# Patient Record
Sex: Male | Born: 1955 | Race: White | Hispanic: No | Marital: Single | Smoking: Current some day smoker
Health system: Southern US, Community
[De-identification: ages and names within clinical notes are randomized; demographics above are authoritative.]

## PROBLEM LIST (undated history)

## (undated) ENCOUNTER — Emergency Department (HOSPITAL_COMMUNITY): Discharge status: Against Medical Advice | Payer: Medicaid Other

## (undated) ENCOUNTER — Emergency Department (HOSPITAL_COMMUNITY)
Discharge status: Against Medical Advice | Payer: Medicaid Other | Attending: Emergency Medicine | Admitting: Emergency Medicine

## (undated) DIAGNOSIS — F1020 Alcohol dependence, uncomplicated: Secondary | ICD-10-CM

## (undated) DIAGNOSIS — IMO0002 Reserved for concepts with insufficient information to code with codable children: Secondary | ICD-10-CM

## (undated) DIAGNOSIS — F32A Depression, unspecified: Secondary | ICD-10-CM

## (undated) DIAGNOSIS — M869 Osteomyelitis, unspecified: Secondary | ICD-10-CM

## (undated) DIAGNOSIS — G20A1 Parkinson's disease without dyskinesia, without mention of fluctuations: Secondary | ICD-10-CM

## (undated) DIAGNOSIS — B1920 Unspecified viral hepatitis C without hepatic coma: Secondary | ICD-10-CM

## (undated) DIAGNOSIS — I10 Essential (primary) hypertension: Secondary | ICD-10-CM

## (undated) DIAGNOSIS — I2510 Atherosclerotic heart disease of native coronary artery without angina pectoris: Secondary | ICD-10-CM

## (undated) DIAGNOSIS — G8929 Other chronic pain: Secondary | ICD-10-CM

## (undated) DIAGNOSIS — G20 Parkinson's disease: Secondary | ICD-10-CM

## (undated) DIAGNOSIS — F329 Major depressive disorder, single episode, unspecified: Secondary | ICD-10-CM

## (undated) DIAGNOSIS — M726 Necrotizing fasciitis: Secondary | ICD-10-CM

## (undated) DIAGNOSIS — F99 Mental disorder, not otherwise specified: Secondary | ICD-10-CM

## (undated) DIAGNOSIS — M1990 Unspecified osteoarthritis, unspecified site: Secondary | ICD-10-CM

## (undated) DIAGNOSIS — E7800 Pure hypercholesterolemia, unspecified: Secondary | ICD-10-CM

## (undated) DIAGNOSIS — F419 Anxiety disorder, unspecified: Secondary | ICD-10-CM

## (undated) HISTORY — PX: APPENDECTOMY: SHX54

---
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
## 1999-08-14 ENCOUNTER — Emergency Department (HOSPITAL_COMMUNITY): Admission: EM | Admit: 1999-08-14 | Discharge: 1999-08-14 | Payer: Self-pay | Admitting: Emergency Medicine

## 1999-08-14 ENCOUNTER — Encounter: Payer: Self-pay | Admitting: Emergency Medicine

## 2000-06-15 ENCOUNTER — Inpatient Hospital Stay (HOSPITAL_COMMUNITY): Admission: EM | Admit: 2000-06-15 | Discharge: 2000-06-16 | Payer: Self-pay | Admitting: Emergency Medicine

## 2000-06-15 ENCOUNTER — Encounter (INDEPENDENT_AMBULATORY_CARE_PROVIDER_SITE_OTHER): Payer: Self-pay | Admitting: *Deleted

## 2000-06-15 ENCOUNTER — Encounter: Payer: Self-pay | Admitting: Emergency Medicine

## 2000-09-05 ENCOUNTER — Emergency Department (HOSPITAL_COMMUNITY): Admission: EM | Admit: 2000-09-05 | Discharge: 2000-09-05 | Payer: Self-pay | Admitting: Emergency Medicine

## 2000-09-05 ENCOUNTER — Encounter: Payer: Self-pay | Admitting: Emergency Medicine

## 2002-10-29 ENCOUNTER — Emergency Department (HOSPITAL_COMMUNITY): Admission: EM | Admit: 2002-10-29 | Discharge: 2002-10-29 | Payer: Self-pay | Admitting: Emergency Medicine

## 2003-01-19 ENCOUNTER — Emergency Department (HOSPITAL_COMMUNITY): Admission: EM | Admit: 2003-01-19 | Discharge: 2003-01-19 | Payer: Self-pay

## 2005-03-10 ENCOUNTER — Emergency Department (HOSPITAL_COMMUNITY): Admission: EM | Admit: 2005-03-10 | Discharge: 2005-03-10 | Payer: Self-pay | Admitting: Emergency Medicine

## 2005-03-12 ENCOUNTER — Ambulatory Visit: Payer: Self-pay | Admitting: *Deleted

## 2005-03-12 ENCOUNTER — Ambulatory Visit: Payer: Self-pay | Admitting: Nurse Practitioner

## 2005-03-26 ENCOUNTER — Ambulatory Visit: Payer: Self-pay | Admitting: Nurse Practitioner

## 2005-08-02 ENCOUNTER — Emergency Department (HOSPITAL_COMMUNITY): Admission: EM | Admit: 2005-08-02 | Discharge: 2005-08-02 | Payer: Self-pay | Admitting: Emergency Medicine

## 2005-12-21 ENCOUNTER — Emergency Department (HOSPITAL_COMMUNITY): Admission: EM | Admit: 2005-12-21 | Discharge: 2005-12-21 | Payer: Self-pay | Admitting: Emergency Medicine

## 2005-12-30 ENCOUNTER — Emergency Department (HOSPITAL_COMMUNITY): Admission: EM | Admit: 2005-12-30 | Discharge: 2005-12-30 | Payer: Self-pay | Admitting: Emergency Medicine

## 2005-12-31 ENCOUNTER — Ambulatory Visit: Payer: Self-pay | Admitting: Nurse Practitioner

## 2006-01-12 ENCOUNTER — Ambulatory Visit: Payer: Self-pay | Admitting: Nurse Practitioner

## 2006-01-21 ENCOUNTER — Ambulatory Visit (HOSPITAL_COMMUNITY): Admission: RE | Admit: 2006-01-21 | Discharge: 2006-01-21 | Payer: Self-pay | Admitting: Internal Medicine

## 2006-01-25 ENCOUNTER — Ambulatory Visit: Payer: Self-pay | Admitting: Nurse Practitioner

## 2006-02-10 ENCOUNTER — Emergency Department (HOSPITAL_COMMUNITY): Admission: EM | Admit: 2006-02-10 | Discharge: 2006-02-10 | Payer: Self-pay | Admitting: Emergency Medicine

## 2006-02-23 ENCOUNTER — Ambulatory Visit: Payer: Self-pay | Admitting: Nurse Practitioner

## 2006-03-21 ENCOUNTER — Ambulatory Visit: Payer: Self-pay | Admitting: Nurse Practitioner

## 2006-03-31 ENCOUNTER — Ambulatory Visit: Payer: Self-pay | Admitting: Nurse Practitioner

## 2006-05-02 ENCOUNTER — Ambulatory Visit: Payer: Self-pay | Admitting: Nurse Practitioner

## 2006-06-01 ENCOUNTER — Ambulatory Visit: Payer: Self-pay | Admitting: Nurse Practitioner

## 2006-07-05 ENCOUNTER — Ambulatory Visit: Payer: Self-pay | Admitting: Nurse Practitioner

## 2006-07-17 ENCOUNTER — Emergency Department (HOSPITAL_COMMUNITY): Admission: EM | Admit: 2006-07-17 | Discharge: 2006-07-17 | Payer: Self-pay | Admitting: Emergency Medicine

## 2006-08-01 ENCOUNTER — Ambulatory Visit: Payer: Self-pay | Admitting: Nurse Practitioner

## 2006-08-05 ENCOUNTER — Emergency Department (HOSPITAL_COMMUNITY): Admission: EM | Admit: 2006-08-05 | Discharge: 2006-08-05 | Payer: Self-pay | Admitting: Emergency Medicine

## 2006-09-23 ENCOUNTER — Ambulatory Visit: Payer: Self-pay | Admitting: Nurse Practitioner

## 2006-10-07 ENCOUNTER — Emergency Department (HOSPITAL_COMMUNITY): Admission: EM | Admit: 2006-10-07 | Discharge: 2006-10-07 | Payer: Self-pay | Admitting: Emergency Medicine

## 2006-10-08 ENCOUNTER — Emergency Department (HOSPITAL_COMMUNITY): Admission: EM | Admit: 2006-10-08 | Discharge: 2006-10-08 | Payer: Self-pay | Admitting: Emergency Medicine

## 2006-10-21 ENCOUNTER — Ambulatory Visit: Payer: Self-pay | Admitting: Nurse Practitioner

## 2006-10-30 ENCOUNTER — Emergency Department (HOSPITAL_COMMUNITY): Admission: EM | Admit: 2006-10-30 | Discharge: 2006-10-30 | Payer: Self-pay | Admitting: Emergency Medicine

## 2006-10-31 ENCOUNTER — Emergency Department (HOSPITAL_COMMUNITY): Admission: EM | Admit: 2006-10-31 | Discharge: 2006-10-31 | Payer: Self-pay | Admitting: Emergency Medicine

## 2006-11-03 ENCOUNTER — Ambulatory Visit: Payer: Self-pay | Admitting: Nurse Practitioner

## 2006-12-13 ENCOUNTER — Emergency Department (HOSPITAL_COMMUNITY): Admission: EM | Admit: 2006-12-13 | Discharge: 2006-12-13 | Payer: Self-pay | Admitting: Emergency Medicine

## 2006-12-21 ENCOUNTER — Ambulatory Visit: Payer: Self-pay | Admitting: Nurse Practitioner

## 2007-02-20 ENCOUNTER — Ambulatory Visit: Payer: Self-pay | Admitting: Internal Medicine

## 2007-03-08 ENCOUNTER — Ambulatory Visit: Payer: Self-pay | Admitting: Internal Medicine

## 2007-04-04 ENCOUNTER — Encounter (INDEPENDENT_AMBULATORY_CARE_PROVIDER_SITE_OTHER): Payer: Self-pay | Admitting: Nurse Practitioner

## 2007-04-04 ENCOUNTER — Ambulatory Visit: Payer: Self-pay | Admitting: Internal Medicine

## 2007-04-04 LAB — CONVERTED CEMR LAB
ALT: 44 units/L (ref 0–53)
AST: 147 units/L — ABNORMAL HIGH (ref 0–37)
Alkaline Phosphatase: 156 units/L — ABNORMAL HIGH (ref 39–117)
Chloride: 106 meq/L (ref 96–112)
Creatinine, Ser: 0.85 mg/dL (ref 0.40–1.50)
Total Bilirubin: 1.4 mg/dL — ABNORMAL HIGH (ref 0.3–1.2)

## 2007-04-16 ENCOUNTER — Emergency Department (HOSPITAL_COMMUNITY): Admission: EM | Admit: 2007-04-16 | Discharge: 2007-04-16 | Payer: Self-pay | Admitting: Emergency Medicine

## 2007-05-13 ENCOUNTER — Emergency Department (HOSPITAL_COMMUNITY): Admission: EM | Admit: 2007-05-13 | Discharge: 2007-05-14 | Payer: Self-pay | Admitting: Emergency Medicine

## 2007-05-24 ENCOUNTER — Encounter (INDEPENDENT_AMBULATORY_CARE_PROVIDER_SITE_OTHER): Payer: Self-pay | Admitting: *Deleted

## 2007-05-30 ENCOUNTER — Ambulatory Visit: Payer: Self-pay | Admitting: Internal Medicine

## 2007-06-29 ENCOUNTER — Encounter (INDEPENDENT_AMBULATORY_CARE_PROVIDER_SITE_OTHER): Payer: Self-pay | Admitting: Nurse Practitioner

## 2007-06-29 ENCOUNTER — Ambulatory Visit: Payer: Self-pay | Admitting: Internal Medicine

## 2007-06-29 LAB — CONVERTED CEMR LAB
Amphetamine Screen, Ur: NEGATIVE
Barbiturate Quant, Ur: NEGATIVE
Marijuana Metabolite: NEGATIVE
Methadone: NEGATIVE
Opiate Screen, Urine: NEGATIVE

## 2007-07-04 ENCOUNTER — Emergency Department (HOSPITAL_COMMUNITY): Admission: EM | Admit: 2007-07-04 | Discharge: 2007-07-04 | Payer: Self-pay | Admitting: Emergency Medicine

## 2007-07-05 ENCOUNTER — Emergency Department (HOSPITAL_COMMUNITY): Admission: EM | Admit: 2007-07-05 | Discharge: 2007-07-05 | Payer: Self-pay | Admitting: Emergency Medicine

## 2007-08-30 ENCOUNTER — Emergency Department (HOSPITAL_COMMUNITY): Admission: EM | Admit: 2007-08-30 | Discharge: 2007-08-31 | Payer: Self-pay | Admitting: Emergency Medicine

## 2007-09-05 ENCOUNTER — Emergency Department (HOSPITAL_COMMUNITY): Admission: EM | Admit: 2007-09-05 | Discharge: 2007-09-05 | Payer: Self-pay | Admitting: Emergency Medicine

## 2007-09-06 ENCOUNTER — Inpatient Hospital Stay (HOSPITAL_COMMUNITY): Admission: EM | Admit: 2007-09-06 | Discharge: 2007-09-25 | Payer: Self-pay | Admitting: Emergency Medicine

## 2007-09-26 ENCOUNTER — Emergency Department (HOSPITAL_COMMUNITY): Admission: EM | Admit: 2007-09-26 | Discharge: 2007-09-26 | Payer: Self-pay | Admitting: Emergency Medicine

## 2007-09-28 ENCOUNTER — Emergency Department (HOSPITAL_COMMUNITY): Admission: EM | Admit: 2007-09-28 | Discharge: 2007-09-28 | Payer: Self-pay | Admitting: *Deleted

## 2007-10-04 ENCOUNTER — Ambulatory Visit: Payer: Self-pay | Admitting: Family Medicine

## 2007-11-01 ENCOUNTER — Ambulatory Visit: Payer: Self-pay | Admitting: Internal Medicine

## 2008-04-24 ENCOUNTER — Ambulatory Visit: Payer: Self-pay | Admitting: Internal Medicine

## 2008-04-24 LAB — CONVERTED CEMR LAB
AST: 134 units/L — ABNORMAL HIGH (ref 0–37)
Albumin: 3.1 g/dL — ABNORMAL LOW (ref 3.5–5.2)
Alkaline Phosphatase: 175 units/L — ABNORMAL HIGH (ref 39–117)
BUN: 13 mg/dL (ref 6–23)
Basophils Relative: 1 % (ref 0–1)
Eosinophils Absolute: 0.1 10*3/uL (ref 0.0–0.7)
Eosinophils Relative: 2 % (ref 0–5)
MCHC: 32.7 g/dL (ref 30.0–36.0)
MCV: 98.4 fL (ref 78.0–100.0)
Neutrophils Relative %: 45 % (ref 43–77)
Platelets: 72 10*3/uL — ABNORMAL LOW (ref 150–400)
Potassium: 3.8 meq/L (ref 3.5–5.3)
Sodium: 140 meq/L (ref 135–145)
Total Bilirubin: 1.2 mg/dL (ref 0.3–1.2)

## 2008-06-14 ENCOUNTER — Ambulatory Visit: Payer: Self-pay | Admitting: Internal Medicine

## 2008-06-14 LAB — CONVERTED CEMR LAB
Amphetamine Screen, Ur: NEGATIVE
Marijuana Metabolite: NEGATIVE
Opiate Screen, Urine: NEGATIVE
Phencyclidine (PCP): NEGATIVE
Propoxyphene: NEGATIVE

## 2008-06-15 ENCOUNTER — Encounter (INDEPENDENT_AMBULATORY_CARE_PROVIDER_SITE_OTHER): Payer: Self-pay | Admitting: Internal Medicine

## 2008-11-04 ENCOUNTER — Emergency Department (HOSPITAL_COMMUNITY): Admission: EM | Admit: 2008-11-04 | Discharge: 2008-11-04 | Payer: Self-pay | Admitting: *Deleted

## 2008-12-05 ENCOUNTER — Emergency Department (HOSPITAL_COMMUNITY): Admission: EM | Admit: 2008-12-05 | Discharge: 2008-12-06 | Payer: Self-pay | Admitting: Emergency Medicine

## 2008-12-22 ENCOUNTER — Emergency Department (HOSPITAL_COMMUNITY): Admission: EM | Admit: 2008-12-22 | Discharge: 2008-12-23 | Payer: Self-pay | Admitting: *Deleted

## 2008-12-23 ENCOUNTER — Ambulatory Visit: Payer: Self-pay | Admitting: Psychiatry

## 2008-12-23 ENCOUNTER — Inpatient Hospital Stay (HOSPITAL_COMMUNITY): Admission: EM | Admit: 2008-12-23 | Discharge: 2008-12-31 | Payer: Self-pay | Admitting: Psychiatry

## 2008-12-27 ENCOUNTER — Emergency Department (HOSPITAL_COMMUNITY): Admission: EM | Admit: 2008-12-27 | Discharge: 2008-12-27 | Payer: Self-pay | Admitting: Emergency Medicine

## 2009-01-02 ENCOUNTER — Emergency Department (HOSPITAL_COMMUNITY): Admission: EM | Admit: 2009-01-02 | Discharge: 2009-01-02 | Payer: Self-pay | Admitting: Emergency Medicine

## 2009-01-02 ENCOUNTER — Inpatient Hospital Stay (HOSPITAL_COMMUNITY): Admission: RE | Admit: 2009-01-02 | Discharge: 2009-01-08 | Payer: Self-pay | Admitting: Psychiatry

## 2009-01-08 ENCOUNTER — Emergency Department (HOSPITAL_COMMUNITY): Admission: EM | Admit: 2009-01-08 | Discharge: 2009-01-09 | Payer: Self-pay | Admitting: Emergency Medicine

## 2009-01-09 ENCOUNTER — Inpatient Hospital Stay (HOSPITAL_COMMUNITY): Admission: RE | Admit: 2009-01-09 | Discharge: 2009-01-15 | Payer: Self-pay | Admitting: Psychiatry

## 2009-01-24 ENCOUNTER — Emergency Department (HOSPITAL_COMMUNITY): Admission: EM | Admit: 2009-01-24 | Discharge: 2009-01-24 | Payer: Self-pay | Admitting: Emergency Medicine

## 2009-01-31 ENCOUNTER — Emergency Department (HOSPITAL_COMMUNITY): Admission: EM | Admit: 2009-01-31 | Discharge: 2009-02-04 | Payer: Self-pay | Admitting: Emergency Medicine

## 2009-02-04 ENCOUNTER — Inpatient Hospital Stay (HOSPITAL_COMMUNITY): Admission: AD | Admit: 2009-02-04 | Discharge: 2009-02-13 | Payer: Self-pay | Admitting: Psychiatry

## 2009-02-04 ENCOUNTER — Ambulatory Visit: Payer: Self-pay | Admitting: Psychiatry

## 2009-02-08 ENCOUNTER — Emergency Department (HOSPITAL_COMMUNITY): Admission: EM | Admit: 2009-02-08 | Discharge: 2009-02-08 | Payer: Self-pay | Admitting: Emergency Medicine

## 2009-02-18 ENCOUNTER — Emergency Department (HOSPITAL_COMMUNITY): Admission: EM | Admit: 2009-02-18 | Discharge: 2009-02-18 | Payer: Self-pay | Admitting: Emergency Medicine

## 2009-02-19 ENCOUNTER — Ambulatory Visit: Payer: Self-pay | Admitting: Psychiatry

## 2009-02-21 ENCOUNTER — Emergency Department (HOSPITAL_COMMUNITY): Admission: EM | Admit: 2009-02-21 | Discharge: 2009-02-22 | Payer: Self-pay | Admitting: Emergency Medicine

## 2009-02-22 ENCOUNTER — Inpatient Hospital Stay (HOSPITAL_COMMUNITY): Admission: RE | Admit: 2009-02-22 | Discharge: 2009-02-26 | Payer: Self-pay | Admitting: Psychiatry

## 2009-02-27 ENCOUNTER — Emergency Department (HOSPITAL_COMMUNITY): Admission: EM | Admit: 2009-02-27 | Discharge: 2009-02-27 | Payer: Self-pay | Admitting: Emergency Medicine

## 2009-03-03 ENCOUNTER — Emergency Department (HOSPITAL_COMMUNITY): Admission: EM | Admit: 2009-03-03 | Discharge: 2009-03-03 | Payer: Self-pay | Admitting: Emergency Medicine

## 2009-03-25 ENCOUNTER — Observation Stay (HOSPITAL_COMMUNITY): Admission: EM | Admit: 2009-03-25 | Discharge: 2009-03-26 | Payer: Self-pay | Admitting: Emergency Medicine

## 2009-04-01 ENCOUNTER — Emergency Department (HOSPITAL_COMMUNITY): Admission: EM | Admit: 2009-04-01 | Discharge: 2009-04-01 | Payer: Self-pay | Admitting: Emergency Medicine

## 2009-04-06 HISTORY — PX: HIP SURGERY: SHX245

## 2009-04-15 ENCOUNTER — Inpatient Hospital Stay (HOSPITAL_COMMUNITY): Admission: EM | Admit: 2009-04-15 | Discharge: 2009-05-08 | Payer: Self-pay | Admitting: Emergency Medicine

## 2009-04-15 ENCOUNTER — Ambulatory Visit: Payer: Self-pay | Admitting: Emergency Medicine

## 2009-05-08 ENCOUNTER — Encounter: Payer: Self-pay | Admitting: Family Medicine

## 2009-05-08 ENCOUNTER — Ambulatory Visit: Payer: Self-pay | Admitting: Family Medicine

## 2009-05-08 DIAGNOSIS — I10 Essential (primary) hypertension: Secondary | ICD-10-CM

## 2009-05-08 DIAGNOSIS — G4700 Insomnia, unspecified: Secondary | ICD-10-CM | POA: Insufficient documentation

## 2009-05-08 DIAGNOSIS — F1910 Other psychoactive substance abuse, uncomplicated: Secondary | ICD-10-CM

## 2009-05-08 DIAGNOSIS — M48061 Spinal stenosis, lumbar region without neurogenic claudication: Secondary | ICD-10-CM

## 2009-05-08 DIAGNOSIS — M549 Dorsalgia, unspecified: Secondary | ICD-10-CM

## 2009-05-08 DIAGNOSIS — E039 Hypothyroidism, unspecified: Secondary | ICD-10-CM | POA: Insufficient documentation

## 2009-05-08 DIAGNOSIS — F341 Dysthymic disorder: Secondary | ICD-10-CM

## 2009-05-08 DIAGNOSIS — D638 Anemia in other chronic diseases classified elsewhere: Secondary | ICD-10-CM

## 2009-05-08 DIAGNOSIS — R188 Other ascites: Secondary | ICD-10-CM

## 2009-05-08 DIAGNOSIS — Z8619 Personal history of other infectious and parasitic diseases: Secondary | ICD-10-CM

## 2009-05-08 DIAGNOSIS — F1010 Alcohol abuse, uncomplicated: Secondary | ICD-10-CM | POA: Insufficient documentation

## 2009-05-08 DIAGNOSIS — E8809 Other disorders of plasma-protein metabolism, not elsewhere classified: Secondary | ICD-10-CM

## 2009-05-08 DIAGNOSIS — K7030 Alcoholic cirrhosis of liver without ascites: Secondary | ICD-10-CM

## 2009-05-08 DIAGNOSIS — M479 Spondylosis, unspecified: Secondary | ICD-10-CM | POA: Insufficient documentation

## 2009-05-11 ENCOUNTER — Telehealth: Payer: Self-pay | Admitting: Family Medicine

## 2009-05-12 ENCOUNTER — Telehealth: Payer: Self-pay | Admitting: Family Medicine

## 2009-05-30 ENCOUNTER — Encounter: Payer: Self-pay | Admitting: Family Medicine

## 2009-06-03 ENCOUNTER — Inpatient Hospital Stay (HOSPITAL_COMMUNITY): Admission: RE | Admit: 2009-06-03 | Discharge: 2009-06-06 | Payer: Self-pay | Admitting: Orthopedic Surgery

## 2009-06-11 ENCOUNTER — Telehealth: Payer: Self-pay | Admitting: Family Medicine

## 2009-06-16 ENCOUNTER — Encounter: Payer: Self-pay | Admitting: Family Medicine

## 2009-06-18 ENCOUNTER — Encounter: Payer: Self-pay | Admitting: Family Medicine

## 2009-06-23 ENCOUNTER — Encounter: Payer: Self-pay | Admitting: Family Medicine

## 2009-06-25 ENCOUNTER — Inpatient Hospital Stay (HOSPITAL_COMMUNITY): Admission: RE | Admit: 2009-06-25 | Discharge: 2009-06-30 | Payer: Self-pay | Admitting: Orthopedic Surgery

## 2009-06-30 ENCOUNTER — Telehealth: Payer: Self-pay | Admitting: Family Medicine

## 2009-07-09 ENCOUNTER — Encounter: Payer: Self-pay | Admitting: Family Medicine

## 2009-07-23 ENCOUNTER — Encounter (INDEPENDENT_AMBULATORY_CARE_PROVIDER_SITE_OTHER): Payer: Self-pay | Admitting: Pharmacist

## 2009-07-23 ENCOUNTER — Encounter: Payer: Self-pay | Admitting: Family Medicine

## 2009-07-23 ENCOUNTER — Ambulatory Visit: Payer: Self-pay | Admitting: Family Medicine

## 2009-07-23 DIAGNOSIS — M1610 Unilateral primary osteoarthritis, unspecified hip: Secondary | ICD-10-CM | POA: Insufficient documentation

## 2009-07-23 DIAGNOSIS — M169 Osteoarthritis of hip, unspecified: Secondary | ICD-10-CM

## 2009-07-30 ENCOUNTER — Encounter: Payer: Self-pay | Admitting: Family Medicine

## 2009-07-30 DIAGNOSIS — F319 Bipolar disorder, unspecified: Secondary | ICD-10-CM

## 2009-08-01 ENCOUNTER — Telehealth: Payer: Self-pay | Admitting: Family Medicine

## 2009-08-02 ENCOUNTER — Telehealth: Payer: Self-pay | Admitting: Family Medicine

## 2009-08-04 ENCOUNTER — Encounter: Payer: Self-pay | Admitting: Family Medicine

## 2009-08-06 ENCOUNTER — Encounter (INDEPENDENT_AMBULATORY_CARE_PROVIDER_SITE_OTHER): Payer: Self-pay | Admitting: Pharmacist

## 2009-08-07 ENCOUNTER — Telehealth: Payer: Self-pay | Admitting: Family Medicine

## 2009-08-07 ENCOUNTER — Encounter: Payer: Self-pay | Admitting: Family Medicine

## 2009-08-23 ENCOUNTER — Telehealth: Payer: Self-pay | Admitting: Family Medicine

## 2009-08-24 ENCOUNTER — Telehealth: Payer: Self-pay | Admitting: Family Medicine

## 2009-08-24 ENCOUNTER — Emergency Department (HOSPITAL_COMMUNITY): Admission: EM | Admit: 2009-08-24 | Discharge: 2009-08-24 | Payer: Self-pay | Admitting: Emergency Medicine

## 2009-08-28 ENCOUNTER — Telehealth: Payer: Self-pay | Admitting: Family Medicine

## 2009-09-08 ENCOUNTER — Ambulatory Visit: Payer: Self-pay | Admitting: Family Medicine

## 2009-09-08 ENCOUNTER — Encounter: Payer: Self-pay | Admitting: Family Medicine

## 2009-09-08 DIAGNOSIS — R32 Unspecified urinary incontinence: Secondary | ICD-10-CM | POA: Insufficient documentation

## 2009-09-12 ENCOUNTER — Ambulatory Visit: Payer: Self-pay | Admitting: Vascular Surgery

## 2009-09-12 ENCOUNTER — Emergency Department (HOSPITAL_COMMUNITY): Admission: EM | Admit: 2009-09-12 | Discharge: 2009-09-13 | Payer: Self-pay | Admitting: Emergency Medicine

## 2009-09-12 ENCOUNTER — Encounter (INDEPENDENT_AMBULATORY_CARE_PROVIDER_SITE_OTHER): Payer: Self-pay | Admitting: Emergency Medicine

## 2009-09-13 ENCOUNTER — Ambulatory Visit: Payer: Self-pay | Admitting: Psychiatry

## 2009-09-13 ENCOUNTER — Inpatient Hospital Stay (HOSPITAL_COMMUNITY): Admission: AD | Admit: 2009-09-13 | Discharge: 2009-09-17 | Payer: Self-pay | Admitting: Psychiatry

## 2009-09-19 ENCOUNTER — Encounter: Payer: Self-pay | Admitting: Family Medicine

## 2009-09-21 ENCOUNTER — Encounter: Payer: Self-pay | Admitting: Family Medicine

## 2009-09-24 ENCOUNTER — Encounter: Payer: Self-pay | Admitting: Family Medicine

## 2009-09-24 ENCOUNTER — Encounter (INDEPENDENT_AMBULATORY_CARE_PROVIDER_SITE_OTHER): Payer: Self-pay | Admitting: Pharmacist

## 2009-09-24 DIAGNOSIS — F10950 Alcohol use, unspecified with alcohol-induced psychotic disorder with delusions: Secondary | ICD-10-CM

## 2009-09-29 ENCOUNTER — Telehealth: Payer: Self-pay | Admitting: Family Medicine

## 2009-09-30 ENCOUNTER — Emergency Department (HOSPITAL_COMMUNITY): Admission: EM | Admit: 2009-09-30 | Discharge: 2009-09-30 | Payer: Self-pay | Admitting: Emergency Medicine

## 2009-09-30 ENCOUNTER — Telehealth: Payer: Self-pay | Admitting: Family Medicine

## 2009-09-30 ENCOUNTER — Ambulatory Visit: Payer: Self-pay | Admitting: Psychiatry

## 2009-09-30 ENCOUNTER — Inpatient Hospital Stay (HOSPITAL_COMMUNITY): Admission: AD | Admit: 2009-09-30 | Discharge: 2009-10-08 | Payer: Self-pay | Admitting: Psychiatry

## 2009-10-01 ENCOUNTER — Emergency Department (HOSPITAL_COMMUNITY): Admission: EM | Admit: 2009-10-01 | Discharge: 2009-10-01 | Payer: Self-pay | Admitting: Emergency Medicine

## 2009-10-04 ENCOUNTER — Ambulatory Visit (HOSPITAL_COMMUNITY): Admission: RE | Admit: 2009-10-04 | Discharge: 2009-10-04 | Payer: Self-pay | Admitting: Psychiatry

## 2009-10-08 ENCOUNTER — Telehealth: Payer: Self-pay | Admitting: Family Medicine

## 2009-10-09 ENCOUNTER — Telehealth: Payer: Self-pay | Admitting: Family Medicine

## 2009-10-09 ENCOUNTER — Encounter: Payer: Self-pay | Admitting: Family Medicine

## 2009-10-13 ENCOUNTER — Ambulatory Visit: Payer: Self-pay | Admitting: Family Medicine

## 2009-10-13 ENCOUNTER — Encounter: Payer: Self-pay | Admitting: Family Medicine

## 2009-10-15 ENCOUNTER — Encounter: Payer: Self-pay | Admitting: Family Medicine

## 2009-11-05 ENCOUNTER — Encounter (INDEPENDENT_AMBULATORY_CARE_PROVIDER_SITE_OTHER): Payer: Self-pay | Admitting: Pharmacist

## 2009-11-05 ENCOUNTER — Encounter: Payer: Self-pay | Admitting: Family Medicine

## 2009-11-11 ENCOUNTER — Encounter: Payer: Self-pay | Admitting: Family Medicine

## 2009-11-11 DIAGNOSIS — L03119 Cellulitis of unspecified part of limb: Secondary | ICD-10-CM

## 2009-11-11 DIAGNOSIS — L02419 Cutaneous abscess of limb, unspecified: Secondary | ICD-10-CM

## 2009-11-18 ENCOUNTER — Encounter: Payer: Self-pay | Admitting: Family Medicine

## 2009-11-20 ENCOUNTER — Encounter: Payer: Self-pay | Admitting: Family Medicine

## 2009-11-20 DIAGNOSIS — M1710 Unilateral primary osteoarthritis, unspecified knee: Secondary | ICD-10-CM

## 2009-11-26 ENCOUNTER — Encounter (INDEPENDENT_AMBULATORY_CARE_PROVIDER_SITE_OTHER): Payer: Self-pay | Admitting: Pharmacist

## 2009-12-03 ENCOUNTER — Encounter (INDEPENDENT_AMBULATORY_CARE_PROVIDER_SITE_OTHER): Payer: Self-pay | Admitting: Pharmacist

## 2009-12-05 ENCOUNTER — Telehealth: Payer: Self-pay | Admitting: Family Medicine

## 2009-12-18 ENCOUNTER — Encounter (INDEPENDENT_AMBULATORY_CARE_PROVIDER_SITE_OTHER): Payer: Self-pay | Admitting: Pharmacist

## 2010-01-05 ENCOUNTER — Encounter: Payer: Self-pay | Admitting: Family Medicine

## 2010-01-06 ENCOUNTER — Emergency Department (HOSPITAL_COMMUNITY): Admission: EM | Admit: 2010-01-06 | Discharge: 2010-01-06 | Payer: Self-pay | Admitting: Emergency Medicine

## 2010-01-07 ENCOUNTER — Encounter (INDEPENDENT_AMBULATORY_CARE_PROVIDER_SITE_OTHER): Payer: Self-pay | Admitting: Pharmacist

## 2010-01-13 ENCOUNTER — Encounter: Payer: Self-pay | Admitting: Family Medicine

## 2010-01-13 ENCOUNTER — Ambulatory Visit: Payer: Self-pay | Admitting: Family Medicine

## 2010-01-13 DIAGNOSIS — M87059 Idiopathic aseptic necrosis of unspecified femur: Secondary | ICD-10-CM

## 2010-01-13 DIAGNOSIS — K029 Dental caries, unspecified: Secondary | ICD-10-CM | POA: Insufficient documentation

## 2010-02-11 ENCOUNTER — Encounter (INDEPENDENT_AMBULATORY_CARE_PROVIDER_SITE_OTHER): Payer: Self-pay | Admitting: Pharmacist

## 2010-03-04 ENCOUNTER — Encounter (INDEPENDENT_AMBULATORY_CARE_PROVIDER_SITE_OTHER): Payer: Self-pay | Admitting: Pharmacist

## 2010-03-04 ENCOUNTER — Encounter: Payer: Self-pay | Admitting: Family Medicine

## 2010-03-04 DIAGNOSIS — E669 Obesity, unspecified: Secondary | ICD-10-CM

## 2010-03-12 ENCOUNTER — Encounter: Payer: Self-pay | Admitting: Family Medicine

## 2010-03-18 ENCOUNTER — Encounter: Payer: Self-pay | Admitting: Family Medicine

## 2010-03-31 ENCOUNTER — Encounter: Payer: Self-pay | Admitting: Family Medicine

## 2010-03-31 DIAGNOSIS — F17200 Nicotine dependence, unspecified, uncomplicated: Secondary | ICD-10-CM

## 2010-04-02 ENCOUNTER — Encounter: Payer: Self-pay | Admitting: Family Medicine

## 2010-04-02 LAB — CONVERTED CEMR LAB
HDL: 31 mg/dL
LDL Cholesterol: 84 mg/dL
Triglycerides: 144 mg/dL
VLDL: 29 mg/dL

## 2010-04-06 ENCOUNTER — Encounter: Payer: Self-pay | Admitting: Family Medicine

## 2010-04-15 ENCOUNTER — Encounter (INDEPENDENT_AMBULATORY_CARE_PROVIDER_SITE_OTHER): Payer: Self-pay | Admitting: Pharmacist

## 2010-04-30 ENCOUNTER — Encounter: Payer: Self-pay | Admitting: Family Medicine

## 2010-04-30 ENCOUNTER — Ambulatory Visit: Payer: Self-pay | Admitting: Family Medicine

## 2010-05-18 ENCOUNTER — Encounter: Payer: Self-pay | Admitting: Family Medicine

## 2010-05-18 ENCOUNTER — Ambulatory Visit: Payer: Self-pay | Admitting: Family Medicine

## 2010-05-18 ENCOUNTER — Inpatient Hospital Stay (HOSPITAL_COMMUNITY): Admission: EM | Admit: 2010-05-18 | Discharge: 2010-06-01 | Payer: Self-pay | Admitting: Emergency Medicine

## 2010-05-27 ENCOUNTER — Telehealth: Payer: Self-pay | Admitting: Family Medicine

## 2010-06-16 ENCOUNTER — Encounter: Payer: Self-pay | Admitting: Family Medicine

## 2010-06-16 ENCOUNTER — Ambulatory Visit: Payer: Self-pay | Admitting: Family Medicine

## 2010-06-16 DIAGNOSIS — N179 Acute kidney failure, unspecified: Secondary | ICD-10-CM | POA: Insufficient documentation

## 2010-06-16 LAB — CONVERTED CEMR LAB
Calcium: 9.1 mg/dL (ref 8.4–10.5)
Glucose, Bld: 91 mg/dL (ref 70–99)
Potassium: 4.2 meq/L (ref 3.5–5.3)
Sodium: 140 meq/L (ref 135–145)

## 2010-06-22 ENCOUNTER — Emergency Department (HOSPITAL_COMMUNITY): Admission: EM | Admit: 2010-06-22 | Discharge: 2010-06-22 | Payer: Self-pay | Admitting: Emergency Medicine

## 2010-06-23 ENCOUNTER — Emergency Department (HOSPITAL_COMMUNITY): Admission: EM | Admit: 2010-06-23 | Discharge: 2010-06-24 | Payer: Self-pay | Admitting: Emergency Medicine

## 2010-06-23 ENCOUNTER — Emergency Department (HOSPITAL_COMMUNITY): Admission: EM | Admit: 2010-06-23 | Discharge: 2010-06-23 | Payer: Self-pay | Admitting: Emergency Medicine

## 2010-06-23 ENCOUNTER — Telehealth: Payer: Self-pay | Admitting: Family Medicine

## 2010-06-24 ENCOUNTER — Telehealth: Payer: Self-pay | Admitting: Family Medicine

## 2010-06-25 ENCOUNTER — Telehealth: Payer: Self-pay | Admitting: Family Medicine

## 2010-07-14 ENCOUNTER — Encounter: Payer: Self-pay | Admitting: Family Medicine

## 2010-07-21 ENCOUNTER — Encounter: Payer: Self-pay | Admitting: Family Medicine

## 2010-07-21 ENCOUNTER — Ambulatory Visit: Payer: Self-pay | Admitting: Family Medicine

## 2010-07-21 DIAGNOSIS — R3919 Other difficulties with micturition: Secondary | ICD-10-CM | POA: Insufficient documentation

## 2010-07-28 LAB — CONVERTED CEMR LAB
HCT: 37.1 % — ABNORMAL LOW (ref 39.0–52.0)
Hemoglobin: 11.7 g/dL — ABNORMAL LOW (ref 13.0–17.0)
MCV: 83 fL (ref 78.0–100.0)
RBC: 4.47 M/uL (ref 4.22–5.81)
WBC: 10.2 10*3/uL (ref 4.0–10.5)

## 2010-08-13 ENCOUNTER — Encounter: Payer: Self-pay | Admitting: *Deleted

## 2010-08-20 ENCOUNTER — Encounter: Payer: Self-pay | Admitting: Family Medicine

## 2010-08-20 ENCOUNTER — Emergency Department (HOSPITAL_COMMUNITY)
Admission: EM | Admit: 2010-08-20 | Discharge: 2010-08-20 | Payer: Self-pay | Source: Home / Self Care | Admitting: Emergency Medicine

## 2010-08-20 ENCOUNTER — Encounter (INDEPENDENT_AMBULATORY_CARE_PROVIDER_SITE_OTHER): Payer: Self-pay | Admitting: Emergency Medicine

## 2010-08-20 ENCOUNTER — Ambulatory Visit: Payer: Self-pay | Admitting: Family Medicine

## 2010-08-20 DIAGNOSIS — R609 Edema, unspecified: Secondary | ICD-10-CM

## 2010-08-20 LAB — CONVERTED CEMR LAB
Basophils Relative: 0 % (ref 0–1)
Eosinophils Absolute: 0 10*3/uL (ref 0.0–0.7)
HCT: 36.2 % — ABNORMAL LOW (ref 39.0–52.0)
Hemoglobin: 11.6 g/dL — ABNORMAL LOW (ref 13.0–17.0)
Lymphs Abs: 2.7 10*3/uL (ref 0.7–4.0)
MCHC: 32 g/dL (ref 30.0–36.0)
MCV: 81.5 fL (ref 78.0–100.0)
Monocytes Absolute: 0.9 10*3/uL (ref 0.1–1.0)
Monocytes Relative: 9 % (ref 3–12)
Neutrophils Relative %: 63 % (ref 43–77)
RBC: 4.44 M/uL (ref 4.22–5.81)
WBC: 9.7 10*3/uL (ref 4.0–10.5)

## 2010-08-21 ENCOUNTER — Encounter: Payer: Self-pay | Admitting: Family Medicine

## 2010-08-25 ENCOUNTER — Emergency Department (HOSPITAL_COMMUNITY)
Admission: EM | Admit: 2010-08-25 | Discharge: 2010-08-25 | Payer: Self-pay | Source: Home / Self Care | Admitting: Emergency Medicine

## 2010-09-14 ENCOUNTER — Telehealth: Payer: Self-pay | Admitting: Family Medicine

## 2010-09-14 ENCOUNTER — Encounter: Payer: Self-pay | Admitting: *Deleted

## 2010-10-05 ENCOUNTER — Encounter: Payer: Self-pay | Admitting: Family Medicine

## 2010-10-05 ENCOUNTER — Ambulatory Visit: Admission: RE | Admit: 2010-10-05 | Discharge: 2010-10-05 | Payer: Self-pay | Source: Home / Self Care

## 2010-10-05 LAB — TSH: TSH: 4.797 u[IU]/mL — ABNORMAL HIGH (ref 0.350–4.500)

## 2010-10-06 ENCOUNTER — Telehealth: Payer: Self-pay | Admitting: Family Medicine

## 2010-10-06 NOTE — Miscellaneous (Signed)
Summary: Med Update - Rx  Medications Added OXYCODONE HCL 5 MG TABS (OXYCODONE HCL) 1 tab by mouth Q6 hours as needed for pain SEROQUEL 100 MG TABS (QUETIAPINE FUMARATE) once daily at 4:00 PM SEROQUEL 50 MG TABS (QUETIAPINE FUMARATE) once daily in AM ALLOPURINOL 300 MG TABS (ALLOPURINOL) once daily EFFEXOR XR 75 MG XR24H-CAP (VENLAFAXINE HCL) once daily THIAMINE HCL 100 MG TABS (THIAMINE HCL) once daily ACETAMINOPHEN 650 MG  TBCR (ACETAMINOPHEN) three times a day as needed MEGACE ORAL 40 MG/ML SUSP (MEGESTROL ACETATE) 800mg  daily BISACODYL EC 5 MG TBEC (BISACODYL) once daily as needed       Clinical Lists Changes  Medications: Removed medication of HYDROXYZINE HCL 10 MG TABS (HYDROXYZINE HCL) q6 hours as needed Removed medication of PRISTIQ 100 MG XR24H-TAB (DESVENLAFAXINE SUCCINATE) one daily Removed medication of SEROQUEL 50 MG TABS (QUETIAPINE FUMARATE) Take one tab every at bedtime Removed medication of SEROQUEL 25 MG TABS (QUETIAPINE FUMARATE) Take one tab every AM Removed medication of VITAMIN C 500 MG TABS (ASCORBIC ACID) 1 two times a day Changed medication from OXYCODONE HCL 5 MG TABS (OXYCODONE HCL) 1 tab by mouth three times daily as needed for pain to OXYCODONE HCL 5 MG TABS (OXYCODONE HCL) 1 tab by mouth Q6 hours as needed for pain - Signed Added new medication of SEROQUEL 100 MG TABS (QUETIAPINE FUMARATE) once daily at 4:00 PM - Signed Added new medication of SEROQUEL 50 MG TABS (QUETIAPINE FUMARATE) once daily in AM - Signed Added new medication of ALLOPURINOL 300 MG TABS (ALLOPURINOL) once daily - Signed Added new medication of EFFEXOR XR 75 MG XR24H-CAP (VENLAFAXINE HCL) once daily - Signed Added new medication of THIAMINE HCL 100 MG TABS (THIAMINE HCL) once daily - Signed Added new medication of ACETAMINOPHEN 650 MG  TBCR (ACETAMINOPHEN) three times a day as needed - Signed Added new medication of MEGACE ORAL 40 MG/ML SUSP (MEGESTROL ACETATE) 800mg  daily -  Signed Added new medication of BISACODYL EC 5 MG TBEC (BISACODYL) once daily as needed - Signed Rx of OXYCODONE HCL 5 MG TABS (OXYCODONE HCL) 1 tab by mouth Q6 hours as needed for pain;  #1 x 0;  Signed;  Entered by: Christian Mate D;  Authorized by: Madelon Lips Pharm D;  Method used: Historical Rx of SEROQUEL 100 MG TABS (QUETIAPINE FUMARATE) once daily at 4:00 PM;  #1 x 0;  Signed;  Entered by: Christian Mate D;  Authorized by: Madelon Lips Pharm D;  Method used: Historical Rx of SEROQUEL 50 MG TABS (QUETIAPINE FUMARATE) once daily in AM;  #1 x 0;  Signed;  Entered by: Christian Mate D;  Authorized by: Madelon Lips Pharm D;  Method used: Historical Rx of ALLOPURINOL 300 MG TABS (ALLOPURINOL) once daily;  #1 x 0;  Signed;  Entered by: Christian Mate D;  Authorized by: Madelon Lips Pharm D;  Method used: Historical Rx of EFFEXOR XR 75 MG XR24H-CAP (VENLAFAXINE HCL) once daily;  #1 x 0;  Signed;  Entered by: Christian Mate D;  Authorized by: Madelon Lips Pharm D;  Method used: Historical Rx of THIAMINE HCL 100 MG TABS (THIAMINE HCL) once daily;  #1 x 0;  Signed;  Entered by: Christian Mate D;  Authorized by: Madelon Lips Pharm D;  Method used: Historical Rx of ACETAMINOPHEN 650 MG  TBCR (ACETAMINOPHEN) three times a day as needed;  #1 x 0;  Signed;  Entered by: Christian Mate D;  Authorized by: Madelon Lips  Pharm D;  Method used: Historical Rx of MEGACE ORAL 40 MG/ML SUSP (MEGESTROL ACETATE) 800mg  daily;  #1 x 0;  Signed;  Entered by: Christian Mate D;  Authorized by: Madelon Lips Pharm D;  Method used: Historical Rx of BISACODYL EC 5 MG TBEC (BISACODYL) once daily as needed;  #1 x 0;  Signed;  Entered by: Christian Mate D;  Authorized by: Madelon Lips Pharm D;  Method used: Historical    Prescriptions: BISACODYL EC 5 MG TBEC (BISACODYL) once daily as needed  #1 x 0   Entered and Authorized by:   Christian Mate D   Signed by:   Madelon Lips Pharm D on 09/24/2009   Method used:    Historical   RxID:   [id number] MEGACE ORAL 40 MG/ML SUSP (MEGESTROL ACETATE) 800mg  daily  #1 x 0   Entered and Authorized by:   Christian Mate D   Signed by:   Madelon Lips Pharm D on 09/24/2009   Method used:   Historical   RxID:   [id number] ACETAMINOPHEN 650 MG  TBCR (ACETAMINOPHEN) three times a day as needed  #1 x 0   Entered and Authorized by:   Christian Mate D   Signed by:   Madelon Lips Pharm D on 09/24/2009   Method used:   Historical   RxID:   [id number] THIAMINE HCL 100 MG TABS (THIAMINE HCL) once daily  #1 x 0   Entered and Authorized by:   Christian Mate D   Signed by:   Madelon Lips Pharm D on 09/24/2009   Method used:   Historical   RxID:   [id number] EFFEXOR XR 75 MG XR24H-CAP (VENLAFAXINE HCL) once daily  #1 x 0   Entered and Authorized by:   Christian Mate D   Signed by:   Madelon Lips Pharm D on 09/24/2009   Method used:   Historical   RxID:   [id number] ALLOPURINOL 300 MG TABS (ALLOPURINOL) once daily  #1 x 0   Entered and Authorized by:   Christian Mate D   Signed by:   Madelon Lips Pharm D on 09/24/2009   Method used:   Historical   RxID:   [id number] SEROQUEL 50 MG TABS (QUETIAPINE FUMARATE) once daily in AM  #1 x 0   Entered and Authorized by:   Christian Mate D   Signed by:   Madelon Lips Pharm D on 09/24/2009   Method used:   Historical   RxID:   [id number] SEROQUEL 100 MG TABS (QUETIAPINE FUMARATE) once daily at 4:00 PM  #1 x 0   Entered and Authorized by:   Christian Mate D   Signed by:   Madelon Lips Pharm D on 09/24/2009   Method used:   Historical   RxID:   [id number] OXYCODONE HCL 5 MG TABS (OXYCODONE HCL) 1 tab by mouth Q6 hours as needed for pain  #1 x 0   Entered and Authorized by:   Christian Mate D   Signed by:   Madelon Lips Pharm D on 09/24/2009   Method used:   Historical   RxID:   [id number]

## 2010-10-06 NOTE — Miscellaneous (Signed)
Summary: Med Update - Rx   Clinical Lists Changes  Medications: Removed medication of MIRTAZAPINE 15 MG TABS (MIRTAZAPINE) at bedtime Removed medication of THIAMINE HCL 100 MG TABS (THIAMINE HCL) once daily

## 2010-10-06 NOTE — Miscellaneous (Signed)
Summary: Med Update - Rx  Medications Added GEODON 80 MG CAPS (ZIPRASIDONE HCL) two times a day LEVOTHROID 125 MCG TABS (LEVOTHYROXINE SODIUM) once daily PERIDEX 0.12 % SOLN (CHLORHEXIDINE GLUCONATE) Swish and spit - 15 ml for 30 seconds  two times a day       Clinical Lists Changes  Medications: Removed medication of GEODON 60 MG CAPS (ZIPRASIDONE HCL) two times a day Added new medication of GEODON 80 MG CAPS (ZIPRASIDONE HCL) two times a day - Signed Removed medication of LEVOTHROID 150 MCG TABS (LEVOTHYROXINE SODIUM) one daily Added new medication of LEVOTHROID 125 MCG TABS (LEVOTHYROXINE SODIUM) once daily - Signed Added new medication of PERIDEX 0.12 % SOLN (CHLORHEXIDINE GLUCONATE) Swish and spit - 15 ml for 30 seconds  two times a day - Signed Rx of GEODON 80 MG CAPS (ZIPRASIDONE HCL) two times a day;  #1 x 0;  Signed;  Entered by: Christian Mate D;  Authorized by: Madelon Lips Pharm D;  Method used: Historical Rx of LEVOTHROID 125 MCG TABS (LEVOTHYROXINE SODIUM) once daily;  #1 x 0;  Signed;  Entered by: Christian Mate D;  Authorized by: Madelon Lips Pharm D;  Method used: Historical Rx of PERIDEX 0.12 % SOLN (CHLORHEXIDINE GLUCONATE) Swish and spit - 15 ml for 30 seconds  two times a day;  #1 x 0;  Signed;  Entered by: Christian Mate D;  Authorized by: Madelon Lips Pharm D;  Method used: Historical    Prescriptions: PERIDEX 0.12 % SOLN (CHLORHEXIDINE GLUCONATE) Swish and spit - 15 ml for 30 seconds  two times a day  #1 x 0   Entered and Authorized by:   Christian Mate D   Signed by:   Madelon Lips Pharm D on 04/15/2010   Method used:   Historical   RxID:   [id number] LEVOTHROID 125 MCG TABS (LEVOTHYROXINE SODIUM) once daily  #1 x 0   Entered and Authorized by:   Christian Mate D   Signed by:   Madelon Lips Pharm D on 04/15/2010   Method used:   Historical   RxID:   [id number] GEODON 80 MG CAPS (ZIPRASIDONE HCL) two times a day  #1 x 0   Entered and  Authorized by:   Christian Mate D   Signed by:   Madelon Lips Pharm D on 04/15/2010   Method used:   Historical   RxID:   [id number]

## 2010-10-06 NOTE — Progress Notes (Signed)
Summary: Readmit to [facility]    Phone Note Call from Patient   Caller: Nursing Home Summary of Call: Readmission from [facility]. Reviewed medications. No new orders. Follow up with Geriactric Resident.  Initial call taken by: Bobby Rumpf  MD,  October 08, 2009 5:45 PM

## 2010-10-06 NOTE — Progress Notes (Signed)
   Call from nursing director at [facility] requesting increased frequency of Ultram for patient's pain. Increased to 50 mg, 1-2 by mouth q 4 hours as needed pain (max 400 mg/day). Helane Rima DO  October 09, 2009 7:16 PM   Appended Document:  Changed to Tramadol 50 mg, 2 tabs q 6 hours scheduled; added ibuprofen 600 mg three times a day scheduled in between.  Patient is refusing to take the Seroquel and may have flair of behavioral problems.

## 2010-10-06 NOTE — Miscellaneous (Signed)
Summary: Med Update - Rx  Medications Added OXYCONTIN 20 MG XR12H-TAB (OXYCODONE HCL) 1 tab by mouth 3  times a day as needed pain       Clinical Lists Changes  Medications: Changed medication from OXYCONTIN 20 MG XR12H-TAB (OXYCODONE HCL) 1 tab by mouth two times a day as needed pain to OXYCONTIN 20 MG XR12H-TAB (OXYCODONE HCL) 1 tab by mouth 3  times a day as needed pain - Signed Rx of OXYCONTIN 20 MG XR12H-TAB (OXYCODONE HCL) 1 tab by mouth 3  times a day as needed pain;  #1 x 0;  Signed;  Entered by: Christian Mate D;  Authorized by: Madelon Lips Pharm D;  Method used: Historical    Prescriptions: OXYCONTIN 20 MG XR12H-TAB (OXYCODONE HCL) 1 tab by mouth 3  times a day as needed pain  #1 x 0   Entered and Authorized by:   Christian Mate D   Signed by:   Madelon Lips Pharm D on 12/03/2009   Method used:   Historical   RxID:   [id number]

## 2010-10-06 NOTE — Progress Notes (Signed)
Summary: phn msg   Phone Note Other Incoming Call back at [phone]   Caller: [employer] of Call: Checking to see if the paperwork for the scooter had been completed and returned. Initial call taken by: Clydell Hakim,  May 27, 2010 3:33 PM  Follow-up for Phone Call        will forward message to MD to ask if she knows anything about this. Follow-up by: Theresia Lo RN,  May 27, 2010 5:06 PM  Additional Follow-up for Phone Call Additional follow up Details #1::        paperwork done and placed in "to mail" box on 05-29-10

## 2010-10-06 NOTE — Miscellaneous (Signed)
Summary: Med Update - Rx  Medications Added MYLANTA 200-200-20 MG/5ML SUSP (ALUM & MAG HYDROXIDE-SIMETH) 30ml q4 as needed for indigestion       Clinical Lists Changes  Medications: Removed medication of MEGACE ORAL 40 MG/ML SUSP (MEGESTROL ACETATE) 800mg  daily Added new medication of MYLANTA 200-200-20 MG/5ML SUSP (ALUM & MAG HYDROXIDE-SIMETH) 30ml q4 as needed for indigestion - Signed Rx of MYLANTA 200-200-20 MG/5ML SUSP (ALUM & MAG HYDROXIDE-SIMETH) 30ml q4 as needed for indigestion;  #1 x 0;  Signed;  Entered by: Christian Mate D;  Authorized by: Madelon Lips Pharm D;  Method used: Historical    Prescriptions: MYLANTA 200-200-20 MG/5ML SUSP (ALUM & MAG HYDROXIDE-SIMETH) 30ml q4 as needed for indigestion  #1 x 0   Entered and Authorized by:   Christian Mate D   Signed by:   Madelon Lips Pharm D on 11/26/2009   Method used:   Historical   RxID:   [id number]

## 2010-10-06 NOTE — Assessment & Plan Note (Signed)
Summary: oxycontin and oxycodone restarted  Pt had been taken off of narcotic medicines during his [facility] hospitalization.  However, continues to complain of significant pain, especially left knee and leg.  His UDS was negative upon admission to [facility]; however, literature indicates that urine drug screens may not be sensitive enough to detect oxycodone/contin.  Will go ahead and restart previous doses of oxycontin and oxycodone.  However, we will need to measure levels to ensure he is taking them and that there is no diversion of the meds.

## 2010-10-06 NOTE — Assessment & Plan Note (Signed)
Summary: HTN, hip pain,  anemia, thryoid,    Vital Signs:  Patient profile:   55 year old male Weight:      234 pounds Temp:     99.2 degrees F oral Pulse rate:   89 / minute Pulse rhythm:   regular BP sitting:   169 / 79  (left arm) Cuff size:   regular  Vitals Entered By: Loralee Pacas CMA (July 21, 2010 8:59 AM) CC: HTN, leg pain, h/o anemia, Hypothyroidism Is Patient Diabetic? No Comments pt states that he had to go to the ED b/c he was goimg thru withdrawls from running out of his pain meds   Primary Care Provider:  Jamie Brookes MD  CC:  HTN, leg pain, h/o anemia, and Hypothyroidism.  History of Present Illness: Hypertension: Pt comes in today with elevated BP to 169/79. His BP is usually better controlled. Records from the assisted living facility indicate his BP on 11/8 was 126/88. Unclear the reason for his elevated BP today unless it is leg pain. Pt is not having any side effects from the elevated BP today.   Leg Pain: Pt has several scabs on his leg. He also is having some leg pain. in his usual location of the left hip and thigh. He has been picking at this scabs again and complains that his assisted living place is not bandaging them enough.   h/o Anemia: Pt has a h/o anemia of chronic disease. He would like to eliminate some of his meds on his list. We can check his Hg today and see if it is back to normal.   Hypothyroidism: Pt has a h/o hypothyroidism and is currently on Synthroid. His level has not been checked since his dose was adjusted. He is feeling fine but still trying to lose weight.    Habits & Providers  Alcohol-Tobacco-Diet     Tobacco Status: quit > 6 months     Tobacco Counseling: not to resume use of tobacco products  Exercise-Depression-Behavior     Does Patient Exercise: no     Exercise Counseling: to improve exercise regimen     Have you felt down or hopeless? yes     Have you felt little pleasure in things? no     Depression  Counseling: further diagnostic testing and/or other treatment is indicated  Current Medications (verified): 1)  Daily Multiple Vitamins  Tabs (Multiple Vitamin) .... One Daily 2)  Ibuprofen 800 Mg Tabs (Ibuprofen) .... Three Times A Day As Needed 3)  Allopurinol 300 Mg Tabs (Allopurinol) .... Once Daily 4)  Metoprolol Tartrate 25 Mg Tabs (Metoprolol Tartrate) .... Two Times A Day 5)  Amlodipine Besylate 5 Mg Tabs (Amlodipine Besylate) .... Once Daily 6)  Gabapentin 300 Mg Caps (Gabapentin) .... 2 (600mg ) Four Times A Day 7)  Effexor Xr 150 Mg Xr24h-Cap (Venlafaxine Hcl) .Marland Kitchen.. 1 By Mouth Qday 8)  Tramadol Hcl 50 Mg Tabs (Tramadol Hcl) .... 2 Q6 Hours 9)  Percocet 5-325 Mg Tabs (Oxycodone-Acetaminophen) .... One Two Times A Day As Needed For Breakthrough Pain 10)  Zantac 150 Mg Tabs (Ranitidine Hcl) .... Take 1 Pill By Mouth Bid 11)  Oramorph Sr 30 Mg Xr12h-Tab (Morphine Sulfate) .... Take 1 Pill Every 6 Hours For A Total of 120 Mg Daily. 12)  Cyclobenzaprine Hcl 10 Mg Tabs (Cyclobenzaprine Hcl) .... Three Times A Day As Needed Muscle Spasms 13)  Geodon 80 Mg Caps (Ziprasidone Hcl) .... Two Times A Day 14)  Synthroid 125 Mcg Tabs (Levothyroxine  Sodium) .... Once Daily 15)  Tamsulosin Hcl 0.4 Mg Caps (Tamsulosin Hcl) .Marland Kitchen.. 1 Cap Daily At Bedtime  Allergies (verified): No Known Drug Allergies  Social History: Does Patient Exercise:  no  Review of Systems        vitals reviewed and pertinent negatives and positives seen in HPI   Physical Exam  General:  Well-developed,well-nourished,in no acute distress; alert,appropriate and cooperative throughout examination, overweight, vitals reviewed Lungs:  Normal respiratory effort, chest expands symmetrically. Lungs are clear to auscultation, no crackles or wheezes. Heart:  Normal rate and regular rhythm. S1 and S2 normal without gallop, murmur, click, rub or other extra sounds. Msk:  left leg has multiple scars and scabs on it.  Psych:  Alert  and cooperative with normal attention span and concentration. No apparent delusions, illusions, hallucinations, Pt does not always have good judgment intact but is better today than other days.    Impression & Recommendations:  Problem # 1:  HYPERTENSION, BENIGN ESSENTIAL (ICD-401.1) Assessment Deteriorated Pt is taking his meds as prescribed and usually has a much better BP. This appears to be an outlier based on his assisted living notes. Will monitor and not change anything today.   His updated medication list for this problem includes:    Metoprolol Tartrate 25 Mg Tabs (Metoprolol tartrate) .Marland Kitchen..Marland Kitchen Two times a day    Amlodipine Besylate 5 Mg Tabs (Amlodipine besylate) ..... Once daily  Orders: [facility]- Est  Level 4 (04540)  Problem # 2:  OSTEOARTHRITIS, HIP, LEFT (ICD-715.95) Assessment: Unchanged Pt continues to have pain. His pain is being fairly well controlled on the long acting morphine. He has to ask for Percocet often but prefers to have control to ask when he is in pain than to have is scheduled.   The following medications were removed from the medication list:    Acetaminophen 650 Mg Supp (Acetaminophen) .Marland Kitchen..Marland Kitchen 1 rectally ever 4 hours as needed for pain His updated medication list for this problem includes:    Ibuprofen 800 Mg Tabs (Ibuprofen) .Marland Kitchen... Three times a day as needed    Tramadol Hcl 50 Mg Tabs (Tramadol hcl) .Marland Kitchen... 2 q6 hours    Percocet 5-325 Mg Tabs (Oxycodone-acetaminophen) ..... One two times a day as needed for breakthrough pain    Oramorph Sr 30 Mg Xr12h-tab (Morphine sulfate) .Marland Kitchen... Take 1 pill every 6 hours for a total of 120 mg daily.  Orders: [facility]- Est  Level 4 (99214)  Problem # 3:  HYPOTHYROIDISM (ICD-244.9) Assessment: Deteriorated Pt's TSH level is elevated indicating his thyroid is low. Will increase his synthroid slightly and recheck it at his next appointment.   His updated medication list for this problem includes:    Synthroid 137 Mcg Tabs  (Levothyroxine sodium) .Marland Kitchen... Take 1 pill daily for your thyroid  Orders: TSH-[facility] (98119-14782) [facility]- Est  Level 4 (95621)  Problem # 4:  ANEMIA OF CHRONIC DISEASE (ICD-285.29) Assessment: Unchanged  Pt still has anemia. Will cont current meds (iron) for this problem.   Orders: CBC-[facility] (30865) [facility]- Est  Level 4 (78469)  His updated medication list for this problem includes:    Ferrous Sulfate 325 (65 Fe) Mg Tabs (Ferrous sulfate) .Marland Kitchen... Take 1 pill daily  Problem # 5:  VOIDING HESITANCY (ICD-788.69) Assessment: Comment Only Pt has been having some voiding hesitancy. he is concerned that he may have prostate problems. It is technically very difficult to do a DRE on this patient since he is heavy and in a wheel chair. Will  test PSA at this time.   Orders: PSA-[facility] ([address]-60454)  Complete Medication List: 1)  Daily Multiple Vitamins Tabs (Multiple vitamin) .... One daily 2)  Ibuprofen 800 Mg Tabs (Ibuprofen) .... Three times a day as needed 3)  Allopurinol 300 Mg Tabs (Allopurinol) .... Once daily 4)  Metoprolol Tartrate 25 Mg Tabs (Metoprolol tartrate) .... Two times a day 5)  Amlodipine Besylate 5 Mg Tabs (Amlodipine besylate) .... Once daily 6)  Gabapentin 300 Mg Caps (Gabapentin) .... 2 (600mg ) four times a day 7)  Effexor Xr 150 Mg Xr24h-cap (Venlafaxine hcl) .Marland Kitchen.. 1 by mouth qday 8)  Tramadol Hcl 50 Mg Tabs (Tramadol hcl) .... 2 q6 hours 9)  Percocet 5-325 Mg Tabs (Oxycodone-acetaminophen) .... One two times a day as needed for breakthrough pain 10)  Zantac 150 Mg Tabs (Ranitidine hcl) .... Take 1 pill by mouth bid 11)  Oramorph Sr 30 Mg Xr12h-tab (Morphine sulfate) .... Take 1 pill every 6 hours for a total of 120 mg daily. 12)  Cyclobenzaprine Hcl 10 Mg Tabs (Cyclobenzaprine hcl) .... Three times a day as needed muscle spasms 13)  Geodon 80 Mg Caps (Ziprasidone hcl) .... Two times a day 14)  Synthroid 137 Mcg Tabs (Levothyroxine sodium) .... Take 1 pill daily for your  thyroid 15)  Tamsulosin Hcl 0.4 Mg Caps (Tamsulosin hcl) .Marland Kitchen.. 1 cap daily at bedtime 16)  Ferrous Sulfate 325 (65 Fe) Mg Tabs (Ferrous sulfate) .... Take 1 pill daily Prescriptions: FERROUS SULFATE 325 (65 FE) MG TABS (FERROUS SULFATE) take 1 pill daily  #30 x 0   Entered and Authorized by:   Jamie Brookes MD   Signed by:   Jamie Brookes MD on 07/28/2010   Method used:   Historical   RxID:   [id number] SYNTHROID 137 MCG TABS (LEVOTHYROXINE SODIUM) take 1 pill daily for your thyroid  #30 x 6   Entered and Authorized by:   Jamie Brookes MD   Signed by:   Jamie Brookes MD on 07/28/2010   Method used:   Print then Give to Patient   RxID:   [id number]    Orders Added: 1)  CBC-[facility] [85027] 2)  TSH-[facility] [41324-40102] 3)  PSA-[facility] [72536-64403] 4)  [facility]- Est  Level 4 [47425]

## 2010-10-06 NOTE — Miscellaneous (Signed)
Summary: Med Update - Rx  Medications Added IBUPROFEN 800 MG TABS (IBUPROFEN) three times a day as needed OXYCONTIN 30 MG XR12H-TAB (OXYCODONE HCL) three times a day DOXYCYCLINE HYCLATE 100 MG CAPS (DOXYCYCLINE HYCLATE) two times a day CYCLOBENZAPRINE HCL 10 MG TABS (CYCLOBENZAPRINE HCL) three times a day as needed muscle spasms       Clinical Lists Changes  Medications: Changed medication from IBUPROFEN 800 MG TABS (IBUPROFEN) three times a day to IBUPROFEN 800 MG TABS (IBUPROFEN) three times a day as needed - Signed Removed medication of OXYCONTIN 20 MG XR12H-TAB (OXYCODONE HCL) 1 tab by mouth 2  times a day as needed pain, and 30 mg qhs Added new medication of OXYCONTIN 30 MG XR12H-TAB (OXYCODONE HCL) three times a day - Signed Added new medication of DOXYCYCLINE HYCLATE 100 MG CAPS (DOXYCYCLINE HYCLATE) two times a day - Signed Added new medication of CYCLOBENZAPRINE HCL 10 MG TABS (CYCLOBENZAPRINE HCL) three times a day as needed muscle spasms - Signed Rx of IBUPROFEN 800 MG TABS (IBUPROFEN) three times a day as needed;  #1 x 0;  Signed;  Entered by: Christian Mate D;  Authorized by: Madelon Lips Pharm D;  Method used: Historical Rx of OXYCONTIN 30 MG XR12H-TAB (OXYCODONE HCL) three times a day;  #1 x 0;  Signed;  Entered by: Christian Mate D;  Authorized by: Madelon Lips Pharm D;  Method used: Historical Rx of DOXYCYCLINE HYCLATE 100 MG CAPS (DOXYCYCLINE HYCLATE) two times a day;  #1 x 0;  Signed;  Entered by: Christian Mate D;  Authorized by: Madelon Lips Pharm D;  Method used: Historical Rx of CYCLOBENZAPRINE HCL 10 MG TABS (CYCLOBENZAPRINE HCL) three times a day as needed muscle spasms;  #1 x 0;  Signed;  Entered by: Christian Mate D;  Authorized by: Madelon Lips Pharm D;  Method used: Historical    Prescriptions: CYCLOBENZAPRINE HCL 10 MG TABS (CYCLOBENZAPRINE HCL) three times a day as needed muscle spasms  #1 x 0   Entered and Authorized by:   Christian Mate D  Signed by:   Madelon Lips Pharm D on 02/11/2010   Method used:   Historical   RxID:   [id number] DOXYCYCLINE HYCLATE 100 MG CAPS (DOXYCYCLINE HYCLATE) two times a day  #1 x 0   Entered and Authorized by:   Christian Mate D   Signed by:   Madelon Lips Pharm D on 02/11/2010   Method used:   Historical   RxID:   [id number] OXYCONTIN 30 MG XR12H-TAB (OXYCODONE HCL) three times a day  #1 x 0   Entered and Authorized by:   Christian Mate D   Signed by:   Madelon Lips Pharm D on 02/11/2010   Method used:   Historical   RxID:   [id number] IBUPROFEN 800 MG TABS (IBUPROFEN) three times a day as needed  #1 x 0   Entered and Authorized by:   Christian Mate D   Signed by:   Madelon Lips Pharm D on 02/11/2010   Method used:   Historical   RxID:   [id number]

## 2010-10-06 NOTE — Miscellaneous (Signed)
Summary: Sent to ER   Clinical Lists Changes  Evaluated for AMS- See note in Paper chart  Nursing noted he was not at nursing station at shift change this AM at 7 am as usual.  Throughout the morning was noted to be somnolent, but arousable. VS HR108, BP 108/62, normal PO2.  Jeffrey [name] was see in bed, somnolent but arousable  Oriented.  Strong urine odor seen in room. Physical exam non-focal, WNL, but appears dehydrated.  Patient with a history of ETOH abuse, hoarding the chronic narcotics he is given for pain control.  Also recent elevated TSH. Will send to ER for evaluation of AMS to facilitate efficient workup.  Jeffrey [name] Harness MD  May 18, 2010 3:28 PM

## 2010-10-06 NOTE — Assessment & Plan Note (Signed)
Summary: Nursing Home Visit   Vital Signs:  Patient profile:   55 year old male Temp:     98.6 degrees F Pulse rate:   71 / minute Resp:     21 per minute BP sitting:   145 / 84  Primary Care Provider:  Jamie Brookes MD  CC:  Nursing Home visit.  History of Present Illness: Weight Gain/obesity: Pt has had 40 lb weight gain in the last 4 months. He was on Remeron but it was stopped. He is currently on Geodon for his behavior issues.   Smoking Cessation: Pt is trying to quit smoking. He is currently using the patch. However, he is chewing the tobacco that is rolled inside the cigarettes in his room upon my visit.  Tooth Extraction: Pt had all his lower teeth extracted on 7/18. It was 13 teeth in total. he has been having some extra pain associated with this. He had a full liquid diet for 5 days, then mechanical diet until his new dental appointment on 7/28. He is currently on OxyIR 10 mg q8 for pain x 10 days. He is also on Keflex 500mg  QID.   Hypothyroid: Pt had a recently elevated TSH so his Levothyroxine was increased to 150 mg . Plan to recheck in 6 weeks.   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) 9)  Urinary Incontinence, Male  (ICD-788.30) 10)  Bipolar Disorder Unspecified  (ICD-296.80) 11)  Osteoarthritis, Hip, 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) 12)  Hypothyroidism  (ICD-244.9) 13)  Hypertension, Benign Essential  (ICD-401.1) 14)  Back Pain, Chronic   (ICD-724.5) 15)  Degenerative Joint Disease, Cervical Spine  (ICD-721.90) 16)  Spinal Stenosis of Lumbar Region  (ICD-724.02) 17)  Anemia of Chronic Disease  (ICD-285.29) 18)  Hepatitis C, Hx of  (ICD-V12.09) 19)  Alcohol Abuse  (ICD-305.00) 20)  Depression/anxiety  (ICD-300.4) 21)  Insomnia  (ICD-780.52) 22)  Cirrhosis, Alcoholic  (ICD-571.2) 23)  Hypoalbuminemia  (ICD-273.8) 24)  Other Ascites  (ICD-789.59) 25)  Hx of Substance Abuse, Multiple  (ICD-305.90) 26)  Person Living in Residential Institution  (ICD-V60.6)  Medications Prior to Update: 1)  Daily Multiple Vitamins  Tabs (Multiple Vitamin) .... One Daily 2)  Ibuprofen 800 Mg Tabs (Ibuprofen) .... Three Times A Day As Needed 3)  Allopurinol 300 Mg Tabs (Allopurinol) .... Once Daily 4)  Metoprolol Tartrate 25 Mg Tabs (Metoprolol Tartrate) .... Two Times A Day 5)  Amlodipine Besylate 5 Mg Tabs (Amlodipine Besylate) .... Once Daily 6)  Gabapentin 300 Mg Caps (Gabapentin) .... 2 (600mg ) Four Times A Day 7)  Effexor Xr 75 Mg Xr24h-Cap (Venlafaxine Hcl) .... Two Daily 8)  Tramadol Hcl 50 Mg Tabs (Tramadol Hcl) .... 2 Q6 Hours 9)  Geodon 60 Mg Caps (Ziprasidone Hcl) .... Two Times A Day 10)  Mylanta 200-200-20 Mg/87ml Susp (Alum & Mag Hydroxide-Simeth) .... 30ml Q4 As Needed For Indigestion 11)  Levothroid 150 Mcg Tabs (Levothyroxine Sodium) .... One Daily 12)  Percocet 5-325 Mg Tabs (Oxycodone-Acetaminophen) .Marland KitchenMarland KitchenMarland Kitchen  One Two Times A Day As Needed For Breakthrough Pain 13)  Zantac 150 Mg Tabs (Ranitidine Hcl) .... Take 1 Pill By Mouth Bid 14)  Oxycontin 30 Mg Xr12h-Tab (Oxycodone Hcl) .... Three Times A Day 15)  Cyclobenzaprine Hcl 10 Mg Tabs (Cyclobenzaprine Hcl) .... Three Times A Day As Needed Muscle Spasms  Current Medications (verified): 1)  Daily Multiple Vitamins  Tabs (Multiple Vitamin) .... One Daily 2)  Ibuprofen 800 Mg Tabs (Ibuprofen) .... Three Times A Day As Needed 3)  Allopurinol 300 Mg Tabs (Allopurinol) .... Once  Daily 4)  Metoprolol Tartrate 25 Mg Tabs (Metoprolol Tartrate) .... Two Times A Day 5)  Amlodipine Besylate 5 Mg Tabs (Amlodipine Besylate) .... Once Daily 6)  Gabapentin 300 Mg Caps (Gabapentin) .... 2 (600mg ) Four Times A Day 7)  Effexor Xr 75 Mg Xr24h-Cap (Venlafaxine Hcl) .... Two Daily 8)  Tramadol Hcl 50 Mg Tabs (Tramadol Hcl) .... 2 Q6 Hours 9)  Geodon 60 Mg Caps (Ziprasidone Hcl) .... Two Times A Day 10)  Mylanta 200-200-20 Mg/56ml Susp (Alum & Mag Hydroxide-Simeth) .... 30ml Q4 As Needed For Indigestion 11)  Levothroid 150 Mcg Tabs (Levothyroxine Sodium) .... One Daily 12)  Percocet 5-325 Mg Tabs (Oxycodone-Acetaminophen) .... One Two Times A Day As Needed For Breakthrough Pain 13)  Zantac 150 Mg Tabs (Ranitidine Hcl) .... Take 1 Pill By Mouth Bid 14)  Oxycontin 30 Mg Xr12h-Tab (Oxycodone Hcl) .... Three Times A Day 15)  Cyclobenzaprine Hcl 10 Mg Tabs (Cyclobenzaprine Hcl) .... Three Times A Day As Needed Muscle Spasms 16)  Nicoderm Cq 21 Mg/24hr Pt24 (Nicotine)  Allergies (verified): No Known Drug Allergies  Past History:  Past Medical History: Last updated: 03/18/2010 Alcohol abuse Chronic anemia Pancytopenia ? 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 Lajoyce Corners and Dr. Johna Sheriff April 15, 2009 Wound closure of LLE and L buttocks by Dr. Lajoyce Corners 05/02/09 Evacuation of hematoma and reclosure of hip wound Dr. Lajoyce Corners 05/05/09  Family History: Last updated: 01/13/2010 unknown no known family or friends  Social History: Last updated: 03/18/2010 Homeless, now a resident at [facility] Alcoholic-drink free since institutionalized Tobacco abuse-smoking to chewing  Physical Exam  General:  Well-developed,well-nourished,in no acute distress; alert,appropriate and cooperative throughout examination Mouth:  multiple sutures in places where 13 lower  teeth were pulled.  Lungs:  Normal respiratory effort, chest expands symmetrically. Lungs are clear to auscultation, no crackles or wheezes. Heart:  Normal rate and regular rhythm. S1 and S2 normal without gallop, murmur, click, rub or other extra sounds. Psych:  pt iw watching TV but does respond and interact normally with me when asked questions.    Impression & Recommendations:  Problem # 1:  EXOGENOUS OBESITY (ICD-278.00) Assessment Deteriorated Pt continues to gain weight. He is eating well and not able to exercise. It would be best to develop an exercise program that he could do at the Nursing home for upper body strength at least. Will discuss with attendings how to accomplish this.   Orders: [facility]- Est Level  3 (40981)  Problem # 2:  NICOTINE ADDICTION (ICD-305.1) Assessment: Improved pt is not using a patch instead of smoking but he is chewing on the tobacco from inside the cigarettes. He says he wants to quit all of it. Concern to nicotine poisening if he chews too much.   His updated medication list for this problem includes:  Nicoderm Cq 21 Mg/24hr Pt24 (Nicotine)  Orders: [facility]- Est Level  3 (16109)  Problem # 3:  DENTAL CARIES (ICD-521.00) Assessment: Improved Pt has had all his lower teeth extracted. He is to have dentures made. His mouth appears to be healing well.   Orders: [facility]- Est Level  3 (60454)  Complete Medication List: 1)  Daily Multiple Vitamins Tabs (Multiple vitamin) .... One daily 2)  Ibuprofen 800 Mg Tabs (Ibuprofen) .... Three times a day as needed 3)  Allopurinol 300 Mg Tabs (Allopurinol) .... Once daily 4)  Metoprolol Tartrate 25 Mg Tabs (Metoprolol tartrate) .... Two times a day 5)  Amlodipine Besylate 5 Mg Tabs (Amlodipine besylate) .... Once daily 6)  Gabapentin 300 Mg Caps (Gabapentin) .... 2 (600mg ) four times a day 7)  Effexor Xr 75 Mg Xr24h-cap (Venlafaxine hcl) .... Two daily 8)  Tramadol Hcl 50 Mg Tabs (Tramadol hcl) .... 2 q6 hours 9)   Geodon 60 Mg Caps (Ziprasidone hcl) .... Two times a day 10)  Mylanta 200-200-20 Mg/75ml Susp (Alum & mag hydroxide-simeth) .... 30ml q4 as needed for indigestion 11)  Levothroid 150 Mcg Tabs (Levothyroxine sodium) .... One daily 12)  Percocet 5-325 Mg Tabs (Oxycodone-acetaminophen) .... One two times a day as needed for breakthrough pain 13)  Zantac 150 Mg Tabs (Ranitidine hcl) .... Take 1 pill by mouth bid 14)  Oxycontin 30 Mg Xr12h-tab (Oxycodone hcl) .... Three times a day 15)  Cyclobenzaprine Hcl 10 Mg Tabs (Cyclobenzaprine hcl) .... Three times a day as needed muscle spasms 16)  Nicoderm Cq 21 Mg/24hr Pt24 (Nicotine)

## 2010-10-06 NOTE — Progress Notes (Signed)
   Phone Note Call from Patient   Caller: Nursing Home Summary of Call: called by nursing staff. patient continues to be aggressive and threatening toward staff. has picked up his wheelchair. has had male resident touching his genitalia. his cursing at staff. agreed with plan to send to ED for ACT team eval.  Initial call taken by: Lequita Asal  MD,  September 30, 2009 9:08 AM

## 2010-10-06 NOTE — Miscellaneous (Signed)
Summary: Med Update - Rx  Medications Added IBUPROFEN 800 MG TABS (IBUPROFEN) three times a day METOPROLOL TARTRATE 25 MG TABS (METOPROLOL TARTRATE) two times a day AMLODIPINE BESYLATE 5 MG TABS (AMLODIPINE BESYLATE) once daily GABAPENTIN 300 MG CAPS (GABAPENTIN) 2 (600mg ) three times a day MIRTAZAPINE 15 MG TABS (MIRTAZAPINE) at bedtime EFFEXOR XR 75 MG XR24H-CAP (VENLAFAXINE HCL) once daily TRAMADOL HCL 50 MG TABS (TRAMADOL HCL) 2 q6 hours GEODON 60 MG CAPS (ZIPRASIDONE HCL) once daily       Clinical Lists Changes  Medications: Removed medication of TOPROL XL 50 MG XR24H-TAB (METOPROLOL SUCCINATE) 1 tab by mouth daily for blood pressure Added new medication of METOPROLOL TARTRATE 25 MG TABS (METOPROLOL TARTRATE) two times a day - Signed Removed medication of FERROUS SULFATE 325 (65 FE) MG TABS (FERROUS SULFATE) once daily Removed medication of DOXYCYCLINE HYCLATE 100 MG SOLR (DOXYCYCLINE HYCLATE) 1 tab by mouth two times a day Changed medication from IBUPROFEN 800 MG TABS (IBUPROFEN) TID to IBUPROFEN 800 MG TABS (IBUPROFEN) three times a day - Signed Added new medication of AMLODIPINE BESYLATE 5 MG TABS (AMLODIPINE BESYLATE) once daily - Signed Added new medication of GABAPENTIN 300 MG CAPS (GABAPENTIN) 2 (600mg ) three times a day - Signed Added new medication of MIRTAZAPINE 15 MG TABS (MIRTAZAPINE) at bedtime - Signed Added new medication of EFFEXOR XR 75 MG XR24H-CAP (VENLAFAXINE HCL) once daily - Signed Added new medication of TRAMADOL HCL 50 MG TABS (TRAMADOL HCL) 2 q6 hours - Signed Added new medication of GEODON 60 MG CAPS (ZIPRASIDONE HCL) once daily - Signed Rx of METOPROLOL TARTRATE 25 MG TABS (METOPROLOL TARTRATE) two times a day;  #1 x 0;  Signed;  Entered by: Christian Mate D;  Authorized by: Madelon Lips Pharm D;  Method used: Historical Rx of IBUPROFEN 800 MG TABS (IBUPROFEN) three times a day;  #1 x 0;  Signed;  Entered by: Christian Mate D;  Authorized by: Madelon Lips Pharm D;  Method used: Historical Rx of AMLODIPINE BESYLATE 5 MG TABS (AMLODIPINE BESYLATE) once daily;  #1 x 0;  Signed;  Entered by: Christian Mate D;  Authorized by: Madelon Lips Pharm D;  Method used: Historical Rx of GABAPENTIN 300 MG CAPS (GABAPENTIN) 2 (600mg ) three times a day;  #1 x 0;  Signed;  Entered by: Christian Mate D;  Authorized by: Madelon Lips Pharm D;  Method used: Historical Rx of MIRTAZAPINE 15 MG TABS (MIRTAZAPINE) at bedtime;  #1 x 0;  Signed;  Entered by: Christian Mate D;  Authorized by: Madelon Lips Pharm D;  Method used: Historical Rx of EFFEXOR XR 75 MG XR24H-CAP (VENLAFAXINE HCL) once daily;  #1 x 0;  Signed;  Entered by: Christian Mate D;  Authorized by: Madelon Lips Pharm D;  Method used: Historical Rx of TRAMADOL HCL 50 MG TABS (TRAMADOL HCL) 2 q6 hours;  #1 x 0;  Signed;  Entered by: Christian Mate D;  Authorized by: Madelon Lips Pharm D;  Method used: Historical Rx of GEODON 60 MG CAPS (ZIPRASIDONE HCL) once daily;  #1 x 0;  Signed;  Entered by: Christian Mate D;  Authorized by: Madelon Lips Pharm D;  Method used: Historical    Prescriptions: GEODON 60 MG CAPS (ZIPRASIDONE HCL) once daily  #1 x 0   Entered and Authorized by:   Christian Mate D   Signed by:   Madelon Lips Pharm D on 11/05/2009   Method used:  Historical   RxID:   [id number] TRAMADOL HCL 50 MG TABS (TRAMADOL HCL) 2 q6 hours  #1 x 0   Entered and Authorized by:   Christian Mate D   Signed by:   Madelon Lips Pharm D on 11/05/2009   Method used:   Historical   RxID:   [id number] EFFEXOR XR 75 MG XR24H-CAP (VENLAFAXINE HCL) once daily  #1 x 0   Entered and Authorized by:   Christian Mate D   Signed by:   Madelon Lips Pharm D on 11/05/2009   Method used:   Historical   RxID:   [id number] MIRTAZAPINE 15 MG TABS (MIRTAZAPINE) at bedtime  #1 x 0   Entered and Authorized by:   Christian Mate D   Signed by:   Madelon Lips Pharm D on 11/05/2009   Method  used:   Historical   RxID:   [id number] GABAPENTIN 300 MG CAPS (GABAPENTIN) 2 (600mg ) three times a day  #1 x 0   Entered and Authorized by:   Christian Mate D   Signed by:   Madelon Lips Pharm D on 11/05/2009   Method used:   Historical   RxID:   [id number] AMLODIPINE BESYLATE 5 MG TABS (AMLODIPINE BESYLATE) once daily  #1 x 0   Entered and Authorized by:   Christian Mate D   Signed by:   Madelon Lips Pharm D on 11/05/2009   Method used:   Historical   RxID:   [id number] IBUPROFEN 800 MG TABS (IBUPROFEN) three times a day  #1 x 0   Entered and Authorized by:   Christian Mate D   Signed by:   Madelon Lips Pharm D on 11/05/2009   Method used:   Historical   RxID:   [id number] METOPROLOL TARTRATE 25 MG TABS (METOPROLOL TARTRATE) two times a day  #1 x 0   Entered and Authorized by:   Christian Mate D   Signed by:   Madelon Lips Pharm D on 11/05/2009   Method used:   Historical   RxID:   [id number]

## 2010-10-06 NOTE — Initial Assessments (Signed)
Summary: Hospital Admission   Primary Care Provider:  Jamie Brookes MD   History of Present Illness:    Pt sent via EMS from [facility] for decreased level of conciousness. SNF RN noted pt to be more somnolent than normal and not his normal active self. 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 Pt is known to hoard pain medications and known alcoholic; however this was not found to be cause of increased somnolence therefore pt sent to ED.Rn did note pt had a very foul odor from room.  Upon evaluation in ED pt found to be febrile, tachycardic with postitive UA and verified pus with insertion of I and O cath, he also has persistant AMS with little interaction with medical staff.     Unable to obtain ROS  Current Medications (verified): 1)  Daily Multiple Vitamins  Tabs (Multiple Vitamin) .... One Daily 2)  Ibuprofen 800 Mg Tabs (Ibuprofen) .... Three Times A Day As Needed 3)  Allopurinol 300 Mg Tabs (Allopurinol) .... Once Daily 4)  Metoprolol Tartrate 25 Mg Tabs (Metoprolol Tartrate) .... Two Times A Day 5)  Amlodipine Besylate 5 Mg Tabs (Amlodipine Besylate) .... Once Daily 6)  Gabapentin 300 Mg Caps (Gabapentin) .... 2 (600mg ) Four Times A Day 7)  Effexor Xr 75 Mg Xr24h-Cap (Venlafaxine Hcl) .... Two Daily 8)  Tramadol Hcl 50 Mg Tabs (Tramadol Hcl) .... 2 Q6 Hours 9)  Mylanta 200-200-20 Mg/79ml Susp (Alum & Mag Hydroxide-Simeth) .... 30ml Q4 As Needed For Indigestion 10)  Percocet 5-325 Mg Tabs (Oxycodone-Acetaminophen) .... One Two Times A Day As Needed For Breakthrough Pain 11)  Zantac 150 Mg Tabs (Ranitidine Hcl) .... Take 1 Pill By Mouth Bid 12)  Oxycontin 30 Mg Xr12h-Tab (Oxycodone Hcl) .... Three Times A Day 13)  Cyclobenzaprine Hcl 10 Mg Tabs (Cyclobenzaprine Hcl) .... Three Times A Day As Needed Muscle Spasms 14)  Nicoderm Cq 21 Mg/24hr Pt24 (Nicotine) 15)  Geodon 80 Mg Caps (Ziprasidone Hcl) .... Two Times A Day 16)   Levothroid 125 Mcg Tabs (Levothyroxine Sodium) .... Once Daily 17)  Peridex 0.12 % Soln (Chlorhexidine Gluconate) .... Swish and Spit - 15 Ml For 30 Seconds  Two Times A Day  Allergies (verified): No Known Drug Allergies  Past History:  Past Medical History: Last updated: 03/18/2010 Alcohol abuse Chronic anemia Pancytopenia ? 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 Lajoyce Corners and Dr. Johna Sheriff April 15, 2009 Wound closure of LLE and L buttocks by Dr. Lajoyce Corners 05/02/09 Evacuation of hematoma and reclosure of hip wound Dr. Lajoyce Corners 05/05/09  Family History: Last updated: 01/13/2010 unknown no known family or friends  Social History: Last updated: 03/18/2010 Homeless, now a resident at [facility] Alcoholic-drink free since institutionalized Tobacco abuse-smoking to chewing  Physical Exam  General:  T 103. 4 F rectally, RR 20 HR 101 BP 136/65 96%RA Gen- very somnolent, does not respond to most questions, oriented to self only. HEENT- P[place], pupils 4mm bilat, EOMI, non icteric, dry mucous membranes, no thyromegaly, no cervical nodes palpated CVS- tachycardia, no murmur, normal S1, S2 RESP- anterior chest CTAB rr 14 AT Bedside Abd- Soft, NT/ND, hypoactive BS GU- yellow pus at urethra ntoed Ext- well healed left hip surgical scar, no edema pulses 2+ equal bilat  Neuro- somnolent, follows some commands when aroused  Additional Exam:  UDS positive Opiates  UA- cloudy, SG 1.025, +Nitrates and large  Leukocyte esterase,  Mico-many bacteria WBC TNTC, graunlar casts , large blood , > 300 protein CBC 15/13.1/38.6/116  Diff 89% neutrophils , 20% bands                                      CMET Perf. Loc.  Sodium (NA)                              128        l      135-145          mEq/L  Potassium (K)                            4.6               3.5-5.1           mEq/L  Chloride                                 94         l      96-112           mEq/L  CO2                                      21                19-32            mEq/L  Glucose                                  102        h      70-99            mg/dL  BUN                                      46         h      6-23             mg/dL  Creatinine                               3.32       h      0.4-1.5          mg/dL  GFR, Est Non African American            20         l      >60              mL/min  GFR, Est African American                24         l      >60              mL/min  Oversized comment, see footnote  1  Bilirubin, Total                         0.5               0.3-1.2          mg/dL  Alkaline Phosphatase                     134        h      39-117           U/L  SGOT (AST)                               37                0-37             U/L  SGPT (ALT)                               18                0-53             U/L  Total  Protein                           7.3               6.0-8.3          g/dL  Albumin-Blood                            3.0        l      3.5-5.2          g/dL  Calcium                                  8.2        l      8.4-10.5         mg/dL   Ammonia 20 , ETOH <5 Lactate 2.3    Procalcitonin- Pending CXR- No infiltrate, No vascular congestion, noted Left base opacity ? Malignancy- reccomend CT chest for f/u CT Head -no acute findings EKG- NSR, RAD, no ST changes  Blood Cx and Urine Cx- Pending    Impression & Recommendations:  Problem # 1:  Urosepsis  Will admit for Urosepsis, to ICU, will change coverage to Zosyn and narrow pending his sensitivies and culture results, to cover enteroccus from SNF. IVF for rehyration. Anti-pyretics. If no improvment over night, with antibiotic consider CT abd/pelvis   Problem # 2:  Acute renal failure Likley secondary to Urosepsis. Obtain renal U/S, calculate Fena, rehydrate and monitor,Will place  foley  Cr  0.77 in Feb 2011  Problem # 3:  AMS Secondary to Urosepsis, will hold opiates at this time, as needed if needed, watch for withdrawel. No benzo even though pt with history of ETOH secondary to current AMS, concern for airway protection CT head neg  Problem # 4:  HYPERTENSION, BENIGN ESSENTIAL (ICD-401.1)  Will give IV meds as needed, hold by mouth until mental status clears, monitor  for hypotension, septic shock His updated medication list for this problem includes:    Metoprolol Tartrate 25 Mg Tabs (Metoprolol tartrate) .Marland Kitchen..Marland Kitchen Two times a day    Amlodipine Besylate 5 Mg Tabs (Amlodipine besylate) ..... Once daily  Problem # 5:  HYPOTHYROIDISM (ICD-244.9) check TSH, give home dose via IV  His updated medication list for this problem includes:    Levothroid 125 Mcg Tabs (Levothyroxine sodium) ..... Once daily  Problem # 6:  Hyponatremia Hyponatremia in setting of sepsis, replete with NS and monitor   Problem # 7:  Chest Mass  Will need CT of chest once stable, to evaluate opacity Fen/GI- NPO until bedside swallow, IVF   Prophylaxis- Heparin Subq   Dispo- Full Code per SNF records, pending clinical improvement, plan back to SNF once stable   Complete Medication List: 1)  Daily Multiple Vitamins Tabs (Multiple vitamin) .... One daily 2)  Ibuprofen 800 Mg Tabs (Ibuprofen) .... Three times a day as needed 3)  Allopurinol 300 Mg Tabs (Allopurinol) .... Once daily 4)  Metoprolol Tartrate 25 Mg Tabs (Metoprolol tartrate) .... Two times a day 5)  Amlodipine Besylate 5 Mg Tabs (Amlodipine besylate) .... Once daily 6)  Gabapentin 300 Mg Caps (Gabapentin) .... 2 (600mg ) four times a day 7)  Effexor Xr 75 Mg Xr24h-cap (Venlafaxine hcl) .... Two daily 8)  Tramadol Hcl 50 Mg Tabs (Tramadol hcl) .... 2 q6 hours 9)  Mylanta 200-200-20 Mg/32ml Susp (Alum & mag hydroxide-simeth) .... 30ml q4 as needed for indigestion 10)  Percocet 5-325 Mg Tabs (Oxycodone-acetaminophen) .... One two times a day as  needed for breakthrough pain 11)  Zantac 150 Mg Tabs (Ranitidine hcl) .... Take 1 pill by mouth bid 12)  Oxycontin 30 Mg Xr12h-tab (Oxycodone hcl) .... Three times a day 13)  Cyclobenzaprine Hcl 10 Mg Tabs (Cyclobenzaprine hcl) .... Three times a day as needed muscle spasms 14)  Nicoderm Cq 21 Mg/24hr Pt24 (Nicotine) 15)  Geodon 80 Mg Caps (Ziprasidone hcl) .... Two times a day 16)  Levothroid 125 Mcg Tabs (Levothyroxine sodium) .... Once daily 17)  Peridex 0.12 % Soln (Chlorhexidine gluconate) .... Swish and spit - 15 ml for 30 seconds  two times a day

## 2010-10-06 NOTE — Consult Note (Signed)
Summary: [facility]- Office Visit  [facility]- Office Visit   Imported By: De Nurse 03/17/2010 16:00:19  _____________________________________________________________________  External Attachment:    Type:   Image     Comment:   External Document

## 2010-10-06 NOTE — Miscellaneous (Signed)
Primary Care Provider:  Jamie Brookes MD   History of Present Illness: Discharged after very short Behavioral Health admission for hallucinations, bizarre behavior, contamination of public environment with blood (Hep C +) with his fingers after digging and picking at wounds.    Wounds healed and discharged from Dr. Queen Blossom care.  Extensive follow up by Dr. Lajoyce Corners in this difficult patient.  Constantly antagoistic to staff, always asking for pain meds.  Uses foul language to the nurses calling them names.  Patient is on narcotics for chronic pain realted to severe cervical and lumbar spinal stenosis.      Allergies: No Known Drug Allergies  Family History: Reviewed history from 05/08/2009 and no changes required. unknown  Social History: Reviewed history from 09/19/2009 and no changes required. Homeless Alcoholic  Physical Exam  General:  BIzarre constant pressured speech, propelling self in wheelchair, blood on fingers from self inflected picking and opening of skin. Neurologic:  Gait:  non-erect, hunched over, moves in small steps, very unstable. Skin:  Wounds checked-completely healed, few scabs that were bleeding from him picking. Psych:  judgment poor, agitated, angry, hostile, hallucinating, and delusional.   Talked about feeding a "bear" in the courtyard, thinks staff are witholding his pain meds.   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  Long standing know alcholic.  DId test for NH3 at was in normal range, has normal LFTs.  Brief psych admission, turns out that he is well know to them.  Increased antipsychotic, very difficult to manage with current paranoia, delusions, and hallucinations.  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 His updated medication list for this problem includes:    Oxycontin 20 Mg Xr12h-tab (Oxycodone hcl) .Marland Kitchen... Tid    Oxycodone Hcl 5 Mg Tabs (Oxycodone hcl) .Marland Kitchen... 1 tab by mouth q6 hours as needed for pain    Ibuprofen 800 Mg Tabs (Ibuprofen) ..... One two times a day    Acetaminophen 650 Mg Tbcr (Acetaminophen) .Marland Kitchen... Three times a day as needed  Problem # 3:  CELLULITIS AND ABSCESS OF BUTTOCK (ICD-682.5)  Resolved, discontinued doxy today, monitor for skin infections closly.  Still has scabs on both buttocks site and leg site. 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  His hands are frequently stained with blood as he constantly picks at his skin.  Have tried using hydroxyzine to control this, it has not been effective.  Orders: Provider Misc Charge- [facility] (Misc)  Complete Medication List: 1)  Norvasc 10 Mg Tabs (Amlodipine besylate) .... One daily 2)  Toprol Xl 200 Mg Xr24h-tab (Metoprolol succinate) .... One daily 3)  Daily Multiple Vitamins Tabs (Multiple vitamin) .... One daily 4)  Oxycontin 20 Mg Xr12h-tab (Oxycodone hcl) .... Tid 5)  Oxycodone Hcl 5 Mg Tabs (Oxycodone hcl) .Marland Kitchen.. 1 tab by mouth q6 hours as needed for pain 6)  Ambien 10 Mg Tabs (Zolpidem tartrate) .Marland KitchenMarland KitchenMarland Kitchen  At bedtime prn 7)  Lithium Carbonate 300 Mg Caps (Lithium carbonate) .... By mouth three  times a day per psych 8)  Ferrous Sulfate 325 (65 Fe) Mg Tabs (Ferrous sulfate) .... Once daily 9)  Zinc Sulfate 220 Mg Tabs (Zinc sulfate) .... One daily 10)  Enlive Liqd (Nutritional supplements)  .... One scoop three times a day 11)  Resource Beneprotein Pack (Protein) .Marland Kitchen.. 1 scoop 3 times daily 12)  Ibuprofen 800 Mg Tabs (Ibuprofen) .... One two times a day 13)  Levothyroxine Sodium 100 Mcg Tabs (Levothyroxine sodium) .... One daily 14)  Seroquel 100 Mg Tabs (Quetiapine fumarate) .... Once daily at 4:00 pm 15)  Seroquel 50 Mg Tabs (Quetiapine fumarate) .... Once daily in am 16)  Allopurinol 300 Mg Tabs (Allopurinol) .... Once daily 17)  Effexor Xr 75 Mg Xr24h-cap (Venlafaxine hcl) .... Once daily 18)  Thiamine Hcl 100 Mg Tabs (Thiamine hcl) .... Once daily 19)  Acetaminophen 650 Mg Tbcr (Acetaminophen) .... Three times a day as needed 20)  Megace Oral 40 Mg/ml Susp (Megestrol acetate) .... 800mg  daily 21)  Bisacodyl Ec 5 Mg Tbec (Bisacodyl) .... Once daily as needed

## 2010-10-06 NOTE — Assessment & Plan Note (Signed)
Summary: Nursing Home Visit      Vital Signs:  Patient profile:   55 year old male Weight:      218 pounds Temp:     96.7 degrees F Pulse rate:   71 / minute Resp:     20 per minute BP sitting:   115 / 71  Primary Care Provider:  Jamie Brookes MD  CC:  Left hip/knee pain, hypothyroidism, tooth decay, depression, and ambulation.  History of Present Illness: Lt Hip and Knee pain: Pt continues to have left hip and knee pain. He recently went to see Dr. Lajoyce Corners who did a prior surgery of his. 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 dr. Lajoyce Corners does not feel pt is a good surgical candidate due to his risk of significant infection if TNA inattempted. The recommendation was to increase his pain medication.   Hypothyroidism: Pt has a h/o hypothyroidism. he had his TSH collected on 01/02/10 and it was 13.3 and repeat was 0.984 on 01-08-10. His Levothyroxine was increased to 100 micrograms qDay. It will be rechecked in 6 weeks.   Teeth decayed: Pt has recently seen a dentist. He has so many teeth that are decayed it was recommended that he have all his teeth pulled and have dentures put in. It was also recommended that he stop chewing tobacco. Pt is excited about his dentures.   Depression: PHQ9 done shows score of 13/27= moderate depression. He seems to have no friends or family. He really wants to live on his own in the community but says that nothing that he planned for himself seems to be happening. He is happy that his leg is healing and he is getting better, but he wants to be able to ambulate and not be in pain, and live outside the nursing home.   Ambulation: Currently the patient is abulating by sitting in a wheel chair and pushing himself along with one leg.   Habits & Providers  Alcohol-Tobacco-Diet     Tobacco Status: current     Other Tobacco snuff  Current Medications (verified): 1)  Daily Multiple Vitamins  Tabs (Multiple  Vitamin) .... One Daily 2)  Ibuprofen 800 Mg Tabs (Ibuprofen) .... Three Times A Day 3)  Allopurinol 300 Mg Tabs (Allopurinol) .... Once Daily 4)  Thiamine Hcl 100 Mg Tabs (Thiamine Hcl) .... Once Daily 5)  Oxycontin 20 Mg Xr12h-Tab (Oxycodone Hcl) .Marland Kitchen.. 1 Tab By Mouth 2  Times A Day As Needed Pain, and 30 Mg Qhs 6)  Metoprolol Tartrate 25 Mg Tabs (Metoprolol Tartrate) .... Two Times A Day 7)  Amlodipine Besylate 5 Mg Tabs (Amlodipine Besylate) .... Once Daily 8)  Gabapentin 300 Mg Caps (Gabapentin) .... 2 (600mg ) Four Times A Day 9)  Mirtazapine 15 Mg Tabs (Mirtazapine) .... At Bedtime 10)  Effexor Xr 75 Mg Xr24h-Cap (Venlafaxine Hcl) .... Two Daily 11)  Tramadol Hcl 50 Mg Tabs (Tramadol Hcl) .... 2 Q6 Hours 12)  Geodon 60 Mg Caps (Ziprasidone Hcl) .... Two Times A Day 13)  Mylanta 200-200-20 Mg/10ml Susp (Alum & Mag Hydroxide-Simeth) .... 30ml Q4 As Needed For Indigestion 14)  Levothroid 100 Mcg Tabs (Levothyroxine Sodium) .... Take 1  Pill Daily 30 Min Prior To Meal 15)  Percocet 5-325 Mg Tabs (Oxycodone-Acetaminophen) .... One Two Times A Day As Needed For Breakthrough Pain 16)  Zantac 150 Mg Tabs (Ranitidine Hcl) .... Take 1 Pill By Mouth Bid  Allergies (verified): No  Known Drug Allergies  Family History: unknown no known family or friends  Review of Systems        vitals reviewed and pertinent negatives and positives seen in HPI   Physical Exam  General:  Well-developed,well-nourished,in no acute distress; alert,appropriate and cooperative throughout examination Head:  Normocephalic and atraumatic without obvious abnormalities. No apparent alopecia or balding. Lungs:  Normal respiratory effort, chest expands symmetrically. Lungs are clear to auscultation, no crackles or wheezes. Heart:  Normal rate and regular rhythm. S1 and S2 normal without gallop, murmur, click, rub or other extra sounds. Abdomen:  soft, non-tender, normal bowel sounds, and no distention.   Msk:  left leg  does not move much, has pain in the hip, decreased range of motion in the left leg.  Extremities:  pt has 3 small healing abrasions on the left outer leg over the knee area.  Neurologic:  alert & oriented X3 and sensation intact to light touch.   Skin:  left knee abrasions Psych:  Cognition and judgment appear intact. Alert and cooperative with normal attention span and concentration. No apparent delusions, illusions, hallucinations at this time, pt pleasant during exam today.    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 Lajoyce Corners of [facility]. He was told he was not a surgical candidate. However, the patient is young, he has good care at [facility], and he is in a significant amount of pain that no amount of pain meds seems to control. [facility] and get a second opinion.   Orders: Orthopedic Surgeon Referral (Ortho Surgeon)  Problem # 2:  HYPOTHYROIDISM (ICD-244.9) Assessment: Deteriorated Pt was on a lower dose of Levothyroxine and had a TSH that was elevated. Then he was switched to a higher dose of Levo and his TSH dropped. He will stay on this current dose and have a repeat TSH in 6 weeks.   His updated medication list for this problem includes:    Levothroid 100 Mcg Tabs (Levothyroxine sodium) .Marland Kitchen... Take 1  pill daily 30 min prior to meal  Problem # 3:  DEPRESSION/ANXIETY (ICD-300.4) Assessment: Unchanged Pt is happy today. He continues  on his psych meds. He has good and bad days but they are better when his pain is controlled.   Problem # 4:  DENTAL CARIES (ICD-521.00) Assessment: Deteriorated Dentist recommended pulling all teeth and getting dentures. This will happen soon.   Problem # 5:  CELLULITIS, LEG, LEFT (ICD-682.6) Assessment: Improved Pt's cellulitis is improved from prior months. He only has 3 areas of healing abrasions. Pt has a  tendency to pick at scabs, but it appears as it they are healing well.   Complete Medication List: 1)  Daily Multiple Vitamins Tabs (Multiple vitamin) .... One daily 2)  Ibuprofen 800 Mg Tabs (Ibuprofen) .... Three times a day 3)  Allopurinol 300 Mg Tabs (Allopurinol) .... Once daily 4)  Thiamine Hcl 100 Mg Tabs (Thiamine hcl) .... Once daily 5)  Oxycontin 20 Mg Xr12h-tab (Oxycodone hcl) .Marland Kitchen.. 1 tab by mouth 2  times a day as needed pain, and 30 mg qhs 6)  Metoprolol Tartrate 25 Mg Tabs (Metoprolol tartrate) .... Two times a day 7)  Amlodipine Besylate 5 Mg Tabs (Amlodipine besylate) .... Once daily 8)  Gabapentin 300 Mg Caps (Gabapentin) .... 2 (600mg ) four times a day 9)  Mirtazapine 15 Mg Tabs (Mirtazapine) .... At bedtime 10)  Effexor  Xr 75 Mg Xr24h-cap (Venlafaxine hcl) .... Two daily 11)  Tramadol Hcl 50 Mg Tabs (Tramadol hcl) .... 2 q6 hours 12)  Geodon 60 Mg Caps (Ziprasidone hcl) .... Two times a day 13)  Mylanta 200-200-20 Mg/33ml Susp (Alum & mag hydroxide-simeth) .... 30ml q4 as needed for indigestion 14)  Levothroid 100 Mcg Tabs (Levothyroxine sodium) .... Take 1  pill daily 30 min prior to meal 15)  Percocet 5-325 Mg Tabs (Oxycodone-acetaminophen) .... One two times a day as needed for breakthrough pain 16)  Zantac 150 Mg Tabs (Ranitidine hcl) .... Take 1 pill by mouth bid

## 2010-10-06 NOTE — Miscellaneous (Signed)
Summary: s/p fall, LLL erythema   Vital Signs:  Patient profile:   55 year old male Temp:     98.4 degrees F Pulse rate:   101 / minute Resp:     22 per minute BP sitting:   165 / 84  Primary Care Provider:  Jamie Brookes MD  CC:  s/p fall and concern about cellulitis. Marland Kitchen  History of Present Illness: Asked to eval patient 2/2 LLE erythema. patient initially admitted with wound issues. constant problem with patient picking wounds. no fever/chills. patient also fell today. his explanation has varied depending on who asks. his explanation to me was that he "got off the path." patient c/o generalized aches and pains and no pain specific to LLE. doxycycline stopped 1 week ago.  Habits & Providers  Alcohol-Tobacco-Diet     Tobacco Status: current     Tobacco Counseling: to quit use of tobacco products  Current Medications (verified): 1)  Daily Multiple Vitamins  Tabs (Multiple Vitamin) .... One Daily 2)  Ibuprofen 800 Mg Tabs (Ibuprofen) .... Three Times A Day 3)  Allopurinol 300 Mg Tabs (Allopurinol) .... Once Daily 4)  Thiamine Hcl 100 Mg Tabs (Thiamine Hcl) .... Once Daily 5)  Megace Oral 40 Mg/ml Susp (Megestrol Acetate) .... 800mg  Daily 6)  Oxycontin 20 Mg Xr12h-Tab (Oxycodone Hcl) .Marland Kitchen.. 1 Tab By Mouth Two Times A Day As Needed Pain 7)  Oxycodone Hcl 5 Mg Caps (Oxycodone Hcl) .Marland Kitchen.. 1 Tab By Mouth 4 Times A Day As Needed Breakthrough Pain 8)  Metoprolol Tartrate 25 Mg Tabs (Metoprolol Tartrate) .... Two Times A Day 9)  Amlodipine Besylate 5 Mg Tabs (Amlodipine Besylate) .... Once Daily 10)  Gabapentin 300 Mg Caps (Gabapentin) .... 2 (600mg ) Three Times A Day 11)  Mirtazapine 15 Mg Tabs (Mirtazapine) .... At Bedtime 12)  Effexor Xr 75 Mg Xr24h-Cap (Venlafaxine Hcl) .... Once Daily 13)  Tramadol Hcl 50 Mg Tabs (Tramadol Hcl) .... 2 Q6 Hours 14)  Geodon 60 Mg Caps (Ziprasidone Hcl) .... Once Daily  Allergies (verified): No Known Drug Allergies  Social History: Smoking Status:   current  Physical Exam  General:  NAD. alert. lying in bed.  Skin:  few scabs on LLE from picking. erythema and warmth of distal L calf, but no relationship to nearby areas of excoriation. mild TTP.    Impression & Recommendations:  Problem # 1:  CELLULITIS, LEG, LEFT (ICD-682.6) will place on bactrim ds 2 tabs by mouth two times a day x10 days.   Problem # 2:  ACCIDENTAL FALL FROM CHAIR [phone]) patient stable. continue current pain med regimen.

## 2010-10-06 NOTE — Assessment & Plan Note (Signed)
Summary: Bizarre behavior, incontinence    Vital Signs:  Patient profile:   55 year old male Height:      76 inches Temp:     99.4 degrees F Pulse rate:   81 / minute Resp:     20 per minute BP sitting:   130 / 72   Primary Care Provider:  Jamie Brookes MD   History of Present Illness: Mr [place] urinated and defecated on the floor. When the nurse asked why, he said because he could. To me he admits only urinary incontinence. The had to remove a coat hanger from his room  because he was using it to dig at himself. He denies hearing voices or feeling fearful, but the staff has observed him talking to his mother and reporting that his bed is tilted. He complains of  constant pain in his back and right hip.    Physical Exam  General:  sitting in chair. Appears ill and uncomfortable.  Msk:  Stands from sitting without asistance, but can't stand erect. Very antalgic on left side. Walked a few steps with a limp. Audible crepitus from joints.   Impression & Recommendations:  Problem # 1:  URINARY INCONTINENCE, MALE (ICD-788.30) May related to lumbar spinal stenosis. rule out recurrence of urinary tract infection  [facility]  Problem # 2:  BIPOLAR DISORDER UNSPECIFIED (ICD-296.80) symptoms of psychosis at times. He's worried about disposition. Psych consultant had suggested referral to [facility], but I believe he is too young. Will have to send to ER for psych eval if can't control behavior. He's on a low Seroquel dose for his age.  Will increase to 25 mg in am and 50 mg at bedtime. Will give first dose of 25 mg this am. Orders: Provider Misc Charge- [facility] (Misc)  Problem # 3:  SPINAL STENOSIS OF LUMBAR REGION (ICD-724.02) Inadequate pain relief may be contributing to behavior. Orders: Provider Misc Charge- [facility] (Misc)  Complete Medication List: 1)  Norvasc 10 Mg Tabs (Amlodipine besylate) .... One daily 2)  Toprol Xl 200 Mg Xr24h-tab  (Metoprolol succinate) .... One daily 3)  Daily Multiple Vitamins Tabs (Multiple vitamin) .... One daily 4)  Oxycontin 20 Mg Xr12h-tab (Oxycodone hcl) .... Tid 5)  Oxycodone Hcl 5 Mg Tabs (Oxycodone hcl) .Marland Kitchen.. 1 tab by mouth three times daily as needed for pain 6)  Ambien 10 Mg Tabs (Zolpidem tartrate) .... At bedtime prn 7)  Lithium Carbonate 300 Mg Caps (Lithium carbonate) .... By mouth three  times a day per psych 8)  Ferrous Sulfate 325 (65 Fe) Mg Tabs (Ferrous sulfate) .... Once daily 9)  Hydroxyzine Hcl 10 Mg Tabs (Hydroxyzine hcl) .... Q6 hours as needed 10)  Vitamin C 500 Mg Tabs (Ascorbic acid) .Marland Kitchen.. 1 two times a day 11)  Zinc Sulfate 220 Mg Tabs (Zinc sulfate) .... One daily 12)  Enlive Liqd (Nutritional supplements) .... One scoop three times a day 13)  Resource Beneprotein Pack (Protein) .Marland Kitchen.. 1 scoop 3 times daily 14)  Ibuprofen 800 Mg Tabs (Ibuprofen) .... One two times a day 15)  Levothyroxine Sodium 100 Mcg Tabs (Levothyroxine sodium) .... One daily 16)  Doxycycline Hyclate 100 Mg Tabs (Doxycycline hyclate) .... Two times a day 17)  Pristiq 100 Mg Xr24h-tab (Desvenlafaxine succinate) .... One daily 18)  Seroquel 50 Mg Tabs (Quetiapine fumarate) .... Take one tab every at bedtime 19)  Seroquel 25 Mg Tabs (Quetiapine fumarate) .... Take one tab every am

## 2010-10-06 NOTE — Miscellaneous (Signed)
Summary: Long acting morphine 1 month trial  Medications Added ORAMORPH SR 30 MG XR12H-TAB (MORPHINE SULFATE) take 1 pill every 6 hours for a total of 120 mg daily.       Clinical Lists Changes  Medications: Changed medication from OXYCONTIN 30 MG XR12H-TAB (OXYCODONE HCL) three times a day to [facility] SR 30 MG XR12H-TAB (MORPHINE SULFATE) take 1 pill every 6 hours for a total of 120 mg daily. - Signed Rx of ORAMORPH SR 30 MG XR12H-TAB (MORPHINE SULFATE) take 1 pill every 6 hours for a total of 120 mg daily.;  #124 x 0;  Signed;  Entered by: Jamie Brookes MD;  Authorized by: Jamie Brookes MD;  Method used: Printed then faxed to    Prescriptions: ORAMORPH SR 30 MG XR12H-TAB (MORPHINE SULFATE) take 1 pill every 6 hours for a total of 120 mg daily.  #124 x 0   Entered and Authorized by:   Jamie Brookes MD   Signed by:   Jamie Brookes MD on 07/14/2010   Method used:   Printed then faxed to ...         RxID:   [id number]  Faxed to the [facility] pharmacy in [place]. I talked to them before I sent it and they are aware it is getting faxed over. Jamie Brookes MD  July 14, 2010 2:03 PM  Appended Document: Long acting morphine 1 month trial     Clinical Lists Changes  Medications: Rx of ORAMORPH SR 30 MG XR12H-TAB (MORPHINE SULFATE) take 1 pill every 6 hours for a total of 120 mg daily.;  #124 x 0;  Signed;  Entered by: Jamie Brookes MD;  Authorized by: Jamie Brookes MD;  Method used: Print then Give to Patient    Prescriptions: ORAMORPH SR 30 MG XR12H-TAB (MORPHINE SULFATE) take 1 pill every 6 hours for a total of 120 mg daily.  #124 x 0   Entered and Authorized by:   Jamie Brookes MD   Signed by:   Jamie Brookes MD on 07/14/2010   Method used:   Print then Give to Patient   RxID:   [id number]

## 2010-10-06 NOTE — Miscellaneous (Signed)
Summary: Readmission note   Vital Signs:  Patient profile:   55 year old male Temp:     98.4 degrees F Pulse rate:   79 / minute Resp:     23 per minute BP supine:   141 / 97  Primary Care Provider:  Jamie Brookes MD   History of Present Illness: Patient readmitted after 4 days involuntary commitment at [facility] following worsening psychotic and bizarre behavior at [place]. This behavior included walking outside in the courtyard in freezing cold weather with no coat on picking up cigarette butts and chewing the tobacco. He was urinating on the beds in his room, flooding toilets, getting agitated, seeing things in his room, etc. During his stay at [facility], he was seen by psychiatry. patient reports that he currently feels a little depressed because he is unable to find some important papers (Drivers License, social security card, etc). Pt also states he is not getting the appropriate amount of pain medication.   Current Medications (verified): 1)  Norvasc 10 Mg Tabs (Amlodipine Besylate) .... One Daily 2)  Toprol Xl 200 Mg Xr24h-Tab (Metoprolol Succinate) .... One Daily 3)  Daily Multiple Vitamins  Tabs (Multiple Vitamin) .... One Daily 4)  Oxycontin 20 Mg Xr12h-Tab (Oxycodone Hcl) .... Tid 5)  Oxycodone Hcl 5 Mg Tabs (Oxycodone Hcl) .Marland Kitchen.. 1 Tab By Mouth Three Times Daily As Needed For Pain 6)  Ambien 10 Mg Tabs (Zolpidem Tartrate) .... At Bedtime Prn 7)  Lithium Carbonate 300 Mg Caps (Lithium Carbonate) .... By Mouth Three  Times A Day Per Psych 8)  Ferrous Sulfate 325 (65 Fe) Mg Tabs (Ferrous Sulfate) .... Once Daily 9)  Hydroxyzine Hcl 10 Mg Tabs (Hydroxyzine Hcl) .... Q6 Hours As Needed 10)  Vitamin C 500 Mg Tabs (Ascorbic Acid) .Marland Kitchen.. 1 Two Times A Day 11)  Zinc Sulfate 220 Mg Tabs (Zinc Sulfate) .... One Daily 12)  Enlive  Liqd (Nutritional Supplements) .... One Scoop Three Times A Day 13)  Resource Beneprotein  Pack (Protein) .Marland Kitchen.. 1 Scoop 3 Times Daily 14)   Ibuprofen 800 Mg Tabs (Ibuprofen) .... One Two Times A Day 15)  Levothyroxine Sodium 100 Mcg Tabs (Levothyroxine Sodium) .... One Daily 16)  Doxycycline Hyclate 100 Mg Tabs (Doxycycline Hyclate) .... Two Times A Day 17)  Pristiq 100 Mg Xr24h-Tab (Desvenlafaxine Succinate) .... One Daily 18)  Seroquel 50 Mg Tabs (Quetiapine Fumarate) .... Take One Tab Every At Bedtime 19)  Seroquel 25 Mg Tabs (Quetiapine Fumarate) .... Take One Tab Every Am  Allergies (verified): No Known Drug Allergies  Past History:  Past medical, surgical, family and social histories (including risk factors) reviewed, and no changes noted (except as noted below).  Past Medical History: Reviewed history from 05/08/2009 and no changes required. Alcohol abuse Chronic anemia Pancytopenia ? history of cirrhosis HTN  Past Surgical History: Reviewed history from 05/08/2009 and no changes required. I&D of LLE and keft buttocks with fasciotomy by Dr. Lajoyce Corners and Dr. Johna Sheriff April 15, 2009 Wound closure of LLE and L buttocks by Dr. Lajoyce Corners 05/02/09 Evacuation of hematoma and reclosure of hip wound Dr. Lajoyce Corners 05/05/09  Family History: Reviewed history from 05/08/2009 and no changes required. unknown  Social History: Reviewed history from 05/08/2009 and no changes required. Homeless Alcoholic  Physical Exam  General:  laying across bed, awake, alert. appears upset Psych:  does not appear to be responding to internal stimuli. does mention a bear he takes care of in courtyard. not anxious or depressed  appearing.    Impression & Recommendations:  Problem # 1:  BIPOLAR DISORDER UNSPECIFIED (ICD-296.80) Assessment Unchanged no changes to regimen made during his hospitalization. continue seroquel, pristiq, lithium.   Problem # 2:  HYPERTENSION, BENIGN ESSENTIAL (ICD-401.1) no changes.   His updated medication list for this problem includes:    Norvasc 10 Mg Tabs (Amlodipine besylate) ..... One daily    Toprol Xl 200 Mg  Xr24h-tab (Metoprolol succinate) ..... One daily  Problem # 3:  HYPOTHYROIDISM (ICD-244.9) no changes  His updated medication list for this problem includes:    Levothyroxine Sodium 100 Mcg Tabs (Levothyroxine sodium) ..... One daily  Problem # 4:  CELLULITIS AND ABSCESS OF LEG EXCEPT FOOT (ICD-682.6) see by Dr. Lajoyce Corners of ortho who has d/c'ed dressing changes.   His updated medication list for this problem includes:    Doxycycline Hyclate 100 Mg Tabs (Doxycycline hyclate) .Marland Kitchen..Marland Kitchen Two times a day  Problem # 5:  BACK PAIN, CHRONIC (ICD-724.5) continue pain med regimen  His updated medication list for this problem includes:    Oxycontin 20 Mg Xr12h-tab (Oxycodone hcl) .Marland Kitchen... Tid    Oxycodone Hcl 5 Mg Tabs (Oxycodone hcl) .Marland Kitchen... 1 tab by mouth three times daily as needed for pain    Ibuprofen 800 Mg Tabs (Ibuprofen) ..... One two times a day

## 2010-10-06 NOTE — Letter (Signed)
Summary: Generic Letter  [facility]  [address]   [place], [place] [address]   Phone: [phone]  Fax: [phone]    07/21/2010  DONEVAN BILLER [address] [place], [place]  [address]  Dear Mr. GASPARD,  Please let your facility know that I recommend a handicap toilet so that you will have less leg strain while transferring to the toilet.       Sincerely,   Jamie Brookes MD

## 2010-10-06 NOTE — Miscellaneous (Signed)
   Clinical Lists Changes  Observations: Added new observation of DM PROGRESS: N/A (04/02/2010 14:12) Added new observation of DM FSREVIEW: N/A (04/02/2010 14:12) Added new observation of LIPID PROGRS: N/A (04/02/2010 14:12) Added new observation of LIPID FSREVW: N/A (04/02/2010 14:12) Added new observation of VLDL: 29 mg/dL (16/06/9603 54:09) Added new observation of LDL: 84 mg/dL (81/19/1478 29:56) Added new observation of HDL: 31 mg/dL (21/30/8657 84:69) Added new observation of TRIGLYC TOT: 144 mg/dL (62/95/2841 32:44) Added new observation of CHOLESTEROL: 144 mg/dL (09/08/7251 66:44)      Prevention & Chronic Care Immunizations   Influenza vaccine: Not documented    Tetanus booster: Not documented    Pneumococcal vaccine: Not documented  Colorectal Screening   Hemoccult: Not documented    Colonoscopy: Not documented  Other Screening   PSA: Not documented   Smoking status: current  (01/13/2010)  Lipids   Total Cholesterol: 144  (04/02/2010)   LDL: 84  (04/02/2010)   LDL Direct: Not documented   HDL: 31  (04/02/2010)   Triglycerides: 144  (04/02/2010)  Hypertension   Last Blood Pressure: 115 / 71  (01/13/2010)   Serum creatinine: 0.66  (04/24/2008)   Serum potassium 3.8  (04/24/2008)  Self-Management Support :   Personal Goals (by the next clinic visit) :      Personal blood pressure goal: 140/90  (03/23/2010)   Hypertension self-management support: Not documented

## 2010-10-06 NOTE — Miscellaneous (Signed)
Summary: PA  received approval for oxycontin from  insurance. faxed approval to [facility] attention [place]. Theresia Lo RN  August 13, 2010 1:38 PM  Clinical Lists Changes

## 2010-10-06 NOTE — Miscellaneous (Signed)
Primary Care Provider:  Jamie Brookes MD   History of Present Illness: Visit to orthopedics Lajoyce Corners) for evauation of his left leg pain.  Xray of L hip demonstrating AVN, Dr. Lajoyce Corners felt that he was not a surgical candidate secondary to his past MRSA infections in that area.  Patient very upset, feels that there is no hope for his recovery.  Describes his pain as severe, especially at night , has spent several nights sitting on the toilet in pain.  Current narcotic dosage is 20 mg Oxycontin three times a day.  He is constantly going to the nurses asking for pain meds, uses tramadol several times a day and ibuprofen several times a day.  Pain occupies all of this thoughts.    Current Medications (verified): 1)  Daily Multiple Vitamins  Tabs (Multiple Vitamin) .... One Daily 2)  Ibuprofen 800 Mg Tabs (Ibuprofen) .... Three Times A Day 3)  Allopurinol 300 Mg Tabs (Allopurinol) .... Once Daily 4)  Thiamine Hcl 100 Mg Tabs (Thiamine Hcl) .... Once Daily 5)  Oxycontin 20 Mg Xr12h-Tab (Oxycodone Hcl) .Marland Kitchen.. 1 Tab By Mouth 2  Times A Day As Needed Pain, and 30 Mg Qhs 6)  Metoprolol Tartrate 25 Mg Tabs (Metoprolol Tartrate) .... Two Times A Day 7)  Amlodipine Besylate 5 Mg Tabs (Amlodipine Besylate) .... Once Daily 8)  Gabapentin 300 Mg Caps (Gabapentin) .... 2 (600mg ) Four Times A Day 9)  Mirtazapine 15 Mg Tabs (Mirtazapine) .... At Bedtime 10)  Effexor Xr 75 Mg Xr24h-Cap (Venlafaxine Hcl) .... Two Daily 11)  Tramadol Hcl 50 Mg Tabs (Tramadol Hcl) .... 2 Q6 Hours 12)  Geodon 60 Mg Caps (Ziprasidone Hcl) .... Two Times A Day 13)  Mylanta 200-200-20 Mg/12ml Susp (Alum & Mag Hydroxide-Simeth) .... 30ml Q4 As Needed For Indigestion 14)  Levothyroxine Sodium 50 Mcg Tabs (Levothyroxine Sodium) .Marland Kitchen.. 1 At Bedtime 15)  Percocet 5-325 Mg Tabs (Oxycodone-Acetaminophen) .... One Two Times A Day As Needed For Breakthrough Pain  Allergies: No Known Drug Allergies  Physical Exam  General:  Alert, upset Msk:   Left hip with severe pain with rotation attempt, fused in fexion.  Left knee with clicking and resistence to ROM.     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  Had previous faciitis in that same area that cultured MRSA, he was treated for months with debreidment and antibiotics by the orthopedic surgeon.  Patient is well known to him.  Pain is a barrier to moving forward, and team has been reluctant to increase narcotics and there will never be enough to treat his pain.  Will need to discuss as a group and consider options for this patient.  Thoughts are tertiary referral, methadone in higher doses, Topamax.  Orders: Provider Misc Charge- [facility] (Misc)  Problem # 2:  BIPOLAR DISORDER UNSPECIFIED (ICD-296.80)  Complicated mental illness, currently on antidepressant at low dose (increased from 75 mg to 150 mg), Geodon 60 mg two times a day.  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  Orders: Provider Misc Charge- [facility] (Misc)  Problem # 4:  PERSON LIVING IN RESIDENTIAL INSTITUTION (ICD-V60.6)  Will likely be insitiutionalized the rest of his life under the current circumstances.  Not a candidate for assisted living as he has high personal care requirements and  unstable mental illness.  Orders: Provider Misc Charge- [facility] (Misc)  Complete Medication List: 1)  Daily Multiple Vitamins Tabs (Multiple vitamin) .... One daily 2)  Ibuprofen 800 Mg Tabs (Ibuprofen) .... Three times a day 3)  Allopurinol 300 Mg Tabs (Allopurinol) .... Once daily 4)  Thiamine Hcl 100 Mg Tabs (Thiamine hcl) .... Once daily 5)  Oxycontin 20 Mg Xr12h-tab (Oxycodone hcl) .Marland Kitchen.. 1 tab by mouth 2  times a day as needed pain, and 30 mg qhs 6)  Metoprolol Tartrate 25 Mg Tabs (Metoprolol  tartrate) .... Two times a day 7)  Amlodipine Besylate 5 Mg Tabs (Amlodipine besylate) .... Once daily 8)  Gabapentin 300 Mg Caps (Gabapentin) .... 2 (600mg ) four times a day 9)  Mirtazapine 15 Mg Tabs (Mirtazapine) .... At bedtime 10)  Effexor Xr 75 Mg Xr24h-cap (Venlafaxine hcl) .... Two daily 11)  Tramadol Hcl 50 Mg Tabs (Tramadol hcl) .... 2 q6 hours 12)  Geodon 60 Mg Caps (Ziprasidone hcl) .... Two times a day 13)  Mylanta 200-200-20 Mg/26ml Susp (Alum & mag hydroxide-simeth) .... 30ml q4 as needed for indigestion 14)  Levothyroxine Sodium 50 Mcg Tabs (Levothyroxine sodium) .Marland Kitchen.. 1 at bedtime 15)  Percocet 5-325 Mg Tabs (Oxycodone-acetaminophen) .... One two times a day as needed for breakthrough pain

## 2010-10-06 NOTE — Assessment & Plan Note (Signed)
Summary: hosp f/u: acute renal failure, hip pain, meds   Vital Signs:  Patient profile:   55 year old male Height:      72 inches Weight:      236.5 pounds BMI:     32.19 Pulse rate:   76 / minute BP sitting:   105 / 66  (right arm)  Vitals Entered By: Arlyss Repress CMA, (June 16, 2010 10:06 AM) CC: HOSPITAL F/UP: AKI, pain   Primary Care Provider:  Jamie Brookes MD  CC:  HOSPITAL F/UP: AKI and pain.  History of Present Illness: Pt was recently in the hospital for urosepsis. When he was better he was sent to a different facility (he was at [facility] and is now at [facility] assisted living facility). He is doing well and is urinating well now. He is here to follow up on several issues including:  Acute Renal Failure; During his hospitalization his Cr went from 3.32 to 1.53 on discharge. He comes in today with no further urinary/kidney complaints. He is drinking a lot of water but wants a letter stating that he can have a pitcher of water in his room so he doesn't have to wheel back and forth for cups of water. I am happy to do this. He had a PSA that was checked while he was in the hospital. It was 1.2.   Hip Pain: The patient has chronic pain issues. He is confined to a wheel chair because he has necrosis of the femoral head and can no longer walk. He takes Oxycontin and Oxycodone for his pain. He says it is fairly well controlled on this regimine. He is of the understanding that he only can have 6 medications through [facility] for 1 dollar each and he wants all meds that are not essential eliminated. Pt says he is not using Ibuprofen or APAP and that Tramadol does not help him much.   I will need to look further into this medication issue. THis patient has significant behavioural issues and needs many of these meds. A pharmacy student worked on this issue for over 1/2 hour and was not able to find out how to get this patient all his meds that he needs for 1 dollar each. I suspect he  will have to pay something for his meds over 6 but that he actually needs some of them. I will discuss with Paulino Rily our in house pharmacist.   Habits & Providers  Alcohol-Tobacco-Diet     Tobacco Status: quit > 6 months     Tobacco Counseling: not to resume use of tobacco products  Comments: Pt is chewing tobacco instead of smoking it   Current Medications (verified): 1)  Daily Multiple Vitamins  Tabs (Multiple Vitamin) .... One Daily 2)  Ibuprofen 800 Mg Tabs (Ibuprofen) .... Three Times A Day As Needed 3)  Allopurinol 300 Mg Tabs (Allopurinol) .... Once Daily 4)  Metoprolol Tartrate 25 Mg Tabs (Metoprolol Tartrate) .... Two Times A Day 5)  Amlodipine Besylate 5 Mg Tabs (Amlodipine Besylate) .... Once Daily 6)  Gabapentin 300 Mg Caps (Gabapentin) .... 2 (600mg ) Four Times A Day 7)  Effexor Xr 150 Mg Xr24h-Cap (Venlafaxine Hcl) .Marland Kitchen.. 1 By Mouth Qday 8)  Tramadol Hcl 50 Mg Tabs (Tramadol Hcl) .... 2 Q6 Hours 9)  Percocet 5-325 Mg Tabs (Oxycodone-Acetaminophen) .... One Two Times A Day As Needed For Breakthrough Pain 10)  Zantac 150 Mg Tabs (Ranitidine Hcl) .... Take 1 Pill By Mouth Bid 11)  Oxycontin  30 Mg Xr12h-Tab (Oxycodone Hcl) .... Three Times A Day 12)  Cyclobenzaprine Hcl 10 Mg Tabs (Cyclobenzaprine Hcl) .... Three Times A Day As Needed Muscle Spasms 13)  Nicoderm Cq 21 Mg/24hr Pt24 (Nicotine) 14)  Geodon 80 Mg Caps (Ziprasidone Hcl) .... Two Times A Day 15)  Levothroid 125 Mcg Tabs (Levothyroxine Sodium) .... Once Daily 16)  Tamsulosin Hcl 0.4 Mg Caps (Tamsulosin Hcl) .Marland Kitchen.. 1 Cap Daily At Bedtime 17)  Acetaminophen 650 Mg Supp (Acetaminophen) .Marland Kitchen.. 1 Rectally Ever 4 Hours As Needed For Pain  Allergies (verified): No Known Drug Allergies  Past History:  Past Surgical History: I&D of LLE and keft buttocks with fasciotomy by Dr. Lajoyce Corners and Dr. Johna Sheriff April 15, 2009 Wound closure of LLE and L buttocks by Dr. Lajoyce Corners 05/02/09 Evacuation of hematoma and reclosure of hip wound Dr.  Lajoyce Corners 05/05/09 Pt evaluated at [facility] for further surgery options and no one will do any futher surgery on him because of h/o wound infection.   Social History: Homeless, now a resident at [facility] assisted living facility Alcoholic-drink free since institutionalized Tobacco abuse-changed from smoking to chewingSmoking Status:  quit > 6 months  Review of Systems        vitals reviewed and pertinent negatives and positives seen in HPI   Physical Exam  General:  Well-developed,well-nourished,in no acute distress; alert,appropriate and cooperative throughout examination Mouth:  adentulous Lungs:  Normal respiratory effort, chest expands symmetrically. Lungs are clear to auscultation, no crackles or wheezes. Heart:  Normal rate and regular rhythm. S1 and S2 normal without gallop, murmur, click, rub or other extra sounds. Abdomen:  Bowel sounds positive,abdomen soft and non-tender without masses, organomegaly or hernias noted. Psych:  Cognition appears intact. Alert and cooperative with normal attention span and concentration. No apparent delusions, illusions, hallucinations. Pt does not seem to have good insite or judgement when it comes to his mental health. He thinks he does not need all his current psych meds.    Impression & Recommendations:  Problem # 1:  RENAL FAILURE, ACUTE (ICD-584.9) Assessment Improved Pt is urinating well. No further problems. We plan to recheck a BMET today to check his kidney function.   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 Paperwork sent in to get his scooter approved. Working on getting his Oxycontine and Oxycodone approved. not a surgical candidate in the future.   Orders: [facility]- Est Level  3 ([id number])  Complete Medication List: 1)  Daily Multiple Vitamins Tabs (Multiple  vitamin) .... One daily 2)  Ibuprofen 800 Mg Tabs (Ibuprofen) .... Three times a day as needed 3)  Allopurinol 300 Mg Tabs (Allopurinol) .... Once daily 4)  Metoprolol Tartrate 25 Mg Tabs (Metoprolol tartrate) .... Two times a day 5)  Amlodipine Besylate 5 Mg Tabs (Amlodipine besylate) .... Once daily 6)  Gabapentin 300 Mg Caps (Gabapentin) .... 2 (600mg ) four times a day 7)  Effexor Xr 150 Mg Xr24h-cap (Venlafaxine hcl) .Marland Kitchen.. 1 by mouth qday 8)  Tramadol Hcl 50 Mg Tabs (Tramadol hcl) .... 2 q6 hours 9)  Percocet 5-325 Mg Tabs (Oxycodone-acetaminophen) .... One two times a day as needed for breakthrough pain 10)  Zantac 150 Mg Tabs (Ranitidine hcl) .... Take 1 pill by mouth bid 11)  Oxycontin 30 Mg Xr12h-tab (Oxycodone hcl) .... Three times a day 12)  Cyclobenzaprine Hcl 10 Mg Tabs (Cyclobenzaprine hcl) .... Three times a day as needed muscle spasms 13)  Nicoderm Cq 21 Mg/24hr Pt24 (Nicotine) 14)  Geodon 80 Mg Caps (Ziprasidone hcl) .... Two times a day 15)  Levothroid 125 Mcg Tabs (Levothyroxine sodium) .... Once daily 16)  Tamsulosin Hcl 0.4 Mg Caps (Tamsulosin hcl) .Marland Kitchen.. 1 cap daily at bedtime 17)  Acetaminophen 650 Mg Supp (Acetaminophen) .Marland Kitchen.. 1 rectally ever 4 hours as needed for pain  Patient Instructions: 1)  It was good to see you today.  2)  Call your dentist to find out about your dentures.  3)  I will be calling your  nursing home to find out about your meds.  4)  I will see you in December to recheck your blood pressure and to do lab work.

## 2010-10-06 NOTE — Progress Notes (Signed)
Summary: meds prob   Phone Note Call from Patient   Caller: [facility] Summary of Call: needs PA to be able to get his Oxycontin - they can't get it filled until PA is done Initial call taken by: De Nurse,  June 24, 2010 9:12 AM  Follow-up for Phone Call        the prior auth is in the pcp chart box Follow-up by: Golden Circle RN,  June 24, 2010 9:38 AM  Additional Follow-up for Phone Call Additional follow up Details #1::        I faxed in his PA from the hospital at 2:55pm today called Lurena Joiner at [facility] and let her know that I am sending it in. Asked her about the idea that he has that he can only get 6 meds. She says that is not true and that they will fill out paperwork to get all his meds and I do not need to delete any of the.  Additional Follow-up by: Jamie Brookes MD,  June 24, 2010 2:58 PM

## 2010-10-06 NOTE — Miscellaneous (Signed)
Primary Care Provider:  Jamie Brookes MD   History of Present Illness: Accepting the fact that Jeffrey [name] is not a candidate for a hip replacement.  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  Pain management was recommended, we have accomplished this to a large degree with Oxycontin 30 mg q 8 hours.  Jeffrey [name] is unable to bear weight, but appears comfortable in non-weight bearing positions.  Will have all teeth pulled on 7/15, and be fitted for dentures.  Behavior has been well controlled on Geodon.  Jeffrey [name] has gained 40 pounds since March 2011.  Remeron was discontinued.  Current Medications (verified): 1)  Daily Multiple Vitamins  Tabs (Multiple Vitamin) .... One Daily 2)  Ibuprofen 800 Mg Tabs (Ibuprofen) .... Three Times A Day As Needed 3)  Allopurinol 300 Mg Tabs (Allopurinol) .... Once Daily 4)  Metoprolol Tartrate 25 Mg Tabs (Metoprolol Tartrate) .... Two Times A Day 5)  Amlodipine Besylate 5 Mg Tabs (Amlodipine Besylate) .... Once Daily 6)  Gabapentin 300 Mg Caps (Gabapentin) .... 2 (600mg ) Four Times A Day 7)  Effexor Xr 75 Mg Xr24h-Cap (Venlafaxine Hcl) .... Two Daily 8)  Tramadol Hcl 50 Mg Tabs (Tramadol Hcl) .... 2 Q6 Hours 9)  Geodon 60 Mg Caps (Ziprasidone Hcl) .... Two Times A Day 10)  Mylanta 200-200-20 Mg/36ml Susp (Alum & Mag Hydroxide-Simeth) .... 30ml Q4 As Needed For Indigestion 11)  Levothroid 100 Mcg Tabs (Levothyroxine Sodium) .... Take 1  Pill Daily 30 Min Prior To Meal 12)  Percocet 5-325 Mg Tabs (Oxycodone-Acetaminophen) .... One Two Times A Day As Needed For Breakthrough Pain 13)  Zantac 150 Mg Tabs (Ranitidine Hcl) .... Take 1 Pill By Mouth Bid 14)  Oxycontin 30 Mg Xr12h-Tab (Oxycodone Hcl) .... Three Times A Day 15)  Cyclobenzaprine Hcl 10 Mg Tabs (Cyclobenzaprine Hcl) .... Three Times A Day As Needed Muscle Spasms  Allergies (verified): No Known Drug  Allergies  Past History:  Past Medical History: Alcohol abuse Chronic anemia Pancytopenia ? 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   Lungs:  normal respiratory effort and normal breath sounds.   Heart:  normal rate, regular rhythm, and no murmur.   Skin:  multiple picking lesions, some bleeding Psych:  good eye contact and interactive.   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)  Not a candidate for replacement per three orthopedic surgeons, manage pain with narcotics, wheelchair for mobility  Orders: Provider Misc Charge- [facility] (Misc)  Problem # 2:  DENTAL CARIES (ICD-521.00)  Full mouth teeth extraction scheduled 03/20/10  Orders: Provider Misc Charge- [facility] (Misc)  Problem # 3:  BIPOLAR DISORDER UNSPECIFIED (ICD-296.80)  Well controlled on Geodon, Remeron recently discontinued secondary to 40 pound weight gain  Orders: Provider Misc Charge- [facility] (Misc)  Problem # 4:  HYPOTHYROIDISM (ICD-244.9)  TSH in the 23 range, increase levothyroid to 150 mg and recheck in 6 weeks His updated medication list for this problem includes:    Levothroid 150 Mcg Tabs (Levothyroxine sodium) ..... One daily  Orders: Provider Misc Charge- [facility] (Misc)  Problem # 5:  BACK PAIN, CHRONIC (ICD-724.5) add as  needed cyclobenzaprine His updated medication list for this problem includes:    Ibuprofen 800 Mg Tabs (Ibuprofen) .Marland Kitchen... Three times a day as needed    Tramadol Hcl 50 Mg Tabs (Tramadol hcl) .Marland Kitchen... 2 q6 hours    Percocet 5-325 Mg Tabs (Oxycodone-acetaminophen) ..... One two times a day as needed for breakthrough pain    Oxycontin 30 Mg Xr12h-tab (Oxycodone hcl) .Marland Kitchen... Three times a day     Cyclobenzaprine Hcl 10 Mg Tabs (Cyclobenzaprine hcl) .Marland Kitchen... Three times a day as needed muscle spasms  Complete Medication List: 1)  Daily Multiple Vitamins Tabs (Multiple vitamin) .... One daily 2)  Ibuprofen 800 Mg Tabs (Ibuprofen) .... Three times a day as needed 3)  Allopurinol 300 Mg Tabs (Allopurinol) .... Once daily 4)  Metoprolol Tartrate 25 Mg Tabs (Metoprolol tartrate) .... Two times a day 5)  Amlodipine Besylate 5 Mg Tabs (Amlodipine besylate) .... Once daily 6)  Gabapentin 300 Mg Caps (Gabapentin) .... 2 (600mg ) four times a day 7)  Effexor Xr 75 Mg Xr24h-cap (Venlafaxine hcl) .... Two daily 8)  Tramadol Hcl 50 Mg Tabs (Tramadol hcl) .... 2 q6 hours 9)  Geodon 60 Mg Caps (Ziprasidone hcl) .... Two times a day 10)  Mylanta 200-200-20 Mg/37ml Susp (Alum & mag hydroxide-simeth) .... 30ml q4 as needed for indigestion 11)  Levothroid 150 Mcg Tabs (Levothyroxine sodium) .... One daily 12)  Percocet 5-325 Mg Tabs (Oxycodone-acetaminophen) .... One two times a day as needed for breakthrough pain 13)  Zantac 150 Mg Tabs (Ranitidine hcl) .... Take 1 pill by mouth bid 14)  Oxycontin 30 Mg Xr12h-tab (Oxycodone hcl) .... Three times a day 15)  Cyclobenzaprine Hcl 10 Mg Tabs (Cyclobenzaprine hcl) .... Three times a day as needed muscle spasms

## 2010-10-06 NOTE — Progress Notes (Signed)
   Phone Note Other Incoming   Caller: [facility] Details for Reason: Pain meds Summary of Call: [facility] [place] pt states pain meds not working, per nursing getting Oxycontin 20mg  three times a day an Ibuprofen. Pt complaining of pain, non particular, VSS, afebrile. Pt upset. Given one time breakthru dose of Oxycodone 5mg  x 1. Will forward to [facility] in AM. Initial call taken by: Milinda Antis MD,  December 05, 2009 12:16 AM

## 2010-10-06 NOTE — Miscellaneous (Signed)
Summary: Evaluation for Power Scooter   Vital Signs:  Patient profile:   55 year old male Height:      72 inches Weight:      220 pounds BMI:     29.95  Primary Care Provider:  Jamie Brookes MD   History of Present Illness: Jeffrey [name] has requested evaluation for a power scooter so that he can function at a wheelchair level in the facility and in the community.  He has been evaluated by 3 orthopedic specialists who all have denied hip replacement because of high risk of complications.  Left hip with complete distruction of the femoral head, associated with MRSA necrotizing facitis. He also has significant lumbar spinal stenosis that interferes with his mobilllity.  Left knee with advanced DJD.  He is unable to functionally use his left leg, making ADLs difficult.  He has been using a manual wheelchair but limited in mobility as he does have overall deficits in function because of psychotropic medication therapy used to treat bipolar psychotic illness.   Current Medications (verified): 1)  Daily Multiple Vitamins  Tabs (Multiple Vitamin) .... One Daily 2)  Ibuprofen 800 Mg Tabs (Ibuprofen) .... Three Times A Day As Needed 3)  Allopurinol 300 Mg Tabs (Allopurinol) .... Once Daily 4)  Metoprolol Tartrate 25 Mg Tabs (Metoprolol Tartrate) .... Two Times A Day 5)  Amlodipine Besylate 5 Mg Tabs (Amlodipine Besylate) .... Once Daily 6)  Gabapentin 300 Mg Caps (Gabapentin) .... 2 (600mg ) Four Times A Day 7)  Effexor Xr 75 Mg Xr24h-Cap (Venlafaxine Hcl) .... Two Daily 8)  Tramadol Hcl 50 Mg Tabs (Tramadol Hcl) .... 2 Q6 Hours 9)  Mylanta 200-200-20 Mg/83ml Susp (Alum & Mag Hydroxide-Simeth) .... 30ml Q4 As Needed For Indigestion 10)  Percocet 5-325 Mg Tabs (Oxycodone-Acetaminophen) .... One Two Times A Day As Needed For Breakthrough Pain 11)  Zantac 150 Mg Tabs (Ranitidine Hcl) .... Take 1 Pill By Mouth Bid 12)  Oxycontin 30 Mg Xr12h-Tab (Oxycodone Hcl) .... Three Times A Day 13)  Cyclobenzaprine Hcl  10 Mg Tabs (Cyclobenzaprine Hcl) .... Three Times A Day As Needed Muscle Spasms 14)  Nicoderm Cq 21 Mg/24hr Pt24 (Nicotine) 15)  Geodon 80 Mg Caps (Ziprasidone Hcl) .... Two Times A Day 16)  Levothroid 125 Mcg Tabs (Levothyroxine Sodium) .... Once Daily 17)  Peridex 0.12 % Soln (Chlorhexidine Gluconate) .... Swish and Spit - 15 Ml For 30 Seconds  Two Times A Day  Allergies (verified): No Known Drug Allergies  Review of Systems General:  Complains of fatigue and weakness. MS:  Complains of joint pain, loss of strength, low back pain, and stiffness. Neuro:  Complains of disturbances in coordination, poor balance, and weakness.  Physical Exam  General:  Middle aged male confined to wheelchair, propelling self very slowly. Lungs:  normal respiratory effort and normal breath sounds.   Heart:  normal rate and regular rhythm.   Msk:  severe crepitus of all LE joints, no hip ROM on the left, attempts cause severe pain.  Bent over posture when standing on one leg. Neurologic:  Diffuse ridigity and bradykinesia. Skin:  small picking lesions, scars all over the body form this behavior. Psych:  labile affect and angry.     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  Major limiting factor in functional mobility.  Chronic pain associated.  Orders: Provider Misc Charge- [facility] (Misc)  Problem # 2:  SPINAL STENOSIS OF LUMBAR REGION (ICD-724.02)  Limits  function as he leans forward to reduce pain, taking away his center of gravity.  Orders: Provider Misc Charge- [facility] (Misc)  Problem # 3:  ACCIDENTAL FALL FROM CHAIR [phone]) History of such  Problem # 4:  BIPOLAR DISORDER UNSPECIFIED (ICD-296.80)  On antipsychotics that have EPS side effects limiting his reaction time, and overall fluid movements. He is stable on these medicaions.  Orders: Provider Misc Charge- [facility] (Misc)  Problem # 5:  OSTEOARTHRITIS, KNEE, LEFT  (ICD-715.96)  His updated medication list for this problem includes:    Ibuprofen 800 Mg Tabs (Ibuprofen) .Marland Kitchen... Three times a day as needed    Tramadol Hcl 50 Mg Tabs (Tramadol hcl) .Marland Kitchen... 2 q6 hours    Percocet 5-325 Mg Tabs (Oxycodone-acetaminophen) ..... One two times a day as needed for breakthrough pain    Oxycontin 30 Mg Xr12h-tab (Oxycodone hcl) .Marland Kitchen... Three times a day    Cyclobenzaprine Hcl 10 Mg Tabs (Cyclobenzaprine hcl) .Marland Kitchen... Three times a day as needed muscle spasms  Orders: Provider Misc Charge- [facility] (Misc)  Complete Medication List: 1)  Daily Multiple Vitamins Tabs (Multiple vitamin) .... One daily 2)  Ibuprofen 800 Mg Tabs (Ibuprofen) .... Three times a day as needed 3)  Allopurinol 300 Mg Tabs (Allopurinol) .... Once daily 4)  Metoprolol Tartrate 25 Mg Tabs (Metoprolol tartrate) .... Two times a day 5)  Amlodipine Besylate 5 Mg Tabs (Amlodipine besylate) .... Once daily 6)  Gabapentin 300 Mg Caps (Gabapentin) .... 2 (600mg ) four times a day 7)  Effexor Xr 75 Mg Xr24h-cap (Venlafaxine hcl) .... Two daily 8)  Tramadol Hcl 50 Mg Tabs (Tramadol hcl) .... 2 q6 hours 9)  Mylanta 200-200-20 Mg/71ml Susp (Alum & mag hydroxide-simeth) .... 30ml q4 as needed for indigestion 10)  Percocet 5-325 Mg Tabs (Oxycodone-acetaminophen) .... One two times a day as needed for breakthrough pain 11)  Zantac 150 Mg Tabs (Ranitidine hcl) .... Take 1 pill by mouth bid 12)  Oxycontin 30 Mg Xr12h-tab (Oxycodone hcl) .... Three times a day 13)  Cyclobenzaprine Hcl 10 Mg Tabs (Cyclobenzaprine hcl) .... Three times a day as needed muscle spasms 14)  Nicoderm Cq 21 Mg/24hr Pt24 (Nicotine) 15)  Geodon 80 Mg Caps (Ziprasidone hcl) .... Two times a day 16)  Levothroid 125 Mcg Tabs (Levothyroxine sodium) .... Once daily 17)  Peridex 0.12 % Soln (Chlorhexidine gluconate) .... Swish and spit - 15 ml for 30 seconds  two times a day  Appended Document: Evaluation for Power Scooter i agree with him gettin a  scooter. Please let me know if there is any form I need to sign. {USER.REALNAME}  {DATETIMESTAMP()}

## 2010-10-06 NOTE — Assessment & Plan Note (Signed)
Summary: L leg swelling, obesity    Vital Signs:  Patient profile:   55 year old [name] Height:      76 inches Weight:      218 pounds    Primary Care Provider:  Jamie Brookes MD   History of Present Illness: We had a note from [facility] recommending discontinuing the Mirtazepine due to his weight gain. They also recommended increasing his Geodon to improve his behavior. We've not heard of recent behavioral problems from the staff.   He continues to have much left leg pain and is awaiting recommendations from [facility] orthopedists for possible surgery.   Yesterday, Dr Georgiana Shore saw him for LLE redness, tenderness and swelling that had begun the day before. He finished a 14 day course of Doxycycline on 6/20 for RLE cellulitis   -  Date:  03/04/2010    Weight 218  Date:  02/04/2010    Weight 223    Physical Exam  General:  alert.   Msk:  left leg does not move much, has pain in the hip, decreased range of motion in the left leg. Crepitus felt with hip movement Extremities:  LLE anterolateral swelling to midshin, tender to touch.  Psych:  Cognition and judgment appear intact. Alert and cooperative with normal attention span and concentration. No apparent delusions, illusions, hallucinations at this time, pt pleasant during exam today.     Impression & Recommendations:  Problem # 1:  CELLULITIS, LEG, LEFT (ICD-682.6)  Orders: Provider Misc Charge- [facility] (Misc)  His updated medication list for this problem includes:    Doxycycline Hyclate 100 Mg Tabs (Doxycycline hyclate) .Marland Kitchen... 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 Encouraged calorie restriction Orders: Provider Misc Charge- [facility] (Misc)  Problem # 4:  BIPOLAR DISORDER UNSPECIFIED (ICD-296.80) Deferred increasing Geodon Orders: Provider Misc Charge- [facility] (Misc)  Complete  Medication List: 1)  Daily Multiple Vitamins Tabs (Multiple vitamin) .... One daily 2)  Ibuprofen 800 Mg Tabs (Ibuprofen) .... Three times a day as needed 3)  Allopurinol 300 Mg Tabs (Allopurinol) .... Once daily 4)  Metoprolol Tartrate 25 Mg Tabs (Metoprolol tartrate) .... Two times a day 5)  Amlodipine Besylate 5 Mg Tabs (Amlodipine besylate) .... Once daily 6)  Gabapentin 300 Mg Caps (Gabapentin) .... 2 (600mg ) four times a day 7)  Effexor Xr 75 Mg Xr24h-cap (Venlafaxine hcl) .... Two daily 8)  Tramadol Hcl 50 Mg Tabs (Tramadol hcl) .... 2 q6 hours 9)  Geodon 60 Mg Caps (Ziprasidone hcl) .... Two times a day 10)  Mylanta 200-200-20 Mg/37ml Susp (Alum & mag hydroxide-simeth) .... 30ml q4 as needed for indigestion 11)  Levothroid 100 Mcg Tabs (Levothyroxine sodium) .... Take 1  pill daily 30 min prior to meal 12)  Percocet 5-325 Mg Tabs (Oxycodone-acetaminophen) .... One two times a day as needed for breakthrough pain 13)  Zantac 150 Mg Tabs (Ranitidine hcl) .... Take 1 pill by mouth bid 14)  Oxycontin 30 Mg Xr12h-tab (Oxycodone hcl) .... Three times a day 15)  Cyclobenzaprine Hcl 10 Mg Tabs (Cyclobenzaprine hcl) .... Three times a day as needed muscle spasms 16)  Doxycycline Hyclate 100 Mg Tabs (Doxycycline hyclate) .... Take one tab bid     Prevention & Chronic Care Immunizations   Influenza vaccine: Not documented    Tetanus booster: Not documented    Pneumococcal vaccine: Not documented  Colorectal Screening  Hemoccult: Not documented    Colonoscopy: Not documented  Other Screening   PSA: Not documented   Smoking status: current  (01/13/2010)  Lipids   Total Cholesterol: Not documented   LDL: Not documented   LDL Direct: Not documented   HDL: Not documented   Triglycerides: Not documented  Hypertension   Last Blood Pressure: 115 / 71  (01/13/2010)   Serum creatinine: 0.66  (04/24/2008)   Serum potassium 3.8  (04/24/2008)  Self-Management Support :     Hypertension self-management support: Not documented     Current Medications (verified): 1)  Daily Multiple Vitamins  Tabs (Multiple Vitamin) .... One Daily 2)  Ibuprofen 800 Mg Tabs (Ibuprofen) .... Three Times A Day As Needed 3)  Allopurinol 300 Mg Tabs (Allopurinol) .... Once Daily 4)  Metoprolol Tartrate 25 Mg Tabs (Metoprolol Tartrate) .... Two Times A Day 5)  Amlodipine Besylate 5 Mg Tabs (Amlodipine Besylate) .... Once Daily 6)  Gabapentin 300 Mg Caps (Gabapentin) .... 2 (600mg ) Four Times A Day 7)  Effexor Xr 75 Mg Xr24h-Cap (Venlafaxine Hcl) .... Two Daily 8)  Tramadol Hcl 50 Mg Tabs (Tramadol Hcl) .... 2 Q6 Hours 9)  Geodon 60 Mg Caps (Ziprasidone Hcl) .... Two Times A Day 10)  Mylanta 200-200-20 Mg/69ml Susp (Alum & Mag Hydroxide-Simeth) .... 30ml Q4 As Needed For Indigestion 11)  Levothroid 100 Mcg Tabs (Levothyroxine Sodium) .... Take 1  Pill Daily 30 Min Prior To Meal 12)  Percocet 5-325 Mg Tabs (Oxycodone-Acetaminophen) .... One Two Times A Day As Needed For Breakthrough Pain 13)  Zantac 150 Mg Tabs (Ranitidine Hcl) .... Take 1 Pill By Mouth Bid 14)  Oxycontin 30 Mg Xr12h-Tab (Oxycodone Hcl) .... Three Times A Day 15)  Cyclobenzaprine Hcl 10 Mg Tabs (Cyclobenzaprine Hcl) .... Three Times A Day As Needed Muscle Spasms 16)  Doxycycline Hyclate 100 Mg Tabs (Doxycycline Hyclate) .... Take One Tab Bid  Allergies (verified): No Known Drug Allergies   Appended Document: L leg swelling, obesity     Clinical Lists Changes  Observations: Added new observation of HTN PROGRESS: At goal (03/23/2010 15:27) Added new observation of HTN FSREVIEW: Yes (03/23/2010 15:27) Added new observation of PERSBPGOAL: 140/90 (03/23/2010 15:27) Added new observation of DM PROGRESS: N/A (03/23/2010 15:27) Added new observation of DM FSREVIEW: N/A (03/23/2010 15:27) Added new observation of LIPID PROGRS: N/A (03/23/2010 15:27) Added new observation of LIPID FSREVW: N/A (03/23/2010  15:27)       Prevention & Chronic Care Immunizations   Influenza vaccine: Not documented    Tetanus booster: Not documented    Pneumococcal vaccine: Not documented  Colorectal Screening   Hemoccult: Not documented    Colonoscopy: Not documented  Other Screening   PSA: Not documented   Smoking status: current  (01/13/2010)  Lipids   Total Cholesterol: Not documented   LDL: Not documented   LDL Direct: Not documented   HDL: Not documented   Triglycerides: Not documented  Hypertension   Last Blood Pressure: 115 / 71  (01/13/2010)   Serum creatinine: 0.66  (04/24/2008)   Serum potassium 3.8  (04/24/2008)    Hypertension flowsheet reviewed?: Yes   Progress toward BP goal: At goal  Self-Management Support :   Personal Goals (by the next clinic visit) :      Personal blood pressure goal: 140/90  (03/23/2010)   Hypertension self-management support: Not documented

## 2010-10-06 NOTE — Assessment & Plan Note (Signed)
Summary: med update   

## 2010-10-06 NOTE — Miscellaneous (Signed)
Summary: Oxycodone XR refill  Medications Added OXYCONTIN 20 MG XR12H-TAB (OXYCODONE HCL) 1 tab by mouth 3  times a day as needed pain       Clinical Lists Changes  Medications: Changed medication from OXYCONTIN 20 MG XR12H-TAB (OXYCODONE HCL) 1 tab by mouth 3  times a day as needed pain to OXYCONTIN 20 MG XR12H-TAB (OXYCODONE HCL) 1 tab by mouth 3  times a day as needed pain - Signed Rx of OXYCONTIN 20 MG XR12H-TAB (OXYCODONE HCL) 1 tab by mouth 3  times a day as needed pain;  #180 x 0;  Signed;  Entered by: Jamie Brookes MD;  Authorized by: Jamie Brookes MD;  Method used: Handwritten    Prescriptions: OXYCONTIN 20 MG XR12H-TAB (OXYCODONE HCL) 1 tab by mouth 3  times a day as needed pain  #180 x 0   Entered and Authorized by:   Jamie Brookes MD   Signed by:   Jamie Brookes MD on 01/05/2010   Method used:   Handwritten   RxID:   [id number]

## 2010-10-06 NOTE — Assessment & Plan Note (Signed)
Summary: NH visit / knee injection   Primary Care Provider:  Jamie Brookes MD  CC:  L knee pain.  History of Present Illness: 1. L knee pain - seen 3/16 and 3/17 Pt with chronic crepitus, pain in L knee. Controlled with oxycontin and as needed oxycodone. Pt states that his pain is severe and requests increased dose of his as needed oxycodone. Pt also would like his scheduled oxycodone moved to earlier in the day as he wakes up about 6 am and doesn't get the oxycontin until about 8 am. Would like them at 7 and 7. Is open to joint injection.   Current Medications (verified): 1)  Daily Multiple Vitamins  Tabs (Multiple Vitamin) .... One Daily 2)  Ibuprofen 800 Mg Tabs (Ibuprofen) .... Three Times A Day 3)  Allopurinol 300 Mg Tabs (Allopurinol) .... Once Daily 4)  Thiamine Hcl 100 Mg Tabs (Thiamine Hcl) .... Once Daily 5)  Megace Oral 40 Mg/ml Susp (Megestrol Acetate) .... 800mg  Daily 6)  Oxycontin 20 Mg Xr12h-Tab (Oxycodone Hcl) .Marland Kitchen.. 1 Tab By Mouth Two Times A Day As Needed Pain 7)  Oxycodone Hcl 5 Mg Caps (Oxycodone Hcl) .Marland Kitchen.. 1 Tab By Mouth 4 Times A Day As Needed Breakthrough Pain 8)  Metoprolol Tartrate 25 Mg Tabs (Metoprolol Tartrate) .... Two Times A Day 9)  Amlodipine Besylate 5 Mg Tabs (Amlodipine Besylate) .... Once Daily 10)  Gabapentin 300 Mg Caps (Gabapentin) .... 2 (600mg ) Three Times A Day 11)  Mirtazapine 15 Mg Tabs (Mirtazapine) .... At Bedtime 12)  Effexor Xr 75 Mg Xr24h-Cap (Venlafaxine Hcl) .... Once Daily 13)  Tramadol Hcl 50 Mg Tabs (Tramadol Hcl) .... 2 Q6 Hours 14)  Geodon 60 Mg Caps (Ziprasidone Hcl) .... Once Daily  Allergies (verified): No Known Drug Allergies  Review of Systems       chronic back, hip and L knee pain. otherwise balance of ROS is negative.   Physical Exam  General:  no acute distress, alert, appropriate.  Msk:  L knee - hypertrophic joint. Lacks  ~ 25  degress of full extension. Severe, audible crepitus with flexion and extension maneuvers.  Patellar mobility is normal. No obvious effusion. External there are open, scabbing sores with signs of recent excoriation (and corresponding blood on his fingertips).  Additional Exam:  Patient given informed consent for injection. Appropriate verbal time out taken. Area cleaned and prepped in usual sterile fashion. 1 cc kennalog 40 plus 8 cc 1% lidocaine without epinephrine was injected into the L knee joint. Patient tolerated procedure well without any apparent complications. Good hemostasis.    Impression & Recommendations:  Problem # 1:  OSTEOARTHRITIS, KNEE, LEFT (ICD-715.96) exam c/w advanced degenerative joint disease of L knee. Will continue current pain regimen except change dosing schedule of oxycontin to 7 am and 7 pm. Pt is agreeable to this. Injection performed as well. Will monitor for pain. Advised pt that goal is not to be pain free as this is unrealistic, but to get control of the pain as much as possible. Would not escalate narcotic dosing if at all possible. Get 3 view knee films for further evaluation.  His updated medication list for this problem includes:    Ibuprofen 800 Mg Tabs (Ibuprofen) .Marland Kitchen... Three times a day    Oxycontin 20 Mg Xr12h-tab (Oxycodone hcl) .Marland Kitchen... 1 tab by mouth two times a day as needed pain    Oxycodone Hcl 5 Mg Caps (Oxycodone hcl) .Marland Kitchen... 1 tab by mouth 4 times a day as  needed breakthrough pain    Tramadol Hcl 50 Mg Tabs (Tramadol hcl) .Marland Kitchen... 2 q6 hours  Complete Medication List: 1)  Daily Multiple Vitamins Tabs (Multiple vitamin) .... One daily 2)  Ibuprofen 800 Mg Tabs (Ibuprofen) .... Three times a day 3)  Allopurinol 300 Mg Tabs (Allopurinol) .... Once daily 4)  Thiamine Hcl 100 Mg Tabs (Thiamine hcl) .... Once daily 5)  Megace Oral 40 Mg/ml Susp (Megestrol acetate) .... 800mg  daily 6)  Oxycontin 20 Mg Xr12h-tab (Oxycodone hcl) .Marland Kitchen.. 1 tab by mouth two times a day as needed pain 7)  Oxycodone Hcl 5 Mg Caps (Oxycodone hcl) .Marland Kitchen.. 1 tab by mouth 4 times  a day as needed breakthrough pain 8)  Metoprolol Tartrate 25 Mg Tabs (Metoprolol tartrate) .... Two times a day 9)  Amlodipine Besylate 5 Mg Tabs (Amlodipine besylate) .... Once daily 10)  Gabapentin 300 Mg Caps (Gabapentin) .... 2 (600mg ) three times a day 11)  Mirtazapine 15 Mg Tabs (Mirtazapine) .... At bedtime 12)  Effexor Xr 75 Mg Xr24h-cap (Venlafaxine hcl) .... Once daily 13)  Tramadol Hcl 50 Mg Tabs (Tramadol hcl) .... 2 q6 hours 14)  Geodon 60 Mg Caps (Ziprasidone hcl) .... Once daily

## 2010-10-06 NOTE — Progress Notes (Signed)
Summary: Rx Prob   Phone Note Call from Patient Call back at Home Phone [phone]   Caller: Patient Summary of Call: Pt at [facility] and is out of his pain meds and wondering what he has to do to get them. Initial call taken by: Clydell Hakim,  June 25, 2010 9:42 AM  Follow-up for Phone Call        to pcp Follow-up by: Golden Circle RN,  June 25, 2010 10:07 AM  Additional Follow-up for Phone Call Additional follow up Details #1::        Thanks, hope this is all cleared up soon.  Additional Follow-up by: Jamie Brookes MD,  June 25, 2010 2:27 PM    spoke with Lurena Joiner at [facility] and she states the pharmacy [facility] is stating they have not received the PA for the oxycodone.. paged Dr. Clotilde Dieter and she states she did send it in yesterday. called number provided by Lurena Joiner to [facility] and spoke with [facility] who handles PA and she states she has received PA form and has faxed it in for the PA . she will contact nursing home when it has been approved. Lurena Joiner @ [facility] notified . Theresia Lo RN  June 25, 2010 10:32 AM   phone  # for [facility] [phone]. Theresia Lo RN  June 25, 2010 10:39 AM

## 2010-10-06 NOTE — Miscellaneous (Signed)
Summary: Med Update - Rx  Medications Added GABAPENTIN 300 MG CAPS (GABAPENTIN) 2 (600mg ) four times a day LEVOTHYROXINE SODIUM 50 MCG TABS (LEVOTHYROXINE SODIUM) 1 at bedtime       Clinical Lists Changes  Medications: Added new medication of LEVOTHYROXINE SODIUM 50 MCG TABS (LEVOTHYROXINE SODIUM) 1 at bedtime - Signed Changed medication from GABAPENTIN 300 MG CAPS (GABAPENTIN) 2 (600mg ) three times a day to GABAPENTIN 300 MG CAPS (GABAPENTIN) 2 (600mg ) four times a day Removed medication of OXYCODONE HCL 5 MG CAPS (OXYCODONE HCL) 1 tab by mouth 4 times a day as needed breakthrough pain Rx of LEVOTHYROXINE SODIUM 50 MCG TABS (LEVOTHYROXINE SODIUM) 1 at bedtime;  #1 x 0;  Signed;  Entered by: Christian Mate D;  Authorized by: Madelon Lips Pharm D;  Method used: Historical    Prescriptions: LEVOTHYROXINE SODIUM 50 MCG TABS (LEVOTHYROXINE SODIUM) 1 at bedtime  #1 x 0   Entered and Authorized by:   Christian Mate D   Signed by:   Madelon Lips Pharm D on 12/18/2009   Method used:   Historical   RxID:   [id number]   Appended Document: Med Update - Rx

## 2010-10-06 NOTE — Assessment & Plan Note (Signed)
Summary: Nursing Home Geriatric Resident note    Primary Care Provider:  Jamie Brookes MD   History of Present Illness: 1) Left Hip/Back pain: Called to bedside by SNF staff to assess patient for complaints of back pain stemming from his history of avascular necrosis of left femoral head. Review of records reveals that his back pain has been extremely difficult to control in the past. He reports a "spasm" that started in his back last night and has gotten progressively worse. He is not a surgical candidate (followuing evaluation at [facility].  Currently on ibuprofen 800 mg TID, tramadol 50 mg 2 q6hrs, Percocet 5/325 mg two times a day for breakthrough pain, Oxycontin 30 mg three times a day, Flexeril 10 mg three times a day. He has just received Flexeril as well as his Oxycontin as per schedule above but refused his 9 AM scheduled pain medications. He was demanding transfer to the hospital for further management of his pain.   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) 9)  Urinary Incontinence, Male  (ICD-788.30) 10)  Bipolar Disorder Unspecified  (ICD-296.80) 11)  Osteoarthritis, Hip, 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) 12)  Hypothyroidism  (ICD-244.9) 13)  Hypertension, Benign Essential  (ICD-401.1) 14)  Back Pain, Chronic  (ICD-724.5) 15)  Degenerative Joint Disease, Cervical Spine  (ICD-721.90) 16)  Spinal Stenosis of Lumbar Region  (ICD-724.02) 17)  Anemia of Chronic Disease  (ICD-285.29) 18)  Hepatitis C, Hx of  (ICD-V12.09) 19)  Alcohol Abuse  (ICD-305.00) 20)  Depression/anxiety  (ICD-300.4) 21)  Insomnia  (ICD-780.52) 22)  Cirrhosis, Alcoholic  (ICD-571.2) 23)   Hypoalbuminemia  (ICD-273.8) 24)  Other Ascites  (ICD-789.59) 25)  Hx of Substance Abuse, Multiple  (ICD-305.90) 26)  Person Living in Residential Institution  (ICD-V60.6)  Current Medications (verified): 1)  Daily Multiple Vitamins  Tabs (Multiple Vitamin) .... One Daily 2)  Ibuprofen 800 Mg Tabs (Ibuprofen) .... Three Times A Day As Needed 3)  Allopurinol 300 Mg Tabs (Allopurinol) .... Once Daily 4)  Metoprolol Tartrate 25 Mg Tabs (Metoprolol Tartrate) .... Two Times A Day 5)  Amlodipine Besylate 5 Mg Tabs (Amlodipine Besylate) .... Once Daily 6)  Gabapentin 300 Mg Caps (Gabapentin) .... 2 (600mg ) Four Times A Day 7)  Effexor Xr 75 Mg Xr24h-Cap (Venlafaxine Hcl) .... Two Daily 8)  Tramadol Hcl 50 Mg Tabs (Tramadol Hcl) .... 2 Q6 Hours 9)  Geodon 60 Mg Caps (Ziprasidone Hcl) .... Two Times A Day 10)  Mylanta 200-200-20 Mg/63ml Susp (Alum & Mag Hydroxide-Simeth) .... 30ml Q4 As Needed For Indigestion 11)  Levothroid 150 Mcg Tabs (Levothyroxine Sodium) .... One Daily 12)  Percocet 5-325 Mg Tabs (Oxycodone-Acetaminophen) .... One Two Times A Day As Needed For Breakthrough Pain 13)  Zantac 150 Mg Tabs (Ranitidine Hcl) .... Take 1 Pill By Mouth Bid 14)  Oxycontin 30 Mg Xr12h-Tab (Oxycodone Hcl) .... Three Times A Day 15)  Cyclobenzaprine Hcl 10 Mg Tabs (Cyclobenzaprine Hcl) .... Three Times A Day As Needed Muscle Spasms  Allergies (verified): No Known Drug Allergies  Physical Exam  General:  Sitting comfortably in bed, immediately cries out in pain when he notices that I am in the room. Overweight.  Lungs:  normal respiratory  effort and normal breath sounds.   Heart:  normal rate, regular rhythm, and no murmur.   Msk:  Left leg does not move much, has pain in the hip, decreased range of motion in the left leg. Crepitus felt with hip movement.  Pain with palpation over left lumbar region.  Pulses:  normal rate, regular  Neurologic:  alert & oriented X3 and sensation intact to light touch.    Psych:  calm when I am watching him from the door, immediately starts crying when I enter the room, mildly agitated    Impression & Recommendations:  Problem # 1:  BACK PAIN, CHRONIC (ICD-724.5) Assessment Deteriorated Continue medications as below. Advised patient that he should not refuse his pain medications. Follow up in AM. Patient with pain-medication seeking behaviors per nursing, prior review of chart. Will give Percocet as below for breakthrough. Advised patient that this medication is for breakthrough pain only and he is to take his scheduled medications as below.   His updated medication list for this problem includes:    Ibuprofen 800 Mg Tabs (Ibuprofen) .Marland Kitchen... Three times a day as needed    Tramadol Hcl 50 Mg Tabs (Tramadol hcl) .Marland Kitchen... 2 q6 hours    Percocet 5-325 Mg Tabs (Oxycodone-acetaminophen) ..... One two times a day as needed for breakthrough pain    Oxycontin 30 Mg Xr12h-tab (Oxycodone hcl) .Marland Kitchen... Three times a day    Cyclobenzaprine Hcl 10 Mg Tabs (Cyclobenzaprine hcl) .Marland Kitchen... Three times a day as needed muscle spasms  Problem # 2:  BIPOLAR DISORDER UNSPECIFIED (ICD-296.80) Assessment: Unchanged Mildly agitated - more likely related to pain-medication seeking behaviors than above, however would  consider increase Geodon if this continues to be an issue.   Complete Medication List: 1)  Daily Multiple Vitamins Tabs (Multiple vitamin) .... One daily 2)  Ibuprofen 800 Mg Tabs (Ibuprofen) .... Three times a day as needed 3)  Allopurinol 300 Mg Tabs (Allopurinol) .... Once daily 4)  Metoprolol Tartrate 25 Mg Tabs (Metoprolol tartrate) .... Two times a day 5)  Amlodipine Besylate 5 Mg Tabs (Amlodipine besylate) .... Once daily 6)  Gabapentin 300 Mg Caps (Gabapentin) .... 2 (600mg ) four times a day 7)  Effexor Xr 75 Mg Xr24h-cap (Venlafaxine hcl) .... Two daily 8)  Tramadol Hcl 50 Mg Tabs (Tramadol hcl) .... 2 q6 hours 9)  Geodon 60 Mg Caps (Ziprasidone hcl) .... Two times a  day 10)  Mylanta 200-200-20 Mg/16ml Susp (Alum & mag hydroxide-simeth) .... 30ml q4 as needed for indigestion 11)  Levothroid 150 Mcg Tabs (Levothyroxine sodium) .... One daily 12)  Percocet 5-325 Mg Tabs (Oxycodone-acetaminophen) .... One two times a day as needed for breakthrough pain 13)  Zantac 150 Mg Tabs (Ranitidine hcl) .... Take 1 pill by mouth bid 14)  Oxycontin 30 Mg Xr12h-tab (Oxycodone hcl) .... Three times a day 15)  Cyclobenzaprine Hcl 10 Mg Tabs (Cyclobenzaprine hcl) .... Three times a day as needed muscle spasms

## 2010-10-06 NOTE — Miscellaneous (Signed)
Summary: Room Number  - Update   Clinical Lists Changes  Observations: Added new observation of PT ROOM#: 325  (01/07/2010 11:22) Removed observation of PT ROOM#: 122  (12/18/2009 16:07)

## 2010-10-06 NOTE — Progress Notes (Signed)
   Phone Note Other Incoming   Caller: [facility] Details for Reason: Behavior issues Summary of Call:  Called by RN pt behavior is out of control, he is yelling obscenities and throwing things. No meds listed for as needed agitation. Has been acting out since 8pm and nursing unable to talk him down. Per RN pt has these episodes which normally do not last long. Given order for Haldol 0.5mg  x 1.  No physical restraints because of SI history  Initial call taken by: Milinda Antis MD,  September 29, 2009 10:50 PM

## 2010-10-06 NOTE — Miscellaneous (Signed)
Summary: Readmission to [facility] from [facility]    Impression & Recommendations:  Problem # 1:  HYPERCALCEMIA (ICD-275.42) Assessment New intact pth was <2.5. .  other work-up included:  HIV Ab neg, undetectable HIV RNA, ANA neg, prealbumin 16.5, psa 0.58.  CT head, CXR, and CT abd/pelvis did not show any acute findings.  His calcium was normal in October.  Has been started on synthroid and was on lithium, which may be playing a role in his hypercalcemia.  Plan:  recheck bmet and iPTH.  check Vit D and TSH.  if still high calcium and low iPTH, check PTH-related peptide  Problem # 2:  BIPOLAR DISORDER UNSPECIFIED (ICD-296.80) Assessment: Improved pt's symptoms much better by self report.  agree with simplified regimen.  continue seroquel as prescribed.  Problem # 3:  OSTEOARTHRITIS, HIP, LEFT (ICD-715.95) Assessment: Unchanged very concerning that 2 drug screens have been negative despite the fact that he was supposedly taking oxycontin three times a day and oxycodone as needed.  I do think he will need something more than ibuprofen for pain; so will add tramadol, but I think we should no longer prescribe narcotics. think we must consider possibility that he was diverting narcotics that he was prescribed previously  The following medications were removed from the medication list:    Oxycontin 20 Mg Xr12h-tab (Oxycodone hcl) .Marland Kitchen... Tid    Oxycodone Hcl 5 Mg Tabs (Oxycodone hcl) .Marland Kitchen... 1 tab by mouth q6 hours as needed for pain    Acetaminophen 650 Mg Tbcr (Acetaminophen) .Marland Kitchen... Three times a day as needed His updated medication list for this problem includes:    Ibuprofen 800 Mg Tabs (Ibuprofen) ..... One two times a day  Problem # 4:  HYPOTHYROIDISM (ICD-244.9) Assessment: Unchanged check tsh His updated medication list for this problem includes:    Levothroid 50 Mcg Tabs (Levothyroxine sodium) .Marland Kitchen... 1 tab by mouth daily for thyroid  Problem # 5:  HYPERTENSION, BENIGN ESSENTIAL  (ICD-401.1) Assessment: Unchanged cont current meds His updated medication list for this problem includes:    Norvasc 10 Mg Tabs (Amlodipine besylate) ..... One daily    Toprol Xl 50 Mg Xr24h-tab (Metoprolol succinate) .Marland Kitchen... 1 tab by mouth daily for blood pressure  Problem # 6:  BACK PAIN, CHRONIC (ICD-724.5) Assessment: Unchanged see discusion under hip pain The following medications were removed from the medication list:    Oxycontin 20 Mg Xr12h-tab (Oxycodone hcl) .Marland Kitchen... Tid    Oxycodone Hcl 5 Mg Tabs (Oxycodone hcl) .Marland Kitchen... 1 tab by mouth q6 hours as needed for pain    Acetaminophen 650 Mg Tbcr (Acetaminophen) .Marland Kitchen... Three times a day as needed His updated medication list for this problem includes:    Ibuprofen 800 Mg Tabs (Ibuprofen) ..... One two times a day  Problem # 7:  CELLULITIS AND ABSCESS OF BUTTOCK (ICD-682.5) Assessment: Improved wounds look much better His updated medication list for this problem includes:    Doxycycline Hyclate 100 Mg Solr (Doxycycline hyclate) .Marland Kitchen... 1 tab by mouth two times a day  Complete Medication List: 1)  Norvasc 10 Mg Tabs (Amlodipine besylate) .... One daily 2)  Toprol Xl 50 Mg Xr24h-tab (Metoprolol succinate) .Marland Kitchen.. 1 tab by mouth daily for blood pressure 3)  Daily Multiple Vitamins Tabs (Multiple vitamin) .... One daily 4)  Ferrous Sulfate 325 (65 Fe) Mg Tabs (Ferrous sulfate) .... Once daily 5)  Ibuprofen 800 Mg Tabs (Ibuprofen) .... One two times a day 6)  Levothroid 50 Mcg Tabs (Levothyroxine sodium) .Marland KitchenMarland KitchenMarland Kitchen  1 tab by mouth daily for thyroid 7)  Seroquel 400 Mg Tabs (Quetiapine fumarate) .Marland Kitchen.. 1 tab by mouth at bedtime 8)  Allopurinol 300 Mg Tabs (Allopurinol) .... Once daily 9)  Thiamine Hcl 100 Mg Tabs (Thiamine hcl) .... Once daily 10)  Megace Oral 40 Mg/ml Susp (Megestrol acetate) .... 800mg  daily 11)  Seroquel 100 Mg Tabs (Quetiapine fumarate) .Marland Kitchen.. 1 tab by mouth at 4pm 12)  Doxycycline Hyclate 100 Mg Solr (Doxycycline hyclate) .Marland Kitchen.. 1 tab by  mouth two times a day   Current Medications (verified): 1)  Norvasc 10 Mg Tabs (Amlodipine Besylate) .... One Daily 2)  Toprol Xl 50 Mg Xr24h-Tab (Metoprolol Succinate) .Marland Kitchen.. 1 Tab By Mouth Daily For Blood Pressure 3)  Daily Multiple Vitamins  Tabs (Multiple Vitamin) .... One Daily 4)  Ferrous Sulfate 325 (65 Fe) Mg Tabs (Ferrous Sulfate) .... Once Daily 5)  Ibuprofen 800 Mg Tabs (Ibuprofen) .... One Two Times A Day 6)  Levothroid 50 Mcg Tabs (Levothyroxine Sodium) .Marland Kitchen.. 1 Tab By Mouth Daily For Thyroid 7)  Seroquel 400 Mg Tabs (Quetiapine Fumarate) .Marland Kitchen.. 1 Tab By Mouth At Bedtime 8)  Allopurinol 300 Mg Tabs (Allopurinol) .... Once Daily 9)  Thiamine Hcl 100 Mg Tabs (Thiamine Hcl) .... Once Daily 10)  Megace Oral 40 Mg/ml Susp (Megestrol Acetate) .... 800mg  Daily 11)  Seroquel 100 Mg Tabs (Quetiapine Fumarate) .Marland Kitchen.. 1 Tab By Mouth At 4pm 12)  Doxycycline Hyclate 100 Mg Solr (Doxycycline Hyclate) .Marland Kitchen.. 1 Tab By Mouth Two Times A Day  Allergies: No Known Drug Allergies   Primary Care Provider:  Jamie Brookes MD  CC:  readmission to [facility] from behavioral health.  History of Present Illness: Pt recently readmitted from [facility] (2/3).  No discharge summary is available (none in chart or up on echart).  1.  psychosis--multiple meds changed.  pt now on seroquel only.  lithium, pristiq d/c'd.    2.  pain--UDS negative on [facility] admission labs despite fact he was supposed to be both long- and short-acting narcotics.    3.  hypercalcemia--as high as 11.4.  triad hospitalists consulted.  work-up outlined in CPOE.   Past History:  Past Medical History: Alcohol abuse Chronic anemia Pancytopenia ? history of cirrhosis HTN psychosis   Physical Exam  General:  relatively well-groomed Lungs:  Normal respiratory effort, chest expands symmetrically. Lungs are clear to auscultation, no crackles or wheezes. Heart:  Normal rate and regular rhythm. S1 and S2 normal without gallop, murmur, click,  rub or other extra sounds. Msk:  left hip wound healed  left knee:  limited rom (about 160 degrees extension and about 40 degrees flexion).  significant crepitus.  pain with movement.  no significant swelling or deformity Psych:  Oriented X3.  gives reasonably good history of his hospital stay.  no evidence of psychosis today Additional Exam:  studies from his hospitalization:  CT abdomen/pelvis  1/29 1.  No evidence of nephrolithiasis, ureterolithiasis, obstructive   uropathy.   2.  Macro nodular appearance of the liver coupled with periportal   adenopathy suggest cirrhosis.  Recommend clinical correlation.   3.  Extensive degenerative osteoarthritis of the left hip with   associated bursitis.   4.  High density material in the gallbladder as described.  CT head 1/26 Premature atrophy.  No focal or acute finding.  CXR 1/26:  no acute findings   Appended Document: Readmission to [facility] from [facility]  I saw Mr Clasby on 10/14/09 regarding his pain and psychiatrict symptoms.  He has significant orthopedic problems which justify treatment of his pain, though we will judge their benefit from his resultant function level and behavior, while monitoring for possible diversion to trade for alcohol, or diversion resulting in him not receiving the prescribed medication.   Clinical Lists Changes  Orders: Added new Test order of Provider Misc Charge- [facility] (Misc) - Signed

## 2010-10-06 NOTE — Miscellaneous (Signed)
Summary: Med Update - Rx  Medications Added GEODON 60 MG CAPS (ZIPRASIDONE HCL) two times a day       Clinical Lists Changes  Medications: Changed medication from GEODON 60 MG CAPS (ZIPRASIDONE HCL) once daily to GEODON 60 MG CAPS (ZIPRASIDONE HCL) two times a day - Signed Rx of GEODON 60 MG CAPS (ZIPRASIDONE HCL) two times a day;  #1 x 0;  Signed;  Entered by: Christian Mate D;  Authorized by: Madelon Lips Pharm D;  Method used: Historical    Prescriptions: GEODON 60 MG CAPS (ZIPRASIDONE HCL) two times a day  #1 x 0   Entered and Authorized by:   Christian Mate D   Signed by:   Madelon Lips Pharm D on 11/26/2009   Method used:   Historical   RxID:   [id number]

## 2010-10-06 NOTE — Miscellaneous (Signed)
Summary: Oxycodone refill   Clinical Lists Changes  Medications: Rx of OXYCODONE HCL 5 MG CAPS (OXYCODONE HCL) 1 tab by mouth 4 times a day as needed breakthrough pain;  #120 x 0;  Signed;  Entered by: Jamie Brookes MD;  Authorized by: Jamie Brookes MD;  Method used: Printed then faxed to    Prescriptions: OXYCODONE HCL 5 MG CAPS (OXYCODONE HCL) 1 tab by mouth 4 times a day as needed breakthrough pain  #120 x 0   Entered and Authorized by:   Jamie Brookes MD   Signed by:   Jamie Brookes MD on 11/18/2009   Method used:   Printed then faxed to ...         RxID:   [id number]

## 2010-10-06 NOTE — Progress Notes (Signed)
Summary: ER visits for narcotics   Phone Note From Other Clinic   Caller: Provider Summary of Call: NP from [facility] ER called me because he has come there q 12 hours for narcotic rx for back pain and they are giving him Dilaudid. I agreed to print rx for what he had been taking to be picked up at our office. He needs to return a controlled drug contract and return for an office visit before further refills.     Prescriptions: OXYCONTIN 30 MG XR12H-TAB (OXYCODONE HCL) three times a day  #90 x 0   Entered by:   Luretha Murphy NP   Authorized by:   Zachery Dauer MD   Signed by:   Luretha Murphy NP on 06/23/2010   Method used:   Print then Give to Patient   RxID:   [id number] PERCOCET 5-325 MG TABS (OXYCODONE-ACETAMINOPHEN) one two times a day as needed for breakthrough pain  #60 x 0   Entered by:   Luretha Murphy NP   Authorized by:   Zachery Dauer MD   Signed by:   Luretha Murphy NP on 06/23/2010   Method used:   Print then Give to Patient   RxID:   [id number] OXYCONTIN 30 MG XR12H-TAB (OXYCODONE HCL) three times a day  #90 x 0   Entered by:   Luretha Murphy NP   Authorized by:   Zachery Dauer MD   Signed by:   Luretha Murphy NP on 06/23/2010   Method used:   Print then Give to Patient   RxID:   [id number] PERCOCET 5-325 MG TABS (OXYCODONE-ACETAMINOPHEN) one two times a day as needed for breakthrough pain  #60 x 0   Entered by:   Luretha Murphy NP   Authorized by:   Zachery Dauer MD   Signed by:   Luretha Murphy NP on 06/23/2010   Method used:   Print then Give to Patient   RxID:   [id number]

## 2010-10-06 NOTE — Miscellaneous (Signed)
   Primary Care Provider:  Jamie Brookes MD   History of Present Illness: Feels that he has been given less pain meds than ordered, would like to be reset to start new count today.  He receives Oxycontin 20 mg q 12 hours, and OxyIR 5 mg q 4, up to 4 per day.  He has terrible pain in his left knee, he is unable to ambulate.  Completed disablity evaluation is waiting on decision.  Has been refusing Seroquel as he reports that it makes him jerk.  He will try another antipsychotic. He is refusing Megace, he does not want to gain any more weight, it makes it hard for him to move around.  Allergies: No Known Drug Allergies  Review of Systems      See HPI  Physical Exam  General:  Alert, pressured speech, oreinted Msk:  Rigid, stiff resistence to ROM of both knees L>R, bent over posture, cannot straighten lumbar spine. Psych:  hypomanic, does not appear delusional or hallucinatory.   Impression & Recommendations:  Problem # 1:  BIPOLAR DISORDER UNSPECIFIED (ICD-296.80)  Trial of Geodon, as Seroquel seems to cause abnormal movements  Orders: Provider Misc Charge- [facility] (Misc)  Problem # 2:  CELLULITIS, ANKLE, RIGHT (ICD-682.6) Improved, discontinue doxycycline  Problem # 3:  HYPERTENSION, BENIGN ESSENTIAL (ICD-401.1) controlled His updated medication list for this problem includes:    Metoprolol Tartrate 25 Mg Tabs (Metoprolol tartrate) .Marland Kitchen..Marland Kitchen Two times a day    Amlodipine Besylate 5 Mg Tabs (Amlodipine besylate) ..... Once daily  Problem # 4:  BACK PAIN, CHRONIC (ICD-724.5)  Will reset the bar for the #4 OxyIR today, so he does not feel that he has been cheated, but continue same dose plan. His updated medication list for this problem includes:    Ibuprofen 800 Mg Tabs (Ibuprofen) .Marland Kitchen... Three times a day    Oxycontin 20 Mg Xr12h-tab (Oxycodone hcl) .Marland Kitchen... 1 tab by mouth two times a day as needed pain    Oxycodone Hcl 5 Mg Caps (Oxycodone hcl) .Marland Kitchen... 1 tab by mouth 4 times a day as  needed breakthrough pain    Tramadol Hcl 50 Mg Tabs (Tramadol hcl) .Marland Kitchen... 2 q6 hours  Orders: Provider Misc Charge- [facility] (Misc)  Complete Medication List: 1)  Daily Multiple Vitamins Tabs (Multiple vitamin) .... One daily 2)  Ibuprofen 800 Mg Tabs (Ibuprofen) .... Three times a day 3)  Allopurinol 300 Mg Tabs (Allopurinol) .... Once daily 4)  Thiamine Hcl 100 Mg Tabs (Thiamine hcl) .... Once daily 5)  Megace Oral 40 Mg/ml Susp (Megestrol acetate) .... 800mg  daily 6)  Oxycontin 20 Mg Xr12h-tab (Oxycodone hcl) .Marland Kitchen.. 1 tab by mouth two times a day as needed pain 7)  Oxycodone Hcl 5 Mg Caps (Oxycodone hcl) .Marland Kitchen.. 1 tab by mouth 4 times a day as needed breakthrough pain 8)  Metoprolol Tartrate 25 Mg Tabs (Metoprolol tartrate) .... Two times a day 9)  Amlodipine Besylate 5 Mg Tabs (Amlodipine besylate) .... Once daily 10)  Gabapentin 300 Mg Caps (Gabapentin) .... 2 (600mg ) three times a day 11)  Mirtazapine 15 Mg Tabs (Mirtazapine) .... At bedtime 12)  Effexor Xr 75 Mg Xr24h-cap (Venlafaxine hcl) .... Once daily 13)  Tramadol Hcl 50 Mg Tabs (Tramadol hcl) .... 2 q6 hours 14)  Geodon 60 Mg Caps (Ziprasidone hcl) .... Once daily

## 2010-10-06 NOTE — Assessment & Plan Note (Signed)
Summary: NH pt visit   Vital Signs:  Patient profile:   55 year old male Weight:      186.2 pounds Temp:     98.6 degrees F Pulse rate:   94 / minute Resp:     20 per minute BP sitting:   112 / 76  Primary Care Provider:  Jamie Brookes MD  CC:  Knee and hip pain and Psychosis.  History of Present Illness: Back and hip pain: Pt c/o of uncontrolled pain in his hip and knee. He has been c/o pain ever since his return from the [facility]. He is very upset and very focused on this pain. Pt was tested for the pain meds that he was suppose to be one while at the [facility] and they were not found to be on the tests. There is question as to whether he is actually taking these meds to now. Also, his EtOH level was found to be 7 on admission to the [facility].   Psychosis: Pt has had multiple admits to the [facility] in the past. He has a h/o EtOH dependence as well as mood disorder. Admitted to the [facility] from 09/13/09 through 09/17/09. He was displaying normal behavior by the end of the stay but they did mention sundowning at night and rapidly progressing dementia (most likely related to alcohol dependence although demential of alzheimer's type could not be ruled out). Pt was discharged back to the SNF on Seroquel. He not refuses to take this medication and is showing symptoms of psychosis again.   Weight Loss:  Pt has had weight loss over the last 2 years per patient. Pt has been seen by Nutrition and is now on Med Pass and [employer] to try to increase his weight.   Habits & Providers  Comments: Pt has prior h/o EtOH dependence  Current Medications (verified): 1)  Toprol Xl 50 Mg Xr24h-Tab (Metoprolol Succinate) .Marland Kitchen.. 1 Tab By Mouth Daily For Blood Pressure 2)  Daily Multiple Vitamins  Tabs (Multiple Vitamin) .... One Daily 3)  Ferrous Sulfate 325 (65 Fe) Mg Tabs (Ferrous Sulfate) .... Once Daily 4)  Ibuprofen 800 Mg Tabs (Ibuprofen) .... Tid 5)  Allopurinol 300 Mg Tabs (Allopurinol) .... Once Daily 6)  Thiamine Hcl 100  Mg Tabs (Thiamine Hcl) .... Once Daily 7)  Megace Oral 40 Mg/ml Susp (Megestrol Acetate) .... 800mg  Daily 8)  Doxycycline Hyclate 100 Mg Solr (Doxycycline Hyclate) .Marland Kitchen.. 1 Tab By Mouth Two Times A Day 9)  Oxycontin 20 Mg Xr12h-Tab (Oxycodone Hcl) .Marland Kitchen.. 1 Tab By Mouth Two Times A Day As Needed Pain 10)  Oxycodone Hcl 5 Mg Caps (Oxycodone Hcl) .Marland Kitchen.. 1 Tab By Mouth 4 Times A Day As Needed Breakthrough Pain  Allergies (verified): No Known Drug Allergies  Review of Systems        vitals reviewed and pertinent negatives and positives seen in HPI   Physical Exam  General:  sitting in wheelchair, NAD Head:  Normocephalic and atraumatic without obvious abnormalities. No apparent alopecia or balding. Lungs:  Normal respiratory effort, chest expands symmetrically. Lungs are clear to auscultation, no crackles or wheezes. Heart:  Normal rate and regular rhythm. S1 and S2 normal without gallop, murmur, click, rub or other extra sounds. Abdomen:  Bowel sounds positive,abdomen soft and non-tender without masses, organomegaly or hernias noted. Extremities:  Lt LE skin tear, Lt knee skin tear, buttock wound healed, dry dressings over Lt LE tears.  Psych:  Pt was argumentative, anxious, and very agitated. He was  yelling down the hallway.    Impression & Recommendations:  Problem # 1:  OSTEOARTHRITIS, HIP, LEFT (ICD-715.95) Assessment Deteriorated Pt has significant pain from his OA on hip and knee. His Ibuprofen was not doing the job well. After discussing with Dr. Sheffield Slider I agree with restarting Oxycontin and oxycodone. Will ask staff to closely monitor him taking his pain meds.   His updated medication list for this problem includes:    Ibuprofen 800 Mg Tabs (Ibuprofen) .Marland Kitchen... Tid    Oxycontin 20 Mg Xr12h-tab (Oxycodone hcl) .Marland Kitchen... 1 tab by mouth two times a day as needed pain    Oxycodone Hcl 5 Mg Caps (Oxycodone hcl) .Marland Kitchen... 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 This could be because he was in pain during my visit, but it was noted by the staff at the [facility] (who know him well) that he is becoming more demented. Pt refuses Seroquel. We will try Mirtazepine.   Problem # 3:  WEIGHT LOSS (ICD-783.21) Pt says he has lost a substantial amount of weight. Hehas recently been seen by Nutrition and is now taking in Med Pass and [employer]. Will monitor weight.   Complete Medication List: 1)  Toprol Xl 50 Mg Xr24h-tab (Metoprolol succinate) .Marland Kitchen.. 1 tab by mouth daily for blood pressure 2)  Daily Multiple Vitamins Tabs (Multiple vitamin) .... One daily 3)  Ferrous Sulfate 325 (65 Fe) Mg Tabs (Ferrous sulfate) .... Once daily 4)  Ibuprofen 800 Mg Tabs (Ibuprofen) .... Tid 5)  Allopurinol 300 Mg Tabs (Allopurinol) .... Once daily 6)  Thiamine Hcl 100 Mg Tabs (Thiamine hcl) .... Once daily 7)  Megace Oral 40 Mg/ml Susp (Megestrol acetate) .... 800mg  daily 8)  Doxycycline Hyclate 100 Mg Solr (Doxycycline hyclate) .Marland Kitchen.. 1 tab by mouth two times a day 9)  Oxycontin 20 Mg Xr12h-tab (Oxycodone hcl) .Marland Kitchen.. 1 tab by mouth two times a day as needed pain 10)  Oxycodone Hcl 5 Mg Caps (Oxycodone hcl) .Marland Kitchen.. 1 tab by mouth 4 times a day as needed breakthrough pain  Appended Document: NH pt visit I also saw Mr Schappell on Feb 7th and discussed the plan with the [facility] and Dr Clotilde Dieter and Dr Lafonda Mosses. Zachery Dauer MD  October 20, 2009 11:28 AM     Clinical Lists Changes  Orders: Added new Test order of Provider Misc Charge- [facility] (Misc) - Signed

## 2010-10-08 NOTE — Miscellaneous (Signed)
Summary: order  received results of D-Dimer 2.60 Patient to go to the ER asap for evaluation of possible blood clot per Dr.Strother spoke with Toni Amend and faxed the order and lab results to her attention: [phone] Arlyss Repress CMA,  August 20, 2010 5:42 PM  Pt had dopplers in the ED and they were found to be negative. Thought to be cellulitis. Jamie Brookes MD  August 21, 2010 8:50 AM

## 2010-10-08 NOTE — Progress Notes (Signed)
Summary: Refilled Metoprolol, Oramorph, Percocet.   Phone Note Refill Request Call back at [phone]   Refills Requested: Medication #1:  METOPROLOL TARTRATE 25 MG TABS two times a day  Medication #2:  METOPROLOL TARTRATE 25 MG TABS two times a day  Medication #3:  ORAMORPH SR 30 MG XR12H-TAB take 1 pill every 6 hours for a total of 120 mg daily.    #124  Medication #4:  PERCOCET 7.5-325 MG TABS take 1 pill twice daily for breakthrough pain. Pt will have someone pick up rxs for med refills. Was told by Nursing  Home facility to call and get refills.  Ask for [facility] when rxs are ready.  Initial call taken by: Abundio Miu,  September 14, 2010 11:24 AM  Follow-up for Phone Call        Called the [facility]. Thayer Ohm will come pick up the Rx's. He has picked them up before. let her know they are in an envelope at the front desk.  Follow-up by: Jamie Brookes MD,  September 15, 2010 9:30 AM    Prescriptions: METOPROLOL TARTRATE 25 MG TABS (METOPROLOL TARTRATE) two times a day  #62 x 11   Entered and Authorized by:   Jamie Brookes MD   Signed by:   Jamie Brookes MD on 09/15/2010   Method used:   Handwritten   RxID:   [id number] PERCOCET 7.5-325 MG TABS (OXYCODONE-ACETAMINOPHEN) take 1 pill twice daily for breakthrough pain.  #62 x 0   Entered and Authorized by:   Jamie Brookes MD   Signed by:   Jamie Brookes MD on 09/15/2010   Method used:   Handwritten   RxID:   [id number] ORAMORPH SR 30 MG XR12H-TAB (MORPHINE SULFATE) take 1 pill every 6 hours for a total of 120 mg daily.    #124  #124 x 0   Entered and Authorized by:   Jamie Brookes MD   Signed by:   Jamie Brookes MD on 09/15/2010   Method used:   Handwritten   RxID:   [id number]

## 2010-10-08 NOTE — Assessment & Plan Note (Signed)
Summary: left lower leg edema, pain   Vital Signs:  Patient profile:   55 year old male Height:      72 inches Weight:      237 pounds Temp:     98.6 degrees F oral Pulse rate:   71 / minute BP sitting:   118 / 77  (right arm) Cuff size:   regular  Vitals Entered By: Jimmy Footman, CMA(August 20, 2010 3:33 PM) CC: left knee swelling x1 week Is Patient Diabetic? Yes Pain Assessment Patient in pain? yes     Location: knee Intensity: 10 Type: sharp   Primary Care [place] Eppard:  Jamie Brookes MD  CC:  left knee swelling x1 week.  History of Present Illness: Left knee swelling: Pt is having some lower extremity swelling and erythema and pain. It has been red for about 1 week. It is warm compared to the other leg. He has several open sores on his knees, he picks these sores because he says they itch. he says that the facility is not putting the bandages on his wounds like they use to.   Hip pain: Pt is having his normal hip pain and is here for a refill on his meds.   Current Medications (verified): 1)  Daily Multiple Vitamins  Tabs (Multiple Vitamin) .... One Daily 2)  Ibuprofen 800 Mg Tabs (Ibuprofen) .... Three Times A Day As Needed 3)  Allopurinol 300 Mg Tabs (Allopurinol) .... Once Daily 4)  Metoprolol Tartrate 25 Mg Tabs (Metoprolol Tartrate) .... Two Times A Day 5)  Amlodipine Besylate 5 Mg Tabs (Amlodipine Besylate) .... Once Daily 6)  Gabapentin 300 Mg Caps (Gabapentin) .... 2 (600mg ) Four Times A Day 7)  Effexor Xr 150 Mg Xr24h-Cap (Venlafaxine Hcl) .Marland Kitchen.. 1 By Mouth Qday 8)  Tramadol Hcl 50 Mg Tabs (Tramadol Hcl) .... 2 Q6 Hours 9)  Percocet 7.5-325 Mg Tabs (Oxycodone-Acetaminophen) .... Take 1 Pill Twice Daily For Breakthrough Pain. 10)  Zantac 150 Mg Tabs (Ranitidine Hcl) .... Take 1 Pill By Mouth Bid 11)  Oramorph Sr 30 Mg Xr12h-Tab (Morphine Sulfate) .... Take 1 Pill Every 6 Hours For A Total of 120 Mg Daily.    #124 12)  Cyclobenzaprine Hcl 10 Mg Tabs  (Cyclobenzaprine Hcl) .... Three Times A Day As Needed Muscle Spasms 13)  Geodon 80 Mg Caps (Ziprasidone Hcl) .... Two Times A Day 14)  Synthroid 137 Mcg Tabs (Levothyroxine Sodium) .... Take 1 Pill Daily For Your Thyroid 15)  Tamsulosin Hcl 0.4 Mg Caps (Tamsulosin Hcl) .Marland Kitchen.. 1 Cap Daily At Bedtime 16)  Ferrous Sulfate 325 (65 Fe) Mg Tabs (Ferrous Sulfate) .... Take 1 Pill Daily 17)  Doxycycline Hyclate 100 Mg Caps (Doxycycline Hyclate)  Allergies (verified): No Known Drug Allergies  Review of Systems        vitals reviewed and pertinent negatives and positives seen in HPI   Physical Exam  General:  Well-developed,well-nourished,in no acute distress; alert,appropriate and cooperative throughout examination Extremities:  left lower leg edema and erythema up to the knee. several scabbed over wounds on the lateral side of the leg at the level of the knee   Impression & Recommendations:  Problem # 1:  LEG EDEMA (ICD-782.3) Assessment New left lower leg edema, concern for lower extremity DVT vs cellulitis. Will plan to start Doxycycline if the d-dimer is normal. If it is elevated he will go be evaluated in the ED. If the dopplers in the ED are negative he will still be treated with Doxycycline.  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 His left hip is almost fused to his femoral head. He is not a surgical candidate. Pt is left on pain meds to control the pain in his joints. Pain meds refilled today.   Orders: [facility]- Est Level  3 (57846)  Complete Medication List: 1)  Daily Multiple Vitamins Tabs (Multiple vitamin) .... One daily 2)  Ibuprofen 800 Mg Tabs (Ibuprofen) .... Three times a day as needed 3)  Allopurinol 300 Mg Tabs (Allopurinol) .... Once daily 4)   Metoprolol Tartrate 25 Mg Tabs (Metoprolol tartrate) .... Two times a day 5)  Amlodipine Besylate 5 Mg Tabs (Amlodipine besylate) .... Once daily 6)  Gabapentin 300 Mg Caps (Gabapentin) .... 2 (600mg ) four times a day 7)  Effexor Xr 150 Mg Xr24h-cap (Venlafaxine hcl) .Marland Kitchen.. 1 by mouth qday 8)  Tramadol Hcl 50 Mg Tabs (Tramadol hcl) .... 2 q6 hours 9)  Percocet 7.5-325 Mg Tabs (Oxycodone-acetaminophen) .... Take 1 pill twice daily for breakthrough pain. 10)  Zantac 150 Mg Tabs (Ranitidine hcl) .... Take 1 pill by mouth bid 11)  Oramorph Sr 30 Mg Xr12h-tab (Morphine sulfate) .... Take 1 pill every 6 hours for a total of 120 mg daily.    #124 12)  Cyclobenzaprine Hcl 10 Mg Tabs (Cyclobenzaprine hcl) .... Three times a day as needed muscle spasms 13)  Geodon 80 Mg Caps (Ziprasidone hcl) .... Two times a day 14)  Synthroid 137 Mcg Tabs (Levothyroxine sodium) .... Take 1 pill daily for your thyroid 15)  Tamsulosin Hcl 0.4 Mg Caps (Tamsulosin hcl) .Marland Kitchen.. 1 cap daily at bedtime 16)  Ferrous Sulfate 325 (65 Fe) Mg Tabs (Ferrous sulfate) .... Take 1 pill daily 17)  Doxycycline Hyclate 100 Mg Caps (Doxycycline hyclate) Prescriptions: CYCLOBENZAPRINE HCL 10 MG TABS (CYCLOBENZAPRINE HCL) three times a day as needed muscle spasms  #60 x 3   Entered and Authorized by:   Jamie Brookes MD   Signed by:   Jamie Brookes MD on 08/24/2010   Method used:   Handwritten   RxID:   [id number] PERCOCET 7.5-325 MG TABS (OXYCODONE-ACETAMINOPHEN) take 1 pill twice daily for breakthrough pain.  #62 x 0   Entered and Authorized by:   Jamie Brookes MD   Signed by:   Jamie Brookes MD on 08/24/2010   Method used:   Handwritten   RxID:   [id number]    Orders Added: 1)  CBC w/Diff-[facility] [85025] 2)  Culture, Blood- [facility] [[id number]] 3)  D-Dimer- [facility] [[id number]] 4)  [facility]- Est Level  3 [32951]

## 2010-10-14 NOTE — Assessment & Plan Note (Signed)
Summary: knee pain,df   Vital Signs:  Patient profile:   55 year old male Height:      72 inches Weight:      234 pounds Temp:     98.4 degrees F oral Pulse rate:   69 / minute Pulse rhythm:   regular BP sitting:   114 / 76  (left arm) Cuff size:   regular  Vitals Entered By: Loralee Pacas CMA(October 05, 2010 2:43 PM) CC: left knee pain   Primary Care Provider:  Jamie Brookes MD  CC:  left knee pain.  History of Present Illness: Pain not controlled: Pt is here to talk about his pain. He is having new left knee pain. It it no being controlled on his current regimine nor is his daily pain. He has been on the Oramorph for the last few months and was doing well until recently. He says he is up and down all [employer] to the bathroom about 15 times a day because he drinks lot of coffee. He feels like the pain in his left knee has gotten worse in the last 5 days. He also complains of some left wrist pain. He has applied repeatedly for a motorized wheelchair and has been denied so he is getting up and pulling himself with his upper body a lot. He says it is hard to find the nurses to give him his meds. He is very frustrated and wants to go back to his Oxycontin meds because he felt like they lasted longer to help relieve his pain.     I got a call from the assisted living where he resides. They say he is getting his meds as prescribed. He asks for his meds all at the same time, he asks for his meds as soon as he can get them.  Habits & Providers  Alcohol-Tobacco-Diet     Tobacco Status: current     Tobacco Counseling: to quit use of tobacco products  Current Medications (verified): 1)  Daily Multiple Vitamins  Tabs (Multiple Vitamin) .... One Daily 2)  Ibuprofen 800 Mg Tabs (Ibuprofen) .... Three Times A Day As Needed 3)  Allopurinol 300 Mg Tabs (Allopurinol) .... Once Daily 4)  Metoprolol Tartrate 25 Mg Tabs (Metoprolol Tartrate) .... Two Times A Day 5)  Amlodipine Besylate 5 Mg  Tabs (Amlodipine Besylate) .... Once Daily 6)  Gabapentin 300 Mg Caps (Gabapentin) .... 2 (600mg ) Four Times A Day 7)  Effexor Xr 150 Mg Xr24h-Cap (Venlafaxine Hcl) .Marland Kitchen.. 1 By Mouth Qday 8)  Tramadol Hcl 50 Mg Tabs (Tramadol Hcl) .... 2 Q6 Hours 9)  Percocet 7.5-325 Mg Tabs (Oxycodone-Acetaminophen) .... Take 1 Pill Twice Daily For Breakthrough Pain. 10)  Zantac 150 Mg Tabs (Ranitidine Hcl) .... Take 1 Pill By Mouth Bid 11)  Oxycontin 30 Mg Xr12h-Tab (Oxycodone Hcl) .Marland Kitchen.. 1 Tab By Mouth Three Times A Day For Pain 12)  Cyclobenzaprine Hcl 10 Mg Tabs (Cyclobenzaprine Hcl) .... Three Times A Day As Needed Muscle Spasms 13)  Geodon 80 Mg Caps (Ziprasidone Hcl) .... Two Times A Day 14)  Synthroid 137 Mcg Tabs (Levothyroxine Sodium) .... Take 1 Pill Daily For Your Thyroid 15)  Tamsulosin Hcl 0.4 Mg Caps (Tamsulosin Hcl) .Marland Kitchen.. 1 Cap Daily At Bedtime 16)  Ferrous Sulfate 325 (65 Fe) Mg Tabs (Ferrous Sulfate) .... Take 1 Pill Daily 17)  Doxycycline Hyclate 100 Mg Caps (Doxycycline Hyclate) 18)  Percocet 7.5-325 Mg Tabs (Oxycodone-Acetaminophen) .... Take 1 By Mouth Two Times A Day For Breakthrough Pain.  Do Not Fill Until 11-05-10 19)  Percocet 7.5-325 Mg Tabs (Oxycodone-Acetaminophen) .... Take 1 By Mouth Two Times A Day For Breakthrough Pain. Do Not Fill Until 12-06-10  Allergies (verified): No Known Drug Allergies  Social History: Homeless, now a resident at [facility] assisted living facility Alcoholic-drink free since institutionalized Tobacco abuse-changed from smoking to chewing, but will go back to smoking if he can't find chew. Smoking Status:  current  Review of Systems        vitals reviewed and pertinent negatives and positives seen in HPI   Physical Exam  General:  Well-developed,well-nourished,in no acute distress; alert,appropriate and cooperative throughout examination Msk:  left knee tenderness, joints are very stiff.  Psych:  pt goes between agitated and normally  interactive   Impression & Recommendations:  Problem # 1:  OSTEOARTHRITIS, KNEE, LEFT (ICD-715.96) Assessment Deteriorated  Pt is having increased left knee pain. He wants to change back to his Oxycontin which lasted longer for him. I agree that we have given Morphine a good try but it is not currently working for him. Will switch back to Oxycontin.   His updated medication list for this problem includes:    Ibuprofen 800 Mg Tabs (Ibuprofen) .Marland Kitchen... Three times a day as needed    Tramadol Hcl 50 Mg Tabs (Tramadol hcl) .Marland Kitchen... 2 q6 hours    Percocet 7.5-325 Mg Tabs (Oxycodone-acetaminophen) .Marland Kitchen... Take 1 pill twice daily for breakthrough pain.    Oxycontin 30 Mg Xr12h-tab (Oxycodone hcl) .Marland Kitchen... 1 tab by mouth three times a day for pain    Cyclobenzaprine Hcl 10 Mg Tabs (Cyclobenzaprine hcl) .Marland Kitchen... Three times a day as needed muscle spasms    Percocet 7.5-325 Mg Tabs (Oxycodone-acetaminophen) .Marland Kitchen... Take 1 by mouth two times a day for breakthrough pain. do not fill until 11-05-10    Percocet 7.5-325 Mg Tabs (Oxycodone-acetaminophen) .Marland Kitchen... Take 1 by mouth two times a day for breakthrough pain. do not fill until 12-06-10  Orders: [facility]- Est Level  3 (16109)  Complete Medication List: 1)  Daily Multiple Vitamins Tabs (Multiple vitamin) .... One daily 2)  Ibuprofen 800 Mg Tabs (Ibuprofen) .... Three times a day as needed 3)  Allopurinol 300 Mg Tabs (Allopurinol) .... Once daily 4)  Metoprolol Tartrate 25 Mg Tabs (Metoprolol tartrate) .... Two times a day 5)  Amlodipine Besylate 5 Mg Tabs (Amlodipine besylate) .... Once daily 6)  Gabapentin 300 Mg Caps (Gabapentin) .... 2 (600mg ) four times a day 7)  Effexor Xr 150 Mg Xr24h-cap (Venlafaxine hcl) .Marland Kitchen.. 1 by mouth qday 8)  Tramadol Hcl 50 Mg Tabs (Tramadol hcl) .... 2 q6 hours 9)  Percocet 7.5-325 Mg Tabs (Oxycodone-acetaminophen) .... Take 1 pill twice daily for breakthrough pain. 10)  Zantac 150 Mg Tabs (Ranitidine hcl) .... Take 1 pill by mouth bid 11)   Oxycontin 30 Mg Xr12h-tab (Oxycodone hcl) .Marland Kitchen.. 1 tab by mouth three times a day for pain 12)  Cyclobenzaprine Hcl 10 Mg Tabs (Cyclobenzaprine hcl) .... Three times a day as needed muscle spasms 13)  Geodon 80 Mg Caps (Ziprasidone hcl) .... Two times a day 14)  Synthroid 137 Mcg Tabs (Levothyroxine sodium) .... Take 1 pill daily for your thyroid 15)  Tamsulosin Hcl 0.4 Mg Caps (Tamsulosin hcl) .Marland Kitchen.. 1 cap daily at bedtime 16)  Ferrous Sulfate 325 (65 Fe) Mg Tabs (Ferrous sulfate) .... Take 1 pill daily 17)  Doxycycline Hyclate 100 Mg Caps (Doxycycline hyclate) 18)  Percocet 7.5-325 Mg Tabs (Oxycodone-acetaminophen) .... Take 1 by mouth  two times a day for breakthrough pain. do not fill until 11-05-10 19)  Percocet 7.5-325 Mg Tabs (Oxycodone-acetaminophen) .... Take 1 by mouth two times a day for breakthrough pain. do not fill until 12-06-10  Other Orders: TSH-[facility] ([id number]) Prescriptions: PERCOCET 7.5-325 MG TABS (OXYCODONE-ACETAMINOPHEN) take 1 by mouth two times a day for breakthrough pain. Do not fill until 12-06-10  #60 x 0   Entered and Authorized by:   Jamie Brookes MD   Signed by:   Jamie Brookes MD on 10/06/2010   Method used:   Printed then faxed to ...         RxID:   [id number] PERCOCET 7.5-325 MG TABS (OXYCODONE-ACETAMINOPHEN) take 1 by mouth two times a day for breakthrough pain. Do not fill until 11-05-10  #60 x 0   Entered and Authorized by:   Jamie Brookes MD   Signed by:   Jamie Brookes MD on 10/06/2010   Method used:   Printed then faxed to ...         RxID:   [id number] PERCOCET 7.5-325 MG TABS (OXYCODONE-ACETAMINOPHEN) take 1 pill twice daily for breakthrough pain.  #60 x 0   Entered and Authorized by:   Jamie Brookes MD   Signed by:   Jamie Brookes MD on 10/06/2010   Method used:   Printed then faxed to ...         RxID:   [id number] OXYCONTIN 30 MG XR12H-TAB (OXYCODONE HCL) 1 tab by mouth three times a day for pain  #90 x 0   Entered and  Authorized by:   Jamie Brookes MD   Signed by:   Jamie Brookes MD on 10/06/2010   Method used:   Handwritten   RxID:   [id number]    Orders Added: 1)  [facility] [[id number]] 2)  [facility]- Est Level  3 [63016]

## 2010-10-14 NOTE — Progress Notes (Addendum)
Summary: Phn Msg   Phone Note Call from Patient Call back at Home Phone [phone]   Summary of Call: pt asking to have his prescription times changed to 6, 2 and 10, pt is awake at 5 am  Initial call taken by: Knox Royalty,  October 06, 2010 12:06 PM  Follow-up for Phone Call        He is going to be on Oxycontin and it is suppose to be dosed 3 times a day so the proposed times are fine with me. Please let him know. Thanks.  Follow-up by: Jamie Brookes MD,  October 07, 2010 9:03 AM  Additional Follow-up for Phone Call Additional follow up Details #1::        [name] is calling back to say the purpose distributing his meds have changed the times from 6,2 & 10 to 8,2 and 8.  He won't be having his meds after the last dosage for 12 hours.  He is up at 5:00 every morning.  Which will make him having to wait until 8: to have his next dosage.  Please fax order to have meds dispensed for the original time frame.  Additional Follow-up by: Abundio Miu,  October 07, 2010 10:02 AM    Additional Follow-up for Phone Call Additional follow up Details #2::    I called [facility] and spoke to the nurse who took a verbal order to change his med times to 6:00am, 2:00pm, and 10:00 pm for his Oxycontin. She took the verbal order.  Follow-up by: Jamie Brookes MD,  October 08, 2010 9:24 AM

## 2010-11-03 ENCOUNTER — Other Ambulatory Visit: Payer: Self-pay | Admitting: Family Medicine

## 2010-11-03 MED ORDER — OXYCODONE HCL 30 MG PO TB12: 30.0000 mg | ORAL_TABLET | Freq: Three times a day (TID) | ORAL | Status: DC

## 2010-11-10 ENCOUNTER — Ambulatory Visit (INDEPENDENT_AMBULATORY_CARE_PROVIDER_SITE_OTHER): Payer: Medicaid Other | Admitting: Family Medicine

## 2010-11-10 VITALS — BP 187/106 | HR 79 | Temp 98.5°F

## 2010-11-10 DIAGNOSIS — M87059 Idiopathic aseptic necrosis of unspecified femur: Secondary | ICD-10-CM

## 2010-11-10 DIAGNOSIS — F10950 Alcohol use, unspecified with alcohol-induced psychotic disorder with delusions: Secondary | ICD-10-CM

## 2010-11-15 NOTE — Assessment & Plan Note (Signed)
I believe this patient needs a motorized wheel chair based on his description of the [facility] facility and his difficulty getting around. He is basically wheelchair bound and has a lot of pain with transfers. He also has a lot of upper shoulder pain because of the way he has to pull himself up steep ramps using his arms on the railings.

## 2010-11-15 NOTE — Progress Notes (Signed)
Pt comes in today to discuss why he has not gotten his motorized wheelchair.  I have filled out paperwork for the patient to get this wheel chair but he has been repeatedly denied per patient.  Based on the paperwork that he brings in it appears that they believe the patient would not be safe to operate the wheelchair because he is on some strong psych meds.  He would like to be reevaluated and taken off his psych meds if possible.  He has access to a psych group called [facility]. They have a psychiatrist that is there once a week and a psychologist there once a week as well. I talked to the [facility] personal on the phone and asked for an evaluation for this patient. They said that they will try to evaluate him before the end of the month (his next hearing is the end of March to decide if he can get this wheelchair).  I believe the patient would benefit from a motorized wheelchair and that he is stable on his current meds. He may be able to come off of some of them if approved by psychology.   ROS: neg except as noted in HPI, neg fevers, chills or weight loss  PE:  Gen: NAD, sitting in wheel chair Psych: pt is frustrated about current situation but thinking clearing and communicating effectively.

## 2010-11-16 ENCOUNTER — Telehealth: Payer: Self-pay | Admitting: Family Medicine

## 2010-11-16 LAB — CBC
HCT: 34.8 % — ABNORMAL LOW (ref 39.0–52.0)
HCT: 34.9 % — ABNORMAL LOW (ref 39.0–52.0)
Hemoglobin: 11.6 g/dL — ABNORMAL LOW (ref 13.0–17.0)
MCH: 25.4 pg — ABNORMAL LOW (ref 26.0–34.0)
MCH: 26.9 pg (ref 26.0–34.0)
MCV: 80.8 fL (ref 78.0–100.0)
MCV: 81.1 fL (ref 78.0–100.0)
Platelets: 206 10*3/uL (ref 150–400)
Platelets: 216 10*3/uL (ref 150–400)
RBC: 4.29 MIL/uL (ref 4.22–5.81)
RBC: 4.32 MIL/uL (ref 4.22–5.81)
RDW: 15.3 % (ref 11.5–15.5)
WBC: 8.7 10*3/uL (ref 4.0–10.5)
WBC: 8.8 10*3/uL (ref 4.0–10.5)

## 2010-11-16 LAB — BASIC METABOLIC PANEL
BUN: 16 mg/dL (ref 6–23)
CO2: 26 mEq/L (ref 19–32)
CO2: 26 mEq/L (ref 19–32)
Chloride: 101 mEq/L (ref 96–112)
Chloride: 103 mEq/L (ref 96–112)
Creatinine, Ser: 1.03 mg/dL (ref 0.4–1.5)
GFR calc Af Amer: >60 mL/min (ref >60)
Potassium: 3.7 mEq/L (ref 3.5–5.1)
Potassium: 4.7 mEq/L (ref 3.5–5.1)
Sodium: 138 mEq/L (ref 135–145)

## 2010-11-16 LAB — DIFFERENTIAL
Eosinophils Absolute: 0 10*3/uL (ref 0.0–0.7)
Eosinophils Relative: 0 % (ref 0–5)
Lymphocytes Relative: 27 % (ref 12–46)
Lymphs Abs: 2.3 10*3/uL (ref 0.7–4.0)
Monocytes Absolute: 0.7 10*3/uL (ref 0.1–1.0)

## 2010-11-16 LAB — APTT: aPTT: 34 seconds (ref 24–37)

## 2010-11-16 NOTE — Telephone Encounter (Signed)
Mr. Sakuma is requesting a call back regarding his medicine list.  There are some meds that are still showing up on the list that he no longer take.  Would like to discuss this with you.  Have an appt with Medicaid mediation hearing to determine my need for electric wheelchair.

## 2010-11-17 ENCOUNTER — Telehealth: Payer: Self-pay | Admitting: *Deleted

## 2010-11-17 NOTE — Telephone Encounter (Signed)
[name] with Dr. Okey Regal Richardson's office(Psychologist) is calling to speak with Dr Clotilde Dieter.  States Dr. Clotilde Dieter referred Jeffrey [name] to their service and wants to get more information about the referral.  They need to know the purpose of the referral, what the patients needs are and if Dr. Clotilde Dieter has progress notes or documentation to support this referral.  States patient came in today of initial intake but it was not completed.  Asking for a return call at office [phone] or cell [phone].  Jeffrey [name]'

## 2010-11-18 ENCOUNTER — Other Ambulatory Visit: Payer: Self-pay | Admitting: Family Medicine

## 2010-11-18 NOTE — Progress Notes (Signed)
Addended by: Jamie Brookes on: 11/18/2010 02:12 PM   Modules accepted: Orders

## 2010-11-18 NOTE — Telephone Encounter (Signed)
I called his assisted living facility and got the official list of meds that he is taking per one of the caretakers. She read it off to me while I was on the phone. The only med we had that he was not on was the Doxycycline and Tramadol. I had her double check that he was actually getting all the meds she listed and she is sure he is getting them.  If he has a different list please have him send it in the fax or mail.  I have also left a message with the people who are suppose to do the psych eval so that they know what he needs.  Please let him know.

## 2010-11-19 ENCOUNTER — Telehealth: Payer: Self-pay | Admitting: *Deleted

## 2010-11-19 LAB — CBC
HCT: 30 % — ABNORMAL LOW (ref 39.0–52.0)
HCT: 30.7 % — ABNORMAL LOW (ref 39.0–52.0)
HCT: 31 % — ABNORMAL LOW (ref 39.0–52.0)
HCT: 31.2 % — ABNORMAL LOW (ref 39.0–52.0)
HCT: 31.6 % — ABNORMAL LOW (ref 39.0–52.0)
HCT: 30 % — ABNORMAL LOW (ref 39.0–52.0)
HCT: 34.9 % — ABNORMAL LOW (ref 39.0–52.0)
Hemoglobin: 10.5 g/dL — ABNORMAL LOW (ref 13.0–17.0)
Hemoglobin: 10.6 g/dL — ABNORMAL LOW (ref 13.0–17.0)
Hemoglobin: 10.8 g/dL — ABNORMAL LOW (ref 13.0–17.0)
Hemoglobin: 10.8 g/dL — ABNORMAL LOW (ref 13.0–17.0)
Hemoglobin: 10.9 g/dL — ABNORMAL LOW (ref 13.0–17.0)
Hemoglobin: 10.3 g/dL — ABNORMAL LOW (ref 13.0–17.0)
Hemoglobin: 11.8 g/dL — ABNORMAL LOW (ref 13.0–17.0)
Hemoglobin: 13.1 g/dL (ref 13.0–17.0)
MCH: 26.7 pg (ref 26.0–34.0)
MCH: 26.7 pg (ref 26.0–34.0)
MCH: 27.2 pg (ref 26.0–34.0)
MCHC: 33.7 g/dL (ref 30.0–36.0)
MCHC: 34.8 g/dL (ref 30.0–36.0)
MCHC: 35 g/dL (ref 30.0–36.0)
MCHC: 33.8 g/dL (ref 30.0–36.0)
MCHC: 34.3 g/dL (ref 30.0–36.0)
MCV: 76.9 fL — ABNORMAL LOW (ref 78.0–100.0)
MCV: 77.1 fL — ABNORMAL LOW (ref 78.0–100.0)
MCV: 77.3 fL — ABNORMAL LOW (ref 78.0–100.0)
MCV: 80.1 fL (ref 78.0–100.0)
Platelets: 106 10*3/uL — ABNORMAL LOW (ref 150–400)
Platelets: 240 10*3/uL (ref 150–400)
Platelets: 70 10*3/uL — ABNORMAL LOW (ref 150–400)
Platelets: 116 10*3/uL — ABNORMAL LOW (ref 150–400)
Platelets: 64 10*3/uL — ABNORMAL LOW (ref 150–400)
RBC: 3.98 MIL/uL — ABNORMAL LOW (ref 4.22–5.81)
RBC: 4.02 MIL/uL — ABNORMAL LOW (ref 4.22–5.81)
RBC: 4.09 MIL/uL — ABNORMAL LOW (ref 4.22–5.81)
RBC: 3.86 MIL/uL — ABNORMAL LOW (ref 4.22–5.81)
RBC: 4.82 MIL/uL (ref 4.22–5.81)
RDW: 15.7 % — ABNORMAL HIGH (ref 11.5–15.5)
RDW: 15.8 % — ABNORMAL HIGH (ref 11.5–15.5)
RDW: 16.1 % — ABNORMAL HIGH (ref 11.5–15.5)
RDW: 16.7 % — ABNORMAL HIGH (ref 11.5–15.5)
RDW: 15.4 % (ref 11.5–15.5)
WBC: 10.9 10*3/uL — ABNORMAL HIGH (ref 4.0–10.5)
WBC: 11.4 10*3/uL — ABNORMAL HIGH (ref 4.0–10.5)
WBC: 11.7 10*3/uL — ABNORMAL HIGH (ref 4.0–10.5)
WBC: 9.7 10*3/uL (ref 4.0–10.5)
WBC: 15 10*3/uL — ABNORMAL HIGH (ref 4.0–10.5)

## 2010-11-19 LAB — TSH: TSH: 5.488 u[IU]/mL — ABNORMAL HIGH (ref 0.350–4.500)

## 2010-11-19 LAB — URINALYSIS, ROUTINE W REFLEX MICROSCOPIC
Glucose, UA: NEGATIVE mg/dL
Ketones, ur: 15 mg/dL — AB
Protein, ur: >300 mg/dL — AB
pH: 6 (ref 5.0–8.0)

## 2010-11-19 LAB — BASIC METABOLIC PANEL
BUN: 22 mg/dL (ref 6–23)
BUN: 58 mg/dL — ABNORMAL HIGH (ref 6–23)
BUN: 61 mg/dL — ABNORMAL HIGH (ref 6–23)
CO2: 20 mEq/L (ref 19–32)
CO2: 18 mEq/L — ABNORMAL LOW (ref 19–32)
Calcium: 8.1 mg/dL — ABNORMAL LOW (ref 8.4–10.5)
Calcium: 8.2 mg/dL — ABNORMAL LOW (ref 8.4–10.5)
Calcium: 8.6 mg/dL (ref 8.4–10.5)
Chloride: 104 mEq/L (ref 96–112)
Chloride: 105 mEq/L (ref 96–112)
Chloride: 105 mEq/L (ref 96–112)
Creatinine, Ser: 1.86 mg/dL — ABNORMAL HIGH (ref 0.4–1.5)
Creatinine, Ser: 3.5 mg/dL — ABNORMAL HIGH (ref 0.4–1.5)
Creatinine, Ser: 3.74 mg/dL — ABNORMAL HIGH (ref 0.4–1.5)
Creatinine, Ser: 4.38 mg/dL — ABNORMAL HIGH (ref 0.4–1.5)
GFR calc Af Amer: 21 mL/min — ABNORMAL LOW (ref >60)
GFR calc Af Amer: 47 mL/min — ABNORMAL LOW (ref >60)
GFR calc Af Amer: 58 mL/min — ABNORMAL LOW (ref >60)
GFR calc non Af Amer: 18 mL/min — ABNORMAL LOW (ref >60)
GFR calc non Af Amer: 19 mL/min — ABNORMAL LOW (ref >60)
GFR calc non Af Amer: 48 mL/min — ABNORMAL LOW (ref >60)
GFR calc non Af Amer: 14 mL/min — ABNORMAL LOW (ref >60)
GFR calc non Af Amer: 15 mL/min — ABNORMAL LOW (ref >60)
Glucose, Bld: 119 mg/dL — ABNORMAL HIGH (ref 70–99)
Glucose, Bld: 113 mg/dL — ABNORMAL HIGH (ref 70–99)
Potassium: 3.3 mEq/L — ABNORMAL LOW (ref 3.5–5.1)
Potassium: 3.4 mEq/L — ABNORMAL LOW (ref 3.5–5.1)
Potassium: 3.6 mEq/L (ref 3.5–5.1)
Potassium: 3.7 mEq/L (ref 3.5–5.1)
Potassium: 3.9 mEq/L (ref 3.5–5.1)
Sodium: 135 mEq/L (ref 135–145)
Sodium: 135 mEq/L (ref 135–145)
Sodium: 135 mEq/L (ref 135–145)
Sodium: 141 mEq/L (ref 135–145)
Sodium: 132 mEq/L — ABNORMAL LOW (ref 135–145)

## 2010-11-19 LAB — GLUCOSE, CAPILLARY
Glucose-Capillary: 101 mg/dL — ABNORMAL HIGH (ref 70–99)
Glucose-Capillary: 103 mg/dL — ABNORMAL HIGH (ref 70–99)
Glucose-Capillary: 119 mg/dL — ABNORMAL HIGH (ref 70–99)
Glucose-Capillary: 128 mg/dL — ABNORMAL HIGH (ref 70–99)
Glucose-Capillary: 83 mg/dL (ref 70–99)
Glucose-Capillary: 85 mg/dL (ref 70–99)
Glucose-Capillary: 88 mg/dL (ref 70–99)
Glucose-Capillary: 90 mg/dL (ref 70–99)
Glucose-Capillary: 91 mg/dL (ref 70–99)
Glucose-Capillary: 92 mg/dL (ref 70–99)
Glucose-Capillary: 94 mg/dL (ref 70–99)
Glucose-Capillary: 95 mg/dL (ref 70–99)
Glucose-Capillary: 96 mg/dL (ref 70–99)
Glucose-Capillary: 98 mg/dL (ref 70–99)
Glucose-Capillary: 98 mg/dL (ref 70–99)
Glucose-Capillary: 100 mg/dL — ABNORMAL HIGH (ref 70–99)
Glucose-Capillary: 106 mg/dL — ABNORMAL HIGH (ref 70–99)
Glucose-Capillary: 110 mg/dL — ABNORMAL HIGH (ref 70–99)
Glucose-Capillary: 113 mg/dL — ABNORMAL HIGH (ref 70–99)
Glucose-Capillary: 115 mg/dL — ABNORMAL HIGH (ref 70–99)
Glucose-Capillary: 116 mg/dL — ABNORMAL HIGH (ref 70–99)
Glucose-Capillary: 121 mg/dL — ABNORMAL HIGH (ref 70–99)

## 2010-11-19 LAB — LACTIC ACID, PLASMA: Lactic Acid, Venous: 2.3 mmol/L — ABNORMAL HIGH (ref 0.5–2.2)

## 2010-11-19 LAB — RAPID URINE DRUG SCREEN, HOSP PERFORMED
Amphetamines: NOT DETECTED
Benzodiazepines: NOT DETECTED
Cocaine: NOT DETECTED
Opiates: POSITIVE — AB
Tetrahydrocannabinol: NOT DETECTED

## 2010-11-19 LAB — CROSSMATCH
Antibody Screen: NEGATIVE
Donor AG Type: NEGATIVE

## 2010-11-19 LAB — CULTURE, BLOOD (ROUTINE X 2)
Culture  Setup Time: 201109130202
Culture  Setup Time: 201109130202
Culture: NO GROWTH
Culture: NO GROWTH

## 2010-11-19 LAB — RENAL FUNCTION PANEL
Albumin: 2.3 g/dL — ABNORMAL LOW (ref 3.5–5.2)
Albumin: 2.5 g/dL — ABNORMAL LOW (ref 3.5–5.2)
CO2: 20 mEq/L (ref 19–32)
Chloride: 111 mEq/L (ref 96–112)
Chloride: 98 mEq/L (ref 96–112)
GFR calc Af Amer: 33 mL/min — ABNORMAL LOW (ref >60)
GFR calc Af Amer: 18 mL/min — ABNORMAL LOW (ref >60)
GFR calc non Af Amer: 27 mL/min — ABNORMAL LOW (ref >60)
GFR calc non Af Amer: 15 mL/min — ABNORMAL LOW (ref >60)
Potassium: 3.4 mEq/L — ABNORMAL LOW (ref 3.5–5.1)
Potassium: 4.2 mEq/L (ref 3.5–5.1)

## 2010-11-19 LAB — BRAIN NATRIURETIC PEPTIDE
Pro B Natriuretic peptide (BNP): 1759 pg/mL — ABNORMAL HIGH (ref 0.0–100.0)
Pro B Natriuretic peptide (BNP): 706 pg/mL — ABNORMAL HIGH (ref 0.0–100.0)

## 2010-11-19 LAB — DIFFERENTIAL
Basophils Absolute: 0 10*3/uL (ref 0.0–0.1)
Basophils Relative: 0 % (ref 0–1)
Eosinophils Absolute: 0 10*3/uL (ref 0.0–0.7)
Eosinophils Relative: 0 % (ref 0–5)
Lymphs Abs: 1.1 10*3/uL (ref 0.7–4.0)
Monocytes Absolute: 1.4 10*3/uL — ABNORMAL HIGH (ref 0.1–1.0)
Neutro Abs: 13.3 10*3/uL — ABNORMAL HIGH (ref 1.7–7.7)
Neutrophils Relative %: 88 % — ABNORMAL HIGH (ref 43–77)

## 2010-11-19 LAB — COMPREHENSIVE METABOLIC PANEL
ALT: 28 U/L (ref 0–53)
AST: 37 U/L (ref 0–37)
Albumin: 3 g/dL — ABNORMAL LOW (ref 3.5–5.2)
Alkaline Phosphatase: 314 U/L — ABNORMAL HIGH (ref 39–117)
Alkaline Phosphatase: 134 U/L — ABNORMAL HIGH (ref 39–117)
BUN: 55 mg/dL — ABNORMAL HIGH (ref 6–23)
BUN: 46 mg/dL — ABNORMAL HIGH (ref 6–23)
CO2: 21 mEq/L (ref 19–32)
CO2: 21 mEq/L (ref 19–32)
Calcium: 8 mg/dL — ABNORMAL LOW (ref 8.4–10.5)
Chloride: 94 mEq/L — ABNORMAL LOW (ref 96–112)
GFR calc Af Amer: 24 mL/min — ABNORMAL LOW (ref >60)
GFR calc non Af Amer: 21 mL/min — ABNORMAL LOW (ref >60)
GFR calc non Af Amer: 20 mL/min — ABNORMAL LOW (ref >60)
Glucose, Bld: 95 mg/dL (ref 70–99)
Potassium: 4.6 mEq/L (ref 3.5–5.1)
Sodium: 141 mEq/L (ref 135–145)
Total Bilirubin: 0.5 mg/dL (ref 0.3–1.2)
Total Protein: 6 g/dL (ref 6.0–8.3)

## 2010-11-19 LAB — URINE MICROSCOPIC-ADD ON

## 2010-11-19 LAB — CREATININE, URINE, RANDOM: Creatinine, Urine: 72.5 mg/dL

## 2010-11-19 LAB — AMMONIA: Ammonia: 40 umol/L — ABNORMAL HIGH (ref 11–35)

## 2010-11-19 LAB — PROCALCITONIN: Procalcitonin: 73.31 ng/mL

## 2010-11-19 LAB — URINE CULTURE: Culture  Setup Time: 201109121842

## 2010-11-19 LAB — PSA: PSA: 1.2 ng/mL (ref 0.10–4.00)

## 2010-11-19 NOTE — Telephone Encounter (Signed)
I am trying to get Darla Lesches reevaluated by a psychiatrist. I have contacted the group that the assisted living place he is at uses. ([facility]) They say he is not a candidate and they think he already has a psychiatrist. Any idea which psychiatrist evaluated him in the nursing home at [facility]? If you know maybe I could contact that person and send in a referral for him to be evaluated.  Thanks.

## 2010-11-19 NOTE — Telephone Encounter (Signed)
Received call from [name] , psychologist stating that she will not be able to provide services to patient  because he does not meet their criteria. She states she thinks patient has a psychiatrist.  If MD has questions she can call  [phone].

## 2010-11-19 NOTE — Telephone Encounter (Signed)
Spoke with caretaker @ [facility] and confirmed the medication list has been updated and taken care of per Dr. Clotilde Dieter

## 2010-11-22 LAB — MAGNESIUM: Magnesium: 2 mg/dL (ref 1.5–2.5)

## 2010-11-22 LAB — COMPREHENSIVE METABOLIC PANEL
ALT: 13 U/L (ref 0–53)
ALT: 13 U/L (ref 0–53)
ALT: 15 U/L (ref 0–53)
AST: 25 U/L (ref 0–37)
AST: 25 U/L (ref 0–37)
Alkaline Phosphatase: 104 U/L (ref 39–117)
CO2: 26 mEq/L (ref 19–32)
Calcium: 10.4 mg/dL (ref 8.4–10.5)
Calcium: 11.2 mg/dL — ABNORMAL HIGH (ref 8.4–10.5)
Chloride: 107 mEq/L (ref 96–112)
GFR calc Af Amer: >60 mL/min (ref >60)
GFR calc Af Amer: >60 mL/min (ref >60)
GFR calc Af Amer: >60 mL/min (ref >60)
GFR calc non Af Amer: >60 mL/min (ref >60)
Glucose, Bld: 83 mg/dL (ref 70–99)
Glucose, Bld: 101 mg/dL — ABNORMAL HIGH (ref 70–99)
Potassium: 3.4 mEq/L — ABNORMAL LOW (ref 3.5–5.1)
Sodium: 138 mEq/L (ref 135–145)
Sodium: 139 mEq/L (ref 135–145)
Sodium: 139 mEq/L (ref 135–145)
Total Bilirubin: 0.4 mg/dL (ref 0.3–1.2)
Total Protein: 7.1 g/dL (ref 6.0–8.3)
Total Protein: 7.4 g/dL (ref 6.0–8.3)

## 2010-11-22 LAB — BASIC METABOLIC PANEL
CO2: 21 mEq/L (ref 19–32)
Calcium: 11.4 mg/dL — ABNORMAL HIGH (ref 8.4–10.5)
Chloride: 114 mEq/L — ABNORMAL HIGH (ref 96–112)
Creatinine, Ser: 0.99 mg/dL (ref 0.4–1.5)
Creatinine, Ser: 0.73 mg/dL (ref 0.4–1.5)
GFR calc Af Amer: >60 mL/min (ref >60)
GFR calc Af Amer: >60 mL/min (ref >60)
GFR calc non Af Amer: >60 mL/min (ref >60)
Potassium: 4.2 mEq/L (ref 3.5–5.1)
Sodium: 140 mEq/L (ref 135–145)

## 2010-11-22 LAB — DIFFERENTIAL
Basophils Absolute: 0 10*3/uL (ref 0.0–0.1)
Basophils Relative: 1 % (ref 0–1)
Basophils Relative: 0 % (ref 0–1)
Eosinophils Absolute: 0.2 10*3/uL (ref 0.0–0.7)
Eosinophils Absolute: 0.2 10*3/uL (ref 0.0–0.7)
Eosinophils Relative: 3 % (ref 0–5)
Eosinophils Relative: 3 % (ref 0–5)
Lymphs Abs: 2.5 10*3/uL (ref 0.7–4.0)
Lymphs Abs: 2.6 10*3/uL (ref 0.7–4.0)
Monocytes Absolute: 0.7 10*3/uL (ref 0.1–1.0)
Monocytes Relative: 8 % (ref 3–12)
Monocytes Relative: 8 % (ref 3–12)
Neutro Abs: 5.5 10*3/uL (ref 1.7–7.7)
Neutrophils Relative %: 61 % (ref 43–77)

## 2010-11-22 LAB — RAPID URINE DRUG SCREEN, HOSP PERFORMED
Amphetamines: NOT DETECTED
Benzodiazepines: NOT DETECTED
Cocaine: NOT DETECTED
Tetrahydrocannabinol: NOT DETECTED
Tetrahydrocannabinol: NOT DETECTED

## 2010-11-22 LAB — PTH, INTACT AND CALCIUM: Calcium, Total (PTH): 10.8 mg/dL — ABNORMAL HIGH (ref 8.4–10.5)

## 2010-11-22 LAB — CBC
Hemoglobin: 11 g/dL — ABNORMAL LOW (ref 13.0–17.0)
Hemoglobin: 10.7 g/dL — ABNORMAL LOW (ref 13.0–17.0)
MCHC: 33.2 g/dL (ref 30.0–36.0)
MCV: 81.2 fL (ref 78.0–100.0)
RBC: 4.15 MIL/uL — ABNORMAL LOW (ref 4.22–5.81)
RBC: 3.88 MIL/uL — ABNORMAL LOW (ref 4.22–5.81)
RBC: 3.93 MIL/uL — ABNORMAL LOW (ref 4.22–5.81)
RDW: 17.1 % — ABNORMAL HIGH (ref 11.5–15.5)
WBC: 6.8 10*3/uL (ref 4.0–10.5)
WBC: 9 10*3/uL (ref 4.0–10.5)

## 2010-11-22 LAB — PSA: PSA: 0.58 ng/mL (ref 0.10–4.00)

## 2010-11-22 LAB — HCV RNA QUANT

## 2010-11-22 LAB — URINALYSIS, ROUTINE W REFLEX MICROSCOPIC
Glucose, UA: NEGATIVE mg/dL
Ketones, ur: NEGATIVE mg/dL
Leukocytes, UA: NEGATIVE
Leukocytes, UA: NEGATIVE
Nitrite: NEGATIVE
Nitrite: NEGATIVE
Protein, ur: 30 mg/dL — AB
Specific Gravity, Urine: 1.016 (ref 1.005–1.030)
Urobilinogen, UA: 0.2 mg/dL (ref 0.0–1.0)
Urobilinogen, UA: 0.2 mg/dL (ref 0.0–1.0)
pH: 6.5 (ref 5.0–8.0)

## 2010-11-22 LAB — URINE MICROSCOPIC-ADD ON

## 2010-11-22 LAB — ETHANOL
Alcohol, Ethyl (B): <5 mg/dL (ref 0–10)
Alcohol, Ethyl (B): 7 mg/dL (ref 0–10)

## 2010-11-22 LAB — AMMONIA: Ammonia: 17 umol/L (ref 11–35)

## 2010-11-22 LAB — HIV ANTIBODY (ROUTINE TESTING W REFLEX): HIV: NONREACTIVE

## 2010-11-22 LAB — PREALBUMIN: Prealbumin: 16.5 mg/dL — ABNORMAL LOW (ref 18.0–45.0)

## 2010-11-22 LAB — LIPASE, BLOOD: Lipase: 123 U/L — ABNORMAL HIGH (ref 11–59)

## 2010-11-22 LAB — LITHIUM LEVEL: Lithium Lvl: 0.98 mEq/L (ref 0.80–1.40)

## 2010-11-22 LAB — T4, FREE: Free T4: 1.21 ng/dL (ref 0.80–1.80)

## 2010-11-23 NOTE — Telephone Encounter (Signed)
He is well known to [facility] psychiatrists, we found that out with previous admissions.  Review E chart for details.  At this point he would need to present himself to [facility] services unless he was a danger to himself or others then he could be admitted.  There are no easy answers for patients like Jeffrey [name], their life is hell and it makes everyone connected with them feel helpless as well.  He would need to call the [facility] himself and make the appointment.  It is an overtaxed system as you know.

## 2010-11-25 LAB — BASIC METABOLIC PANEL
Chloride: 112 mEq/L (ref 96–112)
GFR calc non Af Amer: >60 mL/min (ref >60)
Glucose, Bld: 109 mg/dL — ABNORMAL HIGH (ref 70–99)
Potassium: 4.3 mEq/L (ref 3.5–5.1)
Sodium: 138 mEq/L (ref 135–145)

## 2010-11-25 LAB — VITAMIN D 1,25 DIHYDROXY: Vitamin D3 1, 25 (OH)2: <8 pg/mL

## 2010-11-25 NOTE — Telephone Encounter (Signed)
Please call and leave a message for Jeffrey [name] that since he was seen by the [facility] psychiatrists he needs to be reevaluated by them to determine if some of his meds can be discontinued. He needs to call the [facility] (please give him the phone number) and make an appointment with them to determine if he can stop some of his psych meds. ( He is trying to do this so he can qualify for a motorized wheel chair which I agree that he should have.)  Thanks, [facility]

## 2010-11-25 NOTE — Telephone Encounter (Signed)
(  [phone]. [facility] [employer] with rebecca at [facility] and told her that Jeffrey [name] will need to call the [facility] to make an appt to be reevaluated concerning his meds and to qualify for power wheelchair. She stated that she will pass this information along to the pt.Marland KitchenMarland KitchenLoralee Pacas Hendricks

## 2010-11-27 ENCOUNTER — Ambulatory Visit (INDEPENDENT_AMBULATORY_CARE_PROVIDER_SITE_OTHER): Payer: Medicaid Other | Admitting: Family Medicine

## 2010-11-27 VITALS — BP 127/80 | HR 68

## 2010-11-27 DIAGNOSIS — M25519 Pain in unspecified shoulder: Secondary | ICD-10-CM

## 2010-11-27 DIAGNOSIS — F319 Bipolar disorder, unspecified: Secondary | ICD-10-CM

## 2010-11-27 DIAGNOSIS — M25511 Pain in right shoulder: Secondary | ICD-10-CM

## 2010-11-27 NOTE — Progress Notes (Signed)
Shoulder pain: Pt has some bilateral shoulder pain. He describes having to pull himself in his wheelchair up ramps with his arms on the railings at his assisted living facility. He takes some strong pain meds and is not asking for pain medication but reiiterates how much easier (with less pain) it would be for him to get around with a motorized wheel chair. He has a hearing on 12-03-10 to decide if he can get one. Apparently one of the questions if whether he still drinks and is on some of his meds.  He has taken himself off Geodon. He has not drank in the 2 years he has been in skilled nursing and assisted living facilities per patient and I have never seen any indication to suggest he has been drinking.   ROS; neg except shoulder pain and of course hip pain.   PE:  Gen: sitting in wheel chair.  Psych: PT is in a pleasant mood, he is making sense, no hallucinations or delusions. Awake and alert.

## 2010-12-02 ENCOUNTER — Telehealth: Payer: Self-pay | Admitting: Family Medicine

## 2010-12-02 DIAGNOSIS — M25512 Pain in left shoulder: Secondary | ICD-10-CM | POA: Insufficient documentation

## 2010-12-02 NOTE — Assessment & Plan Note (Signed)
Pt was put on Geodon and other meds by a psychiatrist about 2 years ago and has been on them for awhile. I have tried to get a psychiatrist to come out to see him again but have not had much success. The people from [facility] say that he has one so they can't see him. He was evaluated by an inpatient team apparently and they don't see people in the community. He says he is working with someone at the assisted living facility who he will get to write a letter about taking him off these meds. He is currently doing well off Geodon but I would still like an official recommendation from a psychiatrist or psychologist before formally taking him off his psych meds.

## 2010-12-02 NOTE — Assessment & Plan Note (Signed)
Pt has bilateral shoulder pain and I believe this is made worse by his described pulling himself up the stairway ramps with arms outstretched between railings. I think this patient would benefit from a motorized wheel chair.  He is no longer drinking, he is of sound mind and judgment while in my office. He is going to try again to get this scooter.

## 2010-12-02 NOTE — Telephone Encounter (Signed)
He is in [facility] and called to say he is stopping all his meds.  He states that they do not seem to know what he is taking and why.  He is stopping all meds until he can figure out what to take.

## 2010-12-03 NOTE — Telephone Encounter (Signed)
I tried to call him back, sat on the phone on hold for about 10 minutes. Never got him.  I do not advise he stop all his meds. I can explain what they are for if he is interested in coming in for a visit about that.  I hope his hearing went well today.

## 2010-12-06 ENCOUNTER — Emergency Department (HOSPITAL_COMMUNITY)
Admission: EM | Admit: 2010-12-06 | Discharge: 2010-12-06 | Disposition: A | Discharge status: Home / Self Care | Payer: Medicaid Other | Attending: Emergency Medicine | Admitting: Emergency Medicine

## 2010-12-06 DIAGNOSIS — M545 Low back pain, unspecified: Secondary | ICD-10-CM | POA: Insufficient documentation

## 2010-12-06 DIAGNOSIS — K7460 Unspecified cirrhosis of liver: Secondary | ICD-10-CM | POA: Insufficient documentation

## 2010-12-06 DIAGNOSIS — Z79899 Other long term (current) drug therapy: Secondary | ICD-10-CM | POA: Insufficient documentation

## 2010-12-06 DIAGNOSIS — B1920 Unspecified viral hepatitis C without hepatic coma: Secondary | ICD-10-CM | POA: Insufficient documentation

## 2010-12-06 DIAGNOSIS — I10 Essential (primary) hypertension: Secondary | ICD-10-CM | POA: Insufficient documentation

## 2010-12-06 DIAGNOSIS — E039 Hypothyroidism, unspecified: Secondary | ICD-10-CM | POA: Insufficient documentation

## 2010-12-06 DIAGNOSIS — G8929 Other chronic pain: Secondary | ICD-10-CM | POA: Insufficient documentation

## 2010-12-06 DIAGNOSIS — F319 Bipolar disorder, unspecified: Secondary | ICD-10-CM | POA: Insufficient documentation

## 2010-12-06 DIAGNOSIS — F341 Dysthymic disorder: Secondary | ICD-10-CM | POA: Insufficient documentation

## 2010-12-07 LAB — DIFFERENTIAL
Basophils Relative: 1 % (ref 0–1)
Eosinophils Absolute: 0.2 10*3/uL (ref 0.0–0.7)
Eosinophils Relative: 3 % (ref 0–5)
Neutrophils Relative %: 60 % (ref 43–77)

## 2010-12-07 LAB — CBC
HCT: 32.7 % — ABNORMAL LOW (ref 39.0–52.0)
MCHC: 33.2 g/dL (ref 30.0–36.0)
MCV: 80.7 fL (ref 78.0–100.0)
Platelets: 186 10*3/uL (ref 150–400)
RDW: 15.6 % — ABNORMAL HIGH (ref 11.5–15.5)

## 2010-12-07 LAB — BASIC METABOLIC PANEL
BUN: 20 mg/dL (ref 6–23)
CO2: 26 mEq/L (ref 19–32)
Chloride: 104 mEq/L (ref 96–112)
Creatinine, Ser: 0.78 mg/dL (ref 0.4–1.5)
Glucose, Bld: 99 mg/dL (ref 70–99)
Potassium: 3.6 mEq/L (ref 3.5–5.1)

## 2010-12-07 LAB — URINALYSIS, ROUTINE W REFLEX MICROSCOPIC
Bilirubin Urine: NEGATIVE
Ketones, ur: NEGATIVE mg/dL
Nitrite: NEGATIVE
Urobilinogen, UA: 0.2 mg/dL (ref 0.0–1.0)
pH: 7 (ref 5.0–8.0)

## 2010-12-07 LAB — URINE CULTURE: Colony Count: >=100000

## 2010-12-07 LAB — URINE MICROSCOPIC-ADD ON

## 2010-12-08 ENCOUNTER — Telehealth: Payer: Self-pay | Admitting: Family Medicine

## 2010-12-08 ENCOUNTER — Other Ambulatory Visit: Payer: Self-pay | Admitting: Family Medicine

## 2010-12-08 DIAGNOSIS — E039 Hypothyroidism, unspecified: Secondary | ICD-10-CM

## 2010-12-08 MED ORDER — OXYCODONE HCL 30 MG PO TB12: 30.0000 mg | ORAL_TABLET | Freq: Three times a day (TID) | ORAL | Status: DC

## 2010-12-08 NOTE — Telephone Encounter (Signed)
Attempted to call patient, phone was busy.

## 2010-12-08 NOTE — Assessment & Plan Note (Signed)
I received a letter from [facility] saying that they had record of this patient having a TSH of 11.386.  We checked his TSH 2 months ago and it was 4.797 which is closer to normal than he has been in a long time.  I suspect lab error but will repeat to rule it out.

## 2010-12-08 NOTE — Telephone Encounter (Signed)
Please contact this patient and ask him to come in to have a TSH drawn. I have a letter from a pharmacist saying that his most recent TSH indicates he is very low on Thyroid but out last lab work here was close to normal. We need to retest his TSH.  I have already put in the future order and he can come any day to get it done, but make an appointment for lab work.  Thanks.

## 2010-12-09 NOTE — Telephone Encounter (Signed)
Attempted to call patient again...Marland KitchenMarland KitchenMarland Kitchen Phone is busy

## 2010-12-09 NOTE — Telephone Encounter (Signed)
Spoke with [employer] and told her that Gilbert will need to come in for TSH to be drawn and to call back to make the appt for this lab visit only.Laureen Ochs, Viann Shove

## 2010-12-10 ENCOUNTER — Telehealth: Payer: Self-pay | Admitting: *Deleted

## 2010-12-10 LAB — POCT I-STAT 4, (NA,K, GLUC, HGB,HCT)
Glucose, Bld: 114 mg/dL — ABNORMAL HIGH (ref 70–99)
Hemoglobin: 8.5 g/dL — ABNORMAL LOW (ref 13.0–17.0)
Potassium: 4 mEq/L (ref 3.5–5.1)
Sodium: 137 mEq/L (ref 135–145)

## 2010-12-10 LAB — CBC
HCT: 26.1 % — ABNORMAL LOW (ref 39.0–52.0)
Hemoglobin: 8.4 g/dL — ABNORMAL LOW (ref 13.0–17.0)
Hemoglobin: 8.6 g/dL — ABNORMAL LOW (ref 13.0–17.0)
MCHC: 33.1 g/dL (ref 30.0–36.0)
MCHC: 33.2 g/dL (ref 30.0–36.0)
MCHC: 33.8 g/dL (ref 30.0–36.0)
MCV: 85 fL (ref 78.0–100.0)
MCV: 85.2 fL (ref 78.0–100.0)
MCV: 85.6 fL (ref 78.0–100.0)
Platelets: 207 10*3/uL (ref 150–400)
Platelets: 262 10*3/uL (ref 150–400)
RBC: 3 MIL/uL — ABNORMAL LOW (ref 4.22–5.81)
RBC: 3.03 MIL/uL — ABNORMAL LOW (ref 4.22–5.81)
RBC: 3.07 MIL/uL — ABNORMAL LOW (ref 4.22–5.81)
RBC: 3.39 MIL/uL — ABNORMAL LOW (ref 4.22–5.81)
RDW: 15.1 % (ref 11.5–15.5)
WBC: 7.1 10*3/uL (ref 4.0–10.5)
WBC: 7.7 10*3/uL (ref 4.0–10.5)

## 2010-12-10 LAB — CROSSMATCH
ABO/RH(D): B NEG
Donor AG Type: NEGATIVE

## 2010-12-10 LAB — BASIC METABOLIC PANEL
BUN: 16 mg/dL (ref 6–23)
CO2: 25 mEq/L (ref 19–32)
CO2: 25 mEq/L (ref 19–32)
CO2: 26 mEq/L (ref 19–32)
Calcium: 8.5 mg/dL (ref 8.4–10.5)
Calcium: 8.9 mg/dL (ref 8.4–10.5)
Calcium: 8.9 mg/dL (ref 8.4–10.5)
Chloride: 101 mEq/L (ref 96–112)
Creatinine, Ser: 0.62 mg/dL (ref 0.4–1.5)
Creatinine, Ser: 0.67 mg/dL (ref 0.4–1.5)
GFR calc Af Amer: >60 mL/min (ref >60)
GFR calc Af Amer: >60 mL/min (ref >60)
GFR calc Af Amer: >60 mL/min (ref >60)
GFR calc non Af Amer: >60 mL/min (ref >60)
Glucose, Bld: 92 mg/dL (ref 70–99)
Potassium: 3.7 mEq/L (ref 3.5–5.1)
Sodium: 134 mEq/L — ABNORMAL LOW (ref 135–145)
Sodium: 134 mEq/L — ABNORMAL LOW (ref 135–145)

## 2010-12-10 LAB — COMPREHENSIVE METABOLIC PANEL
ALT: 13 U/L (ref 0–53)
AST: 24 U/L (ref 0–37)
Albumin: 2.8 g/dL — ABNORMAL LOW (ref 3.5–5.2)
CO2: 25 mEq/L (ref 19–32)
Calcium: 10.1 mg/dL (ref 8.4–10.5)
Chloride: 106 mEq/L (ref 96–112)
Creatinine, Ser: 0.61 mg/dL (ref 0.4–1.5)
GFR calc Af Amer: >60 mL/min (ref >60)
GFR calc non Af Amer: >60 mL/min (ref >60)
Sodium: 139 mEq/L (ref 135–145)
Total Bilirubin: 0.3 mg/dL (ref 0.3–1.2)

## 2010-12-10 LAB — APTT: aPTT: 33 seconds (ref 24–37)

## 2010-12-10 MED ORDER — OXYCODONE HCL 30 MG PO TB12: 30.0000 mg | ORAL_TABLET | Freq: Three times a day (TID) | ORAL | Status: DC

## 2010-12-10 NOTE — Telephone Encounter (Signed)
Patient called pharmacy line from [facility], asking about rx for oxycontin for which he said a refill request had been faxed in. Looks like Dr. Clotilde Dieter refilled this medication on 12/08/10 but I am unable to find it anywhere. Will forward to preceptor since PCP is on vacation.

## 2010-12-10 NOTE — Telephone Encounter (Signed)
I wrote another Rx.   If one written by Dr Clotilde Dieter surfaces it should be destroyed and documented

## 2010-12-11 LAB — BASIC METABOLIC PANEL
BUN: 15 mg/dL (ref 6–23)
CO2: 24 mEq/L (ref 19–32)
Calcium: 9.6 mg/dL (ref 8.4–10.5)
GFR calc non Af Amer: >60 mL/min (ref >60)
Glucose, Bld: 106 mg/dL — ABNORMAL HIGH (ref 70–99)
Sodium: 136 mEq/L (ref 135–145)

## 2010-12-11 LAB — HEPATIC FUNCTION PANEL
Albumin: 2.6 g/dL — ABNORMAL LOW (ref 3.5–5.2)
Bilirubin, Direct: <0.1 mg/dL (ref 0.0–0.3)
Total Bilirubin: 0.6 mg/dL (ref 0.3–1.2)

## 2010-12-11 LAB — WOUND CULTURE
Culture: NO GROWTH
Gram Stain: NONE SEEN

## 2010-12-11 LAB — APTT: aPTT: 34 seconds (ref 24–37)

## 2010-12-11 LAB — CBC
Hemoglobin: 10.7 g/dL — ABNORMAL LOW (ref 13.0–17.0)
MCHC: 32.8 g/dL (ref 30.0–36.0)
Platelets: 275 10*3/uL (ref 150–400)
RDW: 18.7 % — ABNORMAL HIGH (ref 11.5–15.5)

## 2010-12-11 LAB — PROTIME-INR
INR: 1.2 (ref 0.00–1.49)
Prothrombin Time: 14.8 seconds (ref 11.6–15.2)

## 2010-12-12 LAB — COMPREHENSIVE METABOLIC PANEL
ALT: 14 U/L (ref 0–53)
ALT: 21 U/L (ref 0–53)
ALT: 19 U/L (ref 0–53)
AST: 36 U/L (ref 0–37)
AST: 52 U/L — ABNORMAL HIGH (ref 0–37)
Albumin: 1.9 g/dL — ABNORMAL LOW (ref 3.5–5.2)
Alkaline Phosphatase: 137 U/L — ABNORMAL HIGH (ref 39–117)
Alkaline Phosphatase: 201 U/L — ABNORMAL HIGH (ref 39–117)
Alkaline Phosphatase: 122 U/L — ABNORMAL HIGH (ref 39–117)
CO2: 20 mEq/L (ref 19–32)
CO2: 27 mEq/L (ref 19–32)
Calcium: 7.7 mg/dL — ABNORMAL LOW (ref 8.4–10.5)
Calcium: 8.1 mg/dL — ABNORMAL LOW (ref 8.4–10.5)
Chloride: 112 mEq/L (ref 96–112)
Chloride: 101 mEq/L (ref 96–112)
GFR calc Af Amer: >60 mL/min (ref >60)
GFR calc Af Amer: 47 mL/min — ABNORMAL LOW (ref >60)
GFR calc non Af Amer: >60 mL/min (ref >60)
GFR calc non Af Amer: >60 mL/min (ref >60)
Glucose, Bld: 90 mg/dL (ref 70–99)
Glucose, Bld: 97 mg/dL (ref 70–99)
Potassium: 3.6 mEq/L (ref 3.5–5.1)
Potassium: 3.3 mEq/L — ABNORMAL LOW (ref 3.5–5.1)
Sodium: 136 mEq/L (ref 135–145)
Sodium: 139 mEq/L (ref 135–145)
Total Bilirubin: 0.5 mg/dL (ref 0.3–1.2)
Total Bilirubin: 0.6 mg/dL (ref 0.3–1.2)

## 2010-12-12 LAB — CBC
HCT: 20.1 % — ABNORMAL LOW (ref 39.0–52.0)
HCT: 21.7 % — ABNORMAL LOW (ref 39.0–52.0)
HCT: 23.9 % — ABNORMAL LOW (ref 39.0–52.0)
HCT: 28.1 % — ABNORMAL LOW (ref 39.0–52.0)
HCT: 28.1 % — ABNORMAL LOW (ref 39.0–52.0)
HCT: 30 % — ABNORMAL LOW (ref 39.0–52.0)
Hemoglobin: 6.8 g/dL — CL (ref 13.0–17.0)
Hemoglobin: 7.2 g/dL — CL (ref 13.0–17.0)
Hemoglobin: 8 g/dL — ABNORMAL LOW (ref 13.0–17.0)
Hemoglobin: 8.6 g/dL — ABNORMAL LOW (ref 13.0–17.0)
Hemoglobin: 8.9 g/dL — ABNORMAL LOW (ref 13.0–17.0)
Hemoglobin: 8.7 g/dL — ABNORMAL LOW (ref 13.0–17.0)
Hemoglobin: 8.8 g/dL — ABNORMAL LOW (ref 13.0–17.0)
Hemoglobin: 9.3 g/dL — ABNORMAL LOW (ref 13.0–17.0)
MCHC: 32.5 g/dL (ref 30.0–36.0)
MCHC: 33 g/dL (ref 30.0–36.0)
MCHC: 33.6 g/dL (ref 30.0–36.0)
MCHC: 33.7 g/dL (ref 30.0–36.0)
MCHC: 31.2 g/dL (ref 30.0–36.0)
MCHC: 31.3 g/dL (ref 30.0–36.0)
MCHC: 32.8 g/dL (ref 30.0–36.0)
MCV: 82.6 fL (ref 78.0–100.0)
MCV: 83.2 fL (ref 78.0–100.0)
MCV: 77.1 fL — ABNORMAL LOW (ref 78.0–100.0)
MCV: 78.5 fL (ref 78.0–100.0)
MCV: 79.8 fL (ref 78.0–100.0)
Platelets: 166 10*3/uL (ref 150–400)
Platelets: 197 10*3/uL (ref 150–400)
Platelets: 117 10*3/uL — ABNORMAL LOW (ref 150–400)
Platelets: 123 10*3/uL — ABNORMAL LOW (ref 150–400)
Platelets: 148 10*3/uL — ABNORMAL LOW (ref 150–400)
RBC: 2.87 MIL/uL — ABNORMAL LOW (ref 4.22–5.81)
RBC: 3.36 MIL/uL — ABNORMAL LOW (ref 4.22–5.81)
RBC: 3.52 MIL/uL — ABNORMAL LOW (ref 4.22–5.81)
RBC: 3.49 MIL/uL — ABNORMAL LOW (ref 4.22–5.81)
RBC: 3.53 MIL/uL — ABNORMAL LOW (ref 4.22–5.81)
RBC: 3.79 MIL/uL — ABNORMAL LOW (ref 4.22–5.81)
RDW: 19.2 % — ABNORMAL HIGH (ref 11.5–15.5)
RDW: 20.1 % — ABNORMAL HIGH (ref 11.5–15.5)
RDW: 21.2 % — ABNORMAL HIGH (ref 11.5–15.5)
RDW: 21.2 % — ABNORMAL HIGH (ref 11.5–15.5)
RDW: 21.5 % — ABNORMAL HIGH (ref 11.5–15.5)
RDW: 21.5 % — ABNORMAL HIGH (ref 11.5–15.5)
RDW: 21.6 % — ABNORMAL HIGH (ref 11.5–15.5)
WBC: 6.8 10*3/uL (ref 4.0–10.5)
WBC: 6.9 10*3/uL (ref 4.0–10.5)
WBC: 8 10*3/uL (ref 4.0–10.5)
WBC: 11.9 10*3/uL — ABNORMAL HIGH (ref 4.0–10.5)
WBC: 17.1 10*3/uL — ABNORMAL HIGH (ref 4.0–10.5)
WBC: 18.1 10*3/uL — ABNORMAL HIGH (ref 4.0–10.5)

## 2010-12-12 LAB — BLOOD GAS, ARTERIAL
Bicarbonate: 15.8 mEq/L — ABNORMAL LOW (ref 20.0–24.0)
Drawn by: 103701
MECHVT: 550 mL
O2 Saturation: 92.5 %
O2 Saturation: 99.6 %
PEEP: 5 cmH2O
PEEP: 5 cmH2O
Patient temperature: 37
RATE: 14 resp/min
RATE: 14 resp/min
pCO2 arterial: 25.9 mmHg — ABNORMAL LOW (ref 35.0–45.0)
pH, Arterial: 7.312 — ABNORMAL LOW (ref 7.350–7.450)
pH, Arterial: 7.401 (ref 7.350–7.450)
pO2, Arterial: 178 mmHg — ABNORMAL HIGH (ref 80.0–100.0)

## 2010-12-12 LAB — RETICULOCYTES: Retic Count, Absolute: 51.7 10*3/uL (ref 19.0–186.0)

## 2010-12-12 LAB — BASIC METABOLIC PANEL
BUN: 9 mg/dL (ref 6–23)
BUN: 15 mg/dL (ref 6–23)
BUN: 27 mg/dL — ABNORMAL HIGH (ref 6–23)
CO2: 15 mEq/L — ABNORMAL LOW (ref 19–32)
CO2: 16 mEq/L — ABNORMAL LOW (ref 19–32)
CO2: 17 mEq/L — ABNORMAL LOW (ref 19–32)
Calcium: 7.1 mg/dL — ABNORMAL LOW (ref 8.4–10.5)
Calcium: 6.1 mg/dL — CL (ref 8.4–10.5)
Calcium: 6.2 mg/dL — CL (ref 8.4–10.5)
Calcium: 6.7 mg/dL — ABNORMAL LOW (ref 8.4–10.5)
Chloride: 105 mEq/L (ref 96–112)
Chloride: 113 mEq/L — ABNORMAL HIGH (ref 96–112)
Chloride: 113 mEq/L — ABNORMAL HIGH (ref 96–112)
Creatinine, Ser: 1.03 mg/dL (ref 0.4–1.5)
Creatinine, Ser: 1.33 mg/dL (ref 0.4–1.5)
Creatinine, Ser: 1.94 mg/dL — ABNORMAL HIGH (ref 0.4–1.5)
GFR calc Af Amer: >60 mL/min (ref >60)
GFR calc Af Amer: 33 mL/min — ABNORMAL LOW (ref >60)
GFR calc Af Amer: 38 mL/min — ABNORMAL LOW (ref >60)
GFR calc Af Amer: 47 mL/min — ABNORMAL LOW (ref >60)
GFR calc non Af Amer: >60 mL/min (ref >60)
GFR calc non Af Amer: 32 mL/min — ABNORMAL LOW (ref >60)
GFR calc non Af Amer: 37 mL/min — ABNORMAL LOW (ref >60)
Glucose, Bld: 102 mg/dL — ABNORMAL HIGH (ref 70–99)
Glucose, Bld: 116 mg/dL — ABNORMAL HIGH (ref 70–99)
Glucose, Bld: 91 mg/dL (ref 70–99)
Glucose, Bld: 94 mg/dL (ref 70–99)
Potassium: 3.7 mEq/L (ref 3.5–5.1)
Potassium: 4 mEq/L (ref 3.5–5.1)
Sodium: 132 mEq/L — ABNORMAL LOW (ref 135–145)
Sodium: 134 mEq/L — ABNORMAL LOW (ref 135–145)
Sodium: 135 mEq/L (ref 135–145)

## 2010-12-12 LAB — CROSSMATCH: Antibody Screen: NEGATIVE

## 2010-12-12 LAB — URINALYSIS, ROUTINE W REFLEX MICROSCOPIC
Glucose, UA: NEGATIVE mg/dL
Glucose, UA: NEGATIVE mg/dL
Ketones, ur: NEGATIVE mg/dL
Leukocytes, UA: NEGATIVE
Nitrite: NEGATIVE
Nitrite: NEGATIVE
Specific Gravity, Urine: 1.024 (ref 1.005–1.030)
pH: 6 (ref 5.0–8.0)
pH: 5.5 (ref 5.0–8.0)

## 2010-12-12 LAB — AMMONIA: Ammonia: 27 umol/L (ref 11–35)

## 2010-12-12 LAB — CULTURE, BLOOD (ROUTINE X 2)
Culture: NO GROWTH
Culture: NO GROWTH

## 2010-12-12 LAB — HEMOGLOBIN AND HEMATOCRIT, BLOOD
Hemoglobin: 7.1 g/dL — CL (ref 13.0–17.0)
Hemoglobin: 8.7 g/dL — ABNORMAL LOW (ref 13.0–17.0)

## 2010-12-12 LAB — IRON AND TIBC: Iron: <10 ug/dL — ABNORMAL LOW (ref 42–135)

## 2010-12-12 LAB — URINE CULTURE: Special Requests: NEGATIVE

## 2010-12-12 LAB — DIFFERENTIAL
Basophils Absolute: 0.1 10*3/uL (ref 0.0–0.1)
Basophils Absolute: 0 10*3/uL (ref 0.0–0.1)
Basophils Absolute: 0 10*3/uL (ref 0.0–0.1)
Basophils Relative: 1 % (ref 0–1)
Eosinophils Absolute: 0 10*3/uL (ref 0.0–0.7)
Eosinophils Relative: 0 % (ref 0–5)
Lymphocytes Relative: 27 % (ref 12–46)
Lymphs Abs: 1.1 10*3/uL (ref 0.7–4.0)
Lymphs Abs: 1.1 10*3/uL (ref 0.7–4.0)
Monocytes Absolute: 0.4 10*3/uL (ref 0.1–1.0)
Monocytes Absolute: 0.2 10*3/uL (ref 0.1–1.0)
Monocytes Relative: 2 % — ABNORMAL LOW (ref 3–12)
Neutro Abs: 4.4 10*3/uL (ref 1.7–7.7)
Neutro Abs: 16.6 10*3/uL — ABNORMAL HIGH (ref 1.7–7.7)
Neutrophils Relative %: 64 % (ref 43–77)
Neutrophils Relative %: 89 % — ABNORMAL HIGH (ref 43–77)

## 2010-12-12 LAB — WOUND CULTURE
Gram Stain: NONE SEEN
Gram Stain: NONE SEEN

## 2010-12-12 LAB — FOLATE: Folate: 6.2 ng/mL

## 2010-12-12 LAB — PROTIME-INR
INR: 1.3 (ref 0.00–1.49)
INR: 1.4 (ref 0.00–1.49)

## 2010-12-12 LAB — URINE MICROSCOPIC-ADD ON

## 2010-12-12 LAB — ANAEROBIC CULTURE

## 2010-12-12 LAB — VANCOMYCIN, TROUGH: Vancomycin Tr: 17 ug/mL (ref 10.0–20.0)

## 2010-12-12 LAB — APTT
aPTT: 37 seconds (ref 24–37)
aPTT: 35 seconds (ref 24–37)

## 2010-12-12 LAB — T4, FREE: Free T4: 0.56 ng/dL — ABNORMAL LOW (ref 0.80–1.80)

## 2010-12-12 LAB — RAPID URINE DRUG SCREEN, HOSP PERFORMED
Amphetamines: NOT DETECTED
Benzodiazepines: POSITIVE — AB

## 2010-12-12 LAB — VITAMIN B12: Vitamin B-12: 677 pg/mL (ref 211–911)

## 2010-12-12 LAB — ETHANOL: Alcohol, Ethyl (B): <5 mg/dL (ref 0–10)

## 2010-12-12 LAB — PREPARE RBC (CROSSMATCH)

## 2010-12-13 LAB — CBC
MCV: 77.2 fL — ABNORMAL LOW (ref 78.0–100.0)
MCV: 78.5 fL (ref 78.0–100.0)
Platelets: 63 10*3/uL — ABNORMAL LOW (ref 150–400)
Platelets: 78 10*3/uL — ABNORMAL LOW (ref 150–400)
RBC: 3.69 MIL/uL — ABNORMAL LOW (ref 4.22–5.81)
WBC: 5 10*3/uL (ref 4.0–10.5)
WBC: 5.4 10*3/uL (ref 4.0–10.5)

## 2010-12-13 LAB — BASIC METABOLIC PANEL
BUN: 13 mg/dL (ref 6–23)
CO2: 25 mEq/L (ref 19–32)
Calcium: 7.6 mg/dL — ABNORMAL LOW (ref 8.4–10.5)
Chloride: 106 mEq/L (ref 96–112)
Chloride: 108 mEq/L (ref 96–112)
Creatinine, Ser: 0.7 mg/dL (ref 0.4–1.5)
Creatinine, Ser: 0.84 mg/dL (ref 0.4–1.5)
GFR calc Af Amer: >60 mL/min (ref >60)
GFR calc non Af Amer: >60 mL/min (ref >60)
Potassium: 3.3 mEq/L — ABNORMAL LOW (ref 3.5–5.1)
Sodium: 137 mEq/L (ref 135–145)

## 2010-12-13 LAB — URINALYSIS, ROUTINE W REFLEX MICROSCOPIC
Bilirubin Urine: NEGATIVE
Ketones, ur: NEGATIVE mg/dL
Nitrite: NEGATIVE

## 2010-12-13 LAB — URINE MICROSCOPIC-ADD ON

## 2010-12-13 LAB — HEPATIC FUNCTION PANEL
ALT: 18 U/L (ref 0–53)
Indirect Bilirubin: 0.4 mg/dL (ref 0.3–0.9)
Total Protein: 5.9 g/dL — ABNORMAL LOW (ref 6.0–8.3)

## 2010-12-13 LAB — ETHANOL: Alcohol, Ethyl (B): 290 mg/dL — ABNORMAL HIGH (ref 0–10)

## 2010-12-13 LAB — PROTIME-INR
INR: 1.1 (ref 0.00–1.49)
Prothrombin Time: 14.8 seconds (ref 11.6–15.2)

## 2010-12-13 LAB — CROSSMATCH: ABO/RH(D): B NEG

## 2010-12-13 LAB — IRON AND TIBC
Iron: 15 ug/dL — ABNORMAL LOW (ref 42–135)
UIBC: 369 ug/dL

## 2010-12-13 LAB — DIFFERENTIAL
Basophils Absolute: 0.1 10*3/uL (ref 0.0–0.1)
Lymphs Abs: 1.9 10*3/uL (ref 0.7–4.0)
Monocytes Relative: 12 % (ref 3–12)
Neutrophils Relative %: 51 % (ref 43–77)

## 2010-12-13 LAB — TSH: TSH: 7.426 u[IU]/mL — ABNORMAL HIGH (ref 0.350–4.500)

## 2010-12-13 LAB — PHOSPHORUS: Phosphorus: 3.8 mg/dL (ref 2.3–4.6)

## 2010-12-13 LAB — CALCIUM: Calcium: 7.6 mg/dL — ABNORMAL LOW (ref 8.4–10.5)

## 2010-12-14 LAB — CBC
HCT: 25.2 % — ABNORMAL LOW (ref 39.0–52.0)
HCT: 28.3 % — ABNORMAL LOW (ref 39.0–52.0)
HCT: 24.9 % — ABNORMAL LOW (ref 39.0–52.0)
Hemoglobin: 8.3 g/dL — ABNORMAL LOW (ref 13.0–17.0)
Hemoglobin: 9.4 g/dL — ABNORMAL LOW (ref 13.0–17.0)
MCHC: 32.8 g/dL (ref 30.0–36.0)
MCV: 77.2 fL — ABNORMAL LOW (ref 78.0–100.0)
MCV: 81.4 fL (ref 78.0–100.0)
Platelets: 130 10*3/uL — ABNORMAL LOW (ref 150–400)
Platelets: 75 10*3/uL — ABNORMAL LOW (ref 150–400)
RBC: 3.27 MIL/uL — ABNORMAL LOW (ref 4.22–5.81)
RBC: 3.85 MIL/uL — ABNORMAL LOW (ref 4.22–5.81)
RDW: 19.1 % — ABNORMAL HIGH (ref 11.5–15.5)
WBC: 8.4 10*3/uL (ref 4.0–10.5)
WBC: 8.2 10*3/uL (ref 4.0–10.5)
WBC: 5.9 10*3/uL (ref 4.0–10.5)

## 2010-12-14 LAB — DIFFERENTIAL
Band Neutrophils: 0 % (ref 0–10)
Basophils Absolute: 0.1 10*3/uL (ref 0.0–0.1)
Basophils Relative: 1 % (ref 0–1)
Blasts: 0 %
Eosinophils Relative: 0 % (ref 0–5)
Lymphocytes Relative: 24 % (ref 12–46)
Lymphocytes Relative: 34 % (ref 12–46)
Lymphs Abs: 2 10*3/uL (ref 0.7–4.0)
Lymphs Abs: 2.8 10*3/uL (ref 0.7–4.0)
Metamyelocytes Relative: 0 %
Monocytes Relative: 8 % (ref 3–12)
Neutro Abs: 4.5 10*3/uL (ref 1.7–7.7)
Neutrophils Relative %: 67 % (ref 43–77)
Neutrophils Relative %: 55 % (ref 43–77)
Promyelocytes Absolute: 0 %

## 2010-12-14 LAB — POCT I-STAT, CHEM 8
BUN: 14 mg/dL (ref 6–23)
Chloride: 113 mEq/L — ABNORMAL HIGH (ref 96–112)
Creatinine, Ser: 1.2 mg/dL (ref 0.4–1.5)
Glucose, Bld: 102 mg/dL — ABNORMAL HIGH (ref 70–99)
HCT: 30 % — ABNORMAL LOW (ref 39.0–52.0)
Potassium: 4.1 mEq/L (ref 3.5–5.1)

## 2010-12-14 LAB — URINE CULTURE

## 2010-12-14 LAB — URINALYSIS, ROUTINE W REFLEX MICROSCOPIC
Bilirubin Urine: NEGATIVE
Glucose, UA: NEGATIVE mg/dL
Glucose, UA: NEGATIVE mg/dL
Glucose, UA: NEGATIVE mg/dL
Ketones, ur: NEGATIVE mg/dL
Leukocytes, UA: NEGATIVE
Leukocytes, UA: NEGATIVE
Nitrite: NEGATIVE
Nitrite: NEGATIVE
Protein, ur: >300 mg/dL — AB
Protein, ur: 100 mg/dL — AB
Specific Gravity, Urine: 1.007 (ref 1.005–1.030)
pH: 5.5 (ref 5.0–8.0)
pH: 7 (ref 5.0–8.0)
pH: 7 (ref 5.0–8.0)

## 2010-12-14 LAB — COMPREHENSIVE METABOLIC PANEL
AST: 63 U/L — ABNORMAL HIGH (ref 0–37)
BUN: 17 mg/dL (ref 6–23)
CO2: 18 mEq/L — ABNORMAL LOW (ref 19–32)
Calcium: 8.3 mg/dL — ABNORMAL LOW (ref 8.4–10.5)
Chloride: 110 mEq/L (ref 96–112)
Creatinine, Ser: 1.08 mg/dL (ref 0.4–1.5)
GFR calc Af Amer: >60 mL/min (ref >60)
GFR calc non Af Amer: >60 mL/min (ref >60)
Glucose, Bld: 82 mg/dL (ref 70–99)
Total Bilirubin: 0.4 mg/dL (ref 0.3–1.2)

## 2010-12-14 LAB — URINE MICROSCOPIC-ADD ON

## 2010-12-14 LAB — ETHANOL: Alcohol, Ethyl (B): 242 mg/dL — ABNORMAL HIGH (ref 0–10)

## 2010-12-14 LAB — PROTIME-INR: Prothrombin Time: 14.4 seconds (ref 11.6–15.2)

## 2010-12-14 LAB — RAPID URINE DRUG SCREEN, HOSP PERFORMED
Amphetamines: NOT DETECTED
Amphetamines: NOT DETECTED
Barbiturates: NOT DETECTED
Benzodiazepines: POSITIVE — AB
Cocaine: POSITIVE — AB
Tetrahydrocannabinol: NOT DETECTED

## 2010-12-14 LAB — AMMONIA: Ammonia: 27 umol/L (ref 11–35)

## 2010-12-15 ENCOUNTER — Other Ambulatory Visit: Payer: Medicaid Other

## 2010-12-15 DIAGNOSIS — E039 Hypothyroidism, unspecified: Secondary | ICD-10-CM

## 2010-12-15 LAB — DIFFERENTIAL
Eosinophils Absolute: 0.1 10*3/uL (ref 0.0–0.7)
Eosinophils Relative: 2 % (ref 0–5)
Lymphocytes Relative: 28 % (ref 12–46)
Lymphs Abs: 1.3 10*3/uL (ref 0.7–4.0)
Monocytes Relative: 11 % (ref 3–12)

## 2010-12-15 LAB — RAPID URINE DRUG SCREEN, HOSP PERFORMED
Amphetamines: NOT DETECTED
Amphetamines: NOT DETECTED
Barbiturates: NOT DETECTED
Barbiturates: NOT DETECTED
Benzodiazepines: POSITIVE — AB
Benzodiazepines: POSITIVE — AB
Cocaine: NOT DETECTED
Opiates: NOT DETECTED
Opiates: POSITIVE — AB

## 2010-12-15 LAB — POCT I-STAT, CHEM 8
Creatinine, Ser: 1 mg/dL (ref 0.4–1.5)
HCT: 25 % — ABNORMAL LOW (ref 39.0–52.0)
Hemoglobin: 8.5 g/dL — ABNORMAL LOW (ref 13.0–17.0)
Potassium: 4.3 mEq/L (ref 3.5–5.1)
Sodium: 146 mEq/L — ABNORMAL HIGH (ref 135–145)

## 2010-12-15 LAB — URINE MICROSCOPIC-ADD ON

## 2010-12-15 LAB — URINALYSIS, ROUTINE W REFLEX MICROSCOPIC
Bilirubin Urine: NEGATIVE
Bilirubin Urine: NEGATIVE
Glucose, UA: NEGATIVE mg/dL
Glucose, UA: NEGATIVE mg/dL
Ketones, ur: NEGATIVE mg/dL
Ketones, ur: NEGATIVE mg/dL
Ketones, ur: NEGATIVE mg/dL
Leukocytes, UA: NEGATIVE
Nitrite: NEGATIVE
Protein, ur: >300 mg/dL — AB
Protein, ur: 100 mg/dL — AB
pH: 6.5 (ref 5.0–8.0)
pH: 6 (ref 5.0–8.0)
pH: 6 (ref 5.0–8.0)

## 2010-12-15 LAB — CBC
HCT: 24.8 % — ABNORMAL LOW (ref 39.0–52.0)
MCHC: 32.2 g/dL (ref 30.0–36.0)
MCHC: 32.5 g/dL (ref 30.0–36.0)
MCV: 79.6 fL (ref 78.0–100.0)
MCV: 81.4 fL (ref 78.0–100.0)
Platelets: 115 10*3/uL — ABNORMAL LOW (ref 150–400)
RBC: 3.01 MIL/uL — ABNORMAL LOW (ref 4.22–5.81)
RBC: 3.11 MIL/uL — ABNORMAL LOW (ref 4.22–5.81)
RDW: 18.8 % — ABNORMAL HIGH (ref 11.5–15.5)
WBC: 3.6 10*3/uL — ABNORMAL LOW (ref 4.0–10.5)

## 2010-12-15 LAB — COMPREHENSIVE METABOLIC PANEL
ALT: 21 U/L (ref 0–53)
AST: 51 U/L — ABNORMAL HIGH (ref 0–37)
Albumin: 2.6 g/dL — ABNORMAL LOW (ref 3.5–5.2)
Calcium: 7.9 mg/dL — ABNORMAL LOW (ref 8.4–10.5)
Creatinine, Ser: 0.77 mg/dL (ref 0.4–1.5)
GFR calc Af Amer: >60 mL/min (ref >60)
Sodium: 139 mEq/L (ref 135–145)
Total Protein: 6.3 g/dL (ref 6.0–8.3)

## 2010-12-15 LAB — HEMOCCULT GUIAC POC 1CARD (OFFICE): Fecal Occult Bld: NEGATIVE

## 2010-12-15 LAB — TSH: TSH: 3.398 u[IU]/mL (ref 0.350–4.500)

## 2010-12-15 LAB — ETHANOL
Alcohol, Ethyl (B): 204 mg/dL — ABNORMAL HIGH (ref 0–10)
Alcohol, Ethyl (B): 294 mg/dL — ABNORMAL HIGH (ref 0–10)

## 2010-12-15 NOTE — Progress Notes (Signed)
TSH DONE TODAY Jeffrey [name] 

## 2010-12-16 ENCOUNTER — Ambulatory Visit: Payer: Medicaid Other | Attending: Orthopedic Surgery

## 2010-12-16 ENCOUNTER — Telehealth: Payer: Self-pay | Admitting: *Deleted

## 2010-12-16 DIAGNOSIS — IMO0001 Reserved for inherently not codable concepts without codable children: Secondary | ICD-10-CM | POA: Insufficient documentation

## 2010-12-16 DIAGNOSIS — M25619 Stiffness of unspecified shoulder, not elsewhere classified: Secondary | ICD-10-CM | POA: Insufficient documentation

## 2010-12-16 DIAGNOSIS — R5381 Other malaise: Secondary | ICD-10-CM | POA: Insufficient documentation

## 2010-12-16 DIAGNOSIS — M25519 Pain in unspecified shoulder: Secondary | ICD-10-CM | POA: Insufficient documentation

## 2010-12-16 LAB — COMPREHENSIVE METABOLIC PANEL
ALT: 45 U/L (ref 0–53)
ALT: 43 U/L (ref 0–53)
AST: 127 U/L — ABNORMAL HIGH (ref 0–37)
CO2: 24 mEq/L (ref 19–32)
Calcium: 7.6 mg/dL — ABNORMAL LOW (ref 8.4–10.5)
Chloride: 100 mEq/L (ref 96–112)
Creatinine, Ser: 1.77 mg/dL — ABNORMAL HIGH (ref 0.4–1.5)
GFR calc Af Amer: 50 mL/min — ABNORMAL LOW (ref >60)
GFR calc Af Amer: >60 mL/min (ref >60)
GFR calc non Af Amer: 41 mL/min — ABNORMAL LOW (ref >60)
Glucose, Bld: 114 mg/dL — ABNORMAL HIGH (ref 70–99)
Glucose, Bld: 104 mg/dL — ABNORMAL HIGH (ref 70–99)
Sodium: 140 mEq/L (ref 135–145)
Total Bilirubin: 1 mg/dL (ref 0.3–1.2)
Total Protein: 6.2 g/dL (ref 6.0–8.3)

## 2010-12-16 LAB — URINALYSIS, ROUTINE W REFLEX MICROSCOPIC
Glucose, UA: NEGATIVE mg/dL
Nitrite: NEGATIVE
Protein, ur: >300 mg/dL — AB
Protein, ur: >300 mg/dL — AB
pH: 6 (ref 5.0–8.0)

## 2010-12-16 LAB — CBC
HCT: 26 % — ABNORMAL LOW (ref 39.0–52.0)
HCT: 28.1 % — ABNORMAL LOW (ref 39.0–52.0)
Hemoglobin: 8.7 g/dL — ABNORMAL LOW (ref 13.0–17.0)
Hemoglobin: 9.2 g/dL — ABNORMAL LOW (ref 13.0–17.0)
Hemoglobin: 9.2 g/dL — ABNORMAL LOW (ref 13.0–17.0)
MCHC: 32.8 g/dL (ref 30.0–36.0)
MCHC: 33.5 g/dL (ref 30.0–36.0)
MCHC: 33.4 g/dL (ref 30.0–36.0)
MCV: 90.3 fL (ref 78.0–100.0)
MCV: 93.2 fL (ref 78.0–100.0)
RBC: 3.12 MIL/uL — ABNORMAL LOW (ref 4.22–5.81)
RDW: 16 % — ABNORMAL HIGH (ref 11.5–15.5)
RDW: 16 % — ABNORMAL HIGH (ref 11.5–15.5)

## 2010-12-16 LAB — POCT I-STAT, CHEM 8
Chloride: 106 mEq/L (ref 96–112)
HCT: 29 % — ABNORMAL LOW (ref 39.0–52.0)
Hemoglobin: 9.9 g/dL — ABNORMAL LOW (ref 13.0–17.0)
Potassium: 3.3 mEq/L — ABNORMAL LOW (ref 3.5–5.1)
Sodium: 142 mEq/L (ref 135–145)

## 2010-12-16 LAB — RAPID URINE DRUG SCREEN, HOSP PERFORMED
Amphetamines: NOT DETECTED
Barbiturates: NOT DETECTED
Barbiturates: NOT DETECTED
Benzodiazepines: NOT DETECTED
Cocaine: NOT DETECTED
Cocaine: NOT DETECTED
Opiates: NOT DETECTED
Opiates: NOT DETECTED
Tetrahydrocannabinol: NOT DETECTED

## 2010-12-16 LAB — HEPATIC FUNCTION PANEL
ALT: 22 U/L (ref 0–53)
Alkaline Phosphatase: 109 U/L (ref 39–117)
Bilirubin, Direct: 0.1 mg/dL (ref 0.0–0.3)
Indirect Bilirubin: 0.3 mg/dL (ref 0.3–0.9)

## 2010-12-16 LAB — URINE MICROSCOPIC-ADD ON

## 2010-12-16 LAB — BASIC METABOLIC PANEL
CO2: 24 mEq/L (ref 19–32)
Calcium: 8.7 mg/dL (ref 8.4–10.5)
Chloride: 106 mEq/L (ref 96–112)
GFR calc Af Amer: >60 mL/min (ref >60)
Glucose, Bld: 130 mg/dL — ABNORMAL HIGH (ref 70–99)
Potassium: 3.4 mEq/L — ABNORMAL LOW (ref 3.5–5.1)
Sodium: 136 mEq/L (ref 135–145)

## 2010-12-16 LAB — DIFFERENTIAL
Basophils Absolute: 0.1 10*3/uL (ref 0.0–0.1)
Basophils Relative: 1 % (ref 0–1)
Eosinophils Absolute: 0.1 10*3/uL (ref 0.0–0.7)
Eosinophils Absolute: 0.1 10*3/uL (ref 0.0–0.7)
Eosinophils Relative: 0 % (ref 0–5)
Eosinophils Relative: 2 % (ref 0–5)
Lymphocytes Relative: 47 % — ABNORMAL HIGH (ref 12–46)
Lymphocytes Relative: 50 % — ABNORMAL HIGH (ref 12–46)
Lymphs Abs: 3.4 10*3/uL (ref 0.7–4.0)
Monocytes Absolute: 0.4 10*3/uL (ref 0.1–1.0)
Monocytes Absolute: 0.5 10*3/uL (ref 0.1–1.0)
Monocytes Relative: 8 % (ref 3–12)
Monocytes Relative: 7 % (ref 3–12)
Neutro Abs: 3.8 10*3/uL (ref 1.7–7.7)
Neutrophils Relative %: 41 % — ABNORMAL LOW (ref 43–77)

## 2010-12-16 LAB — PLATELET COUNT: Platelets: 58 10*3/uL — ABNORMAL LOW (ref 150–400)

## 2010-12-16 LAB — ETHANOL
Alcohol, Ethyl (B): 192 mg/dL — ABNORMAL HIGH (ref 0–10)
Alcohol, Ethyl (B): 184 mg/dL — ABNORMAL HIGH (ref 0–10)

## 2010-12-16 LAB — HEMOGLOBIN AND HEMATOCRIT, BLOOD
HCT: 27.7 % — ABNORMAL LOW (ref 39.0–52.0)
Hemoglobin: 9.1 g/dL — ABNORMAL LOW (ref 13.0–17.0)

## 2010-12-16 NOTE — Telephone Encounter (Signed)
Spoke with patient and informed of below results

## 2010-12-16 NOTE — Telephone Encounter (Signed)
Message copied by Jimmy Footman on Wed Dec 16, 2010 10:09 AM ------      Message from: Jamie Brookes      Created: Tue Dec 15, 2010 11:19 PM      Regarding: please let this patient know       His thyroid labs are normal.       Thanks.      Amber            ----- Message -----         From: [facility] [place] Interface         Sent: 12/15/2010   8:01 PM           To: Visual merchandiser

## 2010-12-18 ENCOUNTER — Other Ambulatory Visit: Payer: Self-pay | Admitting: Family Medicine

## 2010-12-18 MED ORDER — CYCLOBENZAPRINE HCL 10 MG PO TABS: 10.0000 mg | ORAL_TABLET | Freq: Three times a day (TID) | ORAL | Status: DC | PRN

## 2010-12-24 ENCOUNTER — Ambulatory Visit: Payer: Medicaid Other

## 2010-12-25 ENCOUNTER — Ambulatory Visit (INDEPENDENT_AMBULATORY_CARE_PROVIDER_SITE_OTHER): Payer: Medicaid Other | Admitting: Family Medicine

## 2010-12-25 VITALS — BP 143/77 | HR 78 | Temp 98.2°F

## 2010-12-25 DIAGNOSIS — T8130XA Disruption of wound, unspecified, initial encounter: Secondary | ICD-10-CM | POA: Insufficient documentation

## 2010-12-25 DIAGNOSIS — M87059 Idiopathic aseptic necrosis of unspecified femur: Secondary | ICD-10-CM

## 2010-12-25 DIAGNOSIS — N529 Male erectile dysfunction, unspecified: Secondary | ICD-10-CM | POA: Insufficient documentation

## 2010-12-25 MED ORDER — SILDENAFIL CITRATE 100 MG PO TABS: 100.0000 mg | ORAL_TABLET | Freq: Every day | ORAL | Status: DC | PRN

## 2010-12-25 NOTE — Assessment & Plan Note (Addendum)
PT is having a hard time ejaculating. Has tried viagra and cialis before. Wants to try Viagra again. Rx written. Pt to be allowed to keep med in room.

## 2010-12-25 NOTE — Patient Instructions (Signed)
I have put in a referral to a pain clinic. I have asked to have the [facility] pain clinic. You have a prescription for Viagra and your wound care supplies.  Use lotion on your skin.  Congrats on your new relationship.

## 2010-12-25 NOTE — Assessment & Plan Note (Signed)
This is a chronic source of pain for him. Plan to refer to pain clinic.

## 2010-12-27 NOTE — Assessment & Plan Note (Signed)
Pt has a left lateral knee wound that has reopened. He knocks it against his wheel chair and once the wound is opened he has a bad habit of picking a this skin.  Plant to write for wound care materials and dressing changes to be done every other day at his facility until his wounds improve.

## 2010-12-27 NOTE — Progress Notes (Signed)
Wound: Pt has a left leg wound. He states that he frequently knocks his left knee on his wheel chair and his chronic leg wound has reopened. He often picks at wounds on his body and says that as long as it is not covered he is going to keep picking at it. He has to get doctors orders and prescriptions to get his leg wounds attended to.   Erectile Dysfunction: Pt has had trouble achieving an erection over the last few weeks. He has recently started a new relationship. He says he cares a lot about her and there is no problems in the relationship but is concerned about his performance. He has tried [place] and Cialis in the past. He prefers Viagra and wants to try it again.   ROS: neg except as noted in HPI.   PE:  Gen: pt is sitting comfortably in his wheel chair.  Ext: Pt has a large left lateral knee wound that measures at least 3 cm x 7 cm, no signs of infection, no erythema or purulance.

## 2010-12-31 ENCOUNTER — Ambulatory Visit: Payer: Medicaid Other

## 2011-01-07 ENCOUNTER — Ambulatory Visit: Payer: Medicaid Other | Attending: Orthopedic Surgery

## 2011-01-07 DIAGNOSIS — M25619 Stiffness of unspecified shoulder, not elsewhere classified: Secondary | ICD-10-CM | POA: Insufficient documentation

## 2011-01-07 DIAGNOSIS — IMO0001 Reserved for inherently not codable concepts without codable children: Secondary | ICD-10-CM | POA: Insufficient documentation

## 2011-01-07 DIAGNOSIS — R5381 Other malaise: Secondary | ICD-10-CM | POA: Insufficient documentation

## 2011-01-07 DIAGNOSIS — M25519 Pain in unspecified shoulder: Secondary | ICD-10-CM | POA: Insufficient documentation

## 2011-01-12 ENCOUNTER — Ambulatory Visit (INDEPENDENT_AMBULATORY_CARE_PROVIDER_SITE_OTHER): Payer: Medicaid Other | Admitting: Family Medicine

## 2011-01-12 VITALS — BP 144/94 | HR 91

## 2011-01-12 DIAGNOSIS — N529 Male erectile dysfunction, unspecified: Secondary | ICD-10-CM

## 2011-01-12 DIAGNOSIS — F1110 Opioid abuse, uncomplicated: Secondary | ICD-10-CM

## 2011-01-12 DIAGNOSIS — F1190 Opioid use, unspecified, uncomplicated: Secondary | ICD-10-CM | POA: Insufficient documentation

## 2011-01-12 MED ORDER — VARDENAFIL HCL 20 MG PO TABS: 20.0000 mg | ORAL_TABLET | ORAL | Status: DC | PRN

## 2011-01-12 NOTE — Patient Instructions (Signed)
Pt given a handout from [place] about the prices of Levitra and Viagra. Given a Levitra Rx.

## 2011-01-12 NOTE — Progress Notes (Signed)
Opiate use: Pt is using opiates to control the pain in his leg and hip. He has significant arthritis and is in daily pain. He has been taking Oxycodone 30 mg with percocet for break through pain. There is a report from his assisted living facility that he may be diverting the meds and selling them. He says that he knows that there is a lady at his facility who is doing that and that she is blaming him for doing it. I discussed just checking his urine today. He is in aggreance.  ED:  Pt is still having problems with ejaculation. Says he can get an erection but can not achieve ejaculation. He has not been able to try the viagra because it is too expensive. Wants to know if there are samples or a cheaper way to get it.   ROS: Still having some leg pain, otherwise neg except as noted in HPI.

## 2011-01-12 NOTE — Assessment & Plan Note (Signed)
Pt says he has been taking his Oxycodone as prescribed. He is happy to give [place] a urine sample.  I will discuss with him about the test when it comes back and also discuss increasing his medication dose at his next visit.

## 2011-01-12 NOTE — Assessment & Plan Note (Signed)
Pt continues to have problems with ejaculation. He has not been able to afford the Viagra.  Levitra is cheaper at [facility] so given Rx for Levitra. Told to just buy a few of them at first if he wants to just try them.

## 2011-01-14 ENCOUNTER — Ambulatory Visit: Payer: Medicaid Other

## 2011-01-15 ENCOUNTER — Telehealth: Payer: Self-pay | Admitting: *Deleted

## 2011-01-15 LAB — OPIATE, QUANTITATIVE, URINE
Hydromorphone - Total: NEGATIVE NG/ML
Oxycodone - Total: 2872 NG/ML — ABNORMAL HIGH
Oxymorphone: 859 NG/ML — ABNORMAL HIGH

## 2011-01-15 NOTE — Telephone Encounter (Signed)
Spoke with Lurena Joiner @ [facility] and she stated that she had already received his results.Loralee Pacas [place]

## 2011-01-19 NOTE — Consult Note (Signed)
NAME:  Jeffrey [name], Jeffrey [name]              ACCOUNT NO.:  [id number]   MEDICAL RECORD NO.:  [id number]          PATIENT TYPE:  INP   LOCATION:  1340                         FACILITY:  [facility]   PHYSICIAN:  Beckey Rutter, MD  DATE OF BIRTH:  11-Jan-1956   DATE OF CONSULTATION:  04/21/2009  DATE OF DISCHARGE:                                 CONSULTATION   REASON FOR CONSULTATION:  Multiple medical problems including management  for diabetes, hepatitis, liver disease, tobacco abuse and malnutrition.   HISTORY OF PRESENT ILLNESS:  This is a 55 year old Caucasian male  admitted 6 days ago with altered mental status, sepsis, for left lower  extremity infection and fasciitis.  The patient received remained in the  ICU with improvement to his condition.  He transferred to the floor.  He  still has the wound vac in place.  The patient was on antibiotics, Zosyn  and vancomycin, with negative cultures to date.  The patient has high  blood pressure in the MICU during the ICU stay treated with clonidine  patch.   The patient was not compliant with his medications prior to his ICU  admission, he was homeless.  The patient has history of alcohol abuse  and hepatitis C with questionable of liver disease.   PAST MEDICAL HISTORY:  1. The patient has past medical history significant for liver      cirrhosis.  2. Chronic back pain.  3. History of subclinical hypothyroidism.  4. Alcohol abuse and dependency.  5. Tobacco abuse.  6. Homelessness.  7. For the last week, the patient was in the hospital for the left      lower extremities infection, cellulitis and fasciitis.   SOCIAL HISTORY:  As discussed above the patient is homeless, he is  noncompliant with the medication.  He has ethanol dependency and abuse  and tobacco abuse.  He denied recreational drugs or IV drugs.   FAMILY HISTORY:  Noncontributory.   MEDICATION ALLERGIES:  Not known to have medication allergies.   MEDICATIONS:  1. Clonidine  0.1 mg patch q. 7 days.  2. Dextrose 5% with normal saline 0.9.  3. Lopressor 25 mg p.o. q.8 h.  4. Multivitamins daily.  5. Nicotine patch 21 mg daily.  6. Nutritional supplements Ensure p.o. t.i.d.  7. Protonix 40 mg IV daily.  8. Zosyn 3.375 mg IV.  9. Protein supplement p.o. t.i.d.  10.Vancomycin 750 mg IV q. 12.  11.Tylenol 650 p.r.n. q.4 h.  12.Albuterol 2.5 mg inhaler/nebulizer q.3 h. p.r.n.  13.Xanax 0.5 mg p.o. q.8 h.  14.Mechanical VTE prophylaxis continuous.  15.Vicodin 2 tablets p.o. q.3 h. p.r.n.   REVIEW OF SYSTEMS:  14 point review of systems noncontributory.   PHYSICAL EXAMINATION:  VITAL SIGNS:  Temperature is 98.2, pulse 85,  respiratory rate is 20.  Systolic blood pressure is 167, diastolic blood  pressure was 98.  Saturation was 95.   Vancomycin level is a 17.0, sodium is 136, potassium 3.7, chloride is  114, bicarb 18, glucose 94.  BUN is 9, creatinine is 1.0.  White blood  count is 8.0.  Hemoglobin  is a 8.4, hematocrit 25.9, platelet count is  166.   ASSESSMENT AND SUGGESTION:  This is a 55 year old with multiple medical  problems recently with sepsis, altered mental status and ICU admission  for severe infection of the left buttocks and left lower extremities,  status post his fasciotomy and debridement.  Currently his 2 wound vac's  managed by the surgical team and physical therapy.   1. His other medical problems including a liver cirrhosis as mentioned      in the history.  The extent of the liver disease is not clear.  It      seems stable at this time.  2. Hypertension.  His blood pressure is not controlled.  He was taking      clonidine patch during his hospital stay, but at this time the      patient can take oral medication  the known the fact that the      patient's noncompliance and homeless( The clonidine can have a      significant rebound upon sudden discontinuation) and for this      reason I will discontinue the clonidine and start oral  medication      only.  The patient is taking metoprolol 25 mg every 8 hours.  Will      change the medication to 50 mg b.i.d. if the heart rate is      permissible.  I would add Norvasc 5 mg today which will be      increased to 10 mg if the blood pressure still not within an      acceptable control.  3. Anemia.  The etiology of anemia is multifactoriall.  The patient      has recent sepsis with very extensive infection.  Nevertheless, his      MCV on August 11, is only 43.  The patient has symptoms of      malnutrition, factoring he has been homeless for prolonged period      of time together with his signet significant history of ethanol      abuse.  At this time we will check his anemia profile although      received several blood transfusion which might alter the      interpretation of the anemia profile.  I will continue monitor his      H and H and we will obtain a CBC with differential, hence he did      not have it for the last 5 days, since he did not has differential      for the last 5 days.  4. Malnutrition.  Will check his prealbumin and will continue on      Ensure for now and we will continue with multivitamins  5. Ethanol abuse.  We will add thiamine and folic acid to the      vitamins.  There is no clinical suspicion for withdrawal and this      time.  6. Tobacco abuse.  Will continue and on nicotine patch 21 mg.  I will      request tobacco cessation counseling.  7. Anxiety.  The patient on Xanax 0.5 mg three times a day.  I will      continue on the same dose.  8. Questionable subclinical hypothyroidism.  The plan for this will be      a follow-up when the patient is stable.  Considering the fact that      the patient has admission to  the ICU with a sepsis and the serious      infection the patient is suffering currently from the thyroid      function tests will not be reliable and would probably given a      picture of a thyroid sick syndrome.  For that reason, I  will      consider checking his thyroid function tests when the patient is      stable for 2-3 weeks after his ICU admission.   The extensive infection: up-to-date the cultures are negative, which  include the anaerobic culture.  Blood cultures negative.  Urine culture  is negative as well.  The management of antibiotic is done by the  surgery/primary team.  The patient seems improving as per the charting.  Will defer the management for the infection to the primary team.  Thank you very much for the consultation.  [facility] hospital[facility] team F will  follow through with you.      Beckey Rutter, MD  Electronically Signed     EME/MEDQ  D:  04/21/2009  T:  04/21/2009  Job:  [id number]

## 2011-01-19 NOTE — Discharge Summary (Signed)
NAME:  Jeffrey [name], Jeffrey [name] NO.:  [id number]   MEDICAL RECORD NO.:  [id number]          PATIENT TYPE:  IPS   LOCATION:  0506                          [facility]ACILITY:  [facility]   PHYSICIAN:  Anselm Jungling, MD  DATE OF BIRTH:  03-14-1956   DATE OF ADMISSION:  01/09/2009  DATE OF DISCHARGE:  01/15/2009                               DISCHARGE SUMMARY   IDENTIFYING DATA/REASON FOR ADMISSION:  This is the third [facility] admission  within the past month for Bow, a 55 year old single Caucasian male with  a history of severe alcohol dependence and Axis II disorder.  He  represented after choosing to leave the [facility], to which he  had been referred following his last course of treatment here, just days  prior to this readmission.  Please refer to the admission note for  further details pertaining to the symptoms, circumstances and history  that led to his hospitalization.  He was given an initial Axis I  diagnosis of alcohol dependence.   MEDICAL/LABORATORY:  The patient was again medically and physically  assessed in the emergency department, and then again by the psychiatric  nurse practitioner upon arrival to the inpatient psychiatry service.  He  comes to [place] with a history of skin lesions, NOS, hypothyroidism, and  left ankle sprain from December 27, 2008.  He also has a history of alcohol-  induced hepatitis, chronic pain syndrome, and a history of  thrombocytopenia.  He was continued on his usual medical regimen of  levothyroxine, Protonix, atenolol, multivitamin daily, thiamine daily  and ibuprofen.  There were no acute medical issues.   HOSPITAL COURSE:  The patient was admitted to the adult inpatient  psychiatric service.  He presents as a tall, adequately nourished, but  ill-appearing gentleman, who was nonpsychotic, and fully-oriented.  He  denied any alcohol relapse during the time that he had been away from  [place].  He was moderately irritable, and evidenced an attitude  of  entitlement; however, he wanted to be in our inpatient program, and  indicated that he would work towards a different discharge plan through  a different program.  He maintained that the reason he left [facility], was that other residents there were drinking.   The patient was involved in the therapeutic milieu and he attended daily  process groups, including those geared towards a 12-step recovery.  He  did not have any significant withdrawal symptoms.  He worked with the  case Production designer, theatre/television/film towards an aftercare plan, which was appropriate for him,  enacting on the seventh hospital day.   DISCHARGE AND AFTER-CARE PLAN:  The patient was to go immediately to the  [facility], a residential program for chemical dependency in  [place].  He was also to follow up otherwise with [facility] in [place], with an intake appointment on May 19th at 2:30  p.m.   DISCHARGE MEDICATIONS:  1. Levothyroxine 50 mcg daily.  2. Protonix 40 mg daily.  3. Atenolol 25 mg daily.  4. Multivitamin daily.  5. Thiamine 100 mg daily.  6. Ibuprofen 800 mg  t.i.d. as needed for pain.  7. Trazodone 50 mg q.h.s.   DISCHARGE DIAGNOSES:  AXIS I:  Alcohol dependence, early remission.  AXIS II:  Deferred.  AXIS III:  1.  History of gastroesophageal reflux disease.  1. Hypothyroidism.  2. Thrombocytopenia.  3. Chronic pain.  AXIS IV:  Stressors, severe.  AXIS V:  Global assessment of functioning on discharge 50.      Anselm Jungling, MD  Electronically Signed     SPB/MEDQ  D:  01/17/2009  T:  01/17/2009  Job:  [id number]

## 2011-01-19 NOTE — H&P (Signed)
NAME:  Jeffrey [name], Jeffrey [name] NO.:  [id number]   MEDICAL RECORD NO.:  [id number]          PATIENT TYPE:  IPS   LOCATION:  0405                          FACILITY:  [facility]   PHYSICIAN:  Anselm Jungling, MD  DATE OF BIRTH:  04/01/1956   DATE OF ADMISSION:  02/22/2009  DATE OF DISCHARGE:                       PSYCHIATRIC ADMISSION ASSESSMENT   This is a voluntary admission to the services of Anselm Jungling, MD.   HISTORY:  This is a 55 year old single white male who appears much older  than his stated age.  Mr. Tidwell just left [place] on February 13, 2009.  He  was dropped off at the [facility] Emergency Room by a friend to get  detoxed from alcohol.  He was lethargic and unable to tell them why he  was actually there.  His UDS was positive for cocaine and  benzodiazepines.  His alcohol level was 107.  As already stated, he was  just with [place] for similar reasons February 09, 2009 to February 13, 2009.   SOCIAL HISTORY:  He was living with a friend, but basically considers  himself homeless.   FAMILY HISTORY:  Negative.   ALCOHOL/DRUG HISTORY:  He has polysubstance abuse currently using  alcohol and cocaine.  He is followed at [facility] by Dr. Reche Dixon.  He  has not established outpatient psychiatric care.   MEDICAL PROBLEMS:  1. Chronic back pain.  2. Hypothyroidism.  3. Hepatitis C.   MEDICATIONS:  1. I do not have his last discharge summary yet, but when he was here      before, he was on Synthroid 50 mcg a day.  2. Protonix 40 mg a day.  3. Multivitamin daily.  4. Trazodone 50 at bedtime.   DRUG ALLERGIES:  NO KNOWN DRUG ALLERGIES.   PHYSICAL EXAMINATION:  GENERAL:  He was medically cleared in the ED at  [facility].  VITAL SIGNS:  His vital signs showed that his pulse ranged from 78-88,  his respirations were 14-20, his temperature was 97-99.4, his blood  pressure ranged from 130/76 to 163/95.   MENTAL STATUS EXAM:  He is alert and oriented.  He is sitting up in a  wheelchair.  He appears much older than his chronological age.  He  appears chronically ill.  He is casually dressed.  He can have good eye  contact.  He can be quite talkative when he gets going.  He reports  being depressed.  He is not actively suicidal or homicidal.  He is not  having auditory or visual hallucinations.   LABORATORY DATA:  He also had pneumonia level drawn, it was 107.   DIAGNOSES:  AXIS I:  Alcohol dependence, cocaine abuse.  AXIS II:  Deferred.  AXIS III:  Hepatitis, cirrhosis, chronic back pain, hypothyroidism.  AXIS IV:  Severe, once again he claims he is homeless.  AXIS V:  35.   PLAN:  The plan is to admit for detox, to assess the need for other  medications.  He was originally started on low-dose Librium protocol,  however, he will be detoxed using Ativan 1 mg  p.o. q.i.d. for 1 day;  then 1 mg t.i.d. for a day; 1 mg p.o. b.i.d. a day; then 1 mg in the  morning and stop.  We will have the case manager work on placement.  Estimated length of stay is 3-5 days.      Mickie Leonarda Salon, P.A.-C.      Anselm Jungling, MD  Electronically Signed    MD/MEDQ  D:  02/22/2009  T:  02/22/2009  Job:  [id number]

## 2011-01-19 NOTE — Discharge Summary (Signed)
NAME:  Jeffrey [name] NO.:  [id number]   MEDICAL RECORD NO.:  [id number]          PATIENT TYPE:  IPS   LOCATION:  0405                          FACILITY:  [facility]   PHYSICIAN:  Anselm Jungling, MD  DATE OF BIRTH:  20-Sep-1955   DATE OF ADMISSION:  02/22/2009  DATE OF DISCHARGE:  02/26/2009                               DISCHARGE SUMMARY   IDENTIFYING DATA AND REASON FOR ADMISSION:  This was one of several  inpatient psychiatric admissions for Jeffrey [name], a 55 year old single white  male, who had just been discharged from our facility 9 days prior to  this readmission.  He had been brought back to the [facility] Emergency  Room by a friend because he had again relapsed on alcohol.  He was  lethargic at that point and in addition to the alcohol his UDS was  positive for cocaine and benzodiazepines.  Please refer to the admission  note for further details pertaining to the symptoms, circumstances, and  history that led to his hospitalization.  He was given an initial Axis I  diagnosis of alcohol dependence and cocaine abuse.   MEDICAL AND LABORATORY:  The patient returned to [place] with a history of  subjective chronic back pain, hypothyroidism, and hepatitis C.  He also  had a left ankle sprain which showed more in the way of swelling and  edema because he had not been taking care of it.  He was medically and  physically assessed in the emergency room and by the psychiatric nurse  practitioner.  Aside from effects of alcohol and the above-mentioned  ankle sprain, there were no active medical issues.   HOSPITAL COURSE:  The patient was admitted to the adult inpatient  psychiatric service.  He presented as a tall, chronically ill-appearing  and older than stated age gentleman who was initially evaluated by Dr.  Dub Mikes and cared for by Dr. Katrinka Blazing.  The undersigned assumed his care on  the 4th hospital day.  At that time, he had been detoxified, and was  displaying essentially normal  mental status.  The challenge at that  point was determining an appropriate discharge and aftercare plan.   The patient had learned of a program called [facility].  However,  when our case manager called that program they indicated that they would  not accept an individual who was homeless.  This being the case, the  patient was referred to the [facility] and he agreed to that  discharge and aftercare plan.   AFTERCARE:  As above.  He was to transfer directly to the [facility] on the date of discharge.   DISCHARGE MEDICATIONS:  None.   DISCHARGE DIAGNOSES:  AXIS I:  Alcohol dependence, early remission.  Cocaine abuse.  AXIS II:  Deferred.  AXIS III:  History of hepatitis C, hypothyroidism, ankle sprain.  AXIS IV:  Stressors, severe.  AXIS V:  Global Assessment of Functioning on discharge 50.      Anselm Jungling, MD  Electronically Signed     SPB/MEDQ  D:  02/27/2009  T:  02/27/2009  Job:  [id number]
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
## 2011-01-19 NOTE — Discharge Summary (Signed)
NAME:  LONG, BRIMAGE NO.:  [id number]   MEDICAL RECORD NO.:  [id number]          PATIENT TYPE:  IPS   LOCATION:  0304                          FACILITY:  [facility]   PHYSICIAN:  Anselm Jungling, MD  DATE OF BIRTH:  [birth year]   DATE OF ADMISSION:  12/23/2008  DATE OF DISCHARGE:  12/31/2008                               DISCHARGE SUMMARY   IDENTIFYING DATA AND REASON FOR ADMISSION:  This was an inpatient  psychiatric admission for Jeffrey [name], a 55 year old single white male admitted  for alcohol detoxification.  His last alcohol had been 2 days prior to  admission.  Please refer to the admission note for further details  pertaining to the symptoms, circumstances and history that led to his  hospitalization.  He was given initial Axis I diagnosis of alcohol  dependence, and rule out psychosis NOS.   MEDICAL AND LABORATORY:  The patient was medically and physically  assessed by the psychiatric nurse practitioner.  He had multiple skin  lesions that he had been picking and scratching at for some time.  The  etiology of these was not clear.  He also has chronic back pain,  normocytic anemia, and thrombocytopenia.   Extensive laboratory was undertaken to further elucidate the nature of  his anemia.  It appeared that most of his medical difficulties were  symptoms of chronic alcoholism.   HOSPITAL COURSE:  The patient was admitted to the adult inpatient  psychiatric service.  He presented as a large, ill-appearing male who  was very tremulous and irritable.  He was placed on a Librium withdrawal  protocol.   During his hospital course, he developed a gastrointestinal illness that  appeared to be viral in origin, characterized by nausea, vomiting,  diarrhea.  This cleared spontaneously.  Appropriate supportive measures  were maintained, and at 1 point he was placed on contact precautions.   The patient appeared appropriate for discharge on the tenth hospital  day.  At that  time he had completed his withdrawal process, and was  absent of suicidal ideation.  He agreed to the following aftercare plan.   AFTERCARE:  The patient was to follow-up with [facility]  [place], which he was to set up of his own initiative.  He is also to  follow-up with [facility] for pain management issues.   DISCHARGE MEDICATIONS:  Tenormin 25 mg daily and Protonix 40 mg daily.   DISCHARGE DIAGNOSES:  AXIS I: Alcohol dependence.  AXIS II: Deferred.  AXIS III: History of hypertension, gastroesophageal reflux disease,  numerous skin lesions, unclear etiology.  AXIS IV: Stressors severe.  AXIS V: GAF on discharge of 50.      Anselm Jungling, MD  Electronically Signed     SPB/MEDQ  D:  01/09/2009  T:  01/09/2009  Job:  [id number]

## 2011-01-19 NOTE — Op Note (Signed)
NAME:  LYNDOL, VANDERHEIDEN              ACCOUNT NO.:  [id number]   MEDICAL RECORD NO.:  [id number]          PATIENT TYPE:  INP   LOCATION:  1425                         FACILITY:  [facility]   PHYSICIAN:  Bernette Redbird, M.D.   DATE OF BIRTH:  Aug 31, 1956   DATE OF PROCEDURE:  09/06/2007  DATE OF DISCHARGE:                               OPERATIVE REPORT   PROCEDURE:  Upper endoscopy.   INDICATIONS:  A 55 year old with hemoglobin of 7.5 and recent dark  stools after possible exposure to indomethacin for a gout attack.  The  patient is a heavy drinker.   FINDINGS:  Some retained food in the stomach but no blood or prospective  bleeding site identified.   PROCEDURE IN DETAIL:  The nature, purpose and risks of the procedure  have been discussed with the patient who provided written consent.  He  was brought in a fasted state from his hospital room to the endoscopy  unit and sedated with Phenergan 25 mg IV, fentanyl 50 mcg IV and Versed  5 mg IV without clinical instability.  The Pentax video endoscope was  passed under direct vision.  There were no overt laryngeal  abnormalities.  There was a little bit of blood in the hypopharynx.   The esophagus had normal mucosa except right at the gastroesophageal  junction where there were some minimal streaks of erythema.  With  insufflation the esophagus flattened out nicely but during times of  Valsalva or retching, there was some slight prominence of the distal  esophageal vein suggesting the presence of possible small distal  esophageal varices.  There was no stigma of hemorrhage in this area.   The stomach was entered.  It contained no blood or coffee ground  material.  There was some amber stained food material scattered along  the greater curve aspect and it was difficult to get an optimal  inspection of the pylorus due to the presence of that food debris.  I  did not see any gastritis, erosions, ulcers, masses or polyps and a  retroflexed view  of the cardia was normal, specifically without evidence  of gastric varices.   The duodenal bulb and second duodenum were normal except for a little  bit of blotchy erythema here and there.   The scope was removed from the patient.  No biopsies were obtained.  He  tolerated the procedure well and there no apparent complications.   To confirm that he truly has had melena, since his BUN was normal and  there were no definite findings on this exam, I did a rectal exam  immediately following the procedure which showed a grossly normal rectal  exam and jet black pasty stool consistent with melena.   IMPRESSION:  1. Probable recent gastrointestinal bleed characterized by melenic      stool confirmed by current rectal exam, and by significantly low      hemoglobin, but without evidence of blood in the stomach at the      time of this examination.  2. No definite source of bleeding identified on this exam.  3. Examination somewhat obscured  by the presence of retained food      which might have hidden small lesions such as a small antral ulcer.  4. Possible small distal esophageal varices.  5. Possible minimal distal esophagitis.   COMMENT:  The patient's hemoglobin on October 29 of this year, almost  exactly 2 months ago, was 12.3 and this morning it was 7.5, so it is  clear that he has had blood loss at some point in the interim and the  black stool would suggest that it has been recent blood loss despite the  absence of findings on the current exam.   RECOMMENDATIONS:  1. I would favor empiric antipeptic therapy for a month.  A proton      pump inhibitor of any type once daily would probably suffice (while      in-house in fact, I would probably use twice a day).  2. If it is felt essential to use NSAID therapy for possible gout      attack as he appears to be having, I would try to hold off for at      least a week before using it.  3. In the future, if he needs to be treated either  with aspirin or      NSAIDs, I would treat concurrently with PPI prophylaxis once daily      during the time that such ulcerogenic medications are being taken.  4. The patient is a candidate for screening colonoscopy when medically      stable if he is agreeable to doing so.           ______________________________  Bernette Redbird, M.D.     RB/MEDQ  D:  09/06/2007  T:  09/06/2007  Job:  [id number]   cc:   Emeline Darling, Dr  Dala Dock

## 2011-01-19 NOTE — Op Note (Signed)
NAME:  JHAN, CONERY              ACCOUNT NO.:  [id number]   MEDICAL RECORD NO.:  [id number]          PATIENT TYPE:  INP   LOCATION:  1236                         [facility]ACILITY:  [facility]   PHYSICIAN:  Nadara Mustard, MD     DATE OF BIRTH:  02/21/1956   DATE OF PROCEDURE:  04/15/2009  DATE OF DISCHARGE:                               OPERATIVE REPORT   PREOPERATIVE DIAGNOSIS:  Necrotizing fasciitis, left hip and left leg.   POSTOPERATIVE DIAGNOSIS:  Necrotizing fasciitis, left hip and left leg.   PROCEDURES:  1. Fasciotomies, left leg.  2. Debridement, necrotic muscle.  3. Application of a large wound VAC.   SURGEON:  Nadara Mustard, MD   ANESTHESIA:  General.   ESTIMATED BLOOD LOSS:  Minimal.   ANTIBIOTICS:  Vancomycin and Zosyn preoperatively.   DRAINS:  None.   COMPLICATIONS:  None.   CULTURES:  Obtained x2.   DISPOSITION:  To ICU in stable condition, intubated.   INDICATIONS FOR PROCEDURE:  The patient is a 55 year old gentleman who  presents with a rapidly progressive cellulitis to the lateral aspect of  the left leg with petechial rash as well as cellulitis and petechial  rash to the lateral left hip.  The patient was initially seen by Dr.  Johna Sheriff and I was consulted for evaluation and management of the  progressive necrotizing fasciitis to the left leg.  The patient's past  medical history is significant for hepatitis and alcohol abuse.  The  patient did have proteinuria and myoglobinuria consistent with necrotic  muscle.  Due to the patient's altered mental status and progressive  cellulitis of petechial rash with fluctuance to the left leg, the  patient presents at this time for emergent I and D of the left hip and  left leg.  This was considered emergency and the patient was agreeable  to surgery but it is unaware if he quite aware of the consequences of  the infection.   DESCRIPTION OF PROCEDURE:  The patient was brought to OR room 1 and  underwent a general  anesthetic.  After adequate level of anesthesia was  obtained, the patient was placed in the right lateral decubitus position  with the left side up and the left lower extremity was prepped using  DuraPrep and draped in a sterile field.  Dr. Johna Sheriff was working on the  hip and I was working on the leg.  A lateral incision was made from the  knee distally.  Fasciotomies were performed of the anterior and lateral  compartments.  There were some mottled changes to the muscle.  The calf  was soft, however, there was mottled necrotic muscle changes and this  was debrided with a rongeur.  The fascia was released in both the  anterior and lateral compartments.  There was no necrotic fascia but as  mentioned there was some nonviable muscle.  This was debrided.  There  was a clear fluid surrounding the fascia.  There was no evidence of any  purulence or abscess.  Cultures were obtained x2.  The wound was  irrigated with pulsatile lavage and  a large Hemovac was placed and set  at negative 75 mmHg suction.  This had a good suction fit.  After Dr.  Johna Sheriff finished with his debridement, his wound was wicked open and  the patient was then taken to the ICU in stable condition intubated.  Plan for monitoring the patient and ICU treatment for his sepsis.      Nadara Mustard, MD  Electronically Signed     MVD/MEDQ  D:  04/15/2009  T:  04/16/2009  Job:  [id number]

## 2011-01-19 NOTE — H&P (Signed)
NAME:  Jeffrey [name], Jeffrey [name] NO.:  [id number]   MEDICAL RECORD NO.:  [id number]          PATIENT TYPE:  EMS   LOCATION:  ED                           FACILITY:  [facility]   PHYSICIAN:  Michaelyn Barter, M.D. DATE OF BIRTH:  1956-08-26   DATE OF ADMISSION:  07/05/2007  DATE OF DISCHARGE:                              HISTORY & PHYSICAL   PRIMARY CARE PHYSICIAN:  Unassigned.   CHIEF COMPLAINT:  Infected left foot.   HISTORY OF PRESENT ILLNESS:  [name] is a 55 year old gentleman  who indicates that his left foot has been hurting for the last 3 days.  He states that since his foot started hurting, it has begun to swell and  has become progressively more painful.  He indicates that the swelling  initially started around his ankle but since that time has traveled to  just below his knee.  He denies having any trauma to his left lower  extremity and he has never had similar symptoms before.  He has  experienced chills over the past couple of days but denies having any  fevers.  There is no complaint of nausea or emesis.   PAST MEDICAL HISTORY:  1. Gout approximately 6 years ago involving the right big toe.  2. Anxiety,   PAST SURGICAL HISTORY:  1. Appendectomy.  2. Torn ligament within the left ankle.   ALLERGIES:  None.   HOME MEDICATIONS:  Xanax 0.5 mg p.o. daily   SOCIAL HISTORY:  Cigarettes:  The patient smokes 1.5 to 2 packs per day,  started smoking at the age of 72.  Alcohol:  To drinks 1-2 beers per day.  Street drugs:  The patient denies.   FAMILY HISTORY:  Mother had bilateral leg amputations secondary to blood  clots.  Father was killed in a fire.   REVIEW OF SYSTEMS:  As per HPI, otherwise all other systems are  negative.   PHYSICAL EXAMINATION:  GENERAL:  The patient looks ill.  He is  shivering.  VITALS:  Temperature 98.4, blood pressure 142/85, heart rate 87,  respirations 16, O2 saturation is 97% on room air.  HEENT:  Normocephalic and  atraumatic, anicteric.  Extraocular movements  are intact.  Pupils are equally reactive to light.  Oral mucosa is pink,  no thrush, no exudates.  NECK:  Supple, no JVD, no lymphadenopathy, no thyromegaly.  CARDIAC:  S1, S2 present, regular rate and rhythm.  No murmurs, rubs or  gallops.  LUNGS:  No crackles or wheezes.  ABDOMEN:  Flat, soft, nontender, nondistended.  Positive bowel sounds x4  quadrants, no masses palpated.  No hepatosplenomegaly.  EXTREMITIES:  Left leg is swollen from the toes up to the distal region  of is knee.  The dorsum of the patient's foot appears to be  erythematous.  It is warm to palpation, small petechial types of  erythematous areas are evident.  No obvious skin breakdown suggestive of  recent trauma.  Pulses are difficult to appreciate secondary to the  swelling and also secondary to the tenderness that the patient complains  of when his foot is touched.  MUSCULOSKELETAL:  5/5 Upper and lower extremity strength.   X-rays:  The patient had x-rays of his left foot completed on July 04, 2007 which revealed negative for fracture or other acute bony  injury, mild degenerative and presumed old post-traumatic changes were  noted.  Small spurs from the calcaneus and medial malleolus was seen.  Normal alignment.   ASSESSMENT AND PLAN:  1. Left foot/leg cellulitis.  We will start the patient on empiric IV      antibiotics for now.  We will cover the patient broadly for now.      We will check the patient's blood cultures x2.  We will also order      venous duplex of the patient's left lower extremity to rule out the      presence of a DVT and also we will order ABIs of the patient's left      leg secondary to difficulties palpating his pulse.  We will provide      the patient with p.r.n. pain medications and we will also order a      serum uric acid level in light of the patient complaining of a      history of gout.  2. GI prophylaxis:  We will provide  Protonix.  3. DVT prophylaxis:  We will provide Lovenox.      Michaelyn Barter, M.D.  Electronically Signed     OR/MEDQ  D:  07/05/2007  T:  07/05/2007  Job:  [id number]
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
## 2011-01-19 NOTE — Op Note (Signed)
NAME:  Jeffrey [name], Jeffrey [name]              ACCOUNT NO.:  [id number]   MEDICAL RECORD NO.:  [id number]          PATIENT TYPE:  INP   LOCATION:  1236                         [facility]AC[facility]:  [facility]   PHYSICIAN:  Sharlet Salina T. Hoxworth, M.D.DATE OF BIRTH:  03-03-1956   DATE OF PROCEDURE:  04/15/2009  DATE OF DISCHARGE:                               OPERATIVE REPORT   PREOPERATIVE DIAGNOSES:  Sepsis and soft tissue infection left hip,  buttock and left lower extremity.   POSTOPERATIVE DIAGNOSES:  Sepsis and soft tissue infection left hip,  buttock and left lower extremity.   SURGICAL PROCEDURES:  Incision, drainage and fasciotomy left hip and  buttock and left lower extremity.   SURGEONS:  1. Dr. Johna Sheriff.  2. Dr. Lajoyce Corners   ANESTHESIA:  General.   BRIEF HISTORY:  The patient is a 55 year old male with history of  alcoholism who was brought in by EMS from the homeless shelter with very  vague history.  He has apparently been having a lot of leg pain.  He was  found to have evidence of sepsis with high fever, delirium,  myoglobinuria, white count of 18,000, and physical exam showing a large  area of erythema, some skin blistering and ecchymosis over the left hip  and buttock and another similar area in the left lower extremity below  the knee.  There is clinical evidence for significant necrotizing soft  tissue infection.  After consulting with Dr. Lajoyce Corners and examination in the  emergency room, we have elected to bring the patient to the operating  room for incision, drainage and debridement.   DESCRIPTION OF PROCEDURE:  The patient was brought to the operating room  and placed in the supine position on the operating table and general  endotracheal anesthesia was induced.  He was then carefully positioned  in the right lateral decubitus position and the entire left hip, buttock  and extremity sterilely prepped and draped.  I then proceeded with  incision and drainage of the left hip and buttock area  while Dr. Lajoyce Corners  proceeded with the procedure on the left leg as he will separately  dictate.  In the hip and buttock a transverse incision was made over the  center of the area of erythema and blistering and dissection was carried  down into the subcutaneous tissue with cautery.  The dissection was  deepened down to the level of the fascia which was incised and the  underlying muscle exposed.  There was a lot of edema in the tissue, but  on careful exploration there was no evidence of necrosis nor obvious  purulent material.  The wound was thoroughly irrigated.  Moist saline  gauze packing was applied and a dry sterile dressing applied.  The  patient was taken to the ICU, intubated in critical but stable  condition.      Lorne Skeens. Hoxworth, M.D.  Electronically Signed     BTH/MEDQ  D:  04/16/2009  T:  04/16/2009  Job:  [id number]

## 2011-01-19 NOTE — Consult Note (Signed)
NAME:  AWAB, ABEBE NO.:  [id number]   MEDICAL RECORD NO.:  [id number]          PATIENT TYPE:  INP   LOCATION:  1501                         FACILITY:  [facility]   PHYSICIAN:  Antonietta Breach, M.D.  DATE OF BIRTH:  Mar 22, 1956   DATE OF CONSULTATION:  09/18/2007  DATE OF DISCHARGE:  09/25/2007                                 CONSULTATION   Mr. Veals continues with severe confusion and agitation.  He  required 5 mg of Haldol twice today along with 4 mg Ativan.   PHYSICAL EXAMINATION:  VITAL SIGNS:  Temperature 98.0, pulse 91,  respiratory rate 18, blood pressure 150/90, O2 saturation on room air  95%.   MENTAL STATUS EXAM:  Mr. Vineyard is agitated.  Thought process  disorganized.   ASSESSMENT:  293.00, delirium not otherwise specified.   RECOMMENDATIONS:  Would utilize Zyprexa 5 mg p.o. q.h.s.  Would increase  as tolerated to 10 mg q.h.s.      Antonietta Breach, M.D.  Electronically Signed     JW/MEDQ  D:  01/19/2008  T:  01/19/2008  Job:  [id number]
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
## 2011-01-19 NOTE — Discharge Summary (Signed)
NAME:  Jeffrey [name], Jeffrey [name]              ACCOUNT NO.:  [id number]   MEDICAL RECORD NO.:  [id number]          PATIENT TYPE:  INP   LOCATION:  1509                         FACILITY:  [facility]   PHYSICIAN:  Ladell Pier, M.D.   DATE OF BIRTH:  [birth year]   DATE OF ADMISSION:  09/05/2007  DATE OF DISCHARGE:                               DISCHARGE SUMMARY   ADDENDUM TO DISCHARGE SUMMARY:  Addendum to discharge summary done by  Dr. Lavera Guise.   This is from the events from the events of September 19, 2007.   PROBLEMS:  1. Delirium tremens:  The patient continued to have DTs.  His      medication was decreased to Librium twice a day.  His Zyprexa was      added per Dr. Jeanie Sewer.  The patient is more awake, but is still      confused at times.  Sometimes he is alert and oriented times three.      Will ease present inner restraints.  Will continue to monitor him.      He is on Zyprexa daily.  2. Hypokalemia.  The patient was also noted to be hypokalemic, and his      potassium is being repleted and magnesium level checked.  3. Blood loss anemia.  The patients' anemia has remained stable over      the time period.  Prior to discharge, GI doctor, Dr. Matthias Hughs should      be reconsulted when patient's DTs resolves so that he can get a      repeat upper endoscopy and colonoscopy.  4. Cellulitis.  His cellulitis has resolved.  Will discontinue his      antibiotics.  5. Elevated TSH.  His TSH was checked secondary to his continued      delirium.  His TSH is elevated.  Will check a free T4 to see if he      is hypothyroid or if he just has subclinical hypothyroidism.  In      either case, he will need replacement.  6. Gout.  No symptoms of gout.  Will gradually taper steroids.  7. GI bleed as mentioned above.  When DTs resolve, will consult GI.      Ladell Pier, M.D.  Electronically Signed     NJ/MEDQ  D:  09/19/2007  T:  09/19/2007  Job:  [id number]
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
## 2011-01-19 NOTE — Op Note (Signed)
NAME:  Jeffrey [name], Jeffrey [name]              ACCOUNT NO.:  [id number]   MEDICAL RECORD NO.:  [id number]          PATIENT TYPE:  INP   LOCATION:  1340                         FACILITY:  [facility]   PHYSICIAN:  Nadara Mustard, MD     DATE OF BIRTH:  October 31, 1955   DATE OF PROCEDURE:  05/02/2009  DATE OF DISCHARGE:                               OPERATIVE REPORT   PREOPERATIVE DIAGNOSIS:  Open wound left hip and left leg, status post  debridement for necrotizing fasciitis.   POSTOPERATIVE DIAGNOSES:  1. Nonviable anterior compartment, left leg.  2. Open wounds, 20 cm in length x2.   PROCEDURE:  1. Irrigation and debridement skin, soft tissue and bone.  2. Excise entire anterior compartment, left leg.  3. Secondary closure of traumatic wounds, complex closure, each 20 cm      in length.   SURGEON:  Nadara Mustard, M.D.   ANESTHESIA:  General.   ESTIMATED BLOOD LOSS:  Minimal.   ANTIBIOTICS:  Kefzol 1 gram.   DRAINS:  None.   COMPLICATIONS:  None.   DISPOSITION:  To PACU in stable condition.   PROCEDURE:  The patient is a 55 year old gentleman who presented with a  necrotizing fasciitis.  He underwent emergent irrigation and debridement  of both wounds.  He was later treated with wound V.A.C.  The patient had  essentially healed the wounds, had resolved the cellulitis and presented  at this time for repeat irrigation and debridement of wound closure.   DESCRIPTION OF PROCEDURE:  The patient was brought to the OR and  underwent general anesthetic.  After adequate level of anesthesia  obtained, the patient placed on the right lateral decubitus position  with the left side up.  The left lower extremity  was prepped using  DuraPrep and draped into a sterile field.  Initially all the soft tissue  and wound edges, approximately 0.5 cm , around the wound edges was  excised and one block of tissue.  The wounds were irrigated.  The  proximal wound had good healthy viable tissue and the wound was  closed  using a far-near-near-far suture technique with 2-0 nylon.  On  examination of the leg, there was still necrotic muscle in the anterior  compartment.  After successful debridement, the patient essentially lost  his entire anterior compartment due to nonviable noncontractile poor  color, poor consistency of the muscle.  This was completely resected.  The  entire  anterior compartment of the wound was then further irrigated, debrided  and the wound was closed with a complex closure of the leg wound.  Both  legs were covered with sterile dressing.  Mepilex was applied to the hip  and 4x4s plus Kerlix and Coban were applied to the hip.  The patient was  extubated, taken to PACU in stable condition.      Nadara Mustard, MD  Electronically Signed     MVD/MEDQ  D:  05/02/2009  T:  05/03/2009  Job:  [phone]

## 2011-01-19 NOTE — Discharge Summary (Signed)
NAME:  Jeffrey [name], BALDERSON NO.:  [id number]   MEDICAL RECORD NO.:  [id number]          PATIENT TYPE:  IPS   LOCATION:  0300                          [facility]ACILITY:  [facility]   PHYSICIAN:  Anselm Jungling, MD  DATE OF BIRTH:  1956/07/18   DATE OF ADMISSION:  01/02/2009  DATE OF DISCHARGE:  01/08/2009                               DISCHARGE SUMMARY   IDENTIFYING DATA/REASON FOR ADMISSION:  This was on of several [facility]  admissions for Jeffrey [name], a 55 year old single white male who returned to [place]  within 48 hours after his most recent discharge.  He came back to [place]  with a history of chronic alcohol dependence, and substance-induced mood  disorder, and opiate dependence.  Please refer to the admission note for  further details pertaining to the symptoms, circumstances and history  that led to his hospitalization.  He was given initial Axis I diagnosis  of alcohol dependence, and opiate dependence.   MEDICAL/LABORATORY:  The patient was medically and physically assessed  by the psychiatric nurse practitioner.  He came to [place] with a long  medical history including chronic back pain, rule out hypothyroidism,  status post left ankle sprain, normocytic anemia, thrombocytopenia,  alcohol induced hepatitis, and scattered skin lesions NOS.  He was  continued on his usual non-psychotropic medications including Protonix,  Tenormin, and Synthroid.  There were no acute medical issues during his  stay.   HOSPITAL COURSE:  The patient was admitted to the adult inpatient  psychiatric service.  He presented as a tall, large, but ill-appearing  gentleman who indicated that he did not stay in the placement that we  sent him to because people were drinking there.  Our sense was that the  patient preferred living in our particular inpatient unit, even though  we had previously made it clear to him that it was not a long-term  facility.  He was moderately irritable, but became more pleasant if we  indicated that we were providing what he was looking for.   During this admission, as in the previous ones, he minimized treatment  issues and substance abuse issues.  He evidenced a sense of entitlement,  and was not very resourceful in helping to develop a plan for himself  following discharge.  He did agree to the following aftercare plan.   AFTERCARE:  The patient was to follow-up with [facility] mental  health with an appointment to see their psychiatrist on Jan 23, 2009 at  8:30 a.m.  He was also to contact [facility] for substance abuse  treatment intake appointment.   DISCHARGE MEDICATIONS:  1. Synthroid 50 mcg daily.  2. Protonix 40 mg daily.  3. Multivitamin with iron 1 tablet daily.  4. Trazodone 50 mg q.h.s.  5. Tenormin 25 mg daily.   The patient was advised to see his medical doctor for pain medication  issues.  It should be noted that the TSH level on discharge was 7.3,  with a T4 of 0.70.  He was given an appointment to be seen at  [facility] on February 04, 2009 at 2:45 p.m.  for follow-up of his medical  issues.  He was instructed to stop taking Ativan and Xanax.   DISCHARGE DIAGNOSES:  AXIS I: Polysubstance dependence.  AXIS II: Deferred.  AXIS III: History of hypothyroidism, hypertension, GERD.  AXIS IV: Stressors severe.  AXIS V: GAF on discharge 45.      Anselm Jungling, MD  Electronically Signed     SPB/MEDQ  D:  01/23/2009  T:  01/23/2009  Job:  [id number]

## 2011-01-19 NOTE — Consult Note (Signed)
NAME:  Jeffrey [name], Jeffrey [name] NO.:  [id number]   MEDICAL RECORD NO.:  [id number]          PATIENT TYPE:  INP   LOCATION:  1501                         FACILITY:  [facility]   PHYSICIAN:  Antonietta Breach, M.D.  DATE OF BIRTH:  04/28/1956   DATE OF CONSULTATION:  09/11/2007  DATE OF DISCHARGE:  09/25/2007                                 CONSULTATION   REASON FOR CONSULTATION:  Mental status changes, assesscapacity.   HISTORY OF PRESENT ILLNESS:  Jeffrey [name] is a 55 year old male who has  been requesting to leave the hospital against medical advice.   The patient has displayed stupor.  This was seen on his initial  assessment at the time of admission.   The patient has been seen multiple times in the emergency room and has  not accepted the care recommended.  He eventually was admitted for his  cellulitis in the left ankle along with a gout flare.   Now the patient has been threatening to leave the hospital.   MENTAL STATUS EXAM:  The patient clearly has decreased judgment.  He has  disorganized thought process.  He has poor insight.  He cannot  appreciate the morbidity and mortality risk of his medical problems.   ASSESSMENT:  Unspecified persistent mental disorder, not otherwise  specified.  Jeffrey [name] clearly demonstrates impaired reasoning and  impaired appreciation for his risk morbidity and mortality.  He does not  have the capacity for informed consent.  He does not have the capacity  to leave the hospital against medical advice.      Antonietta Breach, M.D.  Electronically Signed     JW/MEDQ  D:  01/13/2008  T:  01/13/2008  Job:  [id number]   cc:   Lonia Blood, M.D.

## 2011-01-19 NOTE — Consult Note (Signed)
NAME:  TOBY, BREITHAUPT NO.:  [id number]   MEDICAL RECORD NO.:  [id number]          PATIENT TYPE:  EMS   LOCATION:  ED                           FACILITY:  [facility]   PHYSICIAN:  Antonietta Breach, M.D.  DATE OF BIRTH:  [birth year]   DATE OF CONSULTATION:  02/04/2009  DATE OF DISCHARGE:                                 CONSULTATION   HISTORY:  Mr. Prosser continues with high levels of anxiety.  He is  pacing the floor often.  He has irritability, agitation, anxiety.  He  does have a history of addiction.  He does have intact orientation and  memory function.   PHYSICAL EXAMINATION:  VITAL SIGNS:  Temperature 97.5, pulse 78,  respiratory rate 20, blood pressure 126/80.   MENTAL STATUS EXAM:  Mr. Treadway is alert.  His eye contact is  intermittent.  His affect is very anxious and irritable.  Mood is  irritable.  He is oriented to all spheres.  He is not violent and is  redirectable.  His memory is intact.  His speech is normal.  Thought  process is coherent. Thought content, he acknowledges suicidal intent.  He has thoughts of hopelessness and helplessness.  His insight is  partial.  Judgment is impaired.   ASSESSMENT:  293.83, mood disorder not otherwise specified, depressed.  Major depressive disorder.  293.84, anxiety disorder not otherwise specified.   RECOMMENDATIONS:  1. Would admit to an inpatient psychiatric unit.  2. Would continue the Celexa 10 mg q.a.m. for today and then increase      tomorrow to 20 mg q.a.m. as tolerated for antidepression and      antianxiety.  3. Would continue Vistaril.  He has not received his current Vistaril.      The Vistaril is for acute anti-anxiety and anti-insomnia at 25-75      mg q.i.d. p.r.n.  4. For any severe catastrophic level of anxiety or agitation, would      utilize Zyprexa 10 mg p.o. or IM b.i.d. p.r.n. because the patient      does have a history of addiction and we would like to avoid      benzodiazepines at  this point.  5. Would utilize Zyprexa 10 mg IM b.i.d. p.r.n. severe agitation.  6. We will defer any standing anti-psychotic at this time until      further evaluation and treatment on a psychiatric unit and will      utilize the p.r.n. antipsychotic for agitation.      Antonietta Breach, M.D.  Electronically Signed     JW/MEDQ  D:  02/04/2009  T:  02/04/2009  Job:  [id number]

## 2011-01-19 NOTE — Consult Note (Signed)
NAME:  Jeffrey [name], Jeffrey [name] NO.:  [id number]   MEDICAL RECORD NO.:  [id number]          PATIENT TYPE:  INP   LOCATION:  1425                         FACILITY:  [facility]   PHYSICIAN:  Bernette Redbird, M.D.   DATE OF BIRTH:  07/02/1956   DATE OF CONSULTATION:  09/06/2007  DATE OF DISCHARGE:                                 CONSULTATION   Dr. Karilyn Cota of the [facility] hospitalists asked [place] to see this 55-year-  old unassigned patient because of GI bleeding.   Jakari is an alcoholic applying for disability, unemployed, lives with a  roommate, admitted to the hospital earlier today because of cellulitis  of the left ankle.  His labs came back showing profound anemia which had  developed overnight, hemoglobin 7.5 versus 10.4 in the emergency room  yesterday.  Retrospectively, he admitted to several days of dark stools,  although he has no prior history of ulcer disease, GI bleeding or NSAID  exposure. No history of regular aspirin use but recently was given  indomethacin for an apparent gout attack.  He has been hemodynamically  stable.   The anemia is macrocytic, with an MCV of 102. There is a history of  excessive ethanol consumption but no prior hospitalizations for alcohol-  related medical problems.   PAST MEDICAL HISTORY:  No known medication allergies.   OUTPATIENT MEDICATIONS:  Percocet and Xanax.   OPERATIONS:  Remote appendectomy and ankle surgery.   MEDICAL ILLNESSES:  Left foot cellulitis in October 2008, also history  of gout.   HABITS:  He smokes about one-half packs per day and drinks 1-2 six packs  a day or whatever it takes.   FAMILY HISTORY:  Negative for GI illnesses.   SOCIAL HISTORY:  The patient lives with a roommate.  He is applying for  disability.  He is not currently employed.   REVIEW OF SYSTEMS:  No ongoing GI tract symptomatology in either the  upper or lower GI tract such as anorexia, weight loss, dysphagia,  heartburn, constipation,  diarrhea or rectal bleeding as far as I can  tell.  Note that he does gag each morning until he drinks alcohol.   PHYSICAL EXAM:  VITAL SIGNS:  Temperature 98.6, blood pressure 189/96,  pulse 85, respirations 20.  He is anicteric.  He has moderate pallor.  No obvious stigmata of  chronic liver disease, did not see palmar erythema, spider angiomata,  scleral icterus.  The liver does feel somewhat indistinctly enlarged but there is no  obvious ascites or splenomegaly.  No lower extremity edema except in the left ankle.  CHEST:  Clear.  HEART:  Normal.  ABDOMEN:  Without tenderness.   LABORATORY DATA:  Current hemoglobin 7.5, down from 10.4 yesterday, MCV  102, white count 8200 with differential yesterday showing 56 polys, 30  lymphs, 13 monocytes, platelets 90,000.  Sed rate greater than 140.  Chemistry panel did not include any liver chemistries but the patient  does have a normal BUN of 16.   IMPRESSION:  Anemia with overnight drop in hemoglobin and recent black  stools   PLAN:  Endoscopic  evaluation today. The nature, purpose and risks were  reviewed.  I will check a rectal exam to see the current status of his  stool.  Further management will depend on the endoscopic findings.  Soon, the patient will be of age for screening colonoscopy but  realistically, it is unlikely he would comply with that as an outpatient  and he will probably be too unstable as an inpatient to put him through  it, at least for the time being.           ______________________________  Bernette Redbird, M.D.     RB/MEDQ  D:  09/06/2007  T:  09/06/2007  Job:  [id number]   cc:   Dr. Stefanie Libel, [place].
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
## 2011-01-19 NOTE — Op Note (Signed)
NAME:  Jeffrey [name], Jeffrey [name]              ACCOUNT NO.:  [id number]   MEDICAL RECORD NO.:  [id number]          PATIENT TYPE:  INP   LOCATION:  1340                         FACILITY:  [facility]   PHYSICIAN:  Nadara Mustard, MD     DATE OF BIRTH:  March 23, 1956   DATE OF PROCEDURE:  05/05/2009  DATE OF DISCHARGE:                               OPERATIVE REPORT   PREOPERATIVE DIAGNOSIS:  Hematoma infection, left hip wound.   POSTOPERATIVE DIAGNOSIS:  Hematoma infection, left hip wound.   PROCEDURE:  Open hip wound irrigation, debridement, and closure of hip  wound over a large Penrose drain.   SURGEON:  Nadara Mustard, MD   ANESTHESIA:  General.   ESTIMATED BLOOD LOSS:  Minimal.   ANTIBIOTICS:  Kefzol 1 g.   DRAINS:  One Penrose drain.   COMPLICATIONS:  None.   DISPOSITION:  To PACU in stable condition.   INDICATIONS FOR PROCEDURE:  The patient is a 55 year old gentleman who  is status post multiple irrigation and debridements for infection in the  left hip and left calf.  Over the weekend, the patient had increasing  bleeding from the hip wound.  The patient was also found to be  manipulating the wound and removing the dressing.  Due to the large  blood loss, the hematoma beneath the incision, and potential for further  infection due to manipulation of the wound, the patient returns at this  time for repeat irrigation and debridement of left hip wound.  Risks and  benefits were discussed including persistent infection, potential loss  of limb, need for additional surgery.  The patient states he understands  and wished to proceed at this time.   DESCRIPTION OF PROCEDURE:  The patient was brought to the OR and  underwent a general anesthetic.  After adequate level of anesthesia was  obtained, the patient was placed in the right lateral decubitus position  with the left side up and the left lower extremity was prepped using  DuraPrep and draped into a sterile field.  The leg was draped out  into a  impervious stockinette.  The leg incision was changed prior to prepping,  and there was some mild ischemic changes along the wound edges, but no  evidence of hematoma.  No evidence of any drainage other than a mild  serosanguineous drainage.  The sutures were removed and a large hematoma  was within the wound.  This was removed.  The wound was irrigated with  pulsatile lavage.  All hematoma was debrided and removed.  Deep cultures  were obtained prior to a pulsatile lavage.  Visualization showed there  to be no active bleeding.  There was no oozing from the skin edges.  There was no evidence of healing.  The patient's nutritional status with  an albumin of 1.4 and his multiple medical problems appeared to severely  impacting his ability to heal this wound.  After pulsatile lavage and  irrigation and cleansing, the wound was closed using a 2-0 nylon with a  far-near-near-far suture.  There was no  tension on the skin edges.  A  large Penrose drain was placed deep within  the wound to evacuate any serosanguineous drainage.  The wound was  covered with Adaptic, orthopedic sponges, ABD dressing, and Hypafix  tape.  The patient was extubated, taken to PACU in stable condition.  Continue IV Kefzol.      Nadara Mustard, MD  Electronically Signed     MVD/MEDQ  D:  05/05/2009  T:  05/06/2009  Job:  [id number]
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
## 2011-01-19 NOTE — Discharge Summary (Signed)
NAME:  Jeffrey [name], Jeffrey [name]              ACCOUNT NO.:  [id number]   MEDICAL RECORD NO.:  [id number]          PATIENT TYPE:  INP   LOCATION:  1228                         FACILITY:  [facility]   PHYSICIAN:  Lonia Blood, M.D.       DATE OF BIRTH:  03/21/1956   DATE OF ADMISSION:  09/05/2007  DATE OF DISCHARGE:                               DISCHARGE SUMMARY   DISCHARGE DIAGNOSES:  1. Left ankle cellulitis.  2. Gout.  3. Upper gastrointestinal bleeding most likely secondary to gastritis      - resolved.  4. Delirium tremens.  5. Acute blood loss anemia.  6. 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  7. Morbid obesity.   Medications and condition at discharge will be dictated at the time of  the actual discharge of the patient.   PROCEDURES DURING THIS ADMISSION:  1. September 06, 2007 an upper endoscopy done by Dr. Matthias Hughs. Findings      of normal appearing esophagus.  No gastritis or erosions, ulcers,      masses or polyps. A little bit of blotchy erythema in the duodenum.  2. September 06, 2007 an MRI of the left lower extremity.  Findings of      extensive subcutaneous edema through the ankle, dorsum of the foot,      edema in the distal tibia and talus,  lateral fluid collection in      the ankle likely representing a ganglionic cyst, tenosynovitis of      the common peroneus tendon sheath, flexor digitalis longus and      flexor hallucis longus.   CONSULTATIONS DURING THIS ADMISSION:  Dr. Matthias Hughs from gastroenterology  and Dr. Merlyn Albert from psychiatry.   HISTORY AND PHYSICAL:  For admission history and physical refer dictated  H&P done by Dr. Gasper Sells September 05, 2007.   HOSPITAL COURSE:  1. Cellulitis of the left ankle.  Jeffrey [name] was admitted to [facility] with cellulitis of the left ankle.  An MRI of the      ankle did not reveal any trauma, fracture or osteomyelitis.  No      abscess was identified.  The patient had two blood cultures which      were  negative.  Jeffrey [name] was treated with intravenous      antibiotics in the form of vancomycin and Zosyn and he was also      treated with prednisone for possibility of gout.  The patient's      pain in the left lower extremity has improved significantly and his      erythema is decreasing by the day.  Further treatments on the      current cellulitis will be undertaken by the new attending      physician for this patient by Dr. Olena [name].  2. Episode of emesis.  Upon admission on September 05, 2007 the patient      was noted to vomit coffee-ground emesis.  He was seen emergently by      Dr. Bernette Redbird from gastroenterology who performed an upper  endoscopy which did not reveal presence of any pathological      findings to justify the presence of hematemesis.  In fact, Mr.      [name] was observed now for 8 days and he did not and have any      recurrence of his hematemesis.  The patient was treated with proton      pump inhibitor intravenously and he has remained stable.  3. Delirium tremens starting October 09, 2007.  The patient has become      extremely agitated.  He has remained confused and at times he has      seen spiders attacking him. He was kept on an Ativan drip and then      he was transitioned to Ativan p.r.n.  This though has failed since      and the patient had to be transitioned back to an Ativan drip and      moved back into the intensive care unit.  On September 11, 1007 the      patient was seen by Dr. Drema Dallas from psychiatry who      concurred with the ongoing medical treatment and commitment papers      for the patient.  Jeffrey [name] is currently unable to manage and      live on his own due to his ongoing delirium and his situation has      been discussed at length with his friend and only significant other      Jeffrey [name].      Lonia Blood, M.D.  Electronically Signed     SL/MEDQ  D:  09/13/2007  T:  09/14/2007  Job:  [id number]   cc:   [facility]

## 2011-01-19 NOTE — H&P (Signed)
NAME:  Jeffrey [name], Jeffrey [name]              ACCOUNT NO.:  [id number]   MEDICAL RECORD NO.:  [id number]          PATIENT TYPE:  INP   LOCATION:  1425                         FACILITY:  [facility]   PHYSICIAN:  Thomasenia Bottoms, MDDATE OF BIRTH:  11-18-1955   DATE OF ADMISSION:  09/05/2007  DATE OF DISCHARGE:                              HISTORY & PHYSICAL   CHIEF COMPLAINT:  Painful left ankle.   HISTORY OF PRESENT ILLNESS:  Please note all of my history comes from  the emergency department physician.  The patient has been sedated and is  unable to provide any history, but briefly, this is a 55 year old who  presented tonight with a painful swollen left ankle.  He was seen  yesterday in the emergency department for the same.  The doctors wanted  to admit him for cellulitis at that time, but the patient refused to be  admitted, said he would take p.o. antibiotics.  Today, he comes back in  also because of the pain.  He has not gotten his antibiotic prescription  filled.  Of note, the patient was seen 5 days ago for the same thing.  At that time, he was diagnosed with a gout flare and given a  prescription for indomethacin.  The patient does have the indomethacin  bottle with him.   The patient's past medical history from the medical records is  significant for cellulitis of the same foot, left foot, in October 2008.  He was admitted for that.  He has a history of gout.   SOCIAL HISTORY:  Per the computer, it appears he drinks 1-2 beers every  day.  It is also noted that he has been known to sell his prescription  pain pills in the past.   Medications listed previously included Xanax 0.5 mg daily, but at this  time, all he has is indomethacin 75 mg p.o. b.i.d.   FAMILY HISTORY:  The patient is not able to provide.   REVIEW OF SYSTEMS:  He is not able to provide.   In the emergency department, his temperature is 99.2, blood pressure  144/88, pulse 111, respiratory rate 15.  O2 sats 93% to  100% on room  air.   PHYSICAL EXAMINATION:  GENERAL:  The patient is quite disheveled, smells  strongly of cigarettes, and is snoring and sedated.  HEENT EXAM:  Normocephalic, atraumatic.  His pupils are nearly pinpoint  but reactive somewhat.  Sclera nonicteric.  Oral mucosa quite dry.  NECK:  Supple.  No lymphadenopathy.  No thyromegaly.  No jugular venous  distention.  CARDIAC EXAM:  Regular rate and rhythm with no murmurs, gallops, or  rubs.  LUNGS:  Clear to auscultation bilaterally with no wheezes, rhonchi, or  rales.  ABDOMEN:  Obese, nontender.  He does have bowel sounds.  Nondistended.  No masses were appreciated.  EXTREMITIES:  His right lower extremity and left lower extremity reveal  DP pulses, which are palpable.  The right lower extremity has no  swelling and is of normal color.  The left lower extremity reveals  significant swelling noted in the medial and lateral  surface of the  ankle.  It is almost as if he has a small lemon in his ankle, which is  protruding.  It is quite erythematous.  There is some swelling beyond  the ankle joint into his midfoot.  The erythema and heat are quite focal  over the ankle joint.  The patient is able to wiggle his toes, but it is  painful when he moves the joint significantly.  SKIN:  Reveals multiple scratches and healing abrasions.  NEUROLOGICALLY:  He is snoring.  He received Ativan and Dilaudid prior  to my arrival.  He will wake up briefly to answer a simple question but  goes right back to sleep.  At times, when he tries to answer, he just  whispers and then goes back to sleep.   DATA:  White count 7.4, hemoglobin 10.4, hematocrit 29.7, platelet count  is low at 82.  His MCV is elevated at 101.3.  Sodium is 136, potassium  3.9, chloride 105, bicarb 22, glucose 106, BUN 18, creatinine 0.79.  Uric acid is high at 7.1.   ASSESSMENT/PLAN:  1. Swollen left ankle:  I suspect this is a gout flare, perhaps, with      some  cellulitis.  We will also need to be sure this is only gout      and that he does not have a septic joint.  The plan is to admit him      to the hospital.  We will continue his indomethacin and start him      on colchicine as well.  We will start empiric vancomycin and will      get an MRI of the left ankle in a few hours when the MRI is      available.  It has been recommended that the MRI may be able to      reveal signs of infection.  Certainly, if it is not able to, we      will get an orthopedic consultation right away.  Also, it will be      important to reevaluate to see if the treatment of gout helps to      reduce the swelling and redness in the ankle as well.  The patient      has a low-grade fever of 99, no elevated white count, but he does      have a scar over that ankle, and it is clear that he has had some      surgery previously.  2. Tobacco abuse:  We will counsel the patient to quit once he is no      longer sedated and able to hear the counseling.  3. Possible alcohol abuse:  Will write him for p.r.n. Ativan and      monitor him carefully.  4. It is not clear whether or not the patient abuses narcotics or any      other substances.  We will have to discuss this with him once he is      awake.  Of note, the patient does have an elevated MCV and      thrombocytopenia, which would go along with alcohol abuse, so we      will monitor for this as well.      Thomasenia Bottoms, MD  Electronically Signed     CVC/MEDQ  D:  09/06/2007  T:  09/06/2007  Job:  [phone]   cc:   Melvern Banker  Fax: [phone]
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
## 2011-01-21 ENCOUNTER — Emergency Department (HOSPITAL_COMMUNITY)
Admission: EM | Admit: 2011-01-21 | Discharge: 2011-01-21 | Disposition: A | Discharge status: Home / Self Care | Payer: Medicaid Other | Attending: Emergency Medicine | Admitting: Emergency Medicine

## 2011-01-21 DIAGNOSIS — I10 Essential (primary) hypertension: Secondary | ICD-10-CM | POA: Insufficient documentation

## 2011-01-21 DIAGNOSIS — Z79899 Other long term (current) drug therapy: Secondary | ICD-10-CM | POA: Insufficient documentation

## 2011-01-21 DIAGNOSIS — R319 Hematuria, unspecified: Secondary | ICD-10-CM | POA: Insufficient documentation

## 2011-01-21 DIAGNOSIS — Z8619 Personal history of other infectious and parasitic diseases: Secondary | ICD-10-CM | POA: Insufficient documentation

## 2011-01-21 DIAGNOSIS — Z046 Encounter for general psychiatric examination, requested by authority: Secondary | ICD-10-CM | POA: Insufficient documentation

## 2011-01-21 DIAGNOSIS — F319 Bipolar disorder, unspecified: Secondary | ICD-10-CM | POA: Insufficient documentation

## 2011-01-21 DIAGNOSIS — E039 Hypothyroidism, unspecified: Secondary | ICD-10-CM | POA: Insufficient documentation

## 2011-01-21 LAB — URINE MICROSCOPIC-ADD ON

## 2011-01-21 LAB — VALPROIC ACID LEVEL: Valproic Acid Lvl: <10 ug/mL — ABNORMAL LOW (ref 50.0–100.0)

## 2011-01-21 LAB — CBC
HCT: 47.6 % (ref 39.0–52.0)
Hemoglobin: 16.5 g/dL (ref 13.0–17.0)
WBC: 10.1 10*3/uL (ref 4.0–10.5)

## 2011-01-21 LAB — RAPID URINE DRUG SCREEN, HOSP PERFORMED
Barbiturates: NOT DETECTED
Cocaine: POSITIVE — AB
Opiates: NOT DETECTED

## 2011-01-21 LAB — DIFFERENTIAL
Basophils Absolute: 0.1 10*3/uL (ref 0.0–0.1)
Basophils Relative: 1 % (ref 0–1)
Eosinophils Absolute: 0.1 10*3/uL (ref 0.0–0.7)
Eosinophils Relative: 1 % (ref 0–5)
Lymphs Abs: 3.8 10*3/uL (ref 0.7–4.0)
Monocytes Absolute: 1 10*3/uL (ref 0.1–1.0)
Monocytes Relative: 10 % (ref 3–12)

## 2011-01-21 LAB — URINALYSIS, ROUTINE W REFLEX MICROSCOPIC
Bilirubin Urine: NEGATIVE
Glucose, UA: NEGATIVE mg/dL
Nitrite: NEGATIVE
Specific Gravity, Urine: 1.018 (ref 1.005–1.030)
pH: 5.5 (ref 5.0–8.0)

## 2011-01-21 LAB — BASIC METABOLIC PANEL
CO2: 25 mEq/L (ref 19–32)
Calcium: 9.2 mg/dL (ref 8.4–10.5)
Chloride: 96 mEq/L (ref 96–112)
GFR calc Af Amer: >60 mL/min (ref >60)
Sodium: 132 mEq/L — ABNORMAL LOW (ref 135–145)

## 2011-01-21 LAB — ETHANOL: Alcohol, Ethyl (B): <11 mg/dL — ABNORMAL HIGH (ref 0–10)

## 2011-01-22 ENCOUNTER — Emergency Department (HOSPITAL_COMMUNITY)
Admission: EM | Admit: 2011-01-22 | Discharge: 2011-01-28 | Disposition: A | Discharge status: Other Acute Inpatient Hospital | Payer: Medicaid Other | Attending: Emergency Medicine | Admitting: Emergency Medicine

## 2011-01-22 ENCOUNTER — Ambulatory Visit: Payer: Medicaid Other | Admitting: Family Medicine

## 2011-01-22 DIAGNOSIS — I10 Essential (primary) hypertension: Secondary | ICD-10-CM | POA: Insufficient documentation

## 2011-01-22 DIAGNOSIS — Z8619 Personal history of other infectious and parasitic diseases: Secondary | ICD-10-CM | POA: Insufficient documentation

## 2011-01-22 DIAGNOSIS — R45851 Suicidal ideations: Secondary | ICD-10-CM | POA: Insufficient documentation

## 2011-01-22 DIAGNOSIS — F3289 Other specified depressive episodes: Secondary | ICD-10-CM | POA: Insufficient documentation

## 2011-01-22 DIAGNOSIS — F329 Major depressive disorder, single episode, unspecified: Secondary | ICD-10-CM | POA: Insufficient documentation

## 2011-01-22 DIAGNOSIS — E039 Hypothyroidism, unspecified: Secondary | ICD-10-CM | POA: Insufficient documentation

## 2011-01-22 LAB — DIFFERENTIAL
Basophils Relative: 0 % (ref 0–1)
Eosinophils Absolute: 0.1 10*3/uL (ref 0.0–0.7)
Lymphs Abs: 3.1 10*3/uL (ref 0.7–4.0)
Monocytes Relative: 10 % (ref 3–12)
Neutro Abs: 5.5 10*3/uL (ref 1.7–7.7)
Neutrophils Relative %: 57 % (ref 43–77)

## 2011-01-22 LAB — CBC
Hemoglobin: 16.2 g/dL (ref 13.0–17.0)
MCH: 29.3 pg (ref 26.0–34.0)
Platelets: 203 10*3/uL (ref 150–400)
RBC: 5.52 MIL/uL (ref 4.22–5.81)
WBC: 9.6 10*3/uL (ref 4.0–10.5)

## 2011-01-22 LAB — COMPREHENSIVE METABOLIC PANEL
BUN: 19 mg/dL (ref 6–23)
CO2: 23 mEq/L (ref 19–32)
Calcium: 9.5 mg/dL (ref 8.4–10.5)
Creatinine, Ser: 0.69 mg/dL (ref 0.4–1.5)
GFR calc non Af Amer: >60 mL/min (ref >60)
Glucose, Bld: 91 mg/dL (ref 70–99)
Sodium: 138 mEq/L (ref 135–145)
Total Protein: 7.8 g/dL (ref 6.0–8.3)

## 2011-01-22 LAB — RAPID URINE DRUG SCREEN, HOSP PERFORMED
Amphetamines: NOT DETECTED
Barbiturates: NOT DETECTED
Benzodiazepines: NOT DETECTED
Cocaine: POSITIVE — AB

## 2011-01-22 LAB — ETHANOL: Alcohol, Ethyl (B): <11 mg/dL — ABNORMAL HIGH (ref 0–10)

## 2011-01-22 NOTE — H&P (Signed)
. [facility]  Patient:    Jeffrey [name], Jeffrey [name]                       MRN: [id number] Adm. Date:  [id number] Attending:  Cherylynn Ridges CC:         Iran Planas, M.D.   History and Physical  CHIEF COMPLAINT:  The patient is a 55 year old male with a abdominal pain, white count of 20,000 and likely acute appendicitis.  HISTORY OF PRESENT ILLNESS:  The patient has been sick for approximately 18-24 hours.  His last meal was attempted yesterday evening but he vomited that at approximately 8 p.m.  He subsequently did get oral contrast for a CT scan to diagnose acute appendicitis.  He had abdominal pain in sort of the mid lower abdominal area now localized to the right lower quadrant with rebound and guarding and a white count of 20,000.  It did not seem from the history that the patient required a CT scan, however, one was done which demonstrated acute appendicitis and no evidence of perforation.  PAST MEDICAL HISTORY:  Significant for no history coronary artery disease, renal disease, liver disease.  He is a 1-1/2 pack per day smoker and has smoked for at least 30 years.  FAMILY HISTORY:  He does have a family history of heart disease with several with coronary artery disease and bypasses.  MEDICATIONS:  He takes none.   He has a slight history of hypertension but is currently not taking medication.  ALLERGIES:  No known drug allergies.  REVIEW OF SYSTEMS:  He has no diarrhea, multiple episodes of vomiting, no fevers and chills.  He has had no dysuria, pyuria, no hematuria, no vomiting blood.  PHYSICAL EXAMINATION:  VITAL SIGNS:  He is afebrile with a blood pressure of 155/94.  Pulse is approximately 74.  He is normocephalic and atraumatic and anicteric.  NECK:  Supple.  CHEST:  Clear to auscultation.  CARDIAC:  Regular rate and rhythm.  He had no murmurs, no gallops, no lifts, no heaves.  ABDOMEN:  Mildly distended with hypoactive bowel  sounds.  Palpable tenderness in the right lower quadrant with voluntary and involuntary guarding.  He has a positive Rovsing sign.  RECTAL:  Per EDP demonstrated no palpable masses and was not very tender.  LABORATORY DATA:  White count of 20,000 with a left shift.  His hematocrit is 43%.  CT scan demonstrated acute appendicitis.  IMPRESSION:  Acute appendicitis.  PLAN:  Laparoscopic appendectomy, possible open appendectomy.  The risks and benefits of both procedures have been explained to the patient and he wishes to proceed.  This will be done as soon as possible when an OR is available and after the patient has received perioperative antibiotics. DD:  06/15/00 TD:  06/15/00 Job: [id number]

## 2011-01-22 NOTE — Op Note (Signed)
[place]  Patient:    Jeffrey [name], Jeffrey [name]                       MRN: [id number] Proc. Date: 06/15/00 Adm. Date:  [id number] Disc. Date: [id number] Attending:  Cherylynn Ridges                           Operative Report  PREOPERATIVE DIAGNOSIS:   Acute appendicitis.  POSTOPERATIVE DIAGNOSIS:  Acute appendicitis.  PROCEDURE:  Laparoscopic appendectomy.  SURGEON:  Jimmye Norman, M.D.  ASSISTANT:  Eugenia Pancoast, P.A.  ANESTHESIA:  General endotracheal.  ESTIMATED BLOOD LOSS:  Less than 50 cc.  COMPLICATIONS:  None.  CONDITION:  Stable.  SPECIMEN:  Gangrenous acutely inflamed appendix.  FINDINGS:  Acutely inflamed gangrenous appendix with a bowel perforation.  DISPOSITION:  To PACU and then to 5700 when stable.  INDICATIONS FOR OPERATION:  The patient is a 55 year old gentleman with abdominal pain localized to the right lower quadrant and now comes in for a laparoscopic appendectomy.  OPERATION:  The patient was taken to the operating room, placed on table in supine position.  After an adequate general anesthetic was administered he was prepped and draped in the usual sterile manner exposing the midline in the right lower quadrant of his abdomen.  The 11 blade was used to make a supraumbilical curvilinear incision down to the midline fascia.  As to do this ______  then a Verres needle was fastened to the perineal cavity while tenting up on the abdominal wall and using sharp towel clamps.  Once this was done we were able to confirm position of the Verres needle using the same ______.  Carbon dioxide insufflation was then instilled through the Verres needle into the peritoneal cavity up to a maximum intra-abdominal pressure of 15 mmHg.  Once we had done so up to a maximum pressure we inserted a 10-11 mm cannula and trocar through the supraumbilical fascia, then the peritoneal cavity.  With that in place we were able to pass the laparoscope  with attached camera and light source through the cannula, into the peritoneal cavity, confirming its intraperitoneal position.  We then placed the patient in more steep Trendelenburg position and the left side was tilted down.  A right subcostal 5 mm cannula and suprapubic 11-12 mm cannula passed into the peritoneal cavity under direct vision.  With both of these in place were were able to perform our dissection.  The appendix was noted at the base of the cecum and was gangrenous in its mid portion to the tip.  We were able to dissect out the base of the appendix from the cecum using a dissector and subsequently passed a 3.5 mm Endo GIA across the base stapling it off, and transecting it.  Once this was done the mesoappendix was taken between a 2.5 mm Endo GIA.   It was during this process I developed a loss of some of the control of the mesoappendix and regular Endo clips were used to attain hemostasis from the mesoappendix.  Once this was done we did have adequate hemostasis.  We removed the appendix from the suprapubic site using an Endo catch bag without contamination of the subcutaneous.  We irrigated with 2 liters of warm saline, there was no evidence of continued bleeding.  We allowed all gas to escape through the trocar sites.  We reapproximated the suprapubic catheter site and cannula  site using a figure-of-eight stitch of 0 Vicryl.  Once this was done we injected 0.25% Marcaine at all incision sites and then closed them using a running subcuticular stitch of 5-0 Vicryl.  Sterile dressings were applied including the Steri-Strips. DD:  06/15/00 TD:  06/16/00 Job: [id number]

## 2011-01-22 NOTE — Discharge Summary (Signed)
[place]  Patient:    Jeffrey [name], Jeffrey [name]                       MRN: [id number] Adm. Date:  [id number] Disc. Date: [id number] Attending:  Cherylynn Ridges                           Discharge Summary  DISCHARGE DIAGNOSIS:  Acute appendicitis with gangrenous changes without perforation.  PROCEDURE:  Laparoscopic appendectomy.  DISCHARGE MEDICATION:  Vicodin one to two every four hours p.r.n. for pain.  ACTIVITY:  He is to stay off of work heavy duty for three weeks.  DIET:  Regular.  CONDITION:  Stable.  FOLLOW-UP:  He will follow up to see me on June 28, 2000.  DISCHARGE INSTRUCTIONS:  He can shower and pat his wounds dry removing the dressings tomorrow morning.  HOSPITAL COURSE:  The patient is a 55 year old gentleman with abdominal pain localized to the right lower quadrant with a white count of 20,000.  He came in presenting with abdominal pain and a CT demonstrating acute appendicitis. He was taken to surgery on June 15, 2000 at which time acute appendicitis was confirmed.  Laparoscopic appendectomy was performed and he was discharged home the next day voiding well, eating well, with good bowel sounds.  His wound healing well with no evidence of infection.  He is to follow up and see me on June 28, 2000. DD:  06/16/00 TD:  06/18/00 Job: [id number]

## 2011-01-23 DIAGNOSIS — F319 Bipolar disorder, unspecified: Secondary | ICD-10-CM

## 2011-01-23 LAB — URINE CULTURE
Colony Count: 1000
Culture  Setup Time: 201205180928

## 2011-01-24 DIAGNOSIS — F319 Bipolar disorder, unspecified: Secondary | ICD-10-CM

## 2011-01-25 DIAGNOSIS — F4310 Post-traumatic stress disorder, unspecified: Secondary | ICD-10-CM
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
## 2011-01-26 ENCOUNTER — Other Ambulatory Visit: Payer: Self-pay | Admitting: Family Medicine

## 2011-01-26 DIAGNOSIS — M1710 Unilateral primary osteoarthritis, unspecified knee: Secondary | ICD-10-CM

## 2011-01-26 DIAGNOSIS — F4310 Post-traumatic stress disorder, unspecified: Secondary | ICD-10-CM

## 2011-01-26 DIAGNOSIS — M169 Osteoarthritis of hip, unspecified: Secondary | ICD-10-CM

## 2011-01-29 ENCOUNTER — Telehealth: Payer: Self-pay | Admitting: Family Medicine
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
## 2011-02-05 ENCOUNTER — Encounter: Payer: Self-pay | Admitting: Family Medicine

## 2011-02-05 ENCOUNTER — Ambulatory Visit (INDEPENDENT_AMBULATORY_CARE_PROVIDER_SITE_OTHER): Payer: Medicaid Other | Admitting: Family Medicine

## 2011-02-05 VITALS — BP 142/93 | HR 81

## 2011-02-05 DIAGNOSIS — M549 Dorsalgia, unspecified: Secondary | ICD-10-CM

## 2011-02-05 DIAGNOSIS — N529 Male erectile dysfunction, unspecified: Secondary | ICD-10-CM

## 2011-02-05 DIAGNOSIS — Z202 Contact with and (suspected) exposure to infections with a predominantly sexual mode of transmission: Secondary | ICD-10-CM | POA: Insufficient documentation

## 2011-02-05 DIAGNOSIS — F341 Dysthymic disorder: Secondary | ICD-10-CM

## 2011-02-05 MED ORDER — CITALOPRAM HYDROBROMIDE 10 MG PO TABS: 10.0000 mg | ORAL_TABLET | Freq: Every day | ORAL | Status: DC

## 2011-02-05 MED ORDER — TADALAFIL 5 MG PO TABS: 5.0000 mg | ORAL_TABLET | Freq: Every day | ORAL | Status: DC | PRN

## 2011-02-05 MED ORDER — OXYCODONE HCL 30 MG PO TB12: 30.0000 mg | ORAL_TABLET | Freq: Three times a day (TID) | ORAL | Status: DC

## 2011-02-05 MED ORDER — OXYCODONE-ACETAMINOPHEN 7.5-325 MG PO TABS: 1.0000 | ORAL_TABLET | Freq: Two times a day (BID) | ORAL | Status: DC

## 2011-02-05 NOTE — Assessment & Plan Note (Signed)
Pt continues to have chronic pain. Oxycontin and Percocet refilled today.

## 2011-02-05 NOTE — Assessment & Plan Note (Signed)
Pt continues to have depression. He was switched from Effexor to Celexa while at [facility]. Plan to continue Celexa. Pt given refill today.

## 2011-02-05 NOTE — Assessment & Plan Note (Signed)
Pt was given Rx for Cialis today. Pt was given a free 30 day trial card for Cialis with Rx.  Levitra Rx canceled.

## 2011-02-05 NOTE — Progress Notes (Signed)
Depression: Pt has some depression at baseline and also has some worsening symptoms right now because he has lost his housing (got kicked out of assisted living facility he was staying at). He is missing his girlfriend and can only stay at his current [facility] for about 60 days. He does not know where he is going to stay after that. He is trying to find housing.   Chronic pain: Pt continues to have chronic pain. He says that his pain was not well controlled at [facility]. He wants to stay on his current meds.   ED: Pt continues to have problems with ED. He has a 30 day free trial card for Cialis. Will switch off of Levitra and start Cialis. Pt has big weekend planned with his girlfriend.   ROS: neg except as noted above.   PE:  Gen: NAD, pt sitting in wheel chair.  Psych: Pt appears sad, flatter affect than normal, calm, good eye contact Skin: His leg wound is much improved, scar is very minimal. No erythema or exudates.

## 2011-02-05 NOTE — Patient Instructions (Signed)
It was good to see you today.  We refilled your pain meds and gave you an Rx and voucher for Cialis.  We are testing your blood for syphilis. I will let you know what we find and if you need to be treated.

## 2011-02-05 NOTE — Assessment & Plan Note (Signed)
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 I do not have copies of these labs or know if a reflex test was done. Plan to repeat RPR and have reflex testing done if needed. He has told his new girlfriend about it and she is planning to get tested as well.

## 2011-02-06 LAB — RPR

## 2011-02-08 ENCOUNTER — Encounter: Payer: Self-pay | Admitting: *Deleted

## 2011-02-08 ENCOUNTER — Encounter: Payer: Self-pay | Admitting: Family Medicine

## 2011-02-16 ENCOUNTER — Telehealth: Payer: Self-pay | Admitting: Family Medicine

## 2011-02-16 NOTE — Telephone Encounter (Signed)
Trying to find a facility and needs a FL2 to take with him.  Wants to know if this is OK to please mail to him: [address] [place] [address]

## 2011-02-26 NOTE — Telephone Encounter (Signed)
Yes, we can, I can help, we have FL2 forms at the desk.

## 2011-02-26 NOTE — Telephone Encounter (Signed)
Mardene Speak,  I got this message from Darla Lesches. Can we do this? And do we have FL2 forms that are generic and can be filled out in the office? Please advise. Thanks.

## 2011-02-26 NOTE — Telephone Encounter (Signed)
I would make copies, so that if he has several and we keep on here if he runs out.

## 2011-02-26 NOTE — Telephone Encounter (Signed)
Done, and up at the front for him to pick up. Thanks. Please let him know on Monday.

## 2011-03-04 ENCOUNTER — Ambulatory Visit (INDEPENDENT_AMBULATORY_CARE_PROVIDER_SITE_OTHER): Payer: Medicaid Other | Admitting: Family Medicine

## 2011-03-04 VITALS — BP 167/108 | HR 88

## 2011-03-04 DIAGNOSIS — I10 Essential (primary) hypertension: Secondary | ICD-10-CM

## 2011-03-04 DIAGNOSIS — E039 Hypothyroidism, unspecified: Secondary | ICD-10-CM

## 2011-03-04 DIAGNOSIS — F341 Dysthymic disorder: Secondary | ICD-10-CM

## 2011-03-04 DIAGNOSIS — M549 Dorsalgia, unspecified: Secondary | ICD-10-CM

## 2011-03-04 MED ORDER — OXYCODONE-ACETAMINOPHEN 7.5-325 MG PO TABS: 1.0000 | ORAL_TABLET | Freq: Two times a day (BID) | ORAL | Status: DC

## 2011-03-04 MED ORDER — LEVOTHYROXINE SODIUM 137 MCG PO TABS: 137.0000 ug | ORAL_TABLET | Freq: Every day | ORAL | Status: DC

## 2011-03-04 MED ORDER — AMLODIPINE BESYLATE 5 MG PO TABS: 5.0000 mg | ORAL_TABLET | Freq: Every day | ORAL | Status: DC

## 2011-03-04 MED ORDER — CITALOPRAM HYDROBROMIDE 20 MG PO TABS: 20.0000 mg | ORAL_TABLET | Freq: Every day | ORAL | Status: DC

## 2011-03-04 MED ORDER — METOPROLOL TARTRATE 25 MG PO TABS: 25.0000 mg | ORAL_TABLET | Freq: Two times a day (BID) | ORAL | Status: DC

## 2011-03-04 MED ORDER — TAMSULOSIN HCL 0.4 MG PO CAPS: 0.4000 mg | ORAL_CAPSULE | Freq: Every day | ORAL | Status: DC

## 2011-03-04 MED ORDER — KETOROLAC TROMETHAMINE 60 MG/2ML IM SOLN
60.0000 mg | Freq: Once | INTRAMUSCULAR | Status: AC
  Administered 2011-03-04: 60 mg via INTRAMUSCULAR

## 2011-03-04 MED ORDER — OXYCODONE HCL 30 MG PO TB12: 30.0000 mg | ORAL_TABLET | Freq: Three times a day (TID) | ORAL | Status: DC

## 2011-03-04 NOTE — Assessment & Plan Note (Signed)
Pt is wheelchair bound but can get up to urinate. He has recently been granted a motorized wheelchair but is currently not able to use it because he has to go up stairs at the friends house he is living at.  He plans to get back into an assisted living facility. He has FL2 forms that have been signed given to him today.  Meds were refilled. He will need to return next month for more pain meds.

## 2011-03-04 NOTE — Assessment & Plan Note (Signed)
Refilled Rx today

## 2011-03-04 NOTE — Patient Instructions (Signed)
I'm so sorry about your life struggles right now.  I am going to be praying for you.  Try to get the meds you can afford. You were given a bunch of your meds (the ones you need the most) as printed refills today.  Take Care and God Bless You.

## 2011-03-04 NOTE — Progress Notes (Signed)
Chronic pain: Pt is wheelchair bound but can get up to urinate. He is in here today after being out of his meds for a few days and having to walk around some. He is in tears with pain. He has recently been granted a motorized wheelchair but is currently not able to use it because he has to go up stairs at the friends house he is living at.  He plans to get back into an assisted living facility. He has FL2 forms that have been signed given to him today.  Depression: Pt is feeling very tearful and sad because his girlfriend has dumped him and he really loved her. He has been out of his meds and needs a refill.   HTN: Pt has been out of his BP meds and needs refills of his meds. He is living with a friend and having to borrow money to get by.   Hypothyoid: Pt has not been taking his Thyroid medicine fro the last 3 weeks. He is out of this medication and needs a refill. He is not feeling any side effects yet but I cautioned him that he needs to stay on this med to keep his hormone levels normal.  ROSl tearful, depressed, pain that is worsened  PE:  Gen: tearful but in NAD.  CV: RRR, no murmur Pulm: CTAB, no wheezing or crackles Back: Pt is very bent over while walking, has abnormal gait and uses a walker.   Spent > 30 min with this patient.

## 2011-03-04 NOTE — Progress Notes (Signed)
Addended by: Farrell Ours on: 03/04/2011 10:19 AM   Modules accepted: Orders

## 2011-03-10 ENCOUNTER — Emergency Department (HOSPITAL_COMMUNITY)
Admission: EM | Admit: 2011-03-10 | Discharge: 2011-03-16 | Disposition: A | Discharge status: Home / Self Care | Payer: Medicaid Other | Attending: Emergency Medicine | Admitting: Emergency Medicine

## 2011-03-10 ENCOUNTER — Ambulatory Visit (HOSPITAL_COMMUNITY)
Admission: RE | Admit: 2011-03-10 | Discharge: 2011-03-10 | Disposition: A | Discharge status: Home / Self Care | Payer: Medicaid Other | Attending: Psychiatry | Admitting: Psychiatry

## 2011-03-10 DIAGNOSIS — F329 Major depressive disorder, single episode, unspecified: Secondary | ICD-10-CM | POA: Insufficient documentation

## 2011-03-10 DIAGNOSIS — M549 Dorsalgia, unspecified: Secondary | ICD-10-CM | POA: Insufficient documentation

## 2011-03-10 DIAGNOSIS — F17200 Nicotine dependence, unspecified, uncomplicated: Secondary | ICD-10-CM | POA: Insufficient documentation

## 2011-03-10 DIAGNOSIS — F339 Major depressive disorder, recurrent, unspecified: Secondary | ICD-10-CM | POA: Insufficient documentation

## 2011-03-10 DIAGNOSIS — F3289 Other specified depressive episodes: Secondary | ICD-10-CM | POA: Insufficient documentation

## 2011-03-10 DIAGNOSIS — I10 Essential (primary) hypertension: Secondary | ICD-10-CM | POA: Insufficient documentation

## 2011-03-10 DIAGNOSIS — Z8619 Personal history of other infectious and parasitic diseases: Secondary | ICD-10-CM | POA: Insufficient documentation

## 2011-03-10 DIAGNOSIS — E039 Hypothyroidism, unspecified: Secondary | ICD-10-CM | POA: Insufficient documentation

## 2011-03-10 DIAGNOSIS — Z046 Encounter for general psychiatric examination, requested by authority: Secondary | ICD-10-CM | POA: Insufficient documentation

## 2011-03-10 DIAGNOSIS — L989 Disorder of the skin and subcutaneous tissue, unspecified: Secondary | ICD-10-CM | POA: Insufficient documentation

## 2011-03-10 LAB — DIFFERENTIAL
Basophils Relative: 1 % (ref 0–1)
Eosinophils Relative: 2 % (ref 0–5)
Lymphs Abs: 2.1 10*3/uL (ref 0.7–4.0)
Monocytes Absolute: 0.8 10*3/uL (ref 0.1–1.0)
Monocytes Relative: 8 % (ref 3–12)

## 2011-03-10 LAB — COMPREHENSIVE METABOLIC PANEL
AST: 15 U/L (ref 0–37)
CO2: 24 mEq/L (ref 19–32)
Chloride: 99 mEq/L (ref 96–112)
Creatinine, Ser: 0.65 mg/dL (ref 0.50–1.35)
GFR calc Af Amer: >60 mL/min (ref >60)
GFR calc non Af Amer: >60 mL/min (ref >60)
Glucose, Bld: 104 mg/dL — ABNORMAL HIGH (ref 70–99)
Total Bilirubin: 0.5 mg/dL (ref 0.3–1.2)

## 2011-03-10 LAB — CBC
HCT: 45.5 % (ref 39.0–52.0)
Hemoglobin: 16.5 g/dL (ref 13.0–17.0)
MCHC: 36.3 g/dL — ABNORMAL HIGH (ref 30.0–36.0)
MCV: 86.7 fL (ref 78.0–100.0)

## 2011-03-10 LAB — RAPID URINE DRUG SCREEN, HOSP PERFORMED
Barbiturates: NOT DETECTED
Opiates: NOT DETECTED
Tetrahydrocannabinol: POSITIVE — AB

## 2011-03-27 ENCOUNTER — Encounter: Payer: Self-pay | Admitting: Family Medicine

## 2011-05-27 LAB — CBC
HCT: 23.1 — ABNORMAL LOW
HCT: 24.4 — ABNORMAL LOW
HCT: 25 — ABNORMAL LOW
HCT: 25.6 — ABNORMAL LOW
HCT: 26.9 — ABNORMAL LOW
HCT: 27.9 — ABNORMAL LOW
HCT: 29.8 — ABNORMAL LOW
HCT: 34.6 — ABNORMAL LOW
HCT: 31.9 — ABNORMAL LOW
HCT: 32 — ABNORMAL LOW
Hemoglobin: 10.2 — ABNORMAL LOW
Hemoglobin: 10.5 — ABNORMAL LOW
Hemoglobin: 8.4 — ABNORMAL LOW
Hemoglobin: 8.6 — ABNORMAL LOW
Hemoglobin: 8.7 — ABNORMAL LOW
Hemoglobin: 8.8 — ABNORMAL LOW
Hemoglobin: 9.3 — ABNORMAL LOW
Hemoglobin: 9.6 — ABNORMAL LOW
Hemoglobin: 11.7 — ABNORMAL LOW
MCHC: 34.1
MCHC: 34.4
MCHC: 34.5
MCHC: 34.5
MCHC: 34.7
MCHC: 34.9
MCHC: 35
MCHC: 34.3
MCHC: 34.6
MCV: 100
MCV: 101.5 — ABNORMAL HIGH
MCV: 98.6
MCV: 99.1
MCV: 99.5
MCV: 99.6
MCV: 99.6
MCV: 98.2
MCV: 98.8
MCV: 98.9
MCV: 99.3
Platelets: 115 — ABNORMAL LOW
Platelets: 128 — ABNORMAL LOW
Platelets: 129 — ABNORMAL LOW
Platelets: 148 — ABNORMAL LOW
Platelets: 134 — ABNORMAL LOW
Platelets: 137 — ABNORMAL LOW
RBC: 2.33 — ABNORMAL LOW
RBC: 2.44 — ABNORMAL LOW
RBC: 2.47 — ABNORMAL LOW
RBC: 2.6 — ABNORMAL LOW
RBC: 2.76 — ABNORMAL LOW
RBC: 2.89 — ABNORMAL LOW
RBC: 3 — ABNORMAL LOW
RBC: 3.08 — ABNORMAL LOW
RBC: 3.4 — ABNORMAL LOW
RBC: 3.52 — ABNORMAL LOW
RBC: 3.23 — ABNORMAL LOW
RBC: 3.47 — ABNORMAL LOW
RBC: 3.58 — ABNORMAL LOW
RDW: 17 — ABNORMAL HIGH
RDW: 17.9 — ABNORMAL HIGH
RDW: 17.9 — ABNORMAL HIGH
RDW: 18.8 — ABNORMAL HIGH
RDW: 19.1 — ABNORMAL HIGH
RDW: 19.5 — ABNORMAL HIGH
RDW: 20.4 — ABNORMAL HIGH
RDW: 20.7 — ABNORMAL HIGH
RDW: 16 — ABNORMAL HIGH
RDW: 16.2 — ABNORMAL HIGH
WBC: 5.7
WBC: 6.6
WBC: 6.9
WBC: 7
WBC: 8.7
WBC: 9.2
WBC: 12.5 — ABNORMAL HIGH
WBC: 12 — ABNORMAL HIGH
WBC: 13.6 — ABNORMAL HIGH

## 2011-05-27 LAB — BASIC METABOLIC PANEL
BUN: 7
CO2: 25
CO2: 26
CO2: 26
CO2: 27
CO2: 28
Calcium: 7.8 — ABNORMAL LOW
Calcium: 8.8
Chloride: 107
Chloride: 108
Chloride: 108
Creatinine, Ser: 1.03
Creatinine, Ser: 1.4
GFR calc Af Amer: >60
GFR calc Af Amer: >60
GFR calc Af Amer: >60
GFR calc Af Amer: >60
GFR calc non Af Amer: >60
GFR calc non Af Amer: 54 — ABNORMAL LOW
Glucose, Bld: 100 — ABNORMAL HIGH
Glucose, Bld: 88
Potassium: 3.9
Sodium: 138
Sodium: 138
Sodium: 140
Sodium: 139

## 2011-05-27 LAB — DIFFERENTIAL
Basophils Absolute: 0.2 — ABNORMAL HIGH
Basophils Relative: 1
Eosinophils Absolute: 0.1
Eosinophils Relative: 1
Lymphocytes Relative: 22
Monocytes Absolute: 0.8

## 2011-05-27 LAB — COMPREHENSIVE METABOLIC PANEL
ALT: 25
AST: 100 — ABNORMAL HIGH
AST: 86 — ABNORMAL HIGH
Albumin: 2.2 — ABNORMAL LOW
Alkaline Phosphatase: 63
Alkaline Phosphatase: 81
Alkaline Phosphatase: 130 — ABNORMAL HIGH
BUN: 7
CO2: 21
CO2: 24
CO2: 26
Chloride: 105
Chloride: 98
Creatinine, Ser: 1.39
GFR calc Af Amer: >60
GFR calc Af Amer: >60
GFR calc non Af Amer: >60
GFR calc non Af Amer: >60
GFR calc non Af Amer: 54 — ABNORMAL LOW
Glucose, Bld: 107 — ABNORMAL HIGH
Potassium: 3.9
Potassium: 3.2 — ABNORMAL LOW
Sodium: 134 — ABNORMAL LOW
Total Bilirubin: 1.6 — ABNORMAL HIGH
Total Bilirubin: 1.9 — ABNORMAL HIGH
Total Bilirubin: 0.6
Total Protein: 6

## 2011-05-27 LAB — URINALYSIS, ROUTINE W REFLEX MICROSCOPIC
Bilirubin Urine: NEGATIVE
Ketones, ur: NEGATIVE
Leukocytes, UA: NEGATIVE
Nitrite: NEGATIVE
Protein, ur: 100 — AB
Urobilinogen, UA: 1

## 2011-05-27 LAB — POCT CARDIAC MARKERS
CKMB, poc: <1 — ABNORMAL LOW
Myoglobin, poc: 147
Troponin i, poc: <0.05

## 2011-05-27 LAB — CREATININE, SERUM
Creatinine, Ser: 1.01
GFR calc Af Amer: >60

## 2011-05-27 LAB — VANCOMYCIN, TROUGH: Vancomycin Tr: 14.8

## 2011-05-27 LAB — RETICULOCYTES
RBC.: 2.89 — ABNORMAL LOW
Retic Ct Pct: 6.7 — ABNORMAL HIGH

## 2011-05-27 LAB — T4, FREE: Free T4: 0.98

## 2011-05-27 LAB — CULTURE, BLOOD (ROUTINE X 2)

## 2011-05-27 LAB — ETHANOL: Alcohol, Ethyl (B): <5

## 2011-05-27 LAB — URINE MICROSCOPIC-ADD ON

## 2011-06-01 ENCOUNTER — Emergency Department (HOSPITAL_COMMUNITY): Payer: Medicaid Other

## 2011-06-01 ENCOUNTER — Emergency Department (HOSPITAL_COMMUNITY)
Admission: EM | Admit: 2011-06-01 | Discharge: 2011-06-01 | Disposition: A | Discharge status: Home / Self Care | Payer: Medicaid Other | Attending: Emergency Medicine | Admitting: Emergency Medicine

## 2011-06-01 ENCOUNTER — Other Ambulatory Visit: Payer: Self-pay

## 2011-06-01 ENCOUNTER — Encounter (HOSPITAL_COMMUNITY): Payer: Self-pay

## 2011-06-01 DIAGNOSIS — S2239XA Fracture of one rib, unspecified side, initial encounter for closed fracture: Secondary | ICD-10-CM | POA: Insufficient documentation

## 2011-06-01 DIAGNOSIS — IMO0002 Reserved for concepts with insufficient information to code with codable children: Secondary | ICD-10-CM | POA: Insufficient documentation

## 2011-06-01 DIAGNOSIS — R079 Chest pain, unspecified: Secondary | ICD-10-CM | POA: Insufficient documentation

## 2011-06-01 DIAGNOSIS — F17200 Nicotine dependence, unspecified, uncomplicated: Secondary | ICD-10-CM | POA: Insufficient documentation

## 2011-06-01 DIAGNOSIS — X58XXXA Exposure to other specified factors, initial encounter: Secondary | ICD-10-CM | POA: Insufficient documentation

## 2011-06-01 HISTORY — DX: Reserved for concepts with insufficient information to code with codable children: IMO0002

## 2011-06-01 LAB — DIFFERENTIAL
Basophils Absolute: 0.1 10*3/uL (ref 0.0–0.1)
Lymphocytes Relative: 23 % (ref 12–46)
Lymphs Abs: 2.5 10*3/uL (ref 0.7–4.0)
Neutro Abs: 7.3 10*3/uL (ref 1.7–7.7)
Neutrophils Relative %: 66 % (ref 43–77)

## 2011-06-01 LAB — COMPREHENSIVE METABOLIC PANEL
ALT: 9 U/L (ref 0–53)
AST: 14 U/L (ref 0–37)
Alkaline Phosphatase: 124 U/L — ABNORMAL HIGH (ref 39–117)
CO2: 28 mEq/L (ref 19–32)
GFR calc Af Amer: >60 mL/min (ref >60)
GFR calc non Af Amer: >60 mL/min (ref >60)
Glucose, Bld: 92 mg/dL (ref 70–99)
Potassium: 4.7 mEq/L (ref 3.5–5.1)
Sodium: 136 mEq/L (ref 135–145)

## 2011-06-01 LAB — CBC
Platelets: 273 10*3/uL (ref 150–400)
RBC: 5.35 MIL/uL (ref 4.22–5.81)
WBC: 11 10*3/uL — ABNORMAL HIGH (ref 4.0–10.5)

## 2011-06-01 MED ORDER — ONDANSETRON HCL 4 MG/2ML IJ SOLN
4.0000 mg | Freq: Once | INTRAMUSCULAR | Status: AC
  Administered 2011-06-01: 4 mg via INTRAVENOUS
  Filled 2011-06-01: qty 2

## 2011-06-01 MED ORDER — HYDROMORPHONE HCL 1 MG/ML IJ SOLN: 1.0000 mg | Freq: Once | INTRAMUSCULAR | Status: DC

## 2011-06-01 MED ORDER — HYDROMORPHONE HCL 1 MG/ML IJ SOLN
1.0000 mg | Freq: Once | INTRAMUSCULAR | Status: AC
  Administered 2011-06-01: 1 mg via INTRAVENOUS

## 2011-06-01 MED ORDER — HYDROMORPHONE HCL 1 MG/ML IJ SOLN
1.0000 mg | Freq: Once | INTRAMUSCULAR | Status: AC
  Administered 2011-06-01: 1 mg via INTRAVENOUS
  Filled 2011-06-01: qty 1

## 2011-06-01 MED ORDER — SODIUM CHLORIDE 0.9 % IV SOLN
Freq: Once | INTRAVENOUS | Status: AC
  Administered 2011-06-01: 18:00:00 via INTRAVENOUS

## 2011-06-01 MED ORDER — IOHEXOL 350 MG/ML SOLN
100.0000 mL | Freq: Once | INTRAVENOUS | Status: AC | PRN
  Administered 2011-06-01: 100 mL via INTRAVENOUS

## 2011-06-01 MED ORDER — SODIUM CHLORIDE 0.9 % IV SOLN
INTRAVENOUS | Status: DC
  Administered 2011-06-01: 19:00:00 via INTRAVENOUS

## 2011-06-01 NOTE — ED Notes (Signed)
Radiologist transporter called RN to room, Pt had removed blue jeans and was noted to have a white sock wrapped around lt leg below the knee. When sock was removed skin tear/ decubitus type area was seen on outer lt leg just below the knee. Area was cleaned with saf-clens, telfa applied and wrapped with cling. Pt states has has sore x 2 yrs secondary to fascitis. Serous fluid noted on sock and oozing after cleansed. Pt states does not hurt.

## 2011-06-01 NOTE — ED Notes (Signed)
C/o upper back/chest pain for 3 weeks. Worse with inspiration. Pt describes "pain in my lung that is sharp when I take deep breath" denies sob.

## 2011-06-01 NOTE — ED Notes (Signed)
Pt c/o severe back pain that radiates to his right shoulder and down his right leg. Pt states he is also having pain to his right upper abd. Pt states pain increases with inspiration.

## 2011-06-01 NOTE — ED Provider Notes (Signed)
History    Scribed for Jeffrey Lennert, MD, the patient was seen in room APA14/APA14. This chart was scribed by Katha Cabal. This patient's care was started at 17:23.    CSN: [id number] Arrival date & time: 06/01/2011  5:20 PM  Chief Complaint  Patient presents with  . Back Pain  . Chest Pain    HPI  (Consider location/radiation/quality/duration/timing/severity/associated sxs/prior treatment)  HPI  Jeffrey [name] is a 55 y.o. male who presents to the Emergency Department complaining of gradual worsening of mid back pain that radiates to right flank with associated "stabbing sharp" right chest pain.  Denies SOB. Chest pain worsens with inspiration and laying flat for the past 3 days.  Patient noticed a knot on his upper back about 3 weeks ago.  Patient has chronic low pack pain and degenerative left hip arthritis therefore resides at [facility]. Patient is not able to work.       PAST MEDICAL HISTORY:  Past Medical History  Diagnosis Date  . DDD (degenerative disc disease)     PAST SURGICAL HISTORY:  Past Surgical History  Procedure Date  . Hip surgery   . Appendectomy     FAMILY HISTORY:  No family history on file.   SOCIAL HISTORY: History   Social History  . Marital Status: Single    Spouse Name: N/A    Number of Children: N/A  . Years of Education: N/A   Social History Main Topics  . Smoking status: Current Everyday Smoker  . Smokeless tobacco: None  . Alcohol Use: No  . Drug Use: None  . Sexually Active: None   Other Topics Concern  . None   Social History Narrative  . None      Review of Systems  Review of Systems  Constitutional: Negative for fatigue.  HENT: Negative for congestion, sinus pressure and ear discharge.   Eyes: Negative for discharge.  Respiratory: Negative for cough and shortness of breath.   Cardiovascular: Positive for chest pain.  Gastrointestinal: Negative for abdominal pain and diarrhea.  Genitourinary:  Positive for flank pain. Negative for frequency and hematuria.  Musculoskeletal: Positive for back pain.  Skin: Negative for rash.  Neurological: Negative for seizures and headaches.  Hematological: Negative.   Psychiatric/Behavioral: Negative for hallucinations.    Allergies  Review of patient's allergies indicates no known allergies.  Home Medications   Current Outpatient Rx  Name Route Sig Dispense Refill  . AMLODIPINE BESYLATE 5 MG PO TABS Oral Take 1 tablet (5 mg total) by mouth daily. 30 tablet 3  . CITALOPRAM HYDROBROMIDE 20 MG PO TABS Oral Take 1 tablet (20 mg total) by mouth daily. 30 tablet 3  . LEVOTHYROXINE SODIUM 137 MCG PO TABS Oral Take 1 tablet (137 mcg total) by mouth daily. 30 tablet 3  . LORATADINE 10 MG PO TABS Oral Take 10 mg by mouth daily.      Marland Kitchen METOPROLOL TARTRATE 25 MG PO TABS Oral Take 1 tablet (25 mg total) by mouth 2 (two) times daily. 60 tablet 3  . OXYCODONE HCL 30 MG PO TB12 Oral Take 1 tablet (30 mg total) by mouth 3 (three) times daily. 90 tablet 0  . OXYCODONE-ACETAMINOPHEN 7.5-325 MG PO TABS Oral Take 1 tablet by mouth 2 (two) times daily. As needed for breakthrough pain 60 tablet 0  . TAMSULOSIN HCL 0.4 MG PO CAPS Oral Take 1 capsule (0.4 mg total) by mouth at bedtime. 60 capsule 3  . CYCLOBENZAPRINE HCL 10  MG PO TABS Oral Take 1 tablet (10 mg total) by mouth 3 (three) times daily as needed. For muscle spasms  60 tablet 3  . DAILY MULTIPLE VITAMINS PO Oral Take by mouth daily.      Marland Kitchen FERROUS SULFATE 325 (65 FE) MG PO TABS Oral Take 325 mg by mouth daily.      Marland Kitchen GABAPENTIN 300 MG PO CAPS Oral Take 600 mg by mouth 4 (four) times daily.      . IBUPROFEN 800 MG PO TABS Oral Take 800 mg by mouth every 8 (eight) hours as needed.      Marland Kitchen RANITIDINE HCL 150 MG PO CAPS Oral Take 150 mg by mouth 2 (two) times daily.        Physical Exam    BP 141/82  Pulse 70  Temp(Src) 98.1 F (36.7 C) (Oral)  Resp 18  Ht 6\' 4"  (1.93 m)  Wt 217 lb (98.431 kg)  BMI  26.41 kg/m2  SpO2 100%  Physical Exam  Constitutional: He is oriented to person, place, and time. He appears well-developed.  HENT:  Head: Normocephalic and atraumatic.  Eyes: Conjunctivae and EOM are normal. No scleral icterus.  Neck: Neck supple. No thyromegaly present.  Cardiovascular: Normal rate and regular rhythm.  Exam reveals no gallop and no friction rub.   No murmur heard. Pulmonary/Chest: Effort normal and breath sounds normal. No stridor. No respiratory distress. He has no wheezes. He has no rales. He exhibits no tenderness.  Abdominal: Soft. He exhibits no distension. There is no tenderness. There is no rebound.  Musculoskeletal: Normal range of motion. He exhibits tenderness. He exhibits no edema.       Right flank and lumbar spine tenderness   Lymphadenopathy:    He has no cervical adenopathy.  Neurological: He is alert and oriented to person, place, and time. Coordination normal.  Skin: No rash noted. No erythema.  Psychiatric: He has a normal mood and affect. His behavior is normal.    ED Course  Procedures (including critical care time)  OTHER DATA REVIEWED: Nursing notes, vital signs, and past medical records reviewed.   DIAGNOSTIC STUDIES: Oxygen Saturation is 100% on room air, normal by my interpretation.     Date: 06/01/2011  Rate: 64  Rhythm: normal sinus rhythm  QRS Axis: normal  Intervals: normal  ST/T Wave abnormalities: normal  Conduction Disutrbances:none  Narrative Interpretation:   Old EKG Reviewed: none available    LABS / RADIOLOGY:  Results for orders placed during the hospital encounter of 06/01/11  CBC      Component Value Range   WBC 11.0 (*) 4.0 - 10.5 (K/uL)   RBC 5.35  4.22 - 5.81 (MIL/uL)   Hemoglobin 15.7  13.0 - 17.0 (g/dL)   HCT 46.9  62.9 - 52.8 (%)   MCV 87.1  78.0 - 100.0 (fL)   MCH 29.3  26.0 - 34.0 (pg)   MCHC 33.7  30.0 - 36.0 (g/dL)   RDW 41.3  24.4 - 01.0 (%)   Platelets 273  150 - 400 (K/uL)  DIFFERENTIAL       Component Value Range   Neutrophils Relative 66  43 - 77 (%)   Neutro Abs 7.3  1.7 - 7.7 (K/uL)   Lymphocytes Relative 23  12 - 46 (%)   Lymphs Abs 2.5  0.7 - 4.0 (K/uL)   Monocytes Relative 7  3 - 12 (%)   Monocytes Absolute 0.8  0.1 - 1.0 (K/uL)   Eosinophils  Relative 3  0 - 5 (%)   Eosinophils Absolute 0.4  0.0 - 0.7 (K/uL)   Basophils Relative 1  0 - 1 (%)   Basophils Absolute 0.1  0.0 - 0.1 (K/uL)  COMPREHENSIVE METABOLIC PANEL      Component Value Range   Sodium 136  135 - 145 (mEq/L)   Potassium 4.7  3.5 - 5.1 (mEq/L)   Chloride 97  96 - 112 (mEq/L)   CO2 28  19 - 32 (mEq/L)   Glucose, Bld 92  70 - 99 (mg/dL)   BUN 21  6 - 23 (mg/dL)   Creatinine, Ser 2.72  0.50 - 1.35 (mg/dL)   Calcium 9.7  8.4 - 53.6 (mg/dL)   Total Protein 7.6  6.0 - 8.3 (g/dL)   Albumin 3.8  3.5 - 5.2 (g/dL)   AST 14  0 - 37 (U/L)   ALT 9  0 - 53 (U/L)   Alkaline Phosphatase 124 (*) 39 - 117 (U/L)   Total Bilirubin 0.3  0.3 - 1.2 (mg/dL)   GFR calc non Af Amer >60  >60 (mL/min)   GFR calc Af Amer >60  >60 (mL/min)     Dg Chest 2 View  06/01/2011  *RADIOLOGY REPORT*  Clinical Data: Chest pain.  CHEST - 2 VIEW  Comparison: 05/22/2010  Findings: Heart and mediastinal contours are within normal limits. No focal opacities or effusions.  No acute bony abnormality.  Mild peribronchial thickening.  IMPRESSION: Mild bronchitic changes.  Original Report Authenticated By: Cyndie Chime, M.D.   Ct Angio Chest W/cm &/or Wo Cm  06/01/2011  *RADIOLOGY REPORT*  Clinical Data:  Stabbing chest pain, worse with inspiration  CT ANGIOGRAPHY CHEST WITH CONTRAST  Technique:  Multidetector CT imaging of the chest was performed using the standard protocol during bolus administration of intravenous contrast.  Multiplanar CT image reconstructions including MIPs were obtained to evaluate the vascular anatomy.  Contrast:  100 ml Omnipaque 350  Comparison:  PA and lateral chest radiograph - 06/01/2011; CT abdomen and pelvis -  10/04/2009  Findings:  There is suboptimal opacification of the pulmonary arteries of the main pulmonary artery only measuring 210 HU.  Given this limitation, there is no discrete filling defect within the main, right, left or major segmental pulmonary arteries.  Evaluation of the subsegmental pulmonary arteries is limited secondary to suboptimal vessel opacification.  Minimal biapical paraseptal emphysema.  No pneumothorax.  Dependent bibasilar ground-glass opacities with heterogeneous opacities about the superior aspect of the right major fissure favored to represent atelectasis.  No focal airspace opacities.  Indeterminate 3-mm nodules within the left lower lobe (image 64) and superior segment of the lingula (image 55).  Trace right-sided pleural effusion.   Coronary artery calcifications normal heart size.  No pericardial effusion.  Normal configuration of the thoracic aorta.  Scattered atherosclerotic calcifications within normal caliber thoracic aorta. No mediastinal, hilar or axillary lymphadenopathy.  Limited early arterial phase evaluation of the upper abdomen demonstrates a nodular contour of the hepatic parenchyma as was demonstrated on prior abdominal CT.  Acute, minimally displaced fracture of the right 12th rib.  Review of the MIP images confirms the above findings.  IMPRESSION:     1.     Negative for pulmonary embolism to the level of the       subsegmental pulmonary arteries.        2.    Acute, minimally displaced fracture of the right 12th rib, a possible explanation for the patient's  symptoms. 3.    Indeterminate 3 mm nodules within the left lower lobe and lingula.  If the patient is at high risk for bronchogenic carcinoma, follow-up chest CT at 1 year is recommended.  If the patient is at low risk, no follow-up is needed.  This recommendation follows the consensus statement: Guidelines for Management of Small Pulmonary Nodules Detected on CT Scans:  A Statement from the Fleischner Society as  published in Radiology 2005; 237:395-400.  Available online at: DietDisorder.cz. 4.    Nodular contour of the liver suggestive of cirrhosis, similar to the prior abdominal CT from 09/2009. Original Report Authenticated By: Waynard Reeds, M.D.    ED COURSE / COORDINATION OF CARE: 17:34  Physical exam compete will order labs and CXR. Pain control.   20:30  Discussed radiological findings with patient.  Patient has minimally displaced fx of 12th rib.   Plan to discharge patient.    Orders Placed This Encounter  Procedures  . DG Chest 2 View  . CT Angio Chest W/Cm &/Or Wo Cm  . CBC  . Differential  . Comprehensive metabolic panel  . ED EKG       IMPRESSION: Diagnoses that have been ruled out:  Diagnoses that are still under consideration:  Final diagnoses:     MEDICATIONS GIVEN IN THE E.D. Scheduled Meds:    . sodium chloride   Intravenous Once  . HYDROmorphone  1 mg Intravenous Once  . ondansetron  4 mg Intravenous Once   Continuous Infusions:    . sodium chloride 100 mL/hr at 06/01/11 1918      DISCHARGE MEDICATIONS: New Prescriptions   No medications on file     The chart was scribed for me under my direct supervision.  I personally performed the history, physical, and medical decision making and all procedures in the evaluation of this patient.Jeffrey Lennert, MD 06/01/11 2037

## 2011-06-04 NOTE — ED Provider Notes (Signed)
Received call from Kandice Hams, RNP.  Mr. Haliburton has a pain contract with her.  He has run out of his Oxycodone 7.5 mg tablets because he was advised to take them 4 times per day instead of what he was contracted for, which was twice a day.  She request that he be prescribed enough of his medicine on a bid basis to get to October 9.  I wrote this prescription for him (Oxycodone 7.5 mg, #24, one bid, no refill); his caregiver from his group home will pick it up.  Carleene Cooper III, MD 06/04/11 1410

## 2011-06-11 LAB — CROSSMATCH
ABO/RH(D): B NEG
Antibody Screen: NEGATIVE

## 2011-06-11 LAB — CBC
HCT: 21 — ABNORMAL LOW
Hemoglobin: 7.5 — CL
MCHC: 35.6
MCV: 101.3 — ABNORMAL HIGH
MCV: 102.2 — ABNORMAL HIGH
Platelets: 82 — ABNORMAL LOW
Platelets: 90 — ABNORMAL LOW
RBC: 2.06 — ABNORMAL LOW
RDW: 16.3 — ABNORMAL HIGH
RDW: 16.4 — ABNORMAL HIGH
WBC: 7.4
WBC: 8.2

## 2011-06-11 LAB — SEDIMENTATION RATE: Sed Rate: >140 — ABNORMAL HIGH

## 2011-06-11 LAB — BASIC METABOLIC PANEL
BUN: 16
BUN: 18
CO2: 21
Calcium: 8 — ABNORMAL LOW
Chloride: 105
Chloride: 106
Creatinine, Ser: 0.77
Creatinine, Ser: 0.79
GFR calc Af Amer: >60
GFR calc non Af Amer: >60
GFR calc non Af Amer: >60
Glucose, Bld: 106 — ABNORMAL HIGH
Glucose, Bld: 110 — ABNORMAL HIGH
Potassium: 4.4
Sodium: 136

## 2011-06-11 LAB — URIC ACID: Uric Acid, Serum: 7.1 — ABNORMAL HIGH

## 2011-06-11 LAB — DIFFERENTIAL
Basophils Absolute: 0.1
Basophils Relative: 1
Eosinophils Absolute: 0
Eosinophils Relative: 1
Lymphs Abs: 2.2
Neutrophils Relative %: 56

## 2011-06-11 LAB — ABO/RH: ABO/RH(D): B NEG

## 2011-06-16 LAB — CULTURE, BLOOD (ROUTINE X 2): Culture: NO GROWTH

## 2011-06-16 LAB — CBC
HCT: 34.1 — ABNORMAL LOW
HCT: 35.4 — ABNORMAL LOW
Hemoglobin: 12.3 — ABNORMAL LOW
MCHC: 34.8
MCV: 100.1 — ABNORMAL HIGH
Platelets: 73 — ABNORMAL LOW
Platelets: 85 — ABNORMAL LOW
RBC: 3.53 — ABNORMAL LOW
RDW: 14.2 — ABNORMAL HIGH
RDW: 14.3 — ABNORMAL HIGH
WBC: 5.2
WBC: 5.6

## 2011-06-16 LAB — COMPREHENSIVE METABOLIC PANEL
AST: 97 — ABNORMAL HIGH
AST: 98 — ABNORMAL HIGH
Albumin: 2.3 — ABNORMAL LOW
Albumin: 2.4 — ABNORMAL LOW
Alkaline Phosphatase: 140 — ABNORMAL HIGH
Alkaline Phosphatase: 194 — ABNORMAL HIGH
BUN: 6
Chloride: 103
Creatinine, Ser: 0.69
GFR calc Af Amer: >60
GFR calc Af Amer: >60
GFR calc non Af Amer: >60
Potassium: 3.5
Potassium: 4
Sodium: 133 — ABNORMAL LOW
Total Bilirubin: 0.9
Total Bilirubin: 1.6 — ABNORMAL HIGH
Total Protein: 6.8
Total Protein: 6.8

## 2011-06-16 LAB — DIFFERENTIAL
Basophils Absolute: 0
Basophils Absolute: 0.1
Basophils Relative: 1
Eosinophils Absolute: 0.1
Eosinophils Absolute: 0.1
Eosinophils Relative: 2
Eosinophils Relative: 2
Lymphocytes Relative: 42
Lymphocytes Relative: 45
Lymphs Abs: 2.2
Lymphs Abs: 2.5
Monocytes Absolute: 0.4
Monocytes Relative: 7
Neutro Abs: 2.5
Neutrophils Relative %: 45
Neutrophils Relative %: 46

## 2011-06-16 LAB — COMPREHENSIVE METABOLIC PANEL WITH GFR
ALT: 33
BUN: 8
CO2: 22
Calcium: 8.2 — ABNORMAL LOW
Creatinine, Ser: 0.9
GFR calc non Af Amer: >60
Glucose, Bld: 90

## 2011-06-16 LAB — URIC ACID: Uric Acid, Serum: 6.7

## 2011-06-18 LAB — RAPID URINE DRUG SCREEN, HOSP PERFORMED
Amphetamines: NOT DETECTED
Benzodiazepines: NOT DETECTED
Cocaine: POSITIVE — AB
Opiates: NOT DETECTED
Tetrahydrocannabinol: NOT DETECTED

## 2011-06-18 LAB — I-STAT 8, (EC8 V) (CONVERTED LAB)
Acid-base deficit: 4 — ABNORMAL HIGH
BUN: 8
Bicarbonate: 20.3
Chloride: 108
Glucose, Bld: 105 — ABNORMAL HIGH
HCT: 42
Hemoglobin: 14.3
Operator id: 294511
Potassium: 3.8
Sodium: 142
TCO2: 21
pCO2, Ven: 34 — ABNORMAL LOW
pH, Ven: 7.385 — ABNORMAL HIGH

## 2011-06-18 LAB — URINALYSIS, ROUTINE W REFLEX MICROSCOPIC
Bilirubin Urine: NEGATIVE
Ketones, ur: NEGATIVE
Nitrite: NEGATIVE
Protein, ur: NEGATIVE
Urobilinogen, UA: 1

## 2011-06-18 LAB — ETHANOL
Alcohol, Ethyl (B): 287 — ABNORMAL HIGH
Alcohol, Ethyl (B): 387 — ABNORMAL HIGH

## 2011-06-18 LAB — CBC
Hemoglobin: 12.2 — ABNORMAL LOW
MCHC: 34.8
RBC: 3.49 — ABNORMAL LOW
RDW: 13.9

## 2011-06-25 ENCOUNTER — Encounter (HOSPITAL_COMMUNITY): Payer: Self-pay | Admitting: Emergency Medicine

## 2011-06-25 ENCOUNTER — Emergency Department (HOSPITAL_COMMUNITY): Payer: Medicaid Other

## 2011-06-25 ENCOUNTER — Emergency Department (HOSPITAL_COMMUNITY)
Admission: EM | Admit: 2011-06-25 | Discharge: 2011-06-25 | Disposition: A | Discharge status: Home / Self Care | Payer: Medicaid Other | Attending: Emergency Medicine | Admitting: Emergency Medicine

## 2011-06-25 DIAGNOSIS — I10 Essential (primary) hypertension: Secondary | ICD-10-CM | POA: Insufficient documentation

## 2011-06-25 DIAGNOSIS — S2231XA Fracture of one rib, right side, initial encounter for closed fracture: Secondary | ICD-10-CM

## 2011-06-25 DIAGNOSIS — M726 Necrotizing fasciitis: Secondary | ICD-10-CM | POA: Insufficient documentation

## 2011-06-25 DIAGNOSIS — R109 Unspecified abdominal pain: Secondary | ICD-10-CM | POA: Insufficient documentation

## 2011-06-25 DIAGNOSIS — X58XXXA Exposure to other specified factors, initial encounter: Secondary | ICD-10-CM | POA: Insufficient documentation

## 2011-06-25 DIAGNOSIS — F17200 Nicotine dependence, unspecified, uncomplicated: Secondary | ICD-10-CM | POA: Insufficient documentation

## 2011-06-25 DIAGNOSIS — J90 Pleural effusion, not elsewhere classified: Secondary | ICD-10-CM | POA: Insufficient documentation

## 2011-06-25 DIAGNOSIS — S2239XA Fracture of one rib, unspecified side, initial encounter for closed fracture: Secondary | ICD-10-CM | POA: Insufficient documentation

## 2011-06-25 DIAGNOSIS — IMO0002 Reserved for concepts with insufficient information to code with codable children: Secondary | ICD-10-CM | POA: Insufficient documentation

## 2011-06-25 HISTORY — DX: Necrotizing fasciitis: M72.6

## 2011-06-25 HISTORY — DX: Essential (primary) hypertension: I10

## 2011-06-25 LAB — URINALYSIS, ROUTINE W REFLEX MICROSCOPIC
Bilirubin Urine: NEGATIVE
Ketones, ur: NEGATIVE mg/dL
Protein, ur: NEGATIVE mg/dL
Specific Gravity, Urine: 1.02 (ref 1.005–1.030)
Urobilinogen, UA: 0.2 mg/dL (ref 0.0–1.0)

## 2011-06-25 LAB — URINE MICROSCOPIC-ADD ON

## 2011-06-25 MED ORDER — KETOROLAC TROMETHAMINE 30 MG/ML IJ SOLN
30.0000 mg | Freq: Once | INTRAMUSCULAR | Status: AC
  Administered 2011-06-25: 30 mg via INTRAVENOUS
  Filled 2011-06-25: qty 1

## 2011-06-25 MED ORDER — HYDROMORPHONE HCL 2 MG/ML IJ SOLN
2.0000 mg | Freq: Once | INTRAMUSCULAR | Status: AC
  Administered 2011-06-25: 2 mg via INTRAMUSCULAR
  Filled 2011-06-25: qty 1

## 2011-06-25 MED ORDER — AZITHROMYCIN 250 MG PO TABS: 250.0000 mg | ORAL_TABLET | Freq: Every day | ORAL | Status: AC

## 2011-06-25 MED ORDER — ONDANSETRON HCL 4 MG/2ML IJ SOLN
4.0000 mg | Freq: Once | INTRAMUSCULAR | Status: AC
  Administered 2011-06-25: 4 mg via INTRAVENOUS
  Filled 2011-06-25: qty 2

## 2011-06-25 MED ORDER — OXYCODONE-ACETAMINOPHEN 5-325 MG PO TABS: 2.0000 | ORAL_TABLET | ORAL | Status: DC | PRN

## 2011-06-25 MED ORDER — HYDROMORPHONE HCL 1 MG/ML IJ SOLN
1.0000 mg | Freq: Once | INTRAMUSCULAR | Status: AC
  Administered 2011-06-25: 1 mg via INTRAVENOUS
  Filled 2011-06-25: qty 1

## 2011-06-25 NOTE — ED Provider Notes (Signed)
Scribed for Donnetta Hutching, MD, the patient was seen in room APA16A/APA16A. This chart was scribed by [employer]. The patient's care started at 15:27  CSN: [id number] Arrival date & time: 06/25/2011  3:24 PM   First MD Initiated Contact with Patient 06/25/11 1527      Chief Complaint  Patient presents with  . Back Pain  . Shortness of Breath   HPI Jeffrey [name] is a 55 y.o. male with a history of Degenerative disc disease and Hypertensionwho presents to the Emergency Department complaining of Back Pain. Patient localizes pain to the mid back. Pain is sharp. Patient states it feels like a knife in his ribs. Pain is aggravated by inhalation. Patient reports a history of a broken rib and states that current pain is very similar to a history of broken ribs. Patient is in a wheelchair at baseline due to Degenerative Disc Disease and Necrotizing fasciitis. Patient is currently in a care facility.   Past Medical History  Diagnosis Date  . DDD (degenerative disc disease)   . Necrotizing fasciitis   . Degenerative disc disease   . Hypertension     Past Surgical History  Procedure Date  . Hip surgery   . Appendectomy     History reviewed. No pertinent family history.  History  Substance Use Topics  . Smoking status: Current Everyday Smoker -- 1.0 packs/day for 36 years  . Smokeless tobacco: Former Neurosurgeon    Types: Chew  . Alcohol Use: No      Review of Systems  All other systems reviewed and are negative.    Allergies  Review of patient's allergies indicates no known allergies.  Home Medications   Current Outpatient Rx  Name Route Sig Dispense Refill  . AMLODIPINE BESYLATE 5 MG PO TABS Oral Take 1 tablet (5 mg total) by mouth daily. 30 tablet 3  . CITALOPRAM HYDROBROMIDE 20 MG PO TABS Oral Take 1 tablet (20 mg total) by mouth daily. 30 tablet 3  . CYCLOBENZAPRINE HCL 10 MG PO TABS Oral Take 1 tablet (10 mg total) by mouth 3 (three) times daily as needed. For muscle  spasms  60 tablet 3  . DAILY MULTIPLE VITAMINS PO Oral Take by mouth daily.      Marland Kitchen FERROUS SULFATE 325 (65 FE) MG PO TABS Oral Take 325 mg by mouth daily.      Marland Kitchen GABAPENTIN 300 MG PO CAPS Oral Take 600 mg by mouth 4 (four) times daily.      . IBUPROFEN 800 MG PO TABS Oral Take 800 mg by mouth every 8 (eight) hours as needed.      Marland Kitchen LEVOTHYROXINE SODIUM 137 MCG PO TABS Oral Take 1 tablet (137 mcg total) by mouth daily. 30 tablet 3  . LORATADINE 10 MG PO TABS Oral Take 10 mg by mouth daily.      Marland Kitchen METOPROLOL TARTRATE 25 MG PO TABS Oral Take 1 tablet (25 mg total) by mouth 2 (two) times daily. 60 tablet 3  . OXYCODONE HCL 30 MG PO TB12 Oral Take 1 tablet (30 mg total) by mouth 3 (three) times daily. 90 tablet 0  . OXYCODONE-ACETAMINOPHEN 7.5-325 MG PO TABS Oral Take 1 tablet by mouth 2 (two) times daily. As needed for breakthrough pain 60 tablet 0  . RANITIDINE HCL 150 MG PO CAPS Oral Take 150 mg by mouth 2 (two) times daily.      Marland Kitchen TAMSULOSIN HCL 0.4 MG PO CAPS Oral Take 1 capsule (0.4 mg total)  by mouth at bedtime. 60 capsule 3    BP 123/73  Pulse 66  Temp(Src) 97.8 F (36.6 C) (Oral)  Resp 20  Ht 6\' 4"  (1.93 m)  Wt 227 lb (102.967 kg)  BMI 27.63 kg/m2  SpO2 97%  Physical Exam  Nursing note and vitals reviewed. Constitutional: He is oriented to person, place, and time. He appears well-developed and well-nourished.  HENT:  Head: Normocephalic.  Eyes: EOM are normal.  Neck: Normal range of motion.  Cardiovascular: Normal rate and regular rhythm.   Pulmonary/Chest: Effort normal.  Musculoskeletal: Normal range of motion. He exhibits tenderness (Minimal tenderness right posterior ribs.).  Neurological: He is alert and oriented to person, place, and time. No cranial nerve deficit.  Skin: Skin is warm and dry. He is not diaphoretic.       Abrasion on the left lateral knee Patient with an oblique scar on his left buttocks.  Psychiatric: He has a normal mood and affect. His behavior is  normal.    ED Course  Procedures   DIAGNOSTIC STUDIES: Oxygen Saturation is 97% on room air, normal by my interpretation.    COORDINATION OF CARE: 15:52 - EDP examined patient at bedside. Pain management, UA, right rib X-rays ordered.    Results for orders placed during the hospital encounter of 06/25/11  URINALYSIS, ROUTINE W REFLEX MICROSCOPIC      Component Value Range   Color, Urine YELLOW  YELLOW    Appearance CLEAR  CLEAR    Specific Gravity, Urine 1.020  1.005 - 1.030    pH 6.0  5.0 - 8.0    Glucose, UA NEGATIVE  NEGATIVE (mg/dL)   Hgb urine dipstick MODERATE (*) NEGATIVE    Bilirubin Urine NEGATIVE  NEGATIVE    Ketones, ur NEGATIVE  NEGATIVE (mg/dL)   Protein, ur NEGATIVE  NEGATIVE (mg/dL)   Urobilinogen, UA 0.2  0.0 - 1.0 (mg/dL)   Nitrite NEGATIVE  NEGATIVE    Leukocytes, UA NEGATIVE  NEGATIVE   URINE MICROSCOPIC-ADD ON      Component Value Range   Squamous Epithelial / LPF RARE  RARE    WBC, UA 0-2  <3 (WBC/hpf)   RBC / HPF 3-6  <3 (RBC/hpf)   Bacteria, UA RARE  RARE    Dg Ribs Unilateral W/chest Right  06/25/2011  *RADIOLOGY REPORT*  Clinical Data: Right lower rib pain.  RIGHT RIBS AND CHEST - 3+ VIEW  Comparison: 06/01/2011  Findings: Old fractures the right eighth, ninth, and tenth ribs noted with callus formation.  A right twelfth rib fracture was shown on the CT scan from 06/01/2011 and was acute or subacute at that time.  I do not discern a new rib fracture.  No pneumothorax or pleural effusion is observed. Cardiac and mediastinal contours appear unremarkable.  IMPRESSION:  1.  Old right lower rib fractures.  No acute right rib fracture is observed. Please note that nondisplaced rib fractures can be occult on conventional radiography. 2.  A right twelfth rib fracture was seen on the CT scan of 06/01/2011 and was acute at that time.  Original Report Authenticated By: Dellia Cloud, M.D.   Results for orders placed during the hospital encounter of 06/25/11   URINALYSIS, ROUTINE W REFLEX MICROSCOPIC      Component Value Range   Color, Urine YELLOW  YELLOW    Appearance CLEAR  CLEAR    Specific Gravity, Urine 1.020  1.005 - 1.030    pH 6.0  5.0 - 8.0  Glucose, UA NEGATIVE  NEGATIVE (mg/dL)   Hgb urine dipstick MODERATE (*) NEGATIVE    Bilirubin Urine NEGATIVE  NEGATIVE    Ketones, ur NEGATIVE  NEGATIVE (mg/dL)   Protein, ur NEGATIVE  NEGATIVE (mg/dL)   Urobilinogen, UA 0.2  0.0 - 1.0 (mg/dL)   Nitrite NEGATIVE  NEGATIVE    Leukocytes, UA NEGATIVE  NEGATIVE   URINE MICROSCOPIC-ADD ON      Component Value Range   Squamous Epithelial / LPF RARE  RARE    WBC, UA 0-2  <3 (WBC/hpf)   RBC / HPF 3-6  <3 (RBC/hpf)   Bacteria, UA RARE  RARE   I personally performed the services described in this documentation, which was scribed in my presence. The recorded information has been reviewed and considered. MDM:   Tests show a posterior right 10th rib fracture.  Also a small pleural effusion.  Can send home with meds for breakthrough pain. Will also prescribe Zithromax for potential early lung infection.  Donnetta Hutching, MD 06/25/11 2044

## 2011-06-25 NOTE — ED Notes (Signed)
Pt c/o severe pain onset approx 2 months ago. R sided pain located at mid back. Pt reports he thinks he may have broken rib. Says he was seen at [facility] and told he had a touch of pneumonia. Pt also reports hx of necrotizing fascitis 2 years ago. Large area of scarring to L hip. Open wound on L lat knee. No drainage noted at present.

## 2011-07-01 ENCOUNTER — Ambulatory Visit: Payer: Self-pay | Admitting: Pain Medicine

## 2011-07-08 ENCOUNTER — Encounter (HOSPITAL_COMMUNITY): Payer: Self-pay

## 2011-07-08 ENCOUNTER — Emergency Department (HOSPITAL_COMMUNITY)
Admission: EM | Admit: 2011-07-08 | Discharge: 2011-07-08 | Disposition: A | Discharge status: Home / Self Care | Payer: Medicaid Other | Attending: Emergency Medicine | Admitting: Emergency Medicine

## 2011-07-08 ENCOUNTER — Emergency Department (HOSPITAL_COMMUNITY): Payer: Medicaid Other

## 2011-07-08 DIAGNOSIS — R0602 Shortness of breath: Secondary | ICD-10-CM | POA: Insufficient documentation

## 2011-07-08 DIAGNOSIS — R05 Cough: Secondary | ICD-10-CM | POA: Insufficient documentation

## 2011-07-08 DIAGNOSIS — IMO0002 Reserved for concepts with insufficient information to code with codable children: Secondary | ICD-10-CM | POA: Insufficient documentation

## 2011-07-08 DIAGNOSIS — R0789 Other chest pain: Secondary | ICD-10-CM | POA: Insufficient documentation

## 2011-07-08 DIAGNOSIS — R079 Chest pain, unspecified: Secondary | ICD-10-CM | POA: Insufficient documentation

## 2011-07-08 DIAGNOSIS — I10 Essential (primary) hypertension: Secondary | ICD-10-CM | POA: Insufficient documentation

## 2011-07-08 DIAGNOSIS — F17200 Nicotine dependence, unspecified, uncomplicated: Secondary | ICD-10-CM | POA: Insufficient documentation

## 2011-07-08 DIAGNOSIS — R0781 Pleurodynia: Secondary | ICD-10-CM

## 2011-07-08 DIAGNOSIS — R059 Cough, unspecified: Secondary | ICD-10-CM | POA: Insufficient documentation

## 2011-07-08 MED ORDER — DIAZEPAM 5 MG PO TABS
5.0000 mg | ORAL_TABLET | Freq: Once | ORAL | Status: AC
  Administered 2011-07-08: 5 mg via ORAL
  Filled 2011-07-08: qty 1

## 2011-07-08 MED ORDER — OXYCODONE-ACETAMINOPHEN 5-325 MG PO TABS
1.0000 | ORAL_TABLET | Freq: Once | ORAL | Status: AC
  Administered 2011-07-08: 1 via ORAL
  Filled 2011-07-08: qty 1

## 2011-07-08 NOTE — ED Notes (Signed)
Pt states he waits to wait outside since he has been discharged. Pt taken out in wheelchair by pt advocate.

## 2011-07-08 NOTE — ED Notes (Signed)
Pt reports broke ribs 2 weeks ago getting out of his wheelchair and is still having pain in r posterior ribs.

## 2011-07-08 NOTE — ED Provider Notes (Signed)
History     CSN: [id number] Arrival date & time: 07/08/2011 10:11 AM   First MD Initiated Contact with Patient 07/08/11 1149      Chief Complaint  Patient presents with  . Rib Injury    (Consider location/radiation/quality/duration/timing/severity/associated sxs/prior treatment) HPI Comments: Pt states that a few months ago he sustained fractured ribs. He now states he feels worse. When nurses and MD and Midlevels pass by the room he beging to yell out in pain. Rewiew of the records show pt had several visits for right side pain and rib pain. He did not give this hx, but records reveal pt is involved with a pain management center. The administrator of this pain management center, during a previous visit,  suggested that the was given medication to last from appointment to appointment. Pt choices to take meds outside of his directions. No hemoptosis, No fever. No new injury.  Pt is treated with short acting and long acting narcotic. He states this is not helping.  The history is provided by the patient.    Past Medical History  Diagnosis Date  . DDD (degenerative disc disease)   . Necrotizing fasciitis   . Degenerative disc disease   . Hypertension   . Degenerative disc disease     Past Surgical History  Procedure Date  . Hip surgery   . Appendectomy   . Ankle surgery     No family history on file.  History  Substance Use Topics  . Smoking status: Current Everyday Smoker -- 1.0 packs/day for 36 years  . Smokeless tobacco: Former Neurosurgeon    Types: Chew  . Alcohol Use: No      Review of Systems  Constitutional: Negative for activity change.       All ROS Neg except as noted in HPI  HENT: Negative for nosebleeds and neck pain.   Eyes: Negative for photophobia and discharge.  Respiratory: Positive for cough, chest tightness and shortness of breath. Negative for wheezing.   Cardiovascular: Negative for chest pain and palpitations.       Chest wall pain.    Gastrointestinal: Negative for abdominal pain and blood in stool.  Genitourinary: Negative for dysuria, frequency and hematuria.  Musculoskeletal: Negative for back pain and arthralgias.  Skin: Negative.   Neurological: Negative for dizziness, seizures and speech difficulty.  Psychiatric/Behavioral: Negative for hallucinations and confusion.    Allergies  Review of patient's allergies indicates no known allergies.  Home Medications   Current Outpatient Rx  Name Route Sig Dispense Refill  . AMLODIPINE BESYLATE 5 MG PO TABS Oral Take 1 tablet (5 mg total) by mouth daily. 30 tablet 3  . CITALOPRAM HYDROBROMIDE 20 MG PO TABS Oral Take 1 tablet (20 mg total) by mouth daily. 30 tablet 3  . METOPROLOL TARTRATE 25 MG PO TABS Oral Take 1 tablet (25 mg total) by mouth 2 (two) times daily. 60 tablet 3  . OXYCODONE-ACETAMINOPHEN 7.5-325 MG PO TABS Oral Take 1 tablet by mouth 2 (two) times daily. As needed for breakthrough pain 60 tablet 0  . TAMSULOSIN HCL 0.4 MG PO CAPS Oral Take 1 capsule (0.4 mg total) by mouth at bedtime. 60 capsule 3  . IBUPROFEN 800 MG PO TABS Oral Take 800 mg by mouth every 8 (eight) hours as needed.      . OXYCODONE HCL 30 MG PO TB12 Oral Take 1 tablet (30 mg total) by mouth 3 (three) times daily. 90 tablet 0    BP 125/67  Pulse 65  Temp(Src) 97.9 F (36.6 C) (Oral)  Resp 20  Ht 6\' 4"  (1.93 m)  Wt 225 lb (102.059 kg)  BMI 27.39 kg/m2  SpO2 98%  Physical Exam  Nursing note and vitals reviewed. Constitutional: He is oriented to person, place, and time. He appears well-developed and well-nourished.  Non-toxic appearance.  HENT:  Head: Normocephalic.  Right Ear: Tympanic membrane and external ear normal.  Left Ear: Tympanic membrane and external ear normal.  Eyes: EOM and lids are normal. Pupils are equal, round, and reactive to light.  Neck: Normal range of motion. Neck supple. Carotid bruit is not present.  Cardiovascular: Normal rate, regular rhythm, normal  heart sounds, intact distal pulses and normal pulses.   Pulmonary/Chest: No respiratory distress. He has rhonchi.       Right chest wall pain to palpation.  Abdominal: Soft. Bowel sounds are normal. There is no tenderness. There is no guarding.  Musculoskeletal: Normal range of motion.  Lymphadenopathy:       Head (right side): No submandibular adenopathy present.       Head (left side): No submandibular adenopathy present.    He has no cervical adenopathy.  Neurological: He is alert and oriented to person, place, and time. He has normal strength. No cranial nerve deficit or sensory deficit.  Skin: Skin is warm and dry.  Psychiatric: His speech is normal. His mood appears anxious. He is agitated.    ED Course  Procedures (including critical care time)  Labs Reviewed - No data to display Dg Ribs Unilateral W/chest Right  07/08/2011  *RADIOLOGY REPORT*  Clinical Data: Right rib fracture.  Rib pain.  RIGHT RIBS AND CHEST - 3+ VIEW  Comparison: Multiple exams, including 06/25/2011 and 06/01/2011  Findings: The minimal blunting of the right to costophrenic angle likely reflects a trace right pleural effusion.  No pneumothorax is observed.  The healing right tenth, eleventh, and twelfth rib fractures noted. There are also old right lower lateral rib fractures.  The right posterolateral 3rd rib fracture is age indeterminate.  IMPRESSION:  1.  Trace right pleural effusion. 2.  Healing right tenth, eleventh, and twelfth rib fractures. 3.  Age indeterminate but possibly subacute right lateral 3rd rib fracture.  Original Report Authenticated By: Dellia Cloud, M.D.     1. Rib pain       MDM  Today's chest xray reviewed. Previous ED visits reviewed.  No new injuries. Safe for patient to go home. He is to follow up with his pain management MD. No new Rx given today.        Kathie Dike, [place] 07/08/11 [phone]

## 2011-07-08 NOTE — ED Notes (Signed)
Pt yelling and cursing at staff. Rn, charge rn, Games developer at bedside. Law enforcement also called to bedside. Pt c/o not receiving pain medication. Pt told by rn he had received percocet and valium and edpa awaiting xray results. Pt continues to curse staff. Law enforcement remains at bedside. edpa at bedside to give results of xray-no new injury, ribs healing. Pt instructed to follow up with pcp for long term pain management. Discharge paperwork given. Pt refuses to sign d/c. Pt dressed and awaiting transportation.

## 2011-07-09 NOTE — ED Provider Notes (Signed)
Medical screening examination/treatment/procedure(s) were conducted as a shared visit with non-physician practitioner(s) and myself.  I personally evaluated the patient during the encounter.  Seen by me. No acute chest syndrome.  Patient unhappy with pain management  Donnetta Hutching, MD 07/09/11 [phone]

## 2011-07-13 ENCOUNTER — Encounter: Payer: Self-pay | Admitting: Family Medicine

## 2011-07-13 ENCOUNTER — Ambulatory Visit (INDEPENDENT_AMBULATORY_CARE_PROVIDER_SITE_OTHER): Payer: Medicaid Other | Admitting: Family Medicine

## 2011-07-13 VITALS — BP 130/84 | HR 57

## 2011-07-13 DIAGNOSIS — R635 Abnormal weight gain: Secondary | ICD-10-CM

## 2011-07-13 DIAGNOSIS — F341 Dysthymic disorder: Secondary | ICD-10-CM

## 2011-07-13 DIAGNOSIS — F1110 Opioid abuse, uncomplicated: Secondary | ICD-10-CM

## 2011-07-13 DIAGNOSIS — F1190 Opioid use, unspecified, uncomplicated: Secondary | ICD-10-CM

## 2011-07-13 DIAGNOSIS — M549 Dorsalgia, unspecified: Secondary | ICD-10-CM

## 2011-07-13 MED ORDER — OXYCODONE HCL 30 MG PO TB12: 30.0000 mg | ORAL_TABLET | Freq: Three times a day (TID) | ORAL | Status: DC

## 2011-07-13 MED ORDER — OXYCODONE-ACETAMINOPHEN 7.5-325 MG PO TABS: 1.0000 | ORAL_TABLET | Freq: Three times a day (TID) | ORAL | Status: DC | PRN

## 2011-07-13 MED ORDER — FENTANYL 50 MCG/HR TD PT72: 1.0000 | MEDICATED_PATCH | TRANSDERMAL | Status: DC

## 2011-07-13 NOTE — Progress Notes (Signed)
  Subjective:    Patient ID: Jeffrey [name], male    DOB: 1956-08-11, 55 y.o.   MRN: [id number]  HPI  Patient presents to clinic for followup chronic pain. This is been going on for over 5 years per patient he has had lower back pain and bilateral knee pain but this has been getting progressively worse. He currently takes the OxyContin 30 mg 3 times a day and Percocet one tablet twice a day. Patient says that he was kicked out of the last nursing home because he was in too much pain. He currently resides at [facility], but they wanted patient to see his primary care provider for better pain management. Patient was seen in the pain clinic in [place] and patient was told there is nothing else they can do. He needs to see a Careers adviser at [facility]. A referral is in process. Patient is asking for a higher dose of OxyContin.  The patient also states that the Celexa is not working anymore. He has stopped taking it. When asked if he would like to start Cymbalta for both depression and pain management, patient politely declined.    Past medical history:I have reviewed and confirmed the past medical history in the chart. Medications: reviewed medication list in the chart Allergies: reviewed allergy section in the chart Review of Systems: Negative for chest pain and shortness of breath  Review of Systems  Denies fevers, chills, night sweats, chest pain, abdominal pain. Denies any nausea or vomiting. Denies any drowsiness or shallow breathing after taking chronic narcotic medications.    Objective:   Physical Exam  Constitutional: No distress.       Wheel-chair bound  Neck: Normal range of motion. Neck supple.  Musculoskeletal:       Right knee: Normal.       Left knee: He exhibits normal range of motion, no swelling, no effusion, no laceration and no erythema.  Skin: Skin is warm and dry.  Psychiatric: His speech is normal. His mood appears anxious. He is agitated.            Assessment & Plan:

## 2011-07-13 NOTE — Assessment & Plan Note (Signed)
Discontinue patient's Celexa per patient's request. Offered Cymbalta is a better medication to treat both depression and chronic pain. Patient declined Cymbalta-he says he is not depressed and will not fill the prescription.

## 2011-07-13 NOTE — Assessment & Plan Note (Signed)
Will add fentanyl patch 50 every 72 hours as adjunctive therapy for chronic osteoarthritis. We'll continue current dose of OxyContin. Will give Percocet every 6 hours as needed for breakthrough pain. Will followup with [facility] referral for orthopedic surgery and chronic pain management. Patient to followup with PCP in 4 weeks.

## 2011-07-13 NOTE — Patient Instructions (Signed)
Please use Fentanyl patch as directed. Schedule a follow up appointment with me in 4 to 6 weeks. Thank you.

## 2011-07-13 NOTE — Assessment & Plan Note (Signed)
Will not increase patient's OxyContin today. Both Dr. Sheffield Slider and I spoke with patient and discussed the use of chronic narcotics for chronic pain. Will add fentanyl patch every 72 hours as needed for pain Patient to followup in 4 weeks with Dr. Domenick Bookbinder.

## 2011-07-27 ENCOUNTER — Telehealth: Payer: Self-pay | Admitting: Family Medicine

## 2011-07-27 NOTE — Telephone Encounter (Signed)
Wrote Rx for elevating leg rests for wheelchair and will be faxed to the [employer].

## 2011-08-03 ENCOUNTER — Other Ambulatory Visit: Payer: Self-pay | Admitting: Family Medicine

## 2011-08-03 MED ORDER — FENTANYL 50 MCG/HR TD PT72: 1.0000 | MEDICATED_PATCH | TRANSDERMAL | Status: DC

## 2011-08-03 NOTE — Telephone Encounter (Signed)
Requesting rx for fentanyl, just put on last patch, would like to pick up rx today.

## 2011-08-03 NOTE — Telephone Encounter (Signed)
Dr. Tye Savoy printed and placed up front for pick up

## 2011-08-10 ENCOUNTER — Encounter: Payer: Self-pay | Admitting: Family Medicine

## 2011-08-10 ENCOUNTER — Ambulatory Visit (INDEPENDENT_AMBULATORY_CARE_PROVIDER_SITE_OTHER): Payer: Medicaid Other | Admitting: Family Medicine

## 2011-08-10 DIAGNOSIS — M549 Dorsalgia, unspecified: Secondary | ICD-10-CM

## 2011-08-10 MED ORDER — OXYCODONE-ACETAMINOPHEN 7.5-325 MG PO TABS: 1.0000 | ORAL_TABLET | Freq: Four times a day (QID) | ORAL | Status: DC | PRN

## 2011-08-10 MED ORDER — FENTANYL 75 MCG/HR TD PT72: 1.0000 | MEDICATED_PATCH | TRANSDERMAL | Status: DC

## 2011-08-10 NOTE — Patient Instructions (Signed)
Stop taking Oxycontin 30mg . Increase Tamsulosin to 2 tablets at bedtime. Fentanyl increased to 75 mcg every 3 days. Follow up with PCP in 3-4 months.

## 2011-08-10 NOTE — Progress Notes (Signed)
  Subjective:    Patient ID: Jeffrey [name], male    DOB: [birth year], 55 y.o.   MRN: [id number]  HPI  The patient presents to clinic for followup chronic pain. Pain is mostly located at lower back and bilateral lower extremities. At last appointment, started a fentanyl patch 50 mcg every 72 hours. Patient was also taking Percocet 7.5-325 one tablet every 8 hours as needed for pain and oxycodone 30 mg one tablet by mouth 3 times a day. Patient states that pain has improved after using the patch. He is able to sleep at night and perform ADLs without difficulty. However, he wishes to change Percocet order to every 6 hours.   Patient denies any fever, chills, night sweats. He denies any fatigue, difficulty breathing, respiratory depression after taking narcotics. His medications are given to him by a medical technician daily at [facility].  I reviewed patient's past medical history, social history, medications, allergies, medications.  Review of Systems Per history of present illness    Objective:   Physical Exam  Constitutional: He is oriented to person, place, and time. No distress.  Cardiovascular: Intact distal pulses.  Exam reveals no gallop and no friction rub.   No murmur heard.      Distant heart sounds  Pulmonary/Chest: Effort normal and breath sounds normal. No respiratory distress. He has no wheezes. He has no rales.  Musculoskeletal:       Wheel-chair bound  Neurological: He is alert and oriented to person, place, and time.  Psychiatric:       Mood normal, however becomes aggressive when discussing pain regimen          Assessment & Plan:

## 2011-08-10 NOTE — Assessment & Plan Note (Signed)
Discussed case with Dr. Sheffield Slider, he noticed patient well. Discontinued oxycodone 30 mg now the patient is on fentanyl. Increase fentanyl to 75 mcg every 72 hours. Increased Percocet to every 6 hours as needed for pain. Wrote an order for home health nurse to assess the vitals and O2 sat every week. Discussed with patient the risks of taking multiple narcotics. Patient agrees and understands the plan. Patient to followup in 4 weeks.

## 2011-08-20 ENCOUNTER — Other Ambulatory Visit: Payer: Self-pay | Admitting: Family Medicine

## 2011-08-20 NOTE — Telephone Encounter (Signed)
Spoke with Meriam Sprague and told her that it will inform Dr.de la Sondra Come that he is out of his patches and expressed again to her that we will need more turn around time to be able to better take care of what her client is in need of if we are given this time. She understood and will comply in the future. She also wanted to tell me about Mr. Sackmann attitude. She told me that he is "not nice" he curses out the staff and is very hateful. Laureen Ochs, Viann Shove

## 2011-08-20 NOTE — Telephone Encounter (Signed)
Calling about pts fentanyl patches, says pt just placed the last one & will need another one for Monday, explained to [facility] we must have at least a 24-48 hr notice since our doctors are not here every day, she insisted on either getting an rx today or bringing pt for an appt on Monday to get them. Advised RN would call back to see what we can do.

## 2011-08-23 ENCOUNTER — Encounter: Payer: Self-pay | Admitting: Family Medicine

## 2011-08-23 ENCOUNTER — Ambulatory Visit (INDEPENDENT_AMBULATORY_CARE_PROVIDER_SITE_OTHER): Payer: Medicaid Other | Admitting: Family Medicine

## 2011-08-23 ENCOUNTER — Other Ambulatory Visit: Payer: Self-pay | Admitting: Family Medicine

## 2011-08-23 DIAGNOSIS — M549 Dorsalgia, unspecified: Secondary | ICD-10-CM

## 2011-08-23 DIAGNOSIS — G894 Chronic pain syndrome: Secondary | ICD-10-CM

## 2011-08-23 MED ORDER — OXYCODONE HCL 30 MG PO TB12: 30.0000 mg | ORAL_TABLET | Freq: Three times a day (TID) | ORAL | Status: DC

## 2011-08-23 MED ORDER — FENTANYL 75 MCG/HR TD PT72: 1.0000 | MEDICATED_PATCH | TRANSDERMAL | Status: DC

## 2011-08-23 MED ORDER — OXYCODONE-ACETAMINOPHEN 7.5-325 MG PO TABS: 1.0000 | ORAL_TABLET | Freq: Four times a day (QID) | ORAL | Status: DC | PRN

## 2011-08-23 NOTE — Assessment & Plan Note (Signed)
Restart oxycodone 30mg  TID for pain with Percocet PRN for breakthrough. Referral to Pain clinic.  Follow up in 3 months.

## 2011-08-23 NOTE — Progress Notes (Signed)
  Subjective:    Patient ID: Jeffrey [name], male    DOB: December 07, 1955, 55 y.o.   MRN: [id number]  HPI Patient presents to clinic for followup chronic pain. About 2 months ago, pain regimen was changed from oxycodone 30 mg 3 times a day 2 fentanyl patch 75 MCG's every 3 days. This is working well for patient up until last week. Patient says that he cannot wait 3 days for a new patch. He would like to stop the patch and restart oxycodone 30 mg.  Patient also has a healing wound on his left lateral knee. He says that it is healing well, but he continues to scratch it which delays healing process. Patient would like to have his wound wrapped today and an order for his nurse to change dressings every 3 days.   Review of Systems Endorses chronic knee and back pain. Denies any urinary or bowel incontinence. Denies fever, chills night sweats, nausea or vomiting.    Objective:   Physical Exam  Constitutional: No distress.  Skin:       Left lateral knee, healing wound, 3 cm long x 1 cm wide, no active bleeding or purulent drainage, mild erythema, dry skin surrounding wound, no tenderness on palpation  Psychiatric: His mood appears anxious. He is agitated and aggressive.          Assessment & Plan:

## 2011-08-23 NOTE — Patient Instructions (Signed)
Discontinue fentanyl patches. Start oxycodone 30 mg one tablet by mouth 3 times daily. Give Percocet one tablet every 6 hours as needed for pain may give 2 tablets at bedtime. Will refer to pain clinic. Followup in 3 months or sooner as necessary.

## 2011-08-24 ENCOUNTER — Telehealth: Payer: Self-pay | Admitting: Family Medicine

## 2011-08-24 NOTE — Telephone Encounter (Signed)
Ms. Jeffrey [name] called to say that prescription did not match the order written for med to be taking.  Please call her for info.

## 2011-08-24 NOTE — Telephone Encounter (Signed)
Spoke to pharmacy and clarified orders.

## 2011-09-20 ENCOUNTER — Telehealth: Payer: Self-pay | Admitting: Family Medicine

## 2011-09-20 MED ORDER — OXYCODONE HCL 30 MG PO TB12: 30.0000 mg | ORAL_TABLET | Freq: Three times a day (TID) | ORAL | Status: DC

## 2011-09-20 NOTE — Telephone Encounter (Signed)
Will route to PCP for refill request of oxycontin.  Gaylene Brooks, RN

## 2011-09-20 NOTE — Telephone Encounter (Signed)
Needs a refill on his oxycontin

## 2011-09-20 NOTE — Telephone Encounter (Signed)
Called and informed Jeffrey [name] that Rx ready to pick up at front desk.  Gaylene Brooks, RN

## 2011-09-24 ENCOUNTER — Ambulatory Visit: Payer: Medicaid Other | Admitting: Physical Medicine & Rehabilitation

## 2011-09-26 ENCOUNTER — Encounter (HOSPITAL_COMMUNITY): Payer: Self-pay | Admitting: *Deleted

## 2011-09-26 ENCOUNTER — Emergency Department (HOSPITAL_COMMUNITY)
Admission: EM | Admit: 2011-09-26 | Discharge: 2011-09-26 | Disposition: A | Discharge status: Home / Self Care | Payer: Medicaid Other | Attending: Emergency Medicine | Admitting: Emergency Medicine

## 2011-09-26 DIAGNOSIS — IMO0002 Reserved for concepts with insufficient information to code with codable children: Secondary | ICD-10-CM | POA: Insufficient documentation

## 2011-09-26 DIAGNOSIS — I10 Essential (primary) hypertension: Secondary | ICD-10-CM | POA: Insufficient documentation

## 2011-09-26 DIAGNOSIS — Z87891 Personal history of nicotine dependence: Secondary | ICD-10-CM | POA: Insufficient documentation

## 2011-09-26 DIAGNOSIS — M25569 Pain in unspecified knee: Secondary | ICD-10-CM | POA: Insufficient documentation

## 2011-09-26 DIAGNOSIS — M545 Low back pain, unspecified: Secondary | ICD-10-CM | POA: Insufficient documentation

## 2011-09-26 DIAGNOSIS — Z9889 Other specified postprocedural states: Secondary | ICD-10-CM | POA: Insufficient documentation

## 2011-09-26 DIAGNOSIS — G8929 Other chronic pain: Secondary | ICD-10-CM

## 2011-09-26 MED ORDER — ONDANSETRON 8 MG PO TBDP
8.0000 mg | ORAL_TABLET | ORAL | Status: AC
  Administered 2011-09-26: 8 mg via ORAL
  Filled 2011-09-26: qty 1

## 2011-09-26 MED ORDER — HYDROMORPHONE HCL PF 2 MG/ML IJ SOLN
2.0000 mg | Freq: Once | INTRAMUSCULAR | Status: AC
  Administered 2011-09-26: 2 mg via INTRAMUSCULAR
  Filled 2011-09-26: qty 1

## 2011-09-26 NOTE — ED Notes (Signed)
Pt c/o back spasms for 2 yrs. Pt states they are worse tonight.

## 2011-09-26 NOTE — ED Notes (Signed)
Patient is resting but making moaning sounds like he is in pain. RN Tiffany made aware.

## 2011-09-26 NOTE — ED Notes (Signed)
[name] called at [phone] and there was no answer.  Message left for them to contact us to arrange discharge of one of their residents.

## 2011-09-26 NOTE — ED Provider Notes (Signed)
History     CSN: [id number]  Arrival date & time 09/26/11  7829   First MD Initiated Contact with Patient 09/26/11 0330      Chief Complaint  Patient presents with  . Spasms    (Consider location/radiation/quality/duration/timing/severity/associated sxs/prior treatment) HPI Patient with a history of chronic back and leg pain presenting with worsening pain. He states the pain feels like muscle spasms and has been worse over the past 4 days. Pain is in his low back right knee and hip primarily. He has had no fever or chills. He states he is a patient at the pain management clinic and has an appointment in 2 days. He takes OxyContin and oxycodone for breakthrough pain. He states he has run out of his breakthrough medication. He denies any urinary retention, no incontinence of bowel or bladder, no weakness in his legs. He states the pain is similar to his prior pain. There've been no new injuries or trauma. Movement and palpation makes the pain worse. There no other alleviating or modifying factors. There no associated systemic symptoms.  Past Medical History  Diagnosis Date  . DDD (degenerative disc disease)   . Necrotizing fasciitis   . Degenerative disc disease   . Hypertension   . Degenerative disc disease     Past Surgical History  Procedure Date  . Hip surgery   . Appendectomy   . Ankle surgery     History reviewed. No pertinent family history.  History  Substance Use Topics  . Smoking status: Former Smoker -- 1.0 packs/day for 36 years    Quit date: 07/27/2011  . Smokeless tobacco: Former Neurosurgeon    Types: Chew  . Alcohol Use: No      Review of Systems ROS reviewed and otherwise negative except for mentioned in HPI  Allergies  Review of patient's allergies indicates no known allergies.  Home Medications   Current Outpatient Rx  Name Route Sig Dispense Refill  . AMLODIPINE BESYLATE 5 MG PO TABS Oral Take 1 tablet (5 mg total) by mouth daily. 30 tablet 3  .  CITALOPRAM HYDROBROMIDE 20 MG PO TABS Oral Take 1 tablet (20 mg total) by mouth daily. 30 tablet 3  . LEVOTHYROXINE SODIUM 137 MCG PO TABS Oral Take 137 mcg by mouth daily.    Marland Kitchen LORATADINE 10 MG PO TABS Oral Take 10 mg by mouth daily.    Marland Kitchen METOPROLOL TARTRATE 25 MG PO TABS Oral Take 1 tablet (25 mg total) by mouth 2 (two) times daily. 60 tablet 3  . OXYCODONE HCL ER 30 MG PO TB12 Oral Take 1 tablet (30 mg total) by mouth 3 (three) times daily. 15 tablet 0    Take 1 tablet three times daily for 5 days until y ...  . OXYCODONE-ACETAMINOPHEN 7.5-325 MG PO TABS Oral Take 1 tablet by mouth every 6 (six) hours as needed for pain. As needed for breakthrough pain 120 tablet 0  . TAMSULOSIN HCL 0.4 MG PO CAPS Oral Take 0.8 mg by mouth at bedtime.      BP 140/92  Pulse 58  Temp(Src) 97.7 F (36.5 C) (Oral)  Resp 20  Ht 6\' 4"  (1.93 m)  Wt 226 lb (102.513 kg)  BMI 27.51 kg/m2  SpO2 96% Vitals reviewed Physical Exam Physical Examination: General appearance - alert, uncomfortable appearing, but in no acute distress Mental status - alert, oriented to person, place, and time Eyes - pupils equal and reactive, extraocular eye movements intact Chest - clear to auscultation,  no wheezes, rales or rhonchi, symmetric air entry Heart - normal rate, regular rhythm, normal S1, S2, no murmurs, rubs, clicks or gallops Abdomen - soft, nontender, nondistended, no masses or organomegaly Back exam - full range of motion, no tenderness, palpable spasm or pain on motion Neurological - alert, oriented, normal speech, strength symmetric in lower extremities bilaterally Musculoskeletal - no joint tenderness, deformity or swelling Extremities - peripheral pulses normal, no pedal edema, no clubbing or cyanosis Skin - normal coloration and turgor, no rashes Psych- tearful, angry, no hallucination or delusional thinking evident  ED Course  Procedures (including critical care time)  Labs Reviewed - No data to display No  results found.   1. Chronic pain       MDM  Patient with chronic back and leg pain presenting with worsening pain. He was given analgesic medication in the ED period per chart review his OxyContin was prescribed on January 14. He has appointment with pain management and 2 days. There is no acute change in symptoms or any signs or symptoms of cauda equina or other acute emergency condition tonight. Patient strongly encouraged to keep appointment with pain management. He was advised that we would not be giving him a prescription for pain medication but had treated his pain in the emergency department. He was given strict return precautions.        Ethelda Chick, MD 09/26/11 [phone]

## 2011-09-26 NOTE — ED Notes (Signed)
Attempted to call [facility] for pt d/c. Answering machine-message left previously.

## 2011-09-27 ENCOUNTER — Ambulatory Visit: Payer: Medicaid Other | Admitting: Physical Medicine & Rehabilitation

## 2011-09-27 ENCOUNTER — Telehealth: Payer: Self-pay | Admitting: Family Medicine

## 2011-09-27 ENCOUNTER — Encounter: Payer: Medicaid Other | Attending: Physical Medicine & Rehabilitation

## 2011-09-27 DIAGNOSIS — M25559 Pain in unspecified hip: Secondary | ICD-10-CM | POA: Insufficient documentation

## 2011-09-27 DIAGNOSIS — M1710 Unilateral primary osteoarthritis, unspecified knee: Secondary | ICD-10-CM

## 2011-09-27 DIAGNOSIS — M799 Soft tissue disorder, unspecified: Secondary | ICD-10-CM | POA: Insufficient documentation

## 2011-09-27 DIAGNOSIS — G8929 Other chronic pain: Secondary | ICD-10-CM | POA: Insufficient documentation

## 2011-09-27 DIAGNOSIS — M1610 Unilateral primary osteoarthritis, unspecified hip: Secondary | ICD-10-CM

## 2011-09-27 DIAGNOSIS — M24459 Recurrent dislocation, unspecified hip: Secondary | ICD-10-CM | POA: Insufficient documentation

## 2011-09-27 DIAGNOSIS — M48061 Spinal stenosis, lumbar region without neurogenic claudication: Secondary | ICD-10-CM

## 2011-09-27 NOTE — Telephone Encounter (Signed)
Facility is requesting hard copies of all of patients meds due to this being the first time thru their pharmacy that medication would be dispensed.  Also his oxycottin rx is needing refills.  Will pick up hard copy of that rx as well.  Please call when they are ready.

## 2011-09-28 ENCOUNTER — Telehealth: Payer: Self-pay | Admitting: Family Medicine

## 2011-09-28 ENCOUNTER — Telehealth: Payer: Self-pay | Admitting: *Deleted

## 2011-09-28 NOTE — Progress Notes (Signed)
REASON FOR REFERRAL:  Chronic pain.  This is a 56 year old male with chief complaint of left hip pain.  I do not have notes from outside the [facility].  I was able to review x-rays, both actual films on canopy packs as well as reports. [name] has a history of hip pain of many year duration.  He states that he has seen 4 different orthopedic surgeons and was told that he was not a surgical candidate.  However, I do not have any of these notes.  He has been living in a rest home.  He states that he is allowed to leave the premises on his own free will.  He has been there for a undisclosed amount of time.  Prior to that, he was at [facility].  He has a history of multiple psychiatric admissions for alcohol-induced psychosis, alcohol abuse, cocaine abuse, problems within his group home.  He also has a history of benzodiazepine abuse.  He has had involuntary admissions to Psych Unit at [facility] for suicidal attempts.  He has also had involuntary admissions. He has had admission after the patient threatened to overdose on medication.  Looking at his imaging studies, he has evidence of lumbar stenosis, as well as lumbar degenerative disk at L4-5, L5-S1 levels.  In terms of his hip, he has had an MRI demonstrating bilateral labral degeneration, femoral acetabular impingement that was in 2007. Additional imaging studies reviewed include CT of the abdomen and pelvis, demonstrating the chronically dislocated left hip. He had a hospital admission for necrotizing fasciitis approximately 2 years ago, had involvement of the left lower extremity, extensive scarring of the gluteus region, extensive scarring at the lateral leg area on the left side.  He complains of poor functional abilities.  He states he has never had any physical therapy.  In terms of injections, he has had a knee injection which was helpful, but has not had any hip or spine  injections.  His last psych referral indicates a desire to overdose on medications in May 2012, due to some social stressors.  His last hospital admission was for urosepsis.  . Past Surgical hx:His operative reports indicate hematoma, left hip wound, status post irrigation and open left hip, left leg,  debridement 20-cm length incision x2. His necrotizing fasciitis was in 2010, had a wound VAC complex closure  PHYSICAL EXAMINATION:  The patient has a large gluteal incision with evidence of left hip contracture and flexion contracture.  His left lateral leg shows evidence of extensive scarring from what looks to be a fasciotomy.  His upper extremity strength is normal.  His mood and affect are agitated.  He is oriented.  He can answer questions.  He disputes having cirrhosis.  His lower extremity strength on the right side is normal.  Left knee has -10 from full extension.  He does have some crepitus in left knee.  Ankle range of motion is full.  Sensation is reduced in the left L4 and S1 and right L5.  He is able to stand independently, but bears most of his weight on his right lower extremity.  IMPRESSION: 1. Left hip pain, multifactorial, chronic dislocation, chronic soft     tissue defect. 2. Left knee osteoarthritis. 3. Lumbar stenosis but no overt signs of radiculitis other than some     sensory changes.  RECOMMENDATIONS: 1. Given his complaints of decreased functional status, I do think PT,     OT evals would be  helpful to look at assisted devices and other     self-care skills.  He declines this recommendation. 2. We will inject left knee today with Depo-Medrol and lidocaine. 3. Recommend referral to orthopedics to eval left knee OA. 4. In terms of his pain medications, he is a very high risk     individual.  I really would not feel comfortable making any changes     to his medication regimen.  He is already on high dose narcotic     analgesics, and it is questionable  whether further raising this     would be even of any benefit and certainly would increase     complication for overdosed risk.  He would be more appropriately     managed at a [facility] such as [facility] where     there is addiction medicine psychiatry and pain management under     one roof. 5. I do not plan to follow the patient.     Erick Colace, M.D. Electronically Signed    AEK/MedQ D:  09/27/2011 15:10:43  T:  09/27/2011 21:20:53  Job #:  [id number]

## 2011-09-28 NOTE — Telephone Encounter (Signed)
Please return patient's number regarding pain clinic and med.

## 2011-09-28 NOTE — Telephone Encounter (Signed)
Spoke with patient about his pain medication. He states that he has been out of medication for 1 day now. Patient was informed at last visit that once he was sent to a pain clinic he would not be able to receive narcotics from [place] due to [facility]. He stated that they said there was nothing they could do for him and that they did a knee injection and let him go. I informed him that we will need confirmation that they will not see him again before we will prescribe any pain medication here.

## 2011-09-28 NOTE — Telephone Encounter (Signed)
Spoke with patient about this issue

## 2011-09-29 ENCOUNTER — Encounter: Payer: Self-pay | Admitting: Family Medicine

## 2011-09-29 ENCOUNTER — Ambulatory Visit (INDEPENDENT_AMBULATORY_CARE_PROVIDER_SITE_OTHER): Payer: Medicaid Other | Admitting: Family Medicine

## 2011-09-29 DIAGNOSIS — K7460 Unspecified cirrhosis of liver: Secondary | ICD-10-CM

## 2011-09-29 DIAGNOSIS — E039 Hypothyroidism, unspecified: Secondary | ICD-10-CM

## 2011-09-29 DIAGNOSIS — M549 Dorsalgia, unspecified: Secondary | ICD-10-CM

## 2011-09-29 DIAGNOSIS — F1190 Opioid use, unspecified, uncomplicated: Secondary | ICD-10-CM

## 2011-09-29 DIAGNOSIS — F1110 Opioid abuse, uncomplicated: Secondary | ICD-10-CM

## 2011-09-29 MED ORDER — OXYCODONE HCL 40 MG PO TB12: 40.0000 mg | ORAL_TABLET | Freq: Two times a day (BID) | ORAL | Status: DC

## 2011-09-29 MED ORDER — OXYCODONE HCL 30 MG PO TB12: 30.0000 mg | ORAL_TABLET | Freq: Three times a day (TID) | ORAL | Status: DC

## 2011-09-29 NOTE — Patient Instructions (Signed)
I will refer you to the [facility]. Our office will call you with time and date. Stop taking Oxycontin 30 mg and Percocet. Start taking Oxycontin 40 mg by mouth twice a day as needed for pain. I will refill these medications until you are seen by [facility].

## 2011-09-29 NOTE — Assessment & Plan Note (Addendum)
Attempted to start Oxycontin 40mg  BID until seen by Pain Clinic at [facility] However, Medicaid needs prior authorization before patient can fill Rx. Patient called and was upset because he has been out of pain medication for 3 days. Spoke to pharmacy ([facility]) and gave verbal orders to DISCONTINUE Oxycontin 40 mg. Faxed a new Rx for Oxycontin 30mg  PO TID to [facility] today.  A rep will pick up hard copy tomorrow. Discontinue Percocet today.

## 2011-09-29 NOTE — Assessment & Plan Note (Signed)
Discussed with patient my discomfort in treating his pain with high dose narcotics given his substance abuse hx. Will refer to [facility]. Will give medication until seen by [facility]. Discussed with patient and he understands plan.

## 2011-09-29 NOTE — Progress Notes (Signed)
  Subjective:    Patient ID: Jeffrey [name], male    DOB: 09/27/1955, 56 y.o.   MRN: [id number]  HPI Patient here for refill narcotics.  He is currently taking Oxycontin 30 mg 3 times a day and Percocet one to 2 tablets every 6 hours as needed for breakthrough pain. Patient says OxyContin last about 2 hours and then pain returns.  Percocet is not relieving pain anymore. Patient was seen by Dr. Larna Daughters, pain clinic. Please refer to scanned documentation. In summary, pain clinic felt that patient should be seen by tertiary care center at [facility] pain clinic and recommended referral to orthopedic surgery. He received a knee injection at that visit and was offered PT but patient declined.    Patient has chronic pain, but worst pain is located at left knee. Patient is in a wheelchair and lives at [facility]. Patient is trying to move out and live independently. Medications are administered via nurse tech. Patient denies any constipation, headache, nausea or vomiting, difficulty breathing.  Review of Systems Per history of present illness     Objective:   Physical Exam  Constitutional: No distress.  Musculoskeletal:       Left knee: He exhibits decreased range of motion and erythema. He exhibits no swelling.       No TTP of bilateral knees.  Healing scar on lateral left knee, scabs present, but no active bleeding or pus.  Psychiatric: His speech is normal. His affect is angry. He is agitated and aggressive. He exhibits a depressed mood.          Assessment & Plan:

## 2011-09-30 ENCOUNTER — Telehealth: Payer: Self-pay | Admitting: *Deleted

## 2011-09-30 LAB — CBC
MCH: 27.6 pg (ref 26.0–34.0)
MCHC: 32.3 g/dL (ref 30.0–36.0)
MCV: 85.2 fL (ref 78.0–100.0)
Platelets: 315 10*3/uL (ref 150–400)
RDW: 14.1 % (ref 11.5–15.5)

## 2011-09-30 LAB — COMPREHENSIVE METABOLIC PANEL
ALT: 10 U/L (ref 0–53)
AST: 16 U/L (ref 0–37)
BUN: 21 mg/dL (ref 6–23)
Creat: 0.84 mg/dL (ref 0.50–1.35)
Total Bilirubin: 0.3 mg/dL (ref 0.3–1.2)

## 2011-09-30 LAB — HEPATITIS PANEL, ACUTE
HCV Ab: REACTIVE — AB
Hep A IgM: NEGATIVE
Hep B C IgM: NEGATIVE

## 2011-09-30 LAB — TSH: TSH: 1.043 u[IU]/mL (ref 0.350–4.500)

## 2011-09-30 NOTE — Telephone Encounter (Signed)
Spoke with Angelique Blonder to clarify message. Patient stated to her because  the oxycontin was requiring PA he wants to restart PRN pain medication instead . Will forward to MD.

## 2011-09-30 NOTE — Telephone Encounter (Signed)
Pt called and is also asking for his PRN pain meds back also.

## 2011-09-30 NOTE — Telephone Encounter (Signed)
PA required for Oxycontin. Form placed in MD box. 

## 2011-10-03 ENCOUNTER — Encounter: Payer: Self-pay | Admitting: Family Medicine

## 2011-10-04 ENCOUNTER — Telehealth: Payer: Self-pay | Admitting: Family Medicine

## 2011-10-04 MED ORDER — OXYCODONE-ACETAMINOPHEN 7.5-325 MG PO TABS: 1.0000 | ORAL_TABLET | Freq: Four times a day (QID) | ORAL | Status: DC | PRN

## 2011-10-04 NOTE — Telephone Encounter (Signed)
Spoke with April  @ [employer].  Patient called our office himself requesting refill of Percocet.  Patient has Oxycontin #76 tabs left from his office visit last week and does not need any meds refilled at this time.  Oxycodone Rx written today by Dr. Tye Savoy voided and shredded and PCP aware.  Gaylene Brooks, RN

## 2011-10-04 NOTE — Telephone Encounter (Signed)
Spoke with April @ [facility]

## 2011-10-04 NOTE — Telephone Encounter (Signed)
Patient is asking that his Percocet be reinstated

## 2011-10-04 NOTE — Telephone Encounter (Signed)
lvm informing pt that Rx is up front for p/u.Jeffrey [name] [place]

## 2011-10-05 ENCOUNTER — Telehealth: Payer: Self-pay | Admitting: Family Medicine

## 2011-10-05 NOTE — Telephone Encounter (Signed)
Jeffrey [name] called to say that the rx for his pain med was already there at the facility, but they wouldn't let him take it because Dr. Sondra Come had discontinued it.  He want her to contact them to auth him taking it again.  Did not need a new rx for this.

## 2011-10-05 NOTE — Telephone Encounter (Signed)
Patient has called requesting that Oxycodone be reinstated.  Had called and left message with [facility] x 2.  Received call back from [facility].  Patient is currently being weaned off of Oxycodone.  Has been taking Oxycontin and she states he has been "doing fine" on Oxycontin---has been up washing dishes, been downtown.  No complaints of breakthrough pain.  Patient has "kicked out glass panel of storm door" at the facility and does not have any injuries.  Police have been notified and are on their way to facility.  She will call our office back after police talk with patient or "take him to jail."  Also states she needs his FL-2 form completed.  Will route note to Dr. Tye Savoy.  Gaylene Brooks, RN

## 2011-10-05 NOTE — Telephone Encounter (Signed)
Hey team, this patient could not wait to get authorization for Oxycontin.  I discontinued the Rx for Oxycontin 40 and wrote a new Rx for Oxycontin 30.  I faxed that Rx to his assisted living facility and a rep supposedly picked up the hard copy the next day.  I will not be filling out a PA for Oxycontin 40 anymore.  Thank you.

## 2011-10-06 NOTE — Telephone Encounter (Signed)
Order for patient to "resume Percocet 7.5-325mg --one tablet every 6 hours as needed for pain.  Okay to give two tablets at bedtime" faxed to Lennox Laity (pharmacist) at [facility].  Fax--[phone].  Also, mailed completed FL-2 form with attached medication list to [facility].  Gaylene Brooks, RN

## 2011-10-06 NOTE — Telephone Encounter (Signed)
Called [facility] this morning x 2 and left message to call our office back.  Also received call from patient checking on status of his Oxycodone Rx.  Patient informed that I will need to speak to person in charge of his meds.  Spoke with Sasha, CNA and left message to have Toma Deiters return my call about patient's meds.  Will await call back from [facility].  Gaylene Brooks, RN  Patient called back with fax number [phone]) and wants [place] to fax med to [facility].  Since med is a narcotic, someone will have to pick Rx up from our office. Will await call back from someone at [facility].  Gaylene Brooks, RN

## 2011-10-06 NOTE — Telephone Encounter (Signed)
Received call from [facility] [employer] [facility].  Informed that Dr. Tye Savoy is reinstating his Oxycodone.  Patient has Oxycodone #27 tabs in his med drawer and our office will not write new Rx until he is out of med.  Our office will need to fax statement that he can take Oxycodone with directions to [phone].  They are also inquiring about FL-2 form.  Form is in PCP's box and can be mailed after completion to [address], [place], [place].  Will route note to PCP.  Gaylene Brooks, RN

## 2011-10-06 NOTE — Telephone Encounter (Signed)
Thanks for talking to [facility].  If Jeffrey [name] would like me to refill his Percocet, then a representative from [facility] needs to come to our office to pick up a Rx AND bring their official "Orders" sheet so that I can write the order to continue Percocet.  If you need me to talk to them, please route me their phone #.  Thanks.

## 2011-10-06 NOTE — Telephone Encounter (Signed)
[facility]---[phone].  Attempted to call several times today, but continue to get an answering machine.  Gaylene Brooks, RN

## 2011-10-06 NOTE — Telephone Encounter (Signed)
Tried calling [phone] but kept getting a busy signal.  Patient needs to go to the Pain Clinic at [facility].  I have tried to refer him to clinics in the area and both clinics have recommended he go to a tertiary care clinic due to his history of addiction and substance abuse.  I can ask [name] if there is another way he can be transported to and from [place].

## 2011-10-06 NOTE — Telephone Encounter (Signed)
Hey Babs, please send me the number to reach [employer].  If I can give a verbal order, I will.  Otherwise, a representative needs to bring an order sheet to our clinic for me to write the order.  Thanks!

## 2011-10-07 ENCOUNTER — Telehealth: Payer: Self-pay | Admitting: *Deleted

## 2011-10-07 NOTE — Telephone Encounter (Signed)
Patient calling today stating "Mrs. Jeffrey [name] is playing God with my medicine.  She won't give me my medicine."  Patient states he is in pain and [facility] will not give him his medication.  I called [facility] and spoke with Misty(pharmacist).  Informed that patient says oxycodone was removed this morning from the home and [facility] care has not been giving him his medication per MD orders.  Misty called [facility] and they state the medication was destroyed.  Misty has requested copy of records from [facility] verifying that oxycodone was destroyed and will fax copy to our office to see if additional action needs to be taken.  Will inform Dr. Tye Savoy and await copy of records.  Gaylene Brooks, RN

## 2011-10-07 NOTE — Telephone Encounter (Signed)
[facility] phone #  [phone].

## 2011-10-07 NOTE — Telephone Encounter (Signed)
Received call from Jeffrey [name] @ [facility].  Percocet was removed from the house and destroyed when it was d/c'd last week.  Patient will need a new Rx written to be able to receive his med.  MD not in office today.  Paged MD---unable to write Rx until tomorrow since she is not in office today.  Jeffrey [name] & [name] informed we will call when rx is ready to pick up.  Routed note to PCP.  Gaylene Brooks, RN

## 2011-10-08 MED ORDER — OXYCODONE-ACETAMINOPHEN 7.5-325 MG PO TABS: 1.0000 | ORAL_TABLET | Freq: Four times a day (QID) | ORAL | Status: DC | PRN

## 2011-10-13 ENCOUNTER — Telehealth: Payer: Self-pay | Admitting: Family Medicine

## 2011-10-13 NOTE — Telephone Encounter (Signed)
First, Jeffrey [name] wants to thank everyone who helped him with his needs last week.  He also has a question about his depression medication dosage and direction.  He has made an appt for 2/20 with Dr. Tye Savoy in case the Pain Clinic appt doesn't happen because he doesn't want to be out of his medicine again.

## 2011-10-27 ENCOUNTER — Ambulatory Visit: Payer: Medicaid Other | Admitting: Family Medicine

## 2011-10-27 ENCOUNTER — Encounter: Payer: Self-pay | Admitting: Family Medicine

## 2011-10-27 ENCOUNTER — Ambulatory Visit (INDEPENDENT_AMBULATORY_CARE_PROVIDER_SITE_OTHER): Payer: Medicaid Other | Admitting: Family Medicine

## 2011-10-27 DIAGNOSIS — M549 Dorsalgia, unspecified: Secondary | ICD-10-CM

## 2011-10-27 DIAGNOSIS — R5381 Other malaise: Secondary | ICD-10-CM

## 2011-10-27 MED ORDER — OXYCODONE HCL 30 MG PO TB12: 30.0000 mg | ORAL_TABLET | Freq: Three times a day (TID) | ORAL | Status: DC

## 2011-10-27 MED ORDER — OXYCODONE-ACETAMINOPHEN 7.5-325 MG PO TABS: 1.0000 | ORAL_TABLET | Freq: Four times a day (QID) | ORAL | Status: DC | PRN

## 2011-10-27 MED ORDER — SILDENAFIL CITRATE 25 MG PO TABS: 25.0000 mg | ORAL_TABLET | Freq: Every day | ORAL | Status: DC | PRN

## 2011-10-28 ENCOUNTER — Telehealth: Payer: Self-pay | Admitting: Family Medicine

## 2011-10-28 NOTE — Telephone Encounter (Signed)
Fwd to PCP

## 2011-10-28 NOTE — Telephone Encounter (Signed)
Patient will need to bring disability forms to our office for me to sign.  He can bring the same paperwork he gave to his facility and I can try to fill it out.  Please ask facility to fax [place] the paperwork.  Thank you!

## 2011-10-28 NOTE — Telephone Encounter (Signed)
Pt states that Disability has cut his pay because the facility he is living at did not turn in the paperwork in time and now he can't get his money.  He needs a letter from Dr Tye Savoy stating what his disabilities are and why he can't work.  Fax to [phone] - he wants it faxed twice.

## 2011-10-29 NOTE — Telephone Encounter (Signed)
Spoke with patient and informed him of below. He believes that the paperwork has been done now, but will keep in touch with [place] about this issue

## 2011-10-31 ENCOUNTER — Encounter: Payer: Self-pay | Admitting: Family Medicine

## 2011-10-31 NOTE — Assessment & Plan Note (Addendum)
Chronic back and hip pain treated with MSContin 30 TID and Percocet PRN. Awaiting referral to [facility]. Refill medications until referral accepted - patient lives in a special home, medications administered via nurse tech. Orders placed for shower chair and hospital bed. RTC in one month.

## 2011-10-31 NOTE — Progress Notes (Signed)
  Subjective:    Patient ID: Jeffrey [name], male    DOB: 10/02/1955, 56 y.o.   MRN: [id number]  HPI  Patient presents to clinic for refill pain medications.  Referral to [facility] is in process.  Patient says pain is stable.  He understands that I will not increased dosage or frequency of pain medications.  He is requesting a shower chair and hospital bed for his assisted living facility to help with pain.  No new complaints today.  Of note, patient has been denied care at two pain clinics due to chronic medical problems and hx of addiction/substance abuse.  Patient refuses PT because they tell him he needs surgery not PT.  He ambulates in a wheelchair.    Review of Systems  ROS negative, except chronic hip and back pain.    Objective:   Physical Exam  Exam deferred today.       Assessment & Plan:

## 2011-11-19 ENCOUNTER — Telehealth: Payer: Self-pay | Admitting: *Deleted

## 2011-11-19 ENCOUNTER — Telehealth: Payer: Self-pay | Admitting: Family Medicine

## 2011-11-19 ENCOUNTER — Ambulatory Visit: Payer: Medicaid Other | Admitting: Family Medicine

## 2011-11-19 NOTE — Telephone Encounter (Signed)
Spoke with Jeffrey [name] at [facility] pain clinic she was having hard time hearing me due to noise interference in background. She then huffed and puffed and said to me not to call until noise was gone in background, then hung up. Will attempt to call later

## 2011-11-19 NOTE — Telephone Encounter (Signed)
LVM with [facility] asking about referral status.  Hopefully they will call back later today or early next week.  Please let me know if you hear from [facility].

## 2011-11-19 NOTE — Telephone Encounter (Signed)
Received call from [facility] sating they received a call from Dr. Tye Savoy and she needs to call [phone] to schedule this appointment. Will route to Jimmy Footman and  MD.

## 2011-11-21 ENCOUNTER — Emergency Department (HOSPITAL_COMMUNITY)
Admission: EM | Admit: 2011-11-21 | Discharge: 2011-11-22 | Disposition: A | Discharge status: Home / Self Care | Payer: Medicaid Other | Attending: Emergency Medicine | Admitting: Emergency Medicine

## 2011-11-21 ENCOUNTER — Encounter (HOSPITAL_COMMUNITY): Payer: Self-pay | Admitting: *Deleted

## 2011-11-21 DIAGNOSIS — Z8619 Personal history of other infectious and parasitic diseases: Secondary | ICD-10-CM | POA: Insufficient documentation

## 2011-11-21 DIAGNOSIS — G8929 Other chronic pain: Secondary | ICD-10-CM

## 2011-11-21 DIAGNOSIS — R110 Nausea: Secondary | ICD-10-CM | POA: Insufficient documentation

## 2011-11-21 DIAGNOSIS — M25559 Pain in unspecified hip: Secondary | ICD-10-CM | POA: Insufficient documentation

## 2011-11-21 DIAGNOSIS — R109 Unspecified abdominal pain: Secondary | ICD-10-CM | POA: Insufficient documentation

## 2011-11-21 HISTORY — DX: Other chronic pain: G89.29

## 2011-11-21 HISTORY — DX: Unspecified viral hepatitis C without hepatic coma: B19.20

## 2011-11-21 NOTE — ED Notes (Signed)
Per PTAR: pt has been out of medications for the past 2 weeks including Blood pressure medications pt BP 180/92. Pt states he ran out of his oxycotten yesterday and is having leg pain, abdominal pain.

## 2011-11-22 ENCOUNTER — Ambulatory Visit (INDEPENDENT_AMBULATORY_CARE_PROVIDER_SITE_OTHER): Payer: Medicaid Other | Admitting: Family Medicine

## 2011-11-22 ENCOUNTER — Encounter: Payer: Self-pay | Admitting: Family Medicine

## 2011-11-22 VITALS — BP 155/96 | HR 70 | Temp 98.0°F

## 2011-11-22 DIAGNOSIS — M169 Osteoarthritis of hip, unspecified: Secondary | ICD-10-CM

## 2011-11-22 DIAGNOSIS — M1710 Unilateral primary osteoarthritis, unspecified knee: Secondary | ICD-10-CM

## 2011-11-22 MED ORDER — OXYCODONE-ACETAMINOPHEN 7.5-325 MG PO TABS: 1.0000 | ORAL_TABLET | Freq: Four times a day (QID) | ORAL | Status: DC | PRN

## 2011-11-22 MED ORDER — OXYCODONE HCL 30 MG PO TB12: 30.0000 mg | ORAL_TABLET | Freq: Three times a day (TID) | ORAL | Status: DC

## 2011-11-22 MED ORDER — METOPROLOL TARTRATE 25 MG PO TABS: 25.0000 mg | ORAL_TABLET | Freq: Two times a day (BID) | ORAL | Status: DC

## 2011-11-22 MED ORDER — LORATADINE 10 MG PO TABS: 10.0000 mg | ORAL_TABLET | Freq: Every day | ORAL | Status: DC

## 2011-11-22 MED ORDER — TADALAFIL 20 MG PO TABS: 10.0000 mg | ORAL_TABLET | ORAL | Status: DC | PRN

## 2011-11-22 MED ORDER — LEVOTHYROXINE SODIUM 137 MCG PO TABS: 137.0000 ug | ORAL_TABLET | Freq: Every day | ORAL | Status: DC

## 2011-11-22 MED ORDER — HYDROMORPHONE HCL PF 2 MG/ML IJ SOLN
2.0000 mg | Freq: Once | INTRAMUSCULAR | Status: AC
  Administered 2011-11-22: 2 mg via INTRAMUSCULAR
  Filled 2011-11-22: qty 1

## 2011-11-22 MED ORDER — TAMSULOSIN HCL 0.4 MG PO CAPS: 0.8000 mg | ORAL_CAPSULE | Freq: Every day | ORAL | Status: DC

## 2011-11-22 MED ORDER — ACETAMINOPHEN 325 MG PO TABS
650.0000 mg | ORAL_TABLET | Freq: Once | ORAL | Status: AC
  Administered 2011-11-22: 650 mg via ORAL

## 2011-11-22 NOTE — Assessment & Plan Note (Signed)
Patient fiven prescription of narcotic pain medication to last him till March 27th. He is supposed to follow up with PCP at that time.

## 2011-11-22 NOTE — ED Provider Notes (Signed)
History     CSN: [id number]  Arrival date & time 11/21/11  2335   First MD Initiated Contact with Patient 11/22/11 0004      Chief Complaint  Patient presents with  . Pain    (Consider location/radiation/quality/duration/timing/severity/associated sxs/prior treatment) Patient is a 56 y.o. male presenting with abdominal pain. The history is provided by the patient.  Abdominal Pain The primary symptoms of the illness include abdominal pain and nausea. The primary symptoms of the illness do not include fever or vomiting. The current episode started 3 to 5 hours ago. The onset of the illness was sudden (He states when he got up from the toilet he felt a pop in the center of his abdomen that has persisted. ).  The pain came on suddenly. The abdominal pain radiates to the periumbilical region.  Symptoms associated with the illness do not include chills. Associated symptoms comments: He also complains of chronic left hip and lower extremity pain and being out of his pain medications for the past 2 days. .    Past Medical History  Diagnosis Date  . DDD (degenerative disc disease)   . Necrotizing fasciitis   . Degenerative disc disease   . Hypertension   . Degenerative disc disease   . Hepatitis C   . Chronic pain     Past Surgical History  Procedure Date  . Hip surgery   . Appendectomy   . Ankle surgery     History reviewed. No pertinent family history.  History  Substance Use Topics  . Smoking status: Former Smoker -- 1.0 packs/day for 36 years    Quit date: 07/27/2011  . Smokeless tobacco: Former Neurosurgeon    Types: Chew  . Alcohol Use: No      Review of Systems  Constitutional: Negative for fever and chills.  HENT: Negative.   Respiratory: Negative.   Cardiovascular: Negative.   Gastrointestinal: Positive for nausea and abdominal pain. Negative for vomiting.  Musculoskeletal:       See HPI.  Skin: Negative.   Neurological: Negative.     Allergies  No known  allergies  Home Medications   Current Outpatient Rx  Name Route Sig Dispense Refill  . AMLODIPINE BESYLATE 5 MG PO TABS Oral Take 1 tablet (5 mg total) by mouth daily. 30 tablet 3  . CITALOPRAM HYDROBROMIDE 20 MG PO TABS Oral Take 1 tablet (20 mg total) by mouth daily. 30 tablet 3  . LEVOTHYROXINE SODIUM 137 MCG PO TABS Oral Take 137 mcg by mouth daily.    Marland Kitchen LORATADINE 10 MG PO TABS Oral Take 10 mg by mouth daily.    Marland Kitchen METOPROLOL TARTRATE 25 MG PO TABS Oral Take 1 tablet (25 mg total) by mouth 2 (two) times daily. 60 tablet 3  . OXYCODONE HCL ER 30 MG PO TB12 Oral Take 1 tablet (30 mg total) by mouth 3 (three) times daily. 90 tablet 0  . OXYCODONE-ACETAMINOPHEN 7.5-325 MG PO TABS Oral Take 1-2 tablets by mouth every 6 (six) hours as needed for pain. Patient may take 2 tab as needed at bedtime. 120 tablet 0    Please fill on 10/30/11.  Marland Kitchen SILDENAFIL CITRATE 25 MG PO TABS Oral Take 1 tablet (25 mg total) by mouth daily as needed for erectile dysfunction. 10 tablet 0  . TAMSULOSIN HCL 0.4 MG PO CAPS Oral Take 0.8 mg by mouth at bedtime.      BP 159/101  Pulse 65  Temp(Src) 98.1 F (36.7 C) (Oral)  Resp 14  Physical Exam  Constitutional: He appears well-developed and well-nourished.  HENT:  Head: Normocephalic.  Neck: Normal range of motion. Neck supple.  Cardiovascular: Normal rate and regular rhythm.   Pulmonary/Chest: Effort normal and breath sounds normal.  Abdominal: Soft. Bowel sounds are normal. He exhibits no mass. There is no rebound and no guarding.       Abdomen completely soft. No masses. Mildly tender to mid-abdomen.   Musculoskeletal: Normal range of motion. He exhibits no edema.       Old cutaneous deformity to left buttock with well healed surgical scar. No acute swelling, redness. FROM, fully weight bearing.  Neurological: He is alert. No cranial nerve deficit.  Skin: Skin is warm and dry. No rash noted.  Psychiatric: He has a normal mood and affect.    ED Course    Procedures (including critical care time)  Labs Reviewed - No data to display No results found.   No diagnosis found.    MDM  Dr. Bebe Shaggy in to co-evaluate. Agree abdomen is benign and does not require further evaluation in the emergency setting. Discussed pain management with patient and he will be dosed here for comfort and is to follow up with primary care tomorrow for any further chronic pain management.        Rodena Medin, PA-C 11/22/11 [phone]

## 2011-11-22 NOTE — ED Notes (Signed)
Pt unable to provide a full history pt just states that his is in pain. Pt states chronic pain in legs.

## 2011-11-22 NOTE — Progress Notes (Signed)
12:45 pm---Patient still here in office from office visit this morning.  Des not have ride home until 3:00 pm and requesting Tylenol for pain.  Ok per Dr. Deirdre Priest to give Tylenol 650 mg po now.  (Lot # [id number]  Exp. 07/02/12)   Gaylene Brooks, RN

## 2011-11-22 NOTE — Patient Instructions (Signed)
Im sorry about your situation.  Keep your appointment with Dr. Tye Savoy on the 27th.   I have refilled all your medications.  I am only filling your oxycodone/oxycontin till the 27th. Dr. Tye Savoy will address your pain medication then.

## 2011-11-22 NOTE — Discharge Instructions (Signed)
Emergency care providers appreciate that many patients coming to [place] are in severe pain and we wish to address their pain in the safest, most responsible manner.  It is important to recognize however, that the proper treatment of chronic pain differs from that of the pain of injuries and acute illnesses.  Our goal is to provide quality, safe, personalized care and we thank you for giving [place] the opportunity to serve you. The use of narcotics and related agents for chronic pain syndromes may lead to additional physical and psychological problems.  Nearly as many people die from prescription narcotics each year as die from car crashes.  Additionally, this risk is increased if such prescriptions are obtained from a variety of sources.  Therefore, only your primary care physician or a pain management specialist is able to safely treat such syndromes with narcotic medications long-term.    Documentation revealing such prescriptions have been sought from multiple sources may prohibit [place] from providing a refill or different narcotic medication.  Your name may be checked first through the [facility] Controlled Substances Reporting System.  This database is a record of controlled substance medication prescriptions that the patient has received.  This has been established by [facility] in an effort to eliminate the dangerous, and often life threatening, practice of obtaining multiple prescriptions from different medical providers.   If you have a chronic pain syndrome (i.e. chronic headaches, recurrent back or neck pain, dental pain, abdominal or pelvis pain without a specific diagnosis, or neuropathic pain such as fibromyalgia) or recurrent visits for the same condition without an acute diagnosis, you may be treated with non-narcotics and other non-addictive medicines.  Allergic reactions or negative side effects that may be reported by a patient to such medications will not typically lead to the use of a narcotic  analgesic or other controlled substance as an alternative.   Patients managing chronic pain with a personal physician should have provisions in place for breakthrough pain.  If you are in crisis, you should call your physician.  If your physician directs you to the emergency department, please have the doctor call and speak to our attending physician concerning your care.   When patients come to the Emergency Department (ED) with acute medical conditions in which the Emergency Department physician feels appropriate to prescribe narcotic or sedating pain medication, the physician will prescribe these in very limited quantities.  The amount of these medications will last only until you can see your primary care physician in his/her office.  Any patient who returns to the ED seeking refills should expect only non-narcotic pain medications.     Prescriptions for narcotic or sedating medications that have been lost, stolen or expired will not be refilled in the Emergency Department.    FOLLOW UP WITH YOUR DOCTOR TOMORROW FOR FURTHER MANAGEMENT OF CHRONIC PAIN.
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
## 2011-11-22 NOTE — ED Provider Notes (Signed)
Pt seen with PA Here for chronic pain - reports chronic hip pain He reports while getting up from toilet he felt "pop" in his abdomen, but no vomiting No new back pain  No CP reported He is able to pass bowel movements On exam, distal pulses x4 abd soft and no focal tenderness Advised we cannot refill pain meds He understands this  The patient appears reasonably screened and/or stabilized for discharge and I doubt any other medical condition or other [facility] requiring further screening, evaluation, or treatment in the ED at this time prior to discharge.   Joya Gaskins, MD 11/22/11 Jacinta Shoe

## 2011-11-22 NOTE — Progress Notes (Signed)
Addended by: Altamese Dilling A on: 11/22/2011 04:24 PM   Modules accepted: Orders

## 2011-11-22 NOTE — ED Provider Notes (Signed)
Medical screening examination/treatment/procedure(s) were conducted as a shared visit with non-physician practitioner(s) and myself.  I personally evaluated the patient during the encounter   Joya Gaskins, MD 11/22/11 0100

## 2011-11-24 NOTE — Telephone Encounter (Signed)
Appointment is set up for Thursday 4/11 @ 2:20pm patient is to arrive @ 1:50pm. [facility]. Dr. Sharman Crate. [address] [place] [address]. If needing to reschedule call [phone]. This visit is a consultation and no narcotics will be prescribed at this visit. The doctor will the decide after the visit if and when any narcotics will be prescribed. i will give this appointment and message to patient when he comes in for office visit with Dr Tommi Rumps Lawson Radar due to this patient moving numerous times and going through many phone numbers.Erma Pinto

## 2011-12-01 ENCOUNTER — Ambulatory Visit (INDEPENDENT_AMBULATORY_CARE_PROVIDER_SITE_OTHER): Payer: Medicaid Other | Admitting: Family Medicine

## 2011-12-01 ENCOUNTER — Encounter: Payer: Self-pay | Admitting: Family Medicine

## 2011-12-01 VITALS — BP 142/87 | HR 70 | Temp 98.3°F

## 2011-12-01 DIAGNOSIS — M169 Osteoarthritis of hip, unspecified: Secondary | ICD-10-CM

## 2011-12-01 DIAGNOSIS — M1710 Unilateral primary osteoarthritis, unspecified knee: Secondary | ICD-10-CM

## 2011-12-01 MED ORDER — OXYCODONE-ACETAMINOPHEN 7.5-325 MG PO TABS: 1.0000 | ORAL_TABLET | Freq: Four times a day (QID) | ORAL | Status: DC | PRN

## 2011-12-01 MED ORDER — OXYCODONE HCL 30 MG PO TB12: 30.0000 mg | ORAL_TABLET | Freq: Three times a day (TID) | ORAL | Status: DC

## 2011-12-01 NOTE — Progress Notes (Signed)
  Subjective:    Patient ID: Jeffrey [name], male    DOB: 1955-12-29, 56 y.o.   MRN: [id number]  HPI  Patient presents to clinic for refill pain medications. Patient currently takes OxyContin 30 one tablet 3 times a day and Percocet 1-2 tablets every 6 hours as needed for breakthrough pain. Patient has a left [place] home due to his treatment. He currently lives in a motel/apartment complex off of [address]. He now administers his own medications and is much happier now that he is out of at home. Patient not asking for an increase dose or frequency of pain medications. He understands that if pain clinic at [facility] begins to manage his pain, I will no longer be prescribing narcotics. Because most pain clinics in the area have denied him, patient does not think [facility] will accept him either.  Pain is located left hip and left knee. He currently is in a scooter, but complains of pain with standing/exertion.  Review of Systems  Denies fever, chills, night sweats, nausea or vomiting, difficulty breathing.    Objective:   Physical Exam  Gen.: No acute distress Musculoskeletal: Pain on palpation of the left hip and left knee. Limited range of motion due to wheelchair. No signs of infection, rash or erythema on skin.      Assessment & Plan:

## 2011-12-01 NOTE — Assessment & Plan Note (Signed)
Will refill OxyContin 30 one tablet 3 times daily and Percocet one to 2 tablets every 6 hours as needed for pain. Appointment at [facility] pain and addiction medicine clinic scheduled for December 16, 2010. Patient does not have any means of transportation. Therefore, will contact [name] for assistance with this case. Return to clinic in 4 weeks- at that time, will need to do blood test specific for oxycodone.

## 2011-12-01 NOTE — Patient Instructions (Signed)
Take medications as directed.  Hold if you feel drowsy or if you experience difficulty breathing. Try to call [facility] to take you to [facility] on 12/16/11. I will also contact our social worker to help with arrangements. Follow up with me in one month.

## 2011-12-02 ENCOUNTER — Telehealth: Payer: Self-pay | Admitting: Clinical

## 2011-12-02 NOTE — Telephone Encounter (Signed)
Clinical Social Worker (CSW) received referral to assist pt in obtaining transportation to his appointment at [facility] April 11. CSW contacted Medicaid Transportation to see whether pt is setup with them. CSW informed that pt is in their system however since he lives in [place] will need to contact [place] DSS to get transportation set-up in that city/county. After that is setup he and MD will have to complete an out of county form requesting for transportation to [place]. CSW attempted to contact pt however was unsuccessful. One of pt contact numbers are disconnected and the other is of pt friend Kendell Bane who asked that CSW remove his contact information from pt demographics. Dorene Sorrow provided CSW with an updated number for pt. [phone], CSW left a vm and will await a call back.   Theresia Bough, MSW, Theresia Majors [phone]

## 2011-12-02 NOTE — Telephone Encounter (Signed)
Message copied by Theresia Bough on Thu Dec 02, 2011  9:17 AM ------      Message from: Deno Etienne      Created: Wed Dec 01, 2011  6:29 PM       [name],            Dr.de la Sondra Come is asking if you can assist this pt with transportation to [facility] for an appointment scheduled for December 16, 2010.      ~t      ----- Message -----         From: Barnabas Lister, MD         Sent: 12/01/2011   5:22 PM           To: [facility] [employer] do I route this to [place], SW?  Devron needs help with transportation to get him to [facility] appointment.

## 2011-12-03 NOTE — Telephone Encounter (Signed)
Thank you [name].  This patient definitely needs to be seen at [facility] so let me know what forms I need to sign, etc.  Please keep me posted.

## 2011-12-07 ENCOUNTER — Telehealth: Payer: Self-pay | Admitting: Clinical

## 2011-12-07 NOTE — Telephone Encounter (Signed)
Message copied by Theresia Bough on Tue Dec 07, 2011 11:03 AM ------      Message from: Deno Etienne      Created: Wed Dec 01, 2011  6:29 PM       [name],            Dr.de la Sondra Come is asking if you can assist this pt with transportation to [facility] for an appointment scheduled for December 16, 2010.      ~t      ----- Message -----         From: Barnabas Lister, MD         Sent: 12/01/2011   5:22 PM           To: [facility] do I route this to [place], SW?  Jeffrey [name] needs help with transportation to get him to [facility] appointment.

## 2011-12-07 NOTE — Telephone Encounter (Signed)
Clinical Child psychotherapist (CSW) still unable to reach pt to notify him that he needs to go to [place] DSS to inform them that he is now living there and needs transportation services out of the county. CSW has left 2 messages for pt as pt's appointment is April 11 at [facility].  [place], MSW, [place] [phone]

## 2011-12-09 ENCOUNTER — Telehealth: Payer: Self-pay | Admitting: Clinical

## 2011-12-09 NOTE — Telephone Encounter (Signed)
Clinical Social Worker (CSW) received a call from pt inquiring about what pt needs to do to make his appointe April 11. CSW informed pt that she called him last week considering setting up transportation could take longer than a week. Pt informed CSW that he is a resident of [facility] however lives in [place]. CSW informed pt that he needs to call Medicaid transportation services [phone] asap to inform them of transport and to receive necessary documentation for the out of county transport. Pt seemed frustrated as he does not think this can happen before next week. CSW reminded pt that CSW attempted to call him 2x to assist with this process. CSW appreciated CSW assistance and stated he would contact Medicaid transportation now.   Theresia Bough, MSW, Theresia Majors [phone]

## 2011-12-09 NOTE — Telephone Encounter (Addendum)
Hi Jeffrey [name] - his [facility] appointment needs to be rescheduled because of transportation issues.  You can ask [name] - it's a long, frustrating process.  Can you call [facility] and reschedule the appointment for late April or early May between 9:20 AM and 12 Noon.  This is the only time that the Medicaid Bus can get him to [facility].  Please let me know if you have any issues.  I'll be back Wednesday.  Thanks.

## 2011-12-10 NOTE — Telephone Encounter (Signed)
If needing to reschedule call [phone], patient is to call [facility] and reschedule appointment himself. He received all the paperwork in the mail, stated this last visit in front of me and Dr Tye Savoy. He can call the numbers given to him and set up an appointment that way. He has many issues and phone number/address changes that he needs to do this himself.

## 2011-12-13 ENCOUNTER — Telehealth: Payer: Self-pay | Admitting: Clinical

## 2011-12-13 NOTE — Telephone Encounter (Signed)
CSW contacted pt to see whether he received the out of county form CSW mailed him last week. Pt stated he will check the mail today and send it back to CSW. CSW informed pt that his appointment needed to be changed for an earlier time-later week. Pt agreeable and appreciative of CSW assistance. CSW contacted [facility] and rescheduled his appointment for Jan 05, 2012 at 11:20am. CSW will contact the pt later this week to inform him of appt as pt does not have constant access to a phone.  Theresia Bough, MSW, Theresia Majors [phone]

## 2011-12-13 NOTE — Telephone Encounter (Signed)
Late Entry for 12/09/11  CSW contacted Medicaid transportation- though pt lives right in between [place] and [place], [facility] is willing to transport pt to his appt at [facility] once he and MD complete the necessary out of county form. CSW has mailed the form to pt with a return envelope that does not require a stamp. Once CSW receives envelope she will provide it to MD and then have it submitted to [employer]. CSW informed by Medicaid transportation that they only go to [facility] 1x a day (drop off at 9:30am and pick up at 12:45) therefore his appt will need to be scheduled in between those times. Pt aware.   Theresia Bough, MSW, Theresia Majors [phone]

## 2011-12-13 NOTE — Telephone Encounter (Signed)
  CSW contacted pt to see whether he received the out of county form CSW mailed him last week. Pt stated he will check the mail today and send it back to CSW. CSW informed pt that his appointment needed to be changed for an earlier time-later week. Pt agreeable and appreciative of CSW assistance. CSW contacted [facility] and rescheduled his appointment for Jan 05, 2012 at 11:20am. CSW will contact the pt later this week to inform him of appt as pt does not have constant access to a phone.  Theresia Bough, MSW, Theresia Majors  [phone]

## 2011-12-14 ENCOUNTER — Emergency Department (HOSPITAL_COMMUNITY)
Admission: EM | Admit: 2011-12-14 | Discharge: 2011-12-14 | Disposition: A | Discharge status: Home / Self Care | Payer: 59 | Attending: Emergency Medicine | Admitting: Emergency Medicine

## 2011-12-14 ENCOUNTER — Encounter (HOSPITAL_COMMUNITY): Payer: Self-pay

## 2011-12-14 DIAGNOSIS — R69 Illness, unspecified: Secondary | ICD-10-CM | POA: Insufficient documentation

## 2011-12-14 DIAGNOSIS — G8929 Other chronic pain: Secondary | ICD-10-CM | POA: Insufficient documentation

## 2011-12-14 DIAGNOSIS — IMO0002 Reserved for concepts with insufficient information to code with codable children: Secondary | ICD-10-CM | POA: Insufficient documentation

## 2011-12-14 DIAGNOSIS — I10 Essential (primary) hypertension: Secondary | ICD-10-CM | POA: Insufficient documentation

## 2011-12-14 DIAGNOSIS — Z87891 Personal history of nicotine dependence: Secondary | ICD-10-CM | POA: Insufficient documentation

## 2011-12-14 DIAGNOSIS — B1920 Unspecified viral hepatitis C without hepatic coma: Secondary | ICD-10-CM | POA: Insufficient documentation

## 2011-12-14 DIAGNOSIS — R109 Unspecified abdominal pain: Secondary | ICD-10-CM | POA: Insufficient documentation

## 2011-12-14 DIAGNOSIS — F1010 Alcohol abuse, uncomplicated: Secondary | ICD-10-CM

## 2011-12-14 MED ORDER — ONDANSETRON HCL 4 MG/2ML IJ SOLN
INTRAMUSCULAR | Status: AC
  Administered 2011-12-14: 4 mg
  Filled 2011-12-14: qty 2

## 2011-12-14 NOTE — ED Provider Notes (Signed)
Medical screening examination/treatment/procedure(s) were performed by non-physician practitioner and as supervising physician I was immediately available for consultation/collaboration.  Addy Mcmannis M Eddie Koc, MD 12/14/11 2134 
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
## 2011-12-14 NOTE — ED Provider Notes (Signed)
History     CSN: [id number]  Arrival date & time 12/14/11  0243   First MD Initiated Contact with Patient 12/14/11 [phone]      Chief Complaint  Patient presents with  . Alcohol Intoxication  . Abdominal Pain    (Consider location/radiation/quality/duration/timing/severity/associated sxs/prior treatment) Patient is a 56 y.o. male presenting with alcohol problem. The history is provided by the patient.  Alcohol Problem This is a chronic problem. Associated symptoms include abdominal pain. Pertinent negatives include no vomiting.  Pt states he has chronic pain, in his left hip, his knee, his abdomen. States "I need pain  Medications, I ran out." States that he also wants detox from alcohol and opiods. Pt states his last drink was yesterday. States has hx of being alcohol free for 3 years. Denies nausea, vomiting, fever. Pt is a poor historian, appears intoxicated, keeps sreaming out "I need pain medications, and I need detox now!"    Past Medical History  Diagnosis Date  . DDD (degenerative disc disease)   . Necrotizing fasciitis   . Degenerative disc disease   . Hypertension   . Degenerative disc disease   . Hepatitis C   . Chronic pain     Past Surgical History  Procedure Date  . Hip surgery   . Appendectomy   . Ankle surgery     History reviewed. No pertinent family history.  History  Substance Use Topics  . Smoking status: Former Smoker -- 1.0 packs/day for 36 years    Quit date: 07/27/2011  . Smokeless tobacco: Former Neurosurgeon    Types: Chew  . Alcohol Use: Yes      Review of Systems  Constitutional: Negative.   HENT: Negative.   Eyes: Negative.   Respiratory: Negative.   Gastrointestinal: Positive for abdominal pain. Negative for vomiting and diarrhea.  Genitourinary: Negative.   Musculoskeletal: Negative.   Skin: Negative.   Psychiatric/Behavioral: Positive for agitation.    Allergies  No known allergies  Home Medications   Current Outpatient Rx    Name Route Sig Dispense Refill  . AMLODIPINE BESYLATE 5 MG PO TABS Oral Take 1 tablet (5 mg total) by mouth daily. 30 tablet 3  . LEVOTHYROXINE SODIUM 137 MCG PO TABS Oral Take 1 tablet (137 mcg total) by mouth daily. 30 tablet 11  . LORATADINE 10 MG PO TABS Oral Take 1 tablet (10 mg total) by mouth daily. 30 tablet 11  . METOPROLOL TARTRATE 25 MG PO TABS Oral Take 1 tablet (25 mg total) by mouth 2 (two) times daily. 60 tablet 11  . OXYCODONE HCL ER 30 MG PO TB12 Oral Take 1 tablet (30 mg total) by mouth 3 (three) times daily. 90 tablet 0  . OXYCODONE-ACETAMINOPHEN 7.5-325 MG PO TABS Oral Take 1-2 tablets by mouth every 6 (six) hours as needed for pain. Patient may take 2 tab as needed at bedtime. 150 tablet 0  . TAMSULOSIN HCL 0.4 MG PO CAPS Oral Take 2 capsules (0.8 mg total) by mouth at bedtime. 30 capsule 11  . TADALAFIL 20 MG PO TABS Oral Take 0.5-1 tablets (10-20 mg total) by mouth every other day as needed for erectile dysfunction. 5 tablet 11    BP 140/86  Pulse 74  Temp(Src) 98.3 F (36.8 C) (Oral)  Resp 16  SpO2 100%  Physical Exam  Nursing note and vitals reviewed. Constitutional: He is oriented to person, place, and time. He appears well-developed and well-nourished.  HENT:  Head: Normocephalic.  Eyes: Conjunctivae  are normal.  Neck: Neck supple.  Cardiovascular: Normal rate, regular rhythm and normal heart sounds.   Pulmonary/Chest: Effort normal and breath sounds normal. No respiratory distress.  Abdominal: Soft. Bowel sounds are normal. There is no rebound and no guarding.       Diffuse tenderness, no guarding  Musculoskeletal: Normal range of motion.       Large old surgical scar to the left hip  Neurological: He is alert and oriented to person, place, and time.  Skin: Skin is warm and dry.  Psychiatric:       Pt appears intoxicated, screaming, aggitated    ED Course  Procedures (including critical care time)  Pt appears intoxicated, asking for detox.  Explained to him he can do  Outpatient detox. Also requesting pain medications, pt is a chronic pain pt, explained that he needs to get pain medications from his doctor. Pt did not complain of abdominal pain to me, abdomen benign on exam, no guarding. Pt complaining of chronic leg pain. I believe that pt does not have an emergent condition, he can follow up with his doctor for his pain management and I will give him outpatient detox information.    1. Chronic pain   2. Alcohol abuse       MDM          Lottie Mussel, PA 12/14/11 1604

## 2011-12-14 NOTE — ED Notes (Signed)
Pt requesting detox from etoh, opiates, last drink last night

## 2011-12-14 NOTE — Discharge Instructions (Signed)
Your pain medications must be prescribed to you by your doctor only. Make sure you follow up for refill. Follow up outpatient for alcohol detox. Do not drink.   RESOURCE GUIDE  Dental Problems  Patients with Medicaid: [facility] [phone]                                           [phone] Phone:  [phone]                                                  Phone:  [phone]  If unable to pay or uninsured, contact:  [facility] or [facility]. to become qualified for the adult dental clinic.  Chronic Pain Problems Contact [facility]  [phone] Patients need to be referred by their primary care doctor.  Insufficient Money for Medicine Contact United Way:  call "211" or [facility] [phone].  No Primary Care Doctor Call [facility]  [phone] Other agencies that provide inexpensive medical care    [facility]  [phone]    [facility]  [phone]    [facility]  [phone]    [facility]  [phone]    [facility]  [phone]    [facility]  [phone]  Psychological Services [facility]  [phone] [facility]  [phone] [facility]   [phone] (emergency services [phone])  Substance Abuse Resources Alcohol and Drug Services  [phone] [facility] ([phone] [facility] [phone] Floydene Flock ([phone] Residential & Outpatient Substance Abuse Program  [phone]  Abuse/Neglect [facility] Child Abuse Hotline (331) 172-2792 [facility] Child Abuse Hotline 440-394-2214 (After Hours)  Emergency Shelter [facility] [phone]  Maternity Homes [facility] [place] [facility] [phone]) [phone] [facility] [phone]  MRSA Hotline #:   (778)414-4847    [facility] Resources  Free  [facility] [place]     United Way                          [facility] Dept. [address] [employer]                       [address]      [address]  [place]                                                Cristobal Goldmann Phone:  [phone]  Phone:  [phone]                 Phone:  [phone]  [facility] Mental Health Phone:  ([phone]  [facility] Child Abuse Hotline (702) 774-8559 (305)[phone] (After Hours)    Alcohol Problems Most adults who drink alcohol drink in moderation (not a lot) are at low risk for developing problems related to their drinking. However, all drinkers, including low-risk drinkers, should know about the health risks connected with drinking alcohol. RECOMMENDATIONS FOR LOW-RISK DRINKING  Drink in moderation. Moderate drinking is defined as follows:   Men - no more than 2 drinks per day.   Nonpregnant women - no more than 1 drink per day.   Over age 21 - no more than 1 drink per day.  A standard drink is 12 grams of pure alcohol, which is equal to a 12 ounce bottle of beer or wine cooler, a 5 ounce glass of wine, or 1.5 ounces of distilled spirits (such as whiskey, brandy, vodka, or rum).  ABSTAIN FROM (DO NOT DRINK) ALCOHOL:  When pregnant or considering pregnancy.   When taking a medication that interacts with alcohol.   If you are alcohol dependent.   A medical condition that prohibits drinking alcohol (such as ulcer, liver disease, or heart disease).  DISCUSS WITH YOUR CAREGIVER:  If you are at risk for coronary heart disease, discuss the potential benefits and risks of alcohol use: Light to moderate drinking is associated with lower rates of coronary heart disease in certain populations (for example, men over age 42 and postmenopausal women). Infrequent or nondrinkers are advised not to begin light to moderate drinking to reduce the risk of  coronary heart disease so as to avoid creating an alcohol-related problem. Similar protective effects can likely be gained through proper diet and exercise.   Women and the elderly have smaller amounts of body water than men. As a result women and the elderly achieve a higher blood alcohol concentration after drinking the same amount of alcohol.   Exposing a fetus to alcohol can cause a broad range of birth defects referred to as Fetal Alcohol Syndrome (FAS) or Alcohol-Related Birth Defects (ARBD). Although FAS/ARBD is connected with excessive alcohol consumption during pregnancy, studies also have reported neurobehavioral problems in infants born to mothers reporting drinking an average of 1 drink per day during pregnancy.   Heavier drinking (the consumption of more than 4 drinks per occasion by men and more than 3 drinks per occasion by women) impairs learning (cognitive) and psychomotor functions and increases the risk of alcohol-related problems, including accidents and injuries.  CAGE QUESTIONS:   Have you ever felt that you should Cut down on your drinking?   Have people Annoyed you by criticizing your drinking?   Have you ever felt bad or Guilty about your drinking?   Have you ever had a drink first thing in the morning to steady your nerves or get rid of a hangover (Eye opener)?  If you answered positively to any of these questions: You may be at risk for alcohol-related problems if alcohol consumption is:   Men: Greater than 14 drinks per week or more than 4 drinks per occasion.   Women: Greater than 7 drinks per week or more than 3 drinks per occasion.  Do you or your family have a medical history of alcohol-related problems, such as:  Blackouts.   Sexual dysfunction.   Depression.   Trauma.   Liver dysfunction.   Sleep  disorders.   Hypertension.   Chronic abdominal pain.   Has your drinking ever caused you problems, such as problems with your family, problems with  your work (or school) performance, or accidents/injuries?   Do you have a compulsion to drink or a preoccupation with drinking?   Do you have poor control or are you unable to stop drinking once you have started?   Do you have to drink to avoid withdrawal symptoms?   Do you have problems with withdrawal such as tremors, nausea, sweats, or mood disturbances?   Does it take more alcohol than in the past to get you high?   Do you feel a strong urge to drink?   Do you change your plans so that you can have a drink?   Do you ever drink in the morning to relieve the shakes or a hangover?  If you have answered a number of the previous questions positively, it may be time for you to talk to your caregivers, family, and friends and see if they think you have a problem. Alcoholism is a chemical dependency that keeps getting worse and will eventually destroy your health and relationships. Many alcoholics end up dead, impoverished, or in prison. This is often the end result of all chemical dependency.  Do not be discouraged if you are not ready to take action immediately.   Decisions to change behavior often involve up and down desires to change and feeling like you cannot decide.   Try to think more seriously about your drinking behavior.   Think of the reasons to quit.  WHERE TO GO FOR ADDITIONAL INFORMATION   The National Institute on Alcohol Abuse and Alcoholism (NIAAA)www.niaaa.nih.gov   [employer] on Alcoholism and Drug Dependence (NCADD)www.ncadd.org   American Society of Addiction Medicine (ASAM)www.https://anderson-johnson.com/  Document Released: 08/23/2005 Document Revised: 08/12/2011 Document Reviewed: 04/10/2008 [facility] Patient Information 2012 [place], [place].

## 2011-12-23 ENCOUNTER — Telehealth: Payer: Self-pay | Admitting: Clinical

## 2011-12-23 NOTE — Telephone Encounter (Signed)
Clinical Child psychotherapist (CSW) received a call from pt inquiring whether CSW received the out of county form that pt mailed back to CSW. CSW informed pt that she indeed received it, MD has signed and CSW has faxed it to [facility] transportation. CSW informed pt he now needs to call to setup his transportation for Jan 05, 2012 at 11:20am at [facility]. CSW also informed pt that per his request CSW faxed a copy of his Medicaid card to [facility] so that pt can receive a free phone. Pt very appreciative of CSW and stated he would contact Medicaid transportation now for his appt and follow up with safe link. No further CSW requests or needs at this time.  Theresia Bough, MSW, Theresia Majors [phone]

## 2011-12-29 ENCOUNTER — Telehealth: Payer: Self-pay | Admitting: Clinical

## 2011-12-29 NOTE — Telephone Encounter (Signed)
Clinical Social Worker (CSW) informed by [employer] [phone] that pt transportation to [facility] has been setup and pt need be ready at his home at 5:10am. CSW left a message for pt to inform him of this information.  Theresia Bough, MSW, Theresia Majors [phone]

## 2011-12-30 ENCOUNTER — Encounter: Payer: Self-pay | Admitting: Family Medicine

## 2011-12-30 ENCOUNTER — Ambulatory Visit (INDEPENDENT_AMBULATORY_CARE_PROVIDER_SITE_OTHER): Payer: Medicaid Other | Admitting: Family Medicine

## 2011-12-30 VITALS — BP 178/99 | HR 74 | Temp 97.9°F | Wt 225.4 lb

## 2011-12-30 DIAGNOSIS — M169 Osteoarthritis of hip, unspecified: Secondary | ICD-10-CM

## 2011-12-30 DIAGNOSIS — F1190 Opioid use, unspecified, uncomplicated: Secondary | ICD-10-CM

## 2011-12-30 DIAGNOSIS — F1110 Opioid abuse, uncomplicated: Secondary | ICD-10-CM

## 2011-12-30 DIAGNOSIS — M1710 Unilateral primary osteoarthritis, unspecified knee: Secondary | ICD-10-CM

## 2011-12-30 MED ORDER — OXYCODONE HCL 30 MG PO TB12: 30.0000 mg | ORAL_TABLET | Freq: Three times a day (TID) | ORAL | Status: DC

## 2011-12-30 MED ORDER — OXYCODONE-ACETAMINOPHEN 7.5-325 MG PO TABS: 1.0000 | ORAL_TABLET | Freq: Four times a day (QID) | ORAL | Status: DC | PRN

## 2011-12-30 NOTE — Assessment & Plan Note (Signed)
See notes under Opiate Use.

## 2011-12-30 NOTE — Patient Instructions (Signed)
Please do not drink alcohol while taking narcotics.  Remember to keep your appointment with [facility] on May 1 and be ready to be picked up at 5:10 AM.  If you develop difficulty breathing, shortness of breath, lightheadedness/dizziness, or you feel like you are going to pass out, please call 911.  Schedule follow up appointment with PCP as needed.  Please ask [facility] to [facility] records to [facility] [phone].

## 2011-12-30 NOTE — Progress Notes (Signed)
Patient ID: Jeffrey [name], male   DOB: 20-Jun-1956, 56 y.o.   MRN: [id number]  Subjective:    Patient ID: Jeffrey [name], male    DOB: 08/01/1956, 56 y.o.   MRN: [id number]  HPI  Patient presents to clinic for refill pain medications. Patient currently takes OxyContin 30 one tablet 3 times a day and Percocet 1-2 tablets every 6 hours as needed for breakthrough pain. He currently lives in a motel/apartment complex [address]. He now administers his own medications and is much happier now that he is out of at home. Patient not asking for an increase dose or frequency of pain medications.  He was seen in ED recently and EDP noted patient was yelling "I need to detox from alcohol and narcotics."  Patient has an appointment with [facility] on May 1.  He understands that if pain clinic at [facility] begins to manage his pain, I will no longer be prescribing narcotics.   Pain is located left hip and left knee. He currently is in a scooter, but complains of pain with standing/exertion.  Review of Systems  Denies fever, chills, night sweats, nausea or vomiting, difficulty breathing.    Objective:   Physical Exam  Gen.: No acute distress Musculoskeletal: Pain on palpation of the left hip and left knee. Limited range of motion due to wheelchair. No signs of infection, rash or erythema on skin.     Assessment & Plan:

## 2011-12-30 NOTE — Assessment & Plan Note (Addendum)
Appointment with [facility] scheduled for May 1.  Will follow up recommendations.  Hopefully, patient will be evaluated for psychiatric/addiction and be treated inpatient.  If [facility] begins to prescribe narcotics or methadone for weaning, I will no longer prescribe patient's pain medications.  Advised patient to stop drinking alcohol in addition to narcotics.  Currently, patient is not asking for high doses or more frequent doses.  Will refill pain medication for 30 more days and then review medical records from [place].

## 2012-01-04 ENCOUNTER — Telehealth: Payer: Self-pay | Admitting: Clinical

## 2012-01-04 NOTE — Telephone Encounter (Signed)
Clinical Social Worker (CSW) received a call from [employer] who informed CSW (after trying to confirm pt appt.) that [facility] [phone] cancelled pt's appointment for tomorrow. CSW contacted [facility] inquiring as to why they cancelled pts appointment and did not notify the pt. CSW could not obtain any information from the secretary however left a vm for the nurse. CSW also informed pt of appt cancellation. CSW will follow up with [facility].  Theresia Bough, MSW, Theresia Majors [phone]

## 2012-01-05 ENCOUNTER — Telehealth: Payer: Self-pay | Admitting: Clinical

## 2012-01-05 NOTE — Telephone Encounter (Signed)
Clinical Social Worker (CSW) received a call from the Financial planner at the [facility] [phone] and was informed that pt appt was cancelled because the pt is in out of county pt. [facility] apparently receives in county pts first and then will review out of county pts on a need basis. CSW informed that pt needs can be met in his home county and therefore will not be accepted at [facility] Pt aware.  Theresia Bough, MSW, Theresia Majors [phone]

## 2012-01-06 NOTE — Telephone Encounter (Signed)
Well, this is unfortunate.  My options now are 1) call [facility] myself and explain the reason for out of county referral or 2) attempt to refer him to a local Pain Clinic again and ask them to help me refer him to [facility].  Let me know your thoughts.  I have never had this much difficulty referring a patient to Pain Medicine.

## 2012-01-10 ENCOUNTER — Telehealth: Payer: Self-pay | Admitting: Family Medicine

## 2012-01-10 MED ORDER — DEPEND UNDERGARMENTS MISC: Status: DC

## 2012-01-10 NOTE — Telephone Encounter (Signed)
Depends Rx sent to his pharmacy today.

## 2012-01-10 NOTE — Telephone Encounter (Signed)
FWD to PCP

## 2012-01-10 NOTE — Telephone Encounter (Signed)
States that Medicaid will pay for his depends and 23x36 pads with a script.  He cannot afford this OTC [name] - [employer]

## 2012-01-10 NOTE — Telephone Encounter (Signed)
LMOM informing of below.  

## 2012-01-24 ENCOUNTER — Telehealth: Payer: Self-pay | Admitting: Clinical

## 2012-01-24 NOTE — Telephone Encounter (Signed)
Clinical Child psychotherapist (CSW) received a call from pt greeting CSW with "Hi Baby!" CSW informed pt not to call CSW "baby" as that is very innapropriate & disrespectful. CSW reminded pt of CSW professional role. Pt apologized and stated he would not say that again. CSW informed pt that pt is to call the clinic whenever he needs to speak to CSW as this is CSW hospital phone. Pt agreed to call the clinic in the future.  Pt informed CSW that he is interested in placement at an ALF or a group home as he would like more consistent care. Pt informed CSW that he has been "kicked out" from several facilities ([facility] however wanted placement in [place]. CSW informed pt that it could be difficult for pt to get any bed offers in [place] as pt has a history of being noncompliant in facilities. CSW reminded pt that if he goes to a facility he will have to be compliant and will not be able to smoke or come and go as he pleases. CSW encouraged pt to think about whether he really is interested in placement before CSW spends time searching for placement. Pt agreed and stated he would spend time thinking about it today and discuss it with his PCP at his appointment tomorrow. CSW will facilitate with finding placement once pt confirms with PCP.  Theresia Bough, MSW, Theresia Majors [phone]

## 2012-01-25 ENCOUNTER — Ambulatory Visit (INDEPENDENT_AMBULATORY_CARE_PROVIDER_SITE_OTHER): Payer: Medicaid Other | Admitting: Family Medicine

## 2012-01-25 ENCOUNTER — Encounter: Payer: Self-pay | Admitting: Family Medicine

## 2012-01-25 VITALS — BP 160/96 | Temp 98.8°F | Ht 75.0 in | Wt 225.0 lb

## 2012-01-25 DIAGNOSIS — M169 Osteoarthritis of hip, unspecified: Secondary | ICD-10-CM

## 2012-01-25 DIAGNOSIS — M1710 Unilateral primary osteoarthritis, unspecified knee: Secondary | ICD-10-CM

## 2012-01-25 MED ORDER — OXYCODONE-ACETAMINOPHEN 7.5-325 MG PO TABS: 1.0000 | ORAL_TABLET | Freq: Four times a day (QID) | ORAL | Status: DC | PRN

## 2012-01-25 MED ORDER — AMLODIPINE BESYLATE 5 MG PO TABS: 5.0000 mg | ORAL_TABLET | Freq: Every day | ORAL | Status: DC

## 2012-01-25 MED ORDER — OXYCODONE HCL 30 MG PO TB12: 30.0000 mg | ORAL_TABLET | Freq: Three times a day (TID) | ORAL | Status: DC

## 2012-01-25 MED ORDER — METOPROLOL TARTRATE 25 MG PO TABS: 25.0000 mg | ORAL_TABLET | Freq: Two times a day (BID) | ORAL | Status: DC

## 2012-01-25 MED ORDER — LEVOTHYROXINE SODIUM 137 MCG PO TABS: 137.0000 ug | ORAL_TABLET | Freq: Every day | ORAL | Status: DC

## 2012-01-25 MED ORDER — TAMSULOSIN HCL 0.4 MG PO CAPS: 0.8000 mg | ORAL_CAPSULE | Freq: Every day | ORAL | Status: DC

## 2012-01-25 NOTE — Patient Instructions (Signed)
Please return to clinic in ONE month. SW and I will work on referral to Pain Clinic. If you ever feel unsafe or have thoughts of harming others/yourself, call the Crisis Line or go to [employer].

## 2012-01-25 NOTE — Progress Notes (Signed)
  Subjective:    Patient ID: Jeffrey [name], male    DOB: [birth year], 56 y.o.   MRN: [id number]  HPI  Patient presents to clinic for refill pain medications. Patient currently takes OxyContin 30 one tablet 3 times a day and Percocet 1-2 tablets every 6 hours as needed for breakthrough pain. He currently lives in a motel/apartment complex [address]. He now administers his own medications.  Patient not asking for an increase dose or frequency of pain medications.  He is considering checking into an assisted living facility if he cannot take care of himself.  Patient's appointment at [facility] was cancelled per [facility] because patient was not local. Pain is located left hip and left knee, and now right knee is starting to bother him.  He currently is in a scooter, but complains of pain with standing/exertion.   Review of Systems  Denies any difficulty breathing, chest pain, shortness of breath, or fatigue.    Objective:   Physical Exam  Constitutional: No distress.  Cardiovascular: Regular rhythm and normal heart sounds.   No murmur heard.      Distant HS  Pulmonary/Chest: Effort normal and breath sounds normal. No respiratory distress. He has no wheezes. He has no rales.  Musculoskeletal:       Sitting comfortably in scooter.          Assessment & Plan:

## 2012-01-25 NOTE — Assessment & Plan Note (Signed)
Refill Oxycodone and Percocet.  Will work with [name] to refer patient to another tertiary care center, possibly [facility].  Patient needs to be weaned off narcotics safely and would benefit from a tertiary care center.  Family Services handout given with emphasis on Crisis Line.  Follow up in 4 weeks.

## 2012-02-07 ENCOUNTER — Emergency Department (HOSPITAL_COMMUNITY)
Admission: EM | Admit: 2012-02-07 | Discharge: 2012-02-08 | Disposition: A | Discharge status: Home / Self Care | Payer: Medicaid Other | Attending: Emergency Medicine | Admitting: Emergency Medicine

## 2012-02-07 ENCOUNTER — Encounter (HOSPITAL_COMMUNITY): Payer: Self-pay | Admitting: Emergency Medicine

## 2012-02-07 DIAGNOSIS — Z79899 Other long term (current) drug therapy: Secondary | ICD-10-CM | POA: Insufficient documentation

## 2012-02-07 DIAGNOSIS — I10 Essential (primary) hypertension: Secondary | ICD-10-CM | POA: Insufficient documentation

## 2012-02-07 DIAGNOSIS — L0231 Cutaneous abscess of buttock: Secondary | ICD-10-CM | POA: Insufficient documentation

## 2012-02-07 DIAGNOSIS — IMO0002 Reserved for concepts with insufficient information to code with codable children: Secondary | ICD-10-CM | POA: Insufficient documentation

## 2012-02-07 DIAGNOSIS — G8929 Other chronic pain: Secondary | ICD-10-CM | POA: Insufficient documentation

## 2012-02-07 LAB — ETHANOL: Alcohol, Ethyl (B): 308 mg/dL — ABNORMAL HIGH (ref 0–11)

## 2012-02-07 LAB — DIFFERENTIAL
Eosinophils Absolute: 0.1 10*3/uL (ref 0.0–0.7)
Eosinophils Relative: 1 % (ref 0–5)
Lymphocytes Relative: 33 % (ref 12–46)
Lymphs Abs: 2.9 10*3/uL (ref 0.7–4.0)
Monocytes Absolute: 0.5 10*3/uL (ref 0.1–1.0)
Monocytes Relative: 5 % (ref 3–12)

## 2012-02-07 LAB — COMPREHENSIVE METABOLIC PANEL
AST: 16 U/L (ref 0–37)
Albumin: 3.8 g/dL (ref 3.5–5.2)
Chloride: 102 mEq/L (ref 96–112)
Creatinine, Ser: 0.87 mg/dL (ref 0.50–1.35)
Potassium: 3.5 mEq/L (ref 3.5–5.1)
Total Bilirubin: 0.4 mg/dL (ref 0.3–1.2)
Total Protein: 7.7 g/dL (ref 6.0–8.3)

## 2012-02-07 LAB — CBC
HCT: 48.4 % (ref 39.0–52.0)
MCH: 32 pg (ref 26.0–34.0)
MCV: 87.5 fL (ref 78.0–100.0)
Platelets: 256 10*3/uL (ref 150–400)
RBC: 5.53 MIL/uL (ref 4.22–5.81)
WBC: 8.7 10*3/uL (ref 4.0–10.5)

## 2012-02-07 MED ORDER — LIDOCAINE HCL 2 % IJ SOLN: 5.0000 mL | Freq: Once | INTRAMUSCULAR | Status: DC

## 2012-02-07 NOTE — ED Notes (Signed)
Went back into room and assisted pt with pulling up blue scrubs.  Explained to pt that it is inappropriate to talk to staff about sexual things.  Pt stated he was sorry and that he is only doing it "because I'm horny."  Again explained to pt that is talking about sex and pt again said he is sorry.  Advised pt to let this RN know if he continues to speak to her sexually.

## 2012-02-07 NOTE — ED Notes (Signed)
Lab tech at bedside

## 2012-02-07 NOTE — ED Notes (Signed)
Removed cords and wires from room.  Sitter at bedside.  Lights dimmed.  Pt attempting to urinate in urinal.  Pt making sexual comments to nurses and sitters about being "horny, taking [place] to climax and having sex with 73 people."

## 2012-02-07 NOTE — ED Notes (Signed)
Pt. C/o of pain from ulcer on buttocks and abrasions along upper right leg.  Denies knowing how he got the abrasions/bruises. States he falls "all of the time". Reports hx of fascitis.  Pt. Crying stating "I don't want to lose my leg".

## 2012-02-07 NOTE — ED Notes (Addendum)
Per EMS, patient complaining of multiple abscesses on buttocks and right hip area.  Patient moaning and crying in triage; states that he has not seen his girlfriend in four months.  Patient reports being suicidal; reports having a plan.  Patient states that he is out of all his pain medications at home and that if he did have them, he would take them all at once.

## 2012-02-07 NOTE — ED Notes (Signed)
Pt. Has 12 inch scar across left buttocks. Reports from previous ulcer.

## 2012-02-07 NOTE — ED Notes (Signed)
AC & CN notified, "sitter will be sent", security sent to round on pt in room 25. To be wanded.

## 2012-02-08 ENCOUNTER — Telehealth: Payer: Self-pay | Admitting: Family Medicine

## 2012-02-08 ENCOUNTER — Telehealth: Payer: Self-pay | Admitting: Clinical

## 2012-02-08 LAB — SALICYLATE LEVEL: Salicylate Lvl: <2 mg/dL — ABNORMAL LOW (ref 2.8–20.0)

## 2012-02-08 LAB — ACETAMINOPHEN LEVEL: Acetaminophen (Tylenol), Serum: <15 ug/mL (ref 10–30)

## 2012-02-08 LAB — RAPID URINE DRUG SCREEN, HOSP PERFORMED
Cocaine: POSITIVE — AB
Opiates: NOT DETECTED

## 2012-02-08 NOTE — Telephone Encounter (Addendum)
Spoke with patient and he states he is doubling up on both pain meds. States Dr. Sherron Flemings Sondra Come knows about this. He takes 2 percocet 5 times daily and Oxycontin 2 tabs three times daily. States he is out of pain meds.    Paged Dr. Tye Savoy and she advises that he is not suppose to be doubling up on pain meds and he will need appointment to follow up about  sores on bottom and back. She will not prescribe more pain medication for him now.

## 2012-02-08 NOTE — ED Provider Notes (Signed)
History     CSN: [id number]  Arrival date & time 02/07/12  2147   First MD Initiated Contact with Patient 02/07/12 2300      Chief Complaint  Patient presents with  . Abscess  . Suicidal    (Consider location/radiation/quality/duration/timing/severity/associated sxs/prior treatment) Patient is a 56 y.o. male presenting with abscess.  Abscess  Pertinent negatives include no fever.   History provided by patient. Brought in by EMS complaining of multiple abscesses to his Buttocks. He ran out of his pain medications.  After long discussion, I found out he gets pain medications filled once a month and usually runs out after about 2 weeks. When he runs out he drinks alcohol. He became concerned tonight when his neighbor thought he should call an ambulance because he was complaining of pain due to abscesses. No one particular abscess has been more painful than another. No fevers or chills. No active drainage. About a week ago an abscess on his right buttock topics did drain.  There is no swelling to any of these areas. He denies IV drug abuse no other complaints. Past Medical History  Diagnosis Date  . DDD (degenerative disc disease)   . Necrotizing fasciitis   . Degenerative disc disease   . Hypertension   . Degenerative disc disease   . Hepatitis C   . Chronic pain     Past Surgical History  Procedure Date  . Hip surgery   . Appendectomy   . Ankle surgery     History reviewed. No pertinent family history.  History  Substance Use Topics  . Smoking status: Former Smoker -- 1.0 packs/day for 36 years    Quit date: 07/27/2011  . Smokeless tobacco: Former Neurosurgeon    Types: Chew  . Alcohol Use: Yes      Review of Systems  Constitutional: Negative for fever and chills.  HENT: Negative for neck pain and neck stiffness.   Eyes: Negative for pain.  Respiratory: Negative for shortness of breath.   Cardiovascular: Negative for chest pain.  Gastrointestinal: Negative for abdominal  pain.  Genitourinary: Negative for dysuria.  Musculoskeletal: Negative for back pain.  Skin: Positive for wound. Negative for color change and pallor.  Neurological: Negative for headaches.  All other systems reviewed and are negative.    Allergies  No known allergies  Home Medications   Current Outpatient Rx  Name Route Sig Dispense Refill  . AMLODIPINE BESYLATE 5 MG PO TABS Oral Take 1 tablet (5 mg total) by mouth daily. 31 tablet 11  . DEPEND UNDERGARMENTS MISC  Depends 23x36 pads, 1 pad daily as needed for incontinence. 30 each 11  . LEVOTHYROXINE SODIUM 137 MCG PO TABS Oral Take 1 tablet (137 mcg total) by mouth daily. 31 tablet 11  . LORATADINE 10 MG PO TABS Oral Take 1 tablet (10 mg total) by mouth daily. 30 tablet 11  . METOPROLOL TARTRATE 25 MG PO TABS Oral Take 1 tablet (25 mg total) by mouth 2 (two) times daily. 62 tablet 11  . OXYCODONE HCL ER 30 MG PO TB12 Oral Take 1 tablet (30 mg total) by mouth 3 (three) times daily. 90 tablet 0  . OXYCODONE-ACETAMINOPHEN 7.5-325 MG PO TABS Oral Take 1-2 tablets by mouth every 6 (six) hours as needed for pain. Patient may take 2 tab as needed at bedtime. 150 tablet 0  . TAMSULOSIN HCL 0.4 MG PO CAPS Oral Take 2 capsules (0.8 mg total) by mouth at bedtime. 62 capsule 11  BP 135/93  Pulse 73  Temp(Src) 98.1 F (36.7 C) (Oral)  Resp 18  SpO2 93%  Physical Exam  Constitutional: He is oriented to person, place, and time. He appears well-developed and well-nourished.  HENT:  Head: Normocephalic and atraumatic.  Eyes: Conjunctivae and EOM are normal. Pupils are equal, round, and reactive to light.  Neck: Trachea normal. Neck supple. No thyromegaly present.  Cardiovascular: Normal rate, regular rhythm, S1 normal, S2 normal and normal pulses.     No systolic murmur is present   No diastolic murmur is present  Pulses:      Radial pulses are 2+ on the right side, and 2+ on the left side.  Pulmonary/Chest: Effort normal and breath  sounds normal. He has no wheezes. He has no rhonchi. He has no rales. He exhibits no tenderness.  Abdominal: Soft. Normal appearance and bowel sounds are normal. There is no tenderness. There is no CVA tenderness and negative Murphy's sign.  Musculoskeletal:       Multiple healing abscesses to buttocks and right hip area. No areas of tenderness or fluctuance. Distal neurovascular intact.  Neurological: He is alert and oriented to person, place, and time. He has normal strength. No cranial nerve deficit or sensory deficit. GCS eye subscore is 4. GCS verbal subscore is 5. GCS motor subscore is 6.  Skin: Skin is warm and dry. No rash noted. He is not diaphoretic.  Psychiatric: His speech is normal.       Cooperative and appropriate. No suicidal ideation.    ED Course  Procedures (including critical care time)  Labs Reviewed  CBC - Abnormal; Notable for the following:    Hemoglobin 17.7 (*)    MCHC 36.6 (*)    All other components within normal limits  ETHANOL - Abnormal; Notable for the following:    Alcohol, Ethyl (B) 308 (*)    All other components within normal limits  SALICYLATE LEVEL - Abnormal; Notable for the following:    Salicylate Lvl <2.0 (*)    All other components within normal limits  COMPREHENSIVE METABOLIC PANEL  DIFFERENTIAL  ACETAMINOPHEN LEVEL  URINE RAPID DRUG SCREEN (HOSP PERFORMED)  URINE RAPID DRUG SCREEN (HOSP PERFORMED)      MDM   Nursing notes reviewed. I had a very long discussion with patient about his statements regarding suicide. Patient states that when asked if he was suicidal, he responds "I have lived a long life so I'm ready to die." She denies any suicidal plan or thoughts. He is not homicidal. No psychosis. Had ACT team evaluate patient. He contracts for safety. He is stable for discharge home. No indication for IVC or psychiatric admission.        Sunnie Nielsen, MD 02/08/12 [phone]

## 2012-02-08 NOTE — ED Notes (Signed)
Antibiotic ointment applied to wounds.

## 2012-02-08 NOTE — Telephone Encounter (Signed)
Clinical Child psychotherapist (CSW) received a vm this morning that pt left yesterday afternoon at 5:20pm stating that he (pt) was in severe pain and wanted to die. Pt was crying in the vm and was difficult to understand.  CSW contacted pt this morning and his phone went to vm. CSW looked in pt chart and observed that pt was brought to the hospital via EMS yesterday complaining of pain. While in the ED pt was seen by the ACT team, found to be intoxicated, denied SI/HI, completed a contract for safety, and was dc'ed home. Pt was also given a referral to follow up with Behavioral Health.  CSW spoke with her Asst. Director regarding the situation and was encouraged to simply follow up, remind pt of referral and who to call if pt begins to have SI. CSW contacted pt again and pt answered the phone and began to cry on the phone. Pt stated he was not suicidal and did not want to die however was in a lot of pain. CSW encouraged the pt to go to the hospital if the pain continued. CSW reminded pt to call EMS or have someone bring him to the hospital, CSW also reminded pt to follow up with [facility] however pt stated he would not follow up for counseling. Pt encouraged pt seek counseling/psychiatrist as this CSW is not able to assist/meet needs. Pt understood and aware of who to call if pain is unmanageable/ or presents with SI.   Theresia Bough, MSW, Theresia Majors [phone]

## 2012-02-08 NOTE — ED Notes (Signed)
Placed call to [facility] for transport back home 

## 2012-02-08 NOTE — Discharge Instructions (Signed)

## 2012-02-08 NOTE — Telephone Encounter (Signed)
Will forward message to MD.
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
## 2012-02-08 NOTE — Telephone Encounter (Signed)
Pt is asking for more oxycodone - he is desperate!   Went to ED last night because of sores on his bottom and back.  He has had to double up on his meds.

## 2012-02-08 NOTE — Telephone Encounter (Signed)
Gave patient message from MD  and he states he went to ED last night and they didn't do anything for him.  Offered to schedule appointment here today and he declines. York Spaniel he will see her on the  June 21

## 2012-02-11 ENCOUNTER — Encounter (HOSPITAL_COMMUNITY): Payer: Self-pay | Admitting: Family Medicine

## 2012-02-11 ENCOUNTER — Emergency Department (HOSPITAL_COMMUNITY)
Admission: EM | Admit: 2012-02-11 | Discharge: 2012-02-12 | Disposition: A | Discharge status: Home / Self Care | Payer: Medicaid Other | Source: Home / Self Care | Attending: Emergency Medicine | Admitting: Emergency Medicine

## 2012-02-11 DIAGNOSIS — Z79899 Other long term (current) drug therapy: Secondary | ICD-10-CM | POA: Insufficient documentation

## 2012-02-11 DIAGNOSIS — IMO0002 Reserved for concepts with insufficient information to code with codable children: Secondary | ICD-10-CM | POA: Insufficient documentation

## 2012-02-11 DIAGNOSIS — G20 Parkinson's disease: Secondary | ICD-10-CM | POA: Insufficient documentation

## 2012-02-11 DIAGNOSIS — M549 Dorsalgia, unspecified: Secondary | ICD-10-CM | POA: Insufficient documentation

## 2012-02-11 DIAGNOSIS — R45851 Suicidal ideations: Secondary | ICD-10-CM | POA: Insufficient documentation

## 2012-02-11 DIAGNOSIS — I10 Essential (primary) hypertension: Secondary | ICD-10-CM | POA: Insufficient documentation

## 2012-02-11 DIAGNOSIS — G20A1 Parkinson's disease without dyskinesia, without mention of fluctuations: Secondary | ICD-10-CM | POA: Insufficient documentation

## 2012-02-11 DIAGNOSIS — M25569 Pain in unspecified knee: Secondary | ICD-10-CM | POA: Insufficient documentation

## 2012-02-11 DIAGNOSIS — Z760 Encounter for issue of repeat prescription: Secondary | ICD-10-CM | POA: Insufficient documentation

## 2012-02-11 DIAGNOSIS — Z87891 Personal history of nicotine dependence: Secondary | ICD-10-CM | POA: Insufficient documentation

## 2012-02-11 DIAGNOSIS — G8929 Other chronic pain: Secondary | ICD-10-CM | POA: Insufficient documentation

## 2012-02-11 DIAGNOSIS — M1710 Unilateral primary osteoarthritis, unspecified knee: Secondary | ICD-10-CM | POA: Insufficient documentation

## 2012-02-11 DIAGNOSIS — M169 Osteoarthritis of hip, unspecified: Secondary | ICD-10-CM

## 2012-02-11 HISTORY — DX: Parkinson's disease without dyskinesia, without mention of fluctuations: G20.A1

## 2012-02-11 HISTORY — DX: Parkinson's disease: G20

## 2012-02-11 LAB — RAPID URINE DRUG SCREEN, HOSP PERFORMED
Barbiturates: NOT DETECTED
Cocaine: POSITIVE — AB

## 2012-02-11 LAB — COMPREHENSIVE METABOLIC PANEL
ALT: 10 U/L (ref 0–53)
BUN: 11 mg/dL (ref 6–23)
CO2: 24 mEq/L (ref 19–32)
Calcium: 8.6 mg/dL (ref 8.4–10.5)
Creatinine, Ser: 0.81 mg/dL (ref 0.50–1.35)
GFR calc Af Amer: >90 mL/min (ref >90)
GFR calc non Af Amer: >90 mL/min (ref >90)
Glucose, Bld: 97 mg/dL (ref 70–99)
Sodium: 144 mEq/L (ref 135–145)

## 2012-02-11 LAB — CBC
HCT: 51.3 % (ref 39.0–52.0)
Hemoglobin: 17.9 g/dL — ABNORMAL HIGH (ref 13.0–17.0)
MCH: 31.1 pg (ref 26.0–34.0)
MCHC: 34.9 g/dL (ref 30.0–36.0)
MCV: 89.1 fL (ref 78.0–100.0)
RBC: 5.76 MIL/uL (ref 4.22–5.81)

## 2012-02-11 LAB — ETHANOL: Alcohol, Ethyl (B): 265 mg/dL — ABNORMAL HIGH (ref 0–11)

## 2012-02-11 MED ORDER — POTASSIUM CHLORIDE CRYS ER 20 MEQ PO TBCR
40.0000 meq | EXTENDED_RELEASE_TABLET | Freq: Once | ORAL | Status: AC
  Administered 2012-02-11: 40 meq via ORAL
  Filled 2012-02-11: qty 2

## 2012-02-11 MED ORDER — AMLODIPINE BESYLATE 5 MG PO TABS
5.0000 mg | ORAL_TABLET | Freq: Every day | ORAL | Status: DC
  Administered 2012-02-11: 5 mg via ORAL
  Filled 2012-02-11 (×3): qty 1

## 2012-02-11 MED ORDER — DEPEND UNDERGARMENTS MISC: Status: DC

## 2012-02-11 MED ORDER — OXYCODONE HCL 10 MG PO TB12
30.0000 mg | ORAL_TABLET | Freq: Three times a day (TID) | ORAL | Status: DC
  Administered 2012-02-11: 30 mg via ORAL
  Filled 2012-02-11: qty 3

## 2012-02-11 MED ORDER — THIAMINE HCL 100 MG/ML IJ SOLN: 100.0000 mg | Freq: Every day | INTRAMUSCULAR | Status: DC

## 2012-02-11 MED ORDER — TAMSULOSIN HCL 0.4 MG PO CAPS
0.8000 mg | ORAL_CAPSULE | Freq: Every day | ORAL | Status: DC
  Administered 2012-02-11: 0.8 mg via ORAL
  Filled 2012-02-11: qty 2

## 2012-02-11 MED ORDER — ONDANSETRON HCL 4 MG PO TABS: 4.0000 mg | ORAL_TABLET | Freq: Three times a day (TID) | ORAL | Status: DC | PRN

## 2012-02-11 MED ORDER — FOLIC ACID 1 MG PO TABS: 1.0000 mg | ORAL_TABLET | Freq: Every day | ORAL | Status: DC

## 2012-02-11 MED ORDER — OXYCODONE HCL 5 MG PO TABS
2.5000 mg | ORAL_TABLET | Freq: Four times a day (QID) | ORAL | Status: DC | PRN
  Administered 2012-02-11 – 2012-02-12 (×2): 5 mg via ORAL
  Filled 2012-02-11 (×2): qty 1

## 2012-02-11 MED ORDER — METOPROLOL TARTRATE 25 MG PO TABS
25.0000 mg | ORAL_TABLET | Freq: Two times a day (BID) | ORAL | Status: DC
  Administered 2012-02-11: 25 mg via ORAL
  Filled 2012-02-11: qty 1

## 2012-02-11 MED ORDER — VITAMIN B-1 100 MG PO TABS: 100.0000 mg | ORAL_TABLET | Freq: Every day | ORAL | Status: DC

## 2012-02-11 MED ORDER — NICOTINE 21 MG/24HR TD PT24
21.0000 mg | MEDICATED_PATCH | Freq: Every day | TRANSDERMAL | Status: DC
  Administered 2012-02-11: 21 mg via TRANSDERMAL
  Filled 2012-02-11: qty 1

## 2012-02-11 MED ORDER — LORAZEPAM 1 MG PO TABS
1.0000 mg | ORAL_TABLET | Freq: Four times a day (QID) | ORAL | Status: DC | PRN
  Administered 2012-02-11: 1 mg via ORAL
  Filled 2012-02-11: qty 1

## 2012-02-11 MED ORDER — LEVOTHYROXINE SODIUM 137 MCG PO TABS
137.0000 ug | ORAL_TABLET | Freq: Every day | ORAL | Status: DC
  Administered 2012-02-11: 137 ug via ORAL
  Filled 2012-02-11 (×3): qty 1

## 2012-02-11 MED ORDER — OXYCODONE-ACETAMINOPHEN 5-325 MG PO TABS
1.0000 | ORAL_TABLET | Freq: Four times a day (QID) | ORAL | Status: DC | PRN
  Administered 2012-02-11: 2 via ORAL
  Administered 2012-02-12: 1 via ORAL
  Filled 2012-02-11 (×2): qty 2

## 2012-02-11 MED ORDER — OXYCODONE-ACETAMINOPHEN 7.5-325 MG PO TABS
1.0000 | ORAL_TABLET | Freq: Four times a day (QID) | ORAL | Status: DC | PRN
Start: 2012-02-11 — End: 2012-02-11

## 2012-02-11 MED ORDER — ADULT MULTIVITAMIN W/MINERALS CH: 1.0000 | ORAL_TABLET | Freq: Every day | ORAL | Status: DC

## 2012-02-11 MED ORDER — LORAZEPAM 2 MG/ML IJ SOLN
1.0000 mg | Freq: Four times a day (QID) | INTRAMUSCULAR | Status: DC | PRN
  Administered 2012-02-12: 1 mg via INTRAVENOUS
  Filled 2012-02-11: qty 1

## 2012-02-11 NOTE — ED Notes (Signed)
Pt. Is not able to use the restroom at this time.  

## 2012-02-11 NOTE — Progress Notes (Signed)
Tele Psych Consult completed. Pt with continued restlessness, sitter maintained at the bedside.

## 2012-02-11 NOTE — ED Provider Notes (Addendum)
History     CSN: [id number]  Arrival date & time 02/11/12  1732   First MD Initiated Contact with Patient 02/11/12 1943      Chief Complaint  Patient presents with  . V70.1  . Pressure Ulcer    (Consider location/radiation/quality/duration/timing/severity/associated sxs/prior treatment) HPI Patient feeling suicidal due to chronic pain. He states that he medications off of alcohol when he runs out of pain medicine he suffers from chronic pain at his back neck and back and left knee. Pain is going on for several years. Worse with movement. Patient has used all of his pain medication a lot of him from family practice [facility] and has been dismissed from several pain clinics. He reports that he ran out of pain medicine one week ago. No other associated symptoms. He reports that he would kill himself "any way I could." Past Medical History  Diagnosis Date  . DDD (degenerative disc disease)   . Necrotizing fasciitis   . Degenerative disc disease   . Hypertension   . Degenerative disc disease   . Hepatitis C   . Chronic pain   . DDD (degenerative disc disease)   . Parkinson disease     Past Surgical History  Procedure Date  . Hip surgery   . Appendectomy   . Ankle surgery     History reviewed. No pertinent family history.  History  Substance Use Topics  . Smoking status: Former Smoker -- 1.0 packs/day for 36 years    Quit date: 07/27/2011  . Smokeless tobacco: Former Neurosurgeon    Types: Chew  . Alcohol Use: Yes      Review of Systems  Constitutional: Negative.   HENT: Negative.   Respiratory: Negative.   Cardiovascular: Negative.   Gastrointestinal: Negative.   Musculoskeletal: Positive for back pain and arthralgias.  Skin: Negative.   Neurological: Negative.   Hematological: Negative.   Psychiatric/Behavioral: Negative.   All other systems reviewed and are negative.    Allergies  No known allergies  Home Medications   Current Outpatient Rx  Name Route Sig  Dispense Refill  . AMLODIPINE BESYLATE 5 MG PO TABS Oral Take 1 tablet (5 mg total) by mouth daily. 31 tablet 11  . DEPEND UNDERGARMENTS MISC  Depends 23x36 pads, 1 pad daily as needed for incontinence.    Marland Kitchen LEVOTHYROXINE SODIUM 137 MCG PO TABS Oral Take 1 tablet (137 mcg total) by mouth daily. 31 tablet 11  . METOPROLOL TARTRATE 25 MG PO TABS Oral Take 1 tablet (25 mg total) by mouth 2 (two) times daily. 62 tablet 11  . OXYCODONE HCL ER 30 MG PO TB12 Oral Take 1 tablet (30 mg total) by mouth 3 (three) times daily. 90 tablet 0  . OXYCODONE-ACETAMINOPHEN 7.5-325 MG PO TABS Oral Take 1-2 tablets by mouth every 6 (six) hours as needed for pain. Patient may take 2 tab as needed at bedtime. 150 tablet 0  . TAMSULOSIN HCL 0.4 MG PO CAPS Oral Take 2 capsules (0.8 mg total) by mouth at bedtime. 62 capsule 11    BP 114/69  Pulse 78  Temp(Src) 98.4 F (36.9 C) (Oral)  Resp 16  SpO2 98%  Physical Exam  Nursing note and vitals reviewed. Constitutional: He appears well-developed and well-nourished. He appears distressed.       Appears depressed and uncomfortable  HENT:  Head: Normocephalic and atraumatic.  Eyes: Conjunctivae are normal. Pupils are equal, round, and reactive to light.  Neck: Neck supple. No tracheal deviation present.  No thyromegaly present.  Cardiovascular: Normal rate and regular rhythm.   No murmur heard. Pulmonary/Chest: Effort normal and breath sounds normal.  Abdominal: Soft. Bowel sounds are normal. He exhibits no distension. There is no tenderness.  Musculoskeletal: Normal range of motion. He exhibits no edema and no tenderness.  Neurological: He is alert. Coordination normal.  Skin: Skin is warm and dry. No rash noted.       Surgical scars at buttocks no open wounds  Psychiatric: He has a normal mood and affect.    ED Course  Procedures (including critical care time)  Labs Reviewed  CBC - Abnormal; Notable for the following:    Hemoglobin 17.9 (*)    All other  components within normal limits  COMPREHENSIVE METABOLIC PANEL - Abnormal; Notable for the following:    Potassium 3.3 (*)    All other components within normal limits  ETHANOL - Abnormal; Notable for the following:    Alcohol, Ethyl (B) 265 (*)    All other components within normal limits  URINE RAPID DRUG SCREEN (HOSP PERFORMED) - Abnormal; Notable for the following:    Cocaine POSITIVE (*)    All other components within normal limits   No results found.   No diagnosis found.  Results for orders placed during the hospital encounter of 02/11/12  CBC      Component Value Range   WBC 8.5  4.0 - 10.5 (K/uL)   RBC 5.76  4.22 - 5.81 (MIL/uL)   Hemoglobin 17.9 (*) 13.0 - 17.0 (g/dL)   HCT 78.2  95.6 - 21.3 (%)   MCV 89.1  78.0 - 100.0 (fL)   MCH 31.1  26.0 - 34.0 (pg)   MCHC 34.9  30.0 - 36.0 (g/dL)   RDW 08.6  57.8 - 46.9 (%)   Platelets 257  150 - 400 (K/uL)  COMPREHENSIVE METABOLIC PANEL      Component Value Range   Sodium 144  135 - 145 (mEq/L)   Potassium 3.3 (*) 3.5 - 5.1 (mEq/L)   Chloride 105  96 - 112 (mEq/L)   CO2 24  19 - 32 (mEq/L)   Glucose, Bld 97  70 - 99 (mg/dL)   BUN 11  6 - 23 (mg/dL)   Creatinine, Ser 6.29  0.50 - 1.35 (mg/dL)   Calcium 8.6  8.4 - 52.8 (mg/dL)   Total Protein 7.6  6.0 - 8.3 (g/dL)   Albumin 3.7  3.5 - 5.2 (g/dL)   AST 27  0 - 37 (U/L)   ALT 10  0 - 53 (U/L)   Alkaline Phosphatase 81  39 - 117 (U/L)   Total Bilirubin 0.3  0.3 - 1.2 (mg/dL)   GFR calc non Af Amer >90  >90 (mL/min)   GFR calc Af Amer >90  >90 (mL/min)  ETHANOL      Component Value Range   Alcohol, Ethyl (B) 265 (*) 0 - 11 (mg/dL)  URINE RAPID DRUG SCREEN (HOSP PERFORMED)      Component Value Range   Opiates NONE DETECTED  NONE DETECTED    Cocaine POSITIVE (*) NONE DETECTED    Benzodiazepines NONE DETECTED  NONE DETECTED    Amphetamines NONE DETECTED  NONE DETECTED    Tetrahydrocannabinol NONE DETECTED  NONE DETECTED    Barbiturates NONE DETECTED  NONE DETECTED     No results found.   MDM  Pt felt to be moderate suicide risk and also suffers from poly substance abuse.  I feel would benefit from  inpt coucilling/detox  Dx#1 Suicidal ideation  #2 poly substance abuse     Doug Sou, MD 02/11/12 1610  Doug Sou, MD 02/12/12 [id number]
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
## 2012-02-11 NOTE — ED Notes (Addendum)
Pt reports having a plan to take all of his pain medications to kill himself. Reports that "If I had a gun I would kill my neighbor."  Denies any hallucinations.  Reports drinking approx 1 pint of vodka today, denies any other drug use.

## 2012-02-11 NOTE — ED Notes (Signed)
Per EMS: Pt lives alone. Reports pt was seen 3 days ago at [facility] for pressure ulcers on bottom.  Reports SI today.

## 2012-02-11 NOTE — ED Notes (Signed)
Patient's belongings are in locker 26.   

## 2012-02-12 ENCOUNTER — Encounter (HOSPITAL_COMMUNITY): Payer: Self-pay

## 2012-02-12 ENCOUNTER — Emergency Department (HOSPITAL_COMMUNITY)
Admission: EM | Admit: 2012-02-12 | Discharge: 2012-02-12 | Disposition: A | Discharge status: Home / Self Care | Payer: Medicaid Other | Attending: Emergency Medicine | Admitting: Emergency Medicine

## 2012-02-12 DIAGNOSIS — G8929 Other chronic pain: Secondary | ICD-10-CM

## 2012-02-12 MED ORDER — LORAZEPAM 1 MG PO TABS
1.0000 mg | ORAL_TABLET | Freq: Once | ORAL | Status: DC
  Filled 2012-02-12: qty 1

## 2012-02-12 MED ORDER — OXYCODONE HCL 10 MG PO TB12: 30.0000 mg | ORAL_TABLET | Freq: Two times a day (BID) | ORAL | Status: DC

## 2012-02-12 MED ORDER — OXYCODONE-ACETAMINOPHEN 5-325 MG PO TABS
1.0000 | ORAL_TABLET | ORAL | Status: AC
  Administered 2012-02-12: 1 via ORAL
  Filled 2012-02-12: qty 1

## 2012-02-12 MED ORDER — OXYCODONE HCL 10 MG PO TB12
30.0000 mg | ORAL_TABLET | ORAL | Status: AC
  Administered 2012-02-12: 30 mg via ORAL
  Filled 2012-02-12: qty 3
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
## 2012-02-12 NOTE — Progress Notes (Signed)
Pt cleared by psych. Awaiting medical clearance. Pt requests transportation assistance, passes available in AM

## 2012-02-12 NOTE — Discharge Instructions (Signed)
Degenerative Arthritis You have osteoarthritis. This is the wear and tear arthritis that comes with aging. It is also called degenerative arthritis. This is common in people past middle age. It is caused by stress on the joints. The large weight bearing joints of the lower extremities are most often affected. The knees, hips, back, neck, and hands can become painful, swollen, and stiff. This is the most common type of arthritis. It comes on with age, carrying too much weight, or from an injury. Treatment includes resting the sore joint until the pain and swelling improve. Crutches or a walker may be needed for severe flares. Only take over-the-counter or prescription medicines for pain, discomfort, or fever as directed by your caregiver. Local heat therapy may improve motion. Cortisone shots into the joint are sometimes used to reduce pain and swelling during flares. Osteoarthritis is usually not crippling and progresses slowly. There are things you can do to decrease pain:  Avoid high impact activities.   Exercise regularly.   Low impact exercises such as walking, biking and swimming help to keep the muscles strong and keep normal joint function.   Stretching helps to keep your range of motion.   Lose weight if you are overweight. This reduces joint stress.  In severe cases when you have pain at rest or increasing disability, joint surgery may be helpful. See your caregiver for follow-up treatment as recommended.  SEEK IMMEDIATE MEDICAL CARE IF:   You have severe joint pain.   Marked swelling and redness in your joint develops.   You develop a high fever.  Document Released: 08/23/2005 Document Revised: 08/12/2011 Document Reviewed: 01/23/2007 [facility] Patient Information 2012 [place], [place].Arthritis, Nonspecific Arthritis is pain, redness, warmth, or puffiness (swelling) of a joint. The joint may be stiff or hurt when you move it. One or more joints may be affected. There are many types  of arthritis. Your doctor may not know what type you have right away. The most common cause of arthritis is wear and tear on the joint (osteoarthritis). HOME CARE   Only take medicine as told by your doctor.   Rest the joint as much as possible.   Raise (elevate) your joint if it is puffy.   Use crutches if the painful joint is in your leg.   Drink enough water and fluids to keep your pee (urine) clear or pale yellow.   Follow your doctor's instructions for diet.   Use cold packs for very bad joint pain for 10 to 15 minutes every hour. Ask your doctor if it is okay for you to use hot packs.   Exercise as told by your doctor.   Take a warm shower if you have stiffness in the morning.   Move your sore joints throughout the day.  GET HELP RIGHT AWAY IF:   You do not feel better in 24 hours or are getting worse.   You are having side effects from your medicine.   You are not getting better with treatment.   You have a fever.   You have very bad joint pain, puffiness, or redness.   Many joints become painful and puffy.   You have very bad back pain or leg weakness.   You cannot control when you poop (bowel movement) or pee (urinate).  MAKE SURE YOU:   Understand these instructions.   Will watch your condition.   Will get help right away if you are not doing well or get worse.  Document Released: 11/17/2009 Document Revised:  08/12/2011 Document Reviewed: 11/17/2009 [facility] Patient Information 2012 [place], [place].Hip Pain The hips join the upper legs to the lower pelvis. The bones, cartilage, tendons, and muscles of the hip joint perform a lot of work each day holding your body weight and allowing you to move around. Hip pain is a common symptom. It can range from a minor ache to severe pain on 1 or both hips. Pain may be felt on the inside of the hip joint near the groin, or the outside near the buttocks and upper thigh. There may be swelling or stiffness as well. It  occurs more often when a person walks or performs activity. There are many reasons hip pain can develop. CAUSES  It is important to work with your caregiver to identify the cause since many conditions can impact the bones, cartilage, muscles, and tendons of the hips. Causes for hip pain include:  Broken (fractured) bones.   Separation of the thighbone from the hip socket (dislocation).   Torn cartilage of the hip joint.   Swelling (inflammation) of a tendon (tendonitis), the sac within the hip joint (bursitis), or a joint.   A weakening in the abdominal wall (hernia), affecting the nerves to the hip.   Arthritis in the hip joint or lining of the hip joint.   Pinched nerves in the back, hip, or upper thigh.   A bulging disc in the spine (herniated disc).   Rarely, bone infection or cancer.  DIAGNOSIS  The location of your hip pain will help your caregiver understand what may be causing the pain. A diagnosis is based on your medical history, your symptoms, results from your physical exam, and results from diagnostic tests. Diagnostic tests may include X-ray exams, a computerized magnetic scan (magnetic resonance imaging, MRI), or bone scan. TREATMENT  Treatment will depend on the cause of your hip pain. Treatment may include:  Limiting activities and resting until symptoms improve.   Crutches or other walking supports (a cane or brace).   Ice, elevation, and compression.   Physical therapy or home exercises.   Shoe inserts or special shoes.   Losing weight.   Medications to reduce pain.   Undergoing surgery.  HOME CARE INSTRUCTIONS   Only take over-the-counter or prescription medicines for pain, discomfort, or fever as directed by your caregiver.   Put ice on the injured area:   Put ice in a plastic bag.   Place a towel between your skin and the bag.   Leave the ice on for 15 to 20 minutes at a time, 3 to 4 times a day.   Keep your leg raised (elevated) when  possible to lessen swelling.   Avoid activities that cause pain.   Follow specific exercises as directed by your caregiver.   Sleep with a pillow between your legs on your most comfortable side.   Record how often you have hip pain, the location of the pain, and what it feels like. This information may be helpful to you and your caregiver.   Ask your caregiver about returning to work or sports and whether you should drive.   Follow up with your caregiver for further exams, therapy, or testing as directed.  SEEK MEDICAL CARE IF:   Your pain or swelling continues or worsens after 1 week.   You are feeling unwell or have chills.   You have increasing difficulty with walking.   You have a loss of sensation or other new symptoms.   You have questions or  concerns.  SEEK IMMEDIATE MEDICAL CARE IF:   You cannot put weight on the affected hip.   You have fallen.   You have a sudden increase in pain and swelling in your hip.   You have a fever.  MAKE SURE YOU:   Understand these instructions.   Will watch your condition.   Will get help right away if you are not doing well or get worse.  Document Released: 02/10/2010 Document Revised: 08/12/2011 Document Reviewed: 02/10/2010 [facility] Patient Information 2012 [place], [place].

## 2012-02-12 NOTE — Discharge Instructions (Signed)
Read the information below.  Please call your primary care provider to schedule a close follow up appointment for medication refills and management of your chronic pain.  If you have any change in your chronic pain, and fevers, leg swelling, weakness or numbness of your legs, or uncontrolled pain, return to the ER for a recheck. You may return to the ER at any time for worsening condition or any new symptoms that concern you.   Chronic Pain Management Managing chronic pain is not easy. The goal is to provide as much pain relief as possible. There are emotional as well as physical problems. Chronic pain may lead to symptoms of depression which magnify those of the pain. Problems may include:  Anxiety.   Sleep disturbances.   Confused thinking.   Feeling cranky.   Fatigue.   Weight gain or loss.  Identify the source of the pain first, if possible. The pain may be masking another problem. Try to find a pain management specialist or clinic. Work with a team to create a treatment plan for you. MEDICATIONS  May include narcotics or opioids. Larger than normal doses may be needed to control your pain.   Drugs for depression may help.   Over-the-counter medicines may help for some conditions. These drugs may be used along with others for better pain relief.   May be injected into sites such as the spine and joints. Injections may have to be repeated if they wear off.  THERAPY MAY INCLUDE:  Working with a physical therapist to keep from getting stiff.   Regular, gentle exercise.   Cognitive or behavioral therapy.   Using complementary or integrative medicine such as:   Acupuncture.   Massage, Reiki, or Rolfing.   Aroma, color, light, or sound therapy.   Group support.  FOR MORE INFORMATION [email or web address]. American Chronic [employer] Association [email or web address]. Document Released: 09/30/2004 Document Revised: 08/12/2011 Document Reviewed: 11/09/2007 [facility]  Patient Information 2012 [place], [place].  Chronic Pain Chronic pain can be defined as pain that is lasting, off and on, and lasts for 3 to 6 months or longer. Many things cause chronic pain, which can make it difficult to make a discrete diagnosis. There are many treatment options available for chronic pain. However, finding a treatment that works well for you may require trying various approaches until a suitable one is found. CAUSES  In some types of chronic medical conditions, the pain is caused by a normal pain response within the body. A normal pain response helps the body identify illness or injury and prevent further damage from being done. In these cases, the cause of the pain may be identified and treated, even if it may not be cured completely. Examples of chronic conditions which can cause chronic pain include:  Inflammation of the joints (arthritis).   Back pain or neck pain (including bulging or herniated disks).   Migraine headaches.   Cancer.  In some other types of chronic pain syndromes, the pain is caused by an abnormal pain response within the body. An abnormal pain response is present when there is no ongoing cause (or stimulus) for the pain, or when the cause of the pain is arising from the nerves or nervous system itself. Examples of conditions which can cause chronic pain due to an abnormal pain response include:  Fibromyalgia.   Reflex sympathetic dystrophy (RSD).   Neuropathy (when the nerves themselves are damaged, and may cause pain).  DIAGNOSIS  Your caregiver will help diagnose your condition over time.  In many cases, the initial focus will be on excluding conditions that could be causing the pain. Depending on your symptoms, your caregiver may order some tests to diagnose your condition. Some of these tests include:  Blood tests.   Computerized X-ray scans (CT scan).   Computerized magnetic scans (MRI).   X-rays.   Ultrasounds.   Nerve conduction  studies.   Consultation with other physicians or specialists.  TREATMENT  There are many treatment options for people suffering from chronic pain. Finding a treatment that works well may take time.   You may be referred to a pain management specialist.   You may be put on medication to help with the pain. Unfortunately, some medications (such as opiate medications) may not be very effective in cases where chronic pain is due to abnormal pain responses. Finding the right medications can take some time.   Adjunctive therapies may be used to provide additional relief and improve a patient's quality of life. These therapies include:   Mindfulness meditation.   Acupuncture.   Biofeedback.   Cognitive-behavioral therapy.   In certain cases, surgical interventions may be attempted.  HOME CARE INSTRUCTIONS   Make sure you understand these instructions prior to discharge.   Ask any questions and share any further concerns you have with your caregiver prior to discharge.   Take all medications as directed by your caregiver.   Keep all follow-up appointments.  SEEK MEDICAL CARE IF:   Your pain gets worse.   You develop a new pain that was not present before.   You cannot tolerate any medications prescribed by your caregiver.   You develop new symptoms since your last visit with your caregiver.  SEEK IMMEDIATE MEDICAL CARE IF:   You develop muscular weakness.   You have decreased sensation or numbness.   You lose control of bowel or bladder function.   Your pain suddenly gets much worse.   You have an oral temperature above 102 F (38.9 C), not controlled by medication.   You develop shaking chills, confusion, chest pain, or shortness of breath.  Document Released: 05/15/2002 Document Revised: 08/12/2011 Document Reviewed: 08/21/2008 [facility] Patient Information 2012 [place], [place].

## 2012-02-12 NOTE — ED Provider Notes (Signed)
History     CSN: [id number]  Arrival date & time 02/12/12  4098   First MD Initiated Contact with Patient 02/12/12 [phone]      Chief Complaint  Patient presents with  . Medication Refill    (Consider location/radiation/quality/duration/timing/severity/associated sxs/prior treatment) HPI Comments: Patient with chronic pain who ran out of his home medications one week ago after taking his pills more quickly than is prescribed presents to ED for pain control.  Patient has had chronic pain in his left leg, left hip and low back for three years following "flesh eating" bacterial infection and fasciotomy, difficult wound healing.  States his pain is completely unchanged from his normal chronic pain.  No new injuries, no fevers, no weakness or numbness of the extremities, no change in the quality or intensity of his pain.  Pt has been drinking alcohol recently, which he states he only uses when he is out of his pain medication.  Pt was seen in ED for suicidal ideation because of his chronic pain.  Pt states that he is "over this" now and denies SI.    The history is provided by the patient.    Past Medical History  Diagnosis Date  . DDD (degenerative disc disease)   . Necrotizing fasciitis   . Degenerative disc disease   . Hypertension   . Degenerative disc disease   . Hepatitis C   . Chronic pain   . DDD (degenerative disc disease)   . Parkinson disease     Past Surgical History  Procedure Date  . Hip surgery   . Appendectomy   . Ankle surgery     History reviewed. No pertinent family history.  History  Substance Use Topics  . Smoking status: Former Smoker -- 1.0 packs/day for 36 years    Quit date: 07/27/2011  . Smokeless tobacco: Former Neurosurgeon    Types: Chew  . Alcohol Use: Yes      Review of Systems  Constitutional: Negative for fever and chills.  Cardiovascular: Negative for chest pain and leg swelling.  Gastrointestinal: Negative for abdominal pain.    Musculoskeletal: Positive for back pain and arthralgias.  Neurological: Negative for weakness and numbness.    Allergies  No known allergies  Home Medications   Current Outpatient Rx  Name Route Sig Dispense Refill  . AMLODIPINE BESYLATE 5 MG PO TABS Oral Take 1 tablet (5 mg total) by mouth daily. 31 tablet 11  . DEPEND UNDERGARMENTS MISC  Depends 23x36 pads, 1 pad daily as needed for incontinence.    Marland Kitchen LEVOTHYROXINE SODIUM 137 MCG PO TABS Oral Take 1 tablet (137 mcg total) by mouth daily. 31 tablet 11  . METOPROLOL TARTRATE 25 MG PO TABS Oral Take 1 tablet (25 mg total) by mouth 2 (two) times daily. 62 tablet 11  . OXYCODONE HCL ER 30 MG PO TB12 Oral Take 1 tablet (30 mg total) by mouth 3 (three) times daily. 90 tablet 0  . OXYCODONE-ACETAMINOPHEN 7.5-325 MG PO TABS Oral Take 1-2 tablets by mouth every 6 (six) hours as needed for pain. Patient may take 2 tab as needed at bedtime. 150 tablet 0  . TAMSULOSIN HCL 0.4 MG PO CAPS Oral Take 2 capsules (0.8 mg total) by mouth at bedtime. 62 capsule 11    BP 145/89  Pulse 96  Temp(Src) 98.7 F (37.1 C) (Oral)  Resp 18  SpO2 100%  Physical Exam  Nursing note and vitals reviewed. Constitutional: He is oriented to person, place, and  time. He appears well-developed and well-nourished. No distress.  HENT:  Head: Normocephalic and atraumatic.  Neck: Neck supple.  Cardiovascular: Normal rate, regular rhythm and normal heart sounds.   Pulmonary/Chest: Breath sounds normal. No respiratory distress. He has no wheezes. He has no rales. He exhibits no tenderness.  Musculoskeletal: He exhibits no edema and no tenderness.       Cervical back: He exhibits no tenderness.       Thoracic back: He exhibits no tenderness.       Lumbar back: He exhibits no tenderness.       Left knee and lower leg with old scarring.  No erythema, edema, warmth, tenderness.  Lower extremities: strength 5/5, sensation intact, distal pulses intact.   Neurological: He is  alert and oriented to person, place, and time.  Skin: He is not diaphoretic.    ED Course  Procedures (including critical care time)  Labs Reviewed - No data to display No results found.   1. Chronic pain       MDM  Patient with chronic pain that is unchanged.  DEA database shows all prescriptions coming from [facility] where his PCP is located.  He has been taking the medications "too fast" he states but knows he needs to follow up there for further pain management.  I have given him a dose of his home medications here.  Pt denies SI.  Pt d/c home with PCP follow up, return precautions.  Patient verbalizes understanding and agrees with plan.          Rise Patience, [place] 02/12/12 1358

## 2012-02-12 NOTE — ED Provider Notes (Signed)
Medical screening examination/treatment/procedure(s) were performed by non-physician practitioner and as supervising physician I was immediately available for consultation/collaboration.   Dione Booze, MD 02/12/12 1537

## 2012-02-12 NOTE — ED Notes (Signed)
Pt in from home states wants a medication refill on percocet

## 2012-02-14 ENCOUNTER — Emergency Department (HOSPITAL_COMMUNITY)
Admission: EM | Admit: 2012-02-14 | Discharge: 2012-02-16 | Disposition: A | Discharge status: Home / Self Care | Payer: Medicaid Other | Attending: Emergency Medicine | Admitting: Emergency Medicine

## 2012-02-14 ENCOUNTER — Encounter (HOSPITAL_COMMUNITY): Payer: Self-pay | Admitting: *Deleted

## 2012-02-14 DIAGNOSIS — Z87891 Personal history of nicotine dependence: Secondary | ICD-10-CM | POA: Insufficient documentation

## 2012-02-14 DIAGNOSIS — G8929 Other chronic pain: Secondary | ICD-10-CM | POA: Insufficient documentation

## 2012-02-14 DIAGNOSIS — G20A1 Parkinson's disease without dyskinesia, without mention of fluctuations: Secondary | ICD-10-CM | POA: Insufficient documentation

## 2012-02-14 DIAGNOSIS — F329 Major depressive disorder, single episode, unspecified: Secondary | ICD-10-CM | POA: Insufficient documentation

## 2012-02-14 DIAGNOSIS — I10 Essential (primary) hypertension: Secondary | ICD-10-CM | POA: Insufficient documentation

## 2012-02-14 DIAGNOSIS — F3289 Other specified depressive episodes: Secondary | ICD-10-CM | POA: Insufficient documentation

## 2012-02-14 DIAGNOSIS — T448X1A Poisoning by centrally-acting and adrenergic-neuron-blocking agents, accidental (unintentional), initial encounter: Secondary | ICD-10-CM | POA: Insufficient documentation

## 2012-02-14 DIAGNOSIS — IMO0002 Reserved for concepts with insufficient information to code with codable children: Secondary | ICD-10-CM | POA: Insufficient documentation

## 2012-02-14 DIAGNOSIS — G20 Parkinson's disease: Secondary | ICD-10-CM | POA: Insufficient documentation

## 2012-02-14 HISTORY — DX: Major depressive disorder, single episode, unspecified: F32.9

## 2012-02-14 HISTORY — DX: Mental disorder, not otherwise specified: F99

## 2012-02-14 HISTORY — DX: Depression, unspecified: F32.A

## 2012-02-14 NOTE — ED Notes (Signed)
Per EMS - pt from home - pt states he took x30 tabs of 0.4mg  tamsulosin approx 1hr ago, pt admits to intentional w/ intent to harm himself. Pt's medications do not appear to be missing any siginificant amts of capsules.

## 2012-02-15 ENCOUNTER — Encounter (HOSPITAL_COMMUNITY): Payer: Self-pay | Admitting: *Deleted

## 2012-02-15 DIAGNOSIS — F3289 Other specified depressive episodes: Secondary | ICD-10-CM

## 2012-02-15 DIAGNOSIS — F329 Major depressive disorder, single episode, unspecified: Secondary | ICD-10-CM

## 2012-02-15 LAB — COMPREHENSIVE METABOLIC PANEL
AST: 24 U/L (ref 0–37)
CO2: 23 mEq/L (ref 19–32)
Chloride: 99 mEq/L (ref 96–112)
Creatinine, Ser: 0.72 mg/dL (ref 0.50–1.35)
GFR calc Af Amer: >90 mL/min (ref >90)
GFR calc non Af Amer: >90 mL/min (ref >90)
Glucose, Bld: 107 mg/dL — ABNORMAL HIGH (ref 70–99)
Total Bilirubin: 0.5 mg/dL (ref 0.3–1.2)

## 2012-02-15 LAB — CBC
HCT: 45.3 % (ref 39.0–52.0)
Hemoglobin: 16.1 g/dL (ref 13.0–17.0)
MCV: 88.8 fL (ref 78.0–100.0)
RBC: 5.1 MIL/uL (ref 4.22–5.81)
RDW: 12.9 % (ref 11.5–15.5)
WBC: 7.5 10*3/uL (ref 4.0–10.5)

## 2012-02-15 LAB — DIFFERENTIAL
Basophils Absolute: 0 10*3/uL (ref 0.0–0.1)
Eosinophils Relative: 1 % (ref 0–5)
Lymphocytes Relative: 24 % (ref 12–46)
Lymphs Abs: 1.8 10*3/uL (ref 0.7–4.0)
Monocytes Absolute: 0.5 10*3/uL (ref 0.1–1.0)
Monocytes Relative: 7 % (ref 3–12)
Neutro Abs: 5.1 10*3/uL (ref 1.7–7.7)

## 2012-02-15 LAB — ACETAMINOPHEN LEVEL: Acetaminophen (Tylenol), Serum: <15 ug/mL (ref 10–30)

## 2012-02-15 LAB — RAPID URINE DRUG SCREEN, HOSP PERFORMED
Barbiturates: NOT DETECTED
Opiates: NOT DETECTED
Tetrahydrocannabinol: NOT DETECTED

## 2012-02-15 LAB — SALICYLATE LEVEL: Salicylate Lvl: <2 mg/dL — ABNORMAL LOW (ref 2.8–20.0)

## 2012-02-15 MED ORDER — OXYCODONE-ACETAMINOPHEN 5-325 MG PO TABS
2.0000 | ORAL_TABLET | Freq: Once | ORAL | Status: AC
  Administered 2012-02-15: 2 via ORAL
  Filled 2012-02-15: qty 2

## 2012-02-15 MED ORDER — IBUPROFEN 200 MG PO TABS
600.0000 mg | ORAL_TABLET | Freq: Three times a day (TID) | ORAL | Status: DC | PRN
  Administered 2012-02-15 – 2012-02-16 (×3): 600 mg via ORAL
  Filled 2012-02-15 (×3): qty 3

## 2012-02-15 MED ORDER — NICOTINE 21 MG/24HR TD PT24
MEDICATED_PATCH | TRANSDERMAL | Status: AC
  Administered 2012-02-15: 21 mg via TRANSDERMAL
  Filled 2012-02-15: qty 1

## 2012-02-15 MED ORDER — OXYCODONE-ACETAMINOPHEN 5-325 MG PO TABS
2.0000 | ORAL_TABLET | ORAL | Status: DC | PRN
  Administered 2012-02-15 – 2012-02-16 (×9): 2 via ORAL
  Filled 2012-02-15 (×10): qty 2

## 2012-02-15 MED ORDER — ALUM & MAG HYDROXIDE-SIMETH 200-200-20 MG/5ML PO SUSP
30.0000 mL | ORAL | Status: DC | PRN
  Administered 2012-02-15: 30 mL via ORAL
  Filled 2012-02-15: qty 30

## 2012-02-15 MED ORDER — ONDANSETRON HCL 4 MG PO TABS: 4.0000 mg | ORAL_TABLET | Freq: Three times a day (TID) | ORAL | Status: DC | PRN

## 2012-02-15 MED ORDER — NICOTINE 21 MG/24HR TD PT24
21.0000 mg | MEDICATED_PATCH | Freq: Once | TRANSDERMAL | Status: AC
  Administered 2012-02-15 (×2): 21 mg via TRANSDERMAL
  Filled 2012-02-15: qty 1

## 2012-02-15 MED ORDER — BUPROPION HCL ER (XL) 150 MG PO TB24
150.0000 mg | ORAL_TABLET | Freq: Every day | ORAL | Status: DC
  Administered 2012-02-15 – 2012-02-16 (×2): 150 mg via ORAL
  Filled 2012-02-15 (×2): qty 1

## 2012-02-15 MED ORDER — GABAPENTIN 600 MG PO TABS: 300.0000 mg | ORAL_TABLET | Freq: Three times a day (TID) | ORAL | Status: DC

## 2012-02-15 MED ORDER — GABAPENTIN 300 MG PO CAPS
300.0000 mg | ORAL_CAPSULE | Freq: Three times a day (TID) | ORAL | Status: DC
  Administered 2012-02-15 – 2012-02-16 (×3): 300 mg via ORAL
  Filled 2012-02-15 (×5): qty 1

## 2012-02-15 NOTE — ED Notes (Signed)
Pt reports intentional OD on his 0.4mg  tamsulosin tonight approx 30 tabs. Pt states he did this to kill himself bc "he is tired of living with the pain." Pt w/ hx of arthritis and necrotizing faucitis. Pt admits to being addicted to has pain medications and abuses ETOH, pt has been turned away from x3 pain clinics d/t alcohol abuse. Pt appears agitated on assessment and requesting pain medications multiple times. Explained to pt plan of care - pt verbalized understanding and provided w/ sandwich tray and drink. Sitter at bedside.

## 2012-02-15 NOTE — ED Notes (Signed)
Bed:WA28<BR> Expected date:<BR> Expected time:<BR> Means of arrival:<BR> Comments:<BR> hold

## 2012-02-15 NOTE — ED Notes (Signed)
Pt. Still has suicidal ideation.  Doesn't want to live because his pain level is so great with the arthritis, and back problems that he has.  He wants to live in a facility, but he says they see him as too functional.  He admits to being an alcoholic.

## 2012-02-15 NOTE — ED Notes (Signed)
Woke up and complaint of pain 10/10. Stated SI with plan he does not want to disclose at this time. Sitter at bedside. Continues monitoring.

## 2012-02-15 NOTE — ED Provider Notes (Signed)
Results for orders placed during the hospital encounter of 02/14/12  CBC      Component Value Range   WBC 7.5  4.0 - 10.5 (K/uL)   RBC 5.10  4.22 - 5.81 (MIL/uL)   Hemoglobin 16.1  13.0 - 17.0 (g/dL)   HCT 96.2  95.2 - 84.1 (%)   MCV 88.8  78.0 - 100.0 (fL)   MCH 31.6  26.0 - 34.0 (pg)   MCHC 35.5  30.0 - 36.0 (g/dL)   RDW 32.4  40.1 - 02.7 (%)   Platelets 145 (*) 150 - 400 (K/uL)  DIFFERENTIAL      Component Value Range   Neutrophils Relative 68  43 - 77 (%)   Neutro Abs 5.1  1.7 - 7.7 (K/uL)   Lymphocytes Relative 24  12 - 46 (%)   Lymphs Abs 1.8  0.7 - 4.0 (K/uL)   Monocytes Relative 7  3 - 12 (%)   Monocytes Absolute 0.5  0.1 - 1.0 (K/uL)   Eosinophils Relative 1  0 - 5 (%)   Eosinophils Absolute 0.1  0.0 - 0.7 (K/uL)   Basophils Relative 0  0 - 1 (%)   Basophils Absolute 0.0  0.0 - 0.1 (K/uL)  COMPREHENSIVE METABOLIC PANEL      Component Value Range   Sodium 136  135 - 145 (mEq/L)   Potassium 3.6  3.5 - 5.1 (mEq/L)   Chloride 99  96 - 112 (mEq/L)   CO2 23  19 - 32 (mEq/L)   Glucose, Bld 107 (*) 70 - 99 (mg/dL)   BUN 7  6 - 23 (mg/dL)   Creatinine, Ser 2.53  0.50 - 1.35 (mg/dL)   Calcium 8.8  8.4 - 66.4 (mg/dL)   Total Protein 7.0  6.0 - 8.3 (g/dL)   Albumin 3.4 (*) 3.5 - 5.2 (g/dL)   AST 24  0 - 37 (U/L)   ALT 12  0 - 53 (U/L)   Alkaline Phosphatase 83  39 - 117 (U/L)   Total Bilirubin 0.5  0.3 - 1.2 (mg/dL)   GFR calc non Af Amer >90  >90 (mL/min)   GFR calc Af Amer >90  >90 (mL/min)  ETHANOL      Component Value Range   Alcohol, Ethyl (B) <11  0 - 11 (mg/dL)  URINE RAPID DRUG SCREEN (HOSP PERFORMED)      Component Value Range   Opiates NONE DETECTED  NONE DETECTED    Cocaine NONE DETECTED  NONE DETECTED    Benzodiazepines NONE DETECTED  NONE DETECTED    Amphetamines NONE DETECTED  NONE DETECTED    Tetrahydrocannabinol NONE DETECTED  NONE DETECTED    Barbiturates NONE DETECTED  NONE DETECTED   ACETAMINOPHEN LEVEL      Component Value Range   Acetaminophen  (Tylenol), Serum <15.0  10 - 30 (ug/mL)  SALICYLATE LEVEL      Component Value Range   Salicylate Lvl <2.0 (*) 2.8 - 20.0 (mg/dL)  ACETAMINOPHEN LEVEL      Component Value Range   Acetaminophen (Tylenol), Serum <15.0  10 - 30 (ug/mL)    Screening labs, and serial blood pressure measurements, do not indicate a serious overdose. He has chronic whole-body pain, being treated with oral narcotic medication.  He has several recent visits here. On 02/11/12, he presented with suicidal ideation, he had a tele-psychiatric consultation that cleared him for discharge. Presentation, last night with similar c/o.  My evaluation today: Patient is alert and anxious. He is requesting  to be put back on his opiate medication plan (127 mg/day). Here. He has been prescribed, Percocet, 2 every 8 hours when necessary for pain. The patient states that he does not see a pain doctor currently. He has seen 3 different pain, doctors, but does not get ongoing care from them. His PCP gives him his narcotic.  The patient states that he has been living independently for 4 months, but is not doing well with that. His social worker is trying to place him back in an assisted living facility, but so far they have not found a place to take him. This is related to his past.  On exam today. He is alert, anxious, but fairly cooperative. He has scattered superficial skin ulcerations of the right buttock and posterior leg, about 5 in number. None are fluctuant, swollen, or draining. The cause of the lesions is not clear; the lesions appear amenable to simple wound care with daily cleansing followed by topical antibiotic. Expectant management with followup in 1 week is recommended for these lesions.   Current plan is to have the patient evaluated by the in-house psychiatrist this afternoon.     Flint Melter, MD 02/15/12 [phone]

## 2012-02-15 NOTE — ED Notes (Signed)
Patient received telephone call. Patient whining  while on telephone, as soon he hangs up phone patient immediately stops whining and hands me the phone and says thank you.

## 2012-02-15 NOTE — ED Notes (Signed)
1 belongs and 1 bag of meds placed in locker #28.

## 2012-02-15 NOTE — ED Provider Notes (Signed)
History     CSN: [id number]  Arrival date & time 02/14/12  2348   First MD Initiated Contact with Patient 02/15/12 0049      Chief Complaint  Patient presents with  . Drug Overdose    (Consider location/radiation/quality/duration/timing/severity/associated sxs/prior treatment) HPI Pt with history of chronic pain and depression reports he has been out of his pain medications for several days and because of this has become suicidal. States he took 30x 0.4mg  Flomax pills at 2300hrs. Denies any other coingestions tonight but admits to marijuana use over the weekend. Denies any acute somatic complaints. Has severe aching chronic pain in multiple areas including legs.   Past Medical History  Diagnosis Date  . DDD (degenerative disc disease)   . Necrotizing fasciitis   . Degenerative disc disease   . Hypertension   . Degenerative disc disease   . Hepatitis C   . Chronic pain   . DDD (degenerative disc disease)   . Parkinson disease     Past Surgical History  Procedure Date  . Hip surgery   . Appendectomy   . Ankle surgery     History reviewed. No pertinent family history.  History  Substance Use Topics  . Smoking status: Former Smoker -- 1.0 packs/day for 36 years    Quit date: 07/27/2011  . Smokeless tobacco: Former Neurosurgeon    Types: Chew  . Alcohol Use: Yes      Review of Systems All other systems reviewed and are negative except as noted in HPI.   Allergies  No known allergies  Home Medications   Current Outpatient Rx  Name Route Sig Dispense Refill  . AMLODIPINE BESYLATE 5 MG PO TABS Oral Take 1 tablet (5 mg total) by mouth daily. 31 tablet 11  . LEVOTHYROXINE SODIUM 137 MCG PO TABS Oral Take 1 tablet (137 mcg total) by mouth daily. 31 tablet 11  . METOPROLOL TARTRATE 25 MG PO TABS Oral Take 1 tablet (25 mg total) by mouth 2 (two) times daily. 62 tablet 11  . OXYCODONE HCL ER 30 MG PO TB12 Oral Take 1 tablet (30 mg total) by mouth 3 (three) times daily. 90  tablet 0  . OXYCODONE-ACETAMINOPHEN 7.5-325 MG PO TABS Oral Take 1-2 tablets by mouth every 6 (six) hours as needed for pain. Patient may take 2 tab as needed at bedtime. 150 tablet 0  . TAMSULOSIN HCL 0.4 MG PO CAPS Oral Take 2 capsules (0.8 mg total) by mouth at bedtime. 62 capsule 11    BP 145/88  Pulse 81  Temp(Src) 99.8 F (37.7 C) (Oral)  Resp 16  Ht 6\' 4"  (1.93 m)  Wt 220 lb (99.791 kg)  BMI 26.78 kg/m2  SpO2 97%  Physical Exam  Nursing note and vitals reviewed. Constitutional: He is oriented to person, place, and time. He appears well-developed and well-nourished.  HENT:  Head: Normocephalic and atraumatic.  Eyes: EOM are normal. Pupils are equal, round, and reactive to light.  Neck: Normal range of motion. Neck supple.  Cardiovascular: Normal rate, normal heart sounds and intact distal pulses.   Pulmonary/Chest: Effort normal and breath sounds normal.  Abdominal: Bowel sounds are normal. He exhibits no distension. There is no tenderness.  Musculoskeletal: Normal range of motion. He exhibits no edema and no tenderness.  Neurological: He is alert and oriented to person, place, and time. He has normal strength. No cranial nerve deficit or sensory deficit.  Skin: Skin is warm and dry. No rash noted.  Psychiatric:  He has a normal mood and affect. His behavior is normal.    ED Course  Procedures (including critical care time)  Labs Reviewed  CBC - Abnormal; Notable for the following:    Platelets 145 (*)    All other components within normal limits  COMPREHENSIVE METABOLIC PANEL - Abnormal; Notable for the following:    Glucose, Bld 107 (*)    Albumin 3.4 (*)    All other components within normal limits  SALICYLATE LEVEL - Abnormal; Notable for the following:    Salicylate Lvl <2.0 (*)    All other components within normal limits  DIFFERENTIAL  ETHANOL  URINE RAPID DRUG SCREEN (HOSP PERFORMED)  ACETAMINOPHEN LEVEL  ACETAMINOPHEN LEVEL   No results found.   No  diagnosis found.    MDM   Date: 02/15/2012  Rate: 82  Rhythm: normal sinus rhythm  QRS Axis: right  Intervals: normal  ST/T Wave abnormalities: normal  Conduction Disutrbances:none  Narrative Interpretation:   Old EKG Reviewed: unchanged  Discussed with Poison Control who recommended 4hr monitoring for hypotension. Also recommended 4 hr APAP level. Pt requesting pain medication was given oral Percocet.    3:50 AM Pt continues to complain of severe chronic pain. Advised that he was here to be evaluated for his psychiatric complaints and not for chronic pain. Advised that he could have additional pain medications after his workup was completed and he had been evaluated by ACT team. Will transfer to [facility].     Daimian Sudberry B. Bernette Mayers, MD 02/15/12 1929

## 2012-02-15 NOTE — ED Notes (Signed)
EDP at bedside talking to pt.  Sitter also remains at b/s.
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
## 2012-02-15 NOTE — BHH Counselor (Signed)
Patient declined by psychiatrist Lum Keas, MD due to medical acuity. [facility], The staff has concerns related to patients Necrotizing fasciitis (aka. flesh eating disease). Pt with history of picking at his skin causing sores. Patient has current sores related to picking at this skin. He would not be appropriate in a environment with other patients. Also, patient has noted chronic pain management issues. Their is also concern that patient has received pain management at 3 pain clinics in the recent past. [facility] staff/psychiatrist do not feel they will be able to management patients pain management issues as he receives inpatient stabilization.   Dr. Effie Shy (EDP) made aware of the above issues related to patients disposition. Writer also informed patients nurse Elife Noell of the above information. Dr. Effie Shy requesting Dr. Carmelina Dane to evaluate patient for recommendations and appropriate level of care.   Disposition pending psych evaluation.

## 2012-02-15 NOTE — Consult Note (Signed)
Reason for Consult: Depression, suicidal ideation, chronic pain syndromes Referring Physician: Dr. Ronette Deter Bolander is an 56 y.o. male.  HPI: This is a 56 years old, single, Caucasian male, brought in by emergency medical services for complaining depression, suicidal ideation, chronic pain. Patient reported he has been suffering with the severe uncontrollable crying since the he suffered with the necrotizing fascitis in 2009. He has served degenerative joint disease is in his back and arthritis and is both knees. Reportedly, he Was placed in several [facility] facility including [facility], [facility] and [facility] about 6-8 months each and then he was kicked out of the facility for drug seeking behavior.reportedly he was living in an apartment for the last 4-1/2 months and making a wrong choices of drinking alcohol and using crack cocaine and running out of his the pain medications ahead. Patient was seen 3 different pain clinics and then discharged because of demand beyond maximum doses of medication and not following the directions. Reportedly [facility] and than [facility] was the [facility] because he does not like weaning off his pain medication. Patient reported he has a significant pain and he cannot bear it and he won't hurt himself and he took his Flomax intentionally with hurting himself before arrival to the emergency department. Patient has been working with the social worker [name], who is trying to place him out of home, assisted [place].  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 Patient reportedly has a 50 bottles in his life. He enjoyed sex, love, and rock and role throughout his life.  Patient was dysphoric, tearful, and claiming suicidal  ideation, and had a suicidal attempt. Patient has no previous history of mental health diagnosis. Patient has no reported hallucinations, paranoia or delusions     Past Medical History  Diagnosis Date  . DDD (degenerative disc disease)   . Necrotizing fasciitis   . Degenerative disc disease   . Hypertension   . Degenerative disc disease   . Hepatitis C   . Chronic pain   . DDD (degenerative disc disease)   . Parkinson disease   . Mental disorder   . Depression     Past Surgical History  Procedure Date  . Hip surgery   . Appendectomy   . Ankle surgery     History reviewed. No pertinent family history.  Social History:  reports that he quit smoking about 6 months ago. He has quit using smokeless tobacco. His smokeless tobacco use included Chew. He reports that he drinks alcohol. He reports that he uses illicit drugs (Marijuana and Cocaine).  Allergies:  Allergies  Allergen Reactions  . No Known Allergies     Medications: I have reviewed the patient's current medications.  Results for orders placed during the hospital encounter of 02/14/12 (from the past 48 hour(s))  URINE RAPID DRUG SCREEN (HOSP PERFORMED)     Status: Normal   Collection Time   02/15/12  1:00 AM      Component Value Range Comment   Opiates NONE DETECTED  NONE DETECTED     Cocaine NONE DETECTED  NONE DETECTED     Benzodiazepines NONE DETECTED  NONE DETECTED     Amphetamines NONE DETECTED  NONE DETECTED     Tetrahydrocannabinol  NONE DETECTED  NONE DETECTED     Barbiturates NONE DETECTED  NONE DETECTED    CBC     Status: Abnormal   Collection Time   02/15/12  1:25 AM      Component Value Range Comment   WBC 7.5  4.0 - 10.5 (K/uL)    RBC 5.10  4.22 - 5.81 (MIL/uL)    Hemoglobin 16.1  13.0 - 17.0 (g/dL)    HCT 41.3  24.4 - 01.0 (%)    MCV 88.8  78.0 - 100.0 (fL)    MCH 31.6  26.0 - 34.0 (pg)    MCHC 35.5  30.0 - 36.0 (g/dL)    RDW 27.2  53.6 - 64.4 (%)    Platelets 145 (*) 150 - 400 (K/uL)    DIFFERENTIAL     Status: Normal   Collection Time   02/15/12  1:25 AM      Component Value Range Comment   Neutrophils Relative 68  43 - 77 (%)    Neutro Abs 5.1  1.7 - 7.7 (K/uL)    Lymphocytes Relative 24  12 - 46 (%)    Lymphs Abs 1.8  0.7 - 4.0 (K/uL)    Monocytes Relative 7  3 - 12 (%)    Monocytes Absolute 0.5  0.1 - 1.0 (K/uL)    Eosinophils Relative 1  0 - 5 (%)    Eosinophils Absolute 0.1  0.0 - 0.7 (K/uL)    Basophils Relative 0  0 - 1 (%)    Basophils Absolute 0.0  0.0 - 0.1 (K/uL)   COMPREHENSIVE METABOLIC PANEL     Status: Abnormal   Collection Time   02/15/12  1:25 AM      Component Value Range Comment   Sodium 136  135 - 145 (mEq/L)    Potassium 3.6  3.5 - 5.1 (mEq/L)    Chloride 99  96 - 112 (mEq/L)    CO2 23  19 - 32 (mEq/L)    Glucose, Bld 107 (*) 70 - 99 (mg/dL)    BUN 7  6 - 23 (mg/dL)    Creatinine, Ser 0.34  0.50 - 1.35 (mg/dL)    Calcium 8.8  8.4 - 10.5 (mg/dL)    Total Protein 7.0  6.0 - 8.3 (g/dL)    Albumin 3.4 (*) 3.5 - 5.2 (g/dL)    AST 24  0 - 37 (U/L)    ALT 12  0 - 53 (U/L)    Alkaline Phosphatase 83  39 - 117 (U/L)    Total Bilirubin 0.5  0.3 - 1.2 (mg/dL)    GFR calc non Af Amer >90  >90 (mL/min)    GFR calc Af Amer >90  >90 (mL/min)   ETHANOL     Status: Normal   Collection Time   02/15/12  1:25 AM      Component Value Range Comment   Alcohol, Ethyl (B) <11  0 - 11 (mg/dL)   ACETAMINOPHEN LEVEL     Status: Normal   Collection Time   02/15/12  1:25 AM      Component Value Range Comment   Acetaminophen (Tylenol), Serum <15.0  10 - 30 (ug/mL)   SALICYLATE LEVEL     Status: Abnormal   Collection Time   02/15/12  1:25 AM      Component Value Range Comment   Salicylate Lvl <2.0 (*) 2.8 - 20.0 (mg/dL)   ACETAMINOPHEN LEVEL     Status: Normal   Collection Time  02/15/12  3:00 AM      Component Value Range Comment   Acetaminophen (Tylenol), Serum <15.0  10 - 30 (ug/mL)     No results found.  No psychosis and Positive for anxiety, bad  mood, borderline personality disorder, depression, excessive alcohol consumption, illegal drug usage and Alcohol and crack cocaine abuse Blood pressure 157/98, pulse 64, temperature 98.4 F (36.9 C), temperature source Oral, resp. rate 18, height 6\' 4"  (1.93 m), weight 220 lb (99.791 kg), SpO2 97.00%.   Assessment/Plan: Depressive disorder, secondary to chronic pain. Polysubstance dependence Personality disorder, not otherwise specified. Multiple medical problems.  Recommended acute psychiatric hospitalization for safety and stabilization. May start Wellbutrin XL 450 mg daily, and Neurontin 300 mg 3 times a day.     Jericho Cieslik,JANARDHAHA R. 02/15/2012, 4:46 PM

## 2012-02-15 NOTE — ED Notes (Addendum)
CSW was consulted by the unit psychiatrist to answer questions for the pt on how to purse ALF placement. Pt states that he has been working with CSW Kotlik with outpatient services regarding ALF placement. Pt states before he was unwilling to search outside of [place], however he is aware that the search may need to be expanded to find a facility that is able to meet his needs. Pt verbalized understanding and was agreeable. Pt verbalized understanding that he would not necessarily remain in the ED until an ALF was found and agreed that he was able to return to his apartment at discharge once medically/psychiatrically cleared. Pt states that ultimately, he would like to return to an ALF but understands that it is a process and will not happen immediately. Pt is willing to pursue ALF placement on an outpatient basis.  Pt mentioned that he has a friend that had been to [facility] in the past and he felt that he may like to go there at some point. ED CSW will contact CSW [name] to express pt's wish to be placed in an ALF in the future. CSW reviewed this plan with the CSW Director, [facility], who is in agreement with this plan.    Consult back to CSW as needed.

## 2012-02-15 NOTE — ED Notes (Signed)
Pt. Reports nicotine patch fell off, reapplied new patch.

## 2012-02-16 MED ORDER — BUPROPION HCL ER (XL) 300 MG PO TB24: 300.0000 mg | ORAL_TABLET | Freq: Every day | ORAL | Status: DC

## 2012-02-16 MED ORDER — NICOTINE 21 MG/24HR TD PT24
MEDICATED_PATCH | TRANSDERMAL | Status: AC
  Administered 2012-02-16: 21 mg
  Filled 2012-02-16: qty 1

## 2012-02-16 MED ORDER — OXYCODONE-ACETAMINOPHEN 7.5-325 MG PO TABS: 1.0000 | ORAL_TABLET | ORAL | Status: DC | PRN

## 2012-02-16 MED ORDER — GABAPENTIN 400 MG PO CAPS
400.0000 mg | ORAL_CAPSULE | Freq: Three times a day (TID) | ORAL | Status: DC
  Administered 2012-02-16: 400 mg via ORAL
  Filled 2012-02-16 (×2): qty 1

## 2012-02-16 NOTE — ED Notes (Signed)
[facility] called  

## 2012-02-16 NOTE — BHH Counselor (Signed)
Pt previously declined at [facility]. No beds at [facility] or [facility].  Faxed info to [facility] for review.

## 2012-02-16 NOTE — ED Notes (Signed)
CSW met with pt to discuss barriers to discharge. Pt states that he is "scared to discharge because I do not have my pain medications at home." Pt states that he sees his regular MD on 02/25/12 and can not get pain medications until then. Pt states that "if he does not have his pain medication the pain is too much to handle." Pt states that if he has pain medication when discharged, he will be able to manage until he can go to an ALF.   CSW met with EDP Patria Mane who agreed to talk with the pt regarding pain medications.   At this time, pt is agreeable to discharge since he was provided with pain medications and is willing to sign a no harm contract. Pt was discharged with a prescription for Percocet. Pt was provided information for multiple pain management clinics in the area as well as resources to mobile crisis, mental health and local psychiatrists and therapists. Pt is able to get transportation through [facility]. No further needs identified by the pt at this time.

## 2012-02-16 NOTE — Discharge Instructions (Signed)
Chronic Back Pain When back pain lasts longer than 3 months, it is called chronic back pain.This pain can be frustrating, but the cause of the pain is rarely dangerous.People with chronic back pain often go through certain periods that are more intense (flare-ups). CAUSES Chronic back pain can be caused by wear and tear (degeneration) on different structures in your back. These structures may include bones, ligaments, or discs. This degeneration may result in more pressure being placed on the nerves that travel to your legs and feet. This can lead to pain traveling from the low back down the back of the legs. When pain lasts longer than 3 months, it is not unusual for people to experience anxiety or depression. Anxiety and depression can also contribute to low back pain. TREATMENT  Establish a regular exercise plan. This is critical to improving your functional level.   Have a self-management plan for when you flare-up. Flare-ups rarely require a medical visit. Regular exercise will help reduce the intensity and frequency of your flare-ups.   Manage how you feel about your back pain and the rest of your life. Anxiety, depression, and feeling that you cannot alter your back pain have been shown to make back pain more intense and debilitating.   Medicines should never be your only treatment. They should be used along with other treatments to help you return to a more active lifestyle.   Procedures such as injections or surgery may be helpful but are rarely necessary. You may be able to get the same results with physical therapy or chiropractic care.  HOME CARE INSTRUCTIONS  Avoid bending, heavy lifting, prolonged sitting, and activities which make the problem worse.   Continue normal activity as much as possible.   Take brief periods of rest throughout the day to reduce your pain during flare-ups.   Follow your back exercise rehabilitation program. This can help reduce symptoms and prevent  more pain.   Only take over-the-counter or prescription medicines as directed by your caregiver. Muscle relaxants are sometimes prescribed. Narcotic pain medicine is discouraged for long-term pain, since addiction is a possible outcome.   If you smoke, quit.   Eat healthy foods and maintain a recommended body weight.  SEEK IMMEDIATE MEDICAL CARE IF:   You have weakness or numbness in one of your legs or feet.   You have trouble controlling your bladder or bowels.   You develop nausea, vomiting, abdominal pain, shortness of breath, or fainting.  Document Released: 09/30/2004 Document Revised: 08/12/2011 Document Reviewed: 08/07/2011 [employer] Patient Information 2012 [employer]. 

## 2012-02-16 NOTE — Consult Note (Signed)
Reason for Consult: depression, substance abuse and chronic pain Referring Physician: Dr. Genelle Gather [name] is an 56 y.o. male.  HPI: Patient was seen and chart reviewed. Patient stated that he has made phone calls to a friend who suggested a place for him which he wants to try. He stated that he does not want to be alone and worried about making wrong choices of drinking and doing drugs and blames his pain medication was not enough. He has drug seeking behaviors too. He was engaged most of the time discussing his chronic pain and how they are not working after three years being on them.He was depressed and tearful at some point. He stated that he is feeling better with medication Wellbutrin and agree to increase it. He was aware of mixing the medication with alcohol can decrease threshold of seizures. He has no history of seizure and stated that he does not like drinking. He was positive of alcohol and cocaine on February 10, 2012, left the hospital with pain medication and came back on 02/14/12 with similar complaints but no drug of abuse or alcohol in his system. Patient has been receiving secondary gains and demanding for pain medications. He has not endorsed suicidal ideations, intension or plans at this time. He has no HI and psychosis.  Past Medical History  Diagnosis Date  . DDD (degenerative disc disease)   . Necrotizing fasciitis   . Degenerative disc disease   . Hypertension   . Degenerative disc disease   . Hepatitis C   . Chronic pain   . DDD (degenerative disc disease)   . Parkinson disease   . Mental disorder   . Depression     Past Surgical History  Procedure Date  . Hip surgery   . Appendectomy   . Ankle surgery     History reviewed. No pertinent family history.  Social History:  reports that he quit smoking about 6 months ago. He has quit using smokeless tobacco. His smokeless tobacco use included Chew. He reports that he drinks alcohol. He reports that he uses illicit  drugs (Marijuana and Cocaine).  Allergies:  Allergies  Allergen Reactions  . No Known Allergies     Medications: I have reviewed the patient's current medications.  Results for orders placed during the hospital encounter of 02/14/12 (from the past 48 hour(s))  URINE RAPID DRUG SCREEN (HOSP PERFORMED)     Status: Normal   Collection Time   02/15/12  1:00 AM      Component Value Range Comment   Opiates NONE DETECTED  NONE DETECTED    Cocaine NONE DETECTED  NONE DETECTED    Benzodiazepines NONE DETECTED  NONE DETECTED    Amphetamines NONE DETECTED  NONE DETECTED    Tetrahydrocannabinol NONE DETECTED  NONE DETECTED    Barbiturates NONE DETECTED  NONE DETECTED   CBC     Status: Abnormal   Collection Time   02/15/12  1:25 AM      Component Value Range Comment   WBC 7.5  4.0 - 10.5 K/uL    RBC 5.10  4.22 - 5.81 MIL/uL    Hemoglobin 16.1  13.0 - 17.0 g/dL    HCT 16.1  09.6 - 04.5 %    MCV 88.8  78.0 - 100.0 fL    MCH 31.6  26.0 - 34.0 pg    MCHC 35.5  30.0 - 36.0 g/dL    RDW 40.9  81.1 - 91.4 %    Platelets 145 (*)  150 - 400 K/uL   DIFFERENTIAL     Status: Normal   Collection Time   02/15/12  1:25 AM      Component Value Range Comment   Neutrophils Relative 68  43 - 77 %    Neutro Abs 5.1  1.7 - 7.7 K/uL    Lymphocytes Relative 24  12 - 46 %    Lymphs Abs 1.8  0.7 - 4.0 K/uL    Monocytes Relative 7  3 - 12 %    Monocytes Absolute 0.5  0.1 - 1.0 K/uL    Eosinophils Relative 1  0 - 5 %    Eosinophils Absolute 0.1  0.0 - 0.7 K/uL    Basophils Relative 0  0 - 1 %    Basophils Absolute 0.0  0.0 - 0.1 K/uL   COMPREHENSIVE METABOLIC PANEL     Status: Abnormal   Collection Time   02/15/12  1:25 AM      Component Value Range Comment   Sodium 136  135 - 145 mEq/L    Potassium 3.6  3.5 - 5.1 mEq/L    Chloride 99  96 - 112 mEq/L    CO2 23  19 - 32 mEq/L    Glucose, Bld 107 (*) 70 - 99 mg/dL    BUN 7  6 - 23 mg/dL    Creatinine, Ser 4.09  0.50 - 1.35 mg/dL    Calcium 8.8  8.4 - 81.1  mg/dL    Total Protein 7.0  6.0 - 8.3 g/dL    Albumin 3.4 (*) 3.5 - 5.2 g/dL    AST 24  0 - 37 U/L    ALT 12  0 - 53 U/L    Alkaline Phosphatase 83  39 - 117 U/L    Total Bilirubin 0.5  0.3 - 1.2 mg/dL    GFR calc non Af Amer >90  >90 mL/min    GFR calc Af Amer >90  >90 mL/min   ETHANOL     Status: Normal   Collection Time   02/15/12  1:25 AM      Component Value Range Comment   Alcohol, Ethyl (B) <11  0 - 11 mg/dL   ACETAMINOPHEN LEVEL     Status: Normal   Collection Time   02/15/12  1:25 AM      Component Value Range Comment   Acetaminophen (Tylenol), Serum <15.0  10 - 30 ug/mL   SALICYLATE LEVEL     Status: Abnormal   Collection Time   02/15/12  1:25 AM      Component Value Range Comment   Salicylate Lvl <2.0 (*) 2.8 - 20.0 mg/dL   ACETAMINOPHEN LEVEL     Status: Normal   Collection Time   02/15/12  3:00 AM      Component Value Range Comment   Acetaminophen (Tylenol), Serum <15.0  10 - 30 ug/mL     No results found.  Positive for bad mood, behavior problems, borderline personality disorder, depression, excessive alcohol consumption and illegal drug usage Blood pressure 158/95, pulse 56, temperature 98.4 F (36.9 C), temperature source Oral, resp. rate 20, height 6\' 4"  (1.93 m), weight 220 lb (99.791 kg), SpO2 96.00%.   Assessment/Plan: Depression secondary to chronic pain Multiple chronic medical problems   Recommended titrated dose of Wellbutrin XL 300 mg daily and increase Neurontin 400 mg PO TID  Patient has requested Assisted living facility where he can be cared for his physical condition.  He was learned  about [name] and willing to give a trial.  His Child psychotherapist [name] will be contacted for facilitating ALF  [name],Jeffrey R. 02/16/2012, 2:34 PM

## 2012-02-16 NOTE — ED Notes (Signed)
Observed patient using hand hygiene after applying barrier cream to his hips. Patient has asked a few times to have some Purell hand gel on towel, then uses it to scrub hands well. Encouraged him to wash or gel hands as needed.

## 2012-02-16 NOTE — ED Notes (Signed)
Patient requested pain medication. Patient informed that I would get medication as soon as finished up with another patient. Patient begin cursing and swearing. Patient was told that we would not tolerate that kind of language. He was was told he would get it as soon as possible.

## 2012-02-16 NOTE — ED Provider Notes (Addendum)
8:47 AM Filed Vitals:   02/16/12 0601  BP: 140/96  Pulse: 60  Temp:   Resp: 18   Awaiting placement. Calm and cooperative at this time  Lyanne Co, MD 02/16/12 0849  4:19 PM I spoke with patient at length and the sounds be more chronic pain exacerbation.  His chronic low back pain for which she is treated with high-dose narcotics by the family [facility].  He runs out of his pain medications every month and he became upset the other evening because of how severe his pain was.  At this time he agrees he is not homicidal nor suicidal and that his issues are really related to pain.  He will be prescribed Percocet for discharge home and close followup with his primary care physician who is also treating his pain on a chronic basis.  Based on the recommendations of the psychiatrist 0 Maxon changes in his other medications  Lyanne Co, MD 02/16/12 1622

## 2012-02-17 ENCOUNTER — Telehealth: Payer: Self-pay | Admitting: Clinical

## 2012-02-17 NOTE — Telephone Encounter (Signed)
Clinical Social Worker attempted to reach pt to let him know that he needs to call Medicaid Transportation [phone] and speak with Loran Senters to setup his transportation to his appointments. Pt phone is off and second phone listed is not working.  Theresia Bough, MSW, Theresia Majors [phone]

## 2012-02-20 ENCOUNTER — Emergency Department (HOSPITAL_COMMUNITY)
Admission: EM | Admit: 2012-02-20 | Discharge: 2012-02-20 | Disposition: A | Discharge status: Home / Self Care | Payer: Medicaid Other | Attending: Emergency Medicine | Admitting: Emergency Medicine

## 2012-02-20 ENCOUNTER — Encounter (HOSPITAL_COMMUNITY): Payer: Self-pay | Admitting: *Deleted

## 2012-02-20 ENCOUNTER — Telehealth: Payer: Self-pay | Admitting: Family Medicine

## 2012-02-20 DIAGNOSIS — B1920 Unspecified viral hepatitis C without hepatic coma: Secondary | ICD-10-CM | POA: Insufficient documentation

## 2012-02-20 DIAGNOSIS — IMO0002 Reserved for concepts with insufficient information to code with codable children: Secondary | ICD-10-CM | POA: Insufficient documentation

## 2012-02-20 DIAGNOSIS — Z87891 Personal history of nicotine dependence: Secondary | ICD-10-CM | POA: Insufficient documentation

## 2012-02-20 DIAGNOSIS — G8929 Other chronic pain: Secondary | ICD-10-CM

## 2012-02-20 DIAGNOSIS — I10 Essential (primary) hypertension: Secondary | ICD-10-CM | POA: Insufficient documentation

## 2012-02-20 DIAGNOSIS — G20A1 Parkinson's disease without dyskinesia, without mention of fluctuations: Secondary | ICD-10-CM | POA: Insufficient documentation

## 2012-02-20 DIAGNOSIS — G20 Parkinson's disease: Secondary | ICD-10-CM | POA: Insufficient documentation

## 2012-02-20 MED ORDER — HYDROMORPHONE HCL PF 2 MG/ML IJ SOLN
2.0000 mg | Freq: Once | INTRAMUSCULAR | Status: AC
  Administered 2012-02-20: 2 mg via INTRAMUSCULAR
  Filled 2012-02-20: qty 1

## 2012-02-20 MED ORDER — OXYCODONE-ACETAMINOPHEN 7.5-325 MG PO TABS: 1.0000 | ORAL_TABLET | ORAL | Status: AC | PRN

## 2012-02-20 NOTE — ED Notes (Addendum)
3 days ago at [facility] for taking a lot of his meds to hurt himself.  Tonight when EMS arrived he was attempting to take more meds and EMS removed the meds from his reach

## 2012-02-20 NOTE — ED Provider Notes (Signed)
History     CSN: [id number]  Arrival date & time 02/20/12  0034   First MD Initiated Contact with Patient 02/20/12 0047      Chief Complaint  Patient presents with  . Generalized Body Aches    (Consider location/radiation/quality/duration/timing/severity/associated sxs/prior treatment) HPI 56 year old male presents to emergency department complaining of diffuse arthralgias and body aches. Patient reports history of chronic pain. Patient reports he was discharged 4 days ago from [place] [facility], reports he was "tract into it". Patient was hospitalized at [facility] in the emergency department after taking an overdose. Patient reports he tried to kill himself because he is tired of living with the pain. Patient is followed by family practice, has his pain management through them. Patient also given a prescription for Percocet by the emergency department 4 days ago, he reports those are all gone. He feels that someone may have been taking some from him here he he does not have another appointment with his doctor until Friday. No change in the nature of his pain. Patient reports the pain makes it difficult for him to get around. Patient uses a wheelchair at home to get from couch to toilet. Patient with loud moaning and thrashing on the bed, but quiets when talking to me about his history and current presentation.  Past Medical History  Diagnosis Date  . DDD (degenerative disc disease)   . Necrotizing fasciitis   . Degenerative disc disease   . Hypertension   . Degenerative disc disease   . Hepatitis C   . Chronic pain   . DDD (degenerative disc disease)   . Parkinson disease   . Mental disorder   . Depression     Past Surgical History  Procedure Date  . Hip surgery   . Appendectomy   . Ankle surgery     History reviewed. No pertinent family history.  History  Substance Use Topics  . Smoking status: Former Smoker -- 1.0 packs/day for 36 years    Quit date: 07/27/2011  .  Smokeless tobacco: Former Neurosurgeon    Types: Chew  . Alcohol Use: Yes      Review of Systems  Unable to perform ROS: Other   patient uncooperative   Allergies  No known allergies  Home Medications   Current Outpatient Rx  Name Route Sig Dispense Refill  . AMLODIPINE BESYLATE 5 MG PO TABS Oral Take 1 tablet (5 mg total) by mouth daily. 31 tablet 11  . LEVOTHYROXINE SODIUM 137 MCG PO TABS Oral Take 1 tablet (137 mcg total) by mouth daily. 31 tablet 11  . METOPROLOL TARTRATE 25 MG PO TABS Oral Take 1 tablet (25 mg total) by mouth 2 (two) times daily. 62 tablet 11  . OXYCODONE HCL ER 30 MG PO TB12 Oral Take 1 tablet (30 mg total) by mouth 3 (three) times daily. 90 tablet 0  . OXYCODONE-ACETAMINOPHEN 7.5-325 MG PO TABS Oral Take 1-2 tablets by mouth every 6 (six) hours as needed for pain. Patient may take 2 tab as needed at bedtime. 150 tablet 0  . TAMSULOSIN HCL 0.4 MG PO CAPS Oral Take 2 capsules (0.8 mg total) by mouth at bedtime. 62 capsule 11  . OXYCODONE-ACETAMINOPHEN 7.5-325 MG PO TABS Oral Take 1 tablet by mouth every 4 (four) hours as needed for pain. 30 tablet 0    BP 123/73  Pulse 64  Temp 98.1 F (36.7 C) (Oral)  Resp 15  SpO2 97%  Physical Exam  Nursing note and  vitals reviewed. Constitutional: He appears distressed (patient moaning in pain, but calms when talking with me).       Disheveled  HENT:  Head: Normocephalic.       Abrasion above left eyebrow  Eyes: Conjunctivae and EOM are normal. Pupils are equal, round, and reactive to light.  Neck: Normal range of motion. Neck supple.  Cardiovascular: Normal rate, regular rhythm, normal heart sounds and intact distal pulses.  Exam reveals no gallop and no friction rub.   No murmur heard. Pulmonary/Chest: Effort normal and breath sounds normal. No respiratory distress. He has no wheezes. He has no rales. He exhibits no tenderness.  Musculoskeletal:       Patient moving all extremities equally. Any attempt to examine and  his legs causes increased moaning and patient refuses to follow directions. I am able to passively move all extremities. Patient reports pain with palpation of any area of his arms or legs  Neurological: He exhibits normal muscle tone. Coordination normal.  Skin: Skin is warm and dry. No rash noted. No erythema. No pallor.  Psychiatric:       Agent agitated, flat affect, anxious and sad    ED Course  Procedures (including critical care time)  Labs Reviewed - No data to display No results found.   1. Chronic pain       MDM  56 year old male with chronic pain area and had the same practice resident come down and see the patient to help with management of his pain. Patient has had multiple visits to the emergency department for breakthrough pain, and running out of his pain medicines sooner than scheduled. Family Practice will try to get him into clinic early.  He was given dilaudid 2 mg im here, and prescription for percocet, 7.5 mg 30 tabs to bridge until his appointment.        Olivia Mackie, MD 02/20/12 [phone]

## 2012-02-20 NOTE — Telephone Encounter (Signed)
Called by ED to talk with patient about frequent visits to ED.  Has had 8 visits in the past 6 months, mostly for pain.  He has been doubling up on his pain medications and has been running out early.  He states Dr. Domenick Bookbinder knows about this, however previous telephone notes do not indicate this.  I explained to him that it was not appropriate to continue to come into the ED for refills on pain medication.  Furthermore I explained to him that if he continues this his PCP may decide to quit prescribing his pain medication all together, as it is not appropriate to get pain medication elsewhere for his chronic pain.  When he has ran out off his medication in the past he has stated that he is going to kill himself.  He does not express this tonight.  Discussed with EDP, who will give him 30 tablets of percocet and I will route to clinic staff and PCP to see if we can get him in sooner to our clinic for follow up.  Patient likely has increased pain sensitivity due to chronic narcotic use, would likely benefit from weaning from narcotics. ?whether cymbalta would be a good option for him.  May also benefit from lidoderm patch.

## 2012-02-20 NOTE — ED Notes (Signed)
Patient yelling out loud stating that he is having pain al over "my arthritis is acting up".

## 2012-02-20 NOTE — Discharge Instructions (Signed)
You have been given a pain shot and a prescription for pain medicine to last you until your next appointment. You have been seen in the emergency department tonight by family practice, who will try to arrange an earlier appointment than Friday with you. Please keep track of your medication, do not let other people have access to it. If your medication is used early or become lost or stolen, you may be dropped from the family practice clinic.  Chronic Pain Management Managing chronic pain is not easy. The goal is to provide as much pain relief as possible. There are emotional as well as physical problems. Chronic pain may lead to symptoms of depression which magnify those of the pain. Problems may include:  Anxiety.   Sleep disturbances.   Confused thinking.   Feeling cranky.   Fatigue.   Weight gain or loss.  Identify the source of the pain first, if possible. The pain may be masking another problem. Try to find a pain management specialist or clinic. Work with a team to create a treatment plan for you. MEDICATIONS  May include narcotics or opioids. Larger than normal doses may be needed to control your pain.   Drugs for depression may help.   Over-the-counter medicines may help for some conditions. These drugs may be used along with others for better pain relief.   May be injected into sites such as the spine and joints. Injections may have to be repeated if they wear off.  THERAPY MAY INCLUDE:  Working with a physical therapist to keep from getting stiff.   Regular, gentle exercise.   Cognitive or behavioral therapy.   Using complementary or integrative medicine such as:   Acupuncture.   Massage, Reiki, or Rolfing.   Aroma, color, light, or sound therapy.   Group support.  FOR MORE INFORMATION [email or web address]. American Chronic Pain Association [email or web address]. Document Released: 09/30/2004 Document Revised: 08/12/2011 Document Reviewed:  11/09/2007 [facility] Patient Information 2012 [place], [place].  Chronic Pain Chronic pain can be defined as pain that is lasting, off and on, and lasts for 3 to 6 months or longer. Many things cause chronic pain, which can make it difficult to make a discrete diagnosis. There are many treatment options available for chronic pain. However, finding a treatment that works well for you may require trying various approaches until a suitable one is found. CAUSES  In some types of chronic medical conditions, the pain is caused by a normal pain response within the body. A normal pain response helps the body identify illness or injury and prevent further damage from being done. In these cases, the cause of the pain may be identified and treated, even if it may not be cured completely. Examples of chronic conditions which can cause chronic pain include:  Inflammation of the joints (arthritis).   Back pain or neck pain (including bulging or herniated disks).   Migraine headaches.   Cancer.  In some other types of chronic pain syndromes, the pain is caused by an abnormal pain response within the body. An abnormal pain response is present when there is no ongoing cause (or stimulus) for the pain, or when the cause of the pain is arising from the nerves or nervous system itself. Examples of conditions which can cause chronic pain due to an abnormal pain response include:  Fibromyalgia.   Reflex sympathetic dystrophy (RSD).   Neuropathy (when the nerves themselves are damaged, and may cause pain).  DIAGNOSIS  Your caregiver will help diagnose your condition  over time. In many cases, the initial focus will be on excluding conditions that could be causing the pain. Depending on your symptoms, your caregiver may order some tests to diagnose your condition. Some of these tests include:  Blood tests.   Computerized X-ray scans (CT scan).   Computerized magnetic scans (MRI).   X-rays.   Ultrasounds.    Nerve conduction studies.   Consultation with other physicians or specialists.  TREATMENT  There are many treatment options for people suffering from chronic pain. Finding a treatment that works well may take time.   You may be referred to a pain management specialist.   You may be put on medication to help with the pain. Unfortunately, some medications (such as opiate medications) may not be very effective in cases where chronic pain is due to abnormal pain responses. Finding the right medications can take some time.   Adjunctive therapies may be used to provide additional relief and improve a patient's quality of life. These therapies include:   Mindfulness meditation.   Acupuncture.   Biofeedback.   Cognitive-behavioral therapy.   In certain cases, surgical interventions may be attempted.  HOME CARE INSTRUCTIONS   Make sure you understand these instructions prior to discharge.   Ask any questions and share any further concerns you have with your caregiver prior to discharge.   Take all medications as directed by your caregiver.   Keep all follow-up appointments.  SEEK MEDICAL CARE IF:   Your pain gets worse.   You develop a new pain that was not present before.   You cannot tolerate any medications prescribed by your caregiver.   You develop new symptoms since your last visit with your caregiver.  SEEK IMMEDIATE MEDICAL CARE IF:   You develop muscular weakness.   You have decreased sensation or numbness.   You lose control of bowel or bladder function.   Your pain suddenly gets much worse.   You have an oral temperature above 102 F (38.9 C), not controlled by medication.   You develop shaking chills, confusion, chest pain, or shortness of breath.  Document Released: 05/15/2002 Document Revised: 08/12/2011 Document Reviewed: 08/21/2008 [facility] Patient Information 2012 [place], [place].

## 2012-02-21 ENCOUNTER — Telehealth: Payer: Self-pay | Admitting: Clinical

## 2012-02-21 NOTE — Telephone Encounter (Signed)
Hi Huntley Dec, it would be best to keep his appointment with me on 6/21 so I can discuss his narcotic abuse.  It would be nice to get him in earlier, but no other provider will prescribe him narcotics either.

## 2012-02-21 NOTE — Telephone Encounter (Signed)
Ivy, What would you like to do about this situation?

## 2012-02-21 NOTE — Telephone Encounter (Signed)
Clinical Child psychotherapist (CSW) unable to reach pt by phone as his phone is disconnected and the number he provided CSW will not accept calls. CSW is trying to inform pt that he needs to contact Medicaid transportation [phone] asap for his appt on 6/21. If pt does not call himself (agency will not allow CSW to make the appt,) he will not have transportation to his appt. CSW also unable to inform pt that he has had offers for ALF placement in [facility].   Theresia Bough, MSW, Theresia Majors [phone]

## 2012-02-23 ENCOUNTER — Telehealth: Payer: Self-pay | Admitting: Clinical

## 2012-02-23 NOTE — Telephone Encounter (Signed)
Clinical Social Worker (CSW) contacted Medicaid transportation and informed them that pt phone is currently off and therefore unable to schedule Medicaid transportation for his appointment on February 25, 2012 with his pcp. CSW informed that transportation will be at pt home at 12:40pm. They will allow CSW to schedule this appt due to pt telephone issues however it is important that pt be ready. CSW still unable to reach pt to inform.   Theresia Bough, MSW, Theresia Majors [phone]

## 2012-02-25 ENCOUNTER — Ambulatory Visit (INDEPENDENT_AMBULATORY_CARE_PROVIDER_SITE_OTHER): Payer: Medicaid Other | Admitting: Family Medicine

## 2012-02-25 ENCOUNTER — Encounter: Payer: Self-pay | Admitting: Family Medicine

## 2012-02-25 VITALS — BP 138/91 | HR 86 | Temp 98.4°F | Ht 76.0 in | Wt 220.0 lb

## 2012-02-25 DIAGNOSIS — M549 Dorsalgia, unspecified: Secondary | ICD-10-CM

## 2012-02-25 DIAGNOSIS — L8990 Pressure ulcer of unspecified site, unspecified stage: Secondary | ICD-10-CM

## 2012-02-25 MED ORDER — CYCLOBENZAPRINE HCL 10 MG PO TABS: 10.0000 mg | ORAL_TABLET | Freq: Three times a day (TID) | ORAL | Status: DC | PRN

## 2012-02-25 MED ORDER — GABAPENTIN 300 MG PO CAPS: 300.0000 mg | ORAL_CAPSULE | Freq: Three times a day (TID) | ORAL | Status: DC

## 2012-02-25 MED ORDER — TRAMADOL HCL 50 MG PO TABS: 100.0000 mg | ORAL_TABLET | Freq: Three times a day (TID) | ORAL | Status: DC | PRN

## 2012-02-25 MED ORDER — AMITRIPTYLINE HCL 75 MG PO TABS: 75.0000 mg | ORAL_TABLET | Freq: Every day | ORAL | Status: DC

## 2012-02-25 NOTE — Progress Notes (Signed)
  Subjective:    Patient ID: Jeffrey [name], male    DOB: 1956/05/05, 56 y.o.   MRN: [id number]  HPI  Patient presents to clinic for refill pain medications. Patient was taking OxyContin 30 one tablet 3 times a day and Percocet 1-2 tablets every 6 hours as needed for breakthrough pain.  However, he took more than prescribed and ran out 2 weeks early.  Since last visit, he has been to ED several times for alcohol intoxication, narcotic dependence, and suicidal ideations.  Patient was given Percocet in ED, but he has run out of those as well.  UDS was positive for cocaine in ED.  He also admits to heavy drinking after he runs out of narcotics.  Patient says pain is due to sores on right thigh due to immobility.  He also complains of pain with standing and getting up to shower, etc.  He currently is in a scooter.  Review of Systems  Denies Homicidal/suicidal ideations at this time.      Objective:   Physical Exam  Constitutional: No distress.  Pulmonary/Chest: Effort normal and breath sounds normal.  Skin:       Superficial ulcers on RT lateral aspect of thigh and RT buttock.  No erythema, induration, or abscess.  Not actively bleeding or draining.  Psychiatric: His speech is normal and behavior is normal. His mood appears anxious. His affect is angry and inappropriate. He expresses no homicidal and no suicidal ideation. He expresses no suicidal plans and no homicidal plans.  Patient became very upset when I told him I would no longer prescribe narcotics.  He threw new Rx on the floor of the office, but eventually calmed down after given Rx for Flexeril.    Spent 30 minutes counseling patient on alcohol and narcotic abuse, and safety.    Assessment & Plan:

## 2012-02-25 NOTE — Assessment & Plan Note (Signed)
Stage 2 ulcers located on RT lower extremity (thigh and buttock).  No evidence of infection.  Will treat pain with Tramadol. Follow up in 1-2 weeks.

## 2012-02-25 NOTE — Assessment & Plan Note (Signed)
Due to previous ED visits for alcohol intoxication and suicidal ideations, I will no longer prescribe narcotics. Will treat chronic pain with Gabapentin, Flexeril, and Tramadol. Patient to follow up in 1-2 weeks to discuss depression and pain. [facility] information given with emphasis on crisis line.

## 2012-02-25 NOTE — Patient Instructions (Addendum)
Please start taking Tramadol, Flexeril, and Gabapentin as directed for pain. If you experience thoughts of hurting yourself or others, please call 911 or go to Emergency Room. I will also give you [facility] crisis line if you need a counselor. Follow up in 1-2 weeks to discuss depression and for pain follow up.

## 2012-03-06 ENCOUNTER — Emergency Department (HOSPITAL_COMMUNITY)
Admission: EM | Admit: 2012-03-06 | Discharge: 2012-03-06 | Disposition: A | Discharge status: Home / Self Care | Payer: Medicaid Other | Attending: Emergency Medicine | Admitting: Emergency Medicine

## 2012-03-06 ENCOUNTER — Encounter (HOSPITAL_COMMUNITY): Payer: Self-pay | Admitting: Emergency Medicine

## 2012-03-06 DIAGNOSIS — G8929 Other chronic pain: Secondary | ICD-10-CM | POA: Insufficient documentation

## 2012-03-06 DIAGNOSIS — G20A1 Parkinson's disease without dyskinesia, without mention of fluctuations: Secondary | ICD-10-CM | POA: Insufficient documentation

## 2012-03-06 DIAGNOSIS — M726 Necrotizing fasciitis: Secondary | ICD-10-CM | POA: Insufficient documentation

## 2012-03-06 DIAGNOSIS — G20 Parkinson's disease: Secondary | ICD-10-CM | POA: Insufficient documentation

## 2012-03-06 DIAGNOSIS — Z760 Encounter for issue of repeat prescription: Secondary | ICD-10-CM | POA: Insufficient documentation

## 2012-03-06 DIAGNOSIS — IMO0002 Reserved for concepts with insufficient information to code with codable children: Secondary | ICD-10-CM | POA: Insufficient documentation

## 2012-03-06 DIAGNOSIS — B1920 Unspecified viral hepatitis C without hepatic coma: Secondary | ICD-10-CM | POA: Insufficient documentation

## 2012-03-06 DIAGNOSIS — I10 Essential (primary) hypertension: Secondary | ICD-10-CM | POA: Insufficient documentation

## 2012-03-06 MED ORDER — ACETAMINOPHEN 325 MG PO TABS: 650.0000 mg | ORAL_TABLET | Freq: Once | ORAL | Status: DC

## 2012-03-06 MED ORDER — CYCLOBENZAPRINE HCL 10 MG PO TABS: 10.0000 mg | ORAL_TABLET | Freq: Two times a day (BID) | ORAL | Status: AC | PRN

## 2012-03-06 MED ORDER — GABAPENTIN 300 MG PO CAPS: 300.0000 mg | ORAL_CAPSULE | Freq: Three times a day (TID) | ORAL | Status: DC

## 2012-03-06 MED ORDER — KETOROLAC TROMETHAMINE 60 MG/2ML IM SOLN
60.0000 mg | Freq: Once | INTRAMUSCULAR | Status: AC
  Administered 2012-03-06: 60 mg via INTRAMUSCULAR
  Filled 2012-03-06: qty 2

## 2012-03-06 MED ORDER — TRAMADOL HCL 50 MG PO TABS: 50.0000 mg | ORAL_TABLET | Freq: Four times a day (QID) | ORAL | Status: AC | PRN

## 2012-03-06 NOTE — ED Notes (Signed)
Pt discharge.[facility] picked him up to take him home.GCS 15 and vital signs stable.

## 2012-03-06 NOTE — Discharge Instructions (Signed)

## 2012-03-06 NOTE — ED Provider Notes (Signed)
History     CSN: [id number]  Arrival date & time 03/06/12  0114   First MD Initiated Contact with Patient 03/06/12 0122      No chief complaint on file.   (Consider location/radiation/quality/duration/timing/severity/associated sxs/prior treatment) HPI History provided by patient. Ran out of pain medications at home and called EMS for chronic left leg pain. Patient has remote history of necrotizing fasciitis. He has history of depression. He recently overdosed on his medications with suicidal ideation. His primary physician took him off of all narcotic pain medications. Patient states he currently takes generic medications but is unable to tell me what they are. He has ran out of these pain medications. He has not called his primary doctor. He is requesting narcotic pain medications at this time. No trauma. No fevers or chills. No nausea vomiting or diarrhea. Patient denies any suicidal or homicidal ideations. He denies any drug overdose today and relates that he was taking his medications as prescribed. Past Medical History  Diagnosis Date  . DDD (degenerative disc disease)   . Necrotizing fasciitis   . Degenerative disc disease   . Hypertension   . Degenerative disc disease   . Hepatitis C   . Chronic pain   . DDD (degenerative disc disease)   . Parkinson disease   . Mental disorder   . Depression     Past Surgical History  Procedure Date  . Hip surgery   . Appendectomy   . Ankle surgery     No family history on file.  History  Substance Use Topics  . Smoking status: Former Smoker -- 1.0 packs/day for 36 years    Quit date: 07/27/2011  . Smokeless tobacco: Former Neurosurgeon    Types: Chew  . Alcohol Use: Yes      Review of Systems  Constitutional: Negative for fever and chills.  HENT: Negative for neck pain and neck stiffness.   Eyes: Negative for pain.  Respiratory: Negative for shortness of breath.   Cardiovascular: Negative for chest pain.  Gastrointestinal:  Negative for abdominal pain.  Genitourinary: Negative for dysuria.  Musculoskeletal: Negative for back pain and joint swelling.  Skin: Negative for rash.  Neurological: Negative for headaches.  All other systems reviewed and are negative.    Allergies  No known allergies  Home Medications   Current Outpatient Rx  Name Route Sig Dispense Refill  . AMLODIPINE BESYLATE 5 MG PO TABS Oral Take 1 tablet (5 mg total) by mouth daily. 31 tablet 11  . LEVOTHYROXINE SODIUM 137 MCG PO TABS Oral Take 1 tablet (137 mcg total) by mouth daily. 31 tablet 11  . TAMSULOSIN HCL 0.4 MG PO CAPS Oral Take 2 capsules (0.8 mg total) by mouth at bedtime. 62 capsule 11  . METOPROLOL TARTRATE 25 MG PO TABS Oral Take 1 tablet (25 mg total) by mouth 2 (two) times daily. 62 tablet 11    BP 117/77  Pulse 94  Temp 98 F (36.7 C) (Oral)  Resp 24  SpO2 96%  Physical Exam  Constitutional: He is oriented to person, place, and time. He appears well-developed and well-nourished.  HENT:  Head: Normocephalic and atraumatic.  Eyes: Conjunctivae and EOM are normal. Pupils are equal, round, and reactive to light.  Neck: Trachea normal. Neck supple. No thyromegaly present.  Cardiovascular: Normal rate, regular rhythm, S1 normal, S2 normal and normal pulses.     No systolic murmur is present   No diastolic murmur is present  Pulses:  Radial pulses are 2+ on the right side, and 2+ on the left side.  Pulmonary/Chest: Effort normal and breath sounds normal. He has no wheezes. He has no rhonchi. He has no rales. He exhibits no tenderness.  Abdominal: Soft. Normal appearance and bowel sounds are normal. There is no tenderness. There is no CVA tenderness and negative Murphy's sign.  Musculoskeletal:       Well-healed surgical scar to left thigh and buttock area without any erythema, fluctuance or increased warmth to touch. Distal neurovascular intact x4. No bony tenderness or deformity.  Neurological: He is alert and  oriented to person, place, and time. He has normal strength. No cranial nerve deficit or sensory deficit. GCS eye subscore is 4. GCS verbal subscore is 5. GCS motor subscore is 6.  Skin: Skin is warm and dry. No rash noted. He is not diaphoretic.  Psychiatric: His speech is normal.       No suicidal ideation    ED Course  Procedures (including critical care time)  NSAIDs provided.  2:21 AM d/w FP resident on call who knows patient well and is aware of recent overdose and past medical history. Patient is no longer prescribe narcotics given recent overdose. He is currently prescribed tramadol, neurontin 300 TID, and flexeril. He has been referred to chronic pain clinic.   Patient is aware that he is to no longer receive narcotic pain medicines for chronic pain. No indication for admission at this time. Pain complaints are all chronic in nature without acute injury or trauma. Patient does admit to suicidal ideation in the past with history of depression. He denies any suicidal ideation at this time   MDM   Chronic pain. Holding narcotic pain medications. Old records reviewed. Nursing notes reviewed. Vital signs reviewed.  No indication for imaging or blood work at this time.  I provided patient with refills of his tramadol, Neurontin and Flexeril after discussion with primary care physician resident on call. Patient stable for discharge home to followup in the clinic with his physician.       Sunnie Nielsen, MD 03/06/12 [phone]

## 2012-03-06 NOTE — ED Notes (Signed)
Per EMS pt was picked up from home, stated his  pain med taken from him because he  Attempted to kill himself . Pain having leg pain now

## 2012-03-06 NOTE — ED Notes (Signed)
Pt present to ED with chronic leg pain.

## 2012-03-13 ENCOUNTER — Telehealth (HOSPITAL_COMMUNITY): Payer: Self-pay | Admitting: Licensed Clinical Social Worker

## 2012-03-13 ENCOUNTER — Emergency Department (HOSPITAL_COMMUNITY)
Admission: EM | Admit: 2012-03-13 | Discharge: 2012-03-13 | Disposition: A | Discharge status: Home / Self Care | Payer: Medicaid Other | Attending: Emergency Medicine | Admitting: Emergency Medicine

## 2012-03-13 ENCOUNTER — Telehealth: Payer: Self-pay | Admitting: Emergency Medicine

## 2012-03-13 ENCOUNTER — Encounter (HOSPITAL_COMMUNITY): Payer: Self-pay | Admitting: *Deleted

## 2012-03-13 DIAGNOSIS — F329 Major depressive disorder, single episode, unspecified: Secondary | ICD-10-CM

## 2012-03-13 DIAGNOSIS — F3289 Other specified depressive episodes: Secondary | ICD-10-CM

## 2012-03-13 DIAGNOSIS — G8929 Other chronic pain: Secondary | ICD-10-CM | POA: Insufficient documentation

## 2012-03-13 DIAGNOSIS — R269 Unspecified abnormalities of gait and mobility: Secondary | ICD-10-CM | POA: Insufficient documentation

## 2012-03-13 DIAGNOSIS — M1610 Unilateral primary osteoarthritis, unspecified hip: Secondary | ICD-10-CM | POA: Insufficient documentation

## 2012-03-13 DIAGNOSIS — I10 Essential (primary) hypertension: Secondary | ICD-10-CM | POA: Insufficient documentation

## 2012-03-13 DIAGNOSIS — Z79899 Other long term (current) drug therapy: Secondary | ICD-10-CM | POA: Insufficient documentation

## 2012-03-13 DIAGNOSIS — M79609 Pain in unspecified limb: Secondary | ICD-10-CM | POA: Insufficient documentation

## 2012-03-13 DIAGNOSIS — R45851 Suicidal ideations: Secondary | ICD-10-CM | POA: Insufficient documentation

## 2012-03-13 DIAGNOSIS — M1710 Unilateral primary osteoarthritis, unspecified knee: Secondary | ICD-10-CM | POA: Insufficient documentation

## 2012-03-13 DIAGNOSIS — F1910 Other psychoactive substance abuse, uncomplicated: Secondary | ICD-10-CM

## 2012-03-13 DIAGNOSIS — IMO0002 Reserved for concepts with insufficient information to code with codable children: Secondary | ICD-10-CM | POA: Insufficient documentation

## 2012-03-13 DIAGNOSIS — X58XXXA Exposure to other specified factors, initial encounter: Secondary | ICD-10-CM | POA: Insufficient documentation

## 2012-03-13 DIAGNOSIS — Z8619 Personal history of other infectious and parasitic diseases: Secondary | ICD-10-CM | POA: Insufficient documentation

## 2012-03-13 DIAGNOSIS — M169 Osteoarthritis of hip, unspecified: Secondary | ICD-10-CM | POA: Insufficient documentation

## 2012-03-13 LAB — CBC WITH DIFFERENTIAL/PLATELET
Eosinophils Absolute: 0.2 10*3/uL (ref 0.0–0.7)
Eosinophils Relative: 3 % (ref 0–5)
Hemoglobin: 15.9 g/dL (ref 13.0–17.0)
Lymphs Abs: 2 10*3/uL (ref 0.7–4.0)
MCH: 31.2 pg (ref 26.0–34.0)
MCV: 88.4 fL (ref 78.0–100.0)
Monocytes Relative: 8 % (ref 3–12)
Neutrophils Relative %: 58 % (ref 43–77)
RBC: 5.1 MIL/uL (ref 4.22–5.81)

## 2012-03-13 LAB — COMPREHENSIVE METABOLIC PANEL
Alkaline Phosphatase: 93 U/L (ref 39–117)
BUN: 6 mg/dL (ref 6–23)
GFR calc Af Amer: >90 mL/min (ref >90)
GFR calc non Af Amer: >90 mL/min (ref >90)
Glucose, Bld: 97 mg/dL (ref 70–99)
Potassium: 3.4 mEq/L — ABNORMAL LOW (ref 3.5–5.1)
Total Protein: 7.5 g/dL (ref 6.0–8.3)

## 2012-03-13 LAB — RAPID URINE DRUG SCREEN, HOSP PERFORMED: Opiates: NOT DETECTED

## 2012-03-13 LAB — ETHANOL: Alcohol, Ethyl (B): 104 mg/dL — ABNORMAL HIGH (ref 0–11)

## 2012-03-13 MED ORDER — ALUM & MAG HYDROXIDE-SIMETH 200-200-20 MG/5ML PO SUSP: 30.0000 mL | ORAL | Status: DC | PRN

## 2012-03-13 MED ORDER — NICOTINE 21 MG/24HR TD PT24
21.0000 mg | MEDICATED_PATCH | Freq: Every day | TRANSDERMAL | Status: DC
  Administered 2012-03-13: 21 mg via TRANSDERMAL
  Filled 2012-03-13: qty 1

## 2012-03-13 MED ORDER — THIAMINE HCL 100 MG/ML IJ SOLN: 100.0000 mg | Freq: Every day | INTRAMUSCULAR | Status: DC

## 2012-03-13 MED ORDER — ADULT MULTIVITAMIN W/MINERALS CH
1.0000 | ORAL_TABLET | Freq: Every day | ORAL | Status: DC
  Administered 2012-03-13: 1 via ORAL
  Filled 2012-03-13: qty 1

## 2012-03-13 MED ORDER — VITAMIN B-1 100 MG PO TABS
100.0000 mg | ORAL_TABLET | Freq: Every day | ORAL | Status: DC
  Administered 2012-03-13: 100 mg via ORAL
  Filled 2012-03-13: qty 1

## 2012-03-13 MED ORDER — FOLIC ACID 1 MG PO TABS
1.0000 mg | ORAL_TABLET | Freq: Every day | ORAL | Status: DC
  Administered 2012-03-13: 1 mg via ORAL
  Filled 2012-03-13: qty 1

## 2012-03-13 MED ORDER — LEVOTHYROXINE SODIUM 137 MCG PO TABS
137.0000 ug | ORAL_TABLET | Freq: Every day | ORAL | Status: DC
Start: 2012-03-13 — End: 2012-03-14
  Administered 2012-03-13: 137 ug via ORAL
  Filled 2012-03-13 (×2): qty 1

## 2012-03-13 MED ORDER — TRAMADOL HCL 50 MG PO TABS
50.0000 mg | ORAL_TABLET | Freq: Four times a day (QID) | ORAL | Status: DC | PRN
  Administered 2012-03-13 (×2): 50 mg via ORAL
  Filled 2012-03-13 (×2): qty 1

## 2012-03-13 MED ORDER — POTASSIUM CHLORIDE CRYS ER 20 MEQ PO TBCR
40.0000 meq | EXTENDED_RELEASE_TABLET | Freq: Once | ORAL | Status: AC
  Administered 2012-03-13: 40 meq via ORAL
  Filled 2012-03-13: qty 2

## 2012-03-13 MED ORDER — BACITRACIN 500 UNIT/GM EX OINT
1.0000 "application " | TOPICAL_OINTMENT | Freq: Two times a day (BID) | CUTANEOUS | Status: DC
  Administered 2012-03-13: 1 via TOPICAL
  Filled 2012-03-13 (×2): qty 0.9

## 2012-03-13 MED ORDER — DULOXETINE HCL 60 MG PO CPEP
60.0000 mg | ORAL_CAPSULE | Freq: Every morning | ORAL | Status: DC
  Administered 2012-03-13: 60 mg via ORAL
  Filled 2012-03-13: qty 1

## 2012-03-13 MED ORDER — GABAPENTIN 300 MG PO CAPS
300.0000 mg | ORAL_CAPSULE | Freq: Three times a day (TID) | ORAL | Status: DC
  Administered 2012-03-13 (×2): 300 mg via ORAL
  Filled 2012-03-13 (×3): qty 1

## 2012-03-13 MED ORDER — AMLODIPINE BESYLATE 5 MG PO TABS
5.0000 mg | ORAL_TABLET | Freq: Every day | ORAL | Status: DC
  Administered 2012-03-13: 5 mg via ORAL
  Filled 2012-03-13: qty 1

## 2012-03-13 MED ORDER — CYCLOBENZAPRINE HCL 10 MG PO TABS
10.0000 mg | ORAL_TABLET | Freq: Two times a day (BID) | ORAL | Status: DC | PRN
  Administered 2012-03-13: 10 mg via ORAL
  Filled 2012-03-13: qty 1

## 2012-03-13 MED ORDER — LORAZEPAM 1 MG PO TABS
1.0000 mg | ORAL_TABLET | Freq: Four times a day (QID) | ORAL | Status: DC | PRN
  Administered 2012-03-13: 1 mg via ORAL
  Filled 2012-03-13: qty 1

## 2012-03-13 MED ORDER — METOPROLOL TARTRATE 25 MG PO TABS
25.0000 mg | ORAL_TABLET | Freq: Two times a day (BID) | ORAL | Status: DC
  Administered 2012-03-13: 25 mg via ORAL
  Filled 2012-03-13: qty 1

## 2012-03-13 MED ORDER — TAMSULOSIN HCL 0.4 MG PO CAPS
0.8000 mg | ORAL_CAPSULE | Freq: Every day | ORAL | Status: DC
  Filled 2012-03-13: qty 2

## 2012-03-13 MED ORDER — LORAZEPAM 2 MG/ML IJ SOLN: 1.0000 mg | Freq: Four times a day (QID) | INTRAMUSCULAR | Status: DC | PRN

## 2012-03-13 MED ORDER — LORAZEPAM 1 MG PO TABS
1.0000 mg | ORAL_TABLET | Freq: Three times a day (TID) | ORAL | Status: DC | PRN
  Administered 2012-03-13: 1 mg via ORAL
  Filled 2012-03-13: qty 1

## 2012-03-13 NOTE — ED Notes (Signed)
Bedside report received from previous RN> Safety sitter at bedside.

## 2012-03-13 NOTE — ED Notes (Signed)
md at bedside  Pt alert and oriented x4. Respirations even and unlabored, bilateral symmetrical rise and fall of chest. Skin warm and dry. In no acute distress. Denies needs.   

## 2012-03-13 NOTE — ED Provider Notes (Addendum)
History     CSN: [id number]  Arrival date & time 03/13/12  0711   First MD Initiated Contact with Patient 03/13/12 [phone]      Chief Complaint  Patient presents with  . Medical Clearance   Chief complaint "I want detox" (Consider location/radiation/quality/duration/timing/severity/associated sxs/prior treatment) HPI Patient is requesting detox from alcohol. States he's been self-medicating with alcohol because he is unable to get his pain medicine. Patient suffers from chronic left leg pain and left buttock as result of necrotizing fasciitis several years ago. Feels suicidal because he cannot get pain under control.. Last drink alcohol earlier this morning. Pain is constant. Past Medical History  Diagnosis Date  . DDD (degenerative disc disease)   . Necrotizing fasciitis   . Degenerative disc disease   . Hypertension   . Degenerative disc disease   . Hepatitis C   . Chronic pain   . DDD (degenerative disc disease)   . Parkinson disease   . Mental disorder   . Depression     Past Surgical History  Procedure Date  . Hip surgery   . Appendectomy   . Ankle surgery     No family history on file.  History  Substance Use Topics  . Smoking status: Former Smoker -- 1.0 packs/day for 36 years    Quit date: 07/27/2011  . Smokeless tobacco: Former Neurosurgeon    Types: Chew  . Alcohol Use: Yes      Review of Systems  Constitutional: Negative.   HENT: Negative.   Respiratory: Negative.   Cardiovascular: Negative.   Gastrointestinal: Negative.   Musculoskeletal: Positive for arthralgias and gait problem.       Wheelchair-bound  Skin: Negative.   Hematological: Negative.   Psychiatric/Behavioral: Positive for suicidal ideas and dysphoric mood.  All other systems reviewed and are negative.    Allergies  No known allergies  Home Medications   Current Outpatient Rx  Name Route Sig Dispense Refill  . AMLODIPINE BESYLATE 5 MG PO TABS Oral Take 1 tablet (5 mg total) by  mouth daily. 31 tablet 11  . CYCLOBENZAPRINE HCL 10 MG PO TABS Oral Take 1 tablet (10 mg total) by mouth 2 (two) times daily as needed for muscle spasms. 20 tablet 0  . GABAPENTIN 300 MG PO CAPS Oral Take 1 capsule (300 mg total) by mouth 3 (three) times daily. 15 capsule 0  . LEVOTHYROXINE SODIUM 137 MCG PO TABS Oral Take 1 tablet (137 mcg total) by mouth daily. 31 tablet 11  . METOPROLOL TARTRATE 25 MG PO TABS Oral Take 1 tablet (25 mg total) by mouth 2 (two) times daily. 62 tablet 11  . TAMSULOSIN HCL 0.4 MG PO CAPS Oral Take 2 capsules (0.8 mg total) by mouth at bedtime. 62 capsule 11  . TRAMADOL HCL 50 MG PO TABS Oral Take 1 tablet (50 mg total) by mouth every 6 (six) hours as needed for pain. 15 tablet 0    BP 121/79  Pulse 97  Temp 98.2 F (36.8 C) (Oral)  Resp 18  Wt 215 lb (97.523 kg)  SpO2 97%  Physical Exam  Nursing note and vitals reviewed. Constitutional: He appears well-developed and well-nourished.  HENT:  Head: Normocephalic and atraumatic.  Eyes: Conjunctivae are normal. Pupils are equal, round, and reactive to light.  Neck: Neck supple. No tracheal deviation present. No thyromegaly present.  Cardiovascular: Normal rate and regular rhythm.   No murmur heard. Pulmonary/Chest: Effort normal and breath sounds normal.  Abdominal: Soft. Bowel  sounds are normal. He exhibits no distension. There is no tenderness.  Musculoskeletal: Normal range of motion. He exhibits no edema and no tenderness.  Neurological: He is alert. Coordination normal.       Left lower extremity with time sized abrasion over lateral aspect of knee, clean appearing moves all extremities  Skin: Skin is warm and dry. No rash noted.  Psychiatric: He has a normal mood and affect.    ED Course  Procedures (including critical care time)   Labs Reviewed  ETHANOL  URINE RAPID DRUG SCREEN (HOSP PERFORMED)  CBC WITH DIFFERENTIAL  COMPREHENSIVE METABOLIC PANEL   No results found.   No diagnosis  found. Results for orders placed during the hospital encounter of 03/13/12  ETHANOL      Component Value Range   Alcohol, Ethyl (B) 104 (*) 0 - 11 mg/dL  URINE RAPID DRUG SCREEN (HOSP PERFORMED)      Component Value Range   Opiates NONE DETECTED  NONE DETECTED   Cocaine POSITIVE (*) NONE DETECTED   Benzodiazepines NONE DETECTED  NONE DETECTED   Amphetamines NONE DETECTED  NONE DETECTED   Tetrahydrocannabinol POSITIVE (*) NONE DETECTED   Barbiturates NONE DETECTED  NONE DETECTED  CBC WITH DIFFERENTIAL      Component Value Range   WBC 6.5  4.0 - 10.5 K/uL   RBC 5.10  4.22 - 5.81 MIL/uL   Hemoglobin 15.9  13.0 - 17.0 g/dL   HCT 16.1  09.6 - 04.5 %   MCV 88.4  78.0 - 100.0 fL   MCH 31.2  26.0 - 34.0 pg   MCHC 35.3  30.0 - 36.0 g/dL   RDW 40.9  81.1 - 91.4 %   Platelets 150  150 - 400 K/uL   Neutrophils Relative 58  43 - 77 %   Neutro Abs 3.8  1.7 - 7.7 K/uL   Lymphocytes Relative 30  12 - 46 %   Lymphs Abs 2.0  0.7 - 4.0 K/uL   Monocytes Relative 8  3 - 12 %   Monocytes Absolute 0.5  0.1 - 1.0 K/uL   Eosinophils Relative 3  0 - 5 %   Eosinophils Absolute 0.2  0.0 - 0.7 K/uL   Basophils Relative 1  0 - 1 %   Basophils Absolute 0.0  0.0 - 0.1 K/uL  COMPREHENSIVE METABOLIC PANEL      Component Value Range   Sodium 138  135 - 145 mEq/L   Potassium 3.4 (*) 3.5 - 5.1 mEq/L   Chloride 100  96 - 112 mEq/L   CO2 22  19 - 32 mEq/L   Glucose, Bld 97  70 - 99 mg/dL   BUN 6  6 - 23 mg/dL   Creatinine, Ser 7.82  0.50 - 1.35 mg/dL   Calcium 9.2  8.4 - 95.6 mg/dL   Total Protein 7.5  6.0 - 8.3 g/dL   Albumin 3.6  3.5 - 5.2 g/dL   AST 19  0 - 37 U/L   ALT 8  0 - 53 U/L   Alkaline Phosphatase 93  39 - 117 U/L   Total Bilirubin 0.4  0.3 - 1.2 mg/dL   GFR calc non Af Amer >90  >90 mL/min   GFR calc Af Amer >90  >90 mL/min   No results found.    MDM  Tele-psychiatry consult called to evaluate patient for suicidal intent.Act team called to evaluate patient for detox  Psychiatric  consult reviewed, psychiatric admission felt to  be warranted as patient could be a danger to self if discharged. Cymbalta 60 milligrams daily ordered Diagnosis number suicidal ideation #2 polysubstance abuse #3 chronic pain #4 hypokalemia #5 abrasion to left knee     Doug Sou, MD 03/13/12 1527  Addendum 5:20 PM patient resting comfortably stating he feels better. I have consult to Dr Izell Astoria, from psychiatry to evaluate patient again to aid in his disposition.  Doug Sou, MD 03/13/12 [phone]

## 2012-03-13 NOTE — ED Notes (Signed)
telepysch ready.

## 2012-03-13 NOTE — ED Notes (Signed)
Pt. Pending telepsych consult, all paperwork faxed, phone appointment made.

## 2012-03-13 NOTE — Progress Notes (Signed)
CSW consulted by ACT to assist with pt's discharge. Per unit psychiatrist, treatment recommended is outpatient services with assistance with ALF placement. EDP Knapp notified and is in agreement with the recommendation to discharge home. CSW met with pt and reviewed outpatient resources such as [facility], [place], mobile crisis, self help groups etc. Pt is agreeable to discharge at this time and is reporting no risks of harm to self. CSW also discussed with the pt the need to contact the [facility] CSW, Theresia Bough,  that has been working with him. According to documentation, [facility] CSW has been consistently trying to contact the pt without a response. Pt is stating that his phone has been turned back on at this time and he plans to contact [facility] on 03/14/12. CSW has also left a voicemail message for [place] [facility] regarding the pt's needs. Pt will transport via [facility] as pt states he has difficulty with ambulating. No further needs at this time.

## 2012-03-13 NOTE — ED Notes (Signed)
Report called to [facility], pt. Transferred to Acute Rm. 22.

## 2012-03-13 NOTE — ED Notes (Signed)
Pt moved to pysch ed. Pt can walk very slowly. Pt holds onto side rails.

## 2012-03-13 NOTE — ED Notes (Signed)
[id number]<IE> Expected date:03/13/12<BR> Expected time: 2:01 PM<BR> Means of arrival:<BR> Comments:<BR> closed

## 2012-03-13 NOTE — ED Notes (Signed)
Madison Hickman, rn at [facility] called and requested information about pt

## 2012-03-13 NOTE — Telephone Encounter (Signed)
Patient called the emergency line.  When I called him back he was very tearful and there was a lot of background traffic noise making it difficult to hear him.  He was complaining of severe pain and making statements like "no one should have to live like this," and "I've made a mistake," and "I can't keep drinking away the pain."  He stated that [place] was [place] [id number].  He reported that he was not in danger unless he decided to injury himself, and that was not out of the question.  I strongly recommended that he hang up and call 911 to have EMS come pick him up and take him to an emergency where he can be evaluated.  Patient expressed some agreement, but during reinforcement, the call was disconnected.  When I called the patient back, he became tearful again and stated that "I thought you were done talking to me."  He also stated that he was going to use the restroom, then call 911.
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
## 2012-03-13 NOTE — ED Notes (Signed)
[facility] called for transport home. 
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
## 2012-03-13 NOTE — ED Notes (Signed)
Pt. Unable to walk, notified EDP,[place] RN, pt. To move to Rm22.

## 2012-03-13 NOTE — ED Notes (Signed)
[id number]> Expected date:03/13/12<BR> Expected time: 6:48 AM<BR> Means of arrival:Ambulance<BR> Comments:<BR> Chronic pain, knee and hip, out of home meds.

## 2012-03-13 NOTE — ED Notes (Signed)
Report given to jennifer, rn in pysch

## 2012-03-13 NOTE — ED Notes (Signed)
Act team at bedside 

## 2012-03-13 NOTE — ED Notes (Signed)
Pt states "they give me pain medicine and tell me I can go but I'm halfway between [place] and here, it costs me $20 to get a ride either way, they called and told me if I didn't come get my pain meds they were going to destroy them, I've been self lubricating, I've drank 4.5 gallons over the last 3 days, if I don't get some relief I'm going to kill myself"

## 2012-03-13 NOTE — ED Provider Notes (Signed)
Dr. Shela Commons (psychiatry) evaluated the patient.  He does not feel the patient needs to be admitted.  Pt will be referred to outpt treatment.  Celene Kras, MD 03/13/12 2003

## 2012-03-14 ENCOUNTER — Telehealth: Payer: Self-pay | Admitting: Clinical

## 2012-03-14 ENCOUNTER — Telehealth: Payer: Self-pay | Admitting: Family Medicine

## 2012-03-14 NOTE — Telephone Encounter (Signed)
Clinical Social Worker (CSW) made a referral to [facility] as pt has several ED visits.   Theresia Bough, MSW, Theresia Majors [phone]

## 2012-03-14 NOTE — Telephone Encounter (Signed)
Patient is calling because he was prescribed Flexeril, Gabapentin and Tramadol from the Hospital, but apparently he left the meds at the hospital and when they called, he told them to destroy them because there was no way that he would be able to get back to get them.  He had been prescribed those 3 medication on 7/1 and then again on 7/8 along with several others, but after leaving them at the Hospital, he doesn't have any and he is medicating himself with Alcohol.  "My life is a mess and I am just messing it up further, but I am just doing the best that I can."

## 2012-03-14 NOTE — Progress Notes (Signed)
Thank you to Theresia Bough LCSW for this referral.  We are not in network with this primary payer.For any additional questions or new referrals please contact Anibal Henderson BSN RN [facility] Liaison at [phone].

## 2012-03-15 ENCOUNTER — Telehealth: Payer: Self-pay | Admitting: Family Medicine

## 2012-03-15 ENCOUNTER — Encounter (HOSPITAL_COMMUNITY): Payer: Self-pay | Admitting: Emergency Medicine

## 2012-03-15 ENCOUNTER — Emergency Department (HOSPITAL_COMMUNITY)
Admission: EM | Admit: 2012-03-15 | Discharge: 2012-03-15 | Disposition: A | Discharge status: Home / Self Care | Payer: Medicaid Other | Attending: Emergency Medicine | Admitting: Emergency Medicine

## 2012-03-15 DIAGNOSIS — Z79899 Other long term (current) drug therapy: Secondary | ICD-10-CM | POA: Insufficient documentation

## 2012-03-15 DIAGNOSIS — I10 Essential (primary) hypertension: Secondary | ICD-10-CM | POA: Insufficient documentation

## 2012-03-15 DIAGNOSIS — R000 Tachycardia, unspecified: Secondary | ICD-10-CM | POA: Insufficient documentation

## 2012-03-15 DIAGNOSIS — G8929 Other chronic pain: Secondary | ICD-10-CM | POA: Insufficient documentation

## 2012-03-15 MED ORDER — HYDROMORPHONE HCL PF 2 MG/ML IJ SOLN
2.0000 mg | Freq: Once | INTRAMUSCULAR | Status: AC
  Administered 2012-03-15: 2 mg via INTRAMUSCULAR

## 2012-03-15 MED ORDER — ONDANSETRON 4 MG PO TBDP
8.0000 mg | ORAL_TABLET | Freq: Once | ORAL | Status: AC
  Administered 2012-03-15: 8 mg via ORAL

## 2012-03-15 MED ORDER — ONDANSETRON 8 MG PO TBDP: 8.0000 mg | ORAL_TABLET | Freq: Three times a day (TID) | ORAL | Status: DC | PRN

## 2012-03-15 MED ORDER — ONDANSETRON 4 MG PO TBDP
ORAL_TABLET | ORAL | Status: AC
  Filled 2012-03-15: qty 2

## 2012-03-15 MED ORDER — HYDROMORPHONE HCL PF 2 MG/ML IJ SOLN
INTRAMUSCULAR | Status: AC
  Filled 2012-03-15: qty 1

## 2012-03-15 NOTE — Telephone Encounter (Signed)
Patient is calling back to find out what was going to be done about his call from yesterday.  Now he is talking about Lopressor and Flomax needing to be refilled as the 3 from yesterday.

## 2012-03-15 NOTE — ED Provider Notes (Signed)
History     CSN: [id number]  Arrival date & time 03/15/12  0309   First MD Initiated Contact with Patient 03/15/12 0320      Chief Complaint  Patient presents with  . Leg Pain  . Hip Pain    The history is provided by the patient.   the patient is a long-standing history of chronic pain and reports that for the last 2-3 weeks he has been out of his chronic pain medication and instead his been drinking alcohol to help the symptoms.  He denies abdominal pain.  He reports presenting with his typical chronic pain which includes hip pain and knee pain.  He is requesting pain medications in the emergency department and to be discharged home with.  He reports his been trying to work with his pain physicians but has not able to get an appointment until July 21.  Denies hematemesis melena or hematochezia.  He has no diarrhea.  His nausea and vomiting just started since arrival to the emergency department.  His pain is severe at this time.  Past Medical History  Diagnosis Date  . DDD (degenerative disc disease)   . Necrotizing fasciitis   . Degenerative disc disease   . Hypertension   . Degenerative disc disease   . Hepatitis C   . Chronic pain   . DDD (degenerative disc disease)   . Parkinson disease   . Mental disorder   . Depression     Past Surgical History  Procedure Date  . Hip surgery   . Appendectomy   . Ankle surgery     No family history on file.  History  Substance Use Topics  . Smoking status: Former Smoker -- 1.0 packs/day for 36 years    Quit date: 07/27/2011  . Smokeless tobacco: Former Neurosurgeon    Types: Chew  . Alcohol Use: Yes      Review of Systems  All other systems reviewed and are negative.    Allergies  No known allergies  Home Medications   Current Outpatient Rx  Name Route Sig Dispense Refill  . CYCLOBENZAPRINE HCL 10 MG PO TABS Oral Take 1 tablet (10 mg total) by mouth 2 (two) times daily as needed for muscle spasms. 20 tablet 0  .  GABAPENTIN 300 MG PO CAPS Oral Take 1 capsule (300 mg total) by mouth 3 (three) times daily. 15 capsule 0  . TAMSULOSIN HCL 0.4 MG PO CAPS Oral Take 2 capsules (0.8 mg total) by mouth at bedtime. 62 capsule 11  . TRAMADOL HCL 50 MG PO TABS Oral Take 1 tablet (50 mg total) by mouth every 6 (six) hours as needed for pain. 15 tablet 0  . LEVOTHYROXINE SODIUM 137 MCG PO TABS Oral Take 1 tablet (137 mcg total) by mouth daily. 31 tablet 11  . METOPROLOL TARTRATE 25 MG PO TABS Oral Take 1 tablet (25 mg total) by mouth 2 (two) times daily. 62 tablet 11  . ONDANSETRON 8 MG PO TBDP Oral Take 1 tablet (8 mg total) by mouth every 8 (eight) hours as needed for nausea. 10 tablet 0    BP 154/94  Pulse 106  Temp 98.5 F (36.9 C) (Oral)  Resp 22  SpO2 97%  Physical Exam  Nursing note and vitals reviewed. Constitutional: He is oriented to person, place, and time. He appears well-developed and well-nourished.  HENT:  Head: Normocephalic and atraumatic.  Eyes: EOM are normal.  Neck: Normal range of motion.  Cardiovascular: Regular rhythm  and normal heart sounds.        tachycardia  Pulmonary/Chest: Effort normal and breath sounds normal. No respiratory distress.  Abdominal: Soft. He exhibits no distension. There is no tenderness.  Musculoskeletal: Normal range of motion.  Neurological: He is alert and oriented to person, place, and time.  Skin: Skin is warm and dry.  Psychiatric: He has a normal mood and affect. Judgment normal.    ED Course  Procedures (including critical care time)  Labs Reviewed - No data to display No results found.   1. Chronic pain       MDM  I suspect the patient's nausea secondary to narcotic withdrawal.  The patient was given a single dose of narcotics in the emergency apartment for his chronic pain.  He was discharged home to followup with his chronic pain physicians.  He was prescribed Zofran for his nausea.        Lyanne Co, MD 03/15/12 [phone]

## 2012-03-15 NOTE — ED Notes (Addendum)
Per EMS, pt was diagnosed with flesh eating bacteria a couple years ago. He has chronic pain. He stated that he came to the ED for the same left leg and hip pain. He stated that his home pain medications were left in the EMS truck and his prescriptions were left at the hospital. Since then he has been out of pain medication for 3 weeks and the pain has become unbearable. Pulse are palpable in left leg. Capillary refill brisk. Leg warm to touch. Pt has black chronic wound x 2 on great toe.

## 2012-03-15 NOTE — Telephone Encounter (Signed)
Patient needs to contact pharmacy to send refill requests to MD.  Please remind him of our 48 hour refill request policy.  Thanks.

## 2012-03-15 NOTE — Telephone Encounter (Signed)
EMERGENCY LINE CALL: Patient calling screaming about not having his pain medications.  Explained to pt that I cannot Rx pain medications. Patient asked me, at least 5 times, which hospital I was working at [facility].  Explained to patient that even if he were to come to [facility], I would not be Rxing pain medications.  Explained to pt that I see the note that he is getting his pain meds at a pain clinic and patient stated that was true, but then asking for gabapentin and tramadol.  Again explained to pt that I cannot Rx ANY medications over the phone, or tonight in the ED.  Gave patient the option to come to the ED if he is in too much pain vs calling clinic in the morning to be see as SDA.  However, explained to pt that even if he comes in to clinic, generally, only the PCP will Rx pain medications for chronic pain.  Paitient sounded calmer at the end of the conversation and stated that he understood his options.

## 2012-03-16 ENCOUNTER — Telehealth: Payer: Self-pay | Admitting: *Deleted

## 2012-03-16 ENCOUNTER — Emergency Department (HOSPITAL_COMMUNITY)
Admission: EM | Admit: 2012-03-16 | Discharge: 2012-03-16 | Disposition: A | Discharge status: Home / Self Care | Payer: Medicaid Other | Attending: Emergency Medicine | Admitting: Emergency Medicine

## 2012-03-16 DIAGNOSIS — F489 Nonpsychotic mental disorder, unspecified: Secondary | ICD-10-CM | POA: Insufficient documentation

## 2012-03-16 DIAGNOSIS — Z87891 Personal history of nicotine dependence: Secondary | ICD-10-CM | POA: Insufficient documentation

## 2012-03-16 DIAGNOSIS — B1920 Unspecified viral hepatitis C without hepatic coma: Secondary | ICD-10-CM | POA: Insufficient documentation

## 2012-03-16 DIAGNOSIS — I10 Essential (primary) hypertension: Secondary | ICD-10-CM | POA: Insufficient documentation

## 2012-03-16 DIAGNOSIS — F3289 Other specified depressive episodes: Secondary | ICD-10-CM | POA: Insufficient documentation

## 2012-03-16 DIAGNOSIS — R109 Unspecified abdominal pain: Secondary | ICD-10-CM | POA: Insufficient documentation

## 2012-03-16 DIAGNOSIS — M726 Necrotizing fasciitis: Secondary | ICD-10-CM | POA: Insufficient documentation

## 2012-03-16 DIAGNOSIS — IMO0002 Reserved for concepts with insufficient information to code with codable children: Secondary | ICD-10-CM | POA: Insufficient documentation

## 2012-03-16 DIAGNOSIS — G8929 Other chronic pain: Secondary | ICD-10-CM

## 2012-03-16 DIAGNOSIS — F329 Major depressive disorder, single episode, unspecified: Secondary | ICD-10-CM | POA: Insufficient documentation

## 2012-03-16 DIAGNOSIS — R112 Nausea with vomiting, unspecified: Secondary | ICD-10-CM | POA: Insufficient documentation

## 2012-03-16 DIAGNOSIS — G20A1 Parkinson's disease without dyskinesia, without mention of fluctuations: Secondary | ICD-10-CM | POA: Insufficient documentation

## 2012-03-16 DIAGNOSIS — G20 Parkinson's disease: Secondary | ICD-10-CM | POA: Insufficient documentation

## 2012-03-16 MED ORDER — ONDANSETRON HCL 4 MG/2ML IJ SOLN
INTRAMUSCULAR | Status: AC
  Filled 2012-03-16: qty 2

## 2012-03-16 MED ORDER — TRAMADOL HCL 50 MG PO TABS
50.0000 mg | ORAL_TABLET | Freq: Once | ORAL | Status: AC
  Administered 2012-03-16: 50 mg via ORAL
  Filled 2012-03-16: qty 1

## 2012-03-16 MED ORDER — CYCLOBENZAPRINE HCL 10 MG PO TABS
5.0000 mg | ORAL_TABLET | Freq: Once | ORAL | Status: AC
  Administered 2012-03-16: 5 mg via ORAL
  Filled 2012-03-16: qty 1

## 2012-03-16 NOTE — ED Notes (Signed)
The patient states that he ran out of his prescription pain meds about 3 weeks ago.  Today, he presents c/o nausea, vomiting (not witnessed), and abdominal pain.  The patient claims that he does not believe he is having DTs since "it would have happened a long time ago."

## 2012-03-16 NOTE — ED Notes (Signed)
Security advised [employer] that the patient was refusing to leave and that he was yelling out foul language.

## 2012-03-16 NOTE — Telephone Encounter (Signed)
Patient was seen in ED late last night/early morning and brought to our office by security.  Patient requesting refill of meds.  States he was seen in ED "3 weeks ago" and meds were refilled, but he left them at the hospital and "they had to be destroyed" because he was unable to go back and pick the meds up.  Patient requesting refill of Tramadol, Flexeril, Flomax, gabapentin, and Lopressor.  Per Dr. Sherron Flemings Cruz---patient instructed to contact his pharmacy and they will call us with refill request.  Patient verbalized understanding and contacted pharmacy while here in office.  Follow-up appt scheduled for 04/05/12 at 1:45pm.  Gaylene Brooks, RN

## 2012-03-16 NOTE — ED Notes (Signed)
I advised the patient that the provider stated that he must renew his long-term narcotic pain medicine prescriptions through his [facility] /specialist/pain mgmt doctor.  The patient is refusing to sign his discharge instructions, and he is refusing to leave.  I advised the provider, and he agreed that we were done with his treatment and that the patient can be escorted off of the premises.

## 2012-03-16 NOTE — ED Provider Notes (Signed)
Medical screening examination/treatment/procedure(s) were performed by non-physician practitioner and as supervising physician I was immediately available for consultation/collaboration.   Charles B. Bernette Mayers, MD 03/16/12 [phone]

## 2012-03-16 NOTE — ED Notes (Signed)
See free text notes.

## 2012-03-16 NOTE — ED Notes (Signed)
Contacted security and advised that the patient was to be escorted off of the premeises.

## 2012-03-16 NOTE — ED Provider Notes (Signed)
History     CSN: [id number]  Arrival date & time 03/16/12  0348   First MD Initiated Contact with Patient 03/16/12 0400      Chief Complaint  Patient presents with  . Nausea  . Emesis  . Abdominal Pain   HPI  History provided by the patient and recent medical chart. Patient is a 56 year old male with history is of chronic pain in multiple visits to the emergency department. Patient was seen yesterday and 2 days prior in the emergency room and this makes his fourth visit this month already for similar complaints. Patient states he he returned home and had episodes of heaving. He denies any nausea vomiting symptoms but states his heaving caused his abdomen to her increased pain. Patient states he thinks he may have torn a muscle in the stomach. Pain is sometimes worse with movements and with palpation. Patient also complains of chronic degenerative back pains. Patient states he is currently without any home pain medications because of displacement of his medications on his last emergency room visits. Patient has been trying to followup with his primary care provider but has been unable. He denies any other associated symptoms. Denies any fever, chills, sweats, diarrhea or constipation.      Past Medical History  Diagnosis Date  . DDD (degenerative disc disease)   . Necrotizing fasciitis   . Degenerative disc disease   . Hypertension   . Degenerative disc disease   . Hepatitis C   . Chronic pain   . DDD (degenerative disc disease)   . Parkinson disease   . Mental disorder   . Depression     Past Surgical History  Procedure Date  . Hip surgery   . Appendectomy   . Ankle surgery     No family history on file.  History  Substance Use Topics  . Smoking status: Former Smoker -- 1.0 packs/day for 36 years    Quit date: 07/27/2011  . Smokeless tobacco: Former Neurosurgeon    Types: Chew  . Alcohol Use: Yes      Review of Systems  Constitutional: Negative for fever, chills and  appetite change.  Respiratory: Negative for shortness of breath.   Cardiovascular: Negative for chest pain.  Gastrointestinal: Positive for abdominal pain. Negative for nausea, vomiting, diarrhea and constipation.    Allergies  No known allergies  Home Medications   Current Outpatient Rx  Name Route Sig Dispense Refill  . CYCLOBENZAPRINE HCL 10 MG PO TABS Oral Take 1 tablet (10 mg total) by mouth 2 (two) times daily as needed for muscle spasms. 20 tablet 0  . GABAPENTIN 300 MG PO CAPS Oral Take 1 capsule (300 mg total) by mouth 3 (three) times daily. 15 capsule 0  . METOPROLOL TARTRATE 25 MG PO TABS Oral Take 1 tablet (25 mg total) by mouth 2 (two) times daily. 62 tablet 11  . TAMSULOSIN HCL 0.4 MG PO CAPS Oral Take 2 capsules (0.8 mg total) by mouth at bedtime. 62 capsule 11  . TRAMADOL HCL 50 MG PO TABS Oral Take 1 tablet (50 mg total) by mouth every 6 (six) hours as needed for pain. 15 tablet 0    BP 135/81  Pulse 82  Temp 97.6 F (36.4 C) (Oral)  Resp 20  Ht 6\' 4"  (1.93 m)  Wt 220 lb (99.791 kg)  BMI 26.78 kg/m2  SpO2 98%  Physical Exam  Nursing note and vitals reviewed. Constitutional: He is oriented to person, place, and time. He  appears well-developed and well-nourished. No distress.  HENT:  Head: Normocephalic.  Cardiovascular: Normal rate and regular rhythm.   Pulmonary/Chest: Effort normal and breath sounds normal.  Abdominal: Soft. He exhibits no distension. There is tenderness. There is no rebound and no guarding.       Patient reports diffuse tenderness. Patient flexes abdominal muscles normally with sitting up. There is no swelling or mass.  Neurological: He is alert and oriented to person, place, and time.  Skin: Skin is warm. No erythema.  Psychiatric: He has a normal mood and affect. His behavior is normal.    ED Course  Procedures       1. Chronic pain       MDM  Patient seen and evaluated. Patient in no acute distress. Patient has multiple  visits to the emergency room. Patient has not had any active vomiting or heaving on the emergency room. Patient has no concerning findings on abdominal exam. Patient had recent lab testing performed within the last few days without any abnormalities. At this time I do not suspect any acute abdominal process. Plan to give 1 dose of pain medication and muscle relaxers. Patient states he has also return phone calls to be placed in a facility where you may receive additional care with activities of daily living.        Angus Seller, [place] 03/16/12 [phone]

## 2012-03-24 ENCOUNTER — Telehealth: Payer: Self-pay | Admitting: Clinical

## 2012-03-24 NOTE — Telephone Encounter (Signed)
Clinical Child psychotherapist (CSW) informed by Amy from [facility] that a home visit was completed with pt and that pt has been offered a bed at their facility. CSW to update an FL2 and submit to facility. Pt will not be admitted into facility into the first week of August per pt request. CSW will update an FL2 to have PCP sign and submit to facility.  Theresia Bough, MSW, Theresia Majors [phone]

## 2012-03-29 ENCOUNTER — Other Ambulatory Visit: Payer: Self-pay | Admitting: Family Medicine

## 2012-03-29 ENCOUNTER — Telehealth: Payer: Self-pay | Admitting: *Deleted

## 2012-03-29 MED ORDER — CYCLOBENZAPRINE HCL 5 MG PO TABS: 5.0000 mg | ORAL_TABLET | Freq: Three times a day (TID) | ORAL | Status: AC | PRN

## 2012-03-29 MED ORDER — GABAPENTIN 300 MG PO CAPS: 300.0000 mg | ORAL_CAPSULE | Freq: Three times a day (TID) | ORAL | Status: DC

## 2012-03-29 NOTE — Telephone Encounter (Signed)
Received refill request for cyclobenzaprine. Faxed request states 10 mg tabs one tab twice daily as needed. In chart states 5 mg one three times daily.  Pharmacy is [address], [address].

## 2012-03-29 NOTE — Telephone Encounter (Signed)
Called pt [facility] with detailed information instructing him of the following:  Dr.de la Sondra Come will refill Flexeril 5 mg to last him until 8/2, his appt has been cancelled and he will have to obtain further refills through the facility that he is staying in.Loralee Pacas [place]

## 2012-03-29 NOTE — Telephone Encounter (Signed)
Forwarded to pcp.Jeffrey [name] Jeffrey [name]  

## 2012-03-29 NOTE — Telephone Encounter (Signed)
I will give patient one last refill for Flexeril 5 mg enough to last until 8/2.  Patient does not need an appointment with me since he is going to a facility soon.  Please cancel appointment and let patient know that from now on, refills will be through his new facility. Thanks.

## 2012-03-29 NOTE — Telephone Encounter (Signed)
Patient is calling for a refill on Flexeril sent to [facility].  He also isn't sure if he needs to keep his appt on 7/31 because he will be going into a facility on 8/2 and they will be managing his medications.  I instructed him to check with that facility and I would check with his MD to see if he needed to keep that appt and someone would call him back.

## 2012-03-30 ENCOUNTER — Encounter (HOSPITAL_COMMUNITY): Payer: Self-pay | Admitting: Emergency Medicine

## 2012-03-30 ENCOUNTER — Emergency Department (HOSPITAL_COMMUNITY)
Admission: EM | Admit: 2012-03-30 | Discharge: 2012-03-30 | Disposition: A | Discharge status: Home / Self Care | Payer: Medicaid Other | Attending: Emergency Medicine | Admitting: Emergency Medicine

## 2012-03-30 DIAGNOSIS — G20A1 Parkinson's disease without dyskinesia, without mention of fluctuations: Secondary | ICD-10-CM | POA: Insufficient documentation

## 2012-03-30 DIAGNOSIS — G20 Parkinson's disease: Secondary | ICD-10-CM | POA: Insufficient documentation

## 2012-03-30 DIAGNOSIS — Z87891 Personal history of nicotine dependence: Secondary | ICD-10-CM | POA: Insufficient documentation

## 2012-03-30 DIAGNOSIS — I10 Essential (primary) hypertension: Secondary | ICD-10-CM | POA: Insufficient documentation

## 2012-03-30 DIAGNOSIS — G8929 Other chronic pain: Secondary | ICD-10-CM

## 2012-03-30 DIAGNOSIS — Z8619 Personal history of other infectious and parasitic diseases: Secondary | ICD-10-CM | POA: Insufficient documentation

## 2012-03-30 DIAGNOSIS — IMO0002 Reserved for concepts with insufficient information to code with codable children: Secondary | ICD-10-CM | POA: Insufficient documentation

## 2012-03-30 DIAGNOSIS — G894 Chronic pain syndrome: Secondary | ICD-10-CM | POA: Insufficient documentation

## 2012-03-30 MED ORDER — ONDANSETRON HCL 4 MG PO TABS
4.0000 mg | ORAL_TABLET | Freq: Once | ORAL | Status: AC
  Administered 2012-03-30: 4 mg via ORAL
  Filled 2012-03-30: qty 1

## 2012-03-30 MED ORDER — KETOROLAC TROMETHAMINE 30 MG/ML IJ SOLN
60.0000 mg | Freq: Once | INTRAMUSCULAR | Status: AC
  Administered 2012-03-30: 60 mg via INTRAMUSCULAR
  Filled 2012-03-30: qty 2

## 2012-03-30 MED ORDER — HYDROMORPHONE HCL PF 2 MG/ML IJ SOLN
2.0000 mg | Freq: Once | INTRAMUSCULAR | Status: AC
  Administered 2012-03-30: 2 mg via INTRAMUSCULAR
  Filled 2012-03-30: qty 1

## 2012-03-30 NOTE — ED Provider Notes (Signed)
History     CSN: [id number]  Arrival date & time 03/30/12  0014   First MD Initiated Contact with Patient 03/30/12 0019      Chief Complaint  Patient presents with  . Leg Pain  . Back Pain    (Consider location/radiation/quality/duration/timing/severity/associated sxs/prior treatment) HPI  Jeffrey [name] is a 56 y.o. male  With a h/o chronic pain brought in by ambulance, who presents to the Emergency Department complaining of back pain, leg pain, knee pain x 2 days. Patient is followed by Dr. Tye Savoy at [facility]. Recently was taken off narcotics and placed on tramadol. He is in the process of being evaluated for pain management. He was turned down by one pain management group. He is currently living alone and in the process of moving to an assisted living facility. He has been to the ER x 5 this month for pain management.   PCp Dr. Tye Savoy  Past Medical History  Diagnosis Date  . DDD (degenerative disc disease)   . Necrotizing fasciitis   . Degenerative disc disease   . Hypertension   . Degenerative disc disease   . Hepatitis C   . Chronic pain   . DDD (degenerative disc disease)   . Parkinson disease   . Mental disorder   . Depression   . DDD (degenerative disc disease)     Past Surgical History  Procedure Date  . Hip surgery   . Appendectomy   . Ankle surgery     History reviewed. No pertinent family history.  History  Substance Use Topics  . Smoking status: Former Smoker -- 1.0 packs/day for 36 years    Quit date: 07/27/2011  . Smokeless tobacco: Former Neurosurgeon    Types: Chew  . Alcohol Use: Yes      Review of Systems  Constitutional: Negative for fever.       10 Systems reviewed and are negative for acute change except as noted in the HPI.  HENT: Negative for congestion.   Eyes: Negative for discharge and redness.  Respiratory: Negative for cough and shortness of breath.   Cardiovascular: Negative for chest pain.  Gastrointestinal: Negative for  vomiting and abdominal pain.  Musculoskeletal: Negative for back pain.       Left hip pain, left knee pain, bilateral leg pain, lower back pain  Skin: Negative for rash.  Neurological: Negative for syncope, numbness and headaches.  Psychiatric/Behavioral:       No behavior change.    Allergies  No known allergies  Home Medications   Current Outpatient Rx  Name Route Sig Dispense Refill  . TRAMADOL HCL 50 MG PO TABS Oral Take 50 mg by mouth every 6 (six) hours as needed.    . CYCLOBENZAPRINE HCL 5 MG PO TABS Oral Take 1 tablet (5 mg total) by mouth 3 (three) times daily as needed for muscle spasms. 30 tablet 0  . GABAPENTIN 300 MG PO CAPS Oral Take 1 capsule (300 mg total) by mouth 3 (three) times daily. 30 capsule 0  . METOPROLOL TARTRATE 25 MG PO TABS Oral Take 1 tablet (25 mg total) by mouth 2 (two) times daily. 62 tablet 11  . TAMSULOSIN HCL 0.4 MG PO CAPS Oral Take 2 capsules (0.8 mg total) by mouth at bedtime. 62 capsule 11    BP 133/84  Pulse 66  Temp 98.4 F (36.9 C) (Oral)  Resp 17  Ht 6\' 4"  (1.93 m)  Wt 218 lb (98.884 kg)  BMI 26.54 kg/m2  SpO2 98%  Physical Exam  Nursing note and vitals reviewed. Constitutional:       Awake, alert, nontoxic appearance.  HENT:  Head: Atraumatic.  Eyes: Pupils are equal, round, and reactive to light. Right eye exhibits no discharge. Left eye exhibits no discharge.  Neck: Neck supple.  Cardiovascular: Normal rate, regular rhythm and normal heart sounds.   No murmur heard. Pulmonary/Chest: Effort normal and breath sounds normal. No respiratory distress. He has no wheezes. He has no rales. He exhibits no tenderness.  Abdominal: Soft. Bowel sounds are normal. He exhibits no mass. There is no tenderness. There is no rebound.  Musculoskeletal: He exhibits no tenderness.       Thoracic back: He exhibits no tenderness.       Lumbar back: He exhibits no tenderness.       Baseline ROM, no obvious new focal weakness.No spinal tenderness  to percussion. Scarring to left leg from previous surgeries.  Neurological:       Mental status and motor strength appears baseline for patient and situation.  Skin: No rash noted.  Psychiatric: He has a normal mood and affect.    ED Course  Procedures (including critical care time)    MDM  Patient with chronic pain syndrome here pain to his back, legs, hip. Given analgesic, antiinflammatory, antiemetic. He is to follow up with his PCP tomorrow.Pt stable in ED with no significant deterioration in condition.The patient appears reasonably screened and/or stabilized for discharge and I doubt any other medical condition or other [facility] requiring further screening, evaluation, or treatment in the ED at this time prior to discharge.  MDM Reviewed: previous chart, nursing note and vitals           Nicoletta Dress. Colon Branch, MD 03/30/12 [phone]

## 2012-03-30 NOTE — ED Notes (Signed)
Gave patient urinal as requested.  

## 2012-03-30 NOTE — ED Notes (Signed)
Patient complaining of bilateral leg and knee pain, also back pain x 2 days. Denies injury. 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

## 2012-03-30 NOTE — Telephone Encounter (Signed)
I called the [facility] yesterday and gave verbal order for Flexeril 5 TID.  You can disregard the fax.  Thanks, Larita Fife.

## 2012-04-01 ENCOUNTER — Emergency Department (HOSPITAL_COMMUNITY)
Admission: EM | Admit: 2012-04-01 | Discharge: 2012-04-01 | Disposition: A | Discharge status: Home / Self Care | Payer: Medicaid Other | Attending: Emergency Medicine | Admitting: Emergency Medicine

## 2012-04-01 ENCOUNTER — Encounter (HOSPITAL_COMMUNITY): Payer: Self-pay | Admitting: Emergency Medicine

## 2012-04-01 DIAGNOSIS — B1920 Unspecified viral hepatitis C without hepatic coma: Secondary | ICD-10-CM | POA: Insufficient documentation

## 2012-04-01 DIAGNOSIS — Z87891 Personal history of nicotine dependence: Secondary | ICD-10-CM | POA: Insufficient documentation

## 2012-04-01 DIAGNOSIS — IMO0002 Reserved for concepts with insufficient information to code with codable children: Secondary | ICD-10-CM | POA: Insufficient documentation

## 2012-04-01 DIAGNOSIS — G8929 Other chronic pain: Secondary | ICD-10-CM | POA: Insufficient documentation

## 2012-04-01 DIAGNOSIS — G20 Parkinson's disease: Secondary | ICD-10-CM | POA: Insufficient documentation

## 2012-04-01 DIAGNOSIS — I10 Essential (primary) hypertension: Secondary | ICD-10-CM | POA: Insufficient documentation

## 2012-04-01 DIAGNOSIS — G20A1 Parkinson's disease without dyskinesia, without mention of fluctuations: Secondary | ICD-10-CM | POA: Insufficient documentation

## 2012-04-01 MED ORDER — OXYCODONE-ACETAMINOPHEN 5-325 MG PO TABS
1.0000 | ORAL_TABLET | Freq: Once | ORAL | Status: AC
  Administered 2012-04-01: 1 via ORAL
  Filled 2012-04-01: qty 1

## 2012-04-01 MED ORDER — OXYCODONE-ACETAMINOPHEN 5-325 MG PO TABS: 1.0000 | ORAL_TABLET | ORAL | Status: DC | PRN

## 2012-04-01 NOTE — ED Notes (Signed)
Per EMS pt here for back pain. Pt from home and will be moving to [facility]. Pt has chronic back pain and is out of pain meds. Vitals normal. Pt in no acute distress.

## 2012-04-01 NOTE — ED Provider Notes (Signed)
History     CSN: [id number]  Arrival date & time 04/01/12  1306   First MD Initiated Contact with Patient 04/01/12 1412      Chief Complaint  Patient presents with  . Back Pain    (Consider location/radiation/quality/duration/timing/severity/associated sxs/prior treatment) HPI Comments: Patient with chronic back pain and frequent visits to the ED presents for pain medication refill.  Patient has known DDD, hx necrotizing fasciitis with continued chronic pain of the left him.  Pt states his doctor stopped his narcotic pain medications when he went into a facility, but he has since left the facility and he has no pain medication except flexeril, ultram, and neurontin, which do not help his pain.  Reports his wheelchair broke 4 days ago and he has been using a walker to get around, and has been having increased back pain and muscle aches and pains.  Denies fevers, bowel or bladder retention or incontinence, change in chronic extremity weakness and numbness.  Denies new injury.  Per DEA database, patient's last narcotic precription was 02/21/12 for #30 percocet 7.5s.    Patient is a 56 y.o. male presenting with back pain. The history is provided by the patient and medical records.  Back Pain  Pertinent negatives include no fever.    Past Medical History  Diagnosis Date  . DDD (degenerative disc disease)   . Necrotizing fasciitis   . Degenerative disc disease   . Hypertension   . Degenerative disc disease   . Hepatitis C   . Chronic pain   . DDD (degenerative disc disease)   . Parkinson disease   . Mental disorder   . Depression   . DDD (degenerative disc disease)     Past Surgical History  Procedure Date  . Hip surgery   . Appendectomy   . Ankle surgery     History reviewed. No pertinent family history.  History  Substance Use Topics  . Smoking status: Former Smoker -- 1.0 packs/day for 36 years    Quit date: 07/27/2011  . Smokeless tobacco: Former Neurosurgeon    Types: Chew    . Alcohol Use: Yes      Review of Systems  Constitutional: Negative for fever and chills.  Musculoskeletal: Positive for back pain and arthralgias.  Skin: Negative for rash and wound.    Allergies  No known allergies  Home Medications   Current Outpatient Rx  Name Route Sig Dispense Refill  . CYCLOBENZAPRINE HCL 5 MG PO TABS Oral Take 1 tablet (5 mg total) by mouth 3 (three) times daily as needed for muscle spasms. 30 tablet 0  . GABAPENTIN 300 MG PO CAPS Oral Take 1 capsule (300 mg total) by mouth 3 (three) times daily. 30 capsule 0  . METOPROLOL TARTRATE 25 MG PO TABS Oral Take 1 tablet (25 mg total) by mouth 2 (two) times daily. 62 tablet 11  . TAMSULOSIN HCL 0.4 MG PO CAPS Oral Take 2 capsules (0.8 mg total) by mouth at bedtime. 62 capsule 11  . TRAMADOL HCL 50 MG PO TABS Oral Take 50 mg by mouth every 6 (six) hours as needed.      BP 122/83  Pulse 64  Temp 98.2 F (36.8 C) (Oral)  Resp 14  SpO2 100%  Physical Exam  Nursing note and vitals reviewed. Constitutional: He appears well-developed and well-nourished. No distress.  HENT:  Head: Normocephalic and atraumatic.  Neck: Neck supple.  Pulmonary/Chest: Effort normal.  Musculoskeletal:       Cervical  back: He exhibits no bony tenderness.       Thoracic back: He exhibits tenderness. He exhibits no bony tenderness.       Lumbar back: He exhibits no bony tenderness.       Back:       Lower extremities:  Motor intact, strength equal bilaterally, sensation intact decreased on left (chronic), distal pulses intact.     Neurological: He is alert.  Skin: He is not diaphoretic.    ED Course  Procedures (including critical care time)  Labs Reviewed - No data to display No results found.   1. Chronic back pain       MDM  Pt with chronic back pain, mild increase in right paraspinal soreness as he is walking with walker instead of wheelchair recently, has been taken off of his narcotic pain medications by PCP  pending pain clinic placement, presents for pain management.  No red flags for back pain.  Pt checked on DEA database.  Pt given percocet here and prescription for percocet #20.  PCP follow up appointment 7/31. Pt given return precautions.  Pt verbalizes understanding and agrees with plan.            [place], [place] 04/01/12 [phone]

## 2012-04-01 NOTE — ED Notes (Signed)
[facility] called to transport pt home 

## 2012-04-03 NOTE — ED Provider Notes (Signed)
Medical screening examination/treatment/procedure(s) were performed by non-physician practitioner and as supervising physician I was immediately available for consultation/collaboration.   Kyllian Clingerman, MD 04/03/12 0710 

## 2012-04-04 ENCOUNTER — Emergency Department (HOSPITAL_COMMUNITY)
Admission: EM | Admit: 2012-04-04 | Discharge: 2012-04-05 | Disposition: A | Discharge status: Home / Self Care | Payer: Medicaid Other | Attending: Emergency Medicine | Admitting: Emergency Medicine

## 2012-04-04 ENCOUNTER — Telehealth (HOSPITAL_COMMUNITY): Payer: Self-pay | Admitting: Family Medicine

## 2012-04-04 DIAGNOSIS — I10 Essential (primary) hypertension: Secondary | ICD-10-CM | POA: Insufficient documentation

## 2012-04-04 DIAGNOSIS — G20 Parkinson's disease: Secondary | ICD-10-CM | POA: Insufficient documentation

## 2012-04-04 DIAGNOSIS — IMO0002 Reserved for concepts with insufficient information to code with codable children: Secondary | ICD-10-CM | POA: Insufficient documentation

## 2012-04-04 DIAGNOSIS — Z87891 Personal history of nicotine dependence: Secondary | ICD-10-CM | POA: Insufficient documentation

## 2012-04-04 DIAGNOSIS — G8929 Other chronic pain: Secondary | ICD-10-CM | POA: Insufficient documentation

## 2012-04-04 DIAGNOSIS — B1920 Unspecified viral hepatitis C without hepatic coma: Secondary | ICD-10-CM | POA: Insufficient documentation

## 2012-04-04 DIAGNOSIS — F1010 Alcohol abuse, uncomplicated: Secondary | ICD-10-CM | POA: Insufficient documentation

## 2012-04-04 DIAGNOSIS — G20A1 Parkinson's disease without dyskinesia, without mention of fluctuations: Secondary | ICD-10-CM | POA: Insufficient documentation

## 2012-04-04 LAB — CBC
HCT: 46.7 % (ref 39.0–52.0)
Hemoglobin: 16.7 g/dL (ref 13.0–17.0)
MCH: 31.7 pg (ref 26.0–34.0)
MCHC: 35.8 g/dL (ref 30.0–36.0)
MCV: 88.8 fL (ref 78.0–100.0)

## 2012-04-04 LAB — ETHANOL: Alcohol, Ethyl (B): 183 mg/dL — ABNORMAL HIGH (ref 0–11)

## 2012-04-04 LAB — COMPREHENSIVE METABOLIC PANEL
BUN: 12 mg/dL (ref 6–23)
CO2: 18 mEq/L — ABNORMAL LOW (ref 19–32)
Calcium: 9 mg/dL (ref 8.4–10.5)
GFR calc Af Amer: >90 mL/min (ref >90)
GFR calc non Af Amer: >90 mL/min (ref >90)
Glucose, Bld: 96 mg/dL (ref 70–99)
Total Protein: 7.3 g/dL (ref 6.0–8.3)

## 2012-04-04 LAB — RAPID URINE DRUG SCREEN, HOSP PERFORMED
Benzodiazepines: NOT DETECTED
Cocaine: NOT DETECTED

## 2012-04-04 NOTE — Telephone Encounter (Signed)
Thanks, [name].  The Tramadol is for PRN pain.  I am no longer prescribing Oxycodone anymore, but I will give a script for Tramadol if he is out.  I will be in clinic all day tomorrow if you need me and I will complete the FL2 then.  Thanks for your help.

## 2012-04-04 NOTE — ED Notes (Signed)
Pt has been placed in triage RM 1 and is yelling out.  He has urinated in the sink.

## 2012-04-04 NOTE — Telephone Encounter (Signed)
Message copied by Barnabas Lister on Tue Apr 04, 2012  1:37 PM ------      Message from: Theresia Bough      Created: Tue Apr 04, 2012  1:31 PM      Regarding: FL2 for [facility],            The facility needs to know what theTramadol is being prescribed for since its PRN. Also, they will need a script for the oxycodone since it's a narcotic. You can put the [facility] and script in my box. The facility administrator will pick up both this week.            Thanks! [name]
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
## 2012-04-05 ENCOUNTER — Telehealth: Payer: Self-pay | Admitting: Clinical

## 2012-04-05 ENCOUNTER — Ambulatory Visit: Payer: Medicaid Other | Admitting: Family Medicine

## 2012-04-05 NOTE — ED Notes (Signed)
Waiting on [facility] to transport patient back home 

## 2012-04-05 NOTE — ED Notes (Signed)
Pt was complaining about knee pain and commented  "I need to medicate myself with anything I can get my hands on"

## 2012-04-05 NOTE — Telephone Encounter (Signed)
error 
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
## 2012-04-06 ENCOUNTER — Telehealth: Payer: Self-pay | Admitting: Clinical

## 2012-04-06 NOTE — Telephone Encounter (Signed)
Clinical Child psychotherapist (CSW) received a message from [employer] with [employer] who informed CSW that someone from their facility would pick up pt's FL2 today. The FL2 is located in CSW box at [facility]. Pt will be admitted into the facility tomorrow.  Theresia Bough, MSW, Theresia Majors [phone]

## 2012-04-11 ENCOUNTER — Emergency Department (HOSPITAL_COMMUNITY)
Admission: EM | Admit: 2012-04-11 | Discharge: 2012-04-11 | Disposition: A | Discharge status: Home Health | Payer: Medicaid Other | Attending: Emergency Medicine | Admitting: Emergency Medicine

## 2012-04-11 ENCOUNTER — Encounter (HOSPITAL_COMMUNITY): Payer: Self-pay

## 2012-04-11 DIAGNOSIS — IMO0002 Reserved for concepts with insufficient information to code with codable children: Secondary | ICD-10-CM | POA: Insufficient documentation

## 2012-04-11 DIAGNOSIS — G20 Parkinson's disease: Secondary | ICD-10-CM | POA: Insufficient documentation

## 2012-04-11 DIAGNOSIS — Z87891 Personal history of nicotine dependence: Secondary | ICD-10-CM | POA: Insufficient documentation

## 2012-04-11 DIAGNOSIS — G8929 Other chronic pain: Secondary | ICD-10-CM | POA: Insufficient documentation

## 2012-04-11 DIAGNOSIS — G20A1 Parkinson's disease without dyskinesia, without mention of fluctuations: Secondary | ICD-10-CM | POA: Insufficient documentation

## 2012-04-11 DIAGNOSIS — I10 Essential (primary) hypertension: Secondary | ICD-10-CM | POA: Insufficient documentation

## 2012-04-11 DIAGNOSIS — B1920 Unspecified viral hepatitis C without hepatic coma: Secondary | ICD-10-CM | POA: Insufficient documentation

## 2012-04-11 DIAGNOSIS — L259 Unspecified contact dermatitis, unspecified cause: Secondary | ICD-10-CM | POA: Insufficient documentation

## 2012-04-11 DIAGNOSIS — L309 Dermatitis, unspecified: Secondary | ICD-10-CM

## 2012-04-11 MED ORDER — DIPHENHYDRAMINE HCL 25 MG PO CAPS
25.0000 mg | ORAL_CAPSULE | Freq: Once | ORAL | Status: AC
  Administered 2012-04-11: 25 mg via ORAL
  Filled 2012-04-11: qty 1

## 2012-04-11 MED ORDER — PREDNISONE 20 MG PO TABS
60.0000 mg | ORAL_TABLET | Freq: Once | ORAL | Status: AC
  Administered 2012-04-11: 60 mg via ORAL
  Filled 2012-04-11: qty 3

## 2012-04-11 MED ORDER — PREDNISONE 10 MG PO TABS: 40.0000 mg | ORAL_TABLET | Freq: Every day | ORAL | Status: DC

## 2012-04-11 MED ORDER — DIPHENHYDRAMINE HCL 25 MG PO TABS: 25.0000 mg | ORAL_TABLET | Freq: Four times a day (QID) | ORAL | Status: DC

## 2012-04-11 NOTE — ED Notes (Signed)
Pt has rash noted by patient for 2 days, used a new mattress last night.
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
## 2012-04-18 ENCOUNTER — Telehealth: Payer: Self-pay | Admitting: Family Medicine

## 2012-04-18 NOTE — Telephone Encounter (Signed)
Patient is calling from [facility] where he now lives.  He said that they have his pain management schedule all wrong.  He says they give it to him in the morning and then he doesn't get anymore for the rest of the day.  He won't get it again until the next morning.  He would like Dr. Tye Savoy to fix this.

## 2012-04-19 NOTE — Telephone Encounter (Signed)
Please inform patient that his FL-2 form stated that Tramadol can be given every 6 hours as needed for pain.  Because pain medications are written AS NEEDED, patient has to ask for them when he needs them.  They are not scheduled.  Thanks, Archie Patten.  If he still has an issue with this, please have [facility] call me.

## 2012-04-20 NOTE — Telephone Encounter (Signed)
Spoke with Lupita Leash @ [facility] and read her the information that Dr.de Lawson Radar forwarded and told her that if needed the director can call and speak with her. She understood and agreed.Loralee Pacas [place]

## 2012-04-28 ENCOUNTER — Telehealth: Payer: Self-pay | Admitting: Family Medicine

## 2012-04-28 NOTE — Telephone Encounter (Signed)
Is at [facility] and the doctor there is asking for the pain meds that he has been on in the past  - needs to speak with nurse about this.  pls ask for [employer]

## 2012-05-01 NOTE — Telephone Encounter (Signed)
Patient is going to speak with doctor that requested these and will sen over a ROI

## 2012-05-18 ENCOUNTER — Encounter (HOSPITAL_COMMUNITY): Payer: Self-pay | Admitting: Emergency Medicine

## 2012-05-18 ENCOUNTER — Emergency Department (HOSPITAL_COMMUNITY)
Admission: EM | Admit: 2012-05-18 | Discharge: 2012-05-18 | Disposition: A | Discharge status: Skilled Nursing Facility | Payer: Medicaid Other | Attending: Emergency Medicine | Admitting: Emergency Medicine

## 2012-05-18 ENCOUNTER — Emergency Department (HOSPITAL_COMMUNITY): Payer: Medicaid Other

## 2012-05-18 DIAGNOSIS — IMO0002 Reserved for concepts with insufficient information to code with codable children: Secondary | ICD-10-CM | POA: Insufficient documentation

## 2012-05-18 DIAGNOSIS — Z87891 Personal history of nicotine dependence: Secondary | ICD-10-CM | POA: Insufficient documentation

## 2012-05-18 DIAGNOSIS — I10 Essential (primary) hypertension: Secondary | ICD-10-CM | POA: Insufficient documentation

## 2012-05-18 DIAGNOSIS — M25519 Pain in unspecified shoulder: Secondary | ICD-10-CM | POA: Insufficient documentation

## 2012-05-18 DIAGNOSIS — G20A1 Parkinson's disease without dyskinesia, without mention of fluctuations: Secondary | ICD-10-CM | POA: Insufficient documentation

## 2012-05-18 DIAGNOSIS — Z79899 Other long term (current) drug therapy: Secondary | ICD-10-CM | POA: Insufficient documentation

## 2012-05-18 DIAGNOSIS — G20 Parkinson's disease: Secondary | ICD-10-CM | POA: Insufficient documentation

## 2012-05-18 DIAGNOSIS — M25512 Pain in left shoulder: Secondary | ICD-10-CM

## 2012-05-18 DIAGNOSIS — G8929 Other chronic pain: Secondary | ICD-10-CM | POA: Insufficient documentation

## 2012-05-18 MED ORDER — HYDROCODONE-ACETAMINOPHEN 5-325 MG PO TABS
2.0000 | ORAL_TABLET | Freq: Once | ORAL | Status: AC
  Administered 2012-05-18: 2 via ORAL
  Filled 2012-05-18: qty 2

## 2012-05-18 MED ORDER — OXYCODONE-ACETAMINOPHEN 5-325 MG PO TABS
2.0000 | ORAL_TABLET | Freq: Once | ORAL | Status: AC
  Administered 2012-05-18: 2 via ORAL
  Filled 2012-05-18: qty 2

## 2012-05-18 MED ORDER — OXYCODONE-ACETAMINOPHEN 5-325 MG PO TABS: 2.0000 | ORAL_TABLET | ORAL | Status: AC | PRN

## 2012-05-18 NOTE — ED Provider Notes (Signed)
History  This chart was scribed for Jeffrey Skene, MD by [name]. This patient was seen in room TR10C/TR10C and the patient's care was started at 14:58.  CSN: [id number]  Arrival date & time 05/18/12  1430   First MD Initiated Contact with Patient 05/18/12 1458      Chief Complaint  Patient presents with  . Shoulder Pain     The history is provided by the patient. No language interpreter was used.    Jeffrey [name] is a 56 y.o. male who presents to the Emergency Department from [facility] complaining of 2 days gradual onset, gradually worsening, constant L shoulder pain described as sharp attributed to a fall from wheelchair. He lists numbness and tingling in his left arm as associated symptoms. He states that the pain severity is 2 out of 10 currently and reports that he can't sleep on his left side due to pressure making the pain worse. He denies taking OTC medications at home to improve symptoms. He denies CP, SOB, nausea and emesis as associated symptoms. Pt has a h/o mental disorder, DDD, and chronic pain.    Pt told EMS he was off his pain medications at [facility]; however, pain medications are still listed on his medication list from the facility.   Past Medical History  Diagnosis Date  . DDD (degenerative disc disease)   . Necrotizing fasciitis   . Degenerative disc disease   . Hypertension   . Degenerative disc disease   . Hepatitis C   . Chronic pain   . DDD (degenerative disc disease)   . Parkinson disease   . Mental disorder   . Depression   . DDD (degenerative disc disease)     Past Surgical History  Procedure Date  . Hip surgery   . Appendectomy   . Ankle surgery     No family history on file.  History  Substance Use Topics  . Smoking status: Former Smoker -- 1.0 packs/day for 36 years    Quit date: 07/27/2011  . Smokeless tobacco: Former Neurosurgeon    Types: Chew  . Alcohol Use: Yes      Review of Systems  REVIEW OF SYSTEMS:   1.)  CONSTITUTIONAL: No fever, chills or systemic signs of infection. No recent, unexplained weight changes.   2.) HEENT: No facial pain, sinus congestion or rhinorrhea is reported. Patient is denying any acute visual or hearing deficits. No sore throat or difficulty swallowing.  3.) NECK: No swelling or masses are reported.   4.) PULMONARY: No cough sputum production or shortness of breath was reported.   5.) CARDIAC: No palpitations, chest pain or pressure.   6.) ABDOMINAL: Denies abdominal pain, nausea, vomiting or diarrhea. No Hematochezia or melena.  7.) GENITOURINARY: No burning with urination or frequency. No discharge.  8.) BACK: Denying any flank or CVA tenderness. No specific thoracic or lumbar pain.   9.) EXTREMITIES: L-shoulder pain. Denying any extremity edema pitting or rash.   10.) NEUROLOGIC: Denying any focal or lateralizing neurologic impairments.     11.) SKIN: No rashes, itching   Allergies  Review of patient's allergies indicates no known allergies.  Home Medications   Current Outpatient Rx  Name Route Sig Dispense Refill  . CYCLOBENZAPRINE HCL 5 MG PO TABS Oral Take 5 mg by mouth 3 (three) times daily as needed. For muscle spasms.    Marland Kitchen DIPHENHYDRAMINE HCL 25 MG PO CAPS Oral Take 25 mg by mouth every 6 (six) hours as  needed. For itching    . GABAPENTIN 300 MG PO CAPS Oral Take 300 mg by mouth 3 (three) times daily.     Marland Kitchen HYDROXYZINE HCL 25 MG PO TABS Oral Take 25 mg by mouth every 6 (six) hours as needed. For anxiety/agitation    . METOPROLOL TARTRATE 25 MG PO TABS Oral Take 25 mg by mouth 2 (two) times daily.    Marland Kitchen NAPROXEN SODIUM 220 MG PO TABS Oral Take 660 mg by mouth 2 (two) times daily with a meal.    . OXYCODONE HCL ER 10 MG PO TB12 Oral Take 10 mg by mouth every 12 (twelve) hours.    . TAMSULOSIN HCL 0.4 MG PO CAPS Oral Take 0.8 mg by mouth at bedtime.    . TRAMADOL HCL 50 MG PO TABS Oral Take 50 mg by mouth every 6 (six) hours as needed. For pain    .  DIPHENHYDRAMINE HCL 25 MG PO TABS Oral Take 1 tablet (25 mg total) by mouth every 6 (six) hours. 20 tablet 0    Triage Vitals: BP 109/69  Pulse 78  Temp 97.9 F (36.6 C) (Oral)  Resp 20  SpO2 96%  Physical Exam  Nursing notes reviewed.  Electronic medical record reviewed. VITAL SIGNS:   Filed Vitals:   05/18/12 1430  BP: 109/69  Pulse: 78  Temp: 97.9 F (36.6 C)  TempSrc: Oral  Resp: 20  SpO2: 96%   CONSTITUTIONAL: Awake, oriented, appears non-toxic HENT: Atraumatic, normocephalic, oral mucosa pink and moist, airway patent. Nares patent without drainage. External ears normal. EYES: Conjunctiva clear, EOMI, PERRLA NECK: Trachea midline, non-tender, supple CARDIOVASCULAR: Normal heart rate, Normal rhythm, No murmurs, rubs, gallops PULMONARY/CHEST: Clear to auscultation, no rhonchi, wheezes, or rales. Symmetrical breath sounds. Non-tender. ABDOMINAL: Non-distended, soft, non-tender - no rebound or guarding.  BS normal. NEUROLOGIC: Non-focal, moving all four extremities, no gross sensory or motor deficits. EXTREMITIES: Left shoulder is mildly tender to palpation the posterior aspect when palpated between the humeral head and acromion, patient has mild weakness when using the rotator cuff muscles, however not sure if this is due to pain or is intentional. Distal pulses are intact, sensations intact distally. No clubbing or edema. SKIN: Warm, Dry, No erythema, No rash  ED Course  Procedures (including critical care time)  DIAGNOSTIC STUDIES: Oxygen Saturation is 96% on room air, adequate by my interpretation.    COORDINATION OF CARE: 3:09PM -Discussed treatment plan which includes XR, and pain medication with pt at bedside and pt agreed to plan.  4:54PM-Pt rechecked and still c/o pain. Informed pt of radiology report. Reports that he has been taking 10 mg oxycodone and percocet for break through pain. Discussed treatment plan which includes percocet  with pt at bedside and pt  agreed to plan.   Labs Reviewed - No data to display Dg Shoulder Left  05/18/2012  *RADIOLOGY REPORT*  Clinical Data: 56 year old male status post injury with left shoulder pain.  LEFT SHOULDER - 2+ VIEW  Comparison: None.  Findings: Bone mineralization is within normal limits for age.  No glenohumeral joint dislocation.  Proximal left humerus appears intact.  Left clavicle and scapula appear intact.  Visualized right ribs and lung parenchyma appear within normal limits.  IMPRESSION: No acute osseous abnormality identified about the left shoulder.   Original Report Authenticated By: Harley Hallmark, M.D.      1. Shoulder pain, left       MDM  Jaman Aro is a 56 y.o. male  presenting with left shoulder pain sustained while using his wheelchair. He said he did this previously. He's been on chronic pain medicine in the past.  No injuries found on x-ray. Patient's shoulder is tender, some mild weakness likely due to pain, he may have stretched out his rotator cuff versus tearing it - I suspicion is that this is a soft tissue injury, do not effectively be treated with narcotics because of his high tolerance. He'll see orthopedics next week they'll give him some pain medicine to last through the weekend. Told him you might any more pain medicine in the emergency department.  Do not suspect any deep tissue infection such as fasciitis.   The chart was dictated and assembled with the help of a scribe, it has been reviewed me, Jeffrey [name], M.D.  05/18/2012 7:12 PM Vision has become demanding and abusive because he would like to go outside and smoke a cigarette, security has been called.  Situation temporized.        Jeffrey Skene, MD 05/18/12 1913

## 2012-05-18 NOTE — ED Notes (Signed)
Per EMS: Pt from [facility]. States he injured his L shoulder in a near fall from his wheelchair 2 days ago. Told EMS he has been off his pain medications at his facility. However, pain medications are still listed on his medication list from the facility.

## 2012-05-21 ENCOUNTER — Encounter (HOSPITAL_COMMUNITY): Payer: Self-pay | Admitting: *Deleted

## 2012-05-21 ENCOUNTER — Emergency Department (HOSPITAL_COMMUNITY)
Admission: EM | Admit: 2012-05-21 | Discharge: 2012-05-22 | Disposition: A | Discharge status: Home / Self Care | Payer: Medicaid Other | Attending: Emergency Medicine | Admitting: Emergency Medicine

## 2012-05-21 DIAGNOSIS — B1920 Unspecified viral hepatitis C without hepatic coma: Secondary | ICD-10-CM | POA: Insufficient documentation

## 2012-05-21 DIAGNOSIS — I10 Essential (primary) hypertension: Secondary | ICD-10-CM | POA: Insufficient documentation

## 2012-05-21 DIAGNOSIS — G8929 Other chronic pain: Secondary | ICD-10-CM | POA: Insufficient documentation

## 2012-05-21 DIAGNOSIS — M25512 Pain in left shoulder: Secondary | ICD-10-CM

## 2012-05-21 DIAGNOSIS — IMO0002 Reserved for concepts with insufficient information to code with codable children: Secondary | ICD-10-CM | POA: Insufficient documentation

## 2012-05-21 DIAGNOSIS — Z87891 Personal history of nicotine dependence: Secondary | ICD-10-CM | POA: Insufficient documentation

## 2012-05-21 DIAGNOSIS — G20A1 Parkinson's disease without dyskinesia, without mention of fluctuations: Secondary | ICD-10-CM | POA: Insufficient documentation

## 2012-05-21 DIAGNOSIS — G20 Parkinson's disease: Secondary | ICD-10-CM | POA: Insufficient documentation

## 2012-05-21 DIAGNOSIS — M25519 Pain in unspecified shoulder: Secondary | ICD-10-CM | POA: Insufficient documentation

## 2012-05-21 MED ORDER — OXYCODONE-ACETAMINOPHEN 5-325 MG PO TABS: 2.0000 | ORAL_TABLET | ORAL | Status: AC | PRN

## 2012-05-21 NOTE — ED Notes (Addendum)
C/o L shoulder pain, onset Wednesday. Seen here at [facility] ED on Wednesday. D/c'd with ortho f/u. C/o pain worsening. Mentions dx'd with torn something in L shoulder. Here from [facility]. "Out of pain med". CMS intact. Decreased ROM L arm.

## 2012-05-21 NOTE — Progress Notes (Signed)
Orthopedic Tech Progress Note Patient Details:  Jeffrey [name] December 04, 1955 [id number]  Ortho Devices Type of Ortho Device: Sling immobilizer   Jeffrey [name] 05/21/2012, 11:48 PM

## 2012-05-21 NOTE — ED Provider Notes (Signed)
History     CSN: [id number]  Arrival date & time 05/21/12  2138   First MD Initiated Contact with Patient 05/21/12 2321      Chief Complaint  Patient presents with  . Shoulder Pain    (Consider location/radiation/quality/duration/timing/severity/associated sxs/prior treatment) HPI History provided by pt.   Pt comes from [facility].  Prsents with non-traumatic pain to left shoulder.  Per prior chart, pt seen for same 3 days ago, had a neg xray, soft tissue injury suspected, d/c'd home w/ short course of percocet and referral to ortho.  Pt returns today because he is out of pain medication and pain has increased in severity.  Attributes to pulling himself up ramp of his home in wheelchair.  Pain now radiates into right side of neck and down to his elbow.  Aggravated by laying flat and applying pressure.  No associated extremity weakness/paresthesias.  Denies chest pain and SOB.  Denies fever.   Past Medical History  Diagnosis Date  . DDD (degenerative disc disease)   . Necrotizing fasciitis   . Degenerative disc disease   . Hypertension   . Degenerative disc disease   . Hepatitis C   . Chronic pain   . DDD (degenerative disc disease)   . Parkinson disease   . Mental disorder   . Depression   . DDD (degenerative disc disease)     Past Surgical History  Procedure Date  . Hip surgery   . Appendectomy   . Ankle surgery     No family history on file.  History  Substance Use Topics  . Smoking status: Former Smoker -- 1.0 packs/day for 36 years    Quit date: 07/27/2011  . Smokeless tobacco: Former Neurosurgeon    Types: Chew  . Alcohol Use: Yes      Review of Systems  All other systems reviewed and are negative.    Allergies  Review of patient's allergies indicates no known allergies.  Home Medications   Current Outpatient Rx  Name Route Sig Dispense Refill  . CYCLOBENZAPRINE HCL 5 MG PO TABS Oral Take 5 mg by mouth 3 (three) times daily as needed. For muscle spasms.     Marland Kitchen DIPHENHYDRAMINE HCL 25 MG PO CAPS Oral Take 25 mg by mouth every 6 (six) hours as needed. For itching    . DIPHENHYDRAMINE HCL 25 MG PO TABS Oral Take 1 tablet (25 mg total) by mouth every 6 (six) hours. 20 tablet 0  . GABAPENTIN 300 MG PO CAPS Oral Take 300 mg by mouth 3 (three) times daily.     Marland Kitchen HYDROXYZINE HCL 25 MG PO TABS Oral Take 25 mg by mouth every 6 (six) hours as needed. For anxiety/agitation    . METOPROLOL TARTRATE 25 MG PO TABS Oral Take 25 mg by mouth 2 (two) times daily.    Marland Kitchen NAPROXEN SODIUM 220 MG PO TABS Oral Take 660 mg by mouth 2 (two) times daily with a meal.    . OXYCODONE HCL ER 10 MG PO TB12 Oral Take 10 mg by mouth every 12 (twelve) hours.    . OXYCODONE-ACETAMINOPHEN 5-325 MG PO TABS Oral Take 2 tablets by mouth every 4 (four) hours as needed for pain. 12 tablet 0  . OXYCODONE-ACETAMINOPHEN 5-325 MG PO TABS Oral Take 2 tablets by mouth every 4 (four) hours as needed for pain. 10 tablet 0  . TAMSULOSIN HCL 0.4 MG PO CAPS Oral Take 0.8 mg by mouth at bedtime.    Marland Kitchen  TRAMADOL HCL 50 MG PO TABS Oral Take 50 mg by mouth every 6 (six) hours as needed. For pain      BP 120/77  Pulse 77  Temp 98.1 F (36.7 C) (Oral)  Resp 18  SpO2 96%  Physical Exam  Nursing note and vitals reviewed. Constitutional: He is oriented to person, place, and time. He appears well-developed and well-nourished. No distress.  HENT:  Head: Normocephalic and atraumatic.  Eyes:       Normal appearance  Neck: Normal range of motion.  Cardiovascular: Normal rate and regular rhythm.   Pulmonary/Chest: Effort normal and breath sounds normal. No respiratory distress. He exhibits no tenderness.  Musculoskeletal: Normal range of motion.       Left shoulder w/out skin changes or deformity.  Tenderness posterior shoulder as well as left trap.  No tenderness cervical spine.  Full active ROM neck. Mild shoulder pain w/ passive ROM.  4/5 strength w/ shoulder flexion/extension/abduction.  Nml elbow and  wrist exam.  NV intact.    Neurological: He is alert and oriented to person, place, and time.  Skin: Skin is warm and dry. No rash noted.  Psychiatric: He has a normal mood and affect. His behavior is normal.    ED Course  Procedures (including critical care time)  Labs Reviewed - No data to display No results found.   1. Pain in left shoulder       MDM  56yo M [facility] with non-traumatic L shoulder pain.  No signs of joint infection and NV intact on exam.  Suspect rotator cuff injury secondary to overuse (pt wheelchair bound and pulls himself up ramp of his home with his arms).  He has already been referred to ortho.  Ortho tech placed in arm sling to remind him to rest his LUE.  Pt received one percocet in ED and prescribed 10 more.  Return precautions discussed.         Arie Sabina Antares, [place] 05/22/12 1436

## 2012-05-26 ENCOUNTER — Ambulatory Visit (INDEPENDENT_AMBULATORY_CARE_PROVIDER_SITE_OTHER): Payer: Medicaid Other | Admitting: Family Medicine

## 2012-05-26 ENCOUNTER — Encounter: Payer: Self-pay | Admitting: Family Medicine

## 2012-05-26 VITALS — BP 116/78 | HR 76 | Ht 76.0 in | Wt 218.0 lb

## 2012-05-26 DIAGNOSIS — M25512 Pain in left shoulder: Secondary | ICD-10-CM

## 2012-05-26 DIAGNOSIS — M25519 Pain in unspecified shoulder: Secondary | ICD-10-CM

## 2012-05-26 NOTE — Patient Instructions (Addendum)
Thank you for coming in today. Keep that shoulder active Try to avoid heavy over shoulder activity.  Please come back in 1 month or so.  Dont wear the sling unless you need it.

## 2012-05-26 NOTE — Assessment & Plan Note (Signed)
Left shoulder pain likely to be either subtle rotator cuff tear or more likely subacromial bursitis.  Patient certainly has degenerative appearing supraspinatus with multiple calcifications within the body of the tendon.   Plan:  Subacromial corticosteroid injection, range of motion exercises. Followup in one month avoid heavy lifting possible

## 2012-05-26 NOTE — Progress Notes (Signed)
Jeffrey [name] is a 56 y.o. male who presents to [facility] today for left shoulder pain.  Patient is mostly confined to a wheelchair secondary to a severe episode of necrotizing fasciitis to his left leg several years ago.    Recently he was trying to pull himself up wheelchair ramp when he felt shoulder pain.  He presented to the emergency room where he was suspected to have rotator cuff injury.  He was referred sports medicine for further evaluation.  Patient notes significant left shoulder pain especially the lateral aspect of the shoulder at night. Additionally his pain with overhead activity and with wheelchair locomotion.   He denies any significant radicular left arm pain weakness or numbness.     PMH reviewed. Significant for chronic pain syndrome and substance abuse including alcohol and cocaine.  No longer prescribed oxycodone her primary care provider. History  Substance Use Topics  . Smoking status: Current Every Day Smoker -- 1.0 packs/day for 36 years  . Smokeless tobacco: Former Neurosurgeon    Types: Chew  . Alcohol Use: Yes   currently living at an assisted living facility ROS as above otherwise neg   Exam:  BP 116/78  Pulse 76  Ht 6\' 4"  (1.93 m)  Wt 218 lb (98.884 kg)  BMI 26.54 kg/m2 Gen: Well NAD MSK: Left shoulder: Normal-appearing. Nontender throughout. Normal shoulder range of motion. Strength preserved to supraspinatus external rotation and internal rotation testing. Positive Hawkins and positive Neer test.  Musculoskeletal ultrasound: Biceps tendon: Well appearing on long and short views Subscapularis: A calcifications within the body of the tendon on long and short view mild increased neovessels long and short views Supraspinatus: Multiple calcifications and heterogenicity seen within the body of the tendon. No definitive tears seen. Increased neovessels.  Thickened subacromial bursa. Long and short views Infraspinatus: Relatively normal appearing on ultrasound examination  long and short views  A.c. Joint: Relatively normal appearing on ultrasound examination  05/18/2012  *RADIOLOGY REPORT*  Clinical Data: 56 year old male status post injury with left shoulder pain.  LEFT SHOULDER - 2+ VIEW  Comparison: None.  Findings: Bone mineralization is within normal limits for age.  No glenohumeral joint dislocation.  Proximal left humerus appears intact.  Left clavicle and scapula appear intact.  Visualized right ribs and lung parenchyma appear within normal limits.  IMPRESSION: No acute osseous abnormality identified about the left shoulder.   Original Report Authenticated By: Harley Hallmark, M.D.     Subacromial Injection:Left Shoulder  Consent obtained and time out performed.  Area cleaned with betadine.  40Mg  of depomedrol and 4ml of 1% lidocaine was injected into the subacromial bursa without complication or bleeding. Patient tolerated the procedure well.

## 2012-05-30 NOTE — Progress Notes (Signed)
[facility] Attending Note: I have seen and examined this patient. I have discussed this patient with the resident and reviewed the assessment and plan as documented above. I agree with the resident's findings and plan. Denny Levy

## 2012-06-23 ENCOUNTER — Ambulatory Visit: Payer: Medicaid Other | Admitting: Family Medicine

## 2012-06-25 ENCOUNTER — Emergency Department (HOSPITAL_COMMUNITY)
Admission: EM | Admit: 2012-06-25 | Discharge: 2012-06-25 | Disposition: A | Discharge status: Skilled Nursing Facility | Payer: Medicaid Other | Attending: Emergency Medicine | Admitting: Emergency Medicine

## 2012-06-25 ENCOUNTER — Encounter (HOSPITAL_COMMUNITY): Payer: Self-pay | Admitting: Emergency Medicine

## 2012-06-25 DIAGNOSIS — G8929 Other chronic pain: Secondary | ICD-10-CM

## 2012-06-25 DIAGNOSIS — M25559 Pain in unspecified hip: Secondary | ICD-10-CM | POA: Insufficient documentation

## 2012-06-25 DIAGNOSIS — IMO0002 Reserved for concepts with insufficient information to code with codable children: Secondary | ICD-10-CM | POA: Insufficient documentation

## 2012-06-25 DIAGNOSIS — G20A1 Parkinson's disease without dyskinesia, without mention of fluctuations: Secondary | ICD-10-CM | POA: Insufficient documentation

## 2012-06-25 DIAGNOSIS — Z79899 Other long term (current) drug therapy: Secondary | ICD-10-CM | POA: Insufficient documentation

## 2012-06-25 DIAGNOSIS — F17200 Nicotine dependence, unspecified, uncomplicated: Secondary | ICD-10-CM | POA: Insufficient documentation

## 2012-06-25 DIAGNOSIS — Z8619 Personal history of other infectious and parasitic diseases: Secondary | ICD-10-CM | POA: Insufficient documentation

## 2012-06-25 DIAGNOSIS — Z760 Encounter for issue of repeat prescription: Secondary | ICD-10-CM | POA: Insufficient documentation

## 2012-06-25 DIAGNOSIS — G20 Parkinson's disease: Secondary | ICD-10-CM | POA: Insufficient documentation

## 2012-06-25 DIAGNOSIS — I10 Essential (primary) hypertension: Secondary | ICD-10-CM | POA: Insufficient documentation

## 2012-06-25 DIAGNOSIS — M25519 Pain in unspecified shoulder: Secondary | ICD-10-CM | POA: Insufficient documentation

## 2012-06-25 MED ORDER — OXYCODONE-ACETAMINOPHEN 5-325 MG PO TABS: 2.0000 | ORAL_TABLET | Freq: Four times a day (QID) | ORAL | Status: DC | PRN

## 2012-06-25 MED ORDER — OXYCODONE-ACETAMINOPHEN 5-325 MG PO TABS
2.0000 | ORAL_TABLET | Freq: Once | ORAL | Status: AC
  Administered 2012-06-25: 2 via ORAL
  Filled 2012-06-25: qty 2

## 2012-06-25 NOTE — ED Notes (Signed)
Reports found out 7AM took last pain medication given rx is out.

## 2012-06-25 NOTE — ED Notes (Signed)
[id number]<AV> Expected date:06/25/12<BR> Expected time:12:18 PM<BR> Means of arrival:Ambulance<BR> Comments:<BR> Pain  Ran out of pain meds

## 2012-06-25 NOTE — ED Provider Notes (Signed)
History     CSN: [id number]  Arrival date & time 06/25/12  1220   First MD Initiated Contact with Patient 06/25/12 1237      Chief Complaint  Patient presents with  . Medication Refill    (Consider location/radiation/quality/duration/timing/severity/associated sxs/prior treatment) HPI This 56 year old male has chronic severe left hip pain chronic low back pain and has been on chronic narcotics in the past and ran out. He requests a narcotic prescription next day or 2 until he is narcotics for new from his Dr. He recently had some left shoulder pain but that is much better after injection from sports medicine. His left shoulder is now bothering him today. He is no fever confusion chest pain shortness breath abdominal pain or vomiting. He is no new weakness or numbness to his left leg his left leg is always weak and in severe pain from prior necrotizing fasciitis. He is wheelchair-bound at baseline. There is no new change in bowel or bladder function. There is no recent trauma. He is only here for narcotic refill. Past Medical History  Diagnosis Date  . DDD (degenerative disc disease)   . Necrotizing fasciitis   . Degenerative disc disease   . Hypertension   . Degenerative disc disease   . Hepatitis C   . Chronic pain   . DDD (degenerative disc disease)   . Parkinson disease   . Mental disorder   . Depression   . DDD (degenerative disc disease)     Past Surgical History  Procedure Date  . Hip surgery   . Appendectomy   . Ankle surgery     History reviewed. No pertinent family history.  History  Substance Use Topics  . Smoking status: Current Every Day Smoker -- 1.0 packs/day for 36 years  . Smokeless tobacco: Former Neurosurgeon    Types: Chew  . Alcohol Use: Yes      Review of Systems 10 Systems reviewed and are negative for acute change except as noted in the HPI. Allergies  Review of patient's allergies indicates no known allergies.  Home Medications   Current  Outpatient Rx  Name Route Sig Dispense Refill  . METOPROLOL TARTRATE 25 MG PO TABS Oral Take 25 mg by mouth 2 (two) times daily.    . OXYCODONE HCL 5 MG PO CAPS Oral Take 5 mg by mouth 3 (three) times daily as needed. pain    . OXYCODONE HCL ER 10 MG PO TB12 Oral Take 10 mg by mouth every 12 (twelve) hours.    . TAMSULOSIN HCL 0.4 MG PO CAPS Oral Take 0.8 mg by mouth at bedtime.    . OXYCODONE HCL 5 MG PO TABS Oral Take 1 tablet (5 mg total) by mouth every 4 (four) hours as needed for pain. 6 tablet 0    BP 116/82  Pulse 70  Temp 97.8 F (36.6 C) (Oral)  Resp 18  Ht 6\' 4"  (1.93 m)  Wt 225 lb (102.059 kg)  BMI 27.39 kg/m2  SpO2 98%  Physical Exam  Nursing note and vitals reviewed. Constitutional:       Awake, alert, nontoxic appearance.  HENT:  Head: Atraumatic.  Eyes: Right eye exhibits no discharge. Left eye exhibits no discharge.  Neck: Neck supple.  Cardiovascular: Normal rate and regular rhythm.   No murmur heard. Pulmonary/Chest: Effort normal and breath sounds normal. No respiratory distress. He has no wheezes. He has no rales. He exhibits no tenderness.  Abdominal: Soft. Bowel sounds are normal. He exhibits  no distension and no mass. There is no tenderness. There is no rebound and no guarding.  Musculoskeletal: He exhibits tenderness.       Baseline ROM, no obvious new focal weakness. Both arms and right leg currently nontender. Left leg is baseline tenderness to the hip and low back. His left foot has capillary refill less than 2 seconds with normal light touch intact dorsalis pedis pulse. Baseline limited movement of his left leg.  Neurological:       Mental status and motor strength appears baseline for patient and situation.  Skin: No rash noted.  Psychiatric: He has a normal mood and affect.    ED Course  Procedures (including critical care time)  Labs Reviewed - No data to display No results found.   1. Chronic left hip pain       MDM  Pt stable in ED  with no significant deterioration in condition.  Patient / Family / Caregiver informed of clinical course, understand medical decision-making process, and agree with plan.  I doubt any other EMC precluding discharge at this time including, but not necessarily limited to the following:acute Fx.        Hurman Horn, MD 06/30/12 2023

## 2012-06-25 NOTE — ED Notes (Signed)
Reports was given last oxycontin 20mg  po last night. Told this @ 7AM rx was out. Here for medication refill. Stated the [facility] MD is under medicating him.

## 2012-06-25 NOTE — ED Notes (Signed)
MD at bedside. 

## 2012-06-26 ENCOUNTER — Ambulatory Visit: Payer: Medicaid Other | Admitting: Family Medicine

## 2012-06-27 ENCOUNTER — Emergency Department (HOSPITAL_COMMUNITY)
Admission: EM | Admit: 2012-06-27 | Discharge: 2012-06-27 | Disposition: A | Discharge status: Home / Self Care | Payer: Medicaid Other | Attending: Emergency Medicine | Admitting: Emergency Medicine

## 2012-06-27 ENCOUNTER — Encounter (HOSPITAL_COMMUNITY): Payer: Self-pay | Admitting: Emergency Medicine

## 2012-06-27 DIAGNOSIS — Z79899 Other long term (current) drug therapy: Secondary | ICD-10-CM | POA: Insufficient documentation

## 2012-06-27 DIAGNOSIS — Z8739 Personal history of other diseases of the musculoskeletal system and connective tissue: Secondary | ICD-10-CM | POA: Insufficient documentation

## 2012-06-27 DIAGNOSIS — I10 Essential (primary) hypertension: Secondary | ICD-10-CM | POA: Insufficient documentation

## 2012-06-27 DIAGNOSIS — G20 Parkinson's disease: Secondary | ICD-10-CM | POA: Insufficient documentation

## 2012-06-27 DIAGNOSIS — M549 Dorsalgia, unspecified: Secondary | ICD-10-CM

## 2012-06-27 DIAGNOSIS — G8929 Other chronic pain: Secondary | ICD-10-CM | POA: Insufficient documentation

## 2012-06-27 DIAGNOSIS — G20A1 Parkinson's disease without dyskinesia, without mention of fluctuations: Secondary | ICD-10-CM | POA: Insufficient documentation

## 2012-06-27 DIAGNOSIS — IMO0002 Reserved for concepts with insufficient information to code with codable children: Secondary | ICD-10-CM | POA: Insufficient documentation

## 2012-06-27 DIAGNOSIS — F17200 Nicotine dependence, unspecified, uncomplicated: Secondary | ICD-10-CM | POA: Insufficient documentation

## 2012-06-27 DIAGNOSIS — Z8619 Personal history of other infectious and parasitic diseases: Secondary | ICD-10-CM | POA: Insufficient documentation

## 2012-06-27 DIAGNOSIS — IMO0001 Reserved for inherently not codable concepts without codable children: Secondary | ICD-10-CM | POA: Insufficient documentation

## 2012-06-27 MED ORDER — OXYCODONE HCL 5 MG PO TABS: 5.0000 mg | ORAL_TABLET | ORAL | Status: DC | PRN

## 2012-06-27 MED ORDER — HYDROMORPHONE HCL PF 1 MG/ML IJ SOLN
1.0000 mg | Freq: Once | INTRAMUSCULAR | Status: AC
  Administered 2012-06-27: 1 mg via INTRAMUSCULAR
  Filled 2012-06-27: qty 1

## 2012-06-27 NOTE — ED Notes (Signed)
[facility] contacted 

## 2012-06-27 NOTE — ED Notes (Signed)
Per EMS pt is from [facility] and is c/o generalized pain  Pt states facility has been out of his pain medication for the past two days  Pt is c/o lower back pain, hip and lower leg pain

## 2012-06-27 NOTE — ED Provider Notes (Signed)
Medical screening examination/treatment/procedure(s) were performed by non-physician practitioner and as supervising physician I was immediately available for consultation/collaboration.  Zion Lint, MD 06/27/12 0645 

## 2012-06-27 NOTE — ED Notes (Signed)
Pt reports running out of his primary pain medication since Saturday. Pt is searching for a primary care physician.

## 2012-06-27 NOTE — ED Provider Notes (Signed)
History     CSN: [id number]  Arrival date & time 06/27/12  0107   First MD Initiated Contact with Patient 06/27/12 0231      Chief Complaint  Patient presents with  . Back Pain    (Consider location/radiation/quality/duration/timing/severity/associated sxs/prior treatment) HPI Comments: Patient with a history of chronic back pain.  States, that his assisted living has been out of his pain medication for 2 days.  He was seen here last night with the same "story" he states he was promised that the facility.  Would get his medication by the evening, when the evening shift arrived, the medicine did not  Patient is a 56 y.o. male presenting with back pain. The history is provided by the patient.  Back Pain  This is a chronic problem. The problem occurs constantly. The problem has not changed since onset.The pain is associated with no known injury. Pertinent negatives include no fever and no weakness.    Past Medical History  Diagnosis Date  . DDD (degenerative disc disease)   . Necrotizing fasciitis   . Degenerative disc disease   . Hypertension   . Degenerative disc disease   . Hepatitis C   . Chronic pain   . DDD (degenerative disc disease)   . Parkinson disease   . Mental disorder   . Depression   . DDD (degenerative disc disease)     Past Surgical History  Procedure Date  . Hip surgery   . Appendectomy   . Ankle surgery     History reviewed. No pertinent family history.  History  Substance Use Topics  . Smoking status: Current Every Day Smoker -- 1.0 packs/day for 36 years  . Smokeless tobacco: Former Neurosurgeon    Types: Chew  . Alcohol Use: Yes      Review of Systems  Constitutional: Negative for fever.  Gastrointestinal: Negative for nausea and vomiting.  Musculoskeletal: Positive for myalgias and back pain.  Skin: Negative for wound.  Neurological: Negative for dizziness and weakness.    Allergies  Review of patient's allergies indicates no known  allergies.  Home Medications   Current Outpatient Rx  Name Route Sig Dispense Refill  . METOPROLOL TARTRATE 25 MG PO TABS Oral Take 25 mg by mouth 2 (two) times daily.    . OXYCODONE HCL 5 MG PO CAPS Oral Take 5 mg by mouth 3 (three) times daily as needed. pain    . OXYCODONE HCL ER 10 MG PO TB12 Oral Take 10 mg by mouth every 12 (twelve) hours.    . TAMSULOSIN HCL 0.4 MG PO CAPS Oral Take 0.8 mg by mouth at bedtime.    . OXYCODONE HCL 5 MG PO TABS Oral Take 1 tablet (5 mg total) by mouth every 4 (four) hours as needed for pain. 6 tablet 0    BP 133/89  Pulse 70  Temp 98.5 F (36.9 C) (Oral)  Resp 20  SpO2 98%  Physical Exam  Constitutional: He appears well-developed and well-nourished.  HENT:  Head: Normocephalic.  Eyes: Pupils are equal, round, and reactive to light.  Neck: Normal range of motion.  Cardiovascular: Normal rate.   Musculoskeletal: Normal range of motion.  Skin: Skin is warm. No rash noted. No pallor.    ED Course  Procedures (including critical care time)  Labs Reviewed - No data to display No results found.   1. Chronic back pain       MDM   Patient will be given a shot of  Dilaudid to control his pain.  He is requesting a prescription for 1 days worth of medication to assure that he does not have to come back tomorrow to to lack of medication.  At this facility        Arman Filter, NP 06/27/12 0341  Arman Filter, NP 06/27/12 0341  Arman Filter, NP 06/27/12 0341  Arman Filter, NP 06/27/12 0342  Arman Filter, NP 06/27/12 [phone]

## 2012-08-10 ENCOUNTER — Emergency Department (HOSPITAL_COMMUNITY)
Admission: EM | Admit: 2012-08-10 | Discharge: 2012-08-11 | Disposition: A | Discharge status: Home / Self Care | Payer: Medicaid Other | Attending: Emergency Medicine | Admitting: Emergency Medicine

## 2012-08-10 ENCOUNTER — Encounter (HOSPITAL_COMMUNITY): Payer: Self-pay | Admitting: *Deleted

## 2012-08-10 DIAGNOSIS — M549 Dorsalgia, unspecified: Secondary | ICD-10-CM | POA: Insufficient documentation

## 2012-08-10 DIAGNOSIS — M79609 Pain in unspecified limb: Secondary | ICD-10-CM | POA: Insufficient documentation

## 2012-08-10 DIAGNOSIS — K759 Inflammatory liver disease, unspecified: Secondary | ICD-10-CM | POA: Insufficient documentation

## 2012-08-10 DIAGNOSIS — I10 Essential (primary) hypertension: Secondary | ICD-10-CM | POA: Insufficient documentation

## 2012-08-10 DIAGNOSIS — M726 Necrotizing fasciitis: Secondary | ICD-10-CM | POA: Insufficient documentation

## 2012-08-10 DIAGNOSIS — M25559 Pain in unspecified hip: Secondary | ICD-10-CM | POA: Insufficient documentation

## 2012-08-10 DIAGNOSIS — Z79899 Other long term (current) drug therapy: Secondary | ICD-10-CM | POA: Insufficient documentation

## 2012-08-10 DIAGNOSIS — R109 Unspecified abdominal pain: Secondary | ICD-10-CM | POA: Insufficient documentation

## 2012-08-10 DIAGNOSIS — F3289 Other specified depressive episodes: Secondary | ICD-10-CM | POA: Insufficient documentation

## 2012-08-10 DIAGNOSIS — F329 Major depressive disorder, single episode, unspecified: Secondary | ICD-10-CM | POA: Insufficient documentation

## 2012-08-10 DIAGNOSIS — F17200 Nicotine dependence, unspecified, uncomplicated: Secondary | ICD-10-CM | POA: Insufficient documentation

## 2012-08-10 DIAGNOSIS — M5030 Other cervical disc degeneration, unspecified cervical region: Secondary | ICD-10-CM | POA: Insufficient documentation

## 2012-08-10 DIAGNOSIS — G20 Parkinson's disease: Secondary | ICD-10-CM | POA: Insufficient documentation

## 2012-08-10 DIAGNOSIS — G20A1 Parkinson's disease without dyskinesia, without mention of fluctuations: Secondary | ICD-10-CM | POA: Insufficient documentation

## 2012-08-10 DIAGNOSIS — G8929 Other chronic pain: Secondary | ICD-10-CM | POA: Insufficient documentation

## 2012-08-10 NOTE — ED Notes (Signed)
Pt arrives by [facility] c/o left hip, lower back and left leg pain. Pt also reports he is out of pain medication.

## 2012-08-11 MED ORDER — OXYCODONE-ACETAMINOPHEN 5-325 MG PO TABS: 1.0000 | ORAL_TABLET | ORAL | Status: DC | PRN

## 2012-08-11 MED ORDER — HYDROMORPHONE HCL PF 1 MG/ML IJ SOLN
1.0000 mg | Freq: Once | INTRAMUSCULAR | Status: AC
  Administered 2012-08-11: 1 mg via INTRAMUSCULAR
  Filled 2012-08-11: qty 1

## 2012-08-11 NOTE — ED Provider Notes (Signed)
History     CSN: [id number]  Arrival date & time 08/10/12  2225   First MD Initiated Contact with Patient 08/11/12 0245      Chief Complaint  Patient presents with  . Back Pain  . Hip Pain  . Leg Pain  . Pelvic Pain    (Consider location/radiation/quality/duration/timing/severity/associated sxs/prior treatment) HPI Comments: Pt has hx of chronic pain - states that he is out of his pain medicines - has been in the [employer] while he has nowhere to stay - he has been using a wheelchair b/c of chronic pain and has had several falls from the wheelchair in the last 2 days.  He denies weakness or numbness of the bil LE's and has chronic severe back pain which is slightly worse since falls.  No fever, vomiting, difficulty urinating.    Patient is a 56 y.o. male presenting with back pain, hip pain, leg pain, and pelvic pain. The history is provided by the patient and medical records.  Back Pain  Associated symptoms include pelvic pain and leg pain. Pertinent negatives include no fever, no numbness and no weakness.  Hip Pain  Leg Pain  Pertinent negatives include no numbness.  Pelvic Pain    Past Medical History  Diagnosis Date  . DDD (degenerative disc disease)   . Necrotizing fasciitis   . Degenerative disc disease   . Hypertension   . Degenerative disc disease   . Hepatitis C   . Chronic pain   . DDD (degenerative disc disease)   . Parkinson disease   . Mental disorder   . Depression   . DDD (degenerative disc disease)     Past Surgical History  Procedure Date  . Hip surgery   . Appendectomy   . Ankle surgery     History reviewed. No pertinent family history.  History  Substance Use Topics  . Smoking status: Current Every Day Smoker -- 1.0 packs/day for 36 years  . Smokeless tobacco: Former Neurosurgeon    Types: Chew  . Alcohol Use: Yes      Review of Systems  Constitutional: Negative for fever and chills.  HENT: Negative for neck pain.   Cardiovascular:  Negative for leg swelling.  Gastrointestinal: Negative for nausea and vomiting.       No incontinence of bowel  Genitourinary: Positive for pelvic pain. Negative for difficulty urinating.       No incontinence or retention  Musculoskeletal: Positive for back pain.  Skin: Negative for rash.  Neurological: Negative for weakness and numbness.    Allergies  Review of patient's allergies indicates no known allergies.  Home Medications   Current Outpatient Rx  Name  Route  Sig  Dispense  Refill  . METOPROLOL TARTRATE 25 MG PO TABS   Oral   Take 25 mg by mouth 2 (two) times daily.         . OXYCODONE HCL 5 MG PO TABS   Oral   Take 1 tablet (5 mg total) by mouth every 4 (four) hours as needed for pain.   6 tablet   0   . OXYCODONE HCL ER 10 MG PO TB12   Oral   Take 10 mg by mouth every 12 (twelve) hours.         . OXYCODONE-ACETAMINOPHEN 5-325 MG PO TABS   Oral   Take 1 tablet by mouth every 4 (four) hours as needed for pain.   6 tablet   0   . TAMSULOSIN HCL  0.4 MG PO CAPS   Oral   Take 0.8 mg by mouth at bedtime.           BP 119/72  Pulse 76  Temp 98.7 F (37.1 C) (Oral)  Resp 18  SpO2 98%  Physical Exam  Nursing note and vitals reviewed. Constitutional: He appears well-developed and well-nourished.  HENT:  Head: Normocephalic and atraumatic.  Eyes: Conjunctivae normal are normal. No scleral icterus.  Cardiovascular: Normal rate, regular rhythm and intact distal pulses.   Pulmonary/Chest: Effort normal and breath sounds normal.  Abdominal: Soft.       No pulsating masses, no guarding, no tenderness  Musculoskeletal: He exhibits tenderness ( Local tenderness to the right lower back, left lower back, minimal tenderness to the midline).       No spinal tenderness of the cervical, thoracic or lumbar spines  Neurological: He is alert. Coordination normal.       Gait is antalgic secondary to low back pain, isolated strength of the bilateral lower extremities  is normal, sensation normal, speech normal  Skin: Skin is warm and dry. No erythema.    ED Course  Procedures (including critical care time)  Labs Reviewed - No data to display No results found.   1. Back pain       MDM  The patient has normal vital signs, no red flags for pathologic back pain, the patient does have chronic pain, he has not been here in approximately 1/2 months for these pain complaints, you'll be given 1 mg of intramuscular Dilaudid, home with 6 tablets of Percocet.        Vida Roller, MD 08/11/12 [phone]

## 2012-08-13 ENCOUNTER — Emergency Department (HOSPITAL_COMMUNITY): Payer: Medicaid Other

## 2012-08-13 ENCOUNTER — Emergency Department (HOSPITAL_COMMUNITY)
Admission: EM | Admit: 2012-08-13 | Discharge: 2012-08-13 | Disposition: A | Discharge status: Home / Self Care | Payer: Medicaid Other | Attending: Emergency Medicine | Admitting: Emergency Medicine

## 2012-08-13 ENCOUNTER — Encounter (HOSPITAL_COMMUNITY): Payer: Self-pay | Admitting: *Deleted

## 2012-08-13 DIAGNOSIS — W050XXA Fall from non-moving wheelchair, initial encounter: Secondary | ICD-10-CM | POA: Insufficient documentation

## 2012-08-13 DIAGNOSIS — Z9181 History of falling: Secondary | ICD-10-CM | POA: Insufficient documentation

## 2012-08-13 DIAGNOSIS — F17200 Nicotine dependence, unspecified, uncomplicated: Secondary | ICD-10-CM | POA: Insufficient documentation

## 2012-08-13 DIAGNOSIS — M549 Dorsalgia, unspecified: Secondary | ICD-10-CM

## 2012-08-13 DIAGNOSIS — W19XXXA Unspecified fall, initial encounter: Secondary | ICD-10-CM

## 2012-08-13 DIAGNOSIS — F3289 Other specified depressive episodes: Secondary | ICD-10-CM | POA: Insufficient documentation

## 2012-08-13 DIAGNOSIS — IMO0002 Reserved for concepts with insufficient information to code with codable children: Secondary | ICD-10-CM | POA: Insufficient documentation

## 2012-08-13 DIAGNOSIS — Z79899 Other long term (current) drug therapy: Secondary | ICD-10-CM | POA: Insufficient documentation

## 2012-08-13 DIAGNOSIS — M726 Necrotizing fasciitis: Secondary | ICD-10-CM | POA: Insufficient documentation

## 2012-08-13 DIAGNOSIS — Z9889 Other specified postprocedural states: Secondary | ICD-10-CM | POA: Insufficient documentation

## 2012-08-13 DIAGNOSIS — G20 Parkinson's disease: Secondary | ICD-10-CM | POA: Insufficient documentation

## 2012-08-13 DIAGNOSIS — Y929 Unspecified place or not applicable: Secondary | ICD-10-CM | POA: Insufficient documentation

## 2012-08-13 DIAGNOSIS — F329 Major depressive disorder, single episode, unspecified: Secondary | ICD-10-CM | POA: Insufficient documentation

## 2012-08-13 DIAGNOSIS — G8929 Other chronic pain: Secondary | ICD-10-CM | POA: Insufficient documentation

## 2012-08-13 DIAGNOSIS — Y9389 Activity, other specified: Secondary | ICD-10-CM | POA: Insufficient documentation

## 2012-08-13 DIAGNOSIS — G20A1 Parkinson's disease without dyskinesia, without mention of fluctuations: Secondary | ICD-10-CM | POA: Insufficient documentation

## 2012-08-13 MED ORDER — HYDROMORPHONE HCL PF 1 MG/ML IJ SOLN
1.0000 mg | Freq: Once | INTRAMUSCULAR | Status: AC
  Administered 2012-08-13: 1 mg via INTRAMUSCULAR
  Filled 2012-08-13: qty 1

## 2012-08-13 MED ORDER — OXYCODONE-ACETAMINOPHEN 5-325 MG PO TABS: 2.0000 | ORAL_TABLET | ORAL | Status: DC | PRN

## 2012-08-13 NOTE — ED Notes (Signed)
Per ems: [facility], reports "falling out of wheelchair 6 times in past 2 days". Falls have been unwittnessed.c/o back pain. Pt was seen here few days ago for same symptoms. bp 180 palpated, pulse 84, respirations 30

## 2012-08-13 NOTE — ED Notes (Signed)
[id number]<JY> Expected date:<BR> Expected time:<BR> Means of arrival:Ambulance<BR> Comments:<BR> Fall/back pain

## 2012-08-13 NOTE — ED Notes (Signed)
MD at bedside. Dr. Yao at bedside.  

## 2012-08-13 NOTE — ED Notes (Signed)
Patient transported to X-ray 

## 2012-08-13 NOTE — ED Notes (Signed)
[facility] called for pt transportation home 

## 2012-08-13 NOTE — ED Provider Notes (Signed)
History     CSN: [id number]  Arrival date & time 08/13/12  1458   First MD Initiated Contact with Patient 08/13/12 1529      Chief Complaint  Patient presents with  . Back Pain  . Fall    (Consider location/radiation/quality/duration/timing/severity/associated sxs/prior treatment) The history is provided by the patient.  Jeffrey [name] is a 56 y.o. male hx of degenerative disc disease, chronic weakness and wheelchair bound here s/p fall off of his wheelchair. He slipped and fell off his wheelchair and hit his back and head. No LOC or headache. He has severe back and neck pain afterwards. He has frequent falls and has been here for the same problem recently. He is chronically weak and denies worsening weakness or numbness or incontinence.    Past Medical History  Diagnosis Date  . DDD (degenerative disc disease)   . Necrotizing fasciitis   . Degenerative disc disease   . Hypertension   . Degenerative disc disease   . Hepatitis C   . Chronic pain   . DDD (degenerative disc disease)   . Parkinson disease   . Mental disorder   . Depression   . DDD (degenerative disc disease)     Past Surgical History  Procedure Date  . Hip surgery   . Appendectomy   . Ankle surgery     No family history on file.  History  Substance Use Topics  . Smoking status: Current Every Day Smoker -- 1.0 packs/day for 36 years  . Smokeless tobacco: Former Neurosurgeon    Types: Chew  . Alcohol Use: Yes      Review of Systems  Musculoskeletal:       Back pain, neck pain   All other systems reviewed and are negative.    Allergies  Review of patient's allergies indicates no known allergies.  Home Medications   Current Outpatient Rx  Name  Route  Sig  Dispense  Refill  . METOPROLOL TARTRATE 25 MG PO TABS   Oral   Take 25 mg by mouth 2 (two) times daily.         . OXYCODONE HCL 5 MG PO TABS   Oral   Take 5 mg by mouth every 6 (six) hours as needed. Pain         . OXYCODONE HCL ER  20 MG PO TB12   Oral   Take 40 mg by mouth every 12 (twelve) hours.          . TAMSULOSIN HCL 0.4 MG PO CAPS   Oral   Take 0.8 mg by mouth at bedtime.           BP 160/106  Pulse 73  Temp 98.9 F (37.2 C) (Oral)  Resp 22  SpO2 100%  Physical Exam  Nursing note and vitals reviewed. Constitutional: He is oriented to person, place, and time.       Chronically ill, uncomfortable. On back board and collar.  HENT:  Head: Normocephalic.  Mouth/Throat: Oropharynx is clear and moist.  Eyes: Conjunctivae normal are normal. Pupils are equal, round, and reactive to light.  Neck: Normal range of motion. Neck supple.       Mild paracervical tenderness, ? T1 tenderness.   Cardiovascular: Normal rate, regular rhythm and normal heart sounds.   Pulmonary/Chest: Breath sounds normal. No respiratory distress. He has no wheezes. He has no rales.  Abdominal: Soft. Bowel sounds are normal. He exhibits no distension. There is no tenderness. There is no rebound.  Musculoskeletal: Normal range of motion.       Mild paralumbar tenderness, no deformity.   Neurological: He is alert and oriented to person, place, and time.       4/5 strength bil legs (chronic). Nl sensation. No saddle anesthesia.   Skin: Skin is warm and dry.  Psychiatric: He has a normal mood and affect. His behavior is normal. Judgment and thought content normal.    ED Course  Procedures (including critical care time)  Labs Reviewed - No data to display Dg Cervical Spine Complete  08/13/2012  *RADIOLOGY REPORT*  Clinical Data: Larey Seat.  Neck pain.  CERVICAL SPINE - COMPLETE 4+ VIEW  Comparison: CT scan 08/25/2010.  Findings: The lateral film demonstrates stable alignment of the cervical vertebral bodies.  Stable anterior subluxation of C4 compared to C5.  No acute bony findings.  Stable degenerative cervical spondylosis with severe multilevel facet disease.  No abnormal prevertebral soft tissue swelling.  The neural foramen are  narrowed bilaterally.  The C1-2 articulations are maintained.  The dens appears intact.  The lung apices are grossly clear.  IMPRESSION:  1.  Degenerative cervical spondylosis with stable degenerative anterior subluxation of C4. 2.  No acute fracture.   Original Report Authenticated By: Rudie Meyer, M.D.    Dg Lumbar Spine Complete  08/13/2012  *RADIOLOGY REPORT*  Clinical Data: Back pain post fall  LUMBAR SPINE - COMPLETE 4+ VIEW  Comparison: None.  Findings: Five views of the lumbar spine submitted.  Mild dextroscoliosis.  No acute fracture or subluxation.  Stable degenerative changes with multilevel anterior spurring.  Stable multilevel disc space flattening. Large right lateral osteophytes at L2-L3 and L3-L4 levels are noted. Atherosclerotic calcifications of the abdominal aorta and the iliac arteries.  IMPRESSION: No acute fracture or subluxation.  Multilevel degenerative changes as described above.   Original Report Authenticated By: Natasha Mead, M.D.      No diagnosis found.    MDM  Jeffrey [name] is a 56 y.o. male here with neck and back pain s/p fall. Nonfocal neuro exam. No red flags for spinal compression. Will get basic xrays and give pain meds and reassess.   4:41 PM Xrays showed no acute fracture. C collar and back board cleared. Will d/c home on percocet prn. He has follow up with his PMD in a week.        Richardean Canal, MD 08/13/12 [phone]

## 2012-08-19 ENCOUNTER — Emergency Department (HOSPITAL_COMMUNITY)
Admission: EM | Admit: 2012-08-19 | Discharge: 2012-08-19 | Disposition: A | Discharge status: Home / Self Care | Payer: Medicaid Other | Attending: Emergency Medicine | Admitting: Emergency Medicine

## 2012-08-19 ENCOUNTER — Other Ambulatory Visit: Payer: Self-pay

## 2012-08-19 ENCOUNTER — Encounter (HOSPITAL_COMMUNITY): Payer: Self-pay | Admitting: *Deleted

## 2012-08-19 ENCOUNTER — Emergency Department (HOSPITAL_COMMUNITY): Payer: Medicaid Other

## 2012-08-19 DIAGNOSIS — Y9389 Activity, other specified: Secondary | ICD-10-CM | POA: Insufficient documentation

## 2012-08-19 DIAGNOSIS — W050XXA Fall from non-moving wheelchair, initial encounter: Secondary | ICD-10-CM | POA: Insufficient documentation

## 2012-08-19 DIAGNOSIS — Z8619 Personal history of other infectious and parasitic diseases: Secondary | ICD-10-CM | POA: Insufficient documentation

## 2012-08-19 DIAGNOSIS — F17200 Nicotine dependence, unspecified, uncomplicated: Secondary | ICD-10-CM | POA: Insufficient documentation

## 2012-08-19 DIAGNOSIS — Z760 Encounter for issue of repeat prescription: Secondary | ICD-10-CM | POA: Insufficient documentation

## 2012-08-19 DIAGNOSIS — G20A1 Parkinson's disease without dyskinesia, without mention of fluctuations: Secondary | ICD-10-CM | POA: Insufficient documentation

## 2012-08-19 DIAGNOSIS — G8929 Other chronic pain: Secondary | ICD-10-CM | POA: Insufficient documentation

## 2012-08-19 DIAGNOSIS — IMO0002 Reserved for concepts with insufficient information to code with codable children: Secondary | ICD-10-CM | POA: Insufficient documentation

## 2012-08-19 DIAGNOSIS — I10 Essential (primary) hypertension: Secondary | ICD-10-CM | POA: Insufficient documentation

## 2012-08-19 DIAGNOSIS — G20 Parkinson's disease: Secondary | ICD-10-CM | POA: Insufficient documentation

## 2012-08-19 DIAGNOSIS — M549 Dorsalgia, unspecified: Secondary | ICD-10-CM

## 2012-08-19 DIAGNOSIS — Z8659 Personal history of other mental and behavioral disorders: Secondary | ICD-10-CM | POA: Insufficient documentation

## 2012-08-19 DIAGNOSIS — Z79899 Other long term (current) drug therapy: Secondary | ICD-10-CM | POA: Insufficient documentation

## 2012-08-19 DIAGNOSIS — Z8739 Personal history of other diseases of the musculoskeletal system and connective tissue: Secondary | ICD-10-CM | POA: Insufficient documentation

## 2012-08-19 DIAGNOSIS — Y929 Unspecified place or not applicable: Secondary | ICD-10-CM | POA: Insufficient documentation

## 2012-08-19 MED ORDER — OXYCODONE-ACETAMINOPHEN 5-325 MG PO TABS
1.0000 | ORAL_TABLET | Freq: Once | ORAL | Status: AC
  Administered 2012-08-19: 1 via ORAL
  Filled 2012-08-19: qty 1

## 2012-08-19 MED ORDER — OXYCODONE-ACETAMINOPHEN 5-325 MG PO TABS: 1.0000 | ORAL_TABLET | Freq: Four times a day (QID) | ORAL | Status: DC | PRN

## 2012-08-19 NOTE — ED Notes (Signed)
[id number]<QQ> Expected date:08/19/12<BR> Expected time: 8:32 AM<BR> Means of arrival:Ambulance<BR> Comments:<BR> Back Pain

## 2012-08-19 NOTE — ED Notes (Signed)
[facility] arrived and transporting pt 

## 2012-08-19 NOTE — ED Provider Notes (Signed)
History     CSN: [id number]  Arrival date & time 08/19/12  4098   First MD Initiated Contact with Patient 08/19/12 [phone]      Chief Complaint  Patient presents with  . Back Pain    Chronic  . Medication Refill    (Consider location/radiation/quality/duration/timing/severity/associated sxs/prior treatment) HPI Pt presents with c/o low back pain.  Pt states he has chronic pain, but his power wheelchair is broken now and he has been using a different wheelchair which has caused him to fall multiple times from it- last fall onto back was yesterday.  He was seen on 12/8 in the ED and had xrays but has fallen twice since then.  Took all percocet that was given him.  No incontinence or retention of bowel or bladder, no fever/chills, no weakness of legs.  States he has been trying to get a primary care doctor, but has not been successful yet.  Has also seen pain management but was told that her was in "too much pain" and they couldn't treat him.  Movement and palpation makes pain worse. There are no other associated systemic symptoms, there are no other alleviating or modifying factors.   Past Medical History  Diagnosis Date  . DDD (degenerative disc disease)   . Necrotizing fasciitis   . Degenerative disc disease   . Hypertension   . Degenerative disc disease   . Hepatitis C   . Chronic pain   . DDD (degenerative disc disease)   . Parkinson disease   . Mental disorder   . Depression   . DDD (degenerative disc disease)     Past Surgical History  Procedure Date  . Hip surgery   . Appendectomy   . Ankle surgery     History reviewed. No pertinent family history.  History  Substance Use Topics  . Smoking status: Current Every Day Smoker -- 1.0 packs/day for 36 years  . Smokeless tobacco: Former Neurosurgeon    Types: Chew  . Alcohol Use: Yes      Review of Systems ROS reviewed and all otherwise negative except for mentioned in HPI  Allergies  Review of patient's allergies  indicates no known allergies.  Home Medications   Current Outpatient Rx  Name  Route  Sig  Dispense  Refill  . METOPROLOL TARTRATE 25 MG PO TABS   Oral   Take 25 mg by mouth 2 (two) times daily.         Marland Kitchen TAMSULOSIN HCL 0.4 MG PO CAPS   Oral   Take 0.8 mg by mouth at bedtime.         . OXYCODONE-ACETAMINOPHEN 5-325 MG PO TABS   Oral   Take 1-2 tablets by mouth every 6 (six) hours as needed for pain.   15 tablet   0     BP 156/96  Pulse 64  Temp 98 F (36.7 C) (Oral)  Resp 17  SpO2 98% Vitals reviewed Physical Exam Physical Examination: General appearance - alert, chronically ill appearing, and in no distress Mental status - alert, oriented to person, place, and time Chest - clear to auscultation, no wheezes, rales or rhonchi, symmetric air entry Heart - normal rate, regular rhythm, normal S1, S2, no murmurs, rubs, clicks or gallops Back exam - mild ttp in midline of lumbar region, no thoracic or cervical tenderness Neurological - alert, oriented, normal speech, strength 5/5 in extremities x 4, sensation intact distally Extremities - peripheral pulses normal, no pedal edema, no clubbing or  cyanosis Skin - normal coloration and turgor, no rashes, no suspicious skin lesions noted Psych- tearful but cooperative  ED Course  Procedures (including critical care time)  Labs Reviewed - No data to display Dg Lumbar Spine Complete  08/19/2012  *RADIOLOGY REPORT*  Clinical Data: Fall 3 days ago.  Low back injury and pain.  LUMBAR SPINE - COMPLETE 4+ VIEW  Comparison: 08/13/2012  Findings: No evidence of acute fracture, spondylolysis, or spondylolisthesis.  Severe degenerative disc disease is seen from levels of L2-L5. Bilateral facet DJD is again seen as well as mild dextroscoliosis. No focal lytic or sclerotic bone lesions identified.  IMPRESSION:  1.  Stable exam.  No acute findings. 2.  Advanced degenerative spondylosis and mild scoliosis.   Original Report Authenticated By:  Myles Rosenthal, M.D.      1. Back pain       MDM  Pt with hx of chronic back pain presents with worsening low back pain after several falls from his wheelchair.  Had reassuring xray 12/8 during last visit, but states he has fallen twice since then.  He is having his regular wheelchair fixed which is why he is having recurrent falls at this time.  Xray today reassuring- images reviewed by me as well. Pt given short course of percocet for his pain.  Encouraged to continue to seek pain management as he states he is in the process of doing.         Ethelda Chick, MD 08/19/12 1038

## 2012-08-19 NOTE — ED Notes (Signed)
Pt alert and oriented x4. Respirations even and unlabored, bilateral symmetrical rise and fall of chest. Skin warm and dry. In no acute distress. Denies needs.   

## 2012-08-19 NOTE — ED Notes (Signed)
Pt has chronic back pain, out of pain meds.

## 2012-08-25 ENCOUNTER — Emergency Department (HOSPITAL_COMMUNITY)
Admission: EM | Admit: 2012-08-25 | Discharge: 2012-08-25 | Disposition: A | Discharge status: Home / Self Care | Payer: Medicaid Other | Attending: Emergency Medicine | Admitting: Emergency Medicine

## 2012-08-25 ENCOUNTER — Encounter (HOSPITAL_COMMUNITY): Payer: Self-pay | Admitting: Emergency Medicine

## 2012-08-25 DIAGNOSIS — Z8739 Personal history of other diseases of the musculoskeletal system and connective tissue: Secondary | ICD-10-CM | POA: Insufficient documentation

## 2012-08-25 DIAGNOSIS — M549 Dorsalgia, unspecified: Secondary | ICD-10-CM

## 2012-08-25 DIAGNOSIS — I10 Essential (primary) hypertension: Secondary | ICD-10-CM | POA: Insufficient documentation

## 2012-08-25 DIAGNOSIS — F17200 Nicotine dependence, unspecified, uncomplicated: Secondary | ICD-10-CM | POA: Insufficient documentation

## 2012-08-25 DIAGNOSIS — F1410 Cocaine abuse, uncomplicated: Secondary | ICD-10-CM | POA: Insufficient documentation

## 2012-08-25 DIAGNOSIS — G20A1 Parkinson's disease without dyskinesia, without mention of fluctuations: Secondary | ICD-10-CM | POA: Insufficient documentation

## 2012-08-25 DIAGNOSIS — G8929 Other chronic pain: Secondary | ICD-10-CM | POA: Insufficient documentation

## 2012-08-25 DIAGNOSIS — Z8659 Personal history of other mental and behavioral disorders: Secondary | ICD-10-CM | POA: Insufficient documentation

## 2012-08-25 DIAGNOSIS — G20 Parkinson's disease: Secondary | ICD-10-CM | POA: Insufficient documentation

## 2012-08-25 DIAGNOSIS — M545 Low back pain, unspecified: Secondary | ICD-10-CM | POA: Insufficient documentation

## 2012-08-25 DIAGNOSIS — Z8619 Personal history of other infectious and parasitic diseases: Secondary | ICD-10-CM | POA: Insufficient documentation

## 2012-08-25 DIAGNOSIS — Z79899 Other long term (current) drug therapy: Secondary | ICD-10-CM | POA: Insufficient documentation

## 2012-08-25 DIAGNOSIS — F1210 Cannabis abuse, uncomplicated: Secondary | ICD-10-CM | POA: Insufficient documentation

## 2012-08-25 MED ORDER — OXYCODONE-ACETAMINOPHEN 5-325 MG PO TABS
2.0000 | ORAL_TABLET | Freq: Once | ORAL | Status: AC
  Administered 2012-08-25: 2 via ORAL
  Filled 2012-08-25: qty 2

## 2012-08-25 NOTE — ED Notes (Signed)
Pt BIB EMS. Pt from [facility]. Pt c/o chronic low back pain which radiates to legs. Pt states he has had a harder time walking due to back pain. Pt also uses wheelchair. Pt able to take two steps and transfer to EMS gurney. Pt reported that he stopped taking his BP meds because he didn't like how they made him feel.

## 2012-08-25 NOTE — ED Notes (Signed)
[facility] called for transport.  

## 2012-08-25 NOTE — ED Notes (Signed)
Social work called for transport assistance

## 2012-08-25 NOTE — Progress Notes (Signed)
CSW met with pt at bedside to assess for current transportation needs. Pt is a resident at [facility]. Per discussion with patient, and examination of patient wheelchair, pt is unable to use [facility] the [facility] due to weakness. Pt wheelchair does not have wheels that can be propelled with patient hands. Per discussion with patient, pt power wheelchair is being fixed at [facility] where patient use to reside. Pt stated he spoke with [facility] at [facility] today, however pt was unable to find out when his power wheelchair will be fixed. CSW attempted to reach administrator at [facility] care however CSW was unable to reach anyone. Pt plans to return to [facility]. Per discussion with [facility], [facility] to stay in the lobby during the day due to patient limited mobility in this wheel chair. CSW provided patient with cab voucher to [facility]. CSW received approval from department director for cab voucher.   Catha Gosselin, LCSWA  [phone] .08/25/2012 1727pm

## 2012-08-25 NOTE — ED Notes (Signed)
Per [employer] supervisor, they are not able to transport to homeless shelter

## 2012-08-25 NOTE — ED Provider Notes (Signed)
History     CSN: [id number]  Arrival date & time 08/25/12  1507   First MD Initiated Contact with Patient 08/25/12 1535      No chief complaint on file.   (Consider location/radiation/quality/duration/timing/severity/associated sxs/prior treatment) HPI  56 year old male with history of chronic back pain presents complaining of low back pain. Patient was brought in via EMS. Pain is described as a sharp and throbbing sensation to his low back, nonradiating, persistent, worsening with movement, moderate in severity. Pain has been worsen since his normal wheelchair is at the shop to have it fixed, and he has to use another wheel chair. The new wheelchair requires him to use his leg to push, thus worsening his pain.  He denies fever, chills, rash, urinary/bowel incontinence or recent trauma. Patient reports he has been having chronic low back pain since 1990. He has been trying to get in touch with her pain specialist and is currently in the process of been seen by them.  However his pain medication has ran out several days ago.  Has been taking OTC pain meds without adequate relief.  Patient has been seen in the ED for complaints of back pain on a weekly basis for the past several months.    Past Medical History  Diagnosis Date  . DDD (degenerative disc disease)   . Necrotizing fasciitis   . Degenerative disc disease   . Hypertension   . Degenerative disc disease   . Hepatitis C   . Chronic pain   . DDD (degenerative disc disease)   . Parkinson disease   . Mental disorder   . Depression   . DDD (degenerative disc disease)     Past Surgical History  Procedure Date  . Hip surgery   . Appendectomy   . Ankle surgery     No family history on file.  History  Substance Use Topics  . Smoking status: Current Every Day Smoker -- 1.0 packs/day for 36 years  . Smokeless tobacco: Former Neurosurgeon    Types: Chew  . Alcohol Use: Yes      Review of Systems  Constitutional: Negative for  fever and chills.  Musculoskeletal: Positive for back pain.  Skin: Negative for rash.  Neurological: Negative for numbness.    Allergies  Review of patient's allergies indicates no known allergies.  Home Medications   Current Outpatient Rx  Name  Route  Sig  Dispense  Refill  . METOPROLOL TARTRATE 25 MG PO TABS   Oral   Take 25 mg by mouth 2 (two) times daily.         . OXYCODONE-ACETAMINOPHEN 5-325 MG PO TABS   Oral   Take 1-2 tablets by mouth every 6 (six) hours as needed for pain.   15 tablet   0   . TAMSULOSIN HCL 0.4 MG PO CAPS   Oral   Take 0.8 mg by mouth at bedtime.           BP 124/81  Pulse 70  Temp 99.1 F (37.3 C) (Oral)  Resp 19  SpO2 95%  Physical Exam  Nursing note and vitals reviewed. Constitutional: He is oriented to person, place, and time. He appears well-developed and well-nourished. No distress.  Eyes: Conjunctivae normal are normal.  Neck: Neck supple.  Musculoskeletal: He exhibits tenderness (Tenderness along spine midline without step-off, crepitus, or overlying skin changes.). He exhibits no edema.  Neurological: He is alert and oriented to person, place, and time.  5 out 5 strength in all 4 extremities with normal sensation intact  Skin: No rash noted.    ED Course  Procedures (including critical care time)  Labs Reviewed - No data to display No results found.   No diagnosis found.  1. Chronic back pain  MDM  Patient presents with chronic back pain. Is seen in ED multiple times for his chronic back pain. No red flags. No concerning finding on exam.  No recent fall concerning for new fx.    I have reviewed records of multiple ED visits with similar or other pain related complaints, usually with negative workups.  I feel that the patient's pain is chronic and cannot appropriately or safely treated in an emergency department setting.   I do not feel that providing narcotic pain medication is in this patient's best  interest.  I have urged the patient to have close follow up with their provider or pain specialist.  I have explicitly discussed with the patient return precautions and have reassured patient that they can always be seen and evaluated in the emergency department for any condition that they feel is emergent, and that they will be given treatment as the EDP feels is appropriate and safe, but this may not involve the use of narcotic pain medications.   The patient was given the opportunity to voice any further questions or concerns and these were addressed to the best of my ability.  BP 124/81  Pulse 70  Temp 99.1 F (37.3 C) (Oral)  Resp 19  SpO2 95%        Fayrene Helper, PA-C 08/25/12 1551

## 2012-08-26 NOTE — ED Provider Notes (Signed)
Medical screening examination/treatment/procedure(s) were performed by non-physician practitioner and as supervising physician I was immediately available for consultation/collaboration.   Kelita Wallis III, MD 08/26/12 1343 

## 2012-08-27 ENCOUNTER — Emergency Department (HOSPITAL_COMMUNITY)
Admission: EM | Admit: 2012-08-27 | Discharge: 2012-08-28 | Disposition: A | Discharge status: Home / Self Care | Payer: Medicaid Other | Attending: Emergency Medicine | Admitting: Emergency Medicine

## 2012-08-27 ENCOUNTER — Emergency Department (HOSPITAL_COMMUNITY): Payer: Medicaid Other

## 2012-08-27 DIAGNOSIS — F329 Major depressive disorder, single episode, unspecified: Secondary | ICD-10-CM | POA: Insufficient documentation

## 2012-08-27 DIAGNOSIS — B1920 Unspecified viral hepatitis C without hepatic coma: Secondary | ICD-10-CM | POA: Insufficient documentation

## 2012-08-27 DIAGNOSIS — Y939 Activity, unspecified: Secondary | ICD-10-CM | POA: Insufficient documentation

## 2012-08-27 DIAGNOSIS — F17200 Nicotine dependence, unspecified, uncomplicated: Secondary | ICD-10-CM | POA: Insufficient documentation

## 2012-08-27 DIAGNOSIS — F3289 Other specified depressive episodes: Secondary | ICD-10-CM | POA: Insufficient documentation

## 2012-08-27 DIAGNOSIS — IMO0002 Reserved for concepts with insufficient information to code with codable children: Secondary | ICD-10-CM | POA: Insufficient documentation

## 2012-08-27 DIAGNOSIS — Z79899 Other long term (current) drug therapy: Secondary | ICD-10-CM | POA: Insufficient documentation

## 2012-08-27 DIAGNOSIS — G20A1 Parkinson's disease without dyskinesia, without mention of fluctuations: Secondary | ICD-10-CM | POA: Insufficient documentation

## 2012-08-27 DIAGNOSIS — G20 Parkinson's disease: Secondary | ICD-10-CM | POA: Insufficient documentation

## 2012-08-27 DIAGNOSIS — W19XXXA Unspecified fall, initial encounter: Secondary | ICD-10-CM | POA: Insufficient documentation

## 2012-08-27 DIAGNOSIS — F489 Nonpsychotic mental disorder, unspecified: Secondary | ICD-10-CM | POA: Insufficient documentation

## 2012-08-27 DIAGNOSIS — Z9889 Other specified postprocedural states: Secondary | ICD-10-CM | POA: Insufficient documentation

## 2012-08-27 DIAGNOSIS — I10 Essential (primary) hypertension: Secondary | ICD-10-CM | POA: Insufficient documentation

## 2012-08-27 DIAGNOSIS — G8929 Other chronic pain: Secondary | ICD-10-CM

## 2012-08-27 DIAGNOSIS — M726 Necrotizing fasciitis: Secondary | ICD-10-CM | POA: Insufficient documentation

## 2012-08-27 DIAGNOSIS — Y929 Unspecified place or not applicable: Secondary | ICD-10-CM | POA: Insufficient documentation

## 2012-08-27 NOTE — ED Notes (Signed)
Bed:WA05<BR> Expected date:<BR> Expected time:<BR> Means of arrival:<BR> Comments:<BR> EMS

## 2012-08-27 NOTE — ED Notes (Signed)
Removed cervical collar from patient 's neck

## 2012-08-27 NOTE — ED Provider Notes (Signed)
History     CSN: [id number]  Arrival date & time 08/27/12  2055   First MD Initiated Contact with Patient 08/27/12 2106      Chief Complaint  Patient presents with  . Back Pain    questionable neck pain    HPI  History provided by the patient. Patient is a 56 year old male with history of hepatitis C, hypertension, degenerative disc disease with chronic back pain who presents with complaints of back pains after a fall. Patient reports using a temporary wheelchair to help get around secondary to his chronic pains when he lost balance and fell backwards in a chair. Since that time he reports having worsening back pain. Patient also admits to alcohol use prior to the fall. Patient was very upset and states he was drinking because she was not picked up to attend his regular church service. Patient complains of increasing pains throughout his back worse than his chronic pain. He also complains of some pain in his left hip radiating up history of prior extensive surgery secondary to necrotizing fasciitis. Patient denies any significant head injury and no LOC. Patient denies any other complaints.    Past Medical History  Diagnosis Date  . DDD (degenerative disc disease)   . Necrotizing fasciitis   . Degenerative disc disease   . Hypertension   . Degenerative disc disease   . Hepatitis C   . Chronic pain   . DDD (degenerative disc disease)   . Parkinson disease   . Mental disorder   . Depression   . DDD (degenerative disc disease)     Past Surgical History  Procedure Date  . Hip surgery   . Appendectomy   . Ankle surgery     No family history on file.  History  Substance Use Topics  . Smoking status: Current Every Day Smoker -- 1.0 packs/day for 36 years  . Smokeless tobacco: Former Neurosurgeon    Types: Chew  . Alcohol Use: Yes      Review of Systems  HENT: Positive for neck pain.   Respiratory: Negative for cough and shortness of breath.   Cardiovascular: Negative for  chest pain.  Musculoskeletal: Positive for back pain.       Left hip pain  Neurological: Negative for headaches.  All other systems reviewed and are negative.    Allergies  Review of patient's allergies indicates no known allergies.  Home Medications   Current Outpatient Rx  Name  Route  Sig  Dispense  Refill  . METOPROLOL TARTRATE 25 MG PO TABS   Oral   Take 25 mg by mouth 2 (two) times daily.         . OXYCODONE-ACETAMINOPHEN 5-325 MG PO TABS   Oral   Take 1-2 tablets by mouth every 6 (six) hours as needed for pain.   15 tablet   0   . TAMSULOSIN HCL 0.4 MG PO CAPS   Oral   Take 0.8 mg by mouth at bedtime.           BP 122/77  Pulse 63  Temp 98.1 F (36.7 C) (Oral)  Resp 22  SpO2 98%  Physical Exam  Nursing note and vitals reviewed. Constitutional: He appears well-developed and well-nourished. No distress.  HENT:  Head: Normocephalic.  Cardiovascular: Normal rate and regular rhythm.   No murmur heard. Pulmonary/Chest: Effort normal and breath sounds normal. No respiratory distress. He has no wheezes. He has no rales.  Abdominal: Soft. There is no tenderness. There  is no guarding.  Musculoskeletal:       Lumbar back: He exhibits tenderness.       Back:       Old surgical scars with loss of normal tissue to the lateral left hip and thigh area. No gross deformities. Patient reports some tenderness over this area. There are additional surgical scars over the left lower leg consistent with history of prior surgeries. Normal dorsal pedal pulses and sensations bilaterally.  There is mild tenderness along the cervical and lumbar spine area though this seems decreased during distraction. No gross deformities.    ED Course  Procedures   Dg Cervical Spine Complete  08/27/2012  *RADIOLOGY REPORT*  Clinical Data: Neck pain.  CERVICAL SPINE - COMPLETE 4+ VIEW  Comparison: Cervical spine radiographs performed 08/13/2012  Findings: There is no evidence of acute  fracture or subluxation. There is stable grade 1 anterolisthesis of C4 on C5.  The lower cervical spine is not well characterized on lateral views. Vertebral bodies demonstrate normal height; mild facet disease is noted along the cervical spine.  Visualized intervertebral disc spaces are preserved.  Prevertebral soft tissues are within normal limits.  The provided odontoid view demonstrates no significant abnormality.  The visualized lung apices are clear.  IMPRESSION:  1.  No evidence of acute fracture or subluxation along the cervical spine. 2.  Stable appearance to grade 1 anterolisthesis of C4 on C5, with mild associated degenerative change.   Original Report Authenticated By: Tonia Ghent, M.D.    Dg Lumbar Spine Complete  08/27/2012  *RADIOLOGY REPORT*  Clinical Data: Lower back pain.  LUMBAR SPINE - COMPLETE 4+ VIEW  Comparison: Lumbar spine radiographs performed 08/19/2012  Findings: There is no evidence of fracture or subluxation. Vertebral bodies demonstrate normal height and alignment. Multilevel disc space narrowing is noted along the lumbar spine, with associated anterior and lateral osteophytes, and mild associated sclerotic change.  This is stable in appearance from the prior study.  There is chronic deformity involving the right posterior lower ribs.  The visualized bowel gas pattern is unremarkable in appearance; air and stool are noted within the colon.  The sacroiliac joints are within normal limits.  Clips are noted at the right lower quadrant.  IMPRESSION:  1.  No evidence of fracture or subluxation along the lumbar spine. 2.  Stable appearance to degenerative spondylosis along the lumbar spine.   Original Report Authenticated By: Tonia Ghent, M.D.    Dg Hip Complete Left  08/27/2012  *RADIOLOGY REPORT*  Clinical Data: Severe left hip pain.  LEFT HIP - COMPLETE 2+ VIEW  Comparison: Left hip MRI performed 01/21/2006, and CT of the abdomen and pelvis performed 06/25/2011  Findings: As  noted on the prior CT, there is superior dislocation of the left hip, with marked associated erosive arthropathy, resulting in complete destruction of the left femoral head. Surrounding heterotopic bone formation is noted, with marked erosion involving much of the left ischium, and mild sclerotic change extending into the ilium.  There is perhaps more sclerosis involving the left femoral neck than on the prior study, with slightly increased irregularity involving the left ischium.  Mild sclerotic change is again noted at the right ischium, with slight flattening of the right femoral head.  Degenerative change is noted at the lower lumbar spine.  Clips are seen at the right lower quadrant.  The visualized bowel gas pattern is grossly unremarkable.  Scattered vascular calcifications are seen.  IMPRESSION:  1.  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  Erosion involves much of the left ischium, with associated sclerotic change.  There may be more sclerosis involving the left femoral neck, and mildly increased irregularity involving the left ischium, than on the prior CT from 06/25/2011. 2.  Mild degenerative change at the right femoral head. 3.  Scattered vascular calcifications seen.   Original Report Authenticated By: Tonia Ghent, M.D.      1. Fall   2. Chronic pain       MDM  9:20 PM patient seen and evaluated. Patient is tearful and upset about not making a church service but otherwise no acute distress.  X-rays unremarkable. No signs of new injuries. At this time patient felt stable to discharge home.      Angus Seller, PA 08/28/12 [phone]

## 2012-08-27 NOTE — ED Notes (Signed)
ems- [facility], patient was found on ground in wheelchair states he fell back in it, has been drinking, hx- homeless, extensive medical history denies dm, heart issues,. Alert and orient x 3 . Complaints of back pain lower. ( Chronic issues- back pain)  Arrived with cervical collar for support .  [id number] , 82 hr,  29 labored.

## 2012-08-28 ENCOUNTER — Encounter (HOSPITAL_COMMUNITY): Payer: Self-pay | Admitting: *Deleted

## 2012-08-28 ENCOUNTER — Emergency Department (HOSPITAL_COMMUNITY)
Admission: EM | Admit: 2012-08-28 | Discharge: 2012-08-28 | Disposition: A | Discharge status: Home / Self Care | Payer: Medicaid Other | Attending: Emergency Medicine | Admitting: Emergency Medicine

## 2012-08-28 DIAGNOSIS — B1920 Unspecified viral hepatitis C without hepatic coma: Secondary | ICD-10-CM | POA: Insufficient documentation

## 2012-08-28 DIAGNOSIS — F489 Nonpsychotic mental disorder, unspecified: Secondary | ICD-10-CM | POA: Insufficient documentation

## 2012-08-28 DIAGNOSIS — G20 Parkinson's disease: Secondary | ICD-10-CM | POA: Insufficient documentation

## 2012-08-28 DIAGNOSIS — F17200 Nicotine dependence, unspecified, uncomplicated: Secondary | ICD-10-CM | POA: Insufficient documentation

## 2012-08-28 DIAGNOSIS — G20A1 Parkinson's disease without dyskinesia, without mention of fluctuations: Secondary | ICD-10-CM | POA: Insufficient documentation

## 2012-08-28 DIAGNOSIS — M726 Necrotizing fasciitis: Secondary | ICD-10-CM | POA: Insufficient documentation

## 2012-08-28 DIAGNOSIS — F329 Major depressive disorder, single episode, unspecified: Secondary | ICD-10-CM | POA: Insufficient documentation

## 2012-08-28 DIAGNOSIS — Z9889 Other specified postprocedural states: Secondary | ICD-10-CM | POA: Insufficient documentation

## 2012-08-28 DIAGNOSIS — Z79899 Other long term (current) drug therapy: Secondary | ICD-10-CM | POA: Insufficient documentation

## 2012-08-28 DIAGNOSIS — IMO0002 Reserved for concepts with insufficient information to code with codable children: Secondary | ICD-10-CM | POA: Insufficient documentation

## 2012-08-28 DIAGNOSIS — M549 Dorsalgia, unspecified: Secondary | ICD-10-CM | POA: Insufficient documentation

## 2012-08-28 DIAGNOSIS — I10 Essential (primary) hypertension: Secondary | ICD-10-CM | POA: Insufficient documentation

## 2012-08-28 DIAGNOSIS — G8929 Other chronic pain: Secondary | ICD-10-CM | POA: Insufficient documentation

## 2012-08-28 DIAGNOSIS — F3289 Other specified depressive episodes: Secondary | ICD-10-CM | POA: Insufficient documentation

## 2012-08-28 MED ORDER — OXYCODONE-ACETAMINOPHEN 5-325 MG PO TABS
2.0000 | ORAL_TABLET | Freq: Once | ORAL | Status: AC
  Administered 2012-08-28: 2 via ORAL
  Filled 2012-08-28: qty 2

## 2012-08-28 MED ORDER — TRAMADOL HCL 50 MG PO TABS: 50.0000 mg | ORAL_TABLET | Freq: Four times a day (QID) | ORAL | Status: DC | PRN

## 2012-08-28 NOTE — ED Provider Notes (Signed)
Medical screening examination/treatment/procedure(s) were performed by non-physician practitioner and as supervising physician I was immediately available for consultation/collaboration.  Olivia Mackie, MD 08/28/12 [phone]

## 2012-08-28 NOTE — ED Notes (Signed)
[facility] called unable to transport patient due to wheelchair and luggage limited room in ambulance. Patient is requesting taxi voucher. Will make house supervisor aware of need.

## 2012-08-28 NOTE — ED Provider Notes (Signed)
History     CSN: [id number]  Arrival date & time 08/28/12  0604   First MD Initiated Contact with Patient 08/28/12 [phone]      Chief Complaint  Patient presents with  . Back Pain    (Consider location/radiation/quality/duration/timing/severity/associated sxs/prior treatment) HPI Pt to the ER for back pain after being discharged from the hospital hospital a few hours ago. Pt has chronic back pain with many visits to the ER for this problem. He is in a wheel chair and says he is using this temporarily for pain relief. He informs me that his "friends of 30 years make him give them his narcotics in exchange for taking him to the pharmacy". He admits to having gone to pain management at [facility] but did not go back because the doctor told him his pain was too severe and complicated to treat but did not give him a referral anywhere else. He had a fall yesterday but has been evaluated for that with plan films. Pt did not receive any prescriptions for home according to the patient. No new injury or loc.     Past Medical History  Diagnosis Date  . DDD (degenerative disc disease)   . Necrotizing fasciitis   . Degenerative disc disease   . Hypertension   . Degenerative disc disease   . Hepatitis C   . Chronic pain   . DDD (degenerative disc disease)   . Parkinson disease   . Mental disorder   . Depression   . DDD (degenerative disc disease)     Past Surgical History  Procedure Date  . Hip surgery   . Appendectomy   . Ankle surgery     No family history on file.  History  Substance Use Topics  . Smoking status: Current Every Day Smoker -- 1.0 packs/day for 36 years  . Smokeless tobacco: Former Neurosurgeon    Types: Chew  . Alcohol Use: Yes      Review of Systems Review of Systems  HENT: Positive for neck pain.  Respiratory: Negative for cough and shortness of breath.  Cardiovascular: Negative for chest pain.  Musculoskeletal: Positive for back pain.  Left hip pain   Neurological: Negative for headaches.  All other systems reviewed and are negative.    Allergies  Review of patient's allergies indicates no known allergies.  Home Medications   Current Outpatient Rx  Name  Route  Sig  Dispense  Refill  . OXYCODONE-ACETAMINOPHEN 5-325 MG PO TABS   Oral   Take 1-2 tablets by mouth every 6 (six) hours as needed for pain.   15 tablet   0   . TAMSULOSIN HCL 0.4 MG PO CAPS   Oral   Take 0.8 mg by mouth at bedtime.         Marland Kitchen METOPROLOL TARTRATE 25 MG PO TABS   Oral   Take 25 mg by mouth 2 (two) times daily.         . TRAMADOL HCL 50 MG PO TABS   Oral   Take 1 tablet (50 mg total) by mouth every 6 (six) hours as needed for pain.   12 tablet   0     BP 127/76  Pulse 76  Temp 97.9 F (36.6 C) (Oral)  Resp 18  SpO2 97%  Physical Exam Physical Exam  Nursing note and vitals reviewed.  Constitutional: He appears well-developed and well-nourished. No distress.  HENT:  Head: Normocephalic.  Cardiovascular: Normal rate and regular rhythm.  No murmur  heard.  Pulmonary/Chest: Effort normal and breath sounds normal. No respiratory distress. He has no wheezes. He has no rales.  Abdominal: Soft. There is no tenderness. There is no guarding.  Musculoskeletal:  Lumbar back: He exhibits tenderness.  Back:  Old surgical scars with loss of normal tissue to the lateral left hip and thigh area. No gross deformities. Patient reports some tenderness over this area. There are additional surgical scars over the left lower leg consistent with history of prior surgeries. Normal dorsal pedal pulses and sensations bilaterally.  There is mild tenderness along the cervical and lumbar spine area though this seems decreased during distraction. No gross deformities.     ED Course  Procedures (including critical care time)  Labs Reviewed - No data to display Dg Cervical Spine Complete  08/27/2012  *RADIOLOGY REPORT*  Clinical Data: Neck pain.  CERVICAL  SPINE - COMPLETE 4+ VIEW  Comparison: Cervical spine radiographs performed 08/13/2012  Findings: There is no evidence of acute fracture or subluxation. There is stable grade 1 anterolisthesis of C4 on C5.  The lower cervical spine is not well characterized on lateral views. Vertebral bodies demonstrate normal height; mild facet disease is noted along the cervical spine.  Visualized intervertebral disc spaces are preserved.  Prevertebral soft tissues are within normal limits.  The provided odontoid view demonstrates no significant abnormality.  The visualized lung apices are clear.  IMPRESSION:  1.  No evidence of acute fracture or subluxation along the cervical spine. 2.  Stable appearance to grade 1 anterolisthesis of C4 on C5, with mild associated degenerative change.   Original Report Authenticated By: Tonia Ghent, M.D.    Dg Lumbar Spine Complete  08/27/2012  *RADIOLOGY REPORT*  Clinical Data: Lower back pain.  LUMBAR SPINE - COMPLETE 4+ VIEW  Comparison: Lumbar spine radiographs performed 08/19/2012  Findings: There is no evidence of fracture or subluxation. Vertebral bodies demonstrate normal height and alignment. Multilevel disc space narrowing is noted along the lumbar spine, with associated anterior and lateral osteophytes, and mild associated sclerotic change.  This is stable in appearance from the prior study.  There is chronic deformity involving the right posterior lower ribs.  The visualized bowel gas pattern is unremarkable in appearance; air and stool are noted within the colon.  The sacroiliac joints are within normal limits.  Clips are noted at the right lower quadrant.  IMPRESSION:  1.  No evidence of fracture or subluxation along the lumbar spine. 2.  Stable appearance to degenerative spondylosis along the lumbar spine.   Original Report Authenticated By: Tonia Ghent, M.D.    Dg Hip Complete Left  08/27/2012  *RADIOLOGY REPORT*  Clinical Data: Severe left hip pain.  LEFT HIP -  COMPLETE 2+ VIEW  Comparison: Left hip MRI performed 01/21/2006, and CT of the abdomen and pelvis performed 06/25/2011  Findings: As noted on the prior CT, there is superior dislocation of the left hip, with marked associated erosive arthropathy, resulting in complete destruction of the left femoral head. Surrounding heterotopic bone formation is noted, with marked erosion involving much of the left ischium, and mild sclerotic change extending into the ilium.  There is perhaps more sclerosis involving the left femoral neck than on the prior study, with slightly increased irregularity involving the left ischium.  Mild sclerotic change is again noted at the right ischium, with slight flattening of the right femoral head.  Degenerative change is noted at the lower lumbar spine.  Clips are seen at the right lower quadrant.  The visualized bowel gas pattern is grossly unremarkable.  Scattered vascular calcifications are seen.  IMPRESSION:  1.  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  Erosion involves much of the left ischium, with associated sclerotic change.  There may be more sclerosis involving the left femoral neck, and mildly increased irregularity involving the left ischium, than on the prior CT from 06/25/2011. 2.  Mild degenerative change at the right femoral head. 3.  Scattered vascular calcifications seen.   Original Report Authenticated By: Tonia Ghent, M.D.      1. Chronic back pain       MDM  pts plain films from earlier today have been reviewed and are none acute.  Pt is tearful. He needs pain management and ive discussed that the ER is not a good place for continuity of care.  2 Percocets given in the ED and an Rx for Ultram. Pain management referral given for pain clinic.  Pt has been advised of the symptoms that warrant their return to the ED. Patient has voiced understanding and has  agreed to follow-up with the PCP or specialist.        Dorthula Matas, PA 08/28/12 [phone]

## 2012-08-28 NOTE — ED Notes (Signed)
Pt with history of chronic back pain; pt seen on 12/22 for back pain and was discharged; pt did not have a ride home so he has been in his wheelchair in the lobby since 12/22 pm; pt states that "they didn't do anything for me last night so I decided to check in again" pt also states "I need to talk to the [facility] because I need a bus pass or a taxi voucher"

## 2012-08-28 NOTE — ED Notes (Signed)
Pt assisted to bed to urinate

## 2012-08-28 NOTE — Discharge Instructions (Signed)
Chronic Back Pain  When back pain lasts longer than 3 months, it is called chronic back pain.People with chronic back pain often go through certain periods that are more intense (flare-ups).  CAUSES Chronic back pain can be caused by wear and tear (degeneration) on different structures in your back. These structures include:  The bones of your spine (vertebrae) and the joints surrounding your spinal cord and nerve roots (facets).  The strong, fibrous tissues that connect your vertebrae (ligaments). Degeneration of these structures may result in pressure on your nerves. This can lead to constant pain. HOME CARE INSTRUCTIONS  Avoid bending, heavy lifting, prolonged sitting, and activities which make the problem worse.  Take brief periods of rest throughout the day to reduce your pain. Lying down or standing usually is better than sitting while you are resting.  Take over-the-counter or prescription medicines only as directed by your caregiver. SEEK IMMEDIATE MEDICAL CARE IF:   You have weakness or numbness in one of your legs or feet.  You have trouble controlling your bladder or bowels.  You have nausea, vomiting, abdominal pain, shortness of breath, or fainting. Document Released: 09/30/2004 Document Revised: 11/15/2011 Document Reviewed: 08/07/2011 [facility] Patient Information 2013 [place], [place]. Chronic Pain Management Managing chronic pain is not easy. The goal is to provide as much pain relief as possible. There are emotional as well as physical problems. Chronic pain may lead to symptoms of depression which magnify those of the pain. Problems may include:  Anxiety.  Sleep disturbances.  Confused thinking.  Feeling cranky.  Fatigue.  Weight gain or loss. Identify the source of the pain first, if possible. The pain may be masking another problem. Try to find a pain management specialist or clinic. Work with a team to create a treatment plan for you. MEDICATIONS  May  include narcotics or opioids. Larger than normal doses may be needed to control your pain.  Drugs for depression may help.  Over-the-counter medicines may help for some conditions. These drugs may be used along with others for better pain relief.  May be injected into sites such as the spine and joints. Injections may have to be repeated if they wear off. THERAPY MAY INCLUDE:  Working with a physical therapist to keep from getting stiff.  Regular, gentle exercise.  Cognitive or behavioral therapy.  Using complementary or integrative medicine such as:  Acupuncture.  Massage, Reiki, or Rolfing.  Aroma, color, light, or sound therapy.  Group support. FOR MORE INFORMATION [email or web address]. American Chronic Pain Association [email or web address]. Document Released: 09/30/2004 Document Revised: 11/15/2011 Document Reviewed: 11/09/2007 [facility] Patient Information 2013 [place], [place].

## 2012-08-30 ENCOUNTER — Emergency Department (HOSPITAL_COMMUNITY)
Admission: EM | Admit: 2012-08-30 | Discharge: 2012-08-30 | Disposition: A | Discharge status: Home / Self Care | Payer: Medicaid Other | Attending: Emergency Medicine | Admitting: Emergency Medicine

## 2012-08-30 DIAGNOSIS — I10 Essential (primary) hypertension: Secondary | ICD-10-CM | POA: Insufficient documentation

## 2012-08-30 DIAGNOSIS — M25559 Pain in unspecified hip: Secondary | ICD-10-CM | POA: Insufficient documentation

## 2012-08-30 DIAGNOSIS — F17200 Nicotine dependence, unspecified, uncomplicated: Secondary | ICD-10-CM | POA: Insufficient documentation

## 2012-08-30 DIAGNOSIS — Z8659 Personal history of other mental and behavioral disorders: Secondary | ICD-10-CM | POA: Insufficient documentation

## 2012-08-30 DIAGNOSIS — G20A1 Parkinson's disease without dyskinesia, without mention of fluctuations: Secondary | ICD-10-CM | POA: Insufficient documentation

## 2012-08-30 DIAGNOSIS — M549 Dorsalgia, unspecified: Secondary | ICD-10-CM | POA: Insufficient documentation

## 2012-08-30 DIAGNOSIS — F329 Major depressive disorder, single episode, unspecified: Secondary | ICD-10-CM | POA: Insufficient documentation

## 2012-08-30 DIAGNOSIS — M726 Necrotizing fasciitis: Secondary | ICD-10-CM | POA: Insufficient documentation

## 2012-08-30 DIAGNOSIS — F3289 Other specified depressive episodes: Secondary | ICD-10-CM | POA: Insufficient documentation

## 2012-08-30 DIAGNOSIS — G8929 Other chronic pain: Secondary | ICD-10-CM

## 2012-08-30 DIAGNOSIS — G20 Parkinson's disease: Secondary | ICD-10-CM | POA: Insufficient documentation

## 2012-08-30 DIAGNOSIS — IMO0002 Reserved for concepts with insufficient information to code with codable children: Secondary | ICD-10-CM | POA: Insufficient documentation

## 2012-08-30 DIAGNOSIS — B1920 Unspecified viral hepatitis C without hepatic coma: Secondary | ICD-10-CM | POA: Insufficient documentation

## 2012-08-30 DIAGNOSIS — Z79899 Other long term (current) drug therapy: Secondary | ICD-10-CM | POA: Insufficient documentation

## 2012-08-30 DIAGNOSIS — M25569 Pain in unspecified knee: Secondary | ICD-10-CM | POA: Insufficient documentation

## 2012-08-30 MED ORDER — ACETAMINOPHEN 325 MG PO TABS
650.0000 mg | ORAL_TABLET | Freq: Once | ORAL | Status: AC
  Administered 2012-08-30: 650 mg via ORAL
  Filled 2012-08-30: qty 2

## 2012-08-30 NOTE — ED Notes (Signed)
ptar arrived 

## 2012-08-30 NOTE — ED Notes (Addendum)
Pt alert and oriented x4. Respirations even and unlabored, bilateral symmetrical rise and fall of chest. Skin warm and dry. In no acute distress. Denies needs.   ptar called for transport

## 2012-08-30 NOTE — ED Provider Notes (Signed)
History    56yM with L hip pain. Chronic. Says out of pain meds. Multiple ED visits for the same. Denies acute injury. Says has appointment with PCP Jan 2nd, but needs pain medication until then. No fever or chills. No other acute complaints.   CSN: [id number]  Arrival date & time 08/30/12  1402   First MD Initiated Contact with Patient 08/30/12 1522      Chief Complaint  Patient presents with  . Pain    back, hip, knees    (Consider location/radiation/quality/duration/timing/severity/associated sxs/prior treatment) HPI  Past Medical History  Diagnosis Date  . DDD (degenerative disc disease)   . Necrotizing fasciitis   . Degenerative disc disease   . Hypertension   . Degenerative disc disease   . Hepatitis C   . Chronic pain   . DDD (degenerative disc disease)   . Parkinson disease   . Mental disorder   . Depression   . DDD (degenerative disc disease)     Past Surgical History  Procedure Date  . Hip surgery   . Appendectomy   . Ankle surgery     No family history on file.  History  Substance Use Topics  . Smoking status: Current Every Day Smoker -- 1.0 packs/day for 36 years  . Smokeless tobacco: Former Neurosurgeon    Types: Chew  . Alcohol Use: Yes      Review of Systems  All systems reviewed and negative, other than as noted in HPI.   Allergies  Review of patient's allergies indicates no known allergies.  Home Medications   Current Outpatient Rx  Name  Route  Sig  Dispense  Refill  . METOPROLOL TARTRATE 25 MG PO TABS   Oral   Take 25 mg by mouth 2 (two) times daily.         . OXYCODONE-ACETAMINOPHEN 5-325 MG PO TABS   Oral   Take 1-2 tablets by mouth every 6 (six) hours as needed for pain.   15 tablet   0   . TAMSULOSIN HCL 0.4 MG PO CAPS   Oral   Take 0.8 mg by mouth at bedtime.         . TRAMADOL HCL 50 MG PO TABS   Oral   Take 1 tablet (50 mg total) by mouth every 6 (six) hours as needed for pain.   12 tablet   0     BP 171/95   Pulse 80  Temp 97.9 F (36.6 C) (Oral)  Resp 16  SpO2 98%  Physical Exam  Nursing note and vitals reviewed. Constitutional: He appears well-developed and well-nourished. No distress.  HENT:  Head: Normocephalic and atraumatic.  Eyes: Conjunctivae normal are normal. Right eye exhibits no discharge. Left eye exhibits no discharge.  Neck: Neck supple.  Cardiovascular: Normal rate, regular rhythm and normal heart sounds.  Exam reveals no gallop and no friction rub.   No murmur heard. Pulmonary/Chest: Effort normal and breath sounds normal. No respiratory distress.  Abdominal: Soft. He exhibits no distension. There is no tenderness.  Musculoskeletal: He exhibits no edema and no tenderness.       Large, well-healed scar to left hip region. No concerning overlying skin changes.  Neurological: He is alert.  Skin: Skin is warm and dry.  Psychiatric: He has a normal mood and affect. His behavior is normal. Thought content normal.    ED Course  Procedures (including critical care time)  Labs Reviewed - No data to display No results found.  1. Chronic pain       MDM  56 year old male presenting with chronic pain. Not acute issue. No acute trauma. No indication for imaging. Patient with multiple recent ED visits for pain medication. Instructed the need to followup with his PCP or pain management.        Raeford Razor, MD 08/30/12 1540

## 2012-08-30 NOTE — ED Notes (Signed)
Per [facility], pt ran out of pain medication last night. Pt c/o pain back, left hip, and bilateral knee pain. Pt states that he has an appointment for medication refills with his MD on January 2nd, but has been coming to the ED for pain medication every 3 days for the past 2 weeks for pain control to hold him over until his appointment.

## 2012-08-30 NOTE — ED Notes (Signed)
[id number]<AV> Expected date:08/30/12<BR> Expected time: 2:04 PM<BR> Means of arrival:Ambulance<BR> Comments:<BR> Chronic Pain

## 2012-08-30 NOTE — ED Provider Notes (Signed)
Medical screening examination/treatment/procedure(s) were performed by non-physician practitioner and as supervising physician I was immediately available for consultation/collaboration.   Richardean Canal, MD 08/30/12 [phone]

## 2012-09-07 ENCOUNTER — Ambulatory Visit (INDEPENDENT_AMBULATORY_CARE_PROVIDER_SITE_OTHER): Payer: Medicaid Other | Admitting: Family Medicine

## 2012-09-07 ENCOUNTER — Encounter: Payer: Self-pay | Admitting: Family Medicine

## 2012-09-07 VITALS — BP 181/100 | HR 76 | Temp 99.3°F | Wt 235.0 lb

## 2012-09-07 DIAGNOSIS — M549 Dorsalgia, unspecified: Secondary | ICD-10-CM

## 2012-09-07 DIAGNOSIS — N529 Male erectile dysfunction, unspecified: Secondary | ICD-10-CM

## 2012-09-07 DIAGNOSIS — I10 Essential (primary) hypertension: Secondary | ICD-10-CM

## 2012-09-07 DIAGNOSIS — M1710 Unilateral primary osteoarthritis, unspecified knee: Secondary | ICD-10-CM

## 2012-09-07 DIAGNOSIS — M169 Osteoarthritis of hip, unspecified: Secondary | ICD-10-CM

## 2012-09-07 MED ORDER — TRAMADOL HCL 50 MG PO TABS: 50.0000 mg | ORAL_TABLET | Freq: Three times a day (TID) | ORAL | Status: DC | PRN

## 2012-09-07 MED ORDER — GABAPENTIN 600 MG PO TABS: 600.0000 mg | ORAL_TABLET | Freq: Three times a day (TID) | ORAL | Status: DC | PRN

## 2012-09-07 MED ORDER — METOPROLOL SUCCINATE ER 25 MG PO TB24: 25.0000 mg | ORAL_TABLET | Freq: Every day | ORAL | Status: DC

## 2012-09-07 MED ORDER — TADALAFIL 20 MG PO TABS: 10.0000 mg | ORAL_TABLET | ORAL | Status: DC | PRN

## 2012-09-07 NOTE — Assessment & Plan Note (Signed)
Patient requesting Cialis.

## 2012-09-07 NOTE — Progress Notes (Signed)
  Subjective:    Patient ID: Jeffrey [name], male    DOB: October 08, 1955, 57 y.o.   MRN: [id number]  HPI  Chronic pain:  Patient went to [facility] after leaving our practice.  Per patient, he was told there was nothing they could do for him, so he was referred to a Pain specialist.  He is currently waiting for [facility] referral to go through.  In December, he was living in an assisted living facility and the physician there gave him Oxycodone 1 tablet every 12 hours and Percocet PRN for breakthrough pain.  Patient fired that physician and left the facility.  He has been going to ER for refills on Tramadol and oxycodone.  Now lives with a roommate in an apartment.  Hypertension: BP elevated today.  Dr. Parke Simmers prescribed HCTZ and he started to feel like "crap".  Patient dumped out pills.  Patient left the [facility].  He has been out of BP medications December 3rd.  He is adamant about going back on Metoprolol only.  He does not want to try anything else.  Denies any HA, paresthesias, chest pain, or SOB.  Endorses nausea and blurry vision.  He is also asking for Cialis refills for erectile dysfunction.  Review of Systems  Per HPI    Objective:   Physical Exam  Constitutional: No distress.       Has a walker  HENT:  Head: Normocephalic and atraumatic.  Neck: Neck supple.  Cardiovascular: Normal rate, regular rhythm and normal heart sounds.   Pulmonary/Chest: Effort normal. He has no wheezes. He has no rales.  Abdominal: Soft. He exhibits no distension. There is no tenderness. There is no rebound and no guarding.      Assessment & Plan:

## 2012-09-07 NOTE — Assessment & Plan Note (Signed)
Discussed with patient previously that I will not be prescribing narcotics due to alcohol and substance abuse.  Awaiting referral to Pain Clinic per previous PCP at [facility].  Will send Gabapentin and Tramadol to pharmacy.  Return in 6 months or sooner as needed.

## 2012-09-07 NOTE — Assessment & Plan Note (Signed)
BP elevated today, but has been well controlled at previous visits.  Because he does not like hctz, will give Rx Metoprolol 25 daily which was what he was taking before he left our clinic.  Red flags reviewed.

## 2012-09-07 NOTE — Patient Instructions (Addendum)
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 Please pick up Cialis, Gabapentin, Tramadol, and Metoprolol at your pharmacy. Schedule follow up appointment in 6 months or sooner as needed.

## 2012-09-13 ENCOUNTER — Telehealth: Payer: Self-pay | Admitting: Family Medicine

## 2012-09-13 NOTE — Telephone Encounter (Signed)
LVM for patient to call
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
## 2012-09-14 ENCOUNTER — Emergency Department (HOSPITAL_COMMUNITY)
Admission: EM | Admit: 2012-09-14 | Discharge: 2012-09-15 | Disposition: A | Discharge status: Home / Self Care | Payer: MEDICAID | Attending: Emergency Medicine | Admitting: Emergency Medicine

## 2012-09-14 ENCOUNTER — Encounter (HOSPITAL_COMMUNITY): Payer: Self-pay | Admitting: Emergency Medicine

## 2012-09-14 DIAGNOSIS — I10 Essential (primary) hypertension: Secondary | ICD-10-CM | POA: Insufficient documentation

## 2012-09-14 DIAGNOSIS — M549 Dorsalgia, unspecified: Secondary | ICD-10-CM | POA: Insufficient documentation

## 2012-09-14 DIAGNOSIS — Z8739 Personal history of other diseases of the musculoskeletal system and connective tissue: Secondary | ICD-10-CM | POA: Insufficient documentation

## 2012-09-14 DIAGNOSIS — G20 Parkinson's disease: Secondary | ICD-10-CM | POA: Insufficient documentation

## 2012-09-14 DIAGNOSIS — G20A1 Parkinson's disease without dyskinesia, without mention of fluctuations: Secondary | ICD-10-CM | POA: Insufficient documentation

## 2012-09-14 DIAGNOSIS — F329 Major depressive disorder, single episode, unspecified: Secondary | ICD-10-CM

## 2012-09-14 DIAGNOSIS — F1010 Alcohol abuse, uncomplicated: Secondary | ICD-10-CM | POA: Insufficient documentation

## 2012-09-14 DIAGNOSIS — Z8719 Personal history of other diseases of the digestive system: Secondary | ICD-10-CM | POA: Insufficient documentation

## 2012-09-14 DIAGNOSIS — G8929 Other chronic pain: Secondary | ICD-10-CM | POA: Insufficient documentation

## 2012-09-14 DIAGNOSIS — Z79899 Other long term (current) drug therapy: Secondary | ICD-10-CM | POA: Insufficient documentation

## 2012-09-14 DIAGNOSIS — R45851 Suicidal ideations: Secondary | ICD-10-CM | POA: Insufficient documentation

## 2012-09-14 DIAGNOSIS — F3289 Other specified depressive episodes: Secondary | ICD-10-CM | POA: Insufficient documentation

## 2012-09-14 DIAGNOSIS — F17200 Nicotine dependence, unspecified, uncomplicated: Secondary | ICD-10-CM | POA: Insufficient documentation

## 2012-09-14 LAB — COMPREHENSIVE METABOLIC PANEL
AST: 18 U/L (ref 0–37)
BUN: 18 mg/dL (ref 6–23)
CO2: 21 mEq/L (ref 19–32)
Calcium: 9 mg/dL (ref 8.4–10.5)
Creatinine, Ser: 0.89 mg/dL (ref 0.50–1.35)
GFR calc non Af Amer: >90 mL/min (ref >90)

## 2012-09-14 LAB — CBC WITH DIFFERENTIAL/PLATELET
Basophils Absolute: 0 10*3/uL (ref 0.0–0.1)
Basophils Relative: 0 % (ref 0–1)
Eosinophils Relative: 2 % (ref 0–5)
HCT: 48.9 % (ref 39.0–52.0)
Lymphocytes Relative: 26 % (ref 12–46)
MCHC: 33.7 g/dL (ref 30.0–36.0)
MCV: 90.6 fL (ref 78.0–100.0)
Monocytes Absolute: 0.8 10*3/uL (ref 0.1–1.0)
RDW: 13.7 % (ref 11.5–15.5)

## 2012-09-14 LAB — ETHANOL: Alcohol, Ethyl (B): 194 mg/dL — ABNORMAL HIGH (ref 0–11)

## 2012-09-14 MED ORDER — ZIPRASIDONE MESYLATE 20 MG IM SOLR
20.0000 mg | Freq: Once | INTRAMUSCULAR | Status: AC
  Administered 2012-09-14: 20 mg via INTRAMUSCULAR
  Filled 2012-09-14: qty 20

## 2012-09-14 MED ORDER — THIAMINE HCL 100 MG/ML IJ SOLN: 100.0000 mg | Freq: Every day | INTRAMUSCULAR | Status: DC

## 2012-09-14 MED ORDER — NICOTINE 21 MG/24HR TD PT24
21.0000 mg | MEDICATED_PATCH | Freq: Once | TRANSDERMAL | Status: AC
  Administered 2012-09-14 – 2012-09-15 (×2): 21 mg via TRANSDERMAL
  Filled 2012-09-14 (×2): qty 1

## 2012-09-14 MED ORDER — VITAMIN B-1 100 MG PO TABS
100.0000 mg | ORAL_TABLET | Freq: Every day | ORAL | Status: DC
  Administered 2012-09-15: 100 mg via ORAL
  Filled 2012-09-14: qty 1

## 2012-09-14 MED ORDER — FOLIC ACID 1 MG PO TABS
1.0000 mg | ORAL_TABLET | Freq: Every day | ORAL | Status: DC
  Administered 2012-09-15: 1 mg via ORAL
  Filled 2012-09-14: qty 1

## 2012-09-14 MED ORDER — ACETAMINOPHEN 500 MG PO TABS
500.0000 mg | ORAL_TABLET | Freq: Four times a day (QID) | ORAL | Status: DC | PRN
  Administered 2012-09-14 – 2012-09-15 (×2): 500 mg via ORAL
  Filled 2012-09-14 (×2): qty 1

## 2012-09-14 MED ORDER — LORAZEPAM 1 MG PO TABS
1.0000 mg | ORAL_TABLET | Freq: Four times a day (QID) | ORAL | Status: DC | PRN
  Administered 2012-09-14 (×2): 1 mg via ORAL
  Filled 2012-09-14 (×2): qty 1

## 2012-09-14 MED ORDER — IBUPROFEN 200 MG PO TABS
600.0000 mg | ORAL_TABLET | Freq: Three times a day (TID) | ORAL | Status: DC | PRN
  Administered 2012-09-14 – 2012-09-15 (×2): 600 mg via ORAL
  Filled 2012-09-14 (×2): qty 3

## 2012-09-14 MED ORDER — LORAZEPAM 2 MG/ML IJ SOLN: 1.0000 mg | Freq: Four times a day (QID) | INTRAMUSCULAR | Status: DC | PRN

## 2012-09-14 MED ORDER — ACETAMINOPHEN 325 MG PO TABS: 650.0000 mg | ORAL_TABLET | ORAL | Status: DC | PRN

## 2012-09-14 MED ORDER — ADULT MULTIVITAMIN W/MINERALS CH
1.0000 | ORAL_TABLET | Freq: Every day | ORAL | Status: DC
  Administered 2012-09-15: 1 via ORAL
  Filled 2012-09-14: qty 1

## 2012-09-14 MED ORDER — LORAZEPAM 1 MG PO TABS: 1.0000 mg | ORAL_TABLET | Freq: Three times a day (TID) | ORAL | Status: DC | PRN

## 2012-09-14 NOTE — ED Notes (Signed)
ACT at bedside, pt being belligerent, screaming and cussing at staff. Pt is getting out of the bed, yelling and stating he will leave. TEFL teacher present in the room; pt helped back to bed.

## 2012-09-14 NOTE — ED Provider Notes (Signed)
Patient has been belligerent and yelling. States that he needs pain medications. He has been telling people that he is suicidal. Dr. Shela Commons. cannot see the patient. Patient will be given some sedation due to his acting out. Will get telepsych.   Juliet Rude. Rubin Payor, MD 09/14/12 [phone]
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
## 2012-09-14 NOTE — ED Notes (Signed)
[employer] in ED to bring ivc papers. Papers signed per [employer] and placed in charg.

## 2012-09-14 NOTE — ED Notes (Signed)
Received pt in rm 27. Pt is asleep, when asked questions pt mumbles "leave me alone".

## 2012-09-14 NOTE — ED Notes (Signed)
Pt getting out of bed, crying stating "Just give me one big pill to kill me now". Pt asks for cigarettes, sts "I chew them anyhow, why can't I have it". EDP notified, order received for nicotine patch. Pt given ibuprofen for headache and ativan for agitation. Sitter at bedside.

## 2012-09-14 NOTE — ED Notes (Addendum)
PER EMS- pt picked up from group home with c/o alcohol intoxication and suicidal ideation.  Pt also c/o suicidal thoughts and bilateral leg pain.  Pt reported to EMS that he "drank some beer."  EMS also reports that pt is uncooperative at times.

## 2012-09-14 NOTE — ED Notes (Signed)
Pt stated that he wanted to sign out ama. Informed MD pickering and stated that he wanted to IVC pt. IVC papers given to MD and awaiting him to fill out paper work. Will continue to monitor pt status.

## 2012-09-14 NOTE — ED Notes (Addendum)
Pt keeps screaming and asking for more pain medication, pt sts "I don't want f.ing ibuprofen" Pt is cussing at the staff, security paged. Per sitter pt put nicotine patch in his mouth, and placed it back on his arm. Dr Rubin Payor asked if pt can have anything else for pain, dr Rubin Payor stated psychiatrist is to see pt first. Pt explained that psychiatrist will be in to examine him, however pt is still yelling and cussing. Security at bedside.

## 2012-09-14 NOTE — ED Notes (Signed)
Pt keeps yelling
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
## 2012-09-14 NOTE — ED Notes (Signed)
[id number]<AV> Expected date:<BR> Expected time:<BR> Means of arrival:<BR> Comments:<BR> Psych/etoh
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
## 2012-09-14 NOTE — ED Notes (Signed)
ivc papers notorized, faxed to magistrate office and RN called magistrate to assure that papers were received. Pt in bed with sitter at bedside will continue to monitor.

## 2012-09-15 DIAGNOSIS — F1910 Other psychoactive substance abuse, uncomplicated: Secondary | ICD-10-CM

## 2012-09-15 DIAGNOSIS — F3289 Other specified depressive episodes: Secondary | ICD-10-CM

## 2012-09-15 DIAGNOSIS — G894 Chronic pain syndrome: Secondary | ICD-10-CM

## 2012-09-15 DIAGNOSIS — F329 Major depressive disorder, single episode, unspecified: Secondary | ICD-10-CM

## 2012-09-15 LAB — RAPID URINE DRUG SCREEN, HOSP PERFORMED
Cocaine: NOT DETECTED
Opiates: NOT DETECTED
Tetrahydrocannabinol: POSITIVE — AB

## 2012-09-15 MED ORDER — TRAMADOL HCL 50 MG PO TABS
50.0000 mg | ORAL_TABLET | Freq: Once | ORAL | Status: AC
  Administered 2012-09-15: 50 mg via ORAL
  Filled 2012-09-15: qty 1
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
## 2012-09-15 NOTE — ED Notes (Signed)
Psychiatrist at bedside

## 2012-09-15 NOTE — Discharge Instructions (Signed)

## 2012-09-15 NOTE — ED Notes (Signed)
Pt woke up c/o pain. Will give pt Tylenol per orders.

## 2012-09-15 NOTE — ED Provider Notes (Signed)
Patient seen and assessed. Still depressed due to his chronic medical problems. Complains of back pain. Patient given Ultram. Reviewing the records revealed that [facility] was concerned about the patient's general medical condition because he is in a wheelchair. I did discuss this with the patient and although he uses a wheelchair to get around, patient reports he can ambulate on his own. His wheelchair is for comfort because of his chronic pain. I did not feel, therefore, that this would prohibit him from being hospitalized. This was discussed with Shelda Jakes, PA at behavioral health. She is reconsidering the patient's case at this time.  Gilda Crease, MD 09/15/12 [phone]

## 2012-09-15 NOTE — BHH Counselor (Deleted)
Pt is assessed by Dr. Elsie Saas and determined to not have any psychosis, HI, or SI. Pt does not meet criteria for inpatient admission and has been provided referral information to [facility] for medication maintenance.  Ranae Pila, MS, LCAS

## 2012-09-15 NOTE — BHH Counselor (Signed)
Pt is assessed by Dr. Jonnalagadda and determined to not have any psychosis, HI, or SI. Pt does not meet criteria for inpatient admission and has been provided referral information to [facility] for medication maintenance.  Izzy Doubek Nickens-Silva, MS, LCAS   

## 2012-09-23 ENCOUNTER — Emergency Department (HOSPITAL_COMMUNITY)
Admission: EM | Admit: 2012-09-23 | Discharge: 2012-09-23 | Disposition: A | Discharge status: Home / Self Care | Payer: Medicaid Other | Attending: Emergency Medicine | Admitting: Emergency Medicine

## 2012-09-23 ENCOUNTER — Encounter (HOSPITAL_COMMUNITY): Payer: Self-pay | Admitting: Emergency Medicine

## 2012-09-23 DIAGNOSIS — F329 Major depressive disorder, single episode, unspecified: Secondary | ICD-10-CM | POA: Insufficient documentation

## 2012-09-23 DIAGNOSIS — G20A1 Parkinson's disease without dyskinesia, without mention of fluctuations: Secondary | ICD-10-CM | POA: Insufficient documentation

## 2012-09-23 DIAGNOSIS — Z79899 Other long term (current) drug therapy: Secondary | ICD-10-CM | POA: Insufficient documentation

## 2012-09-23 DIAGNOSIS — Z8619 Personal history of other infectious and parasitic diseases: Secondary | ICD-10-CM | POA: Insufficient documentation

## 2012-09-23 DIAGNOSIS — I10 Essential (primary) hypertension: Secondary | ICD-10-CM | POA: Insufficient documentation

## 2012-09-23 DIAGNOSIS — F489 Nonpsychotic mental disorder, unspecified: Secondary | ICD-10-CM | POA: Insufficient documentation

## 2012-09-23 DIAGNOSIS — G8929 Other chronic pain: Secondary | ICD-10-CM | POA: Insufficient documentation

## 2012-09-23 DIAGNOSIS — M545 Low back pain, unspecified: Secondary | ICD-10-CM | POA: Insufficient documentation

## 2012-09-23 DIAGNOSIS — Z9889 Other specified postprocedural states: Secondary | ICD-10-CM | POA: Insufficient documentation

## 2012-09-23 DIAGNOSIS — M25569 Pain in unspecified knee: Secondary | ICD-10-CM | POA: Insufficient documentation

## 2012-09-23 DIAGNOSIS — F3289 Other specified depressive episodes: Secondary | ICD-10-CM | POA: Insufficient documentation

## 2012-09-23 DIAGNOSIS — F17200 Nicotine dependence, unspecified, uncomplicated: Secondary | ICD-10-CM | POA: Insufficient documentation

## 2012-09-23 DIAGNOSIS — G20 Parkinson's disease: Secondary | ICD-10-CM | POA: Insufficient documentation

## 2012-09-23 DIAGNOSIS — M726 Necrotizing fasciitis: Secondary | ICD-10-CM | POA: Insufficient documentation

## 2012-09-23 DIAGNOSIS — IMO0002 Reserved for concepts with insufficient information to code with codable children: Secondary | ICD-10-CM | POA: Insufficient documentation

## 2012-09-23 MED ORDER — OXYCODONE-ACETAMINOPHEN 5-325 MG PO TABS
2.0000 | ORAL_TABLET | Freq: Once | ORAL | Status: AC
  Administered 2012-09-23: 2 via ORAL
  Filled 2012-09-23: qty 2

## 2012-09-23 MED ORDER — TRAMADOL HCL 50 MG PO TABS
50.0000 mg | ORAL_TABLET | Freq: Once | ORAL | Status: AC
  Administered 2012-09-23: 50 mg via ORAL
  Filled 2012-09-23: qty 1

## 2012-09-23 MED ORDER — TRAMADOL HCL 50 MG PO TABS: 50.0000 mg | ORAL_TABLET | Freq: Four times a day (QID) | ORAL | Status: DC | PRN

## 2012-09-23 NOTE — ED Provider Notes (Signed)
Medical screening examination/treatment/procedure(s) were performed by non-physician practitioner and as supervising physician I was immediately available for consultation/collaboration.  Juliet Rude. Rubin Payor, MD 09/23/12 [phone]

## 2012-09-23 NOTE — ED Notes (Addendum)
Per EMS.  Pt has degenerative disc disease.  Pt states he is out of pain medication.  Pt wheelchair bound.  Pt has pain has lower back pain and bilateral leg pain.  130/80, hr 76, rr 16
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
## 2012-10-02 ENCOUNTER — Telehealth: Payer: Self-pay | Admitting: Family Medicine

## 2012-10-02 NOTE — Telephone Encounter (Signed)
[facility]- [address]  Needs for [place] to send script for him to get Depends - so that Medicaid will pay for it. Ciallis is for his incontinence not for ED - needs this also

## 2012-10-03 MED ORDER — TADALAFIL 5 MG PO TABS: 5.0000 mg | ORAL_TABLET | Freq: Every day | ORAL | Status: DC | PRN

## 2012-10-03 NOTE — Telephone Encounter (Signed)
Cialis sent to pharmacy.  Thanks Huntley Dec.

## 2012-10-04 NOTE — Telephone Encounter (Signed)
LVM for patient to call back to inform him that the medication requested was sent to the pharmacy

## 2012-10-09 ENCOUNTER — Telehealth: Payer: Self-pay | Admitting: *Deleted

## 2012-10-09 NOTE — Telephone Encounter (Signed)
PA required for Cialis. Form placed in Dr. Sherron Flemings Cruz's box.

## 2012-10-16 NOTE — Telephone Encounter (Addendum)
Dr. Tye Savoy unable to complete info for PA due to patient not having sufficient medical diagnosis to support request for medication approval from [facility].  Patient will need office visit to discuss reason for Cialis.  Left message to call our office back.  Gaylene Brooks, RN  Patient called back and informed that we are unable to complete request for Medicaid to approve Cialis.  Patient states he had free 30-day supply of Cialis and "it really helped my incontinence."  Patient will need office visit to discuss need/diagnosis for Cialis approval.   Appt scheduled with Dr. Tye Savoy for 10/18/12 at 2:00 pm.  Gaylene Brooks, RN

## 2012-10-16 NOTE — Telephone Encounter (Signed)
Patient is calling to check on the status of PA for Cialis.

## 2012-10-18 ENCOUNTER — Ambulatory Visit (INDEPENDENT_AMBULATORY_CARE_PROVIDER_SITE_OTHER): Payer: Medicaid Other | Admitting: Family Medicine

## 2012-10-18 ENCOUNTER — Encounter: Payer: Self-pay | Admitting: Family Medicine

## 2012-10-18 VITALS — BP 135/82 | HR 65 | Temp 97.8°F

## 2012-10-18 DIAGNOSIS — M25519 Pain in unspecified shoulder: Secondary | ICD-10-CM

## 2012-10-18 DIAGNOSIS — M25512 Pain in left shoulder: Secondary | ICD-10-CM

## 2012-10-18 DIAGNOSIS — R32 Unspecified urinary incontinence: Secondary | ICD-10-CM | POA: Insufficient documentation

## 2012-10-18 MED ORDER — OXYBUTYNIN CHLORIDE ER 10 MG PO TB24: 10.0000 mg | ORAL_TABLET | Freq: Every day | ORAL | Status: DC

## 2012-10-18 MED ORDER — KETOROLAC TROMETHAMINE 60 MG/2ML IM SOLN
60.0000 mg | Freq: Once | INTRAMUSCULAR | Status: AC
  Administered 2012-10-18: 60 mg via INTRAMUSCULAR

## 2012-10-18 NOTE — Patient Instructions (Addendum)
For urinary incontinence, start taking Ditropan once per day for one month and see if it works. If not, I will refer you to a bladder specialist. Schedule follow up appointment with me as needed.  Urinary Incontinence Your doctor wants you to have this information about urinary incontinence. This is the inability to keep urine in your body until you decide to release it. CAUSES  Prostate gland enlargement is a common cause of urinary incontinence. But there are many different causes for losing urinary control. They include:  Medicines.  Infections.  Prostate problems.  Surgery.  Neurological diseases.  Emotional factors. DIAGNOSIS  Evaluating the cause of incontinence is important in choosing the best treatment. This may require:  An ultrasound exam.  Kidney and bladder X-rays.  Cystoscopy. This is an exam of the bladder using a narrow scope. TREATMENT  For incontinent patients, normal daily hygiene and using changing pads or adult diapers regularly will prevent offensive odors and skin damage from the moisture. Changing your medicines may help control incontinence. Your caregiver may prescribe some medicines to help you regain control. Avoid caffeine. It can over-stimulate the bladder. Use the bathroom regularly. Try about every 2 to 3 hours even if you do not feel the need. Take time to empty your bladder completely. After urinating, wait a minute. Then try to urinate again. External devices used to catch urine or an indwelling urine catheter (Foley catheter) may be needed as well. Some prostate gland problems require surgery to correct. Call your caregiver for more information. Document Released: 09/30/2004 Document Revised: 11/15/2011 Document Reviewed: 09/25/2008 [facility] Patient Information 2013 [place], [place].

## 2012-10-18 NOTE — Assessment & Plan Note (Signed)
Likely urge incontinences vs. Neurogenic bladder.  Will start with Ditropan 10 XL daily and see if this helps.  If not improvement, will consider referral to Urology due to complex medical history.

## 2012-10-18 NOTE — Progress Notes (Signed)
  Subjective:    Patient ID: Jeffrey [name], male    DOB: 26-Jan-1956, 57 y.o.   MRN: [id number]  HPI  Problems with urinary incontinence.  He says he goes to bathroom about 20-30 times per day.  He used to take Cialis (30 day free trial), which helped improve symptoms and has requested refills but denied by Medicare.Marland Kitchen  He needs something else for incontinence, but cannot afford Cialis.  Patient says he has a strong to urge to urinate, but does not have any leakage with sneezing or coughing.  He does not want to give [place] a urine specimen at this time.  He says "if I had a UTI I would tell you."  Denies any fever, chills, NS, nausea/vomiting, or bowel incontinence.   Review of Systems Per HPI    Objective:   Physical Exam  Constitutional: He appears well-nourished. No distress.  Abdominal: Soft. He exhibits no distension. There is no tenderness. There is no rebound.  Musculoskeletal:  Wheelchair bound      Assessment & Plan:

## 2012-10-23 ENCOUNTER — Telehealth: Payer: Self-pay | Admitting: *Deleted

## 2012-10-23 NOTE — Telephone Encounter (Signed)
PA required for oxybutynin. Form placed in Dr.de la Cruz's box.

## 2012-10-24 ENCOUNTER — Encounter (HOSPITAL_COMMUNITY): Payer: Self-pay | Admitting: Emergency Medicine

## 2012-10-24 ENCOUNTER — Emergency Department (HOSPITAL_COMMUNITY)
Admission: EM | Admit: 2012-10-24 | Discharge: 2012-10-25 | Disposition: A | Discharge status: Home / Self Care | Payer: Medicaid Other | Attending: Emergency Medicine | Admitting: Emergency Medicine

## 2012-10-24 ENCOUNTER — Telehealth: Payer: Self-pay | Admitting: Family Medicine

## 2012-10-24 DIAGNOSIS — G8929 Other chronic pain: Secondary | ICD-10-CM | POA: Insufficient documentation

## 2012-10-24 DIAGNOSIS — I10 Essential (primary) hypertension: Secondary | ICD-10-CM | POA: Insufficient documentation

## 2012-10-24 DIAGNOSIS — F3289 Other specified depressive episodes: Secondary | ICD-10-CM | POA: Insufficient documentation

## 2012-10-24 DIAGNOSIS — Z8619 Personal history of other infectious and parasitic diseases: Secondary | ICD-10-CM | POA: Insufficient documentation

## 2012-10-24 DIAGNOSIS — M545 Low back pain, unspecified: Secondary | ICD-10-CM | POA: Insufficient documentation

## 2012-10-24 DIAGNOSIS — G20 Parkinson's disease: Secondary | ICD-10-CM | POA: Insufficient documentation

## 2012-10-24 DIAGNOSIS — F17200 Nicotine dependence, unspecified, uncomplicated: Secondary | ICD-10-CM | POA: Insufficient documentation

## 2012-10-24 DIAGNOSIS — F329 Major depressive disorder, single episode, unspecified: Secondary | ICD-10-CM | POA: Insufficient documentation

## 2012-10-24 DIAGNOSIS — G20A1 Parkinson's disease without dyskinesia, without mention of fluctuations: Secondary | ICD-10-CM | POA: Insufficient documentation

## 2012-10-24 DIAGNOSIS — Z8739 Personal history of other diseases of the musculoskeletal system and connective tissue: Secondary | ICD-10-CM | POA: Insufficient documentation

## 2012-10-24 MED ORDER — OXYBUTYNIN CHLORIDE 5 MG PO TABS: 5.0000 mg | ORAL_TABLET | Freq: Three times a day (TID) | ORAL | Status: DC

## 2012-10-24 NOTE — Telephone Encounter (Signed)
Will send generic ditropan to pharmacy and hope it is approved.

## 2012-10-24 NOTE — Telephone Encounter (Signed)
Called pharmacy  and cancelled RX for oxybutynin ER.

## 2012-10-24 NOTE — ED Notes (Signed)
Per [facility], patient complaining of left knee pain.  Reports history of chronic knee pain.  History of arthritis.  Patient requesting address and phone number of methadone clinic; patient was taken off his pain medication.  Patient unable to put weight on left knee; denies recent injury.

## 2012-10-25 MED ORDER — OXYCODONE-ACETAMINOPHEN 5-325 MG PO TABS
1.0000 | ORAL_TABLET | Freq: Once | ORAL | Status: DC
  Filled 2012-10-25 (×2): qty 1

## 2012-10-25 MED ORDER — OXYCODONE-ACETAMINOPHEN 5-325 MG PO TABS
2.0000 | ORAL_TABLET | Freq: Once | ORAL | Status: AC
  Administered 2012-10-25: 2 via ORAL

## 2012-10-25 NOTE — ED Provider Notes (Signed)
Medical screening examination/treatment/procedure(s) were performed by non-physician practitioner and as supervising physician I was immediately available for consultation/collaboration.  Enis Riecke K Arav Bannister, MD 10/25/12 0448 

## 2012-10-25 NOTE — ED Notes (Signed)
MD at bedside. 

## 2012-10-25 NOTE — ED Provider Notes (Signed)
History     CSN: [id number]  Arrival date & time 10/24/12  2317   First MD Initiated Contact with Patient 10/25/12 0054      Chief Complaint  Patient presents with  . Knee Pain    (Consider location/radiation/quality/duration/timing/severity/associated sxs/prior treatment) HPI Jeffrey [name] is a 57 y.o. male who presents to the Emergency Department complaining of chronic lower back pain that is worse than normal by EMS. He comes to the ED frequently for this pain. He has a hx of necrotizing fasciitis in his left leg that has left him with significant nerve pain. He takes neurontin and ultram for the pain. He said his electric wheel chair battery is starting to die and he has been having to exert more effort than normal. He is also experiencing bilateral leg pain. He has no new injury and no new sx. Pt lives with roommates in assisted living. Pt currently does not have a PCP. Pt reports he has had difficulty finding a PCP for these sx. Pt has degenerative disc disease.     Past Medical History  Diagnosis Date  . DDD (degenerative disc disease)   . Necrotizing fasciitis   . Degenerative disc disease   . Hypertension   . Degenerative disc disease   . Hepatitis C   . Chronic pain   . DDD (degenerative disc disease)   . Parkinson disease   . Mental disorder   . Depression   . DDD (degenerative disc disease)     Past Surgical History  Procedure Laterality Date  . Hip surgery    . Appendectomy    . Ankle surgery      History reviewed. No pertinent family history.  History  Substance Use Topics  . Smoking status: Current Every Day Smoker -- 1.00 packs/day for 36 years    Types: Cigarettes  . Smokeless tobacco: Former Neurosurgeon    Types: Chew  . Alcohol Use: Yes      Review of Systems  Review of Systems  Constitutional: Negative for chills.  Respiratory: Negative for shortness of breath.  Gastrointestinal: Negative for nausea and vomiting.  Musculoskeletal: Positive  for arthralgias (bilateral leg pain).  All other systems reviewed and are negative.   Allergies  Review of patient's allergies indicates no known allergies.  Home Medications   Current Outpatient Rx  Name  Route  Sig  Dispense  Refill  . gabapentin (NEURONTIN) 600 MG tablet   Oral   Take 1 tablet (600 mg total) by mouth 3 (three) times daily as needed.   90 tablet   6   . metoprolol succinate (TOPROL-XL) 25 MG 24 hr tablet   Oral   Take 1 tablet (25 mg total) by mouth daily.   30 tablet   6   . oxybutynin (DITROPAN) 5 MG tablet   Oral   Take 1 tablet (5 mg total) by mouth 3 (three) times daily.   90 tablet   1   . traMADol (ULTRAM) 50 MG tablet   Oral   Take 1 tablet (50 mg total) by mouth every 8 (eight) hours as needed for pain.   90 tablet   6     BP 138/94  Pulse 74  Temp(Src) 97.9 F (36.6 C) (Oral)  Resp 19  SpO2 97%  Physical Exam Nursing note and vitals reviewed.  Constitutional: He is oriented to person, place, and time. He appears well-developed and well-nourished. No distress.  Pt is tearful upon examination.  HENT:  Head:  Normocephalic and atraumatic.  Eyes: EOM are normal.  Neck: Neck supple. No tracheal deviation present.  Cardiovascular: Normal rate.  Pulmonary/Chest: Effort normal. No respiratory distress.  Musculoskeletal: Normal range of motion.  Pt is wheel chair bound and has decreased strenght of ROM and strength which he describes is at his baseline. No sign of bruising, trauma or infection over skin. Neurological: He is alert and oriented to person, place, and time.  Skin: Skin is warm and dry.  Psychiatric: He has a normal mood and affect. His behavior is normal.    ED Course  Procedures (including critical care time)  Labs Reviewed - No data to display No results found.   1. Chronic pain       MDM   Patient with back pain. No new neurological deficits. No warning symptoms of back pain including: loss of bowel or  bladder control, night sweats, waking from sleep with back pain, unexplained fevers or weight loss, h/o cancer, IVDU, recent trauma. No concern for cauda equina, epidural abscess, or other serious cause of back pain. Conservative measures such as rest, ice/heat and pain medicine indicated with PCP referrals given to pain managment  2 percocets given in ED and Tramadol Rx        Dorthula Matas, PA 10/25/12 0109

## 2012-11-01 ENCOUNTER — Emergency Department (HOSPITAL_COMMUNITY)
Admission: EM | Admit: 2012-11-01 | Discharge: 2012-11-01 | Disposition: A | Discharge status: Home / Self Care | Payer: Medicaid Other | Attending: Emergency Medicine | Admitting: Emergency Medicine

## 2012-11-01 ENCOUNTER — Telehealth: Payer: Self-pay | Admitting: Family Medicine

## 2012-11-01 ENCOUNTER — Encounter (HOSPITAL_COMMUNITY): Payer: Self-pay | Admitting: Emergency Medicine

## 2012-11-01 DIAGNOSIS — F329 Major depressive disorder, single episode, unspecified: Secondary | ICD-10-CM | POA: Insufficient documentation

## 2012-11-01 DIAGNOSIS — Z8739 Personal history of other diseases of the musculoskeletal system and connective tissue: Secondary | ICD-10-CM | POA: Insufficient documentation

## 2012-11-01 DIAGNOSIS — Y929 Unspecified place or not applicable: Secondary | ICD-10-CM | POA: Insufficient documentation

## 2012-11-01 DIAGNOSIS — S99929A Unspecified injury of unspecified foot, initial encounter: Secondary | ICD-10-CM | POA: Insufficient documentation

## 2012-11-01 DIAGNOSIS — F17200 Nicotine dependence, unspecified, uncomplicated: Secondary | ICD-10-CM | POA: Insufficient documentation

## 2012-11-01 DIAGNOSIS — G20A1 Parkinson's disease without dyskinesia, without mention of fluctuations: Secondary | ICD-10-CM | POA: Insufficient documentation

## 2012-11-01 DIAGNOSIS — F3289 Other specified depressive episodes: Secondary | ICD-10-CM | POA: Insufficient documentation

## 2012-11-01 DIAGNOSIS — W050XXA Fall from non-moving wheelchair, initial encounter: Secondary | ICD-10-CM | POA: Insufficient documentation

## 2012-11-01 DIAGNOSIS — I10 Essential (primary) hypertension: Secondary | ICD-10-CM | POA: Insufficient documentation

## 2012-11-01 DIAGNOSIS — Y9389 Activity, other specified: Secondary | ICD-10-CM | POA: Insufficient documentation

## 2012-11-01 DIAGNOSIS — G20 Parkinson's disease: Secondary | ICD-10-CM | POA: Insufficient documentation

## 2012-11-01 DIAGNOSIS — Z8619 Personal history of other infectious and parasitic diseases: Secondary | ICD-10-CM | POA: Insufficient documentation

## 2012-11-01 DIAGNOSIS — G8929 Other chronic pain: Secondary | ICD-10-CM

## 2012-11-01 DIAGNOSIS — S8990XA Unspecified injury of unspecified lower leg, initial encounter: Secondary | ICD-10-CM | POA: Insufficient documentation

## 2012-11-01 DIAGNOSIS — Z8659 Personal history of other mental and behavioral disorders: Secondary | ICD-10-CM | POA: Insufficient documentation

## 2012-11-01 DIAGNOSIS — Z79899 Other long term (current) drug therapy: Secondary | ICD-10-CM | POA: Insufficient documentation

## 2012-11-01 LAB — RAPID URINE DRUG SCREEN, HOSP PERFORMED
Amphetamines: NOT DETECTED
Barbiturates: NOT DETECTED
Benzodiazepines: NOT DETECTED
Cocaine: POSITIVE — AB
Opiates: NOT DETECTED
Tetrahydrocannabinol: NOT DETECTED

## 2012-11-01 LAB — URINALYSIS, ROUTINE W REFLEX MICROSCOPIC
Bilirubin Urine: NEGATIVE
Glucose, UA: NEGATIVE mg/dL
Specific Gravity, Urine: 1.007 (ref 1.005–1.030)
Urobilinogen, UA: 0.2 mg/dL (ref 0.0–1.0)

## 2012-11-01 LAB — URINE MICROSCOPIC-ADD ON

## 2012-11-01 MED ORDER — TRAMADOL HCL 50 MG PO TABS: 50.0000 mg | ORAL_TABLET | Freq: Four times a day (QID) | ORAL | Status: DC | PRN

## 2012-11-01 MED ORDER — IBUPROFEN 400 MG PO TABS: 800.0000 mg | ORAL_TABLET | Freq: Once | ORAL | Status: DC

## 2012-11-01 MED ORDER — OXYCODONE-ACETAMINOPHEN 5-325 MG PO TABS
2.0000 | ORAL_TABLET | Freq: Once | ORAL | Status: AC
  Administered 2012-11-01: 2 via ORAL
  Filled 2012-11-01: qty 2

## 2012-11-01 NOTE — Telephone Encounter (Signed)
Patient is calling for a Pain Management that is first on the list.  Not the one across from [facility].  He says he is severely suffering.

## 2012-11-01 NOTE — ED Notes (Signed)
[facility] contacted to transport pt back home.

## 2012-11-01 NOTE — ED Notes (Signed)
Called EMS patient has chronic right shoulder pain and multiple fall today from transferring from toilet to wheelchair.  Patient drank half of a 40 oz beer today. Pain 8/10 sharp

## 2012-11-01 NOTE — ED Notes (Signed)
Pt given urinal to use.

## 2012-11-01 NOTE — ED Provider Notes (Signed)
History     CSN: [id number]  Arrival date & time 11/01/12  1143   First MD Initiated Contact with Patient 11/01/12 1202      Chief Complaint  Patient presents with  . Fall    (Consider location/radiation/quality/duration/timing/severity/associated sxs/prior treatment) HPI Comments: Pt is here for a chronic pain of his right shoulder and left knee. Pt has taken no interventions. States more painful with movement. Pt states he fell out of his wheelchair today transferring to the toilet. Denies hitting head, LOC, nausea, vomiting. States he is waiting to get in touch with regular doctor who he claims won't see him for several weeks. Pt would like help managing his pain. States pain doctor will not take him as a patient.  Pt is very contradictory. States right shoulder hurts, but then moves it fine and complains about left shoulder. Similar with knees. States left knee is in pain, but moved it fine and screamed when I moved the right knee to assess range of motion.  Saw triage note where he says he drank beer today, but denied any alcohol intake to me.    Patient is a 57 y.o. male presenting with fall.  Fall Pertinent negatives include no fever, no numbness, no nausea, no vomiting and no headaches.    Past Medical History  Diagnosis Date  . DDD (degenerative disc disease)   . Necrotizing fasciitis   . Degenerative disc disease   . Hypertension   . Degenerative disc disease   . Hepatitis C   . Chronic pain   . DDD (degenerative disc disease)   . Parkinson disease   . Mental disorder   . Depression   . DDD (degenerative disc disease)     Past Surgical History  Procedure Laterality Date  . Hip surgery    . Appendectomy    . Ankle surgery      No family history on file.  History  Substance Use Topics  . Smoking status: Current Every Day Smoker -- 1.00 packs/day for 36 years    Types: Cigarettes  . Smokeless tobacco: Former Neurosurgeon    Types: Chew  . Alcohol Use: Yes       Review of Systems  Constitutional: Negative for fever and diaphoresis.  HENT: Negative for neck pain and neck stiffness.   Eyes: Negative for visual disturbance.  Respiratory: Negative for apnea, chest tightness and shortness of breath.   Cardiovascular: Negative for chest pain and palpitations.  Gastrointestinal: Negative for nausea, vomiting, diarrhea and constipation.  Genitourinary: Negative for dysuria.  Musculoskeletal: Positive for arthralgias and gait problem.       Right shoulder pain, left knee pain  Skin:       Necrotizing fasciitis on lower back/buttock   Neurological: Positive for weakness. Negative for dizziness, light-headedness, numbness and headaches.       Baseline bilateral lower extremity weakness, pt is wheelchair bound    Allergies  Review of patient's allergies indicates no known allergies.  Home Medications   Current Outpatient Rx  Name  Route  Sig  Dispense  Refill  . gabapentin (NEURONTIN) 600 MG tablet   Oral   Take 1 tablet (600 mg total) by mouth 3 (three) times daily as needed.   90 tablet   6   . metoprolol succinate (TOPROL-XL) 25 MG 24 hr tablet   Oral   Take 1 tablet (25 mg total) by mouth daily.   30 tablet   6   . traMADol (ULTRAM) 50 MG  tablet   Oral   Take 1 tablet (50 mg total) by mouth every 8 (eight) hours as needed for pain.   90 tablet   6   . oxybutynin (DITROPAN) 5 MG tablet   Oral   Take 1 tablet (5 mg total) by mouth 3 (three) times daily.   90 tablet   1     BP 131/86  Pulse 70  Temp(Src) 98.4 F (36.9 C) (Oral)  Resp 20  SpO2 98%  Physical Exam  Nursing note and vitals reviewed. Constitutional: He is oriented to person, place, and time. He appears well-developed and well-nourished. No distress.  HENT:  Head: Normocephalic and atraumatic.  Eyes: Conjunctivae and EOM are normal.  Neck: Normal range of motion. Neck supple.  No meningeal signs  Cardiovascular: Normal rate, regular rhythm and normal  heart sounds.  Exam reveals no gallop and no friction rub.   No murmur heard. Pulmonary/Chest: Effort normal and breath sounds normal. No respiratory distress. He has no wheezes. He has no rales. He exhibits no tenderness.  Abdominal: Soft. He exhibits no distension. There is no tenderness.  Musculoskeletal: He exhibits tenderness. He exhibits no edema.  Baseline lower extremity weakness. FROM.  Neurological: He is alert and oriented to person, place, and time. No cranial nerve deficit.  No focal deficits.  Skin: Skin is warm and dry. He is not diaphoretic. No erythema.  Necrotizing fasciitis on lower back, buttocks    ED Course  Procedures (including critical care time) Results for orders placed during the hospital encounter of 11/01/12  URINALYSIS, ROUTINE W REFLEX MICROSCOPIC      Result Value Range   Color, Urine YELLOW  YELLOW   APPearance CLEAR  CLEAR   Specific Gravity, Urine 1.007  1.005 - 1.030   pH 6.0  5.0 - 8.0   Glucose, UA NEGATIVE  NEGATIVE mg/dL   Hgb urine dipstick SMALL (*) NEGATIVE   Bilirubin Urine NEGATIVE  NEGATIVE   Ketones, ur NEGATIVE  NEGATIVE mg/dL   Protein, ur 30 (*) NEGATIVE mg/dL   Urobilinogen, UA 0.2  0.0 - 1.0 mg/dL   Nitrite NEGATIVE  NEGATIVE   Leukocytes, UA NEGATIVE  NEGATIVE  URINE RAPID DRUG SCREEN (HOSP PERFORMED)      Result Value Range   Opiates NONE DETECTED  NONE DETECTED   Cocaine POSITIVE (*) NONE DETECTED   Benzodiazepines NONE DETECTED  NONE DETECTED   Amphetamines NONE DETECTED  NONE DETECTED   Tetrahydrocannabinol NONE DETECTED  NONE DETECTED   Barbiturates NONE DETECTED  NONE DETECTED  ETHANOL      Result Value Range   Alcohol, Ethyl (B) 229 (*) 0 - 11 mg/dL  URINE MICROSCOPIC-ADD ON      Result Value Range   Squamous Epithelial / LPF RARE  RARE   WBC, UA 0-2  <3 WBC/hpf   RBC / HPF 0-2  <3 RBC/hpf   Bacteria, UA RARE  RARE    Labs Reviewed - No data to display No results found.   Diagnosis: chronic pain, left  knee, right shoulder    MDM  Will get urine drug screen and blood ETOH to help me assess why pt is so contradictory. Results show positive for cocaine and ETOH, though pt denies use. Treated pt pain in ED. Emphasized importance of finding a primary care doctor to help care for multiple chronic issues.  Though difficult to assess, pt does have FROM of all extremities, no warmth, no erythema, no deformity, no bruising, no  swelling at right shoulder or left knee. At this time there does not appear to be any evidence of an acute emergency medical condition and the patient appears stable for discharge with appropriate outpatient follow up. Diagnosis was discussed with patient who verbalizes understanding and is agreeable to discharge.   Glade Nurse, PA-C 11/01/12 1611

## 2012-11-01 NOTE — Telephone Encounter (Signed)
Left message for a rtn call. Lovinia Snare, Virgel Bouquet

## 2012-11-01 NOTE — ED Provider Notes (Signed)
Medical screening examination/treatment/procedure(s) were performed by non-physician practitioner and as supervising physician I was immediately available for consultation/collaboration.  Flint Melter, MD 11/01/12 [phone]

## 2012-11-01 NOTE — ED Notes (Signed)
Pt states fell three times today trying to get off of toilet. Hx of chronic shoulder pain. Also smells of ETOH. States had to switch from power wheel chair to manual chair and his shoulder has been hurting more since Friday. Patient tearful on assessment.

## 2012-11-03 ENCOUNTER — Telehealth: Payer: Self-pay | Admitting: Family Medicine

## 2012-11-03 NOTE — Telephone Encounter (Signed)
Working on getting his wheelchair repaired and needs to speak to Dr. Tye Savoy.

## 2012-11-05 ENCOUNTER — Encounter (HOSPITAL_COMMUNITY): Payer: Self-pay | Admitting: Emergency Medicine

## 2012-11-05 ENCOUNTER — Emergency Department (HOSPITAL_COMMUNITY)
Admission: EM | Admit: 2012-11-05 | Discharge: 2012-11-06 | Disposition: A | Discharge status: Home / Self Care | Payer: Medicaid Other | Attending: Emergency Medicine | Admitting: Emergency Medicine

## 2012-11-05 DIAGNOSIS — E860 Dehydration: Secondary | ICD-10-CM | POA: Insufficient documentation

## 2012-11-05 DIAGNOSIS — Z8669 Personal history of other diseases of the nervous system and sense organs: Secondary | ICD-10-CM | POA: Insufficient documentation

## 2012-11-05 DIAGNOSIS — Z8619 Personal history of other infectious and parasitic diseases: Secondary | ICD-10-CM | POA: Insufficient documentation

## 2012-11-05 DIAGNOSIS — Z8659 Personal history of other mental and behavioral disorders: Secondary | ICD-10-CM | POA: Insufficient documentation

## 2012-11-05 DIAGNOSIS — G8929 Other chronic pain: Secondary | ICD-10-CM | POA: Insufficient documentation

## 2012-11-05 DIAGNOSIS — K117 Disturbances of salivary secretion: Secondary | ICD-10-CM | POA: Insufficient documentation

## 2012-11-05 DIAGNOSIS — I10 Essential (primary) hypertension: Secondary | ICD-10-CM | POA: Insufficient documentation

## 2012-11-05 DIAGNOSIS — R5381 Other malaise: Secondary | ICD-10-CM | POA: Insufficient documentation

## 2012-11-05 DIAGNOSIS — F17200 Nicotine dependence, unspecified, uncomplicated: Secondary | ICD-10-CM | POA: Insufficient documentation

## 2012-11-05 DIAGNOSIS — Z8739 Personal history of other diseases of the musculoskeletal system and connective tissue: Secondary | ICD-10-CM | POA: Insufficient documentation

## 2012-11-05 DIAGNOSIS — Z79899 Other long term (current) drug therapy: Secondary | ICD-10-CM | POA: Insufficient documentation

## 2012-11-05 DIAGNOSIS — F1010 Alcohol abuse, uncomplicated: Secondary | ICD-10-CM | POA: Insufficient documentation

## 2012-11-05 LAB — CBC WITH DIFFERENTIAL/PLATELET
Eosinophils Absolute: 0.1 10*3/uL (ref 0.0–0.7)
Eosinophils Relative: 1 % (ref 0–5)
HCT: 44.6 % (ref 39.0–52.0)
Hemoglobin: 15.9 g/dL (ref 13.0–17.0)
Lymphocytes Relative: 7 % — ABNORMAL LOW (ref 12–46)
Lymphs Abs: 0.7 10*3/uL (ref 0.7–4.0)
MCH: 30.6 pg (ref 26.0–34.0)
MCV: 85.9 fL (ref 78.0–100.0)
Monocytes Absolute: 0.5 10*3/uL (ref 0.1–1.0)
Monocytes Relative: 6 % (ref 3–12)
Platelets: 192 10*3/uL (ref 150–400)
RBC: 5.19 MIL/uL (ref 4.22–5.81)
WBC: 8.8 10*3/uL (ref 4.0–10.5)

## 2012-11-05 LAB — COMPREHENSIVE METABOLIC PANEL
ALT: 15 U/L (ref 0–53)
BUN: 12 mg/dL (ref 6–23)
CO2: 21 mEq/L (ref 19–32)
Calcium: 8.7 mg/dL (ref 8.4–10.5)
GFR calc Af Amer: >90 mL/min (ref >90)
GFR calc non Af Amer: >90 mL/min (ref >90)
Glucose, Bld: 106 mg/dL — ABNORMAL HIGH (ref 70–99)
Sodium: 134 mEq/L — ABNORMAL LOW (ref 135–145)
Total Protein: 7.1 g/dL (ref 6.0–8.3)

## 2012-11-05 LAB — PROTIME-INR: INR: 0.99 (ref 0.00–1.49)

## 2012-11-05 LAB — ETHANOL: Alcohol, Ethyl (B): <11 mg/dL (ref 0–11)

## 2012-11-05 MED ORDER — TRAMADOL HCL 50 MG PO TABS
50.0000 mg | ORAL_TABLET | Freq: Once | ORAL | Status: AC
  Administered 2012-11-05: 50 mg via ORAL
  Filled 2012-11-05: qty 1

## 2012-11-05 MED ORDER — THIAMINE HCL 100 MG/ML IJ SOLN
100.0000 mg | Freq: Once | INTRAMUSCULAR | Status: AC
  Administered 2012-11-05: 100 mg via INTRAVENOUS
  Filled 2012-11-05: qty 2

## 2012-11-05 MED ORDER — SODIUM CHLORIDE 0.9 % IV BOLUS (SEPSIS)
1000.0000 mL | Freq: Once | INTRAVENOUS | Status: AC
  Administered 2012-11-05: 1000 mL via INTRAVENOUS

## 2012-11-05 NOTE — ED Notes (Signed)
Bed:WHALA<BR> Expected date:<BR> Expected time:<BR> Means of arrival:<BR> Comments:<BR>

## 2012-11-05 NOTE — ED Notes (Signed)
Patient reports scratchy throat. States it hurts to swallow. Patient states he has not eaten solid food in 2 days, and that even swallowing water is hard. Patient immediately follows his complaint with a request for water.

## 2012-11-05 NOTE — ED Provider Notes (Signed)
History     CSN: [id number]  Arrival date & time 11/05/12  2129   First MD Initiated Contact with Patient 11/05/12 2206      Chief Complaint  Patient presents with  . Sore Throat   HPI The patient presents to the emergency room with complaints of sore throat, dry mouth and weakness. He denies any fever. He has not had any coughing or rhinitis For the last couple of days she's had trouble swallowing. Patient has chronic weakness problems and uses a wheelchair at home. He has been feeling progressively weaker.   Patient has a history of alcohol abuse. He has been frequently seen in the emergency department for issues with alcohol trouble and chronic pain issues. Past Medical History  Diagnosis Date  . DDD (degenerative disc disease)   . Necrotizing fasciitis   . Degenerative disc disease   . Hypertension   . Degenerative disc disease   . Hepatitis C   . Chronic pain   . DDD (degenerative disc disease)   . Parkinson disease   . Mental disorder   . Depression   . DDD (degenerative disc disease)     Past Surgical History  Procedure Laterality Date  . Hip surgery    . Appendectomy    . Ankle surgery      History reviewed. No pertinent family history.  History  Substance Use Topics  . Smoking status: Current Every Day Smoker -- 1.00 packs/day for 36 years    Types: Cigarettes  . Smokeless tobacco: Former Neurosurgeon    Types: Chew  . Alcohol Use: Yes      Review of Systems  All other systems reviewed and are negative.    Allergies  Review of patient's allergies indicates no known allergies.  Home Medications   Current Outpatient Rx  Name  Route  Sig  Dispense  Refill  . gabapentin (NEURONTIN) 600 MG tablet   Oral   Take 1 tablet (600 mg total) by mouth 3 (three) times daily as needed.   90 tablet   6   . metoprolol succinate (TOPROL-XL) 25 MG 24 hr tablet   Oral   Take 1 tablet (25 mg total) by mouth daily.   30 tablet   6   . oxybutynin (DITROPAN) 5 MG  tablet   Oral   Take 1 tablet (5 mg total) by mouth 3 (three) times daily.   90 tablet   1   . traMADol (ULTRAM) 50 MG tablet   Oral   Take 1 tablet (50 mg total) by mouth every 6 (six) hours as needed for pain.   15 tablet   0     BP 139/90  Pulse 79  Temp(Src) 98 F (36.7 C) (Oral)  Resp 18  SpO2 97%  Physical Exam  Nursing note and vitals reviewed. Constitutional: No distress.  Disheveled, smells of urine  HENT:  Head: Normocephalic and atraumatic.  Right Ear: External ear normal.  Left Ear: External ear normal.  Mouth/Throat: No oropharyngeal exudate (icterus membranes are dry).  Eyes: Conjunctivae are normal. Right eye exhibits no discharge. Left eye exhibits no discharge. No scleral icterus.  Neck: Neck supple. No tracheal deviation present.  Cardiovascular: Normal rate, regular rhythm and intact distal pulses.   Pulmonary/Chest: Effort normal and breath sounds normal. No stridor. No respiratory distress. He has no wheezes. He has no rales.  Abdominal: Soft. Bowel sounds are normal. He exhibits no distension. There is no tenderness. There is no rebound and  no guarding.  Musculoskeletal: He exhibits no edema and no tenderness.  Neurological: He is alert. He has normal strength. No sensory deficit. Cranial nerve deficit:  no gross defecits noted. He exhibits normal muscle tone. He displays no seizure activity. Coordination normal.  Skin: Skin is warm and dry. No rash noted.  Psychiatric: He has a normal mood and affect.    ED Course  Procedures (including critical care time)  Labs Reviewed  CBC WITH DIFFERENTIAL - Abnormal; Notable for the following:    Neutrophils Relative 86 (*)    Lymphocytes Relative 7 (*)    All other components within normal limits  COMPREHENSIVE METABOLIC PANEL - Abnormal; Notable for the following:    Sodium 134 (*)    Potassium 3.4 (*)    Glucose, Bld 106 (*)    Albumin 3.4 (*)    All other components within normal limits  ETHANOL   PROTIME-INR  URINALYSIS, ROUTINE W REFLEX MICROSCOPIC   No results found.   1. Dehydration       MDM  No sign of abscess on exam.  Suspect mildy dehydrated.  Pt has been tolerating PO fluids.  Will dc home        Celene Kras, MD 11/05/12 2342

## 2012-11-05 NOTE — ED Notes (Signed)
Per EMS, patient from home, states he has had increased difficulty swallowing x 1.5 days. Patient uses wheelchair at home, fell from wheelchair yesterday while going out to smoke. Patient reports diarrhea and abd cramping.

## 2012-11-05 NOTE — ED Notes (Signed)
Patient informed we need a urine specimen. Patient says he's unable to provide that at this time.

## 2012-11-06 NOTE — ED Notes (Signed)
[facility] called for patient transfer 

## 2012-11-06 NOTE — Telephone Encounter (Signed)
Hey team, could you call Jeffrey [name] and ask for details?  I don't know what I need to do to repair his wheelchair.  Thank you!

## 2012-11-07 NOTE — Telephone Encounter (Signed)
Left several messages on pt's voicemail and hasn't rtn my calls. Proposito, Virgel Bouquet

## 2012-11-09 ENCOUNTER — Encounter (HOSPITAL_COMMUNITY): Payer: Self-pay | Admitting: Emergency Medicine

## 2012-11-09 ENCOUNTER — Emergency Department (HOSPITAL_COMMUNITY): Payer: Medicaid Other

## 2012-11-09 ENCOUNTER — Emergency Department (HOSPITAL_COMMUNITY)
Admission: EM | Admit: 2012-11-09 | Discharge: 2012-11-13 | Disposition: A | Discharge status: Home / Self Care | Payer: Medicaid Other | Attending: Emergency Medicine | Admitting: Emergency Medicine

## 2012-11-09 DIAGNOSIS — Z8619 Personal history of other infectious and parasitic diseases: Secondary | ICD-10-CM | POA: Insufficient documentation

## 2012-11-09 DIAGNOSIS — F1210 Cannabis abuse, uncomplicated: Secondary | ICD-10-CM | POA: Insufficient documentation

## 2012-11-09 DIAGNOSIS — Y939 Activity, unspecified: Secondary | ICD-10-CM | POA: Insufficient documentation

## 2012-11-09 DIAGNOSIS — F3289 Other specified depressive episodes: Secondary | ICD-10-CM | POA: Insufficient documentation

## 2012-11-09 DIAGNOSIS — F32A Depression, unspecified: Secondary | ICD-10-CM

## 2012-11-09 DIAGNOSIS — S46909A Unspecified injury of unspecified muscle, fascia and tendon at shoulder and upper arm level, unspecified arm, initial encounter: Secondary | ICD-10-CM | POA: Insufficient documentation

## 2012-11-09 DIAGNOSIS — W1789XA Other fall from one level to another, initial encounter: Secondary | ICD-10-CM | POA: Insufficient documentation

## 2012-11-09 DIAGNOSIS — F1410 Cocaine abuse, uncomplicated: Secondary | ICD-10-CM | POA: Insufficient documentation

## 2012-11-09 DIAGNOSIS — F329 Major depressive disorder, single episode, unspecified: Secondary | ICD-10-CM | POA: Insufficient documentation

## 2012-11-09 DIAGNOSIS — F1010 Alcohol abuse, uncomplicated: Secondary | ICD-10-CM

## 2012-11-09 DIAGNOSIS — R45851 Suicidal ideations: Secondary | ICD-10-CM | POA: Insufficient documentation

## 2012-11-09 DIAGNOSIS — I10 Essential (primary) hypertension: Secondary | ICD-10-CM | POA: Insufficient documentation

## 2012-11-09 DIAGNOSIS — G20A1 Parkinson's disease without dyskinesia, without mention of fluctuations: Secondary | ICD-10-CM | POA: Insufficient documentation

## 2012-11-09 DIAGNOSIS — Y9289 Other specified places as the place of occurrence of the external cause: Secondary | ICD-10-CM | POA: Insufficient documentation

## 2012-11-09 DIAGNOSIS — Z8659 Personal history of other mental and behavioral disorders: Secondary | ICD-10-CM | POA: Insufficient documentation

## 2012-11-09 DIAGNOSIS — Z8739 Personal history of other diseases of the musculoskeletal system and connective tissue: Secondary | ICD-10-CM | POA: Insufficient documentation

## 2012-11-09 DIAGNOSIS — F17200 Nicotine dependence, unspecified, uncomplicated: Secondary | ICD-10-CM | POA: Insufficient documentation

## 2012-11-09 DIAGNOSIS — G20 Parkinson's disease: Secondary | ICD-10-CM | POA: Insufficient documentation

## 2012-11-09 DIAGNOSIS — F339 Major depressive disorder, recurrent, unspecified: Secondary | ICD-10-CM

## 2012-11-09 LAB — COMPREHENSIVE METABOLIC PANEL
ALT: 10 U/L (ref 0–53)
Alkaline Phosphatase: 76 U/L (ref 39–117)
BUN: 7 mg/dL (ref 6–23)
CO2: 22 mEq/L (ref 19–32)
Calcium: 8.9 mg/dL (ref 8.4–10.5)
GFR calc Af Amer: >90 mL/min (ref >90)
GFR calc non Af Amer: >90 mL/min (ref >90)
Glucose, Bld: 96 mg/dL (ref 70–99)
Potassium: 3.5 mEq/L (ref 3.5–5.1)
Sodium: 140 mEq/L (ref 135–145)
Total Protein: 7.3 g/dL (ref 6.0–8.3)

## 2012-11-09 LAB — CBC WITH DIFFERENTIAL/PLATELET
Eosinophils Absolute: 0.1 10*3/uL (ref 0.0–0.7)
Eosinophils Relative: 2 % (ref 0–5)
Hemoglobin: 15.1 g/dL (ref 13.0–17.0)
Lymphocytes Relative: 34 % (ref 12–46)
Lymphs Abs: 2.4 10*3/uL (ref 0.7–4.0)
MCH: 30.7 pg (ref 26.0–34.0)
MCV: 86.4 fL (ref 78.0–100.0)
Monocytes Relative: 10 % (ref 3–12)
Platelets: 224 10*3/uL (ref 150–400)
RBC: 4.92 MIL/uL (ref 4.22–5.81)
WBC: 7 10*3/uL (ref 4.0–10.5)

## 2012-11-09 LAB — ETHANOL: Alcohol, Ethyl (B): 99 mg/dL — ABNORMAL HIGH (ref 0–11)

## 2012-11-09 LAB — RAPID URINE DRUG SCREEN, HOSP PERFORMED: Opiates: NOT DETECTED

## 2012-11-09 MED ORDER — TRAMADOL HCL 50 MG PO TABS
50.0000 mg | ORAL_TABLET | Freq: Four times a day (QID) | ORAL | Status: DC | PRN
  Administered 2012-11-09 – 2012-11-13 (×11): 50 mg via ORAL
  Filled 2012-11-09 (×11): qty 1

## 2012-11-09 MED ORDER — METOPROLOL SUCCINATE ER 25 MG PO TB24
25.0000 mg | ORAL_TABLET | Freq: Every day | ORAL | Status: DC
  Administered 2012-11-09 – 2012-11-13 (×5): 25 mg via ORAL
  Filled 2012-11-09 (×5): qty 1

## 2012-11-09 MED ORDER — OXYBUTYNIN CHLORIDE 5 MG PO TABS
5.0000 mg | ORAL_TABLET | Freq: Three times a day (TID) | ORAL | Status: DC
  Administered 2012-11-09 – 2012-11-13 (×12): 5 mg via ORAL
  Filled 2012-11-09 (×15): qty 1

## 2012-11-09 MED ORDER — NICOTINE 21 MG/24HR TD PT24
21.0000 mg | MEDICATED_PATCH | Freq: Every day | TRANSDERMAL | Status: DC
  Administered 2012-11-09 – 2012-11-12 (×4): 21 mg via TRANSDERMAL
  Filled 2012-11-09 (×5): qty 1

## 2012-11-09 MED ORDER — IBUPROFEN 200 MG PO TABS
600.0000 mg | ORAL_TABLET | Freq: Three times a day (TID) | ORAL | Status: DC | PRN
  Administered 2012-11-09 – 2012-11-13 (×8): 600 mg via ORAL
  Filled 2012-11-09 (×9): qty 3

## 2012-11-09 MED ORDER — ZOLPIDEM TARTRATE 5 MG PO TABS
5.0000 mg | ORAL_TABLET | Freq: Every evening | ORAL | Status: DC | PRN
  Administered 2012-11-09 – 2012-11-12 (×4): 5 mg via ORAL
  Filled 2012-11-09 (×4): qty 1

## 2012-11-09 MED ORDER — LORAZEPAM 1 MG PO TABS
1.0000 mg | ORAL_TABLET | Freq: Three times a day (TID) | ORAL | Status: DC | PRN
  Administered 2012-11-11 – 2012-11-13 (×7): 1 mg via ORAL
  Filled 2012-11-09 (×8): qty 1

## 2012-11-09 MED ORDER — ALUM & MAG HYDROXIDE-SIMETH 200-200-20 MG/5ML PO SUSP: 30.0000 mL | ORAL | Status: DC | PRN

## 2012-11-09 MED ORDER — GABAPENTIN 300 MG PO CAPS
600.0000 mg | ORAL_CAPSULE | Freq: Three times a day (TID) | ORAL | Status: DC
  Administered 2012-11-09 – 2012-11-13 (×12): 600 mg via ORAL
  Filled 2012-11-09 (×14): qty 2

## 2012-11-09 MED ORDER — ONDANSETRON HCL 4 MG PO TABS: 4.0000 mg | ORAL_TABLET | Freq: Three times a day (TID) | ORAL | Status: DC | PRN

## 2012-11-09 NOTE — ED Notes (Signed)
[id number]<NW> Expected date:<BR> Expected time:<BR> Means of arrival:<BR> Comments:<BR> EMS/SI

## 2012-11-09 NOTE — ED Provider Notes (Signed)
History    This chart was scribed for non-physician practitioner working with Celene Kras, MD by Gerlean Ren, ED Scribe. This patient was seen in room WTR5/WTR5 and the patient's care was started at 9:08 PM.    CSN: [id number]  Arrival date & time 11/09/12  2044   First MD Initiated Contact with Patient 11/09/12 2055      Chief Complaint  Patient presents with  . Medical Clearance     The history is provided by the patient. No language interpreter was used.  Jeffrey [name] is a 57 y.o. male brought in by ambulance to the Emergency Department complaining of having thoughts of self-injury and suicidal ideations.  Pt reports he has attempted suicide before but "not to the best of his ability."  Pt refuses to admit how he previously attempted suicide.  Pt also c/o chronic shooting left shoulder pain and states he fell 8 feet landing on a concrete sidewalk.  Pt also c/o pain radiating from left shoulder into left side neck.  Pt denies abdominal pain.  When questioned about prior visit for sore on right buttocks, pt reports he has a sore developing on his left buttock now. Pt repeatedly states that [facility], he is not getting enough narcotics for his chronic pain & that he has no one in his life. "I want to go to sleep forever."    Past Medical History  Diagnosis Date  . DDD (degenerative disc disease)   . Necrotizing fasciitis   . Degenerative disc disease   . Hypertension   . Degenerative disc disease   . Hepatitis C   . Chronic pain   . DDD (degenerative disc disease)   . Parkinson disease   . Mental disorder   . Depression   . DDD (degenerative disc disease)     Past Surgical History  Procedure Laterality Date  . Hip surgery    . Appendectomy    . Ankle surgery      No family history on file.  History  Substance Use Topics  . Smoking status: Current Every Day Smoker -- 1.00 packs/day for 36 years    Types: Cigarettes  . Smokeless tobacco:  Former Neurosurgeon    Types: Chew  . Alcohol Use: Yes      Review of Systems  Gastrointestinal: Negative for abdominal pain.  Musculoskeletal: Positive for arthralgias (left shoulder, chronic).  All other systems reviewed and are negative.    Allergies  Review of patient's allergies indicates no known allergies.  Home Medications   Current Outpatient Rx  Name  Route  Sig  Dispense  Refill  . gabapentin (NEURONTIN) 600 MG tablet   Oral   Take 1 tablet (600 mg total) by mouth 3 (three) times daily as needed.   90 tablet   6   . metoprolol succinate (TOPROL-XL) 25 MG 24 hr tablet   Oral   Take 1 tablet (25 mg total) by mouth daily.   30 tablet   6   . oxybutynin (DITROPAN) 5 MG tablet   Oral   Take 1 tablet (5 mg total) by mouth 3 (three) times daily.   90 tablet   1   . traMADol (ULTRAM) 50 MG tablet   Oral   Take 1 tablet (50 mg total) by mouth every 6 (six) hours as needed for pain.   15 tablet   0     BP 142/98  Pulse 74  Temp(Src) 97.8  F (36.6 C) (Oral)  Resp 22  SpO2 96%  Physical Exam  Nursing note and vitals reviewed. Constitutional: He is oriented to person, place, and time. He appears well-developed and well-nourished. No distress.  HENT:  Head: Normocephalic and atraumatic.  Mouth/Throat: Oropharynx is clear and moist. No oropharyngeal exudate.  Eyes: Conjunctivae and EOM are normal. Pupils are equal, round, and reactive to light. No scleral icterus.  Neck: Normal range of motion. Neck supple. No tracheal deviation present. No thyromegaly present.  Cardiovascular: Normal rate, regular rhythm, normal heart sounds and intact distal pulses.   Intact distal pulses, capillary refill < 3 seconds  Pulmonary/Chest: Effort normal and breath sounds normal. No stridor. No respiratory distress. He has no wheezes.  Abdominal: Soft.  Genitourinary:  Rectal exam assisted by nurse techs. No evidence of ulcerations. Left buttock with healed surgical scar. No  erythema, warmth or drainage.   Musculoskeletal: Normal range of motion. He exhibits no edema and no tenderness.  Left shoulder w mild ttp, normal ROM, no bruising or swelling. Mild left clavicle ttp. All other extremities with normal ROM  Neurological: He is alert and oriented to person, place, and time. Coordination normal.  No sensory deficit  Skin: Skin is warm and dry. No rash noted. He is not diaphoretic. No erythema. No pallor.  Skin intact, no tenting or bruising   Psychiatric: He has a normal mood and affect. His behavior is normal.    ED Course  Procedures (including critical care time) DIAGNOSTIC STUDIES: Oxygen Saturation is 96% on room air, adequate by my interpretation.    COORDINATION OF CARE: 9:20 PM- Patient informed of clinical course, understands medical decision-making process, and agrees with plan.  Labs Reviewed - No data to display No results found.   No diagnosis found.  Imaging reviewed no signs of acute abnormality.   MDM  SI Medical Clearance   Patient presents to the ER with a number of risk factors for suicide for example the patient has a history of Depression, substance abuse, insomnia, past suicide attempts, chronic pain, poor support system & homelessness. In addition the patient has a number of protective factors for example the patient does not appear to be psychotic, is here voluntarily, is speaking openly about their current situation. Under these circumstances I would conservatively estimate the suicide risk to be moderate-high. Current Plan is to have patient be evaluated by [facility] for further assessment on weather or not to be placed inpatient.  We have discussed that If the patient feels he was becoming unsafe, instead of acting on an impulse of self harm he will contact the crisis line or return to the emergency department. Patient has been cleared to move to [facility] pending further assessment.    I personally performed the services described in this  documentation, which was scribed in my presence. The recorded information has been reviewed and is accurate.           Jaci Carrel, [place] 11/09/12 2339

## 2012-11-09 NOTE — ED Notes (Signed)
Pt was changed into blue scrubs.   Called security to search pt and belongings.

## 2012-11-09 NOTE — ED Notes (Signed)
Per EMS pt is c/o having thoughts of harming himself and others for the past 4 days  Pt is c/o chronic pain to his left leg and c/o pain to his left shoulder after a fall this week  Pt is calm and cooperative for EMS

## 2012-11-09 NOTE — ED Provider Notes (Signed)
Medical screening examination/treatment/procedure(s) were performed by non-physician practitioner and as supervising physician I was immediately available for consultation/collaboration.    Celene Kras, MD 11/09/12 [phone]

## 2012-11-09 NOTE — ED Notes (Signed)
PA in room with pt.

## 2012-11-10 DIAGNOSIS — F332 Major depressive disorder, recurrent severe without psychotic features: Secondary | ICD-10-CM

## 2012-11-10 MED ORDER — BUPROPION HCL ER (XL) 150 MG PO TB24
150.0000 mg | ORAL_TABLET | Freq: Every day | ORAL | Status: DC
  Administered 2012-11-10 – 2012-11-13 (×4): 150 mg via ORAL
  Filled 2012-11-10 (×5): qty 1

## 2012-11-10 NOTE — BHH Counselor (Signed)
Jeffrey [name], assessment counselor at [facility], submitted Pt for admission to [facility]. [name], [facility] confirmed bed availability. Dr. Geoffery Lyons reviewed clinical information and, due to Pt's acuity and medication seeking behavior, recommended placement be sought at another facility. If placement at another facility cannot be secured Dr. Dub Mikes will reconsider for admission.  [name], LPC, [facility] Assessment Counselor
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
## 2012-11-10 NOTE — ED Notes (Signed)
Pt states that when he gets out he is going to go get 200 dollars worth of heroin, cigarettes and some beer and take it all at once and kill himself. Says its not worth living. Pt states he didn't have a plan, but now he does.

## 2012-11-10 NOTE — ED Provider Notes (Signed)
Pt sleeping; RNs report no concerns overnight.   John M Bednar, MD 11/11/12 1344 
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
## 2012-11-11 NOTE — ED Notes (Signed)
Nursing asst. Wrapped right elbow . Tolerated well.

## 2012-11-11 NOTE — ED Notes (Signed)
Patient is acting out and jerking the side rails. Patient is repetitively being told about his behavior . Patient is alert and oriented but angry about not receiving nicotine patch.

## 2012-11-11 NOTE — ED Provider Notes (Signed)
BP 132/79  Pulse 57  Temp(Src) 97.9 F (36.6 C) (Oral)  Resp 16  SpO2 94% No new issues overnight. Sleeping currently.   Loren Racer, MD 11/11/12 [phone]

## 2012-11-11 NOTE — ED Notes (Signed)
Patient is very upset about not having nicotine patch at this time. Patient request that nicotine patch be given  As scheduled will talk to attending ED doctor regarding his request

## 2012-11-11 NOTE — ED Notes (Signed)
Pt. Expressed that he was "tired of hustling to find pain meds" and that he was going to wait and get his next "200, buy some heroin, and end it".

## 2012-11-12 MED ORDER — AMITRIPTYLINE HCL 25 MG PO TABS
50.0000 mg | ORAL_TABLET | Freq: Every day | ORAL | Status: DC
  Administered 2012-11-12: 50 mg via ORAL
  Filled 2012-11-12 (×2): qty 1

## 2012-11-12 MED ORDER — HYDROCODONE-ACETAMINOPHEN 5-325 MG PO TABS
2.0000 | ORAL_TABLET | Freq: Once | ORAL | Status: AC
  Administered 2012-11-12: 2 via ORAL
  Filled 2012-11-12: qty 2

## 2012-11-12 NOTE — Progress Notes (Signed)
Patient declined by [facility], due to inability to ambulate which is considered exclusion criteria per [facility].  Rosey Bath, RN

## 2012-11-12 NOTE — ED Provider Notes (Signed)
11:20 AM Patient was seen by the psychiatrist recommends inpatient psychiatric admission.  He recommends Elavil 50 mg by mouth each bedtime x1 week then increase 100 mg by mouth each bedtime.  Lyanne Co, MD 11/12/12 1120

## 2012-11-12 NOTE — ED Notes (Signed)
Received report from rn and assumed care for pt. Pt resting in bed with eyes closed. resp even and unlabored. Sitter in use at bedside. Vw, rn.

## 2012-11-12 NOTE — BHH Counselor (Signed)
Referral faxed to [facility]. Milinda Pointer says [name] has a few beds available.  Evette Cristal, [place] Assessment Counselor

## 2012-11-12 NOTE — ED Notes (Signed)
telepsych consult placed and document faxed.

## 2012-11-12 NOTE — ED Provider Notes (Signed)
8:15 AM telepsych now. Pt reports he came to ER to get something to eat and to treat his pain. He reports SI without a plan.  It seems as though the majority of this is related to pain.   Filed Vitals:   11/12/12 0608  BP: 136/83  Pulse: 59  Temp: 97.7 F (36.5 C)  Resp: [address]     Lyanne Co, MD 11/12/12 [phone]

## 2012-11-12 NOTE — ED Notes (Addendum)
Pt agitated because, according to him,  he's not getting percocet or vicodin for pain management. Dropped urinal to floor with urine spilled on the floor.pt has just had tramadol for pain; but still wants "something stronger" for pain. Pt encouraged to relax and let the medication take effect.

## 2012-11-12 NOTE — ED Notes (Signed)
Right posterior forearm wound cleansed with normal saline. Nonadherent strip dsg applied. Site wrapped with kerlix. Pt tolerated dsg change.

## 2012-11-13 DIAGNOSIS — F339 Major depressive disorder, recurrent, unspecified: Secondary | ICD-10-CM

## 2012-11-13 MED ORDER — BUPROPION HCL ER (XL) 150 MG PO TB24: 150.0000 mg | ORAL_TABLET | Freq: Every day | ORAL | Status: DC

## 2012-11-13 NOTE — Progress Notes (Addendum)
CSW received call regarding pt transportation issue. Ptar had been called for patient and patient refused to ride home with ptar because ambulance was unable to secure wheelchair and transport pt wheelchair home. Pt tried calling friends to come get his wheelchair. Pt refused to leave wheelchair here until friends could pick his wheelchair up. Pt refused to go home by ptar. Pt stated that he was able to go home by cab. Pt reports that he drags his wheelchair up the back step into the house everyday. Pt reports that he is able to transfer from the chair to the bed, couch, anywhere. Pt reports he feels safe to transport home by cab and is able to get into his home.   Catha Gosselin, LCSWA  [phone] 11/13/2012 9:13pm

## 2012-11-13 NOTE — ED Provider Notes (Signed)
Patient sleeping. No reported problems overnight. Awaiting placement.  Gilda Crease, MD 11/13/12 [phone]

## 2012-11-13 NOTE — BHH Counselor (Signed)
3/9 Patient referred to [facility] and declined due to inability to ambulate. (Patient is wheelchair bound but able to complete all other ADL's).  3/10 Pt referred to [facility], [place], [place] and [place] side [place]; pending review 3/10 Declined by [facility] [facility],  3/10 Patient declined at [facility] due to  Malingering,  3/10 Patient referred to [facility] and declined d/t acuity. 3/10 No beds at [facility], [place], [place], [place], [place], [place],  [address], [place], [place], [place] not taking referrals until 14th,  3/10 [facility] and left a voice mail  requesting bed availability.

## 2012-11-13 NOTE — Progress Notes (Signed)
Cm spoke with TCU sitter who states pt has refused to attempt to get up.

## 2012-11-13 NOTE — BHH Counselor (Signed)
Discharge home and follow up with [facility]. Patient also given community mental health referrals.

## 2012-11-13 NOTE — BHH Suicide Risk Assessment (Signed)
Suicide Risk Assessment  Discharge Assessment     Demographic Factors:  Male, Adolescent or young adult, Caucasian, Low socioeconomic status and Unemployed  Mental Status Per Nursing Assessment::   On Admission:     Current Mental Status by Physician: Self-harm thoughts  Loss Factors: Decline in physical health and Financial problems/change in socioeconomic status  Historical Factors: Impulsivity  Risk Reduction Factors:   Sense of responsibility to family, Religious beliefs about death, Living with another person, especially a relative and Positive therapeutic relationship  Continued Clinical Symptoms:  Depression:   Anhedonia Comorbid alcohol abuse/dependence Impulsivity Recent sense of peace/wellbeing Severe Alcohol/Substance Abuse/Dependencies Personality Disorders:   Comorbid alcohol abuse/dependence Chronic Pain Unstable or Poor Therapeutic Relationship Previous Psychiatric Diagnoses and Treatments Medical Diagnoses and Treatments/Surgeries  Cognitive Features That Contribute To Risk:  Polarized thinking    Suicide Risk:  Minimal: No identifiable suicidal ideation.  Patients presenting with no risk factors but with morbid ruminations; may be classified as minimal risk based on the severity of the depressive symptoms  Discharge Diagnoses:   AXIS I:  Major Depression, Recurrent severe AXIS II:  Deferred AXIS III:   Past Medical History  Diagnosis Date  . DDD (degenerative disc disease)   . Necrotizing fasciitis   . Degenerative disc disease   . Hypertension   . Degenerative disc disease   . Hepatitis C   . Chronic pain   . DDD (degenerative disc disease)   . Parkinson disease   . Mental disorder   . Depression   . DDD (degenerative disc disease)    AXIS IV:  economic problems, housing problems, occupational problems, other psychosocial or environmental problems and problems related to social environment AXIS V:  41-50 serious symptoms  Plan Of  Care/Follow-up recommendations:  Activity:  as tolerated Diet:  regular  Is patient on multiple antipsychotic therapies at discharge:  No   Has Patient had three or more failed trials of antipsychotic monotherapy by history:  No  Recommended Plan for Multiple Antipsychotic Therapies: Not applicalbe  JONNALAGADDA,JANARDHAHA R. 11/13/2012, 6:03 PM

## 2012-11-13 NOTE — Consult Note (Signed)
Reason for Consult: Depression Referring Physician: Dr. Dannielle Burn [name] is an 57 y.o. male.  HPI: Patient was seen and chart reviewed. Patient has been suffering with chronic recurrent symptoms of depression multiple psychosocial stressors possible substance abuse. Patient has responded to the medication management without adverse affects. Patient was able to sleep well he well and able to control his emotions. Patient continue to seek pain medication management which he failed and the past. Patient is willing to followup with outpatient psychiatric services and pain medication management at the time of discharge.  MSE: Patient stated mood is fine his affect was appropriate he was calm and quite cooperative to he has no irritability agitation and aggressive behavior. Patient has denied current suicidal onset ideation intents or plan has no evidence of psychotic symptoms.  Past Medical History  Diagnosis Date  . DDD (degenerative disc disease)   . Necrotizing fasciitis   . Degenerative disc disease   . Hypertension   . Degenerative disc disease   . Hepatitis C   . Chronic pain   . DDD (degenerative disc disease)   . Parkinson disease   . Mental disorder   . Depression   . DDD (degenerative disc disease)     Past Surgical History  Procedure Laterality Date  . Hip surgery    . Appendectomy    . Ankle surgery      History reviewed. No pertinent family history.  Social History:  reports that he has been smoking Cigarettes.  He has a 36 pack-year smoking history. He has quit using smokeless tobacco. His smokeless tobacco use included Chew. He reports that  drinks alcohol. He reports that he uses illicit drugs (Marijuana and Cocaine).  Allergies: No Known Allergies  Medications: I have reviewed the patient's current medications.  No results found for this or any previous visit (from the past 48 hour(s)).  No results found.  Positive for bad mood, behavior problems,  depression, excessive alcohol consumption, illegal drug usage and sleep disturbance Blood pressure 157/91, pulse 60, temperature 98.6 F (37 C), temperature source Oral, resp. rate 18, SpO2 100.00%.   Assessment/Plan: Major depressive disorder recurrent Chronic pain syndrome  Recommendation: Patient has responded to the medication management and will be referred to the pain medications clinic upon discharge. Patient will be following up with the [facility] behavior health upon discharge.  Jeffrey [name],Jeffrey R. 11/13/2012, 5:57 PM

## 2012-11-15 ENCOUNTER — Emergency Department (EMERGENCY_DEPARTMENT_HOSPITAL)
Admission: EM | Admit: 2012-11-15 | Discharge: 2012-11-17 | Disposition: A | Discharge status: Home / Self Care | Payer: Medicaid Other | Source: Home / Self Care | Attending: Emergency Medicine | Admitting: Emergency Medicine

## 2012-11-15 ENCOUNTER — Encounter (HOSPITAL_COMMUNITY): Payer: Self-pay | Admitting: Emergency Medicine

## 2012-11-15 DIAGNOSIS — Z8619 Personal history of other infectious and parasitic diseases: Secondary | ICD-10-CM | POA: Insufficient documentation

## 2012-11-15 DIAGNOSIS — Z8739 Personal history of other diseases of the musculoskeletal system and connective tissue: Secondary | ICD-10-CM | POA: Insufficient documentation

## 2012-11-15 DIAGNOSIS — M25519 Pain in unspecified shoulder: Secondary | ICD-10-CM | POA: Insufficient documentation

## 2012-11-15 DIAGNOSIS — I10 Essential (primary) hypertension: Secondary | ICD-10-CM | POA: Insufficient documentation

## 2012-11-15 DIAGNOSIS — G8929 Other chronic pain: Secondary | ICD-10-CM | POA: Insufficient documentation

## 2012-11-15 DIAGNOSIS — B182 Chronic viral hepatitis C: Secondary | ICD-10-CM | POA: Insufficient documentation

## 2012-11-15 DIAGNOSIS — Y9389 Activity, other specified: Secondary | ICD-10-CM | POA: Insufficient documentation

## 2012-11-15 DIAGNOSIS — IMO0002 Reserved for concepts with insufficient information to code with codable children: Secondary | ICD-10-CM | POA: Insufficient documentation

## 2012-11-15 DIAGNOSIS — F17200 Nicotine dependence, unspecified, uncomplicated: Secondary | ICD-10-CM | POA: Insufficient documentation

## 2012-11-15 DIAGNOSIS — G20A1 Parkinson's disease without dyskinesia, without mention of fluctuations: Secondary | ICD-10-CM | POA: Insufficient documentation

## 2012-11-15 DIAGNOSIS — Z8659 Personal history of other mental and behavioral disorders: Secondary | ICD-10-CM | POA: Insufficient documentation

## 2012-11-15 DIAGNOSIS — F3289 Other specified depressive episodes: Secondary | ICD-10-CM | POA: Insufficient documentation

## 2012-11-15 DIAGNOSIS — G20 Parkinson's disease: Secondary | ICD-10-CM | POA: Insufficient documentation

## 2012-11-15 DIAGNOSIS — Z87898 Personal history of other specified conditions: Secondary | ICD-10-CM | POA: Insufficient documentation

## 2012-11-15 DIAGNOSIS — Z79899 Other long term (current) drug therapy: Secondary | ICD-10-CM | POA: Insufficient documentation

## 2012-11-15 DIAGNOSIS — R45851 Suicidal ideations: Secondary | ICD-10-CM | POA: Insufficient documentation

## 2012-11-15 DIAGNOSIS — Y929 Unspecified place or not applicable: Secondary | ICD-10-CM | POA: Insufficient documentation

## 2012-11-15 DIAGNOSIS — W050XXA Fall from non-moving wheelchair, initial encounter: Secondary | ICD-10-CM | POA: Insufficient documentation

## 2012-11-15 MED ORDER — ALUM & MAG HYDROXIDE-SIMETH 200-200-20 MG/5ML PO SUSP
30.0000 mL | ORAL | Status: DC | PRN
  Filled 2012-11-15: qty 30

## 2012-11-15 MED ORDER — NICOTINE 21 MG/24HR TD PT24
21.0000 mg | MEDICATED_PATCH | Freq: Every day | TRANSDERMAL | Status: DC
  Administered 2012-11-15 – 2012-11-17 (×3): 21 mg via TRANSDERMAL
  Filled 2012-11-15 (×4): qty 1

## 2012-11-15 MED ORDER — ONDANSETRON HCL 4 MG PO TABS
4.0000 mg | ORAL_TABLET | Freq: Three times a day (TID) | ORAL | Status: DC | PRN
  Administered 2012-11-15 – 2012-11-17 (×2): 4 mg via ORAL
  Filled 2012-11-15 (×2): qty 1

## 2012-11-15 MED ORDER — IBUPROFEN 200 MG PO TABS
600.0000 mg | ORAL_TABLET | Freq: Three times a day (TID) | ORAL | Status: DC | PRN
  Administered 2012-11-15 – 2012-11-17 (×4): 600 mg via ORAL
  Filled 2012-11-15 (×5): qty 3

## 2012-11-15 MED ORDER — LORAZEPAM 1 MG PO TABS
1.0000 mg | ORAL_TABLET | Freq: Three times a day (TID) | ORAL | Status: DC | PRN
  Administered 2012-11-15 – 2012-11-17 (×5): 1 mg via ORAL
  Filled 2012-11-15 (×5): qty 1

## 2012-11-15 MED ORDER — ACETAMINOPHEN 325 MG PO TABS
650.0000 mg | ORAL_TABLET | ORAL | Status: DC | PRN
  Administered 2012-11-15 – 2012-11-17 (×5): 650 mg via ORAL
  Filled 2012-11-15 (×5): qty 2

## 2012-11-15 NOTE — BHH Counselor (Signed)
Pt referred to [facility]; pending review.   3/12 Pt referred to [place], [facility], [place], [place], [address] [name]), and [facility].   3/12 No beds at [facility], [place] not taking referrals until after the 14th, [place], [address], [address], [address], [place], [place] ([employer]), and [facility].

## 2012-11-15 NOTE — Progress Notes (Signed)
ED CM spoke with [facility] transitional care coordinator who offered pt resources during his last [facility] ED visit.Marland Kitchen Pt recently d/c on 11/14/12. Transitional care coordinator confirmed Margrett Rud AD has contacted for pain clinic who did the referral for pain clinic appointment for pt on 11/14/12  Pt was contacted by [facility] staff on 11/14/12 and they were told by pt that he was in severe pain and "if this pain continues through the end of the mont when I get my check I am just going to buy heroin"  Pt contracted for safety with [facility] staff Continues to be followed by [facility] and [facility]  Will link him to a medical home ([facility]) and follow

## 2012-11-15 NOTE — ED Notes (Signed)
Pt changed into blue scrubs.  Pt belongings as follows:  1 black t shirt, 1 green jacket, 1 orange set of shorts, 1 pair of blue and white shoes, 1 blue jeans, 1 can holder, 3 x $1 bills, samsung cell phone, top plate of a set of dentures, brown wallet, Medicaid card, medicare card, social security card, drivers license, 3 cheyene cigars, 6 marlboro red cigarettes, 1 reading glasses, 1 pen, 1 book of matches, 2 letters.

## 2012-11-15 NOTE — ED Notes (Signed)
Pt has two letters at bedside stating he is in a Psychosocial Rehab Program, asking that "his treatment and goals of becoming independent are not impeded."  Pt states that he needs narcotics for the pain or he will continue to use cocaine to "self medicate"
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
## 2012-11-15 NOTE — ED Provider Notes (Signed)
0915.  Pt has been recommended for inpt psych placement.  The ACt Team is working towards finding an appropriate facility.  Tobin Chad, MD 11/15/12 [phone]

## 2012-11-15 NOTE — ED Notes (Signed)
TelePsych faxed and initiated.   

## 2012-11-15 NOTE — ED Notes (Signed)
Telepsych completed. Patient frequently yelling out for pain meds.

## 2012-11-15 NOTE — ED Provider Notes (Signed)
History     CSN: [id number]  Arrival date & time 11/15/12  0422   First MD Initiated Contact with Patient 11/15/12 [phone]      Chief Complaint  Patient presents with  . Medication Refill    (Consider location/radiation/quality/duration/timing/severity/associated sxs/prior treatment) HPI Suicidal ideation. Patient states he is in chronic pain all of the time and his doctors do not provide him enough narcotic pain medication. He is contemplating suicide because he is never pain free. He has chronic shooting left shoulder pain. Pt also c/o pain radiating from left shoulder into left side neck. No chest pain. No shortness of breath. No abdominal pain. No vomiting. No primary care physician. Patient recently seen here for the same. Symptoms moderate severity.  Past Medical History  Diagnosis Date  . DDD (degenerative disc disease)   . Necrotizing fasciitis   . Degenerative disc disease   . Hypertension   . Degenerative disc disease   . Hepatitis C   . Chronic pain   . DDD (degenerative disc disease)   . Parkinson disease   . Mental disorder   . Depression   . DDD (degenerative disc disease)     Past Surgical History  Procedure Laterality Date  . Hip surgery    . Appendectomy    . Ankle surgery      History reviewed. No pertinent family history.  History  Substance Use Topics  . Smoking status: Current Every Day Smoker -- 1.00 packs/day for 36 years    Types: Cigarettes  . Smokeless tobacco: Former Neurosurgeon    Types: Chew  . Alcohol Use: Yes      Review of Systems  Constitutional: Negative for fever and chills.  HENT: Negative for neck pain and neck stiffness.   Eyes: Negative for pain.  Respiratory: Negative for shortness of breath.   Cardiovascular: Negative for chest pain.  Gastrointestinal: Negative for abdominal pain.  Genitourinary: Negative for dysuria.  Musculoskeletal: Negative for back pain.  Skin: Negative for rash.  Neurological: Negative for headaches.   Psychiatric/Behavioral: Positive for suicidal ideas.  All other systems reviewed and are negative.    Allergies  Review of patient's allergies indicates no known allergies.  Home Medications   Current Outpatient Rx  Name  Route  Sig  Dispense  Refill  . gabapentin (NEURONTIN) 600 MG tablet   Oral   Take 1 tablet (600 mg total) by mouth 3 (three) times daily as needed.   90 tablet   6   . metoprolol succinate (TOPROL-XL) 25 MG 24 hr tablet   Oral   Take 1 tablet (25 mg total) by mouth daily.   30 tablet   6   . oxybutynin (DITROPAN) 5 MG tablet   Oral   Take 1 tablet (5 mg total) by mouth 3 (three) times daily.   90 tablet   1   . traMADol (ULTRAM) 50 MG tablet   Oral   Take 1 tablet (50 mg total) by mouth every 6 (six) hours as needed for pain.   15 tablet   0   . buPROPion (WELLBUTRIN XL) 150 MG 24 hr tablet   Oral   Take 1 tablet (150 mg total) by mouth daily.   30 tablet   0     BP 148/87  Pulse 71  Temp(Src) 97.7 F (36.5 C) (Oral)  Resp 23  SpO2 95%  Physical Exam  Constitutional: He is oriented to person, place, and time. He appears well-developed and well-nourished.  HENT:  Head: Normocephalic and atraumatic.  Eyes: EOM are normal. Pupils are equal, round, and reactive to light.  Neck: Neck supple.  Cardiovascular: Regular rhythm and intact distal pulses.   Pulmonary/Chest: Effort normal. No respiratory distress.  Musculoskeletal: Normal range of motion. He exhibits no edema.  No deformity, erythema or increased warmth to touch with distal neuro vascular intact x4  Neurological: He is alert and oriented to person, place, and time.  Skin: Skin is warm and dry.  Psychiatric:  Depressed affect    ED Course  Procedures (including critical care time)   Dg Ribs Unilateral W/chest Left  11/09/2012  *RADIOLOGY REPORT*  Clinical Data: Medical clearance, fall, shoulder pain  LEFT RIBS AND CHEST - 3+ VIEW  Comparison: Chest radiograph 07/08/2011   Findings: Normal cardiac silhouette.  There is peribronchial cuffing centrally.  Low lung volumes.  Dedicated views of the left ribs demonstrate no displaced fracture.  IMPRESSION:  1.  Peribronchial cuffing could represent bronchitis or bronchiolitis.  2.  No evidence of rib fracture or pneumothorax.   Original Report Authenticated By: Genevive Bi, M.D.    Dg Shoulder Left  11/09/2012  *RADIOLOGY REPORT*  Clinical Data: Medical clearance; fell 8 feet onto sidewalk.  Left shoulder pain.  LEFT SHOULDER - 2+ VIEW  Comparison: Left shoulder radiographs performed 05/18/2012  Findings: There is no evidence of fracture or dislocation.  The left humeral head is seated within the glenoid fossa.  Minimal superior osteophyte formation is noted at the left acromioclavicular joint.  No significant soft tissue abnormalities are seen.  The visualized portions of the left lung are clear.  IMPRESSION: No evidence of fracture or dislocation.   Original Report Authenticated By: Tonia Ghent, M.D.    Holding narcotics given chronic pain complaints  Labs, Ua/ UDs,   tylenol, motrin prn pain   MDM  SI, chronic pain - patient is voluntary  Records reviewed, recent imaging shoulder and ribs does not demonstrate any acute abnormality.  Psych labs  Psych consult        Sunnie Nielsen, MD 11/15/12 [phone]

## 2012-11-15 NOTE — ED Notes (Signed)
Per EMS, pt states that he needs narcotic pain medications.  Pt states he was seen here before and referred to pain clinic, but "that is too much responsibility."  Pt reported to EMS that "I took a lot of cocaine today, and I will take more if I do not get my medications."  Pt aggressive with [employer] and EMS

## 2012-11-16 ENCOUNTER — Telehealth (HOSPITAL_COMMUNITY): Payer: Self-pay | Admitting: Licensed Clinical Social Worker

## 2012-11-16 MED ORDER — DULOXETINE HCL 60 MG PO CPEP
60.0000 mg | ORAL_CAPSULE | Freq: Every day | ORAL | Status: DC
  Administered 2012-11-16 – 2012-11-17 (×2): 60 mg via ORAL
  Filled 2012-11-16 (×3): qty 1

## 2012-11-16 NOTE — Progress Notes (Signed)
Pt denied at [facility] and [facility] based on med seeking behaviors, hx of non-compliance and acuity.  Vickii Penna, LCSWA [phone]  Clinical Social Work

## 2012-11-16 NOTE — ED Notes (Signed)
Pt walked to doorway and sts "I'm going to talk to the doctor."  Pt was directed to get back into the bed.

## 2012-11-16 NOTE — ED Provider Notes (Signed)
Psych consult recommends psychiatic inpatient hospitalization.  D/C wellbutrin and start cymbalta.  Patient medically cleared.  Hilario Quarry, MD 11/16/12 2031

## 2012-11-16 NOTE — Progress Notes (Signed)
CSW faxed the patient for review at the following hospitals:  [facility] [phone] - wait list - [facility] [phone]  - [facility] [phone]  - [facility] [phone]  [facility] Richters [phone] - d/c later today Berton Lan [phone] - **possible geri-psych admission**  [facility] [phone]  Earlene Plater [phone]  [facility] [phone]  - [place] [phone]  - [address] [facility] [phone]  - [facility] Fetter [phone]   There were no beds available at: [place], [place], [place], [place], [place], [place]

## 2012-11-16 NOTE — ED Provider Notes (Signed)
Pt awaiting psych assessent while in the ED.  If felt he needs placement, will recheck labs and ACT will place.  Pt is in no distress at this time.    BP 148/92  Pulse 60  Temp(Src) 98 F (36.7 C) (Oral)  Resp 19  SpO2 95%   Joya Gaskins, MD 11/16/12 [phone]

## 2012-11-16 NOTE — ED Notes (Signed)
Pt c/o a wound on his R forearm. Removed dressing from yesterday and applied a new dressing.

## 2012-11-16 NOTE — BHH Counselor (Signed)
Pt referred to [facility]; declined due to inability to walk. Pt is wheelchair bound. [facility] also denied patient due to vascular dementia. EDP and ED staff are unable to find any supporting documentation that patient is diagnosed with dementia. Will continue to reviewing patients medical history for the reported dementia diagnosis.   Pt referred to [facility],  [facility], [facility],   [facility], [facility] expecting d/c's later today, [facility] possible may have a geri-psych admission,   [facility], [place], [place], [place], and [facility]	[place].  Declined at [facility] and [facility].  No beds available at: [address], [place], [place], [place], [place], and [place].

## 2012-11-17 ENCOUNTER — Encounter (HOSPITAL_COMMUNITY): Payer: Self-pay

## 2012-11-17 DIAGNOSIS — F329 Major depressive disorder, single episode, unspecified: Secondary | ICD-10-CM

## 2012-11-17 MED ORDER — DULOXETINE HCL 60 MG PO CPEP: 60.0000 mg | ORAL_CAPSULE | Freq: Every day | ORAL | Status: DC

## 2012-11-17 NOTE — Progress Notes (Addendum)
CSW to seek guidance from CSW Director/ Asst Director.    Pt stated to CSW that he does not have a wheelchair.  CSW called the following places: [employer] [phone] - none [facility] [phone] - none [place] [phone] - none [place] [phone]- none [place] [phone] [place] [phone] [facility] [phone]- for sale only no donations [employer] [phone]- no pointed me to referral line ([phone]) Officer Holbrook sent an Technical sales engineer to [employer] of [place] to check on the wheelchair that was left- it was not there [facility]- Referred me to [employer] and [employer] - no answer [facility] [phone] - no answer [employer] [phone]  CSW has no other resources available.  Vickii Penna, LCSWA [phone]  Clinical Social Work

## 2012-11-17 NOTE — ED Notes (Signed)
Patient stated that we were not doing anything for him and he will throw himself in the floor so he can have attention

## 2012-11-17 NOTE — ED Notes (Signed)
Pt present to ED room 21 with c/o etoh intoxication.  ems picked pt up from the porch of his ex roommate.  Pt report to EMS that he is homeless and wants to stay with his ex roommate.  Pt aaox3.  Pt c/o left hip/knee pain.  Pt uses wheelchair.  Pt present incontinent.

## 2012-11-17 NOTE — Progress Notes (Signed)
[employer]- [phone] - Montel states no wheelchairs [employer] [email or web address] - [phone] - Reggie states no wheelchairs Data processing [email or web address] - [phone] - called by SW states no wheelchairs SW also called the [facility] in [place]  CM spoke with [employer] who will check and return a call to [facility] or ED RN

## 2012-11-17 NOTE — Progress Notes (Signed)
[facility] ED CM goggled wheelchair donations for [place] to find a possible w/c for pt Spoke with Nettie Elm of [employer] who confirms she has 2 w/c but with the pt weight of 230 lbs the 2 standard w/c would not be suitable for him ED SW updated

## 2012-11-17 NOTE — Progress Notes (Signed)
Pt requested CSW to check the ED room (#10) for his phone.  Pt states it was in his belongings when he was here and it may have fallen out.  CSW checked his room and inquired with RN- no cell phone was left in the room.  Pt was advised.    Vickii Penna, LCSWA [phone]  Clinical Social Work

## 2012-11-17 NOTE — ED Notes (Signed)
Patient requested pain medication.  Spoke with MD.  No prescriptions written at this time.

## 2012-11-17 NOTE — [facility] Notes (Signed)
[facility] ED CM consulted by ED RN, Fleet Contras, ED charge Diane and ED SW about wheelchair for pt for d/c CM discussed DME company regulations that if pt has a w/c within last 2-3 yrs most insurance coverage another w/c can not be provided.  CM spoke with Lucretia of [facility] to inquire about a w/c and Cm was informed again of the regulations that if pt has a w/c within last 2-3 yrs most insurance coverage another w/c can not be provided. CM called [phone] [facility] rehabilitation department and referred to [facility] therapy director [employer] [phone] CM left a voice message requesting a return call if [facility] therapy department may have a possible loaner or just a w/c to give to the pt.

## 2012-11-17 NOTE — Progress Notes (Signed)
CM spoke with pt to inform him of DME regulations about coverage of w/c Pt stated he has a powered w/ccovered by medicaid that has been broke for 2 months at a previous facility that he lived in.  He reported not having a manual w/c covered by medicaid but only borrowing manual w/c from the facilities he has resided in. CM spoke with Baxter Hire of [employer] to confirm that if pt had a powered w/c covered by medicaid it also counts and medicaid will not cover a new one.  [facility] rehab Director S Britt Donated a burgandy manual w/c to pt (no available leg rests but pt states the w/c left in the parking lot when EMS picked him up did not have leg rests) Pt agreed to have w/c as is and states he will return w/c to [facility] "as soon as I can"

## 2012-11-17 NOTE — Consult Note (Signed)
Reason for Consult: Chronic pain syndrome, depression and polysubstance abuse Referring Physician: Dr. Maureen Chatters Jeffrey [name] is an 57 y.o. male.  HPI: Patient was seen and chart reviewed. Patient was known to this Jeffrey [name] and the [facility] long emergency department from his of multiple [facility] long emergency visits. Patient was recently the child with the appropriate care plan and then he did not follow through. Patient reported he walked out of the boarding home and does try to seek shelter with the friend's home without any response or contact. Patient called 911 will make him and parking lot 4:30 in the morning and brought into the emergency department. Patient has similar episodes during last snow day. Patient was seeking pain medications in the emergency department instead of pain clinic. Patient stated that going to pain clinic is at too much responsibility which is not willing to take it. Patient understands the responsible days but not willing to follow through.  MSE: Patient was lying down in his bed, quite cooperative and not in distress. Patient continued to have a depressed mood anxious affect but denies current suicidal or homicidal ideations intents or plans. Patient has no evidence of acute psychotic break or responding to internal stimuli. Patient has poor insight judgment and impulse control.  Past Medical History  Diagnosis Date  . DDD (degenerative disc disease)   . Necrotizing fasciitis   . Degenerative disc disease   . Hypertension   . Degenerative disc disease   . Hepatitis C   . Chronic pain   . DDD (degenerative disc disease)   . Parkinson disease   . Mental disorder   . Depression   . DDD (degenerative disc disease)     Past Surgical History  Procedure Laterality Date  . Hip surgery    . Appendectomy    . Ankle surgery      History reviewed. No pertinent family history.  Social History:  reports that he has been smoking Cigarettes.  He has a 36 pack-year  smoking history. He has quit using smokeless tobacco. His smokeless tobacco use included Chew. He reports that  drinks alcohol. He reports that he uses illicit drugs (Marijuana and Cocaine).  Allergies: No Known Allergies  Medications: I have reviewed the patient's current medications.  No results found for this or any previous visit (from the past 48 hour(s)).  No results found.  Positive for anxiety, bad mood, behavior problems, borderline personality disorder, depression, excessive alcohol consumption and illegal drug usage Blood pressure 145/90, pulse 70, temperature 97.7 F (36.5 C), temperature source Oral, resp. rate 16, SpO2 95.00%.   Assessment/Plan: Depression secondary to chronic pain syndrome Polysubstance abuse including narcotic.  Patient does not meet criteria for acute psychiatric hospitalization. Patient will be referred with outpatient psychiatric services with the care plan. Patient will be referred to the case manager and social services are appropriate disposition plans.  JONNALAGADDA,JANARDHAHA R. 11/17/2012, 8:43 AM

## 2012-11-17 NOTE — ED Notes (Signed)
Patient complaining that medication is

## 2012-11-18 ENCOUNTER — Emergency Department (HOSPITAL_COMMUNITY)
Admission: EM | Admit: 2012-11-18 | Discharge: 2012-11-18 | Disposition: A | Discharge status: Home / Self Care | Payer: Medicaid Other | Attending: Emergency Medicine | Admitting: Emergency Medicine

## 2012-11-18 DIAGNOSIS — T07XXXA Unspecified multiple injuries, initial encounter: Secondary | ICD-10-CM

## 2012-11-18 LAB — ETHANOL
Alcohol, Ethyl (B): 227 mg/dL — ABNORMAL HIGH (ref 0–11)
Alcohol, Ethyl (B): 96 mg/dL — ABNORMAL HIGH (ref 0–11)

## 2012-11-18 NOTE — ED Notes (Signed)
Bed:WA21<BR> Expected date:<BR> Expected time:<BR> Means of arrival:<BR> Comments:<BR> EMS

## 2012-11-18 NOTE — ED Notes (Signed)
Pt verbalizes understanding 
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
## 2012-11-18 NOTE — ED Provider Notes (Signed)
Awaits repeat alcohol level and d/c  10:32 AM Pt was discharged by Dr. Patria Mane prior my evaluation.  I have not directly care for this patient  Fayrene Helper, Cordelia Poche 11/19/12 1308
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
## 2012-11-19 ENCOUNTER — Emergency Department (HOSPITAL_COMMUNITY)
Admission: EM | Admit: 2012-11-19 | Discharge: 2012-11-19 | Disposition: A | Discharge status: Home / Self Care | Payer: Medicaid Other | Attending: Emergency Medicine | Admitting: Emergency Medicine

## 2012-11-19 ENCOUNTER — Encounter (HOSPITAL_COMMUNITY): Payer: Self-pay | Admitting: Emergency Medicine

## 2012-11-19 DIAGNOSIS — F1410 Cocaine abuse, uncomplicated: Secondary | ICD-10-CM | POA: Insufficient documentation

## 2012-11-19 DIAGNOSIS — Z9889 Other specified postprocedural states: Secondary | ICD-10-CM | POA: Insufficient documentation

## 2012-11-19 DIAGNOSIS — F17200 Nicotine dependence, unspecified, uncomplicated: Secondary | ICD-10-CM | POA: Insufficient documentation

## 2012-11-19 DIAGNOSIS — F3289 Other specified depressive episodes: Secondary | ICD-10-CM | POA: Insufficient documentation

## 2012-11-19 DIAGNOSIS — Z8679 Personal history of other diseases of the circulatory system: Secondary | ICD-10-CM | POA: Insufficient documentation

## 2012-11-19 DIAGNOSIS — Z8739 Personal history of other diseases of the musculoskeletal system and connective tissue: Secondary | ICD-10-CM | POA: Insufficient documentation

## 2012-11-19 DIAGNOSIS — M25559 Pain in unspecified hip: Secondary | ICD-10-CM | POA: Insufficient documentation

## 2012-11-19 DIAGNOSIS — G20A1 Parkinson's disease without dyskinesia, without mention of fluctuations: Secondary | ICD-10-CM | POA: Insufficient documentation

## 2012-11-19 DIAGNOSIS — G8929 Other chronic pain: Secondary | ICD-10-CM | POA: Insufficient documentation

## 2012-11-19 DIAGNOSIS — M549 Dorsalgia, unspecified: Secondary | ICD-10-CM | POA: Insufficient documentation

## 2012-11-19 DIAGNOSIS — F1210 Cannabis abuse, uncomplicated: Secondary | ICD-10-CM | POA: Insufficient documentation

## 2012-11-19 DIAGNOSIS — F489 Nonpsychotic mental disorder, unspecified: Secondary | ICD-10-CM | POA: Insufficient documentation

## 2012-11-19 DIAGNOSIS — F1010 Alcohol abuse, uncomplicated: Secondary | ICD-10-CM | POA: Insufficient documentation

## 2012-11-19 DIAGNOSIS — F1120 Opioid dependence, uncomplicated: Secondary | ICD-10-CM | POA: Insufficient documentation

## 2012-11-19 DIAGNOSIS — Z8619 Personal history of other infectious and parasitic diseases: Secondary | ICD-10-CM | POA: Insufficient documentation

## 2012-11-19 DIAGNOSIS — M25569 Pain in unspecified knee: Secondary | ICD-10-CM | POA: Insufficient documentation

## 2012-11-19 DIAGNOSIS — I10 Essential (primary) hypertension: Secondary | ICD-10-CM | POA: Insufficient documentation

## 2012-11-19 MED ORDER — OXYCODONE-ACETAMINOPHEN 5-325 MG PO TABS
1.0000 | ORAL_TABLET | Freq: Once | ORAL | Status: AC
  Administered 2012-11-19: 1 via ORAL
  Filled 2012-11-19: qty 1

## 2012-11-19 NOTE — ED Provider Notes (Signed)
History     CSN: [id number]  Arrival date & time 11/19/12  0448   First MD Initiated Contact with Patient 11/19/12 [phone]         The history is provided by the patient and medical records.   patient presents the emergency department today with ongoing chronic pain.  He states "I'm tired of living with this amount of pain".  He has no suicidal plan.  He does have a long-standing history of depression.  He was seen by her psychiatrist 2 days ago for the same symptoms and at that time was felt to be psychiatrically stable not a threat to himself or to others.  He is a long-standing history of opioid dependence.  He continues to use and abuse alcohol.  I saw him in the emergency department last night for alcohol intoxication.  He states he uses the alcohol to treat his pain.  He denies chest pain or shortness of breath.  No abdominal pain.  He is a long-standing history of chronic back pain.  He states he used to be on 30 mg OxyContin but this was stopped by his primary care physician and is unsure why.  He is currently attempting to get into pain clinics.  He states he also has chronic knee pain and that the orthopedist says his knee is "inoperable".   Past Medical History  Diagnosis Date  . DDD (degenerative disc disease)   . Necrotizing fasciitis   . Degenerative disc disease   . Hypertension   . Degenerative disc disease   . Hepatitis C   . Chronic pain   . DDD (degenerative disc disease)   . Parkinson disease   . Mental disorder   . Depression   . DDD (degenerative disc disease)     Past Surgical History  Procedure Laterality Date  . Hip surgery    . Appendectomy    . Ankle surgery      No family history on file.  History  Substance Use Topics  . Smoking status: Current Every Day Smoker -- 1.00 packs/day for 36 years    Types: Cigarettes  . Smokeless tobacco: Former Neurosurgeon    Types: Chew  . Alcohol Use: Yes      Review of Systems  All other systems reviewed and are  negative.    Allergies  Review of patient's allergies indicates no known allergies.  Home Medications   Current Outpatient Rx  Name  Route  Sig  Dispense  Refill  . gabapentin (NEURONTIN) 600 MG tablet   Oral   Take 1 tablet (600 mg total) by mouth 3 (three) times daily as needed.   90 tablet   6   . metoprolol succinate (TOPROL-XL) 25 MG 24 hr tablet   Oral   Take 1 tablet (25 mg total) by mouth daily.   30 tablet   6   . oxybutynin (DITROPAN) 5 MG tablet   Oral   Take 1 tablet (5 mg total) by mouth 3 (three) times daily.   90 tablet   1   . traMADol (ULTRAM) 50 MG tablet   Oral   Take 1 tablet (50 mg total) by mouth every 6 (six) hours as needed for pain.   15 tablet   0     BP 156/89  Pulse 83  Temp(Src) 98.3 F (36.8 C) (Oral)  Resp 17  Ht 6\' 4"  (1.93 m)  Wt 225 lb (102.059 kg)  BMI 27.4 kg/m2  SpO2 97%  Physical Exam  Nursing note and vitals reviewed. Constitutional: He is oriented to person, place, and time. He appears well-developed and well-nourished.  HENT:  Head: Normocephalic and atraumatic.  Eyes: EOM are normal.  Neck: Normal range of motion.  Cardiovascular: Normal rate, regular rhythm, normal heart sounds and intact distal pulses.   Pulmonary/Chest: Effort normal and breath sounds normal. No respiratory distress.  Abdominal: Soft. He exhibits no distension. There is no tenderness.  Musculoskeletal: Normal range of motion.  Neurological: He is alert and oriented to person, place, and time.  Skin: Skin is warm and dry.  Psychiatric: He has a normal mood and affect. Judgment normal.    ED Course  Procedures (including critical care time)  Labs Reviewed - No data to display No results found.   1. Chronic pain       MDM  Patient is well-known to me.  He has a history of alcohol abuse and chronic pain.  All of his issues today are related to chronic pain issues.  He is currently seeking referral to pain clinic.  Percocet times one  given for pain in the emergency department.  I am unable send the patient home with any opioid medicines.  He has a primary care physician.  The patient expresses no homicidal or suicidal thoughts to me.  He simply states he is tired of living with this amount of pain.  Please see psychiatry consult note from 2 days ago in the emergency department       Lyanne Co, MD 11/19/12 [phone]

## 2012-11-19 NOTE — ED Notes (Signed)
Pt arrived via EMS with a complaint of pain "all over" and that "i am sick of this miserable life."

## 2012-11-19 NOTE — ED Notes (Signed)
ZOX:[id number]<AV> Expected date:11/19/12<BR> Expected time: 4:34 AM<BR> Means of arrival:Ambulance<BR> Comments:<BR> RM 20: Medical clearance

## 2012-11-20 ENCOUNTER — Emergency Department (HOSPITAL_COMMUNITY)
Admission: EM | Admit: 2012-11-20 | Discharge: 2012-11-20 | Disposition: A | Discharge status: Home / Self Care | Payer: Medicaid Other | Attending: Emergency Medicine | Admitting: Emergency Medicine

## 2012-11-20 ENCOUNTER — Encounter (HOSPITAL_COMMUNITY): Payer: Self-pay | Admitting: Emergency Medicine

## 2012-11-20 ENCOUNTER — Emergency Department (HOSPITAL_COMMUNITY)
Admission: EM | Admit: 2012-11-20 | Discharge: 2012-11-20 | Discharge status: Against Medical Advice | Payer: Medicaid Other

## 2012-11-20 DIAGNOSIS — F3289 Other specified depressive episodes: Secondary | ICD-10-CM | POA: Insufficient documentation

## 2012-11-20 DIAGNOSIS — Z8619 Personal history of other infectious and parasitic diseases: Secondary | ICD-10-CM | POA: Insufficient documentation

## 2012-11-20 DIAGNOSIS — M545 Low back pain, unspecified: Secondary | ICD-10-CM | POA: Insufficient documentation

## 2012-11-20 DIAGNOSIS — G20A1 Parkinson's disease without dyskinesia, without mention of fluctuations: Secondary | ICD-10-CM | POA: Insufficient documentation

## 2012-11-20 DIAGNOSIS — G8929 Other chronic pain: Secondary | ICD-10-CM | POA: Insufficient documentation

## 2012-11-20 DIAGNOSIS — Z9889 Other specified postprocedural states: Secondary | ICD-10-CM | POA: Insufficient documentation

## 2012-11-20 DIAGNOSIS — Z8739 Personal history of other diseases of the musculoskeletal system and connective tissue: Secondary | ICD-10-CM | POA: Insufficient documentation

## 2012-11-20 DIAGNOSIS — Z79899 Other long term (current) drug therapy: Secondary | ICD-10-CM | POA: Insufficient documentation

## 2012-11-20 DIAGNOSIS — F17200 Nicotine dependence, unspecified, uncomplicated: Secondary | ICD-10-CM | POA: Insufficient documentation

## 2012-11-20 DIAGNOSIS — G20 Parkinson's disease: Secondary | ICD-10-CM | POA: Insufficient documentation

## 2012-11-20 DIAGNOSIS — I10 Essential (primary) hypertension: Secondary | ICD-10-CM | POA: Insufficient documentation

## 2012-11-20 MED ORDER — KETOROLAC TROMETHAMINE 60 MG/2ML IM SOLN
60.0000 mg | Freq: Once | INTRAMUSCULAR | Status: AC
  Administered 2012-11-20: 60 mg via INTRAMUSCULAR
  Filled 2012-11-20: qty 2

## 2012-11-20 NOTE — ED Notes (Signed)
Pt transported from fire station # 8, pts WC left at fire station. Pt c/o chronic back pain worse today.

## 2012-11-20 NOTE — ED Provider Notes (Signed)
Medical screening examination/treatment/procedure(s) were conducted as a shared visit with non-physician practitioner(s) and myself.  I personally evaluated the patient during the encounter  No HI or SI. Now clinically sober. Dc home  Lyanne Co, MD 11/20/12 [phone]

## 2012-11-20 NOTE — Progress Notes (Signed)
CM informed by ED charge RN that pt d/c this am but did not have loaner w/c Reported pt stated w/c at [employer] station #8 on [address] [phone] CM went to speak with pt at 1130 in ed lobby but was taken by [employer] to fire station to retrieve w/c No answer at [phone] after 1130 after multiple attempts to reach staff at fire station

## 2012-11-20 NOTE — ED Provider Notes (Signed)
Medical screening examination/treatment/procedure(s) were conducted as a shared visit with non-physician practitioner(s) and myself.  I personally evaluated the patient during the encounter  Jeffrey Langenderfer M Skippy Marhefka, MD 11/20/12 0755 

## 2012-11-20 NOTE — ED Provider Notes (Signed)
History     CSN: [id number]  Arrival date & time 11/20/12  4098   First MD Initiated Contact with Patient 11/20/12 [phone]      Chief Complaint  Patient presents with  . Back Pain    (Consider location/radiation/quality/duration/timing/severity/associated sxs/prior treatment) HPI Comments: Patient with history of chronic low back and knee pain with frequent visits to the ED.  Here each of the past two days and several more times in the past two weeks.  He has a pcp who he tells me has stopped his narcotic pain medications and he has been having severe pain since that time.  He denies any bowel or bladder complaints.    Patient is a 57 y.o. male presenting with back pain. The history is provided by the patient.  Back Pain Location:  Lumbar spine Quality:  Shooting Radiates to: both legs. Pain severity:  Severe Pain is:  Same all the time Onset quality:  Gradual Timing:  Constant Progression:  Unchanged Worsened by:  Nothing tried Ineffective treatments:  None tried Associated symptoms: no bladder incontinence, no bowel incontinence, no fever and no weakness     Past Medical History  Diagnosis Date  . DDD (degenerative disc disease)   . Necrotizing fasciitis   . Degenerative disc disease   . Hypertension   . Degenerative disc disease   . Hepatitis C   . Chronic pain   . DDD (degenerative disc disease)   . Parkinson disease   . Mental disorder   . Depression   . DDD (degenerative disc disease)     Past Surgical History  Procedure Laterality Date  . Hip surgery    . Appendectomy    . Ankle surgery      No family history on file.  History  Substance Use Topics  . Smoking status: Current Every Day Smoker -- 1.00 packs/day for 36 years    Types: Cigarettes  . Smokeless tobacco: Former Neurosurgeon    Types: Chew  . Alcohol Use: Yes      Review of Systems  Constitutional: Negative for fever and fatigue.  Gastrointestinal: Negative.  Negative for bowel incontinence.   Genitourinary: Negative.  Negative for bladder incontinence.  Musculoskeletal: Positive for back pain.  Neurological: Negative for weakness.  All other systems reviewed and are negative.    Allergies  Review of patient's allergies indicates no known allergies.  Home Medications   Current Outpatient Rx  Name  Route  Sig  Dispense  Refill  . gabapentin (NEURONTIN) 600 MG tablet   Oral   Take 1 tablet (600 mg total) by mouth 3 (three) times daily as needed.   90 tablet   6   . metoprolol succinate (TOPROL-XL) 25 MG 24 hr tablet   Oral   Take 1 tablet (25 mg total) by mouth daily.   30 tablet   6   . oxybutynin (DITROPAN) 5 MG tablet   Oral   Take 1 tablet (5 mg total) by mouth 3 (three) times daily.   90 tablet   1   . traMADol (ULTRAM) 50 MG tablet   Oral   Take 1 tablet (50 mg total) by mouth every 6 (six) hours as needed for pain.   15 tablet   0     BP 162/110  Pulse 107  Temp(Src) 98.1 F (36.7 C) (Oral)  Resp 18  SpO2 100%  Physical Exam  Nursing note and vitals reviewed. Constitutional: He is oriented to person, place, and time.  He appears well-developed and well-nourished.  Patient is somewhat unkempt, odor of alcohol present.  HENT:  Head: Normocephalic.  Neck: Normal range of motion. Neck supple.  Musculoskeletal: Normal range of motion. He exhibits no edema.  There is ttp in the soft tissues of the lumbar spine.  There is no bony ttp or stepoffs.  Neurological: He is alert and oriented to person, place, and time.  Strength is 5/5 in the ble, DTR's are 1+ and equal in the ble.  Ambulatory without difficulty.  Skin: Skin is warm and dry.    ED Course  Procedures (including critical care time)  Labs Reviewed - No data to display No results found.   No diagnosis found.    MDM  Will give an injection of toradol.  After seeing the patient and reviewing his prior record, I am uncomfortable with prescribing narcotic pain medications.  He  needs to discuss his chronic pain issues with his pcp.  He tells me he is awaiting referral to pain management.  Continue tramadol until then.        Geoffery Lyons, MD 11/20/12 [phone]

## 2012-11-20 NOTE — ED Notes (Signed)
Pt brought back in by [facility] d/t pt not accepted at the resident that he returned to. Pt states "I'm homeless I need to get my wheelchair at the fire station", pt states "I need my social worker to get me a cab", pt notified that our SW here had worked with him yesterday and he is unable to receive anymore. Pt was given a list of resources and sent out in the waiting room to use the phone for find him a ride home. Pt denies medical reasons for being here.

## 2012-11-20 NOTE — ED Notes (Signed)
Patient given a paper with homeless shelters and day center address and phone numbers.

## 2012-11-21 ENCOUNTER — Telehealth: Payer: Self-pay | Admitting: Family Medicine

## 2012-11-21 NOTE — Telephone Encounter (Signed)
Pt is asking for [place] to send FL2 to [facility] - their phn # is [phone] He is homeless again

## 2012-11-22 ENCOUNTER — Ambulatory Visit: Payer: Self-pay | Admitting: Family Medicine

## 2012-11-26 ENCOUNTER — Encounter (HOSPITAL_COMMUNITY): Admission: EM | Disposition: A | Discharge status: Home Health | Payer: Self-pay | Source: Home / Self Care

## 2012-11-26 ENCOUNTER — Emergency Department (HOSPITAL_COMMUNITY): Payer: Medicaid Other

## 2012-11-26 ENCOUNTER — Encounter (HOSPITAL_COMMUNITY): Payer: Self-pay | Admitting: Anesthesiology

## 2012-11-26 ENCOUNTER — Emergency Department (HOSPITAL_COMMUNITY): Payer: Medicaid Other | Admitting: Anesthesiology

## 2012-11-26 ENCOUNTER — Inpatient Hospital Stay (HOSPITAL_COMMUNITY)
Admission: EM | Admit: 2012-11-26 | Discharge: 2012-12-05 | DRG: 907 | Disposition: A | Discharge status: Home Health | Payer: Medicaid Other | Attending: General Surgery | Admitting: General Surgery

## 2012-11-26 ENCOUNTER — Inpatient Hospital Stay (HOSPITAL_COMMUNITY): Payer: Medicaid Other

## 2012-11-26 DIAGNOSIS — M51379 Other intervertebral disc degeneration, lumbosacral region without mention of lumbar back pain or lower extremity pain: Secondary | ICD-10-CM | POA: Diagnosis present

## 2012-11-26 DIAGNOSIS — S21209A Unspecified open wound of unspecified back wall of thorax without penetration into thoracic cavity, initial encounter: Secondary | ICD-10-CM

## 2012-11-26 DIAGNOSIS — R599 Enlarged lymph nodes, unspecified: Secondary | ICD-10-CM | POA: Diagnosis present

## 2012-11-26 DIAGNOSIS — G8929 Other chronic pain: Secondary | ICD-10-CM | POA: Diagnosis present

## 2012-11-26 DIAGNOSIS — R680 Hypothermia, not associated with low environmental temperature: Secondary | ICD-10-CM | POA: Diagnosis present

## 2012-11-26 DIAGNOSIS — D62 Acute posthemorrhagic anemia: Secondary | ICD-10-CM | POA: Diagnosis not present

## 2012-11-26 DIAGNOSIS — M5137 Other intervertebral disc degeneration, lumbosacral region: Secondary | ICD-10-CM | POA: Diagnosis present

## 2012-11-26 DIAGNOSIS — F1010 Alcohol abuse, uncomplicated: Secondary | ICD-10-CM | POA: Diagnosis present

## 2012-11-26 DIAGNOSIS — J95821 Acute postprocedural respiratory failure: Secondary | ICD-10-CM | POA: Diagnosis not present

## 2012-11-26 DIAGNOSIS — R339 Retention of urine, unspecified: Secondary | ICD-10-CM | POA: Diagnosis present

## 2012-11-26 DIAGNOSIS — S7010XA Contusion of unspecified thigh, initial encounter: Secondary | ICD-10-CM | POA: Diagnosis present

## 2012-11-26 DIAGNOSIS — J9600 Acute respiratory failure, unspecified whether with hypoxia or hypercapnia: Secondary | ICD-10-CM | POA: Diagnosis present

## 2012-11-26 DIAGNOSIS — R4182 Altered mental status, unspecified: Secondary | ICD-10-CM | POA: Diagnosis present

## 2012-11-26 DIAGNOSIS — S21219A Laceration without foreign body of unspecified back wall of thorax without penetration into thoracic cavity, initial encounter: Secondary | ICD-10-CM

## 2012-11-26 DIAGNOSIS — S21212A Laceration without foreign body of left back wall of thorax without penetration into thoracic cavity, initial encounter: Secondary | ICD-10-CM

## 2012-11-26 DIAGNOSIS — T148XXA Other injury of unspecified body region, initial encounter: Principal | ICD-10-CM | POA: Diagnosis present

## 2012-11-26 HISTORY — PX: WOUND EXPLORATION: SHX6188

## 2012-11-26 HISTORY — PX: INCISION AND DRAINAGE OF WOUND: SHX1803

## 2012-11-26 LAB — POCT I-STAT, CHEM 8
BUN: 20 mg/dL (ref 6–23)
Chloride: 109 mEq/L (ref 96–112)
Creatinine, Ser: 1.4 mg/dL — ABNORMAL HIGH (ref 0.50–1.35)
Glucose, Bld: 110 mg/dL — ABNORMAL HIGH (ref 70–99)
Potassium: 3.3 mEq/L — ABNORMAL LOW (ref 3.5–5.1)
Sodium: 145 mEq/L (ref 135–145)

## 2012-11-26 LAB — CBC
HCT: 29.4 % — ABNORMAL LOW (ref 39.0–52.0)
MCV: 89.8 fL (ref 78.0–100.0)
Platelets: 292 10*3/uL (ref 150–400)
RDW: 14 % (ref 11.5–15.5)
RDW: 14.6 % (ref 11.5–15.5)
WBC: 12.8 10*3/uL — ABNORMAL HIGH (ref 4.0–10.5)
WBC: 9.2 10*3/uL (ref 4.0–10.5)

## 2012-11-26 LAB — ETHANOL: Alcohol, Ethyl (B): 393 mg/dL — ABNORMAL HIGH (ref 0–11)

## 2012-11-26 LAB — GLUCOSE, CAPILLARY: Glucose-Capillary: 131 mg/dL — ABNORMAL HIGH (ref 70–99)

## 2012-11-26 LAB — COMPREHENSIVE METABOLIC PANEL
Albumin: 3.6 g/dL (ref 3.5–5.2)
Alkaline Phosphatase: 113 U/L (ref 39–117)
BUN: 20 mg/dL (ref 6–23)
CO2: 25 mEq/L (ref 19–32)
Chloride: 104 mEq/L (ref 96–112)
Creatinine, Ser: 0.96 mg/dL (ref 0.50–1.35)
GFR calc Af Amer: >90 mL/min (ref >90)
GFR calc non Af Amer: >90 mL/min (ref >90)
Glucose, Bld: 111 mg/dL — ABNORMAL HIGH (ref 70–99)
Potassium: 3.3 mEq/L — ABNORMAL LOW (ref 3.5–5.1)
Total Bilirubin: 0.2 mg/dL — ABNORMAL LOW (ref 0.3–1.2)

## 2012-11-26 LAB — MRSA PCR SCREENING: MRSA by PCR: NEGATIVE

## 2012-11-26 LAB — BLOOD GAS, ARTERIAL
Acid-base deficit: 9.4 mmol/L — ABNORMAL HIGH (ref 0.0–2.0)
Drawn by: 12971
FIO2: 0.6 %
TCO2: 18.4 mmol/L (ref 0–100)
pCO2 arterial: 38.8 mmHg (ref 35.0–45.0)
pO2, Arterial: 134 mmHg — ABNORMAL HIGH (ref 80.0–100.0)

## 2012-11-26 LAB — BASIC METABOLIC PANEL
GFR calc Af Amer: >90 mL/min (ref >90)
GFR calc non Af Amer: >90 mL/min (ref >90)
Potassium: 2.9 mEq/L — ABNORMAL LOW (ref 3.5–5.1)
Sodium: 146 mEq/L — ABNORMAL HIGH (ref 135–145)

## 2012-11-26 LAB — URINALYSIS, MICROSCOPIC ONLY
Bilirubin Urine: NEGATIVE
Ketones, ur: 15 mg/dL — AB
Leukocytes, UA: NEGATIVE
Nitrite: NEGATIVE
Specific Gravity, Urine: >1.046 — ABNORMAL HIGH (ref 1.005–1.030)
Urobilinogen, UA: 0.2 mg/dL (ref 0.0–1.0)

## 2012-11-26 SURGERY — WOUND EXPLORATION
Anesthesia: General | Wound class: Dirty or Infected

## 2012-11-26 MED ORDER — MIDAZOLAM HCL 2 MG/2ML IJ SOLN
INTRAMUSCULAR | Status: AC
  Administered 2012-11-26: 2 mg
  Filled 2012-11-26: qty 10

## 2012-11-26 MED ORDER — CEFAZOLIN SODIUM-DEXTROSE 2-3 GM-% IV SOLR
INTRAVENOUS | Status: DC | PRN
  Administered 2012-11-26: 2 g via INTRAVENOUS

## 2012-11-26 MED ORDER — CHLORHEXIDINE GLUCONATE 0.12 % MT SOLN
15.0000 mL | Freq: Two times a day (BID) | OROMUCOSAL | Status: DC
  Administered 2012-11-26 – 2012-11-27 (×2): 15 mL via OROMUCOSAL
  Filled 2012-11-26 (×2): qty 15

## 2012-11-26 MED ORDER — FENTANYL CITRATE 0.05 MG/ML IJ SOLN
INTRAMUSCULAR | Status: DC | PRN
  Administered 2012-11-26 (×5): 50 ug via INTRAVENOUS

## 2012-11-26 MED ORDER — PANTOPRAZOLE SODIUM 40 MG IV SOLR
40.0000 mg | Freq: Every day | INTRAVENOUS | Status: DC
  Administered 2012-11-26: 40 mg via INTRAVENOUS
  Filled 2012-11-26 (×4): qty 40

## 2012-11-26 MED ORDER — FENTANYL CITRATE 0.05 MG/ML IJ SOLN
INTRAMUSCULAR | Status: AC
  Filled 2012-11-26: qty 10

## 2012-11-26 MED ORDER — MIDAZOLAM HCL 2 MG/2ML IJ SOLN
2.0000 mg | Freq: Once | INTRAMUSCULAR | Status: AC
  Administered 2012-11-26: 2 mg via INTRAVENOUS

## 2012-11-26 MED ORDER — CEFAZOLIN SODIUM-DEXTROSE 2-3 GM-% IV SOLR
2.0000 g | Freq: Three times a day (TID) | INTRAVENOUS | Status: DC
  Administered 2012-11-26 – 2012-11-28 (×6): 2 g via INTRAVENOUS
  Filled 2012-11-26 (×7): qty 50

## 2012-11-26 MED ORDER — PROPOFOL 10 MG/ML IV EMUL
5.0000 ug/kg/min | INTRAVENOUS | Status: DC
  Administered 2012-11-26: 30 ug/kg/min via INTRAVENOUS

## 2012-11-26 MED ORDER — PROPOFOL INFUSION 10 MG/ML OPTIME
INTRAVENOUS | Status: DC | PRN
  Administered 2012-11-26: 50 ug/kg/min via INTRAVENOUS

## 2012-11-26 MED ORDER — ROCURONIUM BROMIDE 100 MG/10ML IV SOLN
INTRAVENOUS | Status: DC | PRN
  Administered 2012-11-26 (×2): 50 mg via INTRAVENOUS

## 2012-11-26 MED ORDER — ONDANSETRON HCL 4 MG/2ML IJ SOLN
4.0000 mg | Freq: Four times a day (QID) | INTRAMUSCULAR | Status: DC | PRN
  Administered 2012-11-29: 4 mg via INTRAVENOUS
  Filled 2012-11-26: qty 2

## 2012-11-26 MED ORDER — ROCURONIUM BROMIDE 50 MG/5ML IV SOLN
INTRAVENOUS | Status: AC
  Filled 2012-11-26: qty 2

## 2012-11-26 MED ORDER — ONDANSETRON HCL 4 MG PO TABS: 4.0000 mg | ORAL_TABLET | Freq: Four times a day (QID) | ORAL | Status: DC | PRN

## 2012-11-26 MED ORDER — SODIUM CHLORIDE 0.9 % IV SOLN
INTRAVENOUS | Status: DC | PRN
  Administered 2012-11-26 (×2): via INTRAVENOUS

## 2012-11-26 MED ORDER — ALBUMIN HUMAN 5 % IV SOLN: 12.5000 g | Freq: Once | INTRAVENOUS | Status: AC

## 2012-11-26 MED ORDER — ETOMIDATE 2 MG/ML IV SOLN
INTRAVENOUS | Status: AC
  Administered 2012-11-26: 20 mg
  Filled 2012-11-26: qty 20

## 2012-11-26 MED ORDER — BIOTENE DRY MOUTH MT LIQD
15.0000 mL | Freq: Four times a day (QID) | OROMUCOSAL | Status: DC
  Administered 2012-11-26 – 2012-11-27 (×5): 15 mL via OROMUCOSAL

## 2012-11-26 MED ORDER — SODIUM CHLORIDE 0.9 % IV SOLN
INTRAVENOUS | Status: DC
  Administered 2012-11-27 – 2012-11-28 (×2): via INTRAVENOUS

## 2012-11-26 MED ORDER — MIDAZOLAM HCL 5 MG/5ML IJ SOLN
INTRAMUSCULAR | Status: DC | PRN
  Administered 2012-11-26: 2 mg via INTRAVENOUS

## 2012-11-26 MED ORDER — ALBUMIN HUMAN 5 % IV SOLN
INTRAVENOUS | Status: DC | PRN
  Administered 2012-11-26: 05:00:00 via INTRAVENOUS

## 2012-11-26 MED ORDER — PHENYLEPHRINE HCL 10 MG/ML IJ SOLN
INTRAMUSCULAR | Status: DC | PRN
  Administered 2012-11-26: 120 ug via INTRAVENOUS
  Administered 2012-11-26: 80 ug via INTRAVENOUS

## 2012-11-26 MED ORDER — LIDOCAINE HCL (CARDIAC) 20 MG/ML IV SOLN
INTRAVENOUS | Status: AC
  Filled 2012-11-26: qty 5

## 2012-11-26 MED ORDER — SODIUM CHLORIDE 0.9 % IV BOLUS (SEPSIS)
1000.0000 mL | Freq: Once | INTRAVENOUS | Status: AC
  Administered 2012-11-26: 1000 mL via INTRAVENOUS

## 2012-11-26 MED ORDER — FENTANYL BOLUS VIA INFUSION
25.0000 ug | Freq: Four times a day (QID) | INTRAVENOUS | Status: DC | PRN
  Filled 2012-11-26: qty 100

## 2012-11-26 MED ORDER — POTASSIUM CHLORIDE 10 MEQ/100ML IV SOLN
10.0000 meq | INTRAVENOUS | Status: AC
  Administered 2012-11-26 (×3): 10 meq via INTRAVENOUS
  Filled 2012-11-26: qty 100
  Filled 2012-11-26: qty 300

## 2012-11-26 MED ORDER — ALBUMIN HUMAN 5 % IV SOLN
INTRAVENOUS | Status: AC
  Administered 2012-11-26: 12.5 g via INTRAVENOUS
  Filled 2012-11-26: qty 250

## 2012-11-26 MED ORDER — PANTOPRAZOLE SODIUM 40 MG PO TBEC
40.0000 mg | DELAYED_RELEASE_TABLET | Freq: Every day | ORAL | Status: DC
  Administered 2012-11-27: 40 mg via ORAL
  Filled 2012-11-26: qty 1

## 2012-11-26 MED ORDER — PROPOFOL 10 MG/ML IV EMUL
5.0000 ug/kg/min | INTRAVENOUS | Status: DC
  Administered 2012-11-26 – 2012-11-27 (×4): 40 ug/kg/min via INTRAVENOUS
  Filled 2012-11-26 (×6): qty 100

## 2012-11-26 MED ORDER — MIDAZOLAM HCL 2 MG/2ML IJ SOLN: 0.5000 mg | Freq: Once | INTRAMUSCULAR | Status: AC | PRN

## 2012-11-26 MED ORDER — SODIUM CHLORIDE 0.9 % IV SOLN
25.0000 ug/h | INTRAVENOUS | Status: DC
  Administered 2012-11-26: 25 ug/h via INTRAVENOUS
  Administered 2012-11-27: 150 ug/h via INTRAVENOUS
  Filled 2012-11-26 (×2): qty 50

## 2012-11-26 MED ORDER — SUCCINYLCHOLINE CHLORIDE 20 MG/ML IJ SOLN
INTRAMUSCULAR | Status: AC
  Administered 2012-11-26: 100 mg
  Filled 2012-11-26: qty 1

## 2012-11-26 MED ORDER — ONDANSETRON HCL 4 MG/2ML IJ SOLN
4.0000 mg | Freq: Once | INTRAMUSCULAR | Status: AC | PRN
  Filled 2012-11-26: qty 2

## 2012-11-26 MED ORDER — LACTATED RINGERS IV SOLN
INTRAVENOUS | Status: DC | PRN
  Administered 2012-11-26: 05:00:00 via INTRAVENOUS

## 2012-11-26 MED ORDER — HYDROMORPHONE HCL PF 1 MG/ML IJ SOLN: 0.2500 mg | INTRAMUSCULAR | Status: DC | PRN

## 2012-11-26 MED ORDER — IOHEXOL 350 MG/ML SOLN
100.0000 mL | Freq: Once | INTRAVENOUS | Status: AC | PRN
  Administered 2012-11-26: 100 mL via INTRAVENOUS

## 2012-11-26 SURGICAL SUPPLY — 34 items
BANDAGE GAUZE ELAST BULKY 4 IN (GAUZE/BANDAGES/DRESSINGS) ×1 IMPLANT
CLOTH BEACON ORANGE TIMEOUT ST (SAFETY) ×2 IMPLANT
COVER SURGICAL LIGHT HANDLE (MISCELLANEOUS) ×2 IMPLANT
DRSG PAD ABDOMINAL 8X10 ST (GAUZE/BANDAGES/DRESSINGS) ×1 IMPLANT
ELECT BLADE 4.0 EZ CLEAN MEGAD (MISCELLANEOUS) ×2
ELECT REM PT RETURN 9FT ADLT (ELECTROSURGICAL) ×2
ELECTRODE BLDE 4.0 EZ CLN MEGD (MISCELLANEOUS) IMPLANT
ELECTRODE REM PT RTRN 9FT ADLT (ELECTROSURGICAL) ×1 IMPLANT
GLOVE BIO SURGEON STRL SZ7 (GLOVE) ×2 IMPLANT
GLOVE BIO SURGEON STRL SZ7.5 (GLOVE) ×1 IMPLANT
GLOVE BIOGEL PI IND STRL 6.5 (GLOVE) IMPLANT
GLOVE BIOGEL PI IND STRL 7.5 (GLOVE) ×1 IMPLANT
GLOVE BIOGEL PI INDICATOR 6.5 (GLOVE) ×1
GLOVE BIOGEL PI INDICATOR 7.5 (GLOVE) ×1
GOWN STRL NON-REIN LRG LVL3 (GOWN DISPOSABLE) ×3 IMPLANT
GOWN STRL REIN 3XL XLG LVL4 (GOWN DISPOSABLE) ×1 IMPLANT
HEMOSTAT SNOW SURGICEL 2X4 (HEMOSTASIS) ×4 IMPLANT
KIT BASIN OR (CUSTOM PROCEDURE TRAY) ×2 IMPLANT
KIT ROOM TURNOVER OR (KITS) ×2 IMPLANT
NS IRRIG 1000ML POUR BTL (IV SOLUTION) ×2 IMPLANT
PACK GENERAL/GYN (CUSTOM PROCEDURE TRAY) ×1 IMPLANT
PAD ARMBOARD 7.5X6 YLW CONV (MISCELLANEOUS) ×2 IMPLANT
PENCIL BUTTON HOLSTER BLD 10FT (ELECTRODE) ×2 IMPLANT
SPECIMEN JAR SMALL (MISCELLANEOUS) IMPLANT
SPONGE GAUZE 4X4 12PLY (GAUZE/BANDAGES/DRESSINGS) ×1 IMPLANT
SPONGE LAP 18X18 X RAY DECT (DISPOSABLE) ×2 IMPLANT
STAPLER VISISTAT 35W (STAPLE) ×2 IMPLANT
SUT MNCRL AB 4-0 PS2 18 (SUTURE) ×2 IMPLANT
SUT SILK 2 0 SH CR/8 (SUTURE) ×1 IMPLANT
SUT VIC AB 3-0 SH 27 (SUTURE) ×2
SUT VIC AB 3-0 SH 27XBRD (SUTURE) ×1 IMPLANT
TAPE CLOTH SURG 4X10 WHT LF (GAUZE/BANDAGES/DRESSINGS) ×1 IMPLANT
TOWEL OR 17X24 6PK STRL BLUE (TOWEL DISPOSABLE) ×2 IMPLANT
TOWEL OR 17X26 10 PK STRL BLUE (TOWEL DISPOSABLE) ×2 IMPLANT

## 2012-11-26 NOTE — ED Notes (Signed)
Pt received first unit of O Neg blood.

## 2012-11-26 NOTE — ED Notes (Signed)
Pt to CT scanner, EDP, Trauma MD, RT, RN, NT with pt in scanner. Pt received first unit of blood and switched to NS.
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
## 2012-11-26 NOTE — ED Notes (Signed)
Pt received warm blankets. CT 1 ready for pt.

## 2012-11-26 NOTE — Progress Notes (Signed)
Pt alert and combative.  Attempted to address pt's needs without success.  Pt visibly agitated.  Attempting to use sign language to communicate but staff is not familiar with signs.  Attempted to reorient without success.  Pt repeatedly attempting to rip bilateral wrist restraints from bed.  Attempting to pull on foley and reaching for ETT.  Repositioned.  Will continue to closely monitor.  Dameon Soltis, [facility]
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
## 2012-11-26 NOTE — ED Notes (Signed)
Pt remains combative and aggressive. Pt refusing to answer EDP or Trauma MD questions. Pt attempting to move all over stretcher and will no remain still to look at back. Pt continues to bleed, without allowing staff to apply pressure to laceration.

## 2012-11-26 NOTE — Anesthesia Postprocedure Evaluation (Signed)
  Anesthesia Post-op Note  Patient: Jeffrey [name]  Procedure(s) Performed: Procedure(s): IRRIGATION AND DEBRIDEMENT WOUND (N/A) WOUND EXPLORATION (N/A)  Patient Location: PACU  Anesthesia Type:General  Level of Consciousness: sedated and Patient remains intubated per anesthesia plan  Airway and Oxygen Therapy: Patient remains intubated per anesthesia plan  Post-op Pain: none  Post-op Assessment: Post-op Vital signs reviewed and Patient's Cardiovascular Status Stable  Post-op Vital Signs: stable  Complications: No apparent anesthesia complications
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
## 2012-11-27 ENCOUNTER — Encounter (HOSPITAL_COMMUNITY): Payer: Self-pay | Admitting: General Surgery

## 2012-11-27 ENCOUNTER — Inpatient Hospital Stay (HOSPITAL_COMMUNITY): Payer: Medicaid Other

## 2012-11-27 DIAGNOSIS — J95821 Acute postprocedural respiratory failure: Secondary | ICD-10-CM

## 2012-11-27 DIAGNOSIS — J9600 Acute respiratory failure, unspecified whether with hypoxia or hypercapnia: Secondary | ICD-10-CM | POA: Diagnosis present

## 2012-11-27 DIAGNOSIS — S21219A Laceration without foreign body of unspecified back wall of thorax without penetration into thoracic cavity, initial encounter: Secondary | ICD-10-CM

## 2012-11-27 DIAGNOSIS — D62 Acute posthemorrhagic anemia: Secondary | ICD-10-CM | POA: Diagnosis not present

## 2012-11-27 LAB — BASIC METABOLIC PANEL
BUN: 17 mg/dL (ref 6–23)
Chloride: 112 mEq/L (ref 96–112)
Creatinine, Ser: 0.78 mg/dL (ref 0.50–1.35)
GFR calc Af Amer: >90 mL/min (ref >90)
GFR calc non Af Amer: >90 mL/min (ref >90)
Glucose, Bld: 90 mg/dL (ref 70–99)

## 2012-11-27 LAB — CBC
HCT: 28.2 % — ABNORMAL LOW (ref 39.0–52.0)
MCHC: 34.4 g/dL (ref 30.0–36.0)
MCV: 90.7 fL (ref 78.0–100.0)
RDW: 15.2 % (ref 11.5–15.5)

## 2012-11-27 MED ORDER — NICOTINE 21 MG/24HR TD PT24
21.0000 mg | MEDICATED_PATCH | Freq: Every day | TRANSDERMAL | Status: DC
Start: 2012-11-27 — End: 2012-12-05
  Administered 2012-11-27 – 2012-12-04 (×8): 21 mg via TRANSDERMAL
  Filled 2012-11-27 (×9): qty 1

## 2012-11-27 MED ORDER — FENTANYL CITRATE 0.05 MG/ML IJ SOLN
50.0000 ug | INTRAMUSCULAR | Status: DC | PRN
  Administered 2012-11-27 – 2012-11-28 (×15): 100 ug via INTRAVENOUS
  Filled 2012-11-27 (×15): qty 2

## 2012-11-27 NOTE — Progress Notes (Signed)
UR complete 

## 2012-11-27 NOTE — Progress Notes (Signed)
Patient c/o of numbness of right leg from knee down and cramping of right thigh Dr wyatt informed

## 2012-11-27 NOTE — Progress Notes (Signed)
Follow up - Trauma and Critical Care  Patient Details:    Jeffrey [name] is an 57 y.o. male.  Lines/tubes : Airway 7.5 mm (Active)  Secured at (cm) 23 cm 11/27/2012  4:00 AM  Measured From Lips 11/27/2012  4:00 AM  Secured Location Left 11/27/2012  4:00 AM  Secured By [employer] 11/27/2012  4:00 AM  Tube Holder Repositioned Yes 11/27/2012  4:00 AM  Cuff Pressure (cm H2O) 30 cm H2O 11/27/2012  4:00 AM  Site Condition Dry 11/26/2012  4:03 PM     NG/OG Tube Orogastric 18 Fr. Center mouth (Active)  Placement Verification Auscultation 11/26/2012  7:00 PM  Site Assessment Clean;Intact;Dry 11/26/2012  7:00 PM  Status Suction-low intermittent 11/26/2012  7:00 PM  Drainage Appearance Yellow 11/26/2012  7:00 PM  Intake (mL) 30 mL 11/27/2012  4:00 AM  Output (mL) 200 mL 11/27/2012  6:00 AM     Urethral Catheter Temperature probe 16 Fr. (Active)  Indication for Insertion or Continuance of Catheter Urinary output monitoring 11/26/2012  7:00 PM  Site Assessment Clean;Intact 11/26/2012  7:00 PM  Collection Container Standard drainage bag 11/26/2012  7:00 PM  Securement Method Leg strap 11/26/2012  7:00 PM  Urinary Catheter Interventions Unclamped 11/26/2012  7:00 PM  Output (mL) 75 mL 11/27/2012  6:00 AM    Microbiology/Sepsis markers: Results for orders placed during the hospital encounter of 11/26/12  MRSA PCR SCREENING     Status: None   Collection Time    11/26/12  8:44 AM      Result Value Range Status   MRSA by PCR NEGATIVE  NEGATIVE Final   Comment:            The GeneXpert MRSA Assay (FDA     approved for NASAL specimens     only), is one component of a     comprehensive MRSA colonization     surveillance program. It is not     intended to diagnose MRSA     infection nor to guide or     monitor treatment for     MRSA infections.    Anti-infectives:  Anti-infectives   Start     Dose/Rate Route Frequency Ordered Stop   11/26/12 1400  ceFAZolin (ANCEF) IVPB 2 g/50 mL premix      2 g 100 mL/hr over 30 Minutes Intravenous 3 times per day 11/26/12 0617        Best Practice/Protocols:  VTE Prophylaxis: Mechanical GI Prophylaxis: Proton Pump Inhibitor Continous Sedation  Consults:      Events:  Subjective:    Overnight Issues: Patient has been very combative and rude by report.  Now begging to be extubated  Objective:  Vital signs for last 24 hours: Temp:  [91.4 F (33 C)-100.4 F (38 C)] 98.5 F (36.9 C) (03/24 0734) Pulse Rate:  [64-104] 70 (03/24 0700) Resp:  [13-25] 14 (03/24 0700) BP: (69-126)/(40-92) 119/68 mmHg (03/24 0700) SpO2:  [99 %-100 %] 100 % (03/24 0700) FiO2 (%):  [40 %-60 %] 40 % (03/24 0700) Weight:  [104.781 kg (231 lb)] 104.781 kg (231 lb) (03/23 0817)  Hemodynamic parameters for last 24 hours:    Intake/Output from previous day: 03/23 0701 - 03/24 0700 In: 4215.6 [I.V.:3688.9; NG/GT:60; IV Piggyback:466.7] Out: 2165 [Urine:1965; Emesis/NG output:200]  Intake/Output this shift:    Vent settings for last 24 hours: Vent Mode:  [-] PRVC FiO2 (%):  [40 %-60 %] 40 % Set Rate:  [14 bmp] 14 bmp Vt Set:  [  480 mL] 480 mL PEEP:  [5 cmH20] 5 cmH20 Plateau Pressure:  [10 cmH20-14 cmH20] 14 cmH20  Physical Exam:  General: alert and no respiratory distress Neuro: alert, oriented and nonfocal exam Resp: clear to auscultation bilaterally GI: soft, nontender, BS WNL, no r/g and pointing to his right lower abdomen as spot where he has some tenderness. Extremities: no edema, no erythema, pulses WNL  Results for orders placed during the hospital encounter of 11/26/12 (from the past 24 hour(s))  MRSA PCR SCREENING     Status: None   Collection Time    11/26/12  8:44 AM      Result Value Range   MRSA by PCR NEGATIVE  NEGATIVE  URINALYSIS, MICROSCOPIC ONLY     Status: Abnormal   Collection Time    11/26/12 11:16 AM      Result Value Range   Color, Urine YELLOW  YELLOW   APPearance CLEAR  CLEAR   Specific Gravity, Urine >1.046  (*) 1.005 - 1.030   pH 5.5  5.0 - 8.0   Glucose, UA NEGATIVE  NEGATIVE mg/dL   Hgb urine dipstick NEGATIVE  NEGATIVE   Bilirubin Urine NEGATIVE  NEGATIVE   Ketones, ur 15 (*) NEGATIVE mg/dL   Protein, ur 30 (*) NEGATIVE mg/dL   Urobilinogen, UA 0.2  0.0 - 1.0 mg/dL   Nitrite NEGATIVE  NEGATIVE   Leukocytes, UA NEGATIVE  NEGATIVE   Squamous Epithelial / LPF FEW (*) RARE   Casts GRANULAR CAST (*) NEGATIVE  CBC     Status: Abnormal   Collection Time    11/26/12  2:10 PM      Result Value Range   WBC 9.2  4.0 - 10.5 K/uL   RBC 3.31 (*) 4.22 - 5.81 MIL/uL   Hemoglobin 10.1 (*) 13.0 - 17.0 g/dL   HCT 16.1 (*) 09.6 - 04.5 %   MCV 88.8  78.0 - 100.0 fL   MCH 30.5  26.0 - 34.0 pg   MCHC 34.4  30.0 - 36.0 g/dL   RDW 40.9  81.1 - 91.4 %   Platelets 193  150 - 400 K/uL  CBC     Status: Abnormal   Collection Time    11/27/12  4:25 AM      Result Value Range   WBC 6.1  4.0 - 10.5 K/uL   RBC 3.11 (*) 4.22 - 5.81 MIL/uL   Hemoglobin 9.7 (*) 13.0 - 17.0 g/dL   HCT 78.2 (*) 95.6 - 21.3 %   MCV 90.7  78.0 - 100.0 fL   MCH 31.2  26.0 - 34.0 pg   MCHC 34.4  30.0 - 36.0 g/dL   RDW 08.6  57.8 - 46.9 %   Platelets 151  150 - 400 K/uL  BASIC METABOLIC PANEL     Status: Abnormal   Collection Time    11/27/12  4:25 AM      Result Value Range   Sodium 144  135 - 145 mEq/L   Potassium 3.5  3.5 - 5.1 mEq/L   Chloride 112  96 - 112 mEq/L   CO2 25  19 - 32 mEq/L   Glucose, Bld 90  70 - 99 mg/dL   BUN 17  6 - 23 mg/dL   Creatinine, Ser 6.29  0.50 - 1.35 mg/dL   Calcium 7.5 (*) 8.4 - 10.5 mg/dL   GFR calc non Af Amer >90  >90 mL/min   GFR calc Af Amer >90  >90 mL/min  Assessment/Plan:   NEURO  Altered Mental Status:  agitation and seems to be related to ventilator at this time.   Plan: Wean sedation so that we can extubate this patient.  PULM  No issues   Plan: CPM  CARDIO  No issues   Plan: CPM  RENAL  Urine output is good.   Plan: CPM  GI  No known GI problems   Plan: CPM.   Will recheck scans because of patient's complaint of abdominal sourness in RLQ  ID  No known infectious problems   Plan: CPM  HEME  Anemia acute blood loss anemia)   Plan: No blood needed currently  ENDO No issues   Plan: CPM  Global Issues  The patient has been disruptive and belligerent Should be extubated soon.  Will examine back after extubation.  Probably leave unit today.    LOS: 1 day   Additional comments:I reviewed the patient's new clinical lab test results. cbc/bmet and I reviewed the patients new imaging test results. CXR  Critical Care Total Time*: 30 Minutes  Lineth Thielke O 11/27/2012  *Care during the described time interval was provided by me and/or other providers on the critical care team.  I have reviewed this patient's available data, including medical history, events of note, physical examination and test results as part of my evaluation.

## 2012-11-27 NOTE — Procedures (Signed)
Extubation Procedure Note  Patient Details:   Name: Jeffrey [name] DOB: 01-Mar-1956 MRN: [id number]   Airway Documentation:     Evaluation  O2 sats: stable throughout Complications: No apparent complications Patient did tolerate procedure well. Bilateral Breath Sounds: Clear;Diminished Suctioning: Oral;Airway Yes  Ave Filter 11/27/2012, 8:59 AM

## 2012-11-28 ENCOUNTER — Encounter (HOSPITAL_COMMUNITY): Payer: Self-pay | Admitting: Orthopedic Surgery

## 2012-11-28 ENCOUNTER — Inpatient Hospital Stay (HOSPITAL_COMMUNITY): Payer: Medicaid Other

## 2012-11-28 DIAGNOSIS — M5136 Other intervertebral disc degeneration, lumbar region: Secondary | ICD-10-CM | POA: Insufficient documentation

## 2012-11-28 DIAGNOSIS — G8310 Monoplegia of lower limb affecting unspecified side: Secondary | ICD-10-CM

## 2012-11-28 DIAGNOSIS — D62 Acute posthemorrhagic anemia: Secondary | ICD-10-CM

## 2012-11-28 MED ORDER — OXYCODONE HCL 5 MG PO TABS
10.0000 mg | ORAL_TABLET | ORAL | Status: DC | PRN
  Administered 2012-11-28 – 2012-11-29 (×5): 10 mg via ORAL
  Administered 2012-11-29: 15 mg via ORAL
  Administered 2012-11-29: 5 mg via ORAL
  Administered 2012-11-29: 20 mg via ORAL
  Administered 2012-11-29: 15 mg via ORAL
  Administered 2012-11-30 – 2012-12-05 (×27): 20 mg via ORAL
  Filled 2012-11-28: qty 2
  Filled 2012-11-28 (×10): qty 4
  Filled 2012-11-28: qty 2
  Filled 2012-11-28: qty 4
  Filled 2012-11-28: qty 3
  Filled 2012-11-28: qty 2
  Filled 2012-11-28 (×3): qty 4
  Filled 2012-11-28: qty 2
  Filled 2012-11-28: qty 4
  Filled 2012-11-28: qty 1
  Filled 2012-11-28 (×4): qty 4
  Filled 2012-11-28: qty 2
  Filled 2012-11-28 (×7): qty 4
  Filled 2012-11-28: qty 1
  Filled 2012-11-28: qty 4
  Filled 2012-11-28: qty 3
  Filled 2012-11-28: qty 2
  Filled 2012-11-28: qty 3

## 2012-11-28 MED ORDER — DIAZEPAM 5 MG/ML IJ SOLN
INTRAMUSCULAR | Status: AC
  Filled 2012-11-28: qty 2

## 2012-11-28 MED ORDER — NAPROXEN 500 MG PO TABS
500.0000 mg | ORAL_TABLET | Freq: Two times a day (BID) | ORAL | Status: DC
  Administered 2012-11-28 – 2012-12-05 (×15): 500 mg via ORAL
  Filled 2012-11-28 (×18): qty 1

## 2012-11-28 MED ORDER — DIAZEPAM 5 MG/ML IJ SOLN
2.5000 mg | Freq: Once | INTRAMUSCULAR | Status: AC | PRN
  Administered 2012-11-28: 2.5 mg via INTRAVENOUS

## 2012-11-28 MED ORDER — FENTANYL CITRATE 0.05 MG/ML IJ SOLN
100.0000 ug | INTRAMUSCULAR | Status: DC | PRN
  Administered 2012-11-28 – 2012-11-29 (×5): 100 ug via INTRAVENOUS
  Filled 2012-11-28 (×5): qty 2

## 2012-11-28 MED ORDER — ENOXAPARIN SODIUM 30 MG/0.3ML ~~LOC~~ SOLN
30.0000 mg | Freq: Two times a day (BID) | SUBCUTANEOUS | Status: DC
  Administered 2012-11-28 – 2012-12-04 (×14): 30 mg via SUBCUTANEOUS
  Filled 2012-11-28 (×17): qty 0.3

## 2012-11-28 NOTE — Progress Notes (Signed)
Pt transferred to room 6N19, pt alert, oriented and pleasant, pt oriented to room, and call bell, pt denies pain, pt resting in bed with call bell in reach
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
## 2012-11-28 NOTE — Progress Notes (Signed)
OT Cancellation Note  Patient Details Name: Jeffrey [name] MRN: [id number] DOB: Jan 18, 1956   Cancelled Treatment:    Reason Eval/Treat Not Completed: Patient not medically ready. Awaiting results of MRI at RN's request.  Will continue to follow.  Evern Bio 11/28/2012, 1:49 PM [phone]

## 2012-11-28 NOTE — Progress Notes (Signed)
Wasted 150 ml iv fentanyl  Witnessed by Zeb Comfort RN
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
## 2012-11-28 NOTE — Progress Notes (Signed)
Patient seen and examined.  Agree with PA's note.  

## 2012-11-28 NOTE — Progress Notes (Signed)
PT Cancellation Note  Patient Details Name: Polo Mcmartin MRN: [id number] DOB: Dec 11, 1955   Cancelled Treatment:    Reason Eval/Treat Not Completed: Spoke with RN who requests we hold till tomorrow until results of MRI.   [facility] HELEN 11/28/2012, 12:47 PM Pager: [phone]

## 2012-11-28 NOTE — Progress Notes (Signed)
Pt transferred to 6N19 after MRI tolerated well

## 2012-11-29 LAB — CBC
HCT: 28.2 % — ABNORMAL LOW (ref 39.0–52.0)
MCH: 31.2 pg (ref 26.0–34.0)
MCHC: 35.5 g/dL (ref 30.0–36.0)
RDW: 13.5 % (ref 11.5–15.5)

## 2012-11-29 MED ORDER — TRAMADOL HCL 50 MG PO TABS
100.0000 mg | ORAL_TABLET | Freq: Four times a day (QID) | ORAL | Status: DC
  Administered 2012-11-29 – 2012-12-05 (×25): 100 mg via ORAL
  Filled 2012-11-29 (×32): qty 2
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
## 2012-11-29 NOTE — Progress Notes (Signed)
Hopefully this patient should be able to go home or somewhere soon.  This patient has been seen and I agree with the findings and treatment plan.  Marta Lamas. Gae Bon, MD, FACS [phone] (pager) [phone] (direct pager) Trauma Surgeon

## 2012-11-29 NOTE — Progress Notes (Signed)
Patient ID: Jeffrey [name], male   DOB: [birth year], 57 y.o.   MRN: [id number]   LOS: 3 days   Subjective: Still c/o RLE pain.   Objective: Vital signs in last 24 hours: Temp:  [97.4 F (36.3 C)-98 F (36.7 C)] 97.9 F (36.6 C) (03/26 0600) Pulse Rate:  [63-86] 72 (03/26 0600) Resp:  [13-18] 18 (03/26 0600) BP: (105-145)/(69-90) 139/90 mmHg (03/26 0600) SpO2:  [90 %-100 %] 100 % (03/26 0600) Last BM Date: 11/26/12   Laboratory  CBC  Recent Labs  11/27/12 0425 11/29/12 0558  WBC 6.1 5.0  HGB 9.7* 10.0*  HCT 28.2* 28.2*  PLT 151 130*    Physical Exam General appearance: alert and no distress Resp: clear to auscultation bilaterally Cardio: regular rate and rhythm GI: normal findings: bowel sounds normal and soft, non-tender   Assessment/Plan: SW back -- VAC in place ABL anemia -- Stable RLE paresis -- MR negative for acute process. Awaiting PT/OT evals. FEN -- Add sched tramadol as pt still needing IV breakthrough meds VTE -- SCD's, Lovenox  Dispo -- PT/OT    Freeman Caldron, PA-C Pager: [phone] General Trauma PA Pager: [phone]   11/29/2012
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
## 2012-11-30 LAB — TYPE AND SCREEN
Antibody Screen: NEGATIVE
Donor AG Type: NEGATIVE
Unit division: 0
Unit division: 0
Unit division: 0

## 2012-11-30 MED ORDER — FENTANYL CITRATE 0.05 MG/ML IJ SOLN
50.0000 ug | INTRAMUSCULAR | Status: DC | PRN
  Administered 2012-11-30 – 2012-12-05 (×24): 50 ug via INTRAVENOUS
  Filled 2012-11-30 (×24): qty 2
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
## 2012-11-30 NOTE — Progress Notes (Signed)
Patient ID: Jeffrey [name], male   DOB: [birth year], 57 y.o.   MRN: [id number]   LOS: 4 days   Subjective: No new c/o.   Objective: Vital signs in last 24 hours: Temp:  [97 F (36.1 C)-99.5 F (37.5 C)] 99.5 F (37.5 C) (03/27 0614) Pulse Rate:  [66-95] 95 (03/27 0614) Resp:  [18-20] 18 (03/27 0614) BP: (132-156)/(77-93) 156/86 mmHg (03/27 0614) SpO2:  [98 %-100 %] 98 % (03/27 0614) Last BM Date: 11/26/12   Laboratory  CBC  Recent Labs  11/29/12 0558  WBC 5.0  HGB 10.0*  HCT 28.2*  PLT 130*    Physical Exam General appearance: alert and no distress Resp: clear to auscultation bilaterally Cardio: regular rate and rhythm GI: normal findings: bowel sounds normal and soft, non-tender Back: VAC in place   Assessment/Plan: SW back -- VAC in place  ABL anemia -- Stable  RLE paresis -- MR negative for acute process. PT/OT  FEN -- Reduce dosage of breakthrough meds VTE -- SCD's, Lovenox  Dispo -- Awaiting SNF placement    Freeman Caldron, PA-C Pager: [phone] General Trauma PA Pager: [phone]   11/30/2012

## 2012-11-30 NOTE — Progress Notes (Signed)
UR completed 

## 2012-12-01 MED ORDER — OXYCODONE HCL 10 MG PO TABS: 10.0000 mg | ORAL_TABLET | ORAL | Status: DC | PRN

## 2012-12-01 MED ORDER — TRAMADOL HCL 50 MG PO TABS
100.0000 mg | ORAL_TABLET | Freq: Four times a day (QID) | ORAL | Status: DC | PRN
Start: 2012-12-01 — End: 2012-12-05

## 2012-12-01 NOTE — Progress Notes (Signed)
Physical Therapy Treatment Patient Details Name: Jeffrey [name] MRN: [id number] DOB: 08/13/1956 Today's Date: 12/01/2012 Time: 0800-0819 PT Time Calculation (min): 19 min  PT Assessment / Plan / Recommendation Comments on Treatment Session  Pt demonstrates improvements in functional mobility today. Will continue to progress activity as tolerated.    Follow Up Recommendations  SNF     Does the patient have the potential to tolerate intense rehabilitation     Barriers to Discharge        Equipment Recommendations       Recommendations for Other Services Other (comment) (may benefit from psych consult)  Frequency Min 2X/week   Plan      Precautions / Restrictions Precautions Precautions: Fall Precaution Comments: wound vac to back   Pertinent Vitals/Pain 6/10    Mobility  Bed Mobility Bed Mobility: Supine to Sit;Sitting - Scoot to Edge of Bed Supine to Sit: 5: Supervision Sitting - Scoot to [place] of Bed: 5: Supervision Details for Bed Mobility Assistance: VC's for hand placement Transfers Transfers: Sit to Stand;Stand to Sit;Stand Pivot Transfers Sit to Stand: 3: Mod assist;From bed Stand to Sit: 3: Mod assist Details for Transfer Assistance: Patient with imporvements in transfer ability today Ambulation/Gait Ambulation/Gait Assistance: 1: +2 Total assist Ambulation/Gait: Patient Percentage: 70% Ambulation Distance (Feet): 10 Feet Assistive device: Rolling walker Ambulation/Gait Assistance Details: Assit to advance rw and assist for stability. VCs for upright posture, and RLE placement Gait Pattern: Decreased stride length;Decreased weight shift to right;Lateral trunk lean to left;Trunk flexed;Narrow base of support Gait velocity: decreased General Gait Details: pt with increased pain but demonstrates better activity tolerance today    Exercises     PT Diagnosis:    PT Problem List:   PT Treatment Interventions:     PT Goals Acute Rehab PT Goals PT Goal  Formulation: With patient Potential to Achieve Goals: Good Pt will Transfer Bed to Chair/Chair to Bed: with modified independence PT Transfer Goal: Bed to Chair/Chair to Bed - Progress: Progressing toward goal Pt will Ambulate: 1 - 15 feet;with min assist PT Goal: Ambulate - Progress: Progressing toward goal Pt will Propel Wheelchair: > 150 feet;Independently  Visit Information  Last PT Received On: 12/01/12 Assistance Needed: +2    Subjective Data  Subjective: I feel rough Patient Stated Goal: none stated   Cognition  Cognition Overall Cognitive Status: Impaired Area of Impairment: Safety/judgement;Awareness of deficits Arousal/Alertness: Awake/alert Orientation Level: Appears intact for tasks assessed Behavior During Session: Anxious Safety/Judgement: Decreased safety judgement for tasks assessed Safety/Judgement - Other Comments: Pt unable to follow safety cues secondary to anxiousness and pain Awareness of Deficits: Pt feels that Jeffrey [name] is significantly impaired when prompted to perform tasks but is able to perform tasks when Jeffrey [name] doesnt realize Jeffrey [name] is performing them. Cognition - Other Comments: Pt with jovial behaviors alsomost childlike to some extent       End of Session PT - End of Session Equipment Utilized During Treatment: Gait belt Activity Tolerance: Patient limited by pain (anxiety) Patient left: in chair;with call bell/phone within reach Nurse Communication: Mobility status   GP     Fabio Asa 12/01/2012, 10:02 AM
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
## 2012-12-01 NOTE — Progress Notes (Signed)
Patient is ready to go to SNF if bed available.  This patient has been seen and I agree with the findings and treatment plan.  Marta Lamas. Gae Bon, MD, FACS [phone] (pager) [phone] (direct pager) Trauma Surgeon
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
## 2012-12-02 DIAGNOSIS — M79609 Pain in unspecified limb: Secondary | ICD-10-CM

## 2012-12-02 MED ORDER — BISACODYL 5 MG PO TBEC
5.0000 mg | DELAYED_RELEASE_TABLET | Freq: Every day | ORAL | Status: DC | PRN
  Administered 2012-12-02 – 2012-12-03 (×2): 5 mg via ORAL
  Filled 2012-12-02 (×2): qty 1

## 2012-12-02 NOTE — Progress Notes (Signed)
VASCULAR LAB PRELIMINARY  PRELIMINARY  PRELIMINARY  PRELIMINARY  Left lower extremity venous Doppler completed.    Preliminary report:  There is no DVT or SVT noted in the left lower extremity.  Evon Lopezperez, RVT 12/02/2012, 11:53 AM

## 2012-12-02 NOTE — Progress Notes (Signed)
I have seen and examined the patient and agree with the assessment and plans.  Vernis Cabacungan A. Maruice Pieroni  MD, FACS  

## 2012-12-02 NOTE — Progress Notes (Signed)
Patient ID: Jeffrey [name], male   DOB: Jun 05, 1956, 57 y.o.   MRN: [id number]   LOS: 6 days   Subjective: Still complains of significant left leg pain in the thigh with cramping.  Has not really improved at all, back very painful this am  Objective: Vital signs in last 24 hours: Temp:  [97.9 F (36.6 C)-98 F (36.7 C)] 98 F (36.7 C) (03/29 0553) Pulse Rate:  [76-80] 78 (03/29 0553) Resp:  [16-18] 16 (03/29 0553) BP: (109-157)/(69-96) 109/69 mmHg (03/29 0553) SpO2:  [98 %-100 %] 100 % (03/29 0553) Last BM Date: 11/26/12   Laboratory  CBC No results found for this basename: WBC, HGB, HCT, PLT,  in the last 72 hours  Physical Exam General: alert and no distress GI: normal findings: bowel sounds normal and soft, non-tender Back: VAC in place, only mild surrounding erythema Left Thigh: large amount of bruising, hematoma present in medial thigh, swelling present in thigh but not lower leg, very tender to touch, settled bruising present medial to posterior thigh   Assessment/Plan: SW back -- VAC in place, change MWF until superficial enough to start wet to dry ABL anemia -- Stable  LLE pain -- still significant pain, on exam there is signficant bruising and a hematoma present in the medial thigh, will order duplex of the left leg to r/o DVT or other causes for the patient's symptoms, may just be related to large hematoma/bruising  FEN -- Reduce dosage of breakthrough meds VTE -- SCD's, Lovenox  Dispo -- Awaiting SNF placement, can go whenever bed available.    Doristine Mango  General Trauma [place] Pager: [phone]   12/02/2012

## 2012-12-03 DIAGNOSIS — R339 Retention of urine, unspecified: Secondary | ICD-10-CM

## 2012-12-03 MED ORDER — TAMSULOSIN HCL 0.4 MG PO CAPS: 0.4000 mg | ORAL_CAPSULE | Freq: Every day | ORAL | Status: DC

## 2012-12-03 MED ORDER — TAMSULOSIN HCL 0.4 MG PO CAPS
0.4000 mg | ORAL_CAPSULE | Freq: Every day | ORAL | Status: DC
  Administered 2012-12-03 – 2012-12-04 (×2): 0.4 mg via ORAL
  Filled 2012-12-03 (×3): qty 1

## 2012-12-03 NOTE — Progress Notes (Signed)
Pt. Complained of difficulty starting and burning when urinating. Pt. Voided 200 cc. Checked for post void residual resulted in 164cc. Encouraged to drink lots of fluids. Will continue to monitor. KYoung RN

## 2012-12-03 NOTE — Progress Notes (Signed)
Patient seen and examined.  May need a short-term SNF.

## 2012-12-03 NOTE — Progress Notes (Signed)
Patient ID: Jeffrey [name], male   DOB: 11-Oct-1955, 57 y.o.   MRN: [id number]   LOS: 7 days   Subjective: Pt seems irritated this am, has concerns about leaving as he has no real place to go that can help him and he is non ambulatory right now, he also relates concerns about getting his wallet from the police, getting to his primary care MD and getting to the bank.  Still has same pain complaints, and also having some urinary retention  Objective: Vital signs in last 24 hours: Temp:  [97.5 F (36.4 C)-98.2 F (36.8 C)] 97.6 F (36.4 C) (03/30 0556) Pulse Rate:  [77-84] 77 (03/30 0556) Resp:  [18-21] 21 (03/30 0556) BP: (124-138)/(83-84) 138/84 mmHg (03/30 0556) SpO2:  [98 %-100 %] 98 % (03/30 0556) Last BM Date: 11/26/12   Laboratory  CBC No results found for this basename: WBC, HGB, HCT, PLT,  in the last 72 hours  Physical Exam General: alert and no distress GI: normal findings: bowel sounds normal and soft, non-tender Back: VAC in place, only mild surrounding erythema Left Thigh: large amount of bruising, hematoma present in medial thigh, swelling present in thigh but not lower leg, very tender to touch, settled bruising present medial to posterior thigh   Assessment/Plan: SW back -- VAC in place, change MWF until superficial enough to start wet to dry ABL anemia -- Stable  LLE pain -- leg pain unchanged, no DVT/SVT on duplex, likely due to the ecchymosis FEN -- Reduce dosage of breakthrough meds VTE -- SCD's, Lovenox  Dispo -- unsure if snf is possible per patient, he reports that they wont take him, will need to have LCSW to ask about this, ?pt reports something about an RL2 form that needs to be signed by his primary care MD that would allow him to be accepted to SNF, will also see if LCSW knows anything about this.    Doristine Mango  General Trauma [place] Pager: [phone]   12/03/2012

## 2012-12-04 LAB — CBC
HCT: 23.8 % — ABNORMAL LOW (ref 39.0–52.0)
Hemoglobin: 8.1 g/dL — ABNORMAL LOW (ref 13.0–17.0)
MCH: 29.7 pg (ref 26.0–34.0)
MCHC: 34 g/dL (ref 30.0–36.0)
MCV: 87.2 fL (ref 78.0–100.0)
Platelets: 204 10*3/uL (ref 150–400)
RBC: 2.73 MIL/uL — ABNORMAL LOW (ref 4.22–5.81)
RDW: 14.7 % (ref 11.5–15.5)
WBC: 4.8 10*3/uL (ref 4.0–10.5)

## 2012-12-04 MED ORDER — DIPHENHYDRAMINE HCL 50 MG/ML IJ SOLN
25.0000 mg | Freq: Four times a day (QID) | INTRAMUSCULAR | Status: DC | PRN
  Administered 2012-12-04 – 2012-12-05 (×3): 25 mg via INTRAVENOUS
  Filled 2012-12-04 (×3): qty 1

## 2012-12-04 NOTE — Progress Notes (Signed)
PT Cancellation Note  Patient Details Name: Jeffrey [name] MRN: [id number] DOB: 07-28-1956   Cancelled Treatment:    Reason Eval/Treat Not Completed: Pain limiting ability to participate.  On arrival, pt lying on the bed (which had no sheets) and calling out in pain. RN in to assess and went to obtain pain medicine. Discussed role of PT and pt reported he got himself from the recliner to the bed without assistance (recliner is right beside bed). Reports he was "pissed" at being left up in the chair and did not call for assistance. "I almost fell, but I didn't."   Pt further reports that PTA he only walked "in emergencies, like going from the house to the car."   Will bring a wheelchair on next visit to assess independence with transfers and use of w/c.   Jeffrey [name] 12/04/2012, 1:44 PM Pager [phone]

## 2012-12-04 NOTE — Progress Notes (Signed)
Occupational Therapy Treatment Patient Details Name: Kristin Lamagna MRN: [id number] DOB: 28-Feb-1956 Today's Date: 12/04/2012 Time: 4098-1191 OT Time Calculation (min): 23 min  OT Assessment / Plan / Recommendation Comments on Treatment Session Pt has improved since admission but still do not feel he is safe enough for d/c given his adl status and his wound status.    Follow Up Recommendations  SNF    Barriers to Discharge       Equipment Recommendations  None recommended by OT    Recommendations for Other Services    Frequency Min 2X/week   Plan Discharge plan remains appropriate    Precautions / Restrictions Precautions Precautions: Fall Precaution Comments: wound vac to back Restrictions Weight Bearing Restrictions: No   Pertinent Vitals/Pain Pt with pain of 8/10.    ADL  Eating/Feeding: Performed;Independent Where Assessed - Eating/Feeding: Chair Grooming: Wash/dry hands;Wash/dry face;Set up Where Assessed - Grooming: Supported sitting Upper Body Dressing: Performed;Set up Where Assessed - Upper Body Dressing: Unsupported sitting Lower Body Dressing: Performed;Moderate assistance Where Assessed - Lower Body Dressing: Unsupported sitting;Supported sit to stand Toilet Transfer: Moderate assistance;Performed Toilet Transfer Method: Surveyor, minerals: Materials engineer and Hygiene: Simulated;Moderate assistance Where Assessed - Toileting Clothing Manipulation and Hygiene: Standing Equipment Used: Rolling walker Transfers/Ambulation Related to ADLs: Pt lethargic during therapy.  Would fall asleep in between activities even and then report pain as a 8/10.  Pt transferred with less assist today but remains very unsafe and impulsive. ADL Comments: Pt with increased difficulty with adls now that he cannot stand for any length of time, but he has inproved since admission.  Pt can sit in chair, pull legs up  to access socks  and shoes.    OT Diagnosis:    OT Problem List:   OT Treatment Interventions:     OT Goals Acute Rehab OT Goals OT Goal Formulation: With patient Time For Goal Achievement: 12/13/12 Potential to Achieve Goals: Good ADL Goals Pt Will Perform Lower Body Bathing: with modified independence;Sitting, chair;Sit to stand from chair Pt Will Perform Lower Body Dressing: with modified independence;Sit to stand from chair ADL Goal: Lower Body Dressing - Progress: Progressing toward goals Pt Will Transfer to Toilet: with modified independence;Comfort height toilet;Grab bars;Stand pivot transfer ADL Goal: Toilet Transfer - Progress: Progressing toward goals Pt Will Perform Toileting - Clothing Manipulation: with modified independence;Standing ADL Goal: Toileting - Clothing Manipulation - Progress: Progressing toward goals  Visit Information  Last OT Received On: 12/04/12 Assistance Needed: +1    Subjective Data      Prior Functioning       Cognition  Cognition Overall Cognitive Status: Impaired Area of Impairment: Safety/judgement;Awareness of deficits Arousal/Alertness: Awake/alert Orientation Level: Appears intact for tasks assessed Behavior During Session: Anxious Safety/Judgement: Decreased safety judgement for tasks assessed Safety/Judgement - Other Comments: Pt unable to follow safety cues secondary to anxiousness and pain Awareness of Deficits: Pt feels that he is significantly impaired when prompted to perform tasks but is able to perform tasks when he doesnt realize he is performing them.    Mobility  Bed Mobility Bed Mobility: Supine to Sit;Sitting - Scoot to Edge of Bed Supine to Sit: 5: Supervision Sitting - Scoot to Edge of Bed: 5: Supervision Details for Bed Mobility Assistance: VC's for hand placement Transfers Transfers: Sit to Stand;Stand to Sit Sit to Stand: 4: Min assist;From bed;With armrests Stand to Sit: 4: Min assist;To chair/3-in-1;With armrests Details  for Transfer Assistance: Pt with improvements in  transfers but remains unsafe and impulsive.    Exercises      Balance     End of Session OT - End of Session Activity Tolerance: Patient limited by pain Patient left: in chair;with call bell/phone within reach Nurse Communication: Mobility status  GO     Hope Budds 12/04/2012, 1:09 PM [phone]

## 2012-12-04 NOTE — Clinical Social Work Note (Signed)
Clinical Social Worker continuing to follow patient for support and assist with discharge planning needs.  Due to patient previous history with previous facilities patient is not eligible for SNF placement at this time.  Patient has made arrangements to stay with a friend, Murray Hodgkins, once medically ready for discharge.  CM is involved for HH and wound vac needs.  CSW to provide clothing and bus pass to patient upon discharge.  CSW remains available for support as needed.  Macario Golds, [place] [phone]
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
## 2012-12-04 NOTE — Progress Notes (Signed)
Silver impregnated mepilex border to old scar, VAC changed and wound evaluated earlier by team - improving. Patient examined and I agree with the assessment and plan  Violeta Gelinas, MD, MPH, FACS Pager: [phone]  12/04/2012 3:54 PM
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
## 2012-12-05 DIAGNOSIS — G8929 Other chronic pain: Secondary | ICD-10-CM | POA: Diagnosis present

## 2012-12-05 MED ORDER — TRAMADOL HCL 50 MG PO TABS: 100.0000 mg | ORAL_TABLET | Freq: Four times a day (QID) | ORAL | Status: DC

## 2012-12-05 MED ORDER — OXYCODONE-ACETAMINOPHEN 10-325 MG PO TABS: 1.0000 | ORAL_TABLET | ORAL | Status: DC | PRN

## 2012-12-05 MED ORDER — NAPROXEN 500 MG PO TABS: 500.0000 mg | ORAL_TABLET | Freq: Two times a day (BID) | ORAL | Status: DC
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
## 2012-12-05 NOTE — Progress Notes (Signed)
D/C.  F/U also arranged with primary MD regarding chronic leg pain. Patient examined and I agree with the assessment and plan  Violeta Gelinas, MD, MPH, FACS Pager: [phone]  12/05/2012 11:25 AM

## 2012-12-05 NOTE — Discharge Summary (Signed)
Jeffrey Pollina, MD, MPH, FACS Pager: [phone]  

## 2012-12-05 NOTE — Progress Notes (Signed)
[facility] to [facility] by cab that pt called and said he would pay for. Wd vac removed and Hydrojel dsg placed with gauze. Pt verbally understood DC instructions with no questions ask. Pt was anxious and talking bad to staff upon being DC. Pt wheeled to short stay in new wc by staff awaiting taxi.

## 2012-12-05 NOTE — Clinical Social Work Note (Signed)
Clinical Social Worker provided patient with clothes and bus passes that he chose not to use.  Patient discharged in paper scrubs with his new wheelchair, refusing wound vac at discharge.  Patient chose to call his own cab and wait for them at short stay.  Patient unhappy at discharge.  Clinical Social Worker will sign off for now as social work intervention is no longer needed. Please consult [place] again if new need arises.  Macario Golds, [place] [phone]
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
## 2012-12-06 ENCOUNTER — Telehealth (INDEPENDENT_AMBULATORY_CARE_PROVIDER_SITE_OTHER): Payer: Self-pay

## 2012-12-06 NOTE — Telephone Encounter (Signed)
Lyla Son, RN @ [facility] called to report patient not answering any calls or returning calls to set up appointment for home health.  Lyla Son has made several attempts to make contact with patient and has been unsuccessful.

## 2012-12-07 ENCOUNTER — Telehealth (INDEPENDENT_AMBULATORY_CARE_PROVIDER_SITE_OTHER): Payer: Self-pay | Admitting: *Deleted

## 2012-12-07 ENCOUNTER — Encounter (HOSPITAL_COMMUNITY): Payer: Self-pay | Admitting: Emergency Medicine

## 2012-12-07 NOTE — Telephone Encounter (Signed)
Jeffrey [name] with [facility] called again to state that they have made multiple attempts to contact patient with no response. This RN does not see where one of our MDs has seen patient but Jeffrey [name] states she has order signed by Jeffrey [name] and Jeffrey [name].  Jeffrey [name] states that they closing the case on this patient since they are unable to get in touch with the patient.

## 2012-12-11 ENCOUNTER — Encounter (HOSPITAL_COMMUNITY): Payer: Self-pay | Admitting: Emergency Medicine

## 2012-12-11 ENCOUNTER — Emergency Department (HOSPITAL_COMMUNITY)
Admission: EM | Admit: 2012-12-11 | Discharge: 2012-12-11 | Disposition: A | Discharge status: Home / Self Care | Payer: Medicaid Other | Attending: Dermatology | Admitting: Dermatology

## 2012-12-11 DIAGNOSIS — F329 Major depressive disorder, single episode, unspecified: Secondary | ICD-10-CM | POA: Insufficient documentation

## 2012-12-11 DIAGNOSIS — F1410 Cocaine abuse, uncomplicated: Secondary | ICD-10-CM | POA: Insufficient documentation

## 2012-12-11 DIAGNOSIS — F10929 Alcohol use, unspecified with intoxication, unspecified: Secondary | ICD-10-CM

## 2012-12-11 DIAGNOSIS — X31XXXA Exposure to excessive natural cold, initial encounter: Secondary | ICD-10-CM | POA: Insufficient documentation

## 2012-12-11 DIAGNOSIS — G20 Parkinson's disease: Secondary | ICD-10-CM | POA: Insufficient documentation

## 2012-12-11 DIAGNOSIS — Y9389 Activity, other specified: Secondary | ICD-10-CM | POA: Insufficient documentation

## 2012-12-11 DIAGNOSIS — I10 Essential (primary) hypertension: Secondary | ICD-10-CM | POA: Insufficient documentation

## 2012-12-11 DIAGNOSIS — Z87891 Personal history of nicotine dependence: Secondary | ICD-10-CM | POA: Insufficient documentation

## 2012-12-11 DIAGNOSIS — T68XXXA Hypothermia, initial encounter: Secondary | ICD-10-CM | POA: Insufficient documentation

## 2012-12-11 DIAGNOSIS — G8929 Other chronic pain: Secondary | ICD-10-CM | POA: Insufficient documentation

## 2012-12-11 DIAGNOSIS — F1010 Alcohol abuse, uncomplicated: Secondary | ICD-10-CM | POA: Insufficient documentation

## 2012-12-11 DIAGNOSIS — R4789 Other speech disturbances: Secondary | ICD-10-CM | POA: Insufficient documentation

## 2012-12-11 DIAGNOSIS — Z5900 Homelessness unspecified: Secondary | ICD-10-CM | POA: Insufficient documentation

## 2012-12-11 DIAGNOSIS — R6889 Other general symptoms and signs: Secondary | ICD-10-CM

## 2012-12-11 DIAGNOSIS — Z79899 Other long term (current) drug therapy: Secondary | ICD-10-CM | POA: Insufficient documentation

## 2012-12-11 DIAGNOSIS — Y9289 Other specified places as the place of occurrence of the external cause: Secondary | ICD-10-CM | POA: Insufficient documentation

## 2012-12-11 DIAGNOSIS — Z8739 Personal history of other diseases of the musculoskeletal system and connective tissue: Secondary | ICD-10-CM | POA: Insufficient documentation

## 2012-12-11 DIAGNOSIS — IMO0002 Reserved for concepts with insufficient information to code with codable children: Secondary | ICD-10-CM | POA: Insufficient documentation

## 2012-12-11 DIAGNOSIS — Z8619 Personal history of other infectious and parasitic diseases: Secondary | ICD-10-CM | POA: Insufficient documentation

## 2012-12-11 DIAGNOSIS — F3289 Other specified depressive episodes: Secondary | ICD-10-CM | POA: Insufficient documentation

## 2012-12-11 DIAGNOSIS — G20A1 Parkinson's disease without dyskinesia, without mention of fluctuations: Secondary | ICD-10-CM | POA: Insufficient documentation

## 2012-12-11 LAB — CBC WITH DIFFERENTIAL/PLATELET
Eosinophils Absolute: 0.3 10*3/uL (ref 0.0–0.7)
Hemoglobin: 11.1 g/dL — ABNORMAL LOW (ref 13.0–17.0)
Lymphs Abs: 2.4 10*3/uL (ref 0.7–4.0)
MCH: 30.9 pg (ref 26.0–34.0)
Monocytes Relative: 7 % (ref 3–12)
Neutro Abs: 5.8 10*3/uL (ref 1.7–7.7)
Neutrophils Relative %: 63 % (ref 43–77)
RBC: 3.59 MIL/uL — ABNORMAL LOW (ref 4.22–5.81)

## 2012-12-11 LAB — RAPID URINE DRUG SCREEN, HOSP PERFORMED
Opiates: NOT DETECTED
Tetrahydrocannabinol: NOT DETECTED

## 2012-12-11 LAB — BASIC METABOLIC PANEL
BUN: 16 mg/dL (ref 6–23)
Chloride: 101 mEq/L (ref 96–112)
Glucose, Bld: 81 mg/dL (ref 70–99)
Potassium: 3.4 mEq/L — ABNORMAL LOW (ref 3.5–5.1)

## 2012-12-11 MED ORDER — SODIUM CHLORIDE 0.9 % IV BOLUS (SEPSIS)
1000.0000 mL | Freq: Once | INTRAVENOUS | Status: AC
  Administered 2012-12-11: 1000 mL via INTRAVENOUS

## 2012-12-11 NOTE — ED Notes (Signed)
Bear hugger for low temp
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
## 2012-12-11 NOTE — ED Notes (Signed)
[id number]<AV> Expected date:<BR> Expected time:<BR> Means of arrival:<BR> Comments:<BR> ETOH
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
## 2012-12-13 NOTE — ED Provider Notes (Signed)
Medical screening examination/treatment/procedure(s) were conducted as a shared visit with non-physician practitioner(s) and myself.  I personally evaluated the patient during the encounter Pt with hx etoh abuse. Declines wanting inpt rehab or detox. Normal mood/affect. Labs. Will get sw consult re shelter/home.   Suzi Roots, MD 12/13/12 2147

## 2012-12-14 ENCOUNTER — Emergency Department (HOSPITAL_COMMUNITY): Payer: Medicaid Other

## 2012-12-14 ENCOUNTER — Emergency Department (HOSPITAL_COMMUNITY)
Admission: EM | Admit: 2012-12-14 | Discharge: 2012-12-14 | Disposition: A | Discharge status: Home / Self Care | Payer: Medicaid Other | Attending: Emergency Medicine | Admitting: Emergency Medicine

## 2012-12-14 ENCOUNTER — Encounter (HOSPITAL_COMMUNITY): Payer: Self-pay | Admitting: Emergency Medicine

## 2012-12-14 DIAGNOSIS — Z9181 History of falling: Secondary | ICD-10-CM | POA: Insufficient documentation

## 2012-12-14 DIAGNOSIS — Z5900 Homelessness unspecified: Secondary | ICD-10-CM | POA: Insufficient documentation

## 2012-12-14 DIAGNOSIS — Y929 Unspecified place or not applicable: Secondary | ICD-10-CM | POA: Insufficient documentation

## 2012-12-14 DIAGNOSIS — W19XXXA Unspecified fall, initial encounter: Secondary | ICD-10-CM

## 2012-12-14 DIAGNOSIS — G8929 Other chronic pain: Secondary | ICD-10-CM | POA: Insufficient documentation

## 2012-12-14 DIAGNOSIS — Z79899 Other long term (current) drug therapy: Secondary | ICD-10-CM | POA: Insufficient documentation

## 2012-12-14 DIAGNOSIS — G20A1 Parkinson's disease without dyskinesia, without mention of fluctuations: Secondary | ICD-10-CM | POA: Insufficient documentation

## 2012-12-14 DIAGNOSIS — M6281 Muscle weakness (generalized): Secondary | ICD-10-CM | POA: Insufficient documentation

## 2012-12-14 DIAGNOSIS — Z8739 Personal history of other diseases of the musculoskeletal system and connective tissue: Secondary | ICD-10-CM | POA: Insufficient documentation

## 2012-12-14 DIAGNOSIS — Z87828 Personal history of other (healed) physical injury and trauma: Secondary | ICD-10-CM | POA: Insufficient documentation

## 2012-12-14 DIAGNOSIS — Z87891 Personal history of nicotine dependence: Secondary | ICD-10-CM | POA: Insufficient documentation

## 2012-12-14 DIAGNOSIS — S0990XA Unspecified injury of head, initial encounter: Secondary | ICD-10-CM | POA: Insufficient documentation

## 2012-12-14 DIAGNOSIS — M51379 Other intervertebral disc degeneration, lumbosacral region without mention of lumbar back pain or lower extremity pain: Secondary | ICD-10-CM | POA: Insufficient documentation

## 2012-12-14 DIAGNOSIS — IMO0002 Reserved for concepts with insufficient information to code with codable children: Secondary | ICD-10-CM | POA: Insufficient documentation

## 2012-12-14 DIAGNOSIS — W050XXA Fall from non-moving wheelchair, initial encounter: Secondary | ICD-10-CM | POA: Insufficient documentation

## 2012-12-14 DIAGNOSIS — I10 Essential (primary) hypertension: Secondary | ICD-10-CM | POA: Insufficient documentation

## 2012-12-14 DIAGNOSIS — F3289 Other specified depressive episodes: Secondary | ICD-10-CM | POA: Insufficient documentation

## 2012-12-14 DIAGNOSIS — M5137 Other intervertebral disc degeneration, lumbosacral region: Secondary | ICD-10-CM | POA: Insufficient documentation

## 2012-12-14 DIAGNOSIS — F10229 Alcohol dependence with intoxication, unspecified: Secondary | ICD-10-CM | POA: Insufficient documentation

## 2012-12-14 DIAGNOSIS — Y939 Activity, unspecified: Secondary | ICD-10-CM | POA: Insufficient documentation

## 2012-12-14 DIAGNOSIS — G20 Parkinson's disease: Secondary | ICD-10-CM | POA: Insufficient documentation

## 2012-12-14 DIAGNOSIS — F329 Major depressive disorder, single episode, unspecified: Secondary | ICD-10-CM | POA: Insufficient documentation

## 2012-12-14 DIAGNOSIS — Z8619 Personal history of other infectious and parasitic diseases: Secondary | ICD-10-CM | POA: Insufficient documentation

## 2012-12-14 DIAGNOSIS — W1809XA Striking against other object with subsequent fall, initial encounter: Secondary | ICD-10-CM | POA: Insufficient documentation

## 2012-12-14 LAB — CBC WITH DIFFERENTIAL/PLATELET
Basophils Absolute: 0.1 10*3/uL (ref 0.0–0.1)
Eosinophils Relative: 2 % (ref 0–5)
HCT: 31.3 % — ABNORMAL LOW (ref 39.0–52.0)
Hemoglobin: 10.7 g/dL — ABNORMAL LOW (ref 13.0–17.0)
MCH: 31.8 pg (ref 26.0–34.0)
MCHC: 34.2 g/dL (ref 30.0–36.0)
Monocytes Relative: 10 % (ref 3–12)

## 2012-12-14 LAB — RAPID URINE DRUG SCREEN, HOSP PERFORMED
Barbiturates: NOT DETECTED
Benzodiazepines: NOT DETECTED
Cocaine: NOT DETECTED
Tetrahydrocannabinol: POSITIVE — AB

## 2012-12-14 LAB — COMPREHENSIVE METABOLIC PANEL
ALT: 16 U/L (ref 0–53)
AST: 34 U/L (ref 0–37)
Alkaline Phosphatase: 104 U/L (ref 39–117)
CO2: 24 mEq/L (ref 19–32)
Calcium: 8.1 mg/dL — ABNORMAL LOW (ref 8.4–10.5)
Chloride: 105 mEq/L (ref 96–112)
GFR calc Af Amer: >90 mL/min (ref >90)
GFR calc non Af Amer: >90 mL/min (ref >90)
Glucose, Bld: 100 mg/dL — ABNORMAL HIGH (ref 70–99)
Potassium: 3.3 mEq/L — ABNORMAL LOW (ref 3.5–5.1)
Sodium: 142 mEq/L (ref 135–145)

## 2012-12-14 MED ORDER — POTASSIUM CHLORIDE CRYS ER 20 MEQ PO TBCR
20.0000 meq | EXTENDED_RELEASE_TABLET | Freq: Once | ORAL | Status: AC
  Administered 2012-12-14: 20 meq via ORAL
  Filled 2012-12-14: qty 1

## 2012-12-14 MED ORDER — HYDROMORPHONE HCL PF 1 MG/ML IJ SOLN
1.0000 mg | Freq: Once | INTRAMUSCULAR | Status: AC
  Administered 2012-12-14: 1 mg via INTRAMUSCULAR
  Filled 2012-12-14: qty 1

## 2012-12-14 NOTE — ED Notes (Signed)
Patient given wet washcloth per request to wipe off.

## 2012-12-14 NOTE — ED Notes (Signed)
Called Child psychotherapist and left message to callback.

## 2012-12-14 NOTE — Progress Notes (Signed)
CSW met with the Pt concerning d/c planning and Pt stated that he was suppose to go stay at the [facility] recovery and was not able to call them yesterday. CSW contacted [facility] Roe Coombs) and is awaiting a call back for d/c planning.    CSW attempted to contact Roe Coombs at [facility] and he is currently assessing a client. Pt stated he was promised a bed at that facility and CSW will see if a bed is available.   CSW will continue to contact facility for d/c planning.  Leron Croak, LCSWA [facility].  [phone]

## 2012-12-14 NOTE — ED Notes (Signed)
Changed dressing for back wound.  Dr. Clarene Duke reevaluated wound while undressed.   Okay to dress back.

## 2012-12-14 NOTE — ED Notes (Signed)
CASE MANAGER HAS FOUND A PLACE FOR PT TO STAY. SHE IS NOW TRYING TO ARRANGE MEDICAID TRANPORTATION FOR PT

## 2012-12-14 NOTE — ED Notes (Signed)
PATIENT DISCHARGE IS DELAYED BY SOCIAL WORKER. STILL TRYING TO FIND PLACE FOR PT TO GO
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
## 2012-12-14 NOTE — ED Notes (Addendum)
Called to check on breakfast tray.  Holding K+ until tray comes per report.  It should be here in 15 minutes.  Breakfast tray has been ordered over an hour.

## 2012-12-14 NOTE — ED Notes (Signed)
Patient received breakfast. 

## 2012-12-14 NOTE — Progress Notes (Addendum)
CSW received call from nurse and will be assessing Pt for homeless concerns and any additional needs.   Leron Croak, LCSWA [facility].  [phone]

## 2012-12-14 NOTE — ED Notes (Signed)
Patient cleared for d/c but due to no place of residence, patient will be held until am when social work will be consulted. Patient in NAD at this time

## 2012-12-14 NOTE — ED Notes (Addendum)
Social work at bedside to speak with patient.  Per social work, she will get resources for patient.   Patient is unable to walk, patient was advised to reapply for disability (per Child psychotherapist).

## 2012-12-14 NOTE — ED Provider Notes (Addendum)
History     CSN: [id number]  Arrival date & time 12/14/12  0123   First MD Initiated Contact with Patient 12/14/12 0149      Chief Complaint  Patient presents with  . Fall    (Consider location/radiation/quality/duration/timing/severity/associated sxs/prior treatment) The history is provided by the patient. The history is limited by the condition of the patient.  Jeffrey [name] is a 57 y.o. 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 He is wheelchair-bound and has been followed very frequently. Today he drank a lot of mouth wash and fell out of his wheelchair onto the floor. He hit the front of his head but did not lose consciousness. He has in seen in the ER frequently for falls and back pain. Denies any worsening weakness or numbness.   Level V caveat- alcohol intoxication   Past Medical History  Diagnosis Date  . DDD (degenerative disc disease)   . Necrotizing fasciitis   . Degenerative disc disease   . Hypertension   . Degenerative disc disease   . Hepatitis C   . Chronic pain   . DDD (degenerative disc disease)   . Parkinson disease   . Mental disorder   . Depression   . DDD (degenerative disc disease)   . DDD (degenerative disc disease), lumbar     Past Surgical History  Procedure Laterality Date  . Hip surgery    . Appendectomy    . Ankle surgery    . Incision and drainage of wound N/A 11/26/2012    Procedure: IRRIGATION AND DEBRIDEMENT WOUND;  Surgeon: Emelia Loron, MD;  Location: [facility] OR;  Service: General;  Laterality: N/A;  . Wound exploration N/A 11/26/2012    Procedure: WOUND EXPLORATION;  Surgeon: Emelia Loron, MD;  Location: [facility] OR;  Service: General;  Laterality: N/A;    No family history on file.  History  Substance Use Topics  . Smoking status: Former Smoker -- 1.00 packs/day    Types: Cigarettes    Quit date: 11/27/2012  . Smokeless tobacco: Current User    Types: Chew   . Alcohol Use: 1.5 oz/week     Comment: anything he can get his hands on      Review of Systems  Musculoskeletal: Positive for back pain.  All other systems reviewed and are negative.    Allergies  Review of patient's allergies indicates no known allergies.  Home Medications   Current Outpatient Rx  Name  Route  Sig  Dispense  Refill  . gabapentin (NEURONTIN) 600 MG tablet   Oral   Take 600 mg by mouth 3 (three) times daily.         . metoprolol succinate (TOPROL-XL) 25 MG 24 hr tablet   Oral   Take 1 tablet (25 mg total) by mouth daily.   30 tablet   6   . oxyCODONE-acetaminophen (PERCOCET) 10-325 MG per tablet   Oral   Take 1-2 tablets by mouth every 4 (four) hours as needed for pain.   60 tablet   0   . traMADol (ULTRAM) 50 MG tablet   Oral   Take 2 tablets (100 mg total) by mouth 4 (four) times daily.           BP 136/68  Pulse 81  Temp(Src) 98.1 F (36.7 C) (Oral)  Resp 24  SpO2 96%  Physical Exam  Nursing note and vitals reviewed. Constitutional:  Smells of alcohol.   HENT:  Head:  Abrasions noted, no lacerations   Eyes: Conjunctivae are normal. Pupils are equal, round, and reactive to light.  Neck: Normal range of motion. Neck supple.  Cardiovascular: Normal rate, regular rhythm and normal heart sounds.   Pulmonary/Chest: Effort normal and breath sounds normal. No respiratory distress. He has no wheezes. He has no rales.  Abdominal: Soft. Bowel sounds are normal. He exhibits no distension. There is no tenderness. There is no rebound and no guarding.  Musculoskeletal: Normal range of motion.  Weakness bilateral legs (chronic, wheelchair bound)  Neurological: He is alert.  Intoxicated   Skin:     Abrasions from previous falls   Psychiatric:  Unable     ED Course  Procedures (including critical care time)  Labs Reviewed  CBC WITH DIFFERENTIAL - Abnormal; Notable for the following:    RBC 3.37 (*)    Hemoglobin 10.7 (*)     HCT 31.3 (*)    RDW 19.5 (*)    All other components within normal limits  COMPREHENSIVE METABOLIC PANEL - Abnormal; Notable for the following:    Potassium 3.3 (*)    Glucose, Bld 100 (*)    Calcium 8.1 (*)    Albumin 3.0 (*)    All other components within normal limits  ETHANOL - Abnormal; Notable for the following:    Alcohol, Ethyl (B) 168 (*)    All other components within normal limits  URINE RAPID DRUG SCREEN (HOSP PERFORMED) - Abnormal; Notable for the following:    Opiates POSITIVE (*)    Tetrahydrocannabinol POSITIVE (*)    All other components within normal limits   Ct Head Wo Contrast  12/14/2012  *RADIOLOGY REPORT*  Clinical Data: Status post fall out of wheelchair.  Concern for head injury.  CT HEAD WITHOUT CONTRAST  Technique:  Contiguous axial images were obtained from the base of the skull through the vertex without contrast.  Comparison: CT of the head performed 05/18/2010  Findings: There is no evidence of acute infarction, mass lesion, or intra- or extra-axial hemorrhage on CT.  The posterior fossa, including the cerebellum, brainstem and fourth ventricle, is within normal limits.  The third and lateral ventricles, and basal ganglia are unremarkable in appearance.  The cerebral hemispheres are symmetric in appearance, with normal gray- white differentiation.  No mass effect or midline shift is seen.  There is no evidence of fracture; visualized osseous structures are unremarkable in appearance.  The orbits are within normal limits. The paranasal sinuses and mastoid air cells are well-aerated.  Soft tissue swelling is noted overlying the frontal calvarium.  IMPRESSION:  1.  No evidence of traumatic intracranial injury or fracture. 2.  Soft tissue swelling noted overlying the frontal calvarium.   Original Report Authenticated By: Tonia Ghent, M.D.      No diagnosis found.    MDM  Jeffrey [name] is a 57 y.o. male here with s/p fall. Has frequent falls. Not on blood  thinners. Will get CT head to r/o bleed given alcohol intoxication and falls. Will check alcohol level and d/c when sober.   5:36 AM ETOH 168. Clinically sober. CT head showed no bleed. Patient is homeless. Will have social work called in AM to arrange for place to go.          Richardean Canal, MD 12/14/12 1610  Richardean Canal, MD 12/14/12 [phone]

## 2012-12-14 NOTE — Progress Notes (Signed)
CSW spoke with [facility] services in [place] and the admissions coordinator had not heard of the Pt, however stated that if the Pt would come on Monday morning for and assessment then he could be placed on the waiting list. There is currently a 2 week wait for placement at that facility.   CSW also contacted the [facility] for possible bed availability and they are able to accommodate the Pt if he arrives at the facility between 2pm-8pm. CSW made Pt and nurse aware of the d/c plan.   CSW contacted the [facility] to schedule pick up from ED to [facility]. Pt pickup time is 13:15.    Leron Croak, LCSWA [facility] Emergency Dept.  [phone]

## 2012-12-15 ENCOUNTER — Telehealth: Payer: Self-pay | Admitting: Family Medicine

## 2012-12-15 ENCOUNTER — Encounter (INDEPENDENT_AMBULATORY_CARE_PROVIDER_SITE_OTHER): Payer: Self-pay

## 2012-12-15 ENCOUNTER — Inpatient Hospital Stay: Payer: Medicaid Other | Admitting: Family Medicine

## 2012-12-15 NOTE — Telephone Encounter (Signed)
Form was in my box for a few days, not months.  I completed it and will fax today.  Thanks Desma Mcgregor.

## 2012-12-15 NOTE — Telephone Encounter (Signed)
Jeffrey [name] called to find out why it is taking so long to get the form completed for his wheelchair fixed.  He is now not ambulatory.  I let him know that the form is in Dr. Tye Savoy box and that I would let her know he is calling to check the status of it.  He went into many explatives about it being in her box for 2 months and he was going to take 2 months to come up to the office for his next appt.  He also just got his phone working so he didn't know had an appt this morning which it didn't bother him that he missed because she just makes him miserable.  And on and on and on.  During his ranting I let him know that I would let Dr. Tye Savoy know that he called to check on the status of the form.  After saying this 3 times, I ended the call.

## 2012-12-20 ENCOUNTER — Telehealth: Payer: Self-pay | Admitting: Family Medicine

## 2012-12-20 NOTE — Telephone Encounter (Signed)
She is currently trying to help patient find a nursing home or a place for him to stay - she is bringing him to his appt next week to meet doctor and to see what can be done to help him.  This is noted so that we would have a contact number for her.

## 2012-12-27 ENCOUNTER — Encounter: Payer: Self-pay | Admitting: Family Medicine

## 2012-12-27 ENCOUNTER — Ambulatory Visit (INDEPENDENT_AMBULATORY_CARE_PROVIDER_SITE_OTHER): Payer: Medicaid Other | Admitting: Family Medicine

## 2012-12-27 VITALS — BP 176/88 | HR 66 | Temp 98.9°F

## 2012-12-27 DIAGNOSIS — G894 Chronic pain syndrome: Secondary | ICD-10-CM

## 2012-12-27 DIAGNOSIS — M79609 Pain in unspecified limb: Secondary | ICD-10-CM

## 2012-12-27 DIAGNOSIS — I10 Essential (primary) hypertension: Secondary | ICD-10-CM

## 2012-12-27 DIAGNOSIS — Z111 Encounter for screening for respiratory tuberculosis: Secondary | ICD-10-CM

## 2012-12-27 DIAGNOSIS — G8929 Other chronic pain: Secondary | ICD-10-CM

## 2012-12-27 DIAGNOSIS — F341 Dysthymic disorder: Secondary | ICD-10-CM

## 2012-12-27 DIAGNOSIS — M79606 Pain in leg, unspecified: Secondary | ICD-10-CM

## 2012-12-27 MED ORDER — METOPROLOL SUCCINATE ER 25 MG PO TB24: 25.0000 mg | ORAL_TABLET | Freq: Every day | ORAL | Status: DC

## 2012-12-27 MED ORDER — TRAMADOL HCL 50 MG PO TABS: 100.0000 mg | ORAL_TABLET | Freq: Four times a day (QID) | ORAL | Status: DC | PRN

## 2012-12-27 MED ORDER — GABAPENTIN 600 MG PO TABS: 600.0000 mg | ORAL_TABLET | Freq: Three times a day (TID) | ORAL | Status: DC

## 2012-12-27 NOTE — Patient Instructions (Addendum)
Tramadol and Gabapentin were sent to pharmacy. Please contact general surgery to make an appointment. We will call you with time and date of Pain clinic referral. Follow up in one month.
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
## 2012-12-28 ENCOUNTER — Encounter: Payer: Self-pay | Admitting: Family Medicine

## 2012-12-28 MED ORDER — METOPROLOL SUCCINATE ER 25 MG PO TB24: 25.0000 mg | ORAL_TABLET | Freq: Every day | ORAL | Status: DC

## 2012-12-28 NOTE — Assessment & Plan Note (Signed)
Will refill Metoprolol 25 and recheck BP in one month.

## 2012-12-28 NOTE — Assessment & Plan Note (Signed)
Refill Tramadol and Gabapentin for chronic pain.  Will attempt to refer to pain clinic again, however previous referrals to pain clinic fell through due to numerous reasons - no transportation, no shows, etc.  Hopefully, now that he is at a shelter, he will be able to go to appointments.

## 2012-12-28 NOTE — Assessment & Plan Note (Signed)
Patient has significant behavioral issues which is keeping him from living at a SNF.  Will route [name] and see if he can move from shelter to SNF again.  He should be able to qualify because he has 2-3 skilled nursing needs.

## 2013-01-03 ENCOUNTER — Telehealth: Payer: Self-pay | Admitting: *Deleted

## 2013-01-03 NOTE — Telephone Encounter (Signed)
Last week Dr. Tye Savoy placed a referral to Pain Center at [facility].  Today Jeffrey [name] called in saying the referral was suppose to go to [facility].  I called the phone number he gave [place] and [facility] is a Internal Medicine Clinic not  Pain Management. Huntley Dec has called and left him a message for clarification on this referral he is wanting.  Ileana Ladd

## 2013-01-03 NOTE — Telephone Encounter (Signed)
LVM for patient to call back. We are needing to get clarification on the pain referral. The number the patient gave [place] is for [facility] [phone]. Is he wanting to change his doctor's office or be referred to a pain clinic in [place]?

## 2013-01-03 NOTE — Telephone Encounter (Signed)
Mr. Manheim called back to ask that the referral to the [facility] be made for pain mgt

## 2013-01-04 NOTE — Telephone Encounter (Signed)
Faxed referral to [facility] per patient's request

## 2013-01-05 ENCOUNTER — Telehealth: Payer: Self-pay | Admitting: *Deleted

## 2013-01-05 NOTE — Telephone Encounter (Signed)
Practice called regarding referral for pt - will not accept due to having hx of multiple providers and multiple narcotic prescriptions.

## 2013-01-10 ENCOUNTER — Encounter (HOSPITAL_COMMUNITY): Payer: Self-pay | Admitting: Emergency Medicine

## 2013-01-10 ENCOUNTER — Telehealth: Payer: Self-pay | Admitting: Family Medicine

## 2013-01-10 ENCOUNTER — Ambulatory Visit (INDEPENDENT_AMBULATORY_CARE_PROVIDER_SITE_OTHER): Payer: Medicaid Other | Admitting: *Deleted

## 2013-01-10 ENCOUNTER — Emergency Department (HOSPITAL_COMMUNITY)
Admission: EM | Admit: 2013-01-10 | Discharge: 2013-01-10 | Disposition: A | Discharge status: Home / Self Care | Payer: Medicaid Other | Attending: Emergency Medicine | Admitting: Emergency Medicine

## 2013-01-10 ENCOUNTER — Emergency Department (HOSPITAL_COMMUNITY): Payer: Medicaid Other

## 2013-01-10 DIAGNOSIS — Z8619 Personal history of other infectious and parasitic diseases: Secondary | ICD-10-CM | POA: Insufficient documentation

## 2013-01-10 DIAGNOSIS — M25559 Pain in unspecified hip: Secondary | ICD-10-CM | POA: Insufficient documentation

## 2013-01-10 DIAGNOSIS — I10 Essential (primary) hypertension: Secondary | ICD-10-CM | POA: Insufficient documentation

## 2013-01-10 DIAGNOSIS — M5137 Other intervertebral disc degeneration, lumbosacral region: Secondary | ICD-10-CM | POA: Insufficient documentation

## 2013-01-10 DIAGNOSIS — G20A1 Parkinson's disease without dyskinesia, without mention of fluctuations: Secondary | ICD-10-CM | POA: Insufficient documentation

## 2013-01-10 DIAGNOSIS — M545 Low back pain, unspecified: Secondary | ICD-10-CM | POA: Insufficient documentation

## 2013-01-10 DIAGNOSIS — F17200 Nicotine dependence, unspecified, uncomplicated: Secondary | ICD-10-CM | POA: Insufficient documentation

## 2013-01-10 DIAGNOSIS — Z9889 Other specified postprocedural states: Secondary | ICD-10-CM | POA: Insufficient documentation

## 2013-01-10 DIAGNOSIS — Z993 Dependence on wheelchair: Secondary | ICD-10-CM | POA: Insufficient documentation

## 2013-01-10 DIAGNOSIS — Z111 Encounter for screening for respiratory tuberculosis: Secondary | ICD-10-CM

## 2013-01-10 DIAGNOSIS — F3289 Other specified depressive episodes: Secondary | ICD-10-CM | POA: Insufficient documentation

## 2013-01-10 DIAGNOSIS — Z79899 Other long term (current) drug therapy: Secondary | ICD-10-CM | POA: Insufficient documentation

## 2013-01-10 DIAGNOSIS — G20 Parkinson's disease: Secondary | ICD-10-CM | POA: Insufficient documentation

## 2013-01-10 DIAGNOSIS — F329 Major depressive disorder, single episode, unspecified: Secondary | ICD-10-CM | POA: Insufficient documentation

## 2013-01-10 DIAGNOSIS — G8929 Other chronic pain: Secondary | ICD-10-CM | POA: Insufficient documentation

## 2013-01-10 DIAGNOSIS — Z87828 Personal history of other (healed) physical injury and trauma: Secondary | ICD-10-CM | POA: Insufficient documentation

## 2013-01-10 DIAGNOSIS — M51379 Other intervertebral disc degeneration, lumbosacral region without mention of lumbar back pain or lower extremity pain: Secondary | ICD-10-CM | POA: Insufficient documentation

## 2013-01-10 MED ORDER — HYDROMORPHONE HCL PF 1 MG/ML IJ SOLN
1.0000 mg | Freq: Once | INTRAMUSCULAR | Status: AC
  Administered 2013-01-10: 1 mg via INTRAMUSCULAR
  Filled 2013-01-10: qty 1

## 2013-01-10 NOTE — ED Notes (Signed)
MD at bedside. 

## 2013-01-10 NOTE — ED Notes (Signed)
Reports chronic lower back pain.  Pt states pain is worse today and states he is unable to ambulate.  Pt states he is trying to get residence at [facility] because he is unable to care for himself.
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
## 2013-01-10 NOTE — Telephone Encounter (Signed)
Hi Jeffrey [name], I know this patient will not be accepted to SNF, but what about ALF?

## 2013-01-10 NOTE — Telephone Encounter (Signed)
[facility] is calling because they were trying to get in touch with Jeffrey [name] to get the [facility] for the patient to be admitted to Assisted Living.  Jimmy Footman requested that I send this straight to Dr. Tye Savoy.

## 2013-01-10 NOTE — ED Notes (Signed)
Pt provided phone number to contact [facility] for transportation home.Pt has appt with PCP for pain clinic and home placement. NAD. VSS.

## 2013-01-11 NOTE — Progress Notes (Signed)
Patient here today for repeat PPD.  Had PPD 2 weeks ago, but did not return to have site read.  States he needs PPD for nursing home placement.  PPD given today in right ventral forearm.  Patient states he also needs an FL-2 form to get into the nursing home.  No copy of FL-2 form on file.  Patient scheduled appt for 01/12/13 to have FL-2 form completed and to have PPD read.   Gaylene Brooks, RN

## 2013-01-11 NOTE — Telephone Encounter (Signed)
Morrie Sheldon called again and she is very frustrated about getting this man placed into another facility.  Needs to know status of FL2.  With [name] being out sick, this has been placed on hold but she is ready for someone to get him out of this facility and into another place. ASAP

## 2013-01-11 NOTE — Telephone Encounter (Signed)
Theresia Bough is out of office x 2 weeks.  Will inform Dr. Tye Savoy for other possible options.  Gaylene Brooks, RN

## 2013-01-12 ENCOUNTER — Ambulatory Visit (INDEPENDENT_AMBULATORY_CARE_PROVIDER_SITE_OTHER): Payer: Medicaid Other | Admitting: Family Medicine

## 2013-01-12 ENCOUNTER — Encounter: Payer: Self-pay | Admitting: Family Medicine

## 2013-01-12 VITALS — BP 160/99 | Temp 99.1°F

## 2013-01-12 DIAGNOSIS — Z5189 Encounter for other specified aftercare: Secondary | ICD-10-CM

## 2013-01-12 DIAGNOSIS — S21219D Laceration without foreign body of unspecified back wall of thorax without penetration into thoracic cavity, subsequent encounter: Secondary | ICD-10-CM

## 2013-01-12 DIAGNOSIS — M549 Dorsalgia, unspecified: Secondary | ICD-10-CM
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
## 2013-01-12 NOTE — Patient Instructions (Addendum)
[name], please give Morrie Sheldon the list of ALF that accept Medicaid. I will contact [facility] to help with placement. I will also complete the FL-2 form and fax it to [place]. We will contact you if we hear back from [facility]. Schedule follow up appointment as needed.

## 2013-01-14 NOTE — Assessment & Plan Note (Signed)
Healing well without signs of infection.  Wound care nurse sees patient once at week at [facility].

## 2013-01-14 NOTE — Assessment & Plan Note (Signed)
Patient has tried NSAIDs, Tramadol, and percocet without any relief of chronic back and hip pain.  I have tried referring to several pain clinics in the area and [facility], but he was not accepted.  Patient is aware that I will no longer prescribe narcotics due to his hx of suicidal attempts and overdose.  I hope he can be seen at [facility] Pain clinic and that he can be placed in an ALF.  Follow up as needed.

## 2013-01-18 ENCOUNTER — Encounter (HOSPITAL_COMMUNITY): Payer: Self-pay

## 2013-01-18 ENCOUNTER — Emergency Department (HOSPITAL_COMMUNITY)
Admission: EM | Admit: 2013-01-18 | Discharge: 2013-01-19 | Disposition: A | Discharge status: Home / Self Care | Payer: Medicaid Other | Attending: Emergency Medicine | Admitting: Emergency Medicine

## 2013-01-18 DIAGNOSIS — G20 Parkinson's disease: Secondary | ICD-10-CM | POA: Insufficient documentation

## 2013-01-18 DIAGNOSIS — M545 Low back pain, unspecified: Secondary | ICD-10-CM | POA: Insufficient documentation

## 2013-01-18 DIAGNOSIS — G8929 Other chronic pain: Secondary | ICD-10-CM | POA: Insufficient documentation

## 2013-01-18 DIAGNOSIS — Z8619 Personal history of other infectious and parasitic diseases: Secondary | ICD-10-CM | POA: Insufficient documentation

## 2013-01-18 DIAGNOSIS — Z8739 Personal history of other diseases of the musculoskeletal system and connective tissue: Secondary | ICD-10-CM | POA: Insufficient documentation

## 2013-01-18 DIAGNOSIS — I10 Essential (primary) hypertension: Secondary | ICD-10-CM | POA: Insufficient documentation

## 2013-01-18 DIAGNOSIS — F17200 Nicotine dependence, unspecified, uncomplicated: Secondary | ICD-10-CM | POA: Insufficient documentation

## 2013-01-18 DIAGNOSIS — Z5900 Homelessness unspecified: Secondary | ICD-10-CM | POA: Insufficient documentation

## 2013-01-18 DIAGNOSIS — Z79899 Other long term (current) drug therapy: Secondary | ICD-10-CM | POA: Insufficient documentation

## 2013-01-18 DIAGNOSIS — M549 Dorsalgia, unspecified: Secondary | ICD-10-CM

## 2013-01-18 DIAGNOSIS — G20A1 Parkinson's disease without dyskinesia, without mention of fluctuations: Secondary | ICD-10-CM | POA: Insufficient documentation

## 2013-01-18 DIAGNOSIS — Z87828 Personal history of other (healed) physical injury and trauma: Secondary | ICD-10-CM | POA: Insufficient documentation

## 2013-01-18 MED ORDER — OXYCODONE-ACETAMINOPHEN 5-325 MG PO TABS
1.0000 | ORAL_TABLET | Freq: Once | ORAL | Status: AC
  Administered 2013-01-18: 1 via ORAL
  Filled 2013-01-18: qty 1

## 2013-01-18 MED ORDER — OXYCODONE-ACETAMINOPHEN 5-325 MG PO TABS: 2.0000 | ORAL_TABLET | Freq: Four times a day (QID) | ORAL | Status: DC | PRN

## 2013-01-18 MED ORDER — NICOTINE 21 MG/24HR TD PT24
21.0000 mg | MEDICATED_PATCH | Freq: Once | TRANSDERMAL | Status: DC
  Administered 2013-01-18: 21 mg via TRANSDERMAL
  Filled 2013-01-18: qty 1

## 2013-01-18 NOTE — ED Notes (Signed)
Spoke with pt re: dcp.  Pt states that he is non-ambulatory and homeless and needs placement in a NH.  Pt maintains that he has no family/friends to provide him care and that he is not appropriate for homeless shelters because he is incontinent and incapable of self care.  Pt was recently referred to [facility] for substance abuse, but claims that he does not need treatment, that he was only looking for a place to stay.  Pt states that he was recently at [facility] for SA, but it was not handicapped accessible and they sent him to a homeless shelter, [facility].  Pt has been assessed by various CSWs recently and deemed not to be a candidate for NHP because of his behavior/getting kicked out of facilities.  Unsure what CSW may be able to do for this pt, but will ask dayshift CSW to f/u with him in waiting room in the am.  Pt informed and agreeable.

## 2013-01-18 NOTE — ED Notes (Signed)
Jody, SW here to see pt and pt states he wants nursing home placement.
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
## 2013-01-18 NOTE — ED Notes (Signed)
PT. ARRIVED WITH EMS FROM STREET ( HOMELESS) ON WHEELCHAIR REPORTS CHRONIC LOW BACK PAIN RADIATING TO RIGHT LEG FOR SEVERAL WEEKS , DENIES RECENT FALL OR INJURY ,  ALSO REPORTED DRINKING MOUTHWASH TODAY FOR PAIN RELIEF.

## 2013-01-18 NOTE — ED Notes (Signed)
Pt to be discharged and pt given phone to call friend for ride/place to stay

## 2013-01-18 NOTE — ED Notes (Signed)
Pt urinated in brief. Pt states he has 2 briefs on and both are soiled. Pt states he needs new clothes. Pt given paper scrubs. Pt states he cannot walk. Pt will be cleaned up and dressed. Ptar will be called to pick patient up.

## 2013-01-18 NOTE — ED Notes (Signed)
Pt 's friend not able to pick him up and his wheelchair was left at the [employer] on [address]. Dahlia Client, PA updated re: pt without wheelchair and no one to pick him up. States will consult SW

## 2013-01-18 NOTE — ED Notes (Signed)
Pt requesting pain med and nicotine patch. Gavin Pound, PA updated and verbal order rec'd for Percocet 1 tab by mouth x1 and Nicotine Patch 21mg  x1

## 2013-01-18 NOTE — ED Notes (Signed)
NURSE'S TRIAGE / HISTORY NOTES ENTERED BY Betsey Holiday RN NOT  Ranell Patrick RN .

## 2013-01-18 NOTE — ED Notes (Signed)
I was rounding on patient's in fast track when I heard the patient in Fast Track room 7 yell "I fell".  Patient was sitting in front of the wheelchair when found.  Patient is wheelchair bound, came in via EMS, was rolled into FT 7 room without being transferred to bed.    Door was closed and patient reports he was trying to open it, when he leaned forward patient "slipped" out of wheelchair.  Patient was triaged as a "low fall risk" initially, I changed to "high fall risk".  Staff placed yellow arm band and red socks, door is now open, patient has call light, patient instructed to ask for assistance with needs.  No injuries noted, Huey Romans took a look at patient.  Patient transferred to bed with assistance. Fall Huddle completed, charge nurse aware.

## 2013-01-19 LAB — RAPID URINE DRUG SCREEN, HOSP PERFORMED
Barbiturates: NOT DETECTED
Cocaine: NOT DETECTED

## 2013-01-19 MED ORDER — METOPROLOL SUCCINATE ER 25 MG PO TB24
25.0000 mg | ORAL_TABLET | Freq: Every day | ORAL | Status: DC
  Administered 2013-01-19: 25 mg via ORAL
  Filled 2013-01-19: qty 1

## 2013-01-19 MED ORDER — GABAPENTIN 600 MG PO TABS
600.0000 mg | ORAL_TABLET | Freq: Three times a day (TID) | ORAL | Status: DC
  Filled 2013-01-19 (×3): qty 1

## 2013-01-19 MED ORDER — METOPROLOL SUCCINATE ER 25 MG PO TB24
25.0000 mg | ORAL_TABLET | Freq: Every day | ORAL | Status: DC
  Filled 2013-01-19: qty 1

## 2013-01-19 MED ORDER — GABAPENTIN 600 MG PO TABS
600.0000 mg | ORAL_TABLET | Freq: Three times a day (TID) | ORAL | Status: DC
  Administered 2013-01-19 (×2): 600 mg via ORAL
  Filled 2013-01-19 (×2): qty 1

## 2013-01-19 NOTE — ED Provider Notes (Signed)
Patient has chronic pain, chronic narcotic use, homeless, wheelchair-bound from chronic low back pain, chronic incontinence, apparently unremarkable MRI this year, primary care Dr. will no longer prescribe narcotics, apparently patient does not qualify for skilled nursing facility for pain management either based on his prior behaviors with history of substance abuse, alcohol abuse, and prior overdose with suicidal ideation, patient made aware the ED does not manage chronic pain or prescribe routine refills for chronic narcotics, patient does not appear to be in severe withdrawal and states he only came to the emergency department for chronic pain which has been unchanged for years.  Medical screening examination/treatment/procedure(s) were conducted as a shared visit with non-physician practitioner(s) and myself.  I personally evaluated the patient during the encounter.  CM knows Pt well and has seen Pt, Pt has been in 3 SNFs and shelters won't take him back either, CM requests PT eval again for possible SNF placement again. 1610  CM was attempting placement but Pt decided he wants discharge instead.  Hurman Horn, MD 01/29/13 [id number]

## 2013-01-19 NOTE — ED Notes (Signed)
Fonnie Jarvis, MD aware of pt request to speak with him re: pain manangement, pt informed that Fonnie Jarvis, MD is aware

## 2013-01-19 NOTE — Progress Notes (Signed)
ED CM Consulted on pt for DME for w/c.Marland Kitchen Spoke with patient he states he left his personal w/c at [facility] after he called EMS to transport him to ED. Explained to patient that Medicaid will not cover another w/c. He would need to make arrangement to retrieve his w/c, Cassandra SW made aware. No other CM needs noted.

## 2013-01-19 NOTE — ED Notes (Signed)
Pt able to transfer from bed to wheelchair to go restroom for bm

## 2013-01-19 NOTE — ED Notes (Signed)
Bednar, MD at bedside. 

## 2013-01-19 NOTE — ED Notes (Addendum)
Patient called nurse into room and began cursing and demanding that his rx be returned to him. States "that doctor stole my prescription". Dr bednar evidently removed the percocet rx from pt this morning. Case manager in and spoke with pt. He continues to curse at staff and degrade services offered to him and for him. Patient has decided that he wants to leave the hospital and not be admitted to a group home or a facility.

## 2013-01-19 NOTE — ED Notes (Signed)
Patient with intermittent grunting and moaning. He is able to sit up on side of bed and eat lunch without any verbalization of pain or discomfort

## 2013-01-19 NOTE — ED Notes (Signed)
Spoke with Social work re: plan of care of pt, Social work will Freight forwarder & MD on disposition options

## 2013-01-19 NOTE — Progress Notes (Signed)
CSW spoke with several facilities concerning Half-Way homes that may be able to accommodate residents with handicaps.   CSW Merchant navy officer of [facility] in this area [place] [phone]) to assess availability in this area and director will contact his facilities that are handicap and contact CSW back with available beds.    CSW will continue to work on a d/c plan for Pt.   Leron Croak, LCSWA [facility] Emergency Dept.  [phone]

## 2013-01-19 NOTE — Progress Notes (Signed)
Clinical Social Work Department BRIEF PSYCHOSOCIAL ASSESSMENT 01/19/2013  Patient:  Jeffrey [name], Jeffrey [name]     Account Number:  [id number]     Admit date:  01/18/2013  Clinical Social Worker:  Leron Croak, CLINICAL SOCIAL WORKER  Date/Time:  01/19/2013 10:16 AM  Referred by:  Physician  Date Referred:  01/19/2013 Referred for  Homelessness  Other - See comment   Other Referral:   Pt has medical concerns and Pt stated that is why he continues to come to the ED.   Interview type:  Patient Other interview type:    PSYCHOSOCIAL DATA Living Status:  OTHER Admitted from facility:   Level of care:   Primary support name:  Alinda Money [phone] Primary support relationship to patient:  FRIEND Degree of support available:   Pt has little to no support systems and due to his behavioral health conccerns is having difficulty finding shelter.    CURRENT CONCERNS Current Concerns  Other - See comment   Other Concerns:   Pt is homeless and has limited resources. CSW also is having difficulty complying with given resources in the past. Pt continues to make excuses and is not attemping to help himself.    SOCIAL WORK ASSESSMENT / PLAN CSW met with the Pt to discuss d/c planning. CSW introduced self and began assessment by asking Pt what steps he had taken to secure housing and other medical care needed. Pt stated due to limited (telephonic) resources he was unable to follow up on recommendations in the past ED admissions. Pt has a history of drug addiction and behavioral concerns that have limited assistance that would be available. Pt is willing to cooperate with any assistance that can be provided at this time. CSW consulted with supervisor concerning placement options for this Pt and due to his failed placement attempts in the past he (Zack Brrokes) feels helping Pt would be best suited in a halfway home until SNF placement could be offered.   Assessment/plan status:  Information/Referral to  [facility] Other assessment/ plan:   Information/referral to community resources:   CSW has not given resources at this time and will supply Pt with resources upon D/C.    PATIENT'S/FAMILY'S RESPONSE TO PLAN OF CARE: Pt is agreeable to any assistance available for him at this time.     Leron Croak, LCSWA [facility] Emergency Dept.  [phone]

## 2013-01-19 NOTE — ED Notes (Signed)
SOCIAL WORKER IN TO TALK TO PATIENT

## 2013-01-20 ENCOUNTER — Encounter (HOSPITAL_COMMUNITY): Payer: Self-pay | Admitting: Emergency Medicine

## 2013-01-20 ENCOUNTER — Emergency Department (HOSPITAL_COMMUNITY)
Admission: EM | Admit: 2013-01-20 | Discharge: 2013-01-20 | Disposition: A | Discharge status: Home / Self Care | Payer: Medicaid Other | Attending: Emergency Medicine | Admitting: Emergency Medicine

## 2013-01-20 DIAGNOSIS — G309 Alzheimer's disease, unspecified: Secondary | ICD-10-CM | POA: Insufficient documentation

## 2013-01-20 DIAGNOSIS — IMO0002 Reserved for concepts with insufficient information to code with codable children: Secondary | ICD-10-CM | POA: Insufficient documentation

## 2013-01-20 DIAGNOSIS — G8929 Other chronic pain: Secondary | ICD-10-CM | POA: Insufficient documentation

## 2013-01-20 DIAGNOSIS — F0280 Dementia in other diseases classified elsewhere without behavioral disturbance: Secondary | ICD-10-CM | POA: Insufficient documentation

## 2013-01-20 DIAGNOSIS — F17200 Nicotine dependence, unspecified, uncomplicated: Secondary | ICD-10-CM | POA: Insufficient documentation

## 2013-01-20 DIAGNOSIS — Z79899 Other long term (current) drug therapy: Secondary | ICD-10-CM | POA: Insufficient documentation

## 2013-01-20 DIAGNOSIS — Z8619 Personal history of other infectious and parasitic diseases: Secondary | ICD-10-CM | POA: Insufficient documentation

## 2013-01-20 DIAGNOSIS — F3289 Other specified depressive episodes: Secondary | ICD-10-CM | POA: Insufficient documentation

## 2013-01-20 DIAGNOSIS — I10 Essential (primary) hypertension: Secondary | ICD-10-CM | POA: Insufficient documentation

## 2013-01-20 DIAGNOSIS — Z8739 Personal history of other diseases of the musculoskeletal system and connective tissue: Secondary | ICD-10-CM | POA: Insufficient documentation

## 2013-01-20 DIAGNOSIS — F329 Major depressive disorder, single episode, unspecified: Secondary | ICD-10-CM

## 2013-01-20 LAB — RAPID URINE DRUG SCREEN, HOSP PERFORMED
Opiates: NOT DETECTED
Tetrahydrocannabinol: NOT DETECTED

## 2013-01-20 LAB — SALICYLATE LEVEL: Salicylate Lvl: <2 mg/dL — ABNORMAL LOW (ref 2.8–20.0)

## 2013-01-20 LAB — COMPREHENSIVE METABOLIC PANEL
Alkaline Phosphatase: 89 U/L (ref 39–117)
BUN: 16 mg/dL (ref 6–23)
CO2: 25 mEq/L (ref 19–32)
Chloride: 110 mEq/L (ref 96–112)
Creatinine, Ser: 0.62 mg/dL (ref 0.50–1.35)
GFR calc non Af Amer: >90 mL/min (ref >90)
Potassium: 3.9 mEq/L (ref 3.5–5.1)
Total Bilirubin: 0.2 mg/dL — ABNORMAL LOW (ref 0.3–1.2)

## 2013-01-20 LAB — ACETAMINOPHEN LEVEL: Acetaminophen (Tylenol), Serum: <15 ug/mL (ref 10–30)

## 2013-01-20 LAB — CBC WITH DIFFERENTIAL/PLATELET
HCT: 47.1 % (ref 39.0–52.0)
Hemoglobin: 15.8 g/dL (ref 13.0–17.0)
Lymphocytes Relative: 31 % (ref 12–46)
Lymphs Abs: 2.2 10*3/uL (ref 0.7–4.0)
Monocytes Absolute: 0.7 10*3/uL (ref 0.1–1.0)
Monocytes Relative: 10 % (ref 3–12)
Neutro Abs: 3.9 10*3/uL (ref 1.7–7.7)
RBC: 5.36 MIL/uL (ref 4.22–5.81)
WBC: 7 10*3/uL (ref 4.0–10.5)

## 2013-01-20 LAB — ETHANOL: Alcohol, Ethyl (B): 185 mg/dL — ABNORMAL HIGH (ref 0–11)

## 2013-01-20 MED ORDER — DIAZEPAM 5 MG/ML IJ SOLN
5.0000 mg | Freq: Once | INTRAMUSCULAR | Status: AC
  Administered 2013-01-20: 5 mg via INTRAMUSCULAR
  Filled 2013-01-20: qty 2

## 2013-01-20 MED ORDER — LORAZEPAM 2 MG/ML IJ SOLN
1.0000 mg | Freq: Once | INTRAMUSCULAR | Status: AC
  Administered 2013-01-20: 1 mg via INTRAMUSCULAR
  Filled 2013-01-20: qty 1

## 2013-01-20 NOTE — BHH Counselor (Signed)
Pt information discussed with Shuvon Rankin-NP, pt declined.  "Pt in need long term treatment and would not benefit from acute short term treatment".
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
## 2013-01-20 NOTE — ED Provider Notes (Signed)
Medical screening examination/treatment/procedure(s) were performed by non-physician practitioner and as supervising physician I was immediately available for consultation/collaboration.   Lanayah Gartley, MD 01/20/13 0715 
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
## 2013-01-20 NOTE — ED Provider Notes (Signed)
History     CSN: [id number]  Arrival date & time 01/20/13  1234   First MD Initiated Contact with Patient 01/20/13 1237      Chief Complaint  Patient presents with  . Suicidal    (Consider location/radiation/quality/duration/timing/severity/associated sxs/prior treatment) HPI Comments: Patient brought in today by [employer] due to making thoughts of wanting to kill himself.  He apparently was asking them for their gun and  threatening to shoot himself.  He is also stating that he is having homicidal thoughts towards other individuals with plan to shoot them.  He does not currently have a gun.   He is currently wheel chair bound and is homeless.  He is also complaining of chronic back pain and is wanting pain medication.  He has been told numerous times that the Emergency Department will not treat this chronic pain.  He denies any other physical complaints at this time.  The history is provided by the patient.    Past Medical History  Diagnosis Date  . DDD (degenerative disc disease)   . Necrotizing fasciitis   . Degenerative disc disease   . Hypertension   . Degenerative disc disease   . Hepatitis C   . Chronic pain   . DDD (degenerative disc disease)   . Parkinson disease   . Mental disorder   . Depression   . DDD (degenerative disc disease)   . DDD (degenerative disc disease), lumbar     Past Surgical History  Procedure Laterality Date  . Hip surgery    . Appendectomy    . Ankle surgery    . Incision and drainage of wound N/A 11/26/2012    Procedure: IRRIGATION AND DEBRIDEMENT WOUND;  Surgeon: Emelia Loron, MD;  Location: [facility] OR;  Service: General;  Laterality: N/A;  . Wound exploration N/A 11/26/2012    Procedure: WOUND EXPLORATION;  Surgeon: Emelia Loron, MD;  Location: [facility] OR;  Service: General;  Laterality: N/A;    No family history on file.  History  Substance Use Topics  . Smoking status: Current Every Day Smoker -- 1.00 packs/day    Types: Cigarettes   Last Attempt to Quit: 11/27/2012  . Smokeless tobacco: Current User    Types: Chew  . Alcohol Use: 1.5 oz/week     Comment: anything he can get his hands on      Review of Systems  Psychiatric/Behavioral: Positive for suicidal ideas and agitation. Negative for hallucinations and confusion.  All other systems reviewed and are negative.    Allergies  Review of patient's allergies indicates no known allergies.  Home Medications   Current Outpatient Rx  Name  Route  Sig  Dispense  Refill  . gabapentin (NEURONTIN) 600 MG tablet   Oral   Take 1 tablet (600 mg total) by mouth 3 (three) times daily.   90 tablet   2   . metoprolol succinate (TOPROL-XL) 25 MG 24 hr tablet   Oral   Take 1 tablet (25 mg total) by mouth daily.   30 tablet   3   . traMADol (ULTRAM) 50 MG tablet   Oral   Take 2 tablets (100 mg total) by mouth every 6 (six) hours as needed for pain.   240 tablet   0     BP 147/80  Pulse 89  Temp(Src) 97.2 F (36.2 C) (Oral)  Resp 18  SpO2 100%  Physical Exam  Nursing note and vitals reviewed. Constitutional: He appears well-developed and well-nourished.  HENT:  Head:  Normocephalic and atraumatic.  Neck: Normal range of motion. Neck supple.  Cardiovascular: Normal rate, regular rhythm and normal heart sounds.   Pulmonary/Chest: Effort normal and breath sounds normal.  Neurological: He is alert.  Psychiatric: His affect is angry. He is agitated and aggressive. He expresses homicidal and suicidal ideation. He expresses suicidal plans and homicidal plans.    ED Course  Procedures (including critical care time)  Labs Reviewed  CBC WITH DIFFERENTIAL  COMPREHENSIVE METABOLIC PANEL  ETHANOL  URINE RAPID DRUG SCREEN (HOSP PERFORMED)  SALICYLATE LEVEL  ACETAMINOPHEN LEVEL   No results found.   No diagnosis found.  5:25 PM Telepsych completed and Psychiatrist felt that the patient was stable for discharge.  MDM  Patient brought in by [employer]  voluntarily after making comments of wanting to shoot himself with a gun.  He currently does not have access to a gun.  Telepsych was performed and Psychiatrist felt that patient was OK for discharge.  Patient discharged.        Pascal Lux Herbst, PA-C 01/22/13 1231

## 2013-01-22 ENCOUNTER — Inpatient Hospital Stay: Payer: Self-pay | Admitting: Family Medicine

## 2013-01-23 ENCOUNTER — Other Ambulatory Visit: Payer: Self-pay | Admitting: Family Medicine

## 2013-01-23 ENCOUNTER — Encounter: Payer: Self-pay | Admitting: Family Medicine

## 2013-01-23 ENCOUNTER — Emergency Department (HOSPITAL_COMMUNITY)
Admission: EM | Admit: 2013-01-23 | Discharge: 2013-01-23 | Disposition: A | Discharge status: Home / Self Care | Payer: Medicaid Other | Attending: Emergency Medicine | Admitting: Emergency Medicine

## 2013-01-23 ENCOUNTER — Encounter (HOSPITAL_COMMUNITY): Payer: Self-pay | Admitting: *Deleted

## 2013-01-23 DIAGNOSIS — M79609 Pain in unspecified limb: Secondary | ICD-10-CM | POA: Insufficient documentation

## 2013-01-23 DIAGNOSIS — Z8619 Personal history of other infectious and parasitic diseases: Secondary | ICD-10-CM | POA: Insufficient documentation

## 2013-01-23 DIAGNOSIS — I10 Essential (primary) hypertension: Secondary | ICD-10-CM | POA: Insufficient documentation

## 2013-01-23 DIAGNOSIS — Z8739 Personal history of other diseases of the musculoskeletal system and connective tissue: Secondary | ICD-10-CM | POA: Insufficient documentation

## 2013-01-23 DIAGNOSIS — F1010 Alcohol abuse, uncomplicated: Secondary | ICD-10-CM | POA: Insufficient documentation

## 2013-01-23 DIAGNOSIS — Z8659 Personal history of other mental and behavioral disorders: Secondary | ICD-10-CM | POA: Insufficient documentation

## 2013-01-23 DIAGNOSIS — Z9889 Other specified postprocedural states: Secondary | ICD-10-CM | POA: Insufficient documentation

## 2013-01-23 DIAGNOSIS — F17200 Nicotine dependence, unspecified, uncomplicated: Secondary | ICD-10-CM | POA: Insufficient documentation

## 2013-01-23 DIAGNOSIS — G8929 Other chronic pain: Secondary | ICD-10-CM | POA: Insufficient documentation

## 2013-01-23 DIAGNOSIS — Z8669 Personal history of other diseases of the nervous system and sense organs: Secondary | ICD-10-CM | POA: Insufficient documentation

## 2013-01-23 MED ORDER — KETOROLAC TROMETHAMINE 60 MG/2ML IM SOLN
60.0000 mg | Freq: Once | INTRAMUSCULAR | Status: AC
  Administered 2013-01-23: 60 mg via INTRAMUSCULAR
  Filled 2013-01-23: qty 2
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
## 2013-01-23 NOTE — Telephone Encounter (Signed)
FYI---Received release of information yesterday from [facility] requesting patients records.  Gaylene Brooks, RN

## 2013-01-23 NOTE — Telephone Encounter (Signed)
Error

## 2013-01-23 NOTE — ED Provider Notes (Signed)
History     CSN: [id number]  Arrival date & time 01/23/13  0407   First MD Initiated Contact with Patient 01/23/13 [phone]      Chief Complaint  Patient presents with  . Leg Pain    (Consider location/radiation/quality/duration/timing/severity/associated sxs/prior treatment) HPI 57 yo male presents to the ER via EMS with complaint of bilateral leg pain.  Pt is intoxicated.  On my exam, pt is tearful and wants me to tell him why he has pain every day.  Pt recently seen in the ER over the last few days, has had 23 visits to the ER in the last 6 months.  Pt is noncompliant with following up with primary care doctors and pain clinic.  Pt with no new complaints today.  Past Medical History  Diagnosis Date  . DDD (degenerative disc disease)   . Necrotizing fasciitis   . Degenerative disc disease   . Hypertension   . Degenerative disc disease   . Hepatitis C   . Chronic pain   . DDD (degenerative disc disease)   . Parkinson disease   . Mental disorder   . Depression   . DDD (degenerative disc disease)   . DDD (degenerative disc disease), lumbar     Past Surgical History  Procedure Laterality Date  . Hip surgery    . Appendectomy    . Ankle surgery    . Incision and drainage of wound N/A 11/26/2012    Procedure: IRRIGATION AND DEBRIDEMENT WOUND;  Surgeon: Emelia Loron, MD;  Location: [facility] OR;  Service: General;  Laterality: N/A;  . Wound exploration N/A 11/26/2012    Procedure: WOUND EXPLORATION;  Surgeon: Emelia Loron, MD;  Location: [facility] OR;  Service: General;  Laterality: N/A;    History reviewed. No pertinent family history.  History  Substance Use Topics  . Smoking status: Current Every Day Smoker -- 1.00 packs/day    Types: Cigarettes    Last Attempt to Quit: 11/27/2012  . Smokeless tobacco: Current User    Types: Chew  . Alcohol Use: 1.5 oz/week     Comment: anything he can get his hands on      Review of Systems  Unable to perform ROS: Other    intoxication Allergies  Review of patient's allergies indicates no known allergies.  Home Medications   Current Outpatient Rx  Name  Route  Sig  Dispense  Refill  . Oxycodone-Acetaminophen (PERCOCET PO)   Oral   Take 1 tablet by mouth every 6 (six) hours as needed (pain).         Marland Kitchen PRESCRIPTION MEDICATION   Oral   Take 1 tablet by mouth. antibiotics           BP 131/86  Pulse 69  Temp(Src) 97.3 F (36.3 C)  SpO2 98%  Physical Exam  Nursing note and vitals reviewed. Constitutional: He appears distressed (tearful).  Cardiovascular: Normal rate, regular rhythm, normal heart sounds and intact distal pulses.  Exam reveals no gallop and no friction rub.   No murmur heard. Pulmonary/Chest: Effort normal and breath sounds normal. No respiratory distress. He has no wheezes. He has no rales. He exhibits no tenderness.  Musculoskeletal:  Scarring noted to back, lower extremities from prior surgeries  Psychiatric:  Tearful, upset, intoxicated but not acutely suicidal    ED Course  Procedures (including critical care time)  Labs Reviewed - No data to display No results found.   1. Chronic pain       MDM  57 yo male who presents to the ER with complaint of acute on chronic pain as well as intoxication.  Pt to receive toradol.  He has received referrals and resources in the past. Stable for discharge.        Olivia Mackie, MD 01/23/13 2138

## 2013-01-23 NOTE — Telephone Encounter (Signed)
CSW left a message for [facility] regarding pt needing an FL2.   CSW will not be able to assist with placement however will complete an FL2 for shelter case manager to assist with placement.  Theresia Bough, MSW, Theresia Majors [phone]

## 2013-01-23 NOTE — ED Notes (Signed)
MD at bedside. 

## 2013-01-24 ENCOUNTER — Telehealth: Payer: Self-pay | Admitting: *Deleted

## 2013-01-24 NOTE — Telephone Encounter (Signed)
Received phone call from [facility] stating patient was in store, on 5/20 and 01/24/13, stating that original prescription for Tramadol was written for 6 refills that was orginialy filled at [facility].  I called [facility] [phone]) and they stated that on 12/27/12 a script was filled at the [facility] in [facility] for Tramadol two tablets by mouth every six hours prn, disp #240 with zero(0) refills.  According to Dr. Tye Savoy OV note from that day, no refills were given.  Called Doug today at [facility] and let him know and to tell patient, who was still at the pharmacy, that MD was unavailable until Tuesday May 27.  Also, please note, patient was seen in ED on 01/23/2013  Radene Ou, CMA

## 2013-01-24 NOTE — Telephone Encounter (Signed)
Please see note below---patient using profanity towards clinical staff.  Will route note to Dennison Nancy, Interior and spatial designer.  Gaylene Brooks, RN

## 2013-01-24 NOTE — Telephone Encounter (Signed)
Rec'd phone call from patient who was still at the pharmacy wanting refill on Tramadol. I instructed patient we were unable to refill the Tramadol until seen by Dr. Tye Savoy.  Patient states the ED wouldn't give him any meds when he was seen there 5/15,5/17, and 01/23/13.  Patient then states that his "doctor took him off the good s**t and is giving him this organic nuts and berries s**t now and so Im going to get a fifth of liquor, you bi**h".  I said, "Have a good day, Sir." and hung up the phone.  Jeffrey [name], Darlyne Russian, CMA

## 2013-01-24 NOTE — Telephone Encounter (Signed)
This encounter was created in error - please disregard.

## 2013-01-24 NOTE — ED Provider Notes (Signed)
Medical screening examination/treatment/procedure(s) were performed by non-physician practitioner and as supervising physician I was immediately available for consultation/collaboration.   David H Yao, MD 01/24/13 0924 

## 2013-01-25 ENCOUNTER — Emergency Department (HOSPITAL_COMMUNITY)
Admission: EM | Admit: 2013-01-25 | Discharge: 2013-01-25 | Disposition: A | Discharge status: Home / Self Care | Payer: Medicaid Other | Attending: Emergency Medicine | Admitting: Emergency Medicine

## 2013-01-25 ENCOUNTER — Emergency Department (HOSPITAL_COMMUNITY): Payer: Medicaid Other

## 2013-01-25 ENCOUNTER — Encounter: Payer: Self-pay | Admitting: *Deleted

## 2013-01-25 ENCOUNTER — Encounter (HOSPITAL_COMMUNITY): Payer: Self-pay | Admitting: *Deleted

## 2013-01-25 DIAGNOSIS — R29898 Other symptoms and signs involving the musculoskeletal system: Secondary | ICD-10-CM

## 2013-01-25 DIAGNOSIS — Z8739 Personal history of other diseases of the musculoskeletal system and connective tissue: Secondary | ICD-10-CM | POA: Insufficient documentation

## 2013-01-25 DIAGNOSIS — I10 Essential (primary) hypertension: Secondary | ICD-10-CM | POA: Insufficient documentation

## 2013-01-25 DIAGNOSIS — G20A1 Parkinson's disease without dyskinesia, without mention of fluctuations: Secondary | ICD-10-CM | POA: Insufficient documentation

## 2013-01-25 DIAGNOSIS — G20 Parkinson's disease: Secondary | ICD-10-CM | POA: Insufficient documentation

## 2013-01-25 DIAGNOSIS — IMO0002 Reserved for concepts with insufficient information to code with codable children: Secondary | ICD-10-CM | POA: Insufficient documentation

## 2013-01-25 DIAGNOSIS — F17200 Nicotine dependence, unspecified, uncomplicated: Secondary | ICD-10-CM | POA: Insufficient documentation

## 2013-01-25 DIAGNOSIS — Z8619 Personal history of other infectious and parasitic diseases: Secondary | ICD-10-CM | POA: Insufficient documentation

## 2013-01-25 DIAGNOSIS — G8929 Other chronic pain: Secondary | ICD-10-CM | POA: Insufficient documentation

## 2013-01-25 DIAGNOSIS — M6281 Muscle weakness (generalized): Secondary | ICD-10-CM | POA: Insufficient documentation

## 2013-01-25 DIAGNOSIS — Z79899 Other long term (current) drug therapy: Secondary | ICD-10-CM | POA: Insufficient documentation

## 2013-01-25 DIAGNOSIS — Z8659 Personal history of other mental and behavioral disorders: Secondary | ICD-10-CM | POA: Insufficient documentation
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
## 2013-01-25 NOTE — ED Provider Notes (Signed)
Medical screening examination/treatment/procedure(s) were performed by non-physician practitioner and as supervising physician I was immediately available for consultation/collaboration.   Gwyneth Sprout, MD 01/25/13 1500

## 2013-01-25 NOTE — ED Notes (Signed)
Patient states he has chronic pain issues and now has right arm pain and hand not working properly.  Patient upset due to the fact he cannot hold his cigarettes.  Patient out of pain medication at this time.  Patient able to grasp with hands bilaterally and move arms appropriately.

## 2013-01-25 NOTE — ED Notes (Signed)
Patient d/c'd with PTAR in NAD at time of d/c

## 2013-01-26 ENCOUNTER — Emergency Department (HOSPITAL_COMMUNITY)
Admission: EM | Admit: 2013-01-26 | Discharge: 2013-01-26 | Disposition: A | Discharge status: Skilled Nursing Facility | Payer: Medicaid Other | Attending: Emergency Medicine | Admitting: Emergency Medicine

## 2013-01-26 ENCOUNTER — Encounter (HOSPITAL_COMMUNITY): Payer: Self-pay | Admitting: Emergency Medicine

## 2013-01-26 DIAGNOSIS — M79609 Pain in unspecified limb: Secondary | ICD-10-CM | POA: Insufficient documentation

## 2013-01-26 DIAGNOSIS — Z8659 Personal history of other mental and behavioral disorders: Secondary | ICD-10-CM | POA: Insufficient documentation

## 2013-01-26 DIAGNOSIS — M6281 Muscle weakness (generalized): Secondary | ICD-10-CM | POA: Insufficient documentation

## 2013-01-26 DIAGNOSIS — Z8619 Personal history of other infectious and parasitic diseases: Secondary | ICD-10-CM | POA: Insufficient documentation

## 2013-01-26 DIAGNOSIS — IMO0002 Reserved for concepts with insufficient information to code with codable children: Secondary | ICD-10-CM | POA: Insufficient documentation

## 2013-01-26 DIAGNOSIS — R209 Unspecified disturbances of skin sensation: Secondary | ICD-10-CM | POA: Insufficient documentation

## 2013-01-26 DIAGNOSIS — Z8739 Personal history of other diseases of the musculoskeletal system and connective tissue: Secondary | ICD-10-CM | POA: Insufficient documentation

## 2013-01-26 DIAGNOSIS — I10 Essential (primary) hypertension: Secondary | ICD-10-CM | POA: Insufficient documentation

## 2013-01-26 DIAGNOSIS — G20 Parkinson's disease: Secondary | ICD-10-CM | POA: Insufficient documentation

## 2013-01-26 DIAGNOSIS — Z79899 Other long term (current) drug therapy: Secondary | ICD-10-CM | POA: Insufficient documentation

## 2013-01-26 DIAGNOSIS — M549 Dorsalgia, unspecified: Secondary | ICD-10-CM | POA: Insufficient documentation

## 2013-01-26 DIAGNOSIS — G8929 Other chronic pain: Secondary | ICD-10-CM | POA: Insufficient documentation

## 2013-01-26 DIAGNOSIS — F3289 Other specified depressive episodes: Secondary | ICD-10-CM | POA: Insufficient documentation

## 2013-01-26 DIAGNOSIS — F17200 Nicotine dependence, unspecified, uncomplicated: Secondary | ICD-10-CM | POA: Insufficient documentation

## 2013-01-26 DIAGNOSIS — G20A1 Parkinson's disease without dyskinesia, without mention of fluctuations: Secondary | ICD-10-CM | POA: Insufficient documentation

## 2013-01-26 DIAGNOSIS — F329 Major depressive disorder, single episode, unspecified: Secondary | ICD-10-CM | POA: Insufficient documentation

## 2013-01-26 LAB — URINALYSIS, ROUTINE W REFLEX MICROSCOPIC
Bilirubin Urine: NEGATIVE
Ketones, ur: NEGATIVE mg/dL
Nitrite: NEGATIVE
pH: 6 (ref 5.0–8.0)

## 2013-01-26 MED ORDER — TRAMADOL HCL 50 MG PO TABS: 50.0000 mg | ORAL_TABLET | Freq: Once | ORAL | Status: DC

## 2013-01-26 MED ORDER — GABAPENTIN 300 MG PO CAPS
600.0000 mg | ORAL_CAPSULE | Freq: Once | ORAL | Status: AC
  Administered 2013-01-26: 600 mg via ORAL
  Filled 2013-01-26: qty 2

## 2013-01-26 MED ORDER — TRAMADOL HCL 50 MG PO TABS
50.0000 mg | ORAL_TABLET | Freq: Once | ORAL | Status: AC
  Administered 2013-01-26: 50 mg via ORAL
  Filled 2013-01-26: qty 1

## 2013-01-26 NOTE — ED Notes (Signed)
Pt waiting for PTAR pick-up
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
## 2013-01-26 NOTE — ED Notes (Signed)
[id number]> Expected date:<BR> Expected time:<BR> Means of arrival:<BR> Comments:<BR> EMS - back pain

## 2013-01-26 NOTE — ED Notes (Addendum)
Pt here via ems for c/o back pain  Seen at [facility] 01/25/13 for same was told to go to PCP for pain meds/meds  Unable to see him so he came back to ED Pt stated he fell at [facility]  ems brought him to [facility] yesterday for his back pain

## 2013-01-27 ENCOUNTER — Emergency Department (HOSPITAL_COMMUNITY)
Admission: EM | Admit: 2013-01-27 | Discharge: 2013-01-28 | Disposition: A | Discharge status: Home / Self Care | Payer: Medicaid Other | Attending: Emergency Medicine | Admitting: Emergency Medicine

## 2013-01-27 DIAGNOSIS — Z5900 Homelessness unspecified: Secondary | ICD-10-CM | POA: Insufficient documentation

## 2013-01-27 DIAGNOSIS — R5381 Other malaise: Secondary | ICD-10-CM | POA: Insufficient documentation

## 2013-01-27 DIAGNOSIS — I10 Essential (primary) hypertension: Secondary | ICD-10-CM | POA: Insufficient documentation

## 2013-01-27 DIAGNOSIS — Z8659 Personal history of other mental and behavioral disorders: Secondary | ICD-10-CM | POA: Insufficient documentation

## 2013-01-27 DIAGNOSIS — M51379 Other intervertebral disc degeneration, lumbosacral region without mention of lumbar back pain or lower extremity pain: Secondary | ICD-10-CM | POA: Insufficient documentation

## 2013-01-27 DIAGNOSIS — Z8739 Personal history of other diseases of the musculoskeletal system and connective tissue: Secondary | ICD-10-CM | POA: Insufficient documentation

## 2013-01-27 DIAGNOSIS — F329 Major depressive disorder, single episode, unspecified: Secondary | ICD-10-CM | POA: Insufficient documentation

## 2013-01-27 DIAGNOSIS — G20 Parkinson's disease: Secondary | ICD-10-CM | POA: Insufficient documentation

## 2013-01-27 DIAGNOSIS — M545 Low back pain, unspecified: Secondary | ICD-10-CM | POA: Insufficient documentation

## 2013-01-27 DIAGNOSIS — G20A1 Parkinson's disease without dyskinesia, without mention of fluctuations: Secondary | ICD-10-CM | POA: Insufficient documentation

## 2013-01-27 DIAGNOSIS — Z79899 Other long term (current) drug therapy: Secondary | ICD-10-CM | POA: Insufficient documentation

## 2013-01-27 DIAGNOSIS — G8929 Other chronic pain: Secondary | ICD-10-CM | POA: Insufficient documentation

## 2013-01-27 DIAGNOSIS — M5137 Other intervertebral disc degeneration, lumbosacral region: Secondary | ICD-10-CM | POA: Insufficient documentation

## 2013-01-27 DIAGNOSIS — Z993 Dependence on wheelchair: Secondary | ICD-10-CM | POA: Insufficient documentation

## 2013-01-27 DIAGNOSIS — Z8619 Personal history of other infectious and parasitic diseases: Secondary | ICD-10-CM | POA: Insufficient documentation

## 2013-01-27 DIAGNOSIS — F17200 Nicotine dependence, unspecified, uncomplicated: Secondary | ICD-10-CM | POA: Insufficient documentation

## 2013-01-27 DIAGNOSIS — R531 Weakness: Secondary | ICD-10-CM

## 2013-01-27 DIAGNOSIS — F3289 Other specified depressive episodes: Secondary | ICD-10-CM | POA: Insufficient documentation

## 2013-01-27 LAB — CBC WITH DIFFERENTIAL/PLATELET
Basophils Absolute: 0.1 10*3/uL (ref 0.0–0.1)
Eosinophils Relative: 1 % (ref 0–5)
HCT: 46.6 % (ref 39.0–52.0)
Hemoglobin: 16 g/dL (ref 13.0–17.0)
Lymphocytes Relative: 26 % (ref 12–46)
Lymphs Abs: 2.5 10*3/uL (ref 0.7–4.0)
MCV: 85.2 fL (ref 78.0–100.0)
Monocytes Absolute: 0.6 10*3/uL (ref 0.1–1.0)
Monocytes Relative: 6 % (ref 3–12)
RDW: 14.8 % (ref 11.5–15.5)
WBC: 9.8 10*3/uL (ref 4.0–10.5)

## 2013-01-27 LAB — BASIC METABOLIC PANEL
BUN: 21 mg/dL (ref 6–23)
CO2: 19 mEq/L (ref 19–32)
Calcium: 8.8 mg/dL (ref 8.4–10.5)
Creatinine, Ser: 0.75 mg/dL (ref 0.50–1.35)
Glucose, Bld: 77 mg/dL (ref 70–99)

## 2013-01-27 NOTE — ED Notes (Signed)
[id number]<JY> Expected date:<BR> Expected time:<BR> Means of arrival:<BR> Comments:<BR> EMS
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
## 2013-01-27 NOTE — ED Notes (Signed)
Pt BIB EMS. Pt states he had a stroke a week ago and is upset that no one is doing anything about it. [place]  EMS states that they frequently transport this pt and there are no new deficits to report. Pt arrives with ETOH on breath. Pt is wheelchair bound and arrives with his wheelchair. Pt told EMS he has R arm weakness today. Pt a/o x 4. No acute distress. Skin warm, dry.

## 2013-01-28 NOTE — ED Provider Notes (Signed)
Medical screening examination/treatment/procedure(s) were performed by non-physician practitioner and as supervising physician I was immediately available for consultation/collaboration.   Laray Anger, DO 01/28/13 2034

## 2013-01-29 ENCOUNTER — Emergency Department (HOSPITAL_COMMUNITY)
Admission: EM | Admit: 2013-01-29 | Discharge: 2013-01-29 | Disposition: A | Discharge status: Home / Self Care | Payer: Medicaid Other | Attending: Emergency Medicine | Admitting: Emergency Medicine

## 2013-01-29 ENCOUNTER — Emergency Department (HOSPITAL_COMMUNITY): Payer: Medicaid Other

## 2013-01-29 DIAGNOSIS — Z79899 Other long term (current) drug therapy: Secondary | ICD-10-CM | POA: Insufficient documentation

## 2013-01-29 DIAGNOSIS — M5137 Other intervertebral disc degeneration, lumbosacral region: Secondary | ICD-10-CM | POA: Insufficient documentation

## 2013-01-29 DIAGNOSIS — W050XXA Fall from non-moving wheelchair, initial encounter: Secondary | ICD-10-CM | POA: Insufficient documentation

## 2013-01-29 DIAGNOSIS — G20 Parkinson's disease: Secondary | ICD-10-CM | POA: Insufficient documentation

## 2013-01-29 DIAGNOSIS — G20A1 Parkinson's disease without dyskinesia, without mention of fluctuations: Secondary | ICD-10-CM | POA: Insufficient documentation

## 2013-01-29 DIAGNOSIS — S20211A Contusion of right front wall of thorax, initial encounter: Secondary | ICD-10-CM

## 2013-01-29 DIAGNOSIS — R32 Unspecified urinary incontinence: Secondary | ICD-10-CM | POA: Insufficient documentation

## 2013-01-29 DIAGNOSIS — S20219A Contusion of unspecified front wall of thorax, initial encounter: Secondary | ICD-10-CM | POA: Insufficient documentation

## 2013-01-29 DIAGNOSIS — Z5900 Homelessness unspecified: Secondary | ICD-10-CM | POA: Insufficient documentation

## 2013-01-29 DIAGNOSIS — M545 Low back pain, unspecified: Secondary | ICD-10-CM | POA: Insufficient documentation

## 2013-01-29 DIAGNOSIS — F17200 Nicotine dependence, unspecified, uncomplicated: Secondary | ICD-10-CM | POA: Insufficient documentation

## 2013-01-29 DIAGNOSIS — Z993 Dependence on wheelchair: Secondary | ICD-10-CM | POA: Insufficient documentation

## 2013-01-29 DIAGNOSIS — I10 Essential (primary) hypertension: Secondary | ICD-10-CM | POA: Insufficient documentation

## 2013-01-29 DIAGNOSIS — Z9889 Other specified postprocedural states: Secondary | ICD-10-CM | POA: Insufficient documentation

## 2013-01-29 DIAGNOSIS — Z8619 Personal history of other infectious and parasitic diseases: Secondary | ICD-10-CM | POA: Insufficient documentation

## 2013-01-29 DIAGNOSIS — M51379 Other intervertebral disc degeneration, lumbosacral region without mention of lumbar back pain or lower extremity pain: Secondary | ICD-10-CM | POA: Insufficient documentation

## 2013-01-29 DIAGNOSIS — Y9389 Activity, other specified: Secondary | ICD-10-CM | POA: Insufficient documentation

## 2013-01-29 DIAGNOSIS — Y92410 Unspecified street and highway as the place of occurrence of the external cause: Secondary | ICD-10-CM | POA: Insufficient documentation

## 2013-01-29 DIAGNOSIS — G8929 Other chronic pain: Secondary | ICD-10-CM | POA: Insufficient documentation

## 2013-01-29 MED ORDER — IBUPROFEN 800 MG PO TABS
800.0000 mg | ORAL_TABLET | Freq: Once | ORAL | Status: AC
  Administered 2013-01-29: 800 mg via ORAL
  Filled 2013-01-29: qty 1

## 2013-01-29 MED ORDER — IBUPROFEN 600 MG PO TABS: 600.0000 mg | ORAL_TABLET | Freq: Four times a day (QID) | ORAL | Status: DC | PRN
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
## 2013-01-29 NOTE — ED Notes (Signed)
NAD noted at time of d/c home 
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
## 2013-01-30 ENCOUNTER — Emergency Department (HOSPITAL_COMMUNITY)
Admission: EM | Admit: 2013-01-30 | Discharge: 2013-01-30 | Disposition: A | Discharge status: Home / Self Care | Payer: Medicaid Other | Attending: Emergency Medicine | Admitting: Emergency Medicine

## 2013-01-30 ENCOUNTER — Encounter (HOSPITAL_COMMUNITY): Payer: Self-pay | Admitting: Emergency Medicine

## 2013-01-30 DIAGNOSIS — IMO0001 Reserved for inherently not codable concepts without codable children: Secondary | ICD-10-CM | POA: Insufficient documentation

## 2013-01-30 DIAGNOSIS — F1010 Alcohol abuse, uncomplicated: Secondary | ICD-10-CM | POA: Insufficient documentation

## 2013-01-30 DIAGNOSIS — I10 Essential (primary) hypertension: Secondary | ICD-10-CM | POA: Insufficient documentation

## 2013-01-30 DIAGNOSIS — Z79899 Other long term (current) drug therapy: Secondary | ICD-10-CM | POA: Insufficient documentation

## 2013-01-30 DIAGNOSIS — G20A1 Parkinson's disease without dyskinesia, without mention of fluctuations: Secondary | ICD-10-CM | POA: Insufficient documentation

## 2013-01-30 DIAGNOSIS — Z87828 Personal history of other (healed) physical injury and trauma: Secondary | ICD-10-CM | POA: Insufficient documentation

## 2013-01-30 DIAGNOSIS — Z8619 Personal history of other infectious and parasitic diseases: Secondary | ICD-10-CM | POA: Insufficient documentation

## 2013-01-30 DIAGNOSIS — F489 Nonpsychotic mental disorder, unspecified: Secondary | ICD-10-CM | POA: Insufficient documentation

## 2013-01-30 DIAGNOSIS — M1990 Unspecified osteoarthritis, unspecified site: Secondary | ICD-10-CM | POA: Insufficient documentation

## 2013-01-30 DIAGNOSIS — G8929 Other chronic pain: Secondary | ICD-10-CM | POA: Insufficient documentation

## 2013-01-30 DIAGNOSIS — F17200 Nicotine dependence, unspecified, uncomplicated: Secondary | ICD-10-CM | POA: Insufficient documentation

## 2013-01-30 DIAGNOSIS — G20 Parkinson's disease: Secondary | ICD-10-CM | POA: Insufficient documentation

## 2013-01-30 DIAGNOSIS — E876 Hypokalemia: Secondary | ICD-10-CM | POA: Insufficient documentation

## 2013-01-30 DIAGNOSIS — F3289 Other specified depressive episodes: Secondary | ICD-10-CM | POA: Insufficient documentation

## 2013-01-30 DIAGNOSIS — F329 Major depressive disorder, single episode, unspecified: Secondary | ICD-10-CM | POA: Insufficient documentation

## 2013-01-30 DIAGNOSIS — F10929 Alcohol use, unspecified with intoxication, unspecified: Secondary | ICD-10-CM

## 2013-01-30 DIAGNOSIS — Z9889 Other specified postprocedural states: Secondary | ICD-10-CM | POA: Insufficient documentation

## 2013-01-30 LAB — ETHANOL: Alcohol, Ethyl (B): 222 mg/dL — ABNORMAL HIGH (ref 0–11)

## 2013-01-30 LAB — RAPID URINE DRUG SCREEN, HOSP PERFORMED
Amphetamines: NOT DETECTED
Cocaine: NOT DETECTED
Opiates: NOT DETECTED
Tetrahydrocannabinol: NOT DETECTED

## 2013-01-30 LAB — COMPREHENSIVE METABOLIC PANEL
BUN: 12 mg/dL (ref 6–23)
CO2: 17 mEq/L — ABNORMAL LOW (ref 19–32)
Calcium: 8.6 mg/dL (ref 8.4–10.5)
Creatinine, Ser: 0.67 mg/dL (ref 0.50–1.35)
GFR calc Af Amer: >90 mL/min (ref >90)
GFR calc non Af Amer: >90 mL/min (ref >90)
Glucose, Bld: 68 mg/dL — ABNORMAL LOW (ref 70–99)
Total Protein: 7.3 g/dL (ref 6.0–8.3)

## 2013-01-30 LAB — CBC
HCT: 43.1 % (ref 39.0–52.0)
Hemoglobin: 15.4 g/dL (ref 13.0–17.0)
WBC: 8.1 10*3/uL (ref 4.0–10.5)

## 2013-01-30 LAB — GLUCOSE, CAPILLARY: Glucose-Capillary: 82 mg/dL (ref 70–99)

## 2013-01-30 MED ORDER — NICOTINE 21 MG/24HR TD PT24
21.0000 mg | MEDICATED_PATCH | Freq: Once | TRANSDERMAL | Status: DC
  Administered 2013-01-30: 21 mg via TRANSDERMAL
  Filled 2013-01-30: qty 1

## 2013-01-30 MED ORDER — POTASSIUM CHLORIDE CRYS ER 20 MEQ PO TBCR
40.0000 meq | EXTENDED_RELEASE_TABLET | Freq: Once | ORAL | Status: AC
  Administered 2013-01-30: 40 meq via ORAL
  Filled 2013-01-30: qty 2

## 2013-01-30 MED ORDER — HYDROCODONE-ACETAMINOPHEN 5-325 MG PO TABS
2.0000 | ORAL_TABLET | Freq: Once | ORAL | Status: AC
  Administered 2013-01-30: 2 via ORAL
  Filled 2013-01-30: qty 2
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
## 2013-01-30 NOTE — ED Notes (Signed)
Telepsych will not do consult until pt is no longer intoxicated and requesting ETOH level be redrawn.  Made EDP [place] aware of Telepsych doctor request.

## 2013-01-30 NOTE — ED Notes (Signed)
Patient is alert and oriented x3.  He was given DC instructions and follow up visit instructions.  Patient gave verbal understanding.  He was DC ambulatory under his own power to home.  V/S stable.  He was not showing any signs of distress on DC 
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
## 2013-01-31 NOTE — ED Provider Notes (Signed)
Medical screening examination/treatment/procedure(s) were performed by non-physician practitioner and as supervising physician I was immediately available for consultation/collaboration.    Taetum Flewellen R Alyda Megna, MD 01/31/13 0754 

## 2013-02-04 ENCOUNTER — Encounter (HOSPITAL_COMMUNITY): Payer: Self-pay | Admitting: Emergency Medicine

## 2013-02-04 ENCOUNTER — Emergency Department (HOSPITAL_COMMUNITY)
Admission: EM | Admit: 2013-02-04 | Discharge: 2013-02-04 | Disposition: A | Discharge status: Home / Self Care | Payer: Medicaid Other | Attending: Emergency Medicine | Admitting: Emergency Medicine

## 2013-02-04 DIAGNOSIS — F329 Major depressive disorder, single episode, unspecified: Secondary | ICD-10-CM | POA: Insufficient documentation

## 2013-02-04 DIAGNOSIS — Z8619 Personal history of other infectious and parasitic diseases: Secondary | ICD-10-CM | POA: Insufficient documentation

## 2013-02-04 DIAGNOSIS — G8929 Other chronic pain: Secondary | ICD-10-CM | POA: Insufficient documentation

## 2013-02-04 DIAGNOSIS — G20 Parkinson's disease: Secondary | ICD-10-CM | POA: Insufficient documentation

## 2013-02-04 DIAGNOSIS — Z8739 Personal history of other diseases of the musculoskeletal system and connective tissue: Secondary | ICD-10-CM | POA: Insufficient documentation

## 2013-02-04 DIAGNOSIS — M549 Dorsalgia, unspecified: Secondary | ICD-10-CM

## 2013-02-04 DIAGNOSIS — F3289 Other specified depressive episodes: Secondary | ICD-10-CM | POA: Insufficient documentation

## 2013-02-04 DIAGNOSIS — Z8659 Personal history of other mental and behavioral disorders: Secondary | ICD-10-CM | POA: Insufficient documentation

## 2013-02-04 DIAGNOSIS — F1410 Cocaine abuse, uncomplicated: Secondary | ICD-10-CM | POA: Insufficient documentation

## 2013-02-04 DIAGNOSIS — I10 Essential (primary) hypertension: Secondary | ICD-10-CM | POA: Insufficient documentation

## 2013-02-04 DIAGNOSIS — Z79899 Other long term (current) drug therapy: Secondary | ICD-10-CM | POA: Insufficient documentation

## 2013-02-04 DIAGNOSIS — G20A1 Parkinson's disease without dyskinesia, without mention of fluctuations: Secondary | ICD-10-CM | POA: Insufficient documentation

## 2013-02-04 DIAGNOSIS — F29 Unspecified psychosis not due to a substance or known physiological condition: Secondary | ICD-10-CM | POA: Insufficient documentation

## 2013-02-04 DIAGNOSIS — F17200 Nicotine dependence, unspecified, uncomplicated: Secondary | ICD-10-CM | POA: Insufficient documentation

## 2013-02-04 DIAGNOSIS — M79609 Pain in unspecified limb: Secondary | ICD-10-CM | POA: Insufficient documentation

## 2013-02-04 MED ORDER — KETOROLAC TROMETHAMINE 60 MG/2ML IM SOLN
60.0000 mg | Freq: Once | INTRAMUSCULAR | Status: AC
  Administered 2013-02-04: 60 mg via INTRAMUSCULAR
  Filled 2013-02-04: qty 2

## 2013-02-04 MED ORDER — NAPROXEN 500 MG PO TABS: 500.0000 mg | ORAL_TABLET | Freq: Two times a day (BID) | ORAL | Status: DC
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
## 2013-02-04 NOTE — ED Notes (Signed)
Pt w/ cup at hand sanitizer filling cup w/ sanitizer, drink full cup before staff could stop him. Dr. Hyacinth Meeker notified

## 2013-02-04 NOTE — ED Notes (Signed)
Physician planning discharge. Spoke w/ Marchelle Folks in Social Work regarding available living resources who indicates that pt has been given these resources numerous times d/t the number of visits to the ED in the past 6 months.

## 2013-02-04 NOTE — ED Notes (Signed)
Discharge instructions reviewed w/ pt., verbalizes understanding. One prescription provided. Talking w/ [facility] about where he can go upon discharge.

## 2013-02-04 NOTE — ED Notes (Signed)
Pt lying on right side snoring, did not awaken when Toradol administered
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
## 2013-02-04 NOTE — ED Notes (Signed)
[id number]> Expected date:<BR> Expected time:<BR> Means of arrival:<BR> Comments:<BR> 57 y/o M back pain/ETOH
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
## 2013-02-07 ENCOUNTER — Encounter (HOSPITAL_COMMUNITY): Payer: Self-pay | Admitting: Emergency Medicine

## 2013-02-07 ENCOUNTER — Emergency Department (HOSPITAL_COMMUNITY)
Admission: EM | Admit: 2013-02-07 | Discharge: 2013-02-07 | Disposition: A | Discharge status: Home / Self Care | Payer: Medicaid Other | Source: Home / Self Care | Attending: Emergency Medicine | Admitting: Emergency Medicine

## 2013-02-07 DIAGNOSIS — Z5900 Homelessness unspecified: Secondary | ICD-10-CM | POA: Insufficient documentation

## 2013-02-07 DIAGNOSIS — F1010 Alcohol abuse, uncomplicated: Secondary | ICD-10-CM | POA: Insufficient documentation

## 2013-02-07 DIAGNOSIS — G894 Chronic pain syndrome: Secondary | ICD-10-CM | POA: Insufficient documentation

## 2013-02-07 DIAGNOSIS — I10 Essential (primary) hypertension: Secondary | ICD-10-CM | POA: Insufficient documentation

## 2013-02-07 DIAGNOSIS — Z8739 Personal history of other diseases of the musculoskeletal system and connective tissue: Secondary | ICD-10-CM | POA: Insufficient documentation

## 2013-02-07 DIAGNOSIS — F10220 Alcohol dependence with intoxication, uncomplicated: Secondary | ICD-10-CM

## 2013-02-07 DIAGNOSIS — G20A1 Parkinson's disease without dyskinesia, without mention of fluctuations: Secondary | ICD-10-CM | POA: Insufficient documentation

## 2013-02-07 DIAGNOSIS — F329 Major depressive disorder, single episode, unspecified: Secondary | ICD-10-CM | POA: Insufficient documentation

## 2013-02-07 DIAGNOSIS — Z8619 Personal history of other infectious and parasitic diseases: Secondary | ICD-10-CM | POA: Insufficient documentation

## 2013-02-07 DIAGNOSIS — Z79899 Other long term (current) drug therapy: Secondary | ICD-10-CM | POA: Insufficient documentation

## 2013-02-07 DIAGNOSIS — G8929 Other chronic pain: Secondary | ICD-10-CM | POA: Insufficient documentation

## 2013-02-07 DIAGNOSIS — F17200 Nicotine dependence, unspecified, uncomplicated: Secondary | ICD-10-CM | POA: Insufficient documentation

## 2013-02-07 DIAGNOSIS — G20 Parkinson's disease: Secondary | ICD-10-CM | POA: Insufficient documentation

## 2013-02-07 DIAGNOSIS — Z8659 Personal history of other mental and behavioral disorders: Secondary | ICD-10-CM | POA: Insufficient documentation

## 2013-02-07 DIAGNOSIS — M549 Dorsalgia, unspecified: Secondary | ICD-10-CM | POA: Insufficient documentation

## 2013-02-07 DIAGNOSIS — F3289 Other specified depressive episodes: Secondary | ICD-10-CM | POA: Insufficient documentation

## 2013-02-07 LAB — RAPID URINE DRUG SCREEN, HOSP PERFORMED
Barbiturates: NOT DETECTED
Cocaine: POSITIVE — AB
Tetrahydrocannabinol: NOT DETECTED

## 2013-02-07 LAB — COMPREHENSIVE METABOLIC PANEL
ALT: 15 U/L (ref 0–53)
AST: 36 U/L (ref 0–37)
Alkaline Phosphatase: 130 U/L — ABNORMAL HIGH (ref 39–117)
CO2: 24 mEq/L (ref 19–32)
Calcium: 8.3 mg/dL — ABNORMAL LOW (ref 8.4–10.5)
Chloride: 104 mEq/L (ref 96–112)
GFR calc Af Amer: >90 mL/min (ref >90)
GFR calc non Af Amer: >90 mL/min (ref >90)
Glucose, Bld: 99 mg/dL (ref 70–99)
Potassium: 2.9 mEq/L — ABNORMAL LOW (ref 3.5–5.1)
Sodium: 142 mEq/L (ref 135–145)

## 2013-02-07 LAB — CBC WITH DIFFERENTIAL/PLATELET
Basophils Absolute: 0 10*3/uL (ref 0.0–0.1)
Lymphocytes Relative: 36 % (ref 12–46)
Lymphs Abs: 1.9 10*3/uL (ref 0.7–4.0)
MCV: 85 fL (ref 78.0–100.0)
Neutro Abs: 2.7 10*3/uL (ref 1.7–7.7)
Neutrophils Relative %: 51 % (ref 43–77)
Platelets: 152 10*3/uL (ref 150–400)
RBC: 4.73 MIL/uL (ref 4.22–5.81)
RDW: 15.5 % (ref 11.5–15.5)
WBC: 5.3 10*3/uL (ref 4.0–10.5)

## 2013-02-07 MED ORDER — NICOTINE 14 MG/24HR TD PT24
14.0000 mg | MEDICATED_PATCH | Freq: Once | TRANSDERMAL | Status: DC
  Administered 2013-02-07: 14 mg via TRANSDERMAL
  Filled 2013-02-07: qty 1

## 2013-02-07 MED ORDER — POTASSIUM CHLORIDE CRYS ER 20 MEQ PO TBCR
40.0000 meq | EXTENDED_RELEASE_TABLET | Freq: Once | ORAL | Status: AC
  Administered 2013-02-07: 40 meq via ORAL
  Filled 2013-02-07: qty 2

## 2013-02-07 MED ORDER — VITAMIN B-1 100 MG PO TABS
100.0000 mg | ORAL_TABLET | Freq: Once | ORAL | Status: AC
  Administered 2013-02-07: 100 mg via ORAL
  Filled 2013-02-07: qty 1

## 2013-02-07 MED ORDER — THIAMINE HCL 100 MG/ML IJ SOLN: 100.0000 mg | Freq: Once | INTRAMUSCULAR | Status: DC

## 2013-02-07 NOTE — ED Provider Notes (Signed)
History    CSN: [id number] Arrival date & time 02/07/13  1632 First MD Initiated Contact with Patient 02/07/13 1634      Chief Complaint  Patient presents with  . Medical Clearance    HPI Patient presents to the emergency room stating he wants detox.  Patient has history of chronic back pain. He has history of degenerative disc disease and is wheelchair-bound as well as homeless. Patient has been followed at the family practice clinic. Their notes in the chart indicating that they will no longer refill his narcotic pain medications. Patient states he has been drinking alcohol in order to cope with his pain. He states he wants detox. The patient has been seen multiple times in the past for the same complaints.  Patient denies any abdominal pain, vomiting or fever.  Past Medical History  Diagnosis Date  . DDD (degenerative disc disease)   . Necrotizing fasciitis   . Degenerative disc disease   . Hypertension   . Degenerative disc disease   . Hepatitis C   . Chronic pain   . DDD (degenerative disc disease)   . Parkinson disease   . Mental disorder   . Depression   . DDD (degenerative disc disease)   . DDD (degenerative disc disease), lumbar     Past Surgical History  Procedure Laterality Date  . Hip surgery    . Appendectomy    . Ankle surgery    . Incision and drainage of wound N/A 11/26/2012    Procedure: IRRIGATION AND DEBRIDEMENT WOUND;  Surgeon: Emelia Loron, MD;  Location: [facility] OR;  Service: General;  Laterality: N/A;  . Wound exploration N/A 11/26/2012    Procedure: WOUND EXPLORATION;  Surgeon: Emelia Loron, MD;  Location: [facility] OR;  Service: General;  Laterality: N/A;    No family history on file.  History  Substance Use Topics  . Smoking status: Current Every Day Smoker -- 1.00 packs/day    Types: Cigarettes    Last Attempt to Quit: 11/27/2012  . Smokeless tobacco: Current User    Types: Chew  . Alcohol Use: Not on file     Comment: anything he can get his  hands on      Review of Systems  All other systems reviewed and are negative.    Allergies  Review of patient's allergies indicates no known allergies.  Home Medications   Current Outpatient Rx  Name  Route  Sig  Dispense  Refill  . gabapentin (NEURONTIN) 600 MG tablet   Oral   Take 600 mg by mouth 3 (three) times daily.         . metoprolol succinate (TOPROL-XL) 25 MG 24 hr tablet   Oral   Take 25 mg by mouth daily.         Marland Kitchen oxyCODONE-acetaminophen (PERCOCET/ROXICET) 5-325 MG per tablet   Oral   Take 1 tablet by mouth every 4 (four) hours as needed for pain.         . traMADol (ULTRAM) 50 MG tablet   Oral   Take 50 mg by mouth 2 (two) times daily as needed for pain.            BP 151/99  Pulse 106  Temp(Src) 98 F (36.7 C) (Oral)  Resp 18  SpO2 97%  Physical Exam  Nursing note and vitals reviewed. Constitutional: No distress.  Disheveled, malodorous  HENT:  Head: Normocephalic and atraumatic.  Right Ear: External ear normal.  Left Ear: External ear normal.  Eyes: Conjunctivae are normal. Right eye exhibits no discharge. Left eye exhibits no discharge. No scleral icterus.  Neck: Neck supple. No tracheal deviation present.  Cardiovascular: Normal rate, regular rhythm and intact distal pulses.   Pulmonary/Chest: Effort normal and breath sounds normal. No stridor. No respiratory distress. He has no wheezes. He has no rales.  Abdominal: Soft. Bowel sounds are normal. He exhibits no distension. There is no tenderness. There is no rebound and no guarding.  Musculoskeletal: He exhibits no edema and no tenderness.  Neurological: He is alert. He displays atrophy. He displays no tremor. No sensory deficit. Cranial nerve deficit:  no gross defecits noted. He exhibits normal muscle tone. He displays no seizure activity. Coordination normal.  Weakness bilateral lower extremities, patient does have movement  Skin: Skin is warm and dry. No rash noted.   Psychiatric: He has a normal mood and affect.    ED Course  Procedures (including critical care time)  Labs Reviewed  COMPREHENSIVE METABOLIC PANEL - Abnormal; Notable for the following:    Potassium 2.9 (*)    Calcium 8.3 (*)    Albumin 3.1 (*)    Alkaline Phosphatase 130 (*)    Total Bilirubin 0.2 (*)    All other components within normal limits  ETHANOL - Abnormal; Notable for the following:    Alcohol, Ethyl (B) 307 (*)    All other components within normal limits  URINE RAPID DRUG SCREEN (HOSP PERFORMED) - Abnormal; Notable for the following:    Cocaine POSITIVE (*)    All other components within normal limits  CBC WITH DIFFERENTIAL   No results found.   1. Alcohol intoxication in episodic drinker, uncomplicated   2. Chronic pain syndrome       MDM  Old records have been reviewed. Patient has had MRI imaging of his spine. There is no medical reason for him to not ambulate. Patient continues to use a wheelchair however.  Patient was monitored in the emergency department. He was allowed to sober up. The patient has previously been evaluated by the psychiatry team. He has not met inpatient criteria. Patient has been given referrals to outpatient detox as well. I provide that once again to him today.  Patient has been seen numerous times in the emergency department for similar complaints. There appears to be a lot of secondary gain for his visits to the emergency department. I do not feel the patient is actively suicidal or homicidal.       Celene Kras, MD 02/07/13 [phone]

## 2013-02-07 NOTE — ED Notes (Addendum)
Responded to room after hearing patient yelling for assistance. Pt found in floor beside bed. Upon leaving the room last, the side rails were up. After asking the patient what happened, he states that when he was reaching for his sheet, the side rail went down and he fell in the floor. The patient is in no distress and there are no signs of trauma or injury. Vitals are stable. Pt was previously a moderate fall risk and was in room five, close to the nurses station, had red socks and a yellow arm band in place. Obtained additional staff to place patient back in bed and charge nurse notified. Prior to fall patient was informed of impending discharge. Fall huddle completed and patient re-educated on fall risk and methods of maintaining safety while in the department. Side rails were checked and there are no signs of malfunction. Dr. Lynelle Doctor notified of fall, no new orders.

## 2013-02-07 NOTE — ED Notes (Signed)
Per EMS: Pt requesting detox. Pt reports using mouthwash. Pt also states he has lower back pain.

## 2013-02-07 NOTE — ED Notes (Signed)
Pt. Belongings are under the desk near room 5

## 2013-02-08 ENCOUNTER — Encounter (HOSPITAL_COMMUNITY): Payer: Self-pay | Admitting: Emergency Medicine

## 2013-02-08 ENCOUNTER — Emergency Department (HOSPITAL_COMMUNITY)
Admission: EM | Admit: 2013-02-08 | Discharge: 2013-02-09 | Disposition: A | Discharge status: Other Acute Inpatient Hospital | Payer: Medicaid Other | Attending: Emergency Medicine | Admitting: Emergency Medicine

## 2013-02-08 ENCOUNTER — Telehealth: Payer: Self-pay | Admitting: Family Medicine

## 2013-02-08 DIAGNOSIS — G8929 Other chronic pain: Secondary | ICD-10-CM | POA: Insufficient documentation

## 2013-02-08 DIAGNOSIS — Z79899 Other long term (current) drug therapy: Secondary | ICD-10-CM | POA: Insufficient documentation

## 2013-02-08 DIAGNOSIS — F329 Major depressive disorder, single episode, unspecified: Secondary | ICD-10-CM | POA: Diagnosis present

## 2013-02-08 DIAGNOSIS — F17200 Nicotine dependence, unspecified, uncomplicated: Secondary | ICD-10-CM | POA: Insufficient documentation

## 2013-02-08 DIAGNOSIS — F1020 Alcohol dependence, uncomplicated: Secondary | ICD-10-CM | POA: Insufficient documentation

## 2013-02-08 DIAGNOSIS — M545 Low back pain: Secondary | ICD-10-CM | POA: Diagnosis present

## 2013-02-08 DIAGNOSIS — Z720 Tobacco use: Secondary | ICD-10-CM | POA: Diagnosis present

## 2013-02-08 DIAGNOSIS — Z590 Homelessness: Secondary | ICD-10-CM

## 2013-02-08 DIAGNOSIS — F1010 Alcohol abuse, uncomplicated: Secondary | ICD-10-CM

## 2013-02-08 DIAGNOSIS — Z8619 Personal history of other infectious and parasitic diseases: Secondary | ICD-10-CM | POA: Insufficient documentation

## 2013-02-08 DIAGNOSIS — I10 Essential (primary) hypertension: Secondary | ICD-10-CM | POA: Insufficient documentation

## 2013-02-08 DIAGNOSIS — F3289 Other specified depressive episodes: Secondary | ICD-10-CM | POA: Insufficient documentation

## 2013-02-08 DIAGNOSIS — F319 Bipolar disorder, unspecified: Secondary | ICD-10-CM

## 2013-02-08 DIAGNOSIS — Z4800 Encounter for change or removal of nonsurgical wound dressing: Secondary | ICD-10-CM | POA: Insufficient documentation

## 2013-02-08 DIAGNOSIS — G20A1 Parkinson's disease without dyskinesia, without mention of fluctuations: Secondary | ICD-10-CM | POA: Insufficient documentation

## 2013-02-08 DIAGNOSIS — G20 Parkinson's disease: Secondary | ICD-10-CM | POA: Insufficient documentation

## 2013-02-08 DIAGNOSIS — M5410 Radiculopathy, site unspecified: Secondary | ICD-10-CM

## 2013-02-08 DIAGNOSIS — M5137 Other intervertebral disc degeneration, lumbosacral region: Secondary | ICD-10-CM | POA: Insufficient documentation

## 2013-02-08 DIAGNOSIS — R45851 Suicidal ideations: Secondary | ICD-10-CM

## 2013-02-08 DIAGNOSIS — IMO0001 Reserved for inherently not codable concepts without codable children: Secondary | ICD-10-CM | POA: Insufficient documentation

## 2013-02-08 DIAGNOSIS — M51379 Other intervertebral disc degeneration, lumbosacral region without mention of lumbar back pain or lower extremity pain: Secondary | ICD-10-CM | POA: Insufficient documentation

## 2013-02-08 LAB — COMPREHENSIVE METABOLIC PANEL
ALT: 16 U/L (ref 0–53)
AST: 36 U/L (ref 0–37)
Alkaline Phosphatase: 117 U/L (ref 39–117)
CO2: 23 mEq/L (ref 19–32)
Calcium: 8.4 mg/dL (ref 8.4–10.5)
Potassium: 3 mEq/L — ABNORMAL LOW (ref 3.5–5.1)
Sodium: 143 mEq/L (ref 135–145)
Total Protein: 6.6 g/dL (ref 6.0–8.3)

## 2013-02-08 LAB — ACETAMINOPHEN LEVEL: Acetaminophen (Tylenol), Serum: <15 ug/mL (ref 10–30)

## 2013-02-08 LAB — RAPID URINE DRUG SCREEN, HOSP PERFORMED
Benzodiazepines: NOT DETECTED
Cocaine: NOT DETECTED
Opiates: NOT DETECTED

## 2013-02-08 LAB — CBC
Hemoglobin: 13.5 g/dL (ref 13.0–17.0)
MCH: 27.8 pg (ref 26.0–34.0)
MCHC: 32.6 g/dL (ref 30.0–36.0)
Platelets: 132 10*3/uL — ABNORMAL LOW (ref 150–400)
RBC: 4.85 MIL/uL (ref 4.22–5.81)

## 2013-02-08 MED ORDER — IBUPROFEN 800 MG PO TABS
800.0000 mg | ORAL_TABLET | Freq: Once | ORAL | Status: AC
  Administered 2013-02-08: 800 mg via ORAL
  Filled 2013-02-08: qty 1

## 2013-02-08 MED ORDER — ACETAMINOPHEN 500 MG PO TABS
1000.0000 mg | ORAL_TABLET | Freq: Once | ORAL | Status: AC
  Administered 2013-02-08: 1000 mg via ORAL
  Filled 2013-02-08: qty 2

## 2013-02-08 MED ORDER — POTASSIUM CHLORIDE CRYS ER 20 MEQ PO TBCR
40.0000 meq | EXTENDED_RELEASE_TABLET | Freq: Once | ORAL | Status: AC
  Administered 2013-02-08: 40 meq via ORAL
  Filled 2013-02-08: qty 2

## 2013-02-08 MED ORDER — LORAZEPAM 1 MG PO TABS
2.0000 mg | ORAL_TABLET | Freq: Once | ORAL | Status: AC
  Administered 2013-02-08: 2 mg via ORAL
  Filled 2013-02-08: qty 2

## 2013-02-08 NOTE — ED Notes (Signed)
One bag of personal belongings returned to patient. Pt given a change of blue scrubs for discharge.
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
## 2013-02-08 NOTE — BH Assessment (Addendum)
Assessment Note Patient is a 57 year old white male that uses a wheel chair.  Patient is not orientated to date and time. During the assessment the patient had to be redirected several times due to his inability to remain focus. Patient reports feelings of depression associated with chronic pain in his lower back due to lower back pain.     Patient reports that he has a plan to kill himself by taking an overdose of drugs.   Patient reports that he no longer wants to live due to so much pain in his back. Patient reports that he has attempted to kill himself in the past.  Patient was not able to state the name of the psychiatric hospital that he was admitted.    Patient reports a past history of mental illness.  Patient reports that he is working with an ACT Team but he does not remember anyone's name on the Team or the name of the agency.  Patient reports that he has not been given his medication due to, "his psychiatrist going on vacation and forgetting about him".    Patient BAL is 297.  Patient reports that he drinks every day. Patient reports that he drinks a least 21/2 liters of liquor daily.  Patient reports that his intake of alcohol has increased in the past 6 months.  Patient reports that he has not been to a rehab center.  Patient reports that his last drink was a fifth of vodka, after which he had a half gallon of Listerine and then passed out in his wheel chair behind a shopping center.  Patient denies problem with substance abuse initially.   Patient probed more about his past alcohol use and he reports that he might need assistance with his alcoholism however, he has not been able to obtain assistance.  Patient denies use of any other drugs.  Patients UDS was negative  Patient denies HI.  Patient denies psychosis.      Axis I: Bipolar, Depressed and Alcohol Dependence  Axis II: Deferred Axis III:  Past Medical History  Diagnosis Date  . DDD (degenerative disc disease)   . Necrotizing  fasciitis   . Degenerative disc disease   . Hypertension   . Degenerative disc disease   . Hepatitis C   . Chronic pain   . DDD (degenerative disc disease)   . Parkinson disease   . Mental disorder   . Depression   . DDD (degenerative disc disease)   . DDD (degenerative disc disease), lumbar    Axis IV: economic problems, educational problems, housing problems, occupational problems, other psychosocial or environmental problems, problems related to legal system/crime, problems related to social environment, problems with access to health care services and problems with primary support group Axis V: 21-30 behavior considerably influenced by delusions or hallucinations OR serious impairment in judgment, communication OR inability to function in almost all areas  Past Medical History:  Past Medical History  Diagnosis Date  . DDD (degenerative disc disease)   . Necrotizing fasciitis   . Degenerative disc disease   . Hypertension   . Degenerative disc disease   . Hepatitis C   . Chronic pain   . DDD (degenerative disc disease)   . Parkinson disease   . Mental disorder   . Depression   . DDD (degenerative disc disease)   . DDD (degenerative disc disease), lumbar     Past Surgical History  Procedure Laterality Date  . Hip surgery    .  Appendectomy    . Ankle surgery    . Incision and drainage of wound N/A 11/26/2012    Procedure: IRRIGATION AND DEBRIDEMENT WOUND;  Surgeon: Emelia Loron, MD;  Location: [facility] OR;  Service: General;  Laterality: N/A;  . Wound exploration N/A 11/26/2012    Procedure: WOUND EXPLORATION;  Surgeon: Emelia Loron, MD;  Location: [facility] OR;  Service: General;  Laterality: N/A;    Family History: No family history on file.  Social History:  reports that he has been smoking Cigarettes.  He has been smoking about 1.00 pack per day. His smokeless tobacco use includes Chew. He reports that he drinks about 1.5 ounces of alcohol per week. He reports that he  uses illicit drugs (Marijuana, Cocaine, and Other-see comments).  Additional Social History:     CIWA: CIWA-Ar BP: 141/118 mmHg Pulse Rate: 98 COWS:    Allergies: No Known Allergies  Home Medications:  (Not in a hospital admission)  OB/GYN Status:  No LMP for male patient.  General Assessment Data Location of Assessment: [facility] ED ACT Assessment: Yes Living Arrangements: Other (Comment) Can pt return to current living arrangement?: Yes Admission Status: Voluntary Is patient capable of signing voluntary admission?: Yes Transfer from: Acute Hospital Referral Source: Other     Risk to self Suicidal Ideation: Yes-Currently Present Suicidal Intent: Yes-Currently Present Is patient at risk for suicide?: Yes Suicidal Plan?: Yes-Currently Present Specify Current Suicidal Plan: taking an overdose of drugs. Access to Means: No What has been your use of drugs/alcohol within the last 12 months?: alcohol  Previous Attempts/Gestures: Yes How many times?: 1 Other Self Harm Risks: homeless Triggers for Past Attempts: Family contact Intentional Self Injurious Behavior: None Family Suicide History: Unknown Recent stressful life event(s): Trauma (Comment) Persecutory voices/beliefs?: No Depression: Yes Depression Symptoms: Despondent Substance abuse history and/or treatment for substance abuse?: No Suicide prevention information given to non-admitted patients: Yes  Risk to Others Homicidal Ideation: No Thoughts of Harm to Others: No Comment - Thoughts of Harm to Others: None  Current Homicidal Intent: No Current Homicidal Plan: No Access to Homicidal Means: No Identified Victim: None  History of harm to others?: No Assessment of Violence: None Noted Violent Behavior Description: None Does patient have access to weapons?: No Criminal Charges Pending?: No Describe Pending Criminal Charges: None  Does patient have a court date: No  Psychosis Hallucinations: None  noted Delusions: None noted  Mental Status Report Appear/Hygiene: Bizarre;Body odor;Layered clothes;Poor hygiene Eye Contact: Poor Motor Activity: Unsteady Speech: Soft;Slow Level of Consciousness: Alert Mood: Depressed;Anxious Affect: Irritable Anxiety Level: None Thought Processes: Relevant Judgement: Impaired Orientation: Person;Place;Situation Obsessive Compulsive Thoughts/Behaviors: None  Cognitive Functioning Concentration: Decreased Memory: Recent Impaired;Remote Impaired IQ: Average Insight: Poor Impulse Control: Poor Appetite: Poor Weight Loss: 0 Weight Gain: 0 Sleep: Decreased Total Hours of Sleep: 3 Vegetative Symptoms: None  ADLScreening [facility] Assessment Services) Patient's cognitive ability adequate to safely complete daily activities?: Yes Patient able to express need for assistance with ADLs?: Yes Independently performs ADLs?: No  Abuse/Neglect [facility]) Physical Abuse: Denies Verbal Abuse: Denies Sexual Abuse: Denies  Prior Inpatient Therapy Prior Inpatient Therapy: Yes Prior Therapy Dates: 04/2012 Prior Therapy Facilty/Provider(s): [facility] Reason for Treatment: SI  Prior Outpatient Therapy Prior Outpatient Therapy: Yes Prior Therapy Dates: unknown  Prior Therapy Facilty/Provider(s): unknown  Reason for Treatment: unknown   ADL Screening (condition at time of admission) Patient's cognitive ability adequate to safely complete daily activities?: Yes Patient able to express need for assistance with ADLs?: Yes Independently performs ADLs?:  No       Abuse/Neglect Assessment (Assessment to be complete while patient is alone) Physical Abuse: Denies Verbal Abuse: Denies Sexual Abuse: Denies Values / Beliefs Cultural Requests During Hospitalization: None Spiritual Requests During Hospitalization: None     Nutrition Screen- [facility] Adult/[facility] Patient's home diet:  (pt. given water.)  Additional Information 1:1 In Past 12 Months?: Yes CIRT Risk:  No Elopement Risk: No Does patient have medical clearance?: Yes     Disposition: Pending [facility].   Disposition Initial Assessment Completed for this Encounter: Yes Disposition of Patient: Referred to Other disposition(s): Other (Comment) Patient referred to: Other (Comment)  On Site Evaluation by:   Reviewed with Physician:     Phillip Heal LaVerne 02/08/2013 11:23 PM

## 2013-02-08 NOTE — Telephone Encounter (Signed)
There was an undeliverable certified letter that was returned to the office for this patient today.  Jeffrey [name] was made aware.

## 2013-02-08 NOTE — ED Notes (Signed)
Called ACT team to help coordinate pt's transfer to [facility].
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
## 2013-02-08 NOTE — BHH Counselor (Signed)
Writer consulted with Dr. Adriana Simas.  Writer informed Dr. Adriana Simas that the patient will be pending placement at [facility].
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
## 2013-02-08 NOTE — ED Notes (Addendum)
Dr. Lynelle Doctor went to room to assess patient and discuss discharge with the patient. Dr. Lynelle Doctor informed me upon leaving the patient's room that after being told he was being discharged, the patient stated "you know I'm suicidal right?" Dr. Lynelle Doctor stated he planned to discharge the patient and wanted to let me know this information so that I knew he (Dr. Lynelle Doctor) was aware of the patients statements of SI.

## 2013-02-09 ENCOUNTER — Inpatient Hospital Stay (HOSPITAL_COMMUNITY)
Admission: AD | Admit: 2013-02-09 | Discharge: 2013-02-26 | DRG: 885 | Disposition: A | Discharge status: Home / Self Care | Payer: Medicaid Other | Source: Intra-hospital | Attending: Psychiatry | Admitting: Psychiatry

## 2013-02-09 DIAGNOSIS — Z590 Homelessness: Secondary | ICD-10-CM

## 2013-02-09 DIAGNOSIS — G20A1 Parkinson's disease without dyskinesia, without mention of fluctuations: Secondary | ICD-10-CM | POA: Diagnosis present

## 2013-02-09 DIAGNOSIS — G20 Parkinson's disease: Secondary | ICD-10-CM | POA: Diagnosis present

## 2013-02-09 DIAGNOSIS — M62838 Other muscle spasm: Secondary | ICD-10-CM

## 2013-02-09 DIAGNOSIS — F322 Major depressive disorder, single episode, severe without psychotic features: Principal | ICD-10-CM

## 2013-02-09 DIAGNOSIS — M545 Low back pain, unspecified: Secondary | ICD-10-CM | POA: Diagnosis present

## 2013-02-09 DIAGNOSIS — F1190 Opioid use, unspecified, uncomplicated: Secondary | ICD-10-CM | POA: Diagnosis present

## 2013-02-09 DIAGNOSIS — I10 Essential (primary) hypertension: Secondary | ICD-10-CM | POA: Diagnosis present

## 2013-02-09 DIAGNOSIS — D638 Anemia in other chronic diseases classified elsewhere: Secondary | ICD-10-CM

## 2013-02-09 DIAGNOSIS — F329 Major depressive disorder, single episode, unspecified: Secondary | ICD-10-CM | POA: Diagnosis present

## 2013-02-09 DIAGNOSIS — F341 Dysthymic disorder: Secondary | ICD-10-CM

## 2013-02-09 DIAGNOSIS — Z720 Tobacco use: Secondary | ICD-10-CM

## 2013-02-09 DIAGNOSIS — F1010 Alcohol abuse, uncomplicated: Secondary | ICD-10-CM

## 2013-02-09 DIAGNOSIS — F319 Bipolar disorder, unspecified: Secondary | ICD-10-CM

## 2013-02-09 DIAGNOSIS — M5136 Other intervertebral disc degeneration, lumbar region: Secondary | ICD-10-CM

## 2013-02-09 DIAGNOSIS — G8929 Other chronic pain: Secondary | ICD-10-CM

## 2013-02-09 DIAGNOSIS — IMO0002 Reserved for concepts with insufficient information to code with codable children: Secondary | ICD-10-CM

## 2013-02-09 DIAGNOSIS — M79609 Pain in unspecified limb: Secondary | ICD-10-CM

## 2013-02-09 DIAGNOSIS — K7030 Alcoholic cirrhosis of liver without ascites: Secondary | ICD-10-CM

## 2013-02-09 DIAGNOSIS — R45851 Suicidal ideations: Secondary | ICD-10-CM

## 2013-02-09 DIAGNOSIS — M5410 Radiculopathy, site unspecified: Secondary | ICD-10-CM

## 2013-02-09 DIAGNOSIS — F1020 Alcohol dependence, uncomplicated: Secondary | ICD-10-CM | POA: Diagnosis present

## 2013-02-09 DIAGNOSIS — Z79899 Other long term (current) drug therapy: Secondary | ICD-10-CM

## 2013-02-09 DIAGNOSIS — G4700 Insomnia, unspecified: Secondary | ICD-10-CM

## 2013-02-09 MED ORDER — OXYCODONE-ACETAMINOPHEN 5-325 MG PO TABS
1.0000 | ORAL_TABLET | Freq: Once | ORAL | Status: AC
  Administered 2013-02-09: 1 via ORAL
  Filled 2013-02-09: qty 1

## 2013-02-09 MED ORDER — LORAZEPAM 1 MG PO TABS: 0.0000 mg | ORAL_TABLET | Freq: Two times a day (BID) | ORAL | Status: DC

## 2013-02-09 MED ORDER — LORAZEPAM 1 MG PO TABS: 0.0000 mg | ORAL_TABLET | Freq: Four times a day (QID) | ORAL | Status: DC

## 2013-02-09 MED ORDER — ACETAMINOPHEN 325 MG PO TABS
650.0000 mg | ORAL_TABLET | Freq: Four times a day (QID) | ORAL | Status: DC | PRN
  Administered 2013-02-15 – 2013-02-19 (×5): 650 mg via ORAL

## 2013-02-09 MED ORDER — LORAZEPAM 1 MG PO TABS
0.0000 mg | ORAL_TABLET | Freq: Four times a day (QID) | ORAL | Status: AC
  Administered 2013-02-10: 4 mg via ORAL
  Administered 2013-02-10 – 2013-02-11 (×4): 2 mg via ORAL
  Administered 2013-02-11: 4 mg via ORAL
  Administered 2013-02-11 (×2): 2 mg via ORAL
  Filled 2013-02-09: qty 4
  Filled 2013-02-09 (×2): qty 2
  Filled 2013-02-09: qty 4
  Filled 2013-02-09 (×3): qty 2

## 2013-02-09 MED ORDER — VITAMIN B-1 100 MG PO TABS: 100.0000 mg | ORAL_TABLET | Freq: Every day | ORAL | Status: DC

## 2013-02-09 MED ORDER — FOLIC ACID 1 MG PO TABS
1.0000 mg | ORAL_TABLET | Freq: Every day | ORAL | Status: DC
  Administered 2013-02-09: 1 mg via ORAL
  Filled 2013-02-09: qty 1

## 2013-02-09 MED ORDER — DIVALPROEX SODIUM 250 MG PO DR TAB
250.0000 mg | DELAYED_RELEASE_TABLET | Freq: Two times a day (BID) | ORAL | Status: DC
  Administered 2013-02-10 – 2013-02-15 (×12): 250 mg via ORAL
  Filled 2013-02-09 (×17): qty 1

## 2013-02-09 MED ORDER — NICOTINE 21 MG/24HR TD PT24
21.0000 mg | MEDICATED_PATCH | Freq: Every day | TRANSDERMAL | Status: DC
  Administered 2013-02-09: 21 mg via TRANSDERMAL
  Filled 2013-02-09: qty 1

## 2013-02-09 MED ORDER — VITAMIN B-1 100 MG PO TABS
100.0000 mg | ORAL_TABLET | Freq: Every day | ORAL | Status: DC
  Filled 2013-02-09 (×2): qty 1

## 2013-02-09 MED ORDER — OXYCODONE HCL 5 MG PO TABS
5.0000 mg | ORAL_TABLET | ORAL | Status: DC | PRN
  Administered 2013-02-10 – 2013-02-13 (×17): 5 mg via ORAL
  Filled 2013-02-09 (×19): qty 1

## 2013-02-09 MED ORDER — MAGNESIUM HYDROXIDE 400 MG/5ML PO SUSP: 30.0000 mL | Freq: Every day | ORAL | Status: DC | PRN

## 2013-02-09 MED ORDER — ALUM & MAG HYDROXIDE-SIMETH 200-200-20 MG/5ML PO SUSP: 30.0000 mL | ORAL | Status: DC | PRN

## 2013-02-09 MED ORDER — FOLIC ACID 1 MG PO TABS
1.0000 mg | ORAL_TABLET | Freq: Every day | ORAL | Status: DC
  Administered 2013-02-10 – 2013-02-26 (×17): 1 mg via ORAL
  Filled 2013-02-09 (×19): qty 1

## 2013-02-09 MED ORDER — LORAZEPAM 1 MG PO TABS
1.0000 mg | ORAL_TABLET | Freq: Four times a day (QID) | ORAL | Status: DC | PRN
  Administered 2013-02-09: 1 mg via ORAL
  Filled 2013-02-09: qty 1

## 2013-02-09 MED ORDER — THIAMINE HCL 100 MG/ML IJ SOLN: 100.0000 mg | Freq: Every day | INTRAMUSCULAR | Status: DC

## 2013-02-09 MED ORDER — TRAZODONE HCL 50 MG PO TABS
50.0000 mg | ORAL_TABLET | Freq: Every evening | ORAL | Status: DC | PRN
  Administered 2013-02-10 – 2013-02-24 (×16): 50 mg via ORAL
  Filled 2013-02-09 (×6): qty 1
  Filled 2013-02-09: qty 8
  Filled 2013-02-09 (×12): qty 1

## 2013-02-09 MED ORDER — LORAZEPAM 1 MG PO TABS
0.0000 mg | ORAL_TABLET | Freq: Two times a day (BID) | ORAL | Status: DC
  Administered 2013-02-12: 2 mg via ORAL
  Administered 2013-02-12: 1 mg via ORAL
  Filled 2013-02-09 (×2): qty 2
  Filled 2013-02-09: qty 1

## 2013-02-09 MED ORDER — TRAMADOL HCL 50 MG PO TABS
50.0000 mg | ORAL_TABLET | Freq: Three times a day (TID) | ORAL | Status: DC
  Administered 2013-02-09: 50 mg via ORAL
  Filled 2013-02-09: qty 1

## 2013-02-09 MED ORDER — OXYCODONE HCL 5 MG PO TABS
5.0000 mg | ORAL_TABLET | ORAL | Status: DC | PRN
  Administered 2013-02-09: 5 mg via ORAL
  Filled 2013-02-09: qty 1

## 2013-02-09 MED ORDER — NICOTINE 21 MG/24HR TD PT24
21.0000 mg | MEDICATED_PATCH | Freq: Every day | TRANSDERMAL | Status: DC
  Administered 2013-02-10 – 2013-02-12 (×3): 21 mg via TRANSDERMAL
  Filled 2013-02-09 (×5): qty 1

## 2013-02-09 MED ORDER — ADULT MULTIVITAMIN W/MINERALS CH
1.0000 | ORAL_TABLET | Freq: Every day | ORAL | Status: DC
  Administered 2013-02-09: 1 via ORAL
  Filled 2013-02-09: qty 1

## 2013-02-09 MED ORDER — VITAMIN B-1 100 MG PO TABS
100.0000 mg | ORAL_TABLET | Freq: Every day | ORAL | Status: DC
  Administered 2013-02-10 – 2013-02-26 (×17): 100 mg via ORAL
  Filled 2013-02-09: qty 4
  Filled 2013-02-09 (×18): qty 1

## 2013-02-09 MED ORDER — GABAPENTIN 300 MG PO CAPS
600.0000 mg | ORAL_CAPSULE | Freq: Three times a day (TID) | ORAL | Status: DC
  Administered 2013-02-09: 600 mg via ORAL
  Filled 2013-02-09 (×2): qty 2

## 2013-02-09 MED ORDER — VITAMIN B-1 100 MG PO TABS
100.0000 mg | ORAL_TABLET | Freq: Every day | ORAL | Status: DC
  Administered 2013-02-09: 100 mg via ORAL
  Filled 2013-02-09: qty 1

## 2013-02-09 MED ORDER — ADULT MULTIVITAMIN W/MINERALS CH
1.0000 | ORAL_TABLET | Freq: Every day | ORAL | Status: DC
  Administered 2013-02-10 – 2013-02-26 (×17): 1 via ORAL
  Filled 2013-02-09 (×19): qty 1

## 2013-02-09 MED ORDER — GABAPENTIN 300 MG PO CAPS
600.0000 mg | ORAL_CAPSULE | Freq: Three times a day (TID) | ORAL | Status: DC
  Administered 2013-02-10 (×2): 600 mg via ORAL
  Filled 2013-02-09 (×7): qty 2

## 2013-02-09 MED ORDER — LORAZEPAM 2 MG/ML IJ SOLN
1.0000 mg | Freq: Four times a day (QID) | INTRAMUSCULAR | Status: DC | PRN
  Administered 2013-02-09: 1 mg via INTRAVENOUS
  Filled 2013-02-09: qty 1

## 2013-02-09 NOTE — ED Notes (Signed)
Social worker at bedside.

## 2013-02-09 NOTE — ED Notes (Signed)
NP notified this nurse that we are awaiting hospitalist orders for pain protocol, and once that is completed she will put in for a bed over at [facility].

## 2013-02-09 NOTE — ED Notes (Signed)
Hospitalist at bedside for consult

## 2013-02-09 NOTE — ED Provider Notes (Signed)
My recommendation for pain control is 1 Percocet (5/325) every 4 hours when necessary for pain  Lyanne Co, MD 02/09/13 1009
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
## 2013-02-09 NOTE — [facility] Counselor (Signed)
Pt ran by Dr. Dub Mikes and Marquita Palms M(AC). Dr. Dub Mikes requests Dr. Lolly Mustache to consult on patient in the morning to determine if inpt care is needed for this patient.   Per Crystal M.(nurse), pt unable to walk and uses a wheelchair for assistance(pt asked nurse for a urinal instead of ambulating to bathroom because he said he couldn't walk), if accepted by Dr. Lolly Mustache for admission, pt will be 1:1 admission due to fall risk and possible assistance with ADL's.  Th writer informed Ava Stevenson(counselor) of disposition.
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
## 2013-02-10 ENCOUNTER — Encounter (HOSPITAL_COMMUNITY): Payer: Self-pay

## 2013-02-10 DIAGNOSIS — M545 Low back pain: Secondary | ICD-10-CM

## 2013-02-10 DIAGNOSIS — F411 Generalized anxiety disorder: Secondary | ICD-10-CM

## 2013-02-10 DIAGNOSIS — F1010 Alcohol abuse, uncomplicated: Secondary | ICD-10-CM

## 2013-02-10 DIAGNOSIS — F3160 Bipolar disorder, current episode mixed, unspecified: Secondary | ICD-10-CM

## 2013-02-10 DIAGNOSIS — I10 Essential (primary) hypertension: Secondary | ICD-10-CM

## 2013-02-10 DIAGNOSIS — F332 Major depressive disorder, recurrent severe without psychotic features: Secondary | ICD-10-CM

## 2013-02-10 DIAGNOSIS — F17200 Nicotine dependence, unspecified, uncomplicated: Secondary | ICD-10-CM

## 2013-02-10 MED ORDER — TRAMADOL HCL 50 MG PO TABS
50.0000 mg | ORAL_TABLET | Freq: Once | ORAL | Status: AC
  Administered 2013-02-10: 50 mg via ORAL
  Filled 2013-02-10 (×2): qty 1

## 2013-02-10 MED ORDER — GABAPENTIN 300 MG PO CAPS
900.0000 mg | ORAL_CAPSULE | Freq: Three times a day (TID) | ORAL | Status: DC
  Administered 2013-02-10 – 2013-02-26 (×48): 900 mg via ORAL
  Filled 2013-02-10 (×18): qty 3
  Filled 2013-02-10: qty 36
  Filled 2013-02-10 (×9): qty 3
  Filled 2013-02-10: qty 36
  Filled 2013-02-10 (×24): qty 3
  Filled 2013-02-10: qty 36
  Filled 2013-02-10: qty 3
  Filled 2013-02-10: qty 2
  Filled 2013-02-10 (×3): qty 3

## 2013-02-10 MED ORDER — METOPROLOL SUCCINATE ER 25 MG PO TB24
25.0000 mg | ORAL_TABLET | Freq: Every day | ORAL | Status: DC
  Administered 2013-02-10 – 2013-02-12 (×3): 25 mg via ORAL
  Filled 2013-02-10 (×6): qty 1

## 2013-02-10 NOTE — [facility] Group Notes (Signed)
[facility] Group Notes: (Clinical Social Work)   02/10/2013      Type of Therapy:  Group Therapy   Participation Level:  Did Not Attend    Ambrose Mantle, LCSW 02/10/2013, 11:11 AM

## 2013-02-10 NOTE — Progress Notes (Signed)
D   Pt is a high fall risk   He needs assistance with all adls   He has a weak and unsteady gait A   Verbal support given  Pt on 1:1  R   Pt safe at present
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
## 2013-02-10 NOTE — Progress Notes (Signed)
1:1 note---Pt has been up in milieu tonight, positive for evening wrap up group.  Pt required total care when changed by myself and MHT tonight.  Even with male MHT it was very difficult as Pt can minimally assist with turning.  Diapers appear to be too small for Pt and Pt states "they aren't going to fit.  Pt has many open sores on body as well as scars and previous wounds.  His left hip is extremely scarred and painful from necrotizing fasciitis that he had previously.   Pt continues to endorse suicidal ideation but contracts for safety on unit.  He remains a 1:1 due to being an extreme fall risk and incontinent.

## 2013-02-10 NOTE — Progress Notes (Signed)
D  Pt has stayed in bed all morning  He grimaces in pain as he changes position and needs assistance to do so   He wears a diaper but is also using the urinal  He needs assist with adls  He does feed himself if food is set before him   He can make his needs known and understands instructions A   Verbal support given   Medications administered and effectiveness monitored  Pt on 1:1    R  Pt safe at present
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
## 2013-02-10 NOTE — Progress Notes (Signed)
Pt is unsteady on his feet   He needs assistance with all adls and he is wheelchair bound    Pt on 1:1   Safe at present

## 2013-02-10 NOTE — Progress Notes (Signed)
.  [facility] Group Notes:  (Nursing/MHT/Case Management/Adjunct)  Date:  02/10/2013  Time:  2100 Type of Therapy:  wrap up group  Participation Level:  Active  Participation Quality:  Appropriate, Attentive and Sharing  Affect:  Flat  Cognitive:  Appropriate  Insight:  Appropriate  Engagement in Group:  Engaged  Modes of Intervention:  Clarification, Education and Support  Summary of Progress/Problems:    Jeffrey [name] 02/10/2013, 10:55 PM

## 2013-02-10 NOTE — H&P (Signed)
  Pt was seen by me today and I agree with the key elements documented in H&P.  

## 2013-02-10 NOTE — Tx Team (Signed)
Initial Interdisciplinary Treatment Plan  PATIENT STRENGTHS: (choose at least two) General fund of knowledge  PATIENT STRESSORS: Financial difficulties Health problems Legal issue Substance abuse   PROBLEM LIST: Problem List/Patient Goals Date to be addressed Date deferred Reason deferred Estimated date of resolution  Substance abuse 02/10/13     SI 02/10/13     Depression 02/10/13                                          DISCHARGE CRITERIA:  Improved stabilization in mood, thinking, and/or behavior Verbal commitment to aftercare and medication compliance  PRELIMINARY DISCHARGE PLAN: Attend aftercare/continuing care group Outpatient therapy Placement in alternative living arrangements  PATIENT/FAMIILY INVOLVEMENT: This treatment plan has been presented to and reviewed with the patient, Jeffrey [name].  The patient and family have been given the opportunity to ask questions and make suggestions.  Jacques Navy A 02/10/2013, 1:25 AM

## 2013-02-10 NOTE — [facility] Suicide Risk Assessment (Signed)
Suicide Risk Assessment  Admission Assessment     Nursing information obtained from:  Patient Demographic factors:  Male;Caucasian;Low socioeconomic status;Living alone;Unemployed Current Mental Status:  Suicidal ideation indicated by patient;Intention to act on suicide plan Loss Factors:  Decline in physical health;Legal issues Historical Factors:  Prior suicide attempts Risk Reduction Factors:  NA  CLINICAL FACTORS:   Alcohol/Substance Abuse/Dependencies  COGNITIVE FEATURES THAT CONTRIBUTE TO RISK:  Loss of executive function    SUICIDE RISK:   Moderate:  Frequent suicidal ideation with limited intensity, and duration, some specificity in terms of plans, no associated intent, good self-control, limited dysphoria/symptomatology, some risk factors present, and identifiable protective factors, including available and accessible social support.  PLAN OF CARE:  Continue detox  1;1 for safety  I certify that inpatient services furnished can reasonably be expected to improve the patient's condition.  Wonda Cerise 02/10/2013, 11:35 AM

## 2013-02-10 NOTE — Progress Notes (Signed)
Psychoeducational Group Note  Date:  02/10/2013 Time:  0945 am  Group Topic/Focus:  Identifying Needs:   The focus of this group is to help patients identify their personal needs that have been historically problematic and identify healthy behaviors to address their needs.  Participation Level:  Did Not Attend   Andrena Mews 02/10/2013,3:24 PM

## 2013-02-11 DIAGNOSIS — M62838 Other muscle spasm: Secondary | ICD-10-CM

## 2013-02-11 DIAGNOSIS — I10 Essential (primary) hypertension: Secondary | ICD-10-CM

## 2013-02-11 MED ORDER — POTASSIUM CHLORIDE CRYS ER 20 MEQ PO TBCR
20.0000 meq | EXTENDED_RELEASE_TABLET | Freq: Two times a day (BID) | ORAL | Status: DC
  Administered 2013-02-11 – 2013-02-26 (×31): 20 meq via ORAL
  Filled 2013-02-11 (×6): qty 1
  Filled 2013-02-11: qty 8
  Filled 2013-02-11 (×9): qty 1
  Filled 2013-02-11: qty 8
  Filled 2013-02-11 (×19): qty 1

## 2013-02-11 MED ORDER — FLUOXETINE HCL 20 MG PO CAPS
20.0000 mg | ORAL_CAPSULE | Freq: Every day | ORAL | Status: DC
  Administered 2013-02-11 – 2013-02-13 (×3): 20 mg via ORAL
  Filled 2013-02-11 (×6): qty 1

## 2013-02-11 MED ORDER — TIZANIDINE HCL 4 MG PO TABS
4.0000 mg | ORAL_TABLET | Freq: Three times a day (TID) | ORAL | Status: DC | PRN
  Administered 2013-02-11 – 2013-02-26 (×34): 4 mg via ORAL
  Filled 2013-02-11: qty 12
  Filled 2013-02-11 (×21): qty 2

## 2013-02-11 NOTE — Progress Notes (Signed)
1:1 note ---Pt up in dayroom watching TV with peers.  Attended evening wrap up group.  Still with complaint of volume and frequency of pain medication.  Feels he needs Dilaudid or Oxy IR increased.  Pt states that he wakes up in "agony" during night due to not being able to get increase in pain medication.  He states "well I'll see you in four hours for more".  Support and encouragement offered, medication given as ordered for pain and anxiety.  Pt remains on 1:1 for Pt safety.  Pt remains safe on unit, will continue to monitor.

## 2013-02-11 NOTE — Progress Notes (Signed)
[facility] MD Progress Note  02/11/2013 12:21 PM Jeffrey [name]  MRN:  [id number]  Subjective:  "I know I drink a lot of alcohol. I agree that I may be addicted to it, but not my pain pills. I hurt all over my back and my legs. My primary doctor said that my pain pills should be increased. Of course I think about hurting myself, but I don't have any to do it here. I feel sick, tired too. I have been depressed for a while. I need depression medicine"  O: [name] appears older than stated age. He is currently on 1:1 supervision for safety. He uses wheel chair to aid mobility due to lower extremity pains, weak gait and balance. I will initiate Prozac 20 mg daily for depression  Diagnosis:   Axis I: Alcohol dependence Axis II: Deferred Axis III:  Past Medical History  Diagnosis Date  . DDD (degenerative disc disease)   . Necrotizing fasciitis   . Degenerative disc disease   . Hypertension   . Degenerative disc disease   . Hepatitis C   . Chronic pain   . DDD (degenerative disc disease)   . Parkinson disease   . Mental disorder   . Depression   . DDD (degenerative disc disease)   . DDD (degenerative disc disease), lumbar    Axis IV: economic problems, housing problems, occupational problems, problems with primary support group and Substance dependence Axis V: 41-50 serious symptoms  ADL's:  Impaired  Sleep: Fair  Appetite:  Fair  Suicidal Ideation:  Plan:  Denies plans because he is here Intent:  Denies intent Means:  Denies means Homicidal Ideation:  Plan:  Denies Intent:  Denies Means:  Denies  AEB (as evidenced by): Per patient's reports  Psychiatric Specialty Exam: Review of Systems  Constitutional: Positive for malaise/fatigue.  Eyes: Negative.   Respiratory: Negative.   Cardiovascular: Negative.   Gastrointestinal: Positive for abdominal pain.  Genitourinary: Negative.   Musculoskeletal: Positive for myalgias, back pain and joint pain.  Skin: Negative.   Neurological:  Positive for tremors and weakness.  Endo/Heme/Allergies: Negative.   Psychiatric/Behavioral: Positive for depression, suicidal ideas and substance abuse. Negative for hallucinations and memory loss. The patient is nervous/anxious. The patient does not have insomnia.     Blood pressure 162/104, pulse 92, temperature 98.1 F (36.7 C), temperature source Oral, resp. rate 18, height 6\' 4"  (1.93 m), weight 99.791 kg (220 lb).Body mass index is 26.79 kg/(m^2).  General Appearance: Frail  Eye Contact::  Poor  Speech:  Clear and Coherent  Volume:  Normal  Mood:  Anxious and Depressed  Affect:  Flat  Thought Process:  Coherent  Orientation:  Oriented to name  Thought Content:  Rumination  Suicidal Thoughts:  Yes.  without intent/plan  Homicidal Thoughts:  No  Memory:  Immediate;   Fair Recent;   Poor Remote;   Poor  Judgement:  Impaired  Insight:  Lacking  Psychomotor Activity:  Restlessness  Concentration:  Poor  Recall:  Poor  Akathisia:  No  Handed:  Right  AIMS (if indicated):     Assets:  Desire for Improvement  Sleep:  Number of Hours: 5.5   Current Medications: Current Facility-Administered Medications  Medication Dose Route Frequency Provider Last Rate Last Dose  . acetaminophen (TYLENOL) tablet 650 mg  650 mg Oral Q6H PRN Earney Navy, NP      . alum & mag hydroxide-simeth (MAALOX/MYLANTA) 200-200-20 MG/5ML suspension 30 mL  30 mL Oral Q4H PRN Julieanne Cotton  Doree Albee, NP      . divalproex (DEPAKOTE) DR tablet 250 mg  250 mg Oral Q12H Earney Navy, NP   250 mg at 02/11/13 0902  . folic acid (FOLVITE) tablet 1 mg  1 mg Oral Daily Earney Navy, NP   1 mg at 02/11/13 0900  . gabapentin (NEURONTIN) capsule 900 mg  900 mg Oral TID Pamella Pert, MD   900 mg at 02/11/13 0900  . LORazepam (ATIVAN) tablet 0-4 mg  0-4 mg Oral Q6H Earney Navy, NP   2 mg at 02/11/13 0903   Followed by  . [START ON 02/12/2013] LORazepam (ATIVAN) tablet 0-4 mg  0-4 mg Oral Q12H Earney Navy, NP      . magnesium hydroxide (MILK OF MAGNESIA) suspension 30 mL  30 mL Oral Daily PRN Earney Navy, NP      . metoprolol succinate (TOPROL-XL) 24 hr tablet 25 mg  25 mg Oral Daily Pamella Pert, MD   25 mg at 02/11/13 0900  . multivitamin with minerals tablet 1 tablet  1 tablet Oral Daily Earney Navy, NP   1 tablet at 02/11/13 0900  . nicotine (NICODERM CQ - dosed in mg/24 hours) patch 21 mg  21 mg Transdermal Daily Earney Navy, NP   21 mg at 02/11/13 0900  . oxyCODONE (Oxy IR/ROXICODONE) immediate release tablet 5 mg  5 mg Oral Q4H PRN Earney Navy, NP   5 mg at 02/11/13 1008  . potassium chloride SA (K-DUR,KLOR-CON) CR tablet 20 mEq  20 mEq Oral BID Fransisca Kaufmann, NP   20 mEq at 02/11/13 0900  . thiamine (VITAMIN B-1) tablet 100 mg  100 mg Oral Daily Earney Navy, NP   100 mg at 02/11/13 0900  . traZODone (DESYREL) tablet 50 mg  50 mg Oral QHS PRN,MR X 1 Earney Navy, NP   50 mg at 02/10/13 2233    Lab Results: No results found for this or any previous visit (from the past 48 hour(s)).  Physical Findings: AIMS: Facial and Oral Movements Muscles of Facial Expression: None, normal Lips and Perioral Area: None, normal Jaw: None, normal Tongue: None, normal,Extremity Movements Upper (arms, wrists, hands, fingers): None, normal Lower (legs, knees, ankles, toes): None, normal, Trunk Movements Neck, shoulders, hips: None, normal, Overall Severity Severity of abnormal movements (highest score from questions above): None, normal Incapacitation due to abnormal movements: None, normal Patient's awareness of abnormal movements (rate only patient's report): No Awareness, Dental Status Current problems with teeth and/or dentures?: Yes Does patient usually wear dentures?: Yes (lost dentures)  CIWA:  CIWA-Ar Total: 5 COWS:  COWS Total Score: 5  Treatment Plan Summary: Daily contact with patient to assess and evaluate symptoms and progress in  treatment Medication management  Plan: Supportive approach/coping skills/relapse prevention. Initiate Prozac 20 mg daily for depression. Obtain Depakote levels. Encouraged out of room, participation in group sessions and application of coping skills when distressed. Continue use of wheel chair and 1:1 supervision. Will continue to monitor response to/adverse effects of medications in use to assure effectiveness. Continue to monitor mood, behavior and interaction with staff and other patients. Continue current plan of care.  Medical Decision Making Problem Points:  Established problem, stable/improving (1), Review of last therapy session (1) and Review of psycho-social stressors (1) Data Points:  Review of medication regiment & side effects (2) Review of new medications or change in dosage (2)  I certify that inpatient services furnished can  reasonably be expected to improve the patient's condition.   Armandina Stammer I, PMHNP-BC 02/11/2013, 12:21 PM

## 2013-02-11 NOTE — Progress Notes (Signed)
[facility] Group Notes:  (Nursing/MHT/Case Management/Adjunct)  Date:  02/11/2013  Time:  11:00 PM  Type of Therapy:  Group Therapy  Participation Level:  Active  Participation Quality:  Appropriate and Sharing  Affect:  Appropriate and Depressed  Cognitive:  Appropriate  Insight:  Appropriate  Engagement in Group:  Engaged  Modes of Intervention:  Socialization and Support  Summary of Progress/Problems: Pt. Was engaged and became tearful in group.Pt. discussed his thoughts, feelings, and actions related to heathy support systems.  Sondra Come 02/11/2013, 11:00 PM

## 2013-02-11 NOTE — Progress Notes (Signed)
D  Pt staying in bed this morning  He continues to report pain and very difficult to change positions in bed without assistance   Pt was talking about having had a shower and shave said he felt better on the outside but not on the inside  He is cooperative and pleasant and tries to do for himself   He remains very unsteady on his feet and general overall weakness especially in his lower extremities A   Verbal support given  Medications administered and effectiveness monitored  1:1 sitter at bedside R   Pt safe at present

## 2013-02-11 NOTE — Progress Notes (Signed)
1:1 note --- Pt resting in bed with eyes closed, respirations even and unlabored.  No distress noted.  MHT reports that Pt has been restless and has been sleeping for short intervals.  1:1 continued as ordered for Pt safety  Pt remains safe

## 2013-02-11 NOTE — Progress Notes (Signed)
Nutrition Brief Note  Patient identified on the Malnutrition Screening Tool (MST) Report  Body mass index is 26.79 kg/(m^2). Patient meets criteria for overweight based on current BMI.   Current diet order is regular. Labs and medications reviewed.   Pt reported that his ideal body weight is 220 lbs and that he currently weighs exactly 220 lbs. He reported that he may have lost weight [place], but that he has been eating very well in [facility]. He also reported that he would sometimes skip meals so that he would not have to go to the bathroom because there was no place to go in the woods.  No nutrition interventions warranted at this time. If nutrition issues arise, please consult RD.   Ebbie Latus RD, LDN
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
## 2013-02-11 NOTE — [facility] Group Notes (Signed)
[facility] Group Notes: (Clinical Social Work)   02/11/2013      Type of Therapy:  Group Therapy   Participation Level:  Did Not Attend    Ambrose Mantle, LCSW 02/11/2013, 12:12 PM

## 2013-02-11 NOTE — Progress Notes (Signed)
D   Pt has been more irritable and complaining to staff about not getting his pain medications frequently enough   He is unsteady on his feet and needs assistance with ADLs  A   Pt on 1:1  R   Safe at present

## 2013-02-11 NOTE — Progress Notes (Signed)
D   Pt remains unsteady on his feet and is essentially chair bound  He has no use of his right leg and needs assistance with all adls and position changes A   Pt on 1:1 R   Safe at present

## 2013-02-11 NOTE — Progress Notes (Signed)
1:1 note-----Pt resting in bed with snoring respirations, no distress noted.  MHT reports that Pt did sit up and use his urinal earlier.  Did not give Pt's 0430 ativan, will give when Pt awakens, as Pt had complained of not being able to sleep the previous night stating "why should I try to sleep when you are just going to be in here all the time".  1:1 continued as ordered for Pt safety, Pt is a 17 on the fall risk scale.  R.  Pt remains safe, will continue to monitor.
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
## 2013-02-12 DIAGNOSIS — M5137 Other intervertebral disc degeneration, lumbosacral region: Secondary | ICD-10-CM

## 2013-02-12 DIAGNOSIS — F1020 Alcohol dependence, uncomplicated: Secondary | ICD-10-CM

## 2013-02-12 MED ORDER — METOPROLOL SUCCINATE ER 50 MG PO TB24
50.0000 mg | ORAL_TABLET | Freq: Every day | ORAL | Status: DC
  Administered 2013-02-13 – 2013-02-26 (×13): 50 mg via ORAL
  Filled 2013-02-12 (×17): qty 1

## 2013-02-12 MED ORDER — NICOTINE POLACRILEX 2 MG MT GUM
2.0000 mg | CHEWING_GUM | OROMUCOSAL | Status: DC | PRN
  Administered 2013-02-12 – 2013-02-26 (×54): 2 mg via ORAL
  Filled 2013-02-12 (×3): qty 1
  Filled 2013-02-12: qty 2
  Filled 2013-02-12 (×4): qty 1

## 2013-02-12 NOTE — Progress Notes (Signed)
1:1 note-----Pt has had a restless night, slept off and on.  Moaning often, complaint of anxiety and chronic pain which he feels is not being adequately relieved.  1:1 continued as needed for Pt safety.  Pt remains safe on unit with MHT sitting in room.  Will continue to monitor.

## 2013-02-12 NOTE — Tx Team (Signed)
Interdisciplinary Treatment Plan Update (Adult)  Date: 02/12/2013   Time Reviewed: 4:13 PM   Progress in Treatment:  Attending groups: Intermittently  Participating in groups: Minimally Taking medication as prescribed: Yes  Tolerating medication: Yes  Family/Significant othe contact made: No  Patient understands diagnosis: Yes, AEB seeking help with substance abuse and depression.   Discussing patient identified problems/goals with staff: Yes  Medical problems stabilized or resolved: Yes  Denies suicidal/homicidal ideation: Yes  Patient has not harmed self or Others: Yes  New problem(s) identified: Pain management is an issue for this patient.   Discharge Plan or Barriers: Patient hopes to qualify for medicare/SSDI and get placement at ALF. Pt has several evictions due to aggressive behaviors, which will pose a problem for placement. Pt believes that he has ACT team in [facility] but cannot recall any further details. Currently exploring after care plan.  Additional comments: N/A  Reason for Continuation of Hospitalization: Patient is a 57 year old white male that uses a wheel chair. Patient is not orientated to date and time. During the assessment the patient had to be redirected several times due to his inability to remain focus. Patient reports feelings of depression associated with chronic pain in his lower back due to lower back pain. Patient reports that he has a plan to kill himself by taking an overdose of drugs. Patient reports that he no longer wants to live due to so much pain in his back. Patient reports that he has attempted to kill himself in the past. Patient was not able to state the name of the psychiatric hospital that he was admitted.  Patient reports a past history of mental illness. Patient reports that he is working with an ACT Team but he does not remember anyone's name on the Team or the name of the agency. Patient reports that he has not been given his medication due to,  "his psychiatrist going on vacation and forgetting about him".  Patient BAL is 297. Patient reports that he drinks every day. Patient reports that he drinks a least 21/2 liters of liquor daily. Patient reports that his intake of alcohol has increased in the past 6 months. Patient reports that he has not been to a rehab center. Patient reports that his last drink was a fifth of vodka, after which he had a half gallon of Listerine and then passed out in his wheel chair behind a shopping center. Patient denies problem with substance abuse initially. Patient probed more about his past alcohol use and he reports that he might need assistance with his alcoholism however, he has not been able to obtain assistance. Patient denies use of any other drugs. Patients UDS was negative Patient denies HI. Patient denies psychosis.  Estimated length of stay: 3-5 days  For review of initial/current patient goals, please see plan of care.  Attendees:  Patient:    Family:    Physician: Geoffery Lyons  02/12/2013 10:00AM  Nursing: Alease Frame RN  02/12/2013 10:00AM  Clinical Social Worker Linwood Gullikson Smart, LCSWA 02/12/2013 10:00AM  Other: Quintella Reichert, RN  02/12/2013 10:00AM  Other:    Other: Massie Kluver, Community Care Coordinator  02/12/2013 10:00AM  Other:     Scribe for Treatment Team:  Trula Slade LCSWA, 02/12/2013 10:00AM

## 2013-02-12 NOTE — Progress Notes (Addendum)
[facility] PROGRESS NOTE  Jeffrey [name] ZOX:[id number] DOB: 09/22/1955 DOA: 02/09/2013 PCP: DE LA CRUZ,IVY, DO  Assessment/Plan: Low back pain/Radiculopathy:  - currently on Gabapentin and tramadol and prn Oxy for breakthrough pain. Pain not well controlled currently with ambulation however better at rest. - complains of muscle spasms as being major contributory to his discomfort; added a muscle relaxer on 6/8 and patient today states that he was finally able to take a nap, thus feels better. Patient was napping when I entered his room.  - per previous notes and nursing assessments, continues to be suicidal and does not see point in continue to live. - would benefit from SW/CM help schedule an appointment for him in pain clinic and long term placement Hypertension - he was on Metoprolol in the past, ran out 2 weeks ago. - he is in pain, however he is likely to always have a degree of pain. Restarted Metoprolol on 6/7. His BP is somewhat better but his degree of pain is a confounder. Increase Metop today.  Tobacco abuse: Patient smokes about a pack of cigarettes per day. Counseled, and placed on Nicoderm CQ patch.  ETOH abuse: Patient drinks alcohol a lot and even drinks Listerine mouth wash occasionally. He states that he had his last drink on 02/08/13. ETOH level was 297 on initial evaluation.  - denies history of seizures with ETOH cessation  - no more withdrawal symptoms today.  4. Bipolar disorder: Per primary.  5. Depression/Suicidal ideation: Per primary.  6. Homelessness: Building surveyor.   HPI/Subjective: - napping this morning, feels better.   Objective: Filed Vitals:   02/10/13 0100 02/11/13 1319 02/12/13 0730  BP: 162/104 140/89 164/97  Pulse: 92 72 83  Temp: 98.1 F (36.7 C)  97 F (36.1 C)  TempSrc: Oral  Oral  Resp: 18 18 22   Height: 6\' 4"  (1.93 m)    Weight: 99.791 kg (220 lb)     No intake or output data in the 24 hours ending 02/12/13 1130 Filed  Weights   02/10/13 0100  Weight: 99.791 kg (220 lb)    Exam:   General:  NAD, sleeping  Cardiovascular: regular rate and rhythm, without MRG  Respiratory: good air movement, clear to auscultation throughout, no wheezing, ronchi or rales  Abdomen: soft, not tender to palpation, positive bowel sounds  MSK: no peripheral edema  Neuro: non focal exam  Data Reviewed: Basic Metabolic Panel:  Recent Labs Lab 02/07/13 1650 02/08/13 1630  NA 142 143  K 2.9* 3.0*  CL 104 104  CO2 24 23  GLUCOSE 99 91  BUN 12 12  CREATININE 0.80 0.66  CALCIUM 8.3* 8.4   Liver Function Tests:  Recent Labs Lab 02/07/13 1650 02/08/13 1630  AST 36 36  ALT 15 16  ALKPHOS 130* 117  BILITOT 0.2* 0.2*  PROT 6.6 6.6  ALBUMIN 3.1* 3.1*   CBC:  Recent Labs Lab 02/07/13 1650 02/08/13 1630  WBC 5.3 5.1  NEUTROABS 2.7  --   HGB 13.7 13.5  HCT 40.2 41.4  MCV 85.0 85.4  PLT 152 132*   Scheduled Meds: . divalproex  250 mg Oral Q12H  . FLUoxetine  20 mg Oral Daily  . folic acid  1 mg Oral Daily  . gabapentin  900 mg Oral TID  . LORazepam  0-4 mg Oral Q12H  . metoprolol succinate  25 mg Oral Daily  . multivitamin with minerals  1 tablet Oral Daily  . nicotine  21 mg Transdermal  Daily  . potassium chloride  20 mEq Oral BID  . thiamine  100 mg Oral Daily   Time spent: 25  Pamella Pert, MD [facility] Pager [phone]. If 7 PM - 7 AM, please contact night-coverage at [email or web address], password [facility] 02/12/2013, 11:30 AM  LOS: 3 days

## 2013-02-12 NOTE — [facility] Group Notes (Signed)
[facility] LCSW Group Therapy  02/12/2013 10:57 AM  Type of Therapy:  Discharge Planning  Participation Level:  Did Not Attend  Smart, Herbert Seta 02/12/2013, 10:57 AM

## 2013-02-12 NOTE — OR Nursing (Signed)
1:1 note----  Pt resting in bed with eyes closed, respirations even and unlabored.  Pt has been restless in bed, but has slept off and on.  1:1 continued as ordered for Pt safety.  Pt remains safe, will continue to monitor.

## 2013-02-12 NOTE — [facility] Group Notes (Signed)
[facility] LCSW Group Therapy  02/12/2013 2:30 PM  Type of Therapy:  Group Therapy  Participation Level:  Minimal  Participation Quality:  Drowsy  Affect:  Depressed  Cognitive:  Oriented  Insight:  Improving  Engagement in Therapy:  Improving  Modes of Intervention:  Discussion, Education, Exploration, Socialization and Support  Summary of Progress/Problems: Today's Topic: Overcoming Obstacles. Pt identified obstacles faced currently and processed barriers involved in overcoming these obstacles. Pt identified steps necessary for overcoming these obstacles and explored motivation (internal and external) for facing these difficulties head on. Pt further identified one area of concern in their lives and chose a skill of focus pulled from their "toolbox." Pt was drowsy throughout group and frequently needed to be awakened. Jeffrey [name] was able to process information in group AEB contributing to discussion about obstacles that he is currently facing. Jeffrey [name] feels that his biggest obstacle involves his physical limitations and health problems, which he identified as contributing to substance abuse. He discussed the physical drawbacks related to physical difficulties and processed ways that he can cope with these in a healthy and proactive manner. Jeffrey [name] hopes to work on his ability to take things one day at a time and work on his ability to follow through with treatment.   Smart, Jeffrey [name] 02/12/2013, 2:30 PM

## 2013-02-12 NOTE — Progress Notes (Signed)
[facility] MD Progress Note  02/12/2013 4:02 PM Drexler Maland  MRN:  [id number] Subjective:  Somatically focused. States that he went off the pain pills and had to start drinking to medicate the pain. Claims the pain makes him feel very depressed Diagnosis:  Alcohol Dependence  ADL's:  Impaired  Sleep: Fair  Appetite:  Poor  Suicidal Ideation:  Plan:  denies Intent:  denies Means:  denies Homicidal Ideation:  Plan:  denies Intent:  denies Means:  denies AEB (as evidenced by):  Psychiatric Specialty Exam: Review of Systems  Constitutional: Negative.   HENT: Positive for neck pain.   Eyes: Negative.   Respiratory: Negative.   Cardiovascular: Negative.   Gastrointestinal: Negative.   Genitourinary: Negative.   Musculoskeletal: Positive for back pain and joint pain.  Skin: Negative.   Neurological: Negative.   Endo/Heme/Allergies: Negative.   Psychiatric/Behavioral: Positive for depression and substance abuse. The patient is nervous/anxious and has insomnia.     Blood pressure 164/97, pulse 83, temperature 97 F (36.1 C), temperature source Oral, resp. rate 22, height 6\' 4"  (1.93 m), weight 99.791 kg (220 lb).Body mass index is 26.79 kg/(m^2).  General Appearance: Disheveled  Eye Solicitor::  Fair  Speech:  Clear and Coherent and Slow  Volume:  Decreased  Mood:  Anxious, Depressed and Irritable  Affect:  anxious, claims pain  Thought Process:  Coherent and Goal Directed  Orientation:  Full (Time, Place, and Person)  Thought Content:  somatically focused  Suicidal Thoughts:  Intermittent  Homicidal Thoughts:  No  Memory:  Immediate;   Fair Recent;   Fair Remote;   Fair  Judgement:  Fair  Insight:  Present and Shallow  Psychomotor Activity:  Restlessness  Concentration:  Fair  Recall:  Fair  Akathisia:  No  Handed:  Right  AIMS (if indicated):     Assets:  Desire for Improvement  Sleep:  Number of Hours: 5.25   Current Medications: Current Facility-Administered  Medications  Medication Dose Route Frequency Provider Last Rate Last Dose  . acetaminophen (TYLENOL) tablet 650 mg  650 mg Oral Q6H PRN Earney Navy, NP      . alum & mag hydroxide-simeth (MAALOX/MYLANTA) 200-200-20 MG/5ML suspension 30 mL  30 mL Oral Q4H PRN Earney Navy, NP      . divalproex (DEPAKOTE) DR tablet 250 mg  250 mg Oral Q12H Earney Navy, NP   250 mg at 02/12/13 0850  . FLUoxetine (PROZAC) capsule 20 mg  20 mg Oral Daily Sanjuana Kava, NP   20 mg at 02/12/13 0850  . folic acid (FOLVITE) tablet 1 mg  1 mg Oral Daily Earney Navy, NP   1 mg at 02/12/13 0851  . gabapentin (NEURONTIN) capsule 900 mg  900 mg Oral TID Pamella Pert, MD   900 mg at 02/12/13 1152  . LORazepam (ATIVAN) tablet 0-4 mg  0-4 mg Oral Q12H Earney Navy, NP   2 mg at 02/12/13 0639  . magnesium hydroxide (MILK OF MAGNESIA) suspension 30 mL  30 mL Oral Daily PRN Earney Navy, NP      . Melene Muller ON 02/13/2013] metoprolol succinate (TOPROL-XL) 24 hr tablet 50 mg  50 mg Oral Daily Pamella Pert, MD      . multivitamin with minerals tablet 1 tablet  1 tablet Oral Daily Earney Navy, NP   1 tablet at 02/12/13 0851  . nicotine (NICODERM CQ - dosed in mg/24 hours) patch 21 mg  21 mg Transdermal Daily  Earney Navy, NP   21 mg at 02/12/13 0852  . oxyCODONE (Oxy IR/ROXICODONE) immediate release tablet 5 mg  5 mg Oral Q4H PRN Earney Navy, NP   5 mg at 02/12/13 1433  . potassium chloride SA (K-DUR,KLOR-CON) CR tablet 20 mEq  20 mEq Oral BID Fransisca Kaufmann, NP   20 mEq at 02/12/13 0852  . thiamine (VITAMIN B-1) tablet 100 mg  100 mg Oral Daily Earney Navy, NP   100 mg at 02/12/13 0852  . tiZANidine (ZANAFLEX) tablet 4 mg  4 mg Oral Q8H PRN Pamella Pert, MD   4 mg at 02/12/13 1020  . traZODone (DESYREL) tablet 50 mg  50 mg Oral QHS PRN,MR X 1 Earney Navy, NP   50 mg at 02/11/13 2152    Lab Results: No results found for this or any previous visit (from the past 48  hour(s)).  Physical Findings: AIMS: Facial and Oral Movements Muscles of Facial Expression: None, normal Lips and Perioral Area: None, normal Jaw: None, normal Tongue: None, normal,Extremity Movements Upper (arms, wrists, hands, fingers): None, normal Lower (legs, knees, ankles, toes): None, normal, Trunk Movements Neck, shoulders, hips: None, normal, Overall Severity Severity of abnormal movements (highest score from questions above): None, normal Incapacitation due to abnormal movements: None, normal Patient's awareness of abnormal movements (rate only patient's report): No Awareness, Dental Status Current problems with teeth and/or dentures?: Yes Does patient usually wear dentures?: Yes (lost dentures)  CIWA:  CIWA-Ar Total: 7 COWS:  COWS Total Score: 5  Treatment Plan Summary: Daily contact with patient to assess and evaluate symptoms and progress in treatment Medication management  Plan: Supportive approach/coping skills/relapse prevention            Reassess and address the co morbidities            Reassess pain management  Medical Decision Making Problem Points:  Review of psycho-social stressors (1) Data Points:  Review of medication regiment & side effects (2) Review of new medications or change in dosage (2)  I certify that inpatient services furnished can reasonably be expected to improve the patient's condition.   Bee Marchiano A 02/12/2013, 4:02 PM
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
## 2013-02-12 NOTE — Progress Notes (Signed)
Adult Psychoeducational Group Note  Date:  02/12/2013 Time:  9:11 PM  Group Topic/Focus:  Wrap-Up Group:   The focus of this group is to help patients review their daily goal of treatment and discuss progress on daily workbooks.  Participation Level:  Did Not Attend  Participation Quality:    Affect:    Cognitive:    Insight:   Engagement in Group:    Modes of Intervention:    Additional Comments:    [name] [name] 02/12/2013, 9:11 PM
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
## 2013-02-12 NOTE — Progress Notes (Signed)
1 - 1 note: D. Pt in cafeteria and finishing up dinner at this time. Pt has been up and visible in milieu this afternoon. Pt had episode of urinary and fecal incontinence. Pt was assisted with getting cleaned up and also with receiving a shower. Pt needed assistance standing, needed assistance ambulating but was able to clean himself with minimal assistance. Pt had difficulty cleaning his anal region and needed assistance from staff in doing so. Pt also needed assistance in getting dressed. Pt was visible in group settings today and did participate in the therapy. Pt has received all medications today without incident and has complained of pain and requested and received PRN pain medications. A. Support and assistance provided throughout the day as pt needed assistance in completing various ADL's. R. Pt willingly accepted assistance, safety has been maintained throughout.

## 2013-02-12 NOTE — Progress Notes (Signed)
1 - 1 note: D. Pt in group at this time. Pt has been up and visible in milieu for much of the early afternoon. Pt spoke about not knowing what he will do when he gets out of here and spoke about possibly going to an assisted living facility, pt states that he is unable to walk very well and unable to adequately take care of himself and feels he needs someone to help him when he gets out of here. Pt also spoke about feeling he will start drinking again and end up living in the woods if he is unable to get to the assistance he needs. Pt has also endorsed feelings of depression, pt did receive medications this afternoon without incident.  A. Support and encouragement provided, medication education completed. R. Pt verbalized understanding, will continue to monitor.

## 2013-02-13 DIAGNOSIS — F329 Major depressive disorder, single episode, unspecified: Secondary | ICD-10-CM

## 2013-02-13 MED ORDER — OXYCODONE HCL 5 MG PO TABS: 10.0000 mg | ORAL_TABLET | Freq: Three times a day (TID) | ORAL | Status: DC

## 2013-02-13 MED ORDER — OXYCODONE HCL 5 MG PO TABS
10.0000 mg | ORAL_TABLET | Freq: Three times a day (TID) | ORAL | Status: DC | PRN
  Administered 2013-02-14: 10 mg via ORAL
  Filled 2013-02-13: qty 2

## 2013-02-13 MED ORDER — DULOXETINE HCL 30 MG PO CPEP
30.0000 mg | ORAL_CAPSULE | Freq: Every day | ORAL | Status: DC
  Administered 2013-02-14: 30 mg via ORAL
  Filled 2013-02-13 (×4): qty 1

## 2013-02-13 MED ORDER — OXYCODONE HCL 5 MG PO TABS: 10.0000 mg | ORAL_TABLET | Freq: Three times a day (TID) | ORAL | Status: DC | PRN

## 2013-02-13 MED ORDER — CHLORDIAZEPOXIDE HCL 25 MG PO CAPS
25.0000 mg | ORAL_CAPSULE | Freq: Four times a day (QID) | ORAL | Status: DC | PRN
  Administered 2013-02-13 – 2013-02-26 (×19): 25 mg via ORAL
  Filled 2013-02-13 (×21): qty 1

## 2013-02-13 MED ORDER — OXYCODONE HCL 5 MG PO TABS
5.0000 mg | ORAL_TABLET | Freq: Once | ORAL | Status: AC
  Administered 2013-02-13: 5 mg via ORAL
  Filled 2013-02-13: qty 1

## 2013-02-13 NOTE — Progress Notes (Signed)
Patient ID: Jeffrey [name], male   DOB: Dec 27, 1955, 57 y.o.   MRN: [id number]  D: Pt was slightly irritated with the writer. Approached the writer stating that "I'm behind one hour on my nicotine gum". Writer explained that the gum is as needed. Then pt asked the writer for his oxy, explaining that the dose had been increased to 10 mg. Writer explained that he'd gotten his last dose at apprx 1900 and that an order would have to be gotten from the extender to give the med earlier. Pt stated he'd had a good day. However, Clinical research associate informed pt of report given that he'd been agitated off and on throughout the day. Pt explained that his sitter was great, but that previously it took 3 people to change his diaper. Writer encouraged pt to Archivist and offered to remind every two hours. Pt stated he doesn't have a problem just doesn't like the mesh panties.  A:  Support and encouragement was offered. 15 min checks continued for safety.  R: Pt remains safe.

## 2013-02-13 NOTE — [facility] Group Notes (Signed)
[facility] LCSW Group Therapy  02/13/2013 2:48 PM  Type of Therapy:  Group Therapy  Participation Level:  Minimal  Participation Quality:  Attentive  Affect:  Appropriate  Cognitive:  Appropriate  Insight:  Engaged  Engagement in Therapy:  Engaged  Modes of Intervention:  Education, Exploration, Problem-solving, Socialization and Support  Summary of Progress/Problems: MHA Speaker came to talk about his personal journey with substance abuse and addiction. The pt processed ways by which to relate to the speaker. MHA speaker provided handouts and educational information pertaining to groups and services offered by the [facility].  [name] sat quietly throughout majority of session and remained attentive. He offered assistance in helping another group member figure out the proper bus line to take in order to reach [facility]. [name] thanked the speaker for coming.   Smart, [name] 02/13/2013, 2:48 PM

## 2013-02-13 NOTE — Progress Notes (Signed)
RN 1:1 Note  D: Patient denies SI/HI and A/V hallucinations; patient has complaints of pain; patient is angry this morning but has now calmed down;  A: Explanation of medications; encouraged to attend groups/; encouraged to express feelings or concerns in an appropriate manner  R: Patient has become more calm and not as irritable and angry; patient received a bath, and has put on some clean clothes and attended the 1100 hr group; patient is has minimal interaction with his peers

## 2013-02-13 NOTE — Progress Notes (Signed)
[facility] PROGRESS NOTE  Jeffrey [name] JYN:[id number] DOB: March 02, 1956 DOA: 02/09/2013 PCP: DE LA CRUZ,IVY, DO  Assessment/Plan: Low back pain/Radiculopathy:  - currently on Gabapentin and tramadol and Oxy for breakthrough pain. Pain not well controlled currently. He states that he does not achieve comfort and oxy gets the edge off.  - will increase his Oxy to 10 mg TID rather than 5 mg every 4-6 hours. - complains of muscle spasms as being major contributory to his discomfort; added a muscle relaxer on 6/8. Patient feels like the muscle relaxer helps.  - would benefit from SW/CM help schedule an appointment for him in pain clinic and long term placement Hypertension - he was on Metoprolol in the past, ran out 2 weeks ago. - he is in pain, however he is likely to always have a degree of pain. Restarted Metoprolol on 6/7 and increased dose on 6/9 - has high morning reads, patient endorsing pain. Today most recent BP normal.  - will monitor with changing pain regimen, may need to add ACEI if needed.  Tobacco abuse: Patient smokes about a pack of cigarettes per day. Counseled, and placed on Nicoderm CQ patch.  ETOH abuse: Patient drinks alcohol a lot and even drinks Listerine mouth wash occasionally. He states that he had his last drink on 02/08/13. ETOH level was 297 on initial evaluation.  4. Bipolar disorder: Per primary.  5. Depression/Suicidal ideation: Per primary.  6. Homelessness: Building surveyor.   HPI/Subjective: - crying in pain  Objective: Filed Vitals:   02/13/13 0844 02/13/13 0845 02/13/13 1040 02/13/13 1305  BP: 158/91 173/101 172/96 102/63  Pulse: 96 68  92  Temp: 97.7 F (36.5 C)     TempSrc: Oral     Resp: 20     Height:      Weight:      SpO2:  98%     No intake or output data in the 24 hours ending 02/13/13 1633 Filed Weights   02/10/13 0100  Weight: 99.791 kg (220 lb)    Exam:   General:  Crying, appears distressed  Cardiovascular: regular  rate and rhythm, without MRG  Respiratory: good air movement, clear to auscultation throughout, no wheezing, ronchi or rales  Abdomen: soft, not tender to palpation, positive bowel sounds  MSK: no peripheral edema  Neuro: non focal exam, could not evaluate LE due to severe pain  Data Reviewed: Basic Metabolic Panel:  Recent Labs Lab 02/07/13 1650 02/08/13 1630  NA 142 143  K 2.9* 3.0*  CL 104 104  CO2 24 23  GLUCOSE 99 91  BUN 12 12  CREATININE 0.80 0.66  CALCIUM 8.3* 8.4   Liver Function Tests:  Recent Labs Lab 02/07/13 1650 02/08/13 1630  AST 36 36  ALT 15 16  ALKPHOS 130* 117  BILITOT 0.2* 0.2*  PROT 6.6 6.6  ALBUMIN 3.1* 3.1*   CBC:  Recent Labs Lab 02/07/13 1650 02/08/13 1630  WBC 5.3 5.1  NEUTROABS 2.7  --   HGB 13.7 13.5  HCT 40.2 41.4  MCV 85.0 85.4  PLT 152 132*   Scheduled Meds: . divalproex  250 mg Oral Q12H  . FLUoxetine  20 mg Oral Daily  . folic acid  1 mg Oral Daily  . gabapentin  900 mg Oral TID  . LORazepam  0-4 mg Oral Q12H  . metoprolol succinate  50 mg Oral Daily  . multivitamin with minerals  1 tablet Oral Daily  . potassium chloride  20 mEq  Oral BID  . thiamine  100 mg Oral Daily   Time spent: 35  Pamella Pert, MD [facility] Pager [phone]. If 7 PM - 7 AM, please contact night-coverage at [email or web address], password [facility] 02/13/2013, 4:33 PM  LOS: 4 days
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
## 2013-02-13 NOTE — Progress Notes (Signed)
Recreation Therapy Notes   Date: 06.10.2014 Time: 2:30pm Location: [address] Dayroom      Group Topic/Focus: Musician (AAA/T)  Participation Level: Active  Participation Quality: Appropriate  Affect: Euthymic  Cognitive: Appropriate  Additional Comments: 06.10.2014 Session = AAA Session; Dog Team = [facility] & handler  Patient pet and visited with Bledsoe. Patient asked appropriate questions about [facility] and his training. Patient interacted appropriately with peers, LRT & MHT.   Marykay Lex Janzen Sacks, LRT/CTRS  Ovidio Steele L 02/13/2013 4:18 PM

## 2013-02-13 NOTE — BHH Group Notes (Signed)
[facility] LCSW Aftercare Discharge Planning Group Note   02/13/2013 9:32 AM  Participation Quality:  Did not attend.   Smart, [employer]
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
## 2013-02-13 NOTE — Progress Notes (Signed)
RN 1:1 Note  D: Patient reports being depressed and making statements about being half dead; patient is now more polite and courteous and still reports pain; patient is very preoccupied with his medications; patient picking   A: 1:1 observation for the patient; encouraged to engage in groups   R: Patient reports some relief in pain from prn medication but he still has pain; patient attending afternoon groups and is engaging

## 2013-02-14 DIAGNOSIS — F339 Major depressive disorder, recurrent, unspecified: Secondary | ICD-10-CM

## 2013-02-14 MED ORDER — OXYCODONE HCL 5 MG PO TABS: 10.0000 mg | ORAL_TABLET | Freq: Three times a day (TID) | ORAL | Status: DC

## 2013-02-14 MED ORDER — OXYCODONE HCL 5 MG PO TABS
5.0000 mg | ORAL_TABLET | Freq: Four times a day (QID) | ORAL | Status: DC | PRN
  Administered 2013-02-14: 5 mg via ORAL
  Filled 2013-02-14: qty 1

## 2013-02-14 MED ORDER — DULOXETINE HCL 60 MG PO CPEP
60.0000 mg | ORAL_CAPSULE | Freq: Every day | ORAL | Status: DC
  Administered 2013-02-15 – 2013-02-26 (×12): 60 mg via ORAL
  Filled 2013-02-14 (×10): qty 1
  Filled 2013-02-14: qty 4
  Filled 2013-02-14 (×3): qty 1

## 2013-02-14 MED ORDER — OXYCODONE HCL 5 MG PO TABS
5.0000 mg | ORAL_TABLET | Freq: Once | ORAL | Status: AC
  Administered 2013-02-14: 5 mg via ORAL
  Filled 2013-02-14: qty 1

## 2013-02-14 MED ORDER — OXYCODONE HCL 5 MG PO TABS
10.0000 mg | ORAL_TABLET | Freq: Three times a day (TID) | ORAL | Status: DC
  Administered 2013-02-14 – 2013-02-26 (×36): 10 mg via ORAL
  Filled 2013-02-14: qty 1
  Filled 2013-02-14 (×9): qty 2
  Filled 2013-02-14 (×2): qty 1
  Filled 2013-02-14 (×28): qty 2

## 2013-02-14 MED ORDER — OXYCODONE HCL 5 MG PO TABS: 10.0000 mg | ORAL_TABLET | Freq: Four times a day (QID) | ORAL | Status: DC | PRN

## 2013-02-14 NOTE — Progress Notes (Signed)
Adult Psychoeducational Group Note  Date:  02/14/2013 Time:  1:40 PM  Group Topic/Focus:  Personal Choices and Values:   The focus of this group is to help patients assess and explore the importance of values in their lives, how their values affect their decisions, how they express their values and what opposes their expression.  Participation Level:  Did Not Attend  Participation Quality:  Did not attend  Affect:  DNA  Cognitive:  DNA  Insight: None  Engagement in Group:  None  Modes of Intervention:  DNA  Additional Comments:    Reynolds Bowl 02/14/2013, 1:40 PM

## 2013-02-14 NOTE — BHH Suicide Risk Assessment (Signed)
BHH INPATIENT:  Family/Significant Other Suicide Prevention Education  Suicide Prevention Education:  Patient Refusal for Family/Significant Other Suicide Prevention Education: The patient Jeffrey [name] has refused to provide written consent for family/significant other to be provided Family/Significant Other Suicide Prevention Education during admission and/or prior to discharge.  Physician notified.  SPI provided in detail to patient. Patient encouraged to ask questions and express concerns. SPE pamphlet provided to patient and crisis numbers provided.   Smart, Meeya Goldin 02/14/2013, 3:59 PM

## 2013-02-14 NOTE — BHH Group Notes (Signed)
[facility] LCSW Group Therapy  02/14/2013 2:26 PM  Type of Therapy:  Group Therapy  Participation Level:  Did Not Attend  Smart, Deane Melick 02/14/2013, 2:26 PM

## 2013-02-14 NOTE — Progress Notes (Signed)
1:1 Note  Patient currently in group with MHT present. Patient appears in no distress and appears attentive. Will continue to monitor patient for safety.

## 2013-02-14 NOTE — Progress Notes (Signed)
D: Pt continues to be confrontational with the writer in regards to his meds. Pt requested his oxy, however had to be informed it was too early. However, Clinical research associate spoke to PA and Web designer of option to get 5 mg once, but that he'd have to wait for 8 hrs to get 10 mg. Pt refused, stating he would wait til 0100 to get 10 mg.  A:  Support and encouragement was offered. 15 min checks continued for safety.  R: Pt remains safe.

## 2013-02-14 NOTE — Progress Notes (Signed)
[facility] MD Progress Note  02/14/2013 3:21 PM Jeffrey [name]  MRN:  [id number] Subjective:  Jeffrey [name] continues to complain of pain. It is not clear where he is going to go from here. He also endorses depression. He was started on Cymbalta. Will optimize treatment with it. He states he did better on the !0mg  TID and he was changed back to 5 mg today.  Diagnosis:  Major Depression recurrent, Alcohol Dependence  ADL's:  Impaired  Sleep: Poor  Appetite:  Fair  Suicidal Ideation:  Plan:  denies Intent:  denies Means:  denies Homicidal Ideation:  Plan:  denies Intent:  denies Means:  denies AEB (as evidenced by):  Psychiatric Specialty Exam: Review of Systems  Constitutional: Negative.   HENT: Negative.   Eyes: Negative.   Respiratory: Negative.   Cardiovascular: Negative.   Gastrointestinal: Negative.   Genitourinary: Negative.   Musculoskeletal: Positive for back pain.  Skin: Negative.   Neurological: Negative.   Endo/Heme/Allergies: Negative.   Psychiatric/Behavioral: Positive for depression and substance abuse. The patient is nervous/anxious.     Blood pressure 160/90, pulse 64, temperature 97 F (36.1 C), temperature source Oral, resp. rate 18, height 6\' 4"  (1.93 m), weight 99.791 kg (220 lb), SpO2 98.00%.Body mass index is 26.79 kg/(m^2).  General Appearance: Fairly Groomed  Patent attorney::  Fair  Speech:  Clear and Coherent  Volume:  fluctuates  Mood:  Anxious, Depressed and in pain  Affect:  anxious, in pain  Thought Process:  Coherent and Goal Directed  Orientation:  Full (Time, Place, and Person)  Thought Content:  somatically focused, worries, concerns  Suicidal Thoughts:  No  Homicidal Thoughts:  No  Memory:  Immediate;   Fair Recent;   Fair Remote;   Fair  Judgement:  Intact  Insight:  Present  Psychomotor Activity:  Restlessness  Concentration:  Fair  Recall:  Fair  Akathisia:  No  Handed:  Right  AIMS (if indicated):     Assets:  Desire for Improvement   Sleep:  Number of Hours: 4.5   Current Medications: Current Facility-Administered Medications  Medication Dose Route Frequency Provider Last Rate Last Dose  . acetaminophen (TYLENOL) tablet 650 mg  650 mg Oral Q6H PRN Earney Navy, NP      . alum & mag hydroxide-simeth (MAALOX/MYLANTA) 200-200-20 MG/5ML suspension 30 mL  30 mL Oral Q4H PRN Earney Navy, NP      . chlordiazePOXIDE (LIBRIUM) capsule 25 mg  25 mg Oral Q6H PRN Kerry Hough, PA-C   25 mg at 02/13/13 2149  . divalproex (DEPAKOTE) DR tablet 250 mg  250 mg Oral Q12H Earney Navy, NP   250 mg at 02/14/13 0730  . DULoxetine (CYMBALTA) DR capsule 30 mg  30 mg Oral Daily Rachael Fee, MD   30 mg at 02/14/13 0730  . folic acid (FOLVITE) tablet 1 mg  1 mg Oral Daily Earney Navy, NP   1 mg at 02/14/13 0730  . gabapentin (NEURONTIN) capsule 900 mg  900 mg Oral TID Pamella Pert, MD   900 mg at 02/14/13 1159  . magnesium hydroxide (MILK OF MAGNESIA) suspension 30 mL  30 mL Oral Daily PRN Earney Navy, NP      . metoprolol succinate (TOPROL-XL) 24 hr tablet 50 mg  50 mg Oral Daily Pamella Pert, MD   50 mg at 02/14/13 0730  . multivitamin with minerals tablet 1 tablet  1 tablet Oral Daily Earney Navy, NP   1  tablet at 02/14/13 0730  . nicotine polacrilex (NICORETTE) gum 2 mg  2 mg Oral PRN Rachael Fee, MD   2 mg at 02/14/13 1419  . oxyCODONE (Oxy IR/ROXICODONE) immediate release tablet 5 mg  5 mg Oral Q6H PRN Belkys A Regalado, MD   5 mg at 02/14/13 1442  . potassium chloride SA (K-DUR,KLOR-CON) CR tablet 20 mEq  20 mEq Oral BID Fransisca Kaufmann, NP   20 mEq at 02/14/13 0730  . thiamine (VITAMIN B-1) tablet 100 mg  100 mg Oral Daily Earney Navy, NP   100 mg at 02/14/13 0730  . tiZANidine (ZANAFLEX) tablet 4 mg  4 mg Oral Q8H PRN Pamella Pert, MD   4 mg at 02/14/13 0653  . traZODone (DESYREL) tablet 50 mg  50 mg Oral QHS PRN,MR X 1 Earney Navy, NP   50 mg at 02/13/13 2320    Lab Results:   Results for orders placed during the hospital encounter of 02/09/13 (from the past 48 hour(s))  VALPROIC ACID LEVEL     Status: Abnormal   Collection Time    02/13/13  6:40 AM      Result Value Range   Valproic Acid Lvl 17.6 (*) 50.0 - 100.0 ug/mL    Physical Findings: AIMS: Facial and Oral Movements Muscles of Facial Expression: None, normal Lips and Perioral Area: None, normal Jaw: None, normal Tongue: None, normal,Extremity Movements Upper (arms, wrists, hands, fingers): None, normal Lower (legs, knees, ankles, toes): None, normal, Trunk Movements Neck, shoulders, hips: None, normal, Overall Severity Severity of abnormal movements (highest score from questions above): None, normal Incapacitation due to abnormal movements: None, normal Patient's awareness of abnormal movements (rate only patient's report): No Awareness, Dental Status Current problems with teeth and/or dentures?: Yes Does patient usually wear dentures?: Yes (lost dentures)  CIWA:  CIWA-Ar Total: 7 COWS:  COWS Total Score: 5  Treatment Plan Summary: Daily contact with patient to assess and evaluate symptoms and progress in treatment Medication management  Plan: Supportive approach/coping skills/relapse prevention           Resume previous pain management regime           Increase Cymbalta to 60 mg daily  Medical Decision Making Problem Points:  Review of last therapy session (1) and Review of psycho-social stressors (1) Data Points:  Review of medication regiment & side effects (2) Review of new medications or change in dosage (2)  I certify that inpatient services furnished can reasonably be expected to improve the patient's condition.   Sheniqua Carolan A 02/14/2013, 3:21 PM
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
## 2013-02-14 NOTE — Tx Team (Signed)
Interdisciplinary Treatment Plan Update (Adult)  Date: 02/12/2013  Time Reviewed: 4:13 PM  Progress in Treatment:  Attending groups: Intermittently  Participating in groups: Minimally  Taking medication as prescribed: Yes  Tolerating medication: Yes  Family/Significant othe contact made: No  Patient understands diagnosis: Yes, AEB seeking help with substance abuse and depression.  Discussing patient identified problems/goals with staff: Yes  Medical problems stabilized or resolved: Yes  Denies suicidal/homicidal ideation: Yes  Patient has not harmed self or Others: Yes  New problem(s) identified: Pain management is an issue for this patient.  Discharge Plan or Barriers: Exploring transitional housing in [place] and surrounding areas that are wheelchair accessible. Also exploring ACT services.  Additional comments: N/A  Reason for Continuation of Hospitalization:  Depression Medication managment Estimated length of stay: 1 day  For review of initial/current patient goals, please see plan of care.  Attendees:  Patient:    Family:    Physician: Geoffery Lyons  02/14/2013 10:05 AM   Nursing: Maureen Ralphs RN 02/14/2013 10:05 AM   Clinical Social Worker Yaw Escoto Smart, LCSWA  02/14/2013 10:05 AM  Other: Idelia Salm RN    Other:    OtherVictorino Dike PA Intern  02/14/2013 10:05 AM   Other:    Scribe for Treatment Team:  [employer] LCSWA, 02/14/2013 10:05 AM

## 2013-02-14 NOTE — Progress Notes (Signed)
D: Patient denies SI/HI and auditory and visual hallucinations. The patient has an irritable mood and affect. The patient states that he is "mad about medications." The patient is requesting more pain medications that he is currently on (patient getting oxy 10mg  q 8 hours). The patient is attending some groups on the unit. The patient is using a wheelchair to get around for safety isseus and currently has a 1:1 sitter. The patient reports that he wants to "stay here a little while longer."  A: Patient given emotional support from RN. Patient encouraged to come to staff with concerns and/or questions. Patient's medication routine continued. Patient's orders and plan of care reviewed.  R: Patient remains cooperative. Will continue to monitor patient q15 minutes for safety.

## 2013-02-14 NOTE — Progress Notes (Addendum)
1:1 Note  Patient currently in group with MHT present. Patient appears in no distress at this time. Will continue to monitor patient for safety.

## 2013-02-14 NOTE — Progress Notes (Addendum)
[facility] PROGRESS NOTE  Jeffrey [name] JXB:[id number] DOB: 10-Nov-1955 DOA: 02/09/2013 PCP: DE LA CRUZ,IVY, DO  Assessment/Plan: Low back pain/Radiculopathy: Chronic - Continue with Gabapentin and tramadol and Oxy for breakthrough pain. -Still with significant amount of pain.  - will change Oxy to 5 mg to every 6 hour PRN. He will need to follow up with PCP to get refill of oxycodone. Per nurse report patient during days does not appears in severe pain.  -SW consulted to help with homelessness.   Hypertension -Continue with metoprolol.  -SBP fluctuates from 102 to 160.   Tobacco abuse: Nicoderm CQ patch.  ETOH abuse: per primary team.  Bipolar disorder: Per primary.  Depression/Suicidal ideation: Per primary.  Homelessness: Child psychotherapist consulted.   HPI/Subjective: Lying in bed, still complaining of significant amount of pain.   Objective: Filed Vitals:   02/13/13 1305 02/13/13 1710 02/14/13 0730 02/14/13 1210  BP: 102/63 146/87 114/76 160/90  Pulse: 92 70 72 64  Temp:  97.3 F (36.3 C) 97.9 F (36.6 C) 97 F (36.1 C)  TempSrc:  Oral Oral Oral  Resp:  18 20 18   Height:      Weight:      SpO2:  98%     No intake or output data in the 24 hours ending 02/14/13 1400 Filed Weights   02/10/13 0100  Weight: 99.791 kg (220 lb)    Exam:   General:  In no acute distress.   Cardiovascular: regular rate and rhythm, without MRG  Respiratory: good air movement, clear to auscultation throughout, no wheezing, ronchi or rales  Abdomen: soft, not tender to palpation, positive bowel sounds  MSK: no peripheral edema  Neuro: non focal exam, could not evaluate LE due to severe pain  Data Reviewed: Basic Metabolic Panel:  Recent Labs Lab 02/07/13 1650 02/08/13 1630  NA 142 143  K 2.9* 3.0*  CL 104 104  CO2 24 23  GLUCOSE 99 91  BUN 12 12  CREATININE 0.80 0.66  CALCIUM 8.3* 8.4   Liver Function Tests:  Recent Labs Lab 02/07/13 1650 02/08/13 1630  AST  36 36  ALT 15 16  ALKPHOS 130* 117  BILITOT 0.2* 0.2*  PROT 6.6 6.6  ALBUMIN 3.1* 3.1*   CBC:  Recent Labs Lab 02/07/13 1650 02/08/13 1630  WBC 5.3 5.1  NEUTROABS 2.7  --   HGB 13.7 13.5  HCT 40.2 41.4  MCV 85.0 85.4  PLT 152 132*   Scheduled Meds: . divalproex  250 mg Oral Q12H  . DULoxetine  30 mg Oral Daily  . folic acid  1 mg Oral Daily  . gabapentin  900 mg Oral TID  . metoprolol succinate  50 mg Oral Daily  . multivitamin with minerals  1 tablet Oral Daily  . potassium chloride  20 mEq Oral BID  . thiamine  100 mg Oral Daily   Time spent: Argentina Ponder, MD [facility] Pager [phone]. If 7 PM - 7 AM, please contact night-coverage at [email or web address], password [facility] 02/14/2013, 2:00 PM  LOS: 5 days

## 2013-02-14 NOTE — Progress Notes (Signed)
D:  Pt was in bed resting. Respirations even and unlabored, no distress noted.  A; Continue 1:1 for safety  R: Pt remains safe.

## 2013-02-14 NOTE — Progress Notes (Signed)
Patient ID: Jeffrey [name], male   DOB: October 08, 1955, 57 y.o.   MRN: [id number]  D: Pt appeared more excited, happier , and more engaging today.  When asked about his day, pt stated, "I got my stuff straightened out, too many dr's. Another dr made it worse. My Dr put it back". States that physical therapy was supposed to come to "rate him" today.  A:  Support and encouragement was offered. 15 min checks continued for safety.  R: Pt remains safe.

## 2013-02-15 DIAGNOSIS — F319 Bipolar disorder, unspecified: Secondary | ICD-10-CM

## 2013-02-15 MED ORDER — TRAMADOL HCL 50 MG PO TABS
50.0000 mg | ORAL_TABLET | Freq: Two times a day (BID) | ORAL | Status: DC | PRN
  Administered 2013-02-15 – 2013-02-26 (×17): 50 mg via ORAL
  Filled 2013-02-15 (×17): qty 1

## 2013-02-15 MED ORDER — DIVALPROEX SODIUM 500 MG PO DR TAB
500.0000 mg | DELAYED_RELEASE_TABLET | Freq: Two times a day (BID) | ORAL | Status: DC
  Administered 2013-02-15 – 2013-02-26 (×22): 500 mg via ORAL
  Filled 2013-02-15 (×4): qty 1
  Filled 2013-02-15: qty 8
  Filled 2013-02-15 (×13): qty 1
  Filled 2013-02-15: qty 8
  Filled 2013-02-15 (×8): qty 1

## 2013-02-15 NOTE — Progress Notes (Signed)
Adult Psychoeducational Group Note  Date:  02/15/2013 Time:  1:22 PM  Group Topic/Focus:  Overcoming Stress:   The focus of this group is to define stress and help patients assess their triggers.  Participation Level:  Did Not Attend  Participation Quality:  did not attend  Affect:  did not attend  Cognitive:  did not attend  Insight: None  Engagement in Group:  None  Modes of Intervention:  Discussion, Education and Support  Additional Comments:  [place] did not attend group.   Nichola Sizer 02/15/2013, 1:22 PM

## 2013-02-15 NOTE — Progress Notes (Signed)
Patient did not attend the evening karaoke group.  

## 2013-02-15 NOTE — B[facility] Group Notes (Signed)
[facility] LCSW Group Therapy  02/15/2013 2:56 PM  Type of Therapy:  Group Therapy  Participation Level:  Did Not Attend  Smart, Herbert Seta 02/15/2013, 2:56 PM

## 2013-02-15 NOTE — Progress Notes (Addendum)
1:1 Note and DAR Note:  D:  Per pt self inventory pt reports sleeping fair, appetite good, energy level low, ability to pay attention poor, rates depression at 10 out of 10 and hopelessness at a 10 out of 10, pt c/o muscle spasms, see pain assessment and MAR for interventions, pt is in room in bed resting at this time, calm at this time.    A:  Emotional support provided, Encouraged pt to continue with treatment plan and attend all group activities, q15 min checks maintained for safety 1:1 continued for safety.  R:  Pt is irritable, short with staff, pt does not like group, went briefly this am and became angry with the SW and came out.

## 2013-02-15 NOTE — Progress Notes (Signed)
1:1 Note-Pt in bed resting, using urinal, pt still endorses SI, verbally contracts for safety at this time.  Pt still c/o some pain, due to receive scheduled pain meds at 12pm today, 1:1 observation continued for safety.

## 2013-02-15 NOTE — Progress Notes (Signed)
[facility] RN from Transitional Care team came to see patient and complete assessment. Patient will be referred to ACT team and transitional care and also given a list of open places that are accepting for housing. Patient given the list as well as LCSW.  Patient reports getting SSI in the amount of 400.00.  Patient currently is not on disability and it is not known at this time if he has applied or would qualify.  Patient is also being seen by hospitalist for pain management.    Will follow up with plan. No DC today due to patient's mood, SI, and plan with belt off of tech. Patient will be re-evaluated tomorrow. Still working on safe DC plan.  Andres Shad, MSW Clinical Lead [phone]

## 2013-02-15 NOTE — Progress Notes (Signed)
Pt came out of group very upset, states "that lady called me out in front of everyone.  She kept prying and trying to get me to talk about my suicidal thoughts.  I told her I didn't want to talk about it in front of everyone!"  Pt appears very angry, upset.  Per sitter pt tried to grab the gait belt from her to try and hurt himself.  Pt very suicidal at this time.  1:1 continued for safety, MD and LCSW notified of pt gesture.

## 2013-02-15 NOTE — Progress Notes (Signed)
Recreation Therapy Notes  Date: 06.12.2014   Time: 3:00pm Location: 300 Hall Dayroom     Group Topic/Focus: Self Esteem  Participation Level: Did not attend.  Marykay Lex Avenir Lozinski, LRT/CTRS  Azriel Jakob L 02/15/2013 5:00 PM

## 2013-02-15 NOTE — Progress Notes (Signed)
1:1 Note-  D:  Per pt self inventory pt reports sleeping poor, appetite poor, energy level low, ability to pay attention poor, did not rate his depression and hopelessness today but states that he feels "very hopeless and helpless because I can't walk anymore.  If I were to fall out of my chair then I wouldn't even be able to get myself up off the ground.  I would just have to wait for someone to come pick my 225 lbs up and put me back in my chair."  Pt became tearful when talking about this.  Endorses SI plan to "drink myself to death" contracts for safety.       A:  Emotional support provided, Encouraged pt to continue with treatment plan and attend all group activities, q15 min checks maintained for safety, 1:1 continued for safety, pain meds given as needed and per orders-see MAR.  R:  Pt is irritable, labile, tearful at times, has been attending groups.

## 2013-02-15 NOTE — BHH Group Notes (Signed)
[facility] LCSW Aftercare Discharge Planning Group Note   02/15/2013 9:33 AM  Participation Quality: Mimimal   Mood/Affect:  Irritable, Angry, Defensive  Depression Rating:  Would not disclose  Anxiety Rating:  Would not disclose  Thoughts of Suicide:  Told me to shut the F Up and walked out Will you contract for safety?   No, made suicidal gesture  Current AVH:  No  Plan for Discharge/Comments:  Pt would like to go to SNF but is having problems due to Medicaid coverage and history of aggression/being kicked out of ALF. Continuing to explore options with patient. Exploring transitional housing options with wheelchair accessibility.   Patient has appointment with Vesta Mixer this morning at 10:00am to discuss any supports with housing and aftercare with transitional care team.  Patient was very upset and angry that he was asked to speak in group today. He was reminded that this was volunteered information that LCSW is trying to gather and obtain, but if he did not want to speak he could request not too. Patient did not refuse speaking, but when asked he became hostile and angry and left group.  Transportation Means: bus/Medicaid transport   Supports: none  Nail, Catalina Gravel

## 2013-02-15 NOTE — Progress Notes (Signed)
D   Pt was pleasant on approach this evening however is prone to have mood lability and irritability at times   He continues to be a high fall risk and needs assistance with all adls and position changes  He has been making most of his position changes when he wants to which is not always compliant with our position change interventions   He seems to have good upper body strength but his legs have a distorted drawn up look to them A   Verbal support given  Medications administered and effectiveness monitored   Pt has a 1:1 sitter at bedside R   Pt safe at present

## 2013-02-15 NOTE — Progress Notes (Signed)
Patient ID: Jeffrey [name], male   DOB: 02-Mar-1956, 57 y.o.   MRN: [id number] 1:1 note: D: Patient remains pleasant and cooperative with staff; no distress noted. A: Administer medications as ordered, encourage staff/peer interaction and group participation. R: No inappropriate behaviors noted.

## 2013-02-15 NOTE — Progress Notes (Signed)
[facility] MD Progress Note  02/15/2013 3:50 PM Jeffrey [name]  MRN:  [id number] Subjective:  Jeffrey [name] is still experiencing depression, mood instability and persistent pain. States that he gets frustrated. He was more hopeful this AM as someone came from transitional housing and told him about options Diagnosis:  Alcohol Dependence Bipolar Disorder  ADL's:  Impaired  Sleep: Fair  Appetite:  Fair  Suicidal Ideation:  Plan:  denies Intent:  denies Means:  denies Homicidal Ideation:  Plan:  denies Intent:  denies Means:  denies AEB (as evidenced by):  Psychiatric Specialty Exam: Review of Systems  Constitutional: Negative.   HENT: Negative.   Eyes: Negative.   Respiratory: Negative.   Cardiovascular: Negative.   Gastrointestinal: Negative.   Genitourinary: Negative.   Musculoskeletal: Positive for back pain and joint pain.  Skin: Negative.   Neurological: Negative.   Endo/Heme/Allergies: Negative.   Psychiatric/Behavioral: Positive for depression, suicidal ideas and substance abuse. The patient is nervous/anxious.     Blood pressure 153/101, pulse 65, temperature 99 F (37.2 C), temperature source Oral, resp. rate 16, height 6\' 4"  (1.93 m), weight 99.791 kg (220 lb), SpO2 98.00%.Body mass index is 26.79 kg/(m^2).  General Appearance: Disheveled  Eye Solicitor::  Fair  Speech:  Clear and Coherent  Volume:  fluctuates  Mood:  Anxious, Depressed and Irritable  Affect:  anxious, depressed, irritated  Thought Process:  Coherent and Goal Directed  Orientation:  Full (Time, Place, and Person)  Thought Content:  worries, concerns  Suicidal Thoughts:  Yes.  without intent/plan  Homicidal Thoughts:  No  Memory:  Immediate;   Fair Recent;   Fair Remote;   Fair  Judgement:  Fair  Insight:  Shallow  Psychomotor Activity:  Restlessness  Concentration:  Fair  Recall:  Fair  Akathisia:  No  Handed:  Right  AIMS (if indicated):     Assets:  Desire for Improvement  Sleep:  Number of  Hours: 6.75   Current Medications: Current Facility-Administered Medications  Medication Dose Route Frequency Provider Last Rate Last Dose  . acetaminophen (TYLENOL) tablet 650 mg  650 mg Oral Q6H PRN Earney Navy, NP   650 mg at 02/15/13 1041  . alum & mag hydroxide-simeth (MAALOX/MYLANTA) 200-200-20 MG/5ML suspension 30 mL  30 mL Oral Q4H PRN Earney Navy, NP      . chlordiazePOXIDE (LIBRIUM) capsule 25 mg  25 mg Oral Q6H PRN Kerry Hough, PA-C   25 mg at 02/13/13 2149  . divalproex (DEPAKOTE) DR tablet 250 mg  250 mg Oral Q12H Earney Navy, NP   250 mg at 02/15/13 0810  . DULoxetine (CYMBALTA) DR capsule 60 mg  60 mg Oral Daily Rachael Fee, MD   60 mg at 02/15/13 0807  . folic acid (FOLVITE) tablet 1 mg  1 mg Oral Daily Earney Navy, NP   1 mg at 02/15/13 0807  . gabapentin (NEURONTIN) capsule 900 mg  900 mg Oral TID Pamella Pert, MD   900 mg at 02/15/13 1209  . magnesium hydroxide (MILK OF MAGNESIA) suspension 30 mL  30 mL Oral Daily PRN Earney Navy, NP      . metoprolol succinate (TOPROL-XL) 24 hr tablet 50 mg  50 mg Oral Daily Pamella Pert, MD   50 mg at 02/15/13 0807  . multivitamin with minerals tablet 1 tablet  1 tablet Oral Daily Earney Navy, NP   1 tablet at 02/15/13 0807  . nicotine polacrilex (NICORETTE) gum 2 mg  2 mg Oral PRN Rachael Fee, MD   2 mg at 02/15/13 1302  . oxyCODONE (Oxy IR/ROXICODONE) immediate release tablet 10 mg  10 mg Oral TID Rachael Fee, MD   10 mg at 02/15/13 1258  . potassium chloride SA (K-DUR,KLOR-CON) CR tablet 20 mEq  20 mEq Oral BID Fransisca Kaufmann, NP   20 mEq at 02/15/13 0807  . thiamine (VITAMIN B-1) tablet 100 mg  100 mg Oral Daily Earney Navy, NP   100 mg at 02/15/13 0807  . tiZANidine (ZANAFLEX) tablet 4 mg  4 mg Oral Q8H PRN Pamella Pert, MD   4 mg at 02/15/13 1302  . traMADol (ULTRAM) tablet 50 mg  50 mg Oral BID PRN Belkys A Regalado, MD      . traZODone (DESYREL) tablet 50 mg  50 mg Oral  QHS PRN,MR X 1 Earney Navy, NP   50 mg at 02/14/13 2201    Lab Results: No results found for this or any previous visit (from the past 48 hour(s)).  Physical Findings: AIMS: Facial and Oral Movements Muscles of Facial Expression: None, normal Lips and Perioral Area: None, normal Jaw: None, normal Tongue: None, normal,Extremity Movements Upper (arms, wrists, hands, fingers): None, normal Lower (legs, knees, ankles, toes): None, normal, Trunk Movements Neck, shoulders, hips: None, normal, Overall Severity Severity of abnormal movements (highest score from questions above): None, normal Incapacitation due to abnormal movements: None, normal Patient's awareness of abnormal movements (rate only patient's report): No Awareness, Dental Status Current problems with teeth and/or dentures?: No Does patient usually wear dentures?: No  CIWA:  CIWA-Ar Total: 0 COWS:  COWS Total Score: 5  Treatment Plan Summary: Daily contact with patient to assess and evaluate symptoms and progress in treatment Medication management  Plan: Supportive approach/coping skills/relapse prevention           Increase the Depakote to 500 mg BID           Continue to work on placement  Medical Decision Making Problem Points:  Review of last therapy session (1) and Review of psycho-social stressors (1) Data Points:  Review of medication regiment & side effects (2) Review of new medications or change in dosage (2)  I certify that inpatient services furnished can reasonably be expected to improve the patient's condition.   Samhita Kretsch A 02/15/2013, 3:50 PM

## 2013-02-15 NOTE — Progress Notes (Signed)
Patient ID: Jeffrey [name], male   DOB: 1956-02-15, 57 y.o.   MRN: [id number]  1:1 Note: Patient resting in bed with no complaints at this time. Pt with no distress currently. Pt pleasant but withdrawn; pt with passive si.

## 2013-02-15 NOTE — Progress Notes (Addendum)
[facility] PROGRESS NOTE  Dellis Voght ZOX:[id number] DOB: [birth year] DOA: 02/09/2013 PCP: DE LA CRUZ,IVY, DO  Assessment/Plan: Low back pain/Radiculopathy: Chronic - Continue with Gabapentin and Oxy for breakthrough pain. -Still with significant amount of pain today, will add tramadol PRN. -SW helping with safety discharge plan.   Hypertension -Continue with metoprolol.  -SBP fluctuates from 102 to 160.    Hypokalemia: repeat B-met in am.   Tobacco abuse: Nicoderm CQ patch.  ETOH abuse: per primary team.  Bipolar disorder: Per primary.  Depression/Suicidal ideation: Per primary.  Homelessness: Child psychotherapist consulted.   HPI/Subjective: Lying in bed, moaning due to pain. He feels that 10 oxycodone every 8 hour work better than 5 mg every 6 hour.  Denies constipation.   Objective: Filed Vitals:   02/14/13 1210 02/14/13 1711 02/15/13 0600 02/15/13 1208  BP: 160/90 162/104 149/85 153/101  Pulse: 64 70 62 65  Temp: 97 F (36.1 C)  97.4 F (36.3 C) 99 F (37.2 C)  TempSrc: Oral  Oral Oral  Resp: 18 18 18 16   Height:      Weight:      SpO2:       No intake or output data in the 24 hours ending 02/15/13 1337 Filed Weights   02/10/13 0100  Weight: 99.791 kg (220 lb)    Exam:   General:  In no acute distress.   Cardiovascular: regular rate and rhythm, without MRG  Respiratory: good air movement, clear to auscultation throughout, no wheezing, ronchi or rales  Abdomen: soft, not tender to palpation, positive bowel sounds  MSK: no peripheral edema  Neuro: non focal exam, could not evaluate LE due to severe pain  Data Reviewed: Basic Metabolic Panel:  Recent Labs Lab 02/08/13 1630  NA 143  K 3.0*  CL 104  CO2 23  GLUCOSE 91  BUN 12  CREATININE 0.66  CALCIUM 8.4   Liver Function Tests:  Recent Labs Lab 02/08/13 1630  AST 36  ALT 16  ALKPHOS 117  BILITOT 0.2*  PROT 6.6  ALBUMIN 3.1*   CBC:  Recent Labs Lab 02/08/13 1630  WBC 5.1   HGB 13.5  HCT 41.4  MCV 85.4  PLT 132*   Scheduled Meds: . divalproex  250 mg Oral Q12H  . DULoxetine  60 mg Oral Daily  . folic acid  1 mg Oral Daily  . gabapentin  900 mg Oral TID  . metoprolol succinate  50 mg Oral Daily  . multivitamin with minerals  1 tablet Oral Daily  . oxyCODONE  10 mg Oral TID  . potassium chloride  20 mEq Oral BID  . thiamine  100 mg Oral Daily   Time spent: 15  Clair Alfieri, MD [facility] Pager [phone]. If 7 PM - 7 AM, please contact night-coverage at [email or web address], password [facility] 02/15/2013, 1:37 PM  LOS: 6 days

## 2013-02-16 LAB — BASIC METABOLIC PANEL
BUN: 18 mg/dL (ref 6–23)
Chloride: 100 mEq/L (ref 96–112)
Creatinine, Ser: 0.83 mg/dL (ref 0.50–1.35)
GFR calc Af Amer: >90 mL/min (ref >90)
GFR calc non Af Amer: >90 mL/min (ref >90)
Glucose, Bld: 86 mg/dL (ref 70–99)

## 2013-02-16 NOTE — Tx Team (Signed)
Interdisciplinary Treatment Plan Update (Adult)  Date: 02/16/2013   Time Reviewed: 10:33 AM   Progress in Treatment:  Attending groups: No Participating in groups: No Taking medication as prescribed: Yes  Tolerating medication: Yes  Family/Significant othe contact made: No  Patient understands diagnosis: Yes, AEB seeking help with substance abuse and depression Discussing patient identified problems/goals with staff: Intermittently  Medical problems stabilized or resolved: Yes  Denies suicidal/homicidal ideation: Yes  Patient has not harmed self or Others: Yes  New problem(s) identified: Pain management is an issue for this patient.  Discharge Plan or Barriers: Set up through [facility] for transitional care. Currently exploring wheelchair accessible shelters in [place] area. Pt given information to [facility], surrounding shelters. Pt not attending groups and currently unwilling to work with team in order to formulate discharge plan.  Additional comments: N/A  Reason for Continuation of Hospitalization:  Depression  Medication managment  Estimated length of stay: 1 day  For review of initial/current patient goals, please see plan of care.  Attendees:  Patient:    Family:    Physician: Geoffery Lyons  10:36 AM 02/16/2013   Nursing: Philippa Chester RN 10:36 AM 02/16/2013   Clinical Social Worker [employer], LCSWA  10:37 AM 02/16/2013   Other: Idelia Salm RN  10:37 AM 02/16/2013   Other:    Other:     Other:    Scribe for Treatment Team:  Trula Slade LCSWA, 02/16/2013 10:37 AM

## 2013-02-16 NOTE — BHH Group Notes (Signed)
[facility] LCSW Aftercare Discharge Planning Group Note   02/16/2013 9:49 AM  Participation Quality:  DID NOT ATTEND  Smart, Jeffrey [name]

## 2013-02-16 NOTE — [facility] Group Notes (Signed)
[facility] LCSW Group Therapy  02/16/2013 4:22 PM  Type of Therapy:  Group Therapy  Participation Level:  Active  Participation Quality:  Appropriate  Affect:  Appropriate  Cognitive:  Appropriate  Insight:  Improving  Engagement in Therapy:  Improving  Modes of Intervention:  Discussion, Education, Exploration, Socialization and Support  Summary of Progress/Problems: Topic: Feelings around Relapse. Group members discussed the meaning of relapse and shared personal stories of relapse, how it affected them and others, and how they perceived themselves during this time. Group members were encouraged to identify triggers, warning signs and coping skills used when facing the possibility of relapse. Social supports were discussed and explored in detail. Post Acute Withdrawal Syndrome (handout provided) was introduced and examined. Pt's were encouraged to ask questions, talk about key points associated with PAWS, and process this information in terms of relapse prevention. Jeffrey [name] talked about his most recent relapse and how he uses alcohol/listerine in order to decrease the high level of pain he feels when he is unable to access medications. Jeffrey [name] processed how not knowing about PAWS contributed to his multiple relapses and explored ways to cope with PAWS in the future. He stated that he feels more hopeful today due to [facility] assistance with Transitional Care and in getting him help with more permanent placement.   Smart, [name] 02/16/2013, 4:22 PM

## 2013-02-16 NOTE — Progress Notes (Signed)
1:1 note  -- D.  Pt up and sitting in dayroom, improved grooming.  Appears in pleasant mood, joking with staff.  Complaint of continued pain but is happier with current medications that was previously.  Positive for evening AA group with appropriate participation.  Interacting appropriately on unit this evening.  Passive SI at times but contracts for safety on unit.  A.  Support and encouragement offered  1:1 continued as ordered for Pt safety  R.  Pt remains safe

## 2013-02-16 NOTE — Progress Notes (Signed)
D: Patient denies SI/HI and auditory and visual hallucinations. The patient has a depressed mood and affect. The patient rates his hopelessness an 8 out of 10 and writes that his depression fluctuates between a 1 and a 10 (1 low/10 high). The patient reports sleeping fairly well and writes that his appetite is good and that his energy level is low. The patient states that he is still experiencing some agitation and cravings for alcohol but reports that he is "feeling better than a few days ago." The patient is complaining of leg pain but reports that this pain is his "base line."  A: Patient given emotional support from RN. Patient encouraged to come to staff with concerns and/or questions. Patient's medication routine continued. Patient's orders and plan of care reviewed. Patient given PRN medication for pain.   R: Patient remains cooperative. 1:1 maintained for patient safety. Will continue to monitor patient q15 minutes for safety.

## 2013-02-16 NOTE — Progress Notes (Signed)
The patient attended the A.A. Meeting on the 300 hallway this evening.  

## 2013-02-16 NOTE — Progress Notes (Signed)
1:1 Note  Patient currently with MHT in room resting. Patient appears in no distress. Will continue to monitor patient for safety.

## 2013-02-16 NOTE — Progress Notes (Signed)
[facility] PROGRESS NOTE  Jeffrey [name] ZOX:[id number] DOB: 12-04-1955 DOA: 02/09/2013 PCP: DE LA CRUZ,IVY, DO  Assessment/Plan: Low back pain/Radiculopathy: Chronic - Continue with Gabapentin and Oxy for breakthrough pain. -Pain better with  tramadol PRN. -SW helping with safety discharge plan.  -Please call with question, I will sign off.   Hypertension -Continue with metoprolol.  -SBP fluctuates from 102 to 160.  -If BP continue to be elevated, consider start norvasc 2.5 mg daily.    Hypokalemia: repeat B-met in am.   Tobacco abuse: Nicoderm CQ patch.  ETOH abuse: per primary team.  Bipolar disorder: Per primary.  Depression/Suicidal ideation: Per primary.  Homelessness: Child psychotherapist consulted.   HPI/Subjective: Pain improved.   Objective: Filed Vitals:   02/15/13 0600 02/15/13 1208 02/16/13 0757 02/16/13 1100  BP: 149/85 153/101 141/89 180/89  Pulse: 62 65 63 66  Temp: 97.4 F (36.3 C) 99 F (37.2 C)    TempSrc: Oral Oral    Resp: 18 16 18    Height:      Weight:      SpO2:       No intake or output data in the 24 hours ending 02/16/13 1708 Filed Weights   02/10/13 0100  Weight: 99.791 kg (220 lb)    Exam:   General:  In no acute distress.   Cardiovascular: regular rate and rhythm, without MRG  Respiratory: good air movement, clear to auscultation throughout, no wheezing, ronchi or rales  Abdomen: soft, not tender to palpation, positive bowel sounds  MSK: no peripheral edema  Neuro: non focal exam, could not evaluate LE due to severe pain  Data Reviewed: Basic Metabolic Panel:  Recent Labs Lab 02/16/13 0625  NA 138  K 4.3  CL 100  CO2 28  GLUCOSE 86  BUN 18  CREATININE 0.83  CALCIUM 9.6   Liver Function Tests: No results found for this basename: AST, ALT, ALKPHOS, BILITOT, PROT, ALBUMIN,  in the last 168 hours CBC: No results found for this basename: WBC, NEUTROABS, HGB, HCT, MCV, PLT,  in the last 168 hours Scheduled  Meds: . divalproex  500 mg Oral Q12H  . DULoxetine  60 mg Oral Daily  . folic acid  1 mg Oral Daily  . gabapentin  900 mg Oral TID  . metoprolol succinate  50 mg Oral Daily  . multivitamin with minerals  1 tablet Oral Daily  . oxyCODONE  10 mg Oral TID  . potassium chloride  20 mEq Oral BID  . thiamine  100 mg Oral Daily   Time spent: 15  Jeffrey Guthridge, MD [facility] Pager [phone]. If 7 PM - 7 AM, please contact night-coverage at [email or web address], password [facility] 02/16/2013, 5:08 PM  LOS: 7 days

## 2013-02-16 NOTE — Progress Notes (Signed)
1:1 Note  Patient currently in room resting with MHT present. Patient currently denies any questions or concerns at this time. Will continue to monitor patient for safety.

## 2013-02-16 NOTE — Progress Notes (Signed)
[facility] MD Progress Note  02/16/2013 3:54 PM Jeffrey [name]  MRN:  [id number] Subjective:  Jeffrey [name] states that he is still dealing with pain, but that he is a little more hopeful that he is going to find some place to go. Gets down depressed suicidal when he feels that he has no options Diagnosis:  Alcohol Dependence, Bipolar Disorder  ADL's:  Impaired  Sleep: Fair  Appetite:  Fair  Suicidal Ideation:  Plan:  denies Intent:  denies Means:  denies Homicidal Ideation:  Plan:  denies Intent:  denies Means:  denies AEB (as evidenced by):  Psychiatric Specialty Exam: Review of Systems  Constitutional: Negative.   HENT: Negative.   Eyes: Negative.   Respiratory: Negative.   Cardiovascular: Negative.   Gastrointestinal: Negative.   Genitourinary: Negative.   Musculoskeletal: Positive for back pain and joint pain.  Skin: Negative.   Neurological: Negative.   Endo/Heme/Allergies: Negative.   Psychiatric/Behavioral: Positive for depression and substance abuse. The patient is nervous/anxious.     Blood pressure 180/89, pulse 66, temperature 99 F (37.2 C), temperature source Oral, resp. rate 18, height 6\' 4"  (1.93 m), weight 99.791 kg (220 lb), SpO2 98.00%.Body mass index is 26.79 kg/(m^2).  General Appearance: Disheveled  Eye Solicitor::  Fair  Speech:  Clear and Coherent  Volume:  Decreased  Mood:  worried, in pain  Affect:  Restricted  Thought Process:  Coherent and Goal Directed  Orientation:  Full (Time, Place, and Person)  Thought Content:  somatic complains, worries, concerns  Suicidal Thoughts: when he thinks he has no hope  Homicidal Thoughts:  No  Memory:  Immediate;   Fair Recent;   Fair Remote;   Fair  Judgement:  Fair  Insight:  Present and Shallow  Psychomotor Activity:  Restlessness  Concentration:  Fair  Recall:  Fair  Akathisia:  No  Handed:  Right  AIMS (if indicated):     Assets:  Desire for Improvement  Sleep:  Number of Hours: 4.75   Current  Medications: Current Facility-Administered Medications  Medication Dose Route Frequency Provider Last Rate Last Dose  . acetaminophen (TYLENOL) tablet 650 mg  650 mg Oral Q6H PRN Earney Navy, NP   650 mg at 02/15/13 1041  . alum & mag hydroxide-simeth (MAALOX/MYLANTA) 200-200-20 MG/5ML suspension 30 mL  30 mL Oral Q4H PRN Earney Navy, NP      . chlordiazePOXIDE (LIBRIUM) capsule 25 mg  25 mg Oral Q6H PRN Kerry Hough, PA-C   25 mg at 02/13/13 2149  . divalproex (DEPAKOTE) DR tablet 500 mg  500 mg Oral Q12H Rachael Fee, MD   500 mg at 02/16/13 0758  . DULoxetine (CYMBALTA) DR capsule 60 mg  60 mg Oral Daily Rachael Fee, MD   60 mg at 02/16/13 0757  . folic acid (FOLVITE) tablet 1 mg  1 mg Oral Daily Earney Navy, NP   1 mg at 02/16/13 0758  . gabapentin (NEURONTIN) capsule 900 mg  900 mg Oral TID Pamella Pert, MD   900 mg at 02/16/13 1311  . magnesium hydroxide (MILK OF MAGNESIA) suspension 30 mL  30 mL Oral Daily PRN Earney Navy, NP      . metoprolol succinate (TOPROL-XL) 24 hr tablet 50 mg  50 mg Oral Daily Pamella Pert, MD   50 mg at 02/16/13 0758  . multivitamin with minerals tablet 1 tablet  1 tablet Oral Daily Earney Navy, NP   1 tablet at 02/16/13 0757  .  nicotine polacrilex (NICORETTE) gum 2 mg  2 mg Oral PRN Rachael Fee, MD   2 mg at 02/16/13 0800  . oxyCODONE (Oxy IR/ROXICODONE) immediate release tablet 10 mg  10 mg Oral TID Rachael Fee, MD   10 mg at 02/16/13 1311  . potassium chloride SA (K-DUR,KLOR-CON) CR tablet 20 mEq  20 mEq Oral BID Fransisca Kaufmann, NP   20 mEq at 02/16/13 0757  . thiamine (VITAMIN B-1) tablet 100 mg  100 mg Oral Daily Earney Navy, NP   100 mg at 02/16/13 0758  . tiZANidine (ZANAFLEX) tablet 4 mg  4 mg Oral Q8H PRN Pamella Pert, MD   4 mg at 02/16/13 1500  . traMADol (ULTRAM) tablet 50 mg  50 mg Oral BID PRN Belkys A Regalado, MD   50 mg at 02/16/13 0800  . traZODone (DESYREL) tablet 50 mg  50 mg Oral QHS PRN,MR  X 1 Earney Navy, NP   50 mg at 02/14/13 2201    Lab Results:  Results for orders placed during the hospital encounter of 02/09/13 (from the past 48 hour(s))  BASIC METABOLIC PANEL     Status: None   Collection Time    02/16/13  6:25 AM      Result Value Range   Sodium 138  135 - 145 mEq/L   Potassium 4.3  3.5 - 5.1 mEq/L   Chloride 100  96 - 112 mEq/L   CO2 28  19 - 32 mEq/L   Glucose, Bld 86  70 - 99 mg/dL   BUN 18  6 - 23 mg/dL   Creatinine, Ser 8.11  0.50 - 1.35 mg/dL   Calcium 9.6  8.4 - 91.4 mg/dL   GFR calc non Af Amer >90  >90 mL/min   GFR calc Af Amer >90  >90 mL/min   Comment:            The eGFR has been calculated     using the CKD EPI equation.     This calculation has not been     validated in all clinical     situations.     eGFR's persistently     <90 mL/min signify     possible Chronic Kidney Disease.    Physical Findings: AIMS: Facial and Oral Movements Muscles of Facial Expression: None, normal Lips and Perioral Area: None, normal Jaw: None, normal Tongue: None, normal,Extremity Movements Upper (arms, wrists, hands, fingers): None, normal Lower (legs, knees, ankles, toes): None, normal, Trunk Movements Neck, shoulders, hips: None, normal, Overall Severity Severity of abnormal movements (highest score from questions above): None, normal Incapacitation due to abnormal movements: None, normal Patient's awareness of abnormal movements (rate only patient's report): No Awareness, Dental Status Current problems with teeth and/or dentures?: No Does patient usually wear dentures?: No  CIWA:  CIWA-Ar Total: 0 COWS:  COWS Total Score: 5  Treatment Plan Summary: Daily contact with patient to assess and evaluate symptoms and progress in treatment Medication management  Plan: Supportive approach/coping skills/relapse prevention           Optimize treatment for his mood disorder and pain  Medical Decision Making Problem Points:  Review of last therapy  session (1) and Review of psycho-social stressors (1) Data Points:  Review of medication regiment & side effects (2) Review of new medications or change in dosage (2)  I certify that inpatient services furnished can reasonably be expected to improve the patient's condition.   Ryane Konieczny A 02/16/2013, 3:54  PM

## 2013-02-16 NOTE — Progress Notes (Signed)
Adult Psychoeducational Group Note  Date:  02/16/2013 Time:  1:14 PM  Group Topic/Focus:  Relapse Prevention Planning:   The focus of this group is to define relapse and discuss the need for planning to combat relapse.  Participation Level:  Did Not Attend  Participation Quality:  Did not Attend  Affect:  Did not attend  Cognitive:  Did not attend  Insight: None  Engagement in Group:  None  Modes of Intervention:  Did not attend  Additional Comments:  Pt was in bed during group   Reynolds Bowl 02/16/2013, 1:14 PM

## 2013-02-16 NOTE — Progress Notes (Signed)
D   Pt has been resting in his bed with no complaints or request for more pain medications  He is pleasant on approach   He has unsteady gait and his legs are twisted   He is wheelchair bound and needs assistance with ADLs A   Pt on 1:1  R   Pt is safe at present

## 2013-02-16 NOTE — Progress Notes (Signed)
D   Pt has been in bed resting  He continues to be a high fall risk due to his medical condition  A   Pt on 1:1  R   Safe at present

## 2013-02-16 NOTE — Progress Notes (Signed)
1:1 Note  Patient currently in group with MHT present. Patient in no apparent distress. Will continue to monitor patient for safety.

## 2013-02-17 NOTE — Progress Notes (Signed)
1:1 note --D.  Pt has been up and interacting tonight on the unit, appropriate with peers.  Much brighter and pleasant tonight.  Positive for evening wrap up group.  Requested pain medication that he has available prn, and while it helps he still looked forward to when he could get his scheduled pain medication.  Pt states pain does not ever get below a 4.  A.  1:1 continued as it is ordered due to Pt safety and assistance with ADLs.  R.  Pt remains safe on unit, will continue to monitor.

## 2013-02-17 NOTE — Progress Notes (Signed)
Pt is a high fall risk and is on 1:1 for safety  He is presently safe

## 2013-02-17 NOTE — Progress Notes (Signed)
[facility] MD Progress Note  02/17/2013 11:15 AM Jeffrey [name]  MRN:  [id number] Subjective:  Been reports that he continues to experience back pain secondary to degenerative disc disease in most of his lumbar joints. He endorses a labile mood and states that his depression is currently a 7 on a scale of 1-10, where 10 is the worst, but ranges anywhere between a 2 and 10 at any moment. He also reports that his anxiety is up and down, and currently reports that it as a 5 on a scale of 1-10. He is currently denying any suicidal or homicidal ideation. He denies any auditory or visual hallucinations. He reports that he slept well last night, and that his appetite is good. He denies any cravings for any substances, and adamantly reports that when he uses it is for pain relief. He denies any withdrawal symptoms.  Diagnosis:   Axis I: Alcohol Dependence, Bipolar Disorder Axis II: Deferred Axis III:  Past Medical History  Diagnosis Date  . DDD (degenerative disc disease)   . Necrotizing fasciitis   . Degenerative disc disease   . Hypertension   . Degenerative disc disease   . Hepatitis C   . Chronic pain   . DDD (degenerative disc disease)   . Parkinson disease   . Mental disorder   . Depression   . DDD (degenerative disc disease)   . DDD (degenerative disc disease), lumbar     ADL's:  Impaired  Sleep: Good  Appetite:  Good  Suicidal Ideation:  Patient denies any thought, plan, or intent Homicidal Ideation:  Patient denies any thought, plan, or intent AEB (as evidenced by):  Psychiatric Specialty Exam: Review of Systems  Constitutional: Negative.   HENT: Negative.   Eyes: Negative.   Respiratory: Negative.   Cardiovascular: Negative.   Gastrointestinal: Negative.   Genitourinary: Negative.   Musculoskeletal: Positive for back pain.  Skin: Negative.   Neurological: Negative.   Endo/Heme/Allergies: Negative.   Psychiatric/Behavioral: Positive for depression and substance abuse.  Negative for suicidal ideas and hallucinations. The patient is nervous/anxious. The patient does not have insomnia.     Blood pressure 131/84, pulse 55, temperature 97.6 F (36.4 C), temperature source Oral, resp. rate 16, height 6\' 4"  (1.93 m), weight 99.791 kg (220 lb), SpO2 98.00%.Body mass index is 26.79 kg/(m^2).  General Appearance: Casual  Eye Contact::  Fair  Speech:  Clear and Coherent  Volume:  Normal  Mood:  Irritable  Affect:  Congruent  Thought Process:  Linear  Orientation:  Full (Time, Place, and Person)  Thought Content:  WDL  Suicidal Thoughts:  No  Homicidal Thoughts:  No  Memory:  Immediate;   Good Recent;   Good Remote;   Good  Judgement:  Fair  Insight:  Lacking  Psychomotor Activity:  Normal  Concentration:  Good  Recall:  Good  Akathisia:  No  Handed:  Right  AIMS (if indicated):     Assets:  Communication Skills Desire for Improvement Resilience  Sleep:  Number of Hours: 6   Current Medications: Current Facility-Administered Medications  Medication Dose Route Frequency Provider Last Rate Last Dose  . acetaminophen (TYLENOL) tablet 650 mg  650 mg Oral Q6H PRN Earney Navy, NP   650 mg at 02/15/13 1041  . alum & mag hydroxide-simeth (MAALOX/MYLANTA) 200-200-20 MG/5ML suspension 30 mL  30 mL Oral Q4H PRN Earney Navy, NP      . chlordiazePOXIDE (LIBRIUM) capsule 25 mg  25 mg Oral Q6H PRN Karleen Hampshire  E Simon, PA-C   25 mg at 02/17/13 1610  . divalproex (DEPAKOTE) DR tablet 500 mg  500 mg Oral Q12H Rachael Fee, MD   500 mg at 02/17/13 0749  . DULoxetine (CYMBALTA) DR capsule 60 mg  60 mg Oral Daily Rachael Fee, MD   60 mg at 02/17/13 0749  . folic acid (FOLVITE) tablet 1 mg  1 mg Oral Daily Earney Navy, NP   1 mg at 02/17/13 0748  . gabapentin (NEURONTIN) capsule 900 mg  900 mg Oral TID Pamella Pert, MD   900 mg at 02/17/13 0748  . magnesium hydroxide (MILK OF MAGNESIA) suspension 30 mL  30 mL Oral Daily PRN Earney Navy, NP       . metoprolol succinate (TOPROL-XL) 24 hr tablet 50 mg  50 mg Oral Daily Pamella Pert, MD   50 mg at 02/17/13 0749  . multivitamin with minerals tablet 1 tablet  1 tablet Oral Daily Earney Navy, NP   1 tablet at 02/17/13 0748  . nicotine polacrilex (NICORETTE) gum 2 mg  2 mg Oral PRN Rachael Fee, MD   2 mg at 02/17/13 0749  . oxyCODONE (Oxy IR/ROXICODONE) immediate release tablet 10 mg  10 mg Oral TID Rachael Fee, MD   10 mg at 02/17/13 9604  . potassium chloride SA (K-DUR,KLOR-CON) CR tablet 20 mEq  20 mEq Oral BID Fransisca Kaufmann, NP   20 mEq at 02/17/13 0748  . thiamine (VITAMIN B-1) tablet 100 mg  100 mg Oral Daily Earney Navy, NP   100 mg at 02/17/13 0749  . tiZANidine (ZANAFLEX) tablet 4 mg  4 mg Oral Q8H PRN Pamella Pert, MD   4 mg at 02/17/13 0648  . traMADol (ULTRAM) tablet 50 mg  50 mg Oral BID PRN Belkys A Regalado, MD   50 mg at 02/17/13 0749  . traZODone (DESYREL) tablet 50 mg  50 mg Oral QHS PRN,MR X 1 Earney Navy, NP   50 mg at 02/16/13 2226    Lab Results:  Results for orders placed during the hospital encounter of 02/09/13 (from the past 48 hour(s))  BASIC METABOLIC PANEL     Status: None   Collection Time    02/16/13  6:25 AM      Result Value Range   Sodium 138  135 - 145 mEq/L   Potassium 4.3  3.5 - 5.1 mEq/L   Chloride 100  96 - 112 mEq/L   CO2 28  19 - 32 mEq/L   Glucose, Bld 86  70 - 99 mg/dL   BUN 18  6 - 23 mg/dL   Creatinine, Ser 5.40  0.50 - 1.35 mg/dL   Calcium 9.6  8.4 - 98.1 mg/dL   GFR calc non Af Amer >90  >90 mL/min   GFR calc Af Amer >90  >90 mL/min   Comment:            The eGFR has been calculated     using the CKD EPI equation.     This calculation has not been     validated in all clinical     situations.     eGFR's persistently     <90 mL/min signify     possible Chronic Kidney Disease.    Physical Findings: AIMS: Facial and Oral Movements Muscles of Facial Expression: None, normal Lips and Perioral Area: None,  normal Jaw: None, normal Tongue: None, normal,Extremity Movements Upper (arms, wrists, hands,  fingers): None, normal Lower (legs, knees, ankles, toes): None, normal, Trunk Movements Neck, shoulders, hips: None, normal, Overall Severity Severity of abnormal movements (highest score from questions above): None, normal Incapacitation due to abnormal movements: None, normal Patient's awareness of abnormal movements (rate only patient's report): No Awareness, Dental Status Current problems with teeth and/or dentures?: No Does patient usually wear dentures?: No  CIWA:  CIWA-Ar Total: 0 COWS:  COWS Total Score: 5  Treatment Plan Summary: Daily contact with patient to assess and evaluate symptoms and progress in treatment Medication management  Plan:  Medical Decision Making Problem Points:  Established problem, stable/improving (1) and Review of psycho-social stressors (1) Data Points:  Review or order clinical lab tests (1) Review of medication regiment & side effects (2)  I certify that inpatient services furnished can reasonably be expected to improve the patient's condition.   WATT,ALAN 02/17/2013, 11:15 AM   Reviewed the note will continue current medications.  Jacqulyn Cane, M.D.  02/17/2013 9:52 PM

## 2013-02-17 NOTE — Progress Notes (Signed)
D   Pt has been up and about the unit this morning  He is using the wheel chair   He is irritable at times  He frequently asks for medications and nicorette gum  He continues to be a high fall risk due to the constriction of his legs and the fact that he has no use of the leg  A   Pt on 1:1 R   Safe at present

## 2013-02-17 NOTE — Progress Notes (Signed)
1:1 note --  Pt resting in bed with eyes closed, respirations even and unlabored.  No signs or symptoms of distress noted.  A.  1:1 continued as ordered for Pt safety  R.  Pt remains safe resting in bed, MHT at bedside.  Will continue to monitor.

## 2013-02-17 NOTE — Progress Notes (Signed)
1:1  D.  Pt has rested well this shift, no distress noted, respirations even and unlabored.  No complaints voiced.  A.  1:1 continued as ordered for Pt safety.  R.  Pt has remained safe on unit

## 2013-02-17 NOTE — Progress Notes (Signed)
Pt continues to be a high fall risk and needs assistance with adls and ambulation and position changes   Pt on 1:1  Safe at present

## 2013-02-17 NOTE — [facility] Group Notes (Signed)
[facility] LCSW Group Therapy  Stages of Change  02/17/2013 11:07 AM  Type of Therapy:  Group Therapy  Participation Level:  Minimal  Participation Quality:  Appropriate  Affect:  Appropriate  Cognitive:  Appropriate  Insight:  Developing  Engagement in Therapy: Developing  Modes of Intervention:  Discussion, Education, Exploration, Problem-Solving, Rapport Building, Support  Summary of Progress/Problems: The topic for today was the stages of changes.  Patient were asked to identify changes they need to make and their motivation and commitment to making changes in their lives.  Patient did not participate in discussion.    Wynn Banker 02/17/2013, 11:07 AM

## 2013-02-17 NOTE — Progress Notes (Signed)
Psychoeducational Group Note  Date:  02/17/2013 Time:  0945 am  Group Topic/Focus:  Identifying Needs:   The focus of this group is to help patients identify their personal needs that have been historically problematic and identify healthy behaviors to address their needs.  Participation Level:  Active  Participation Quality:  Appropriate  Affect:  Appropriate  Cognitive:  Alert  Insight:  Improving  Engagement in Group:  Engaged  Additional Comments:    Andrena Mews 02/17/2013,2:30 PM

## 2013-02-17 NOTE — Progress Notes (Signed)
Adult Psychoeducational Group Note  Date:  02/17/2013 Time:  0900  Group Topic/Focus: Healthy Coping Skills   Participation Level:  None  Participation Quality:  Inattentive  Affect:  Blunted  Cognitive:  Lacking  Insight: Lacking  Engagement in Group:  None  Modes of Intervention:  Clarification, Discussion and Education  Additional Comments:  Pt is preoccupied and unfocused during group.  Ranger Petrich Shari Prows 02/17/2013, 10:34 AM

## 2013-02-18 NOTE — Progress Notes (Signed)
D   Pt continues to be in need of a sitter  His gait is unsteady and he a high fall risk A   Pt on 1:1 R   Safe at present

## 2013-02-18 NOTE — Progress Notes (Signed)
1:1  Note -- D. Pt has rested well tonight, no complaints voiced, upon awakening complaint of chronic pain but in pleasant mood.  Requested medication for muscle spasms and anxiety.  A.  1:1 continued as ordered for Pt safety  R.  Pt remains safe, will continue to monitor.

## 2013-02-18 NOTE — Progress Notes (Signed)
1:1 note ---D.  Pt resting in bed with eyes closed, respirations even and unlabored.  No distress noted.  A.  1:1 continued as ordered for Pt safety.  R.  Pt remains safe, will continue to monitor.

## 2013-02-18 NOTE — Progress Notes (Signed)
D   Pt is a high fall risk and has no use of his right leg   He needs assisstance with adls and ambulation A   Pt on 1:1 R   Safe at present

## 2013-02-18 NOTE — Progress Notes (Signed)
Patient did not attend the evening speaker AA meeting. Pt returned to room at the start of meeting and remained in bed.

## 2013-02-18 NOTE — [facility] Group Notes (Signed)
[facility] Group Notes: (Clinical Social Work)  02/18/2013  3:00-4:00PM  Summary of Progress/Problems: The main focus of today's process group was to identify the patient's current support system and decide on other supports that can be put in place. Four definitions/levels of support were discussed. An emphasis was placed on using counselor, doctor, therapy groups, 12-step groups, and problem-specific support groups to expand supports. The group also discussed the stigma that goes along with mental illness and how to be self-protective while asking for additional supports from friends/church members/family.   Type of Therapy: Process Group  Participation Level: Did not attend   Rod Manjinder Breau LCSW  02/18/2013 2:29 PM

## 2013-02-18 NOTE — Progress Notes (Signed)
Psychoeducational Group Note  Date:  02/18/2013 Time:  0945 am  Group Topic/Focus:  Making Healthy Choices:   The focus of this group is to help patients identify negative/unhealthy choices they were using prior to admission and identify positive/healthier coping strategies to replace them upon discharge.  Participation Level:  Active  Participation Quality:  Appropriate, Attentive, Sharing and Supportive  Affect:  Appropriate and Blunted  Cognitive:  Alert and Appropriate  Insight:  Engaged  Engagement in Group:  Engaged  Additional Comments:    Andrena Mews 02/18/2013, 10:29 AM

## 2013-02-18 NOTE — Progress Notes (Signed)
1:1  Note --  D.  Pt pleasant and bright on approach, continued complaint of chronic pain but much brighter affect.  Positive for evening AA group with appropriate participation.  Interacting appropriately within milieu.  Denies SI/HI/hallucinations at this time.  A.  Pt clothing changed by MHT on 1:1 with Pt, 1:1 continued as ordered for Pt safety.  R.  Pt remains safe on unit, in dayroom watching TV with peers.

## 2013-02-18 NOTE — Progress Notes (Signed)
D   Pt has been up in his wheelchair   He has attended groups and participates appropriately   Continues to complain of chronic pain and frequently asks for pain medications   He continues to be a high fall risk due to lack of use of his right leg and generalized all over weakness A   Verbal support given  Medications administered and effectiveness monitored   Q 15 min checks   Pt on 1:1 R   Pt safe at present

## 2013-02-18 NOTE — Progress Notes (Signed)
[facility] MD Progress Note  02/18/2013 10:53 AM Jeffrey [name]  MRN:  [id number] Subjective:  Jeffrey [name] is lying in bed this morning and states that he is doing "not good." He reports that his "back is out again." He states that the social worker told him that he qualifies to 4 assistance through [place], and they will help him with medical care and housing. He is hopeful for that fact. He endorses having suicidal thoughts that come and go, with plans to hang himself or drink Listerine. He denies any homicidal ideation. He denies any auditory or visual hallucinations. He reports that his depression is a 7 on a scale of 1-10 where 10 is the worst, and attributes that to his increased pain level. He also endorses anxiety of an 8 on a scale of 1-10 because his future is uncertain. He endorses sleeping well last night, and states that his appetite is good.  Diagnosis:   Axis I: Alcohol Dependence, Bipolar Disorder Axis II: Deferred Axis III:  Past Medical History  Diagnosis Date  . DDD (degenerative disc disease)   . Necrotizing fasciitis   . Degenerative disc disease   . Hypertension   . Degenerative disc disease   . Hepatitis C   . Chronic pain   . DDD (degenerative disc disease)   . Parkinson disease   . Mental disorder   . Depression   . DDD (degenerative disc disease)   . DDD (degenerative disc disease), lumbar     ADL's:  Impaired  Sleep: Good  Appetite:  Good  Suicidal Ideation:  Plan:  To hang himself Means:  No Homicidal Ideation:  Patient denies any thought, plan, or intent AEB (as evidenced by):  Psychiatric Specialty Exam: Review of Systems  Constitutional: Negative.   HENT: Negative.   Eyes: Negative.   Respiratory: Negative.   Cardiovascular: Negative.   Gastrointestinal: Negative.   Genitourinary: Negative.   Musculoskeletal: Positive for back pain.  Skin: Negative.   Endo/Heme/Allergies: Negative.   Psychiatric/Behavioral: Positive for depression and suicidal  ideas. The patient is nervous/anxious. The patient does not have insomnia.     Blood pressure 157/88, pulse 60, temperature 98.6 F (37 C), temperature source Oral, resp. rate 18, height 6\' 4"  (1.93 m), weight 99.791 kg (220 lb), SpO2 98.00%.Body mass index is 26.79 kg/(m^2).  General Appearance: Disheveled  Eye Solicitor::  Fair  Speech:  Clear and Coherent  Volume:  Normal  Mood:  Anxious, Depressed and Irritable  Affect:  Congruent  Thought Process:  Circumstantial  Orientation:  Full (Time, Place, and Person)  Thought Content:  WDL  Suicidal Thoughts:  Yes.  with intent/plan  Homicidal Thoughts:  No  Memory:  Immediate;   Good Recent;   Good Remote;   Good  Judgement:  Fair  Insight:  Lacking  Psychomotor Activity:  Decreased  Concentration:  Good  Recall:  Good  Akathisia:  No  Handed:  Right  AIMS (if indicated):     Assets:  Communication Skills Desire for Improvement  Sleep:  Number of Hours: 6.5   Current Medications: Current Facility-Administered Medications  Medication Dose Route Frequency Provider Last Rate Last Dose  . acetaminophen (TYLENOL) tablet 650 mg  650 mg Oral Q6H PRN Earney Navy, NP   650 mg at 02/17/13 1403  . alum & mag hydroxide-simeth (MAALOX/MYLANTA) 200-200-20 MG/5ML suspension 30 mL  30 mL Oral Q4H PRN Earney Navy, NP      . chlordiazePOXIDE (LIBRIUM) capsule 25 mg  25 mg  Oral Q6H PRN Kerry Hough, PA-C   25 mg at 02/18/13 0606  . divalproex (DEPAKOTE) DR tablet 500 mg  500 mg Oral Q12H Rachael Fee, MD   500 mg at 02/18/13 0802  . DULoxetine (CYMBALTA) DR capsule 60 mg  60 mg Oral Daily Rachael Fee, MD   60 mg at 02/18/13 0803  . folic acid (FOLVITE) tablet 1 mg  1 mg Oral Daily Earney Navy, NP   1 mg at 02/18/13 0803  . gabapentin (NEURONTIN) capsule 900 mg  900 mg Oral TID Pamella Pert, MD   900 mg at 02/18/13 0803  . magnesium hydroxide (MILK OF MAGNESIA) suspension 30 mL  30 mL Oral Daily PRN Earney Navy, NP       . metoprolol succinate (TOPROL-XL) 24 hr tablet 50 mg  50 mg Oral Daily Pamella Pert, MD   50 mg at 02/18/13 0802  . multivitamin with minerals tablet 1 tablet  1 tablet Oral Daily Earney Navy, NP   1 tablet at 02/18/13 0802  . nicotine polacrilex (NICORETTE) gum 2 mg  2 mg Oral PRN Rachael Fee, MD   2 mg at 02/17/13 1534  . oxyCODONE (Oxy IR/ROXICODONE) immediate release tablet 10 mg  10 mg Oral TID Rachael Fee, MD   10 mg at 02/18/13 0606  . potassium chloride SA (K-DUR,KLOR-CON) CR tablet 20 mEq  20 mEq Oral BID Fransisca Kaufmann, NP   20 mEq at 02/18/13 0802  . thiamine (VITAMIN B-1) tablet 100 mg  100 mg Oral Daily Earney Navy, NP   100 mg at 02/18/13 0802  . tiZANidine (ZANAFLEX) tablet 4 mg  4 mg Oral Q8H PRN Pamella Pert, MD   4 mg at 02/18/13 0606  . traMADol (ULTRAM) tablet 50 mg  50 mg Oral BID PRN Belkys A Regalado, MD   50 mg at 02/18/13 0802  . traZODone (DESYREL) tablet 50 mg  50 mg Oral QHS PRN,MR X 1 Earney Navy, NP   50 mg at 02/17/13 2126    Lab Results: No results found for this or any previous visit (from the past 48 hour(s)).  Physical Findings: AIMS: Facial and Oral Movements Muscles of Facial Expression: None, normal Lips and Perioral Area: None, normal Jaw: None, normal Tongue: None, normal,Extremity Movements Upper (arms, wrists, hands, fingers): None, normal Lower (legs, knees, ankles, toes): None, normal, Trunk Movements Neck, shoulders, hips: None, normal, Overall Severity Severity of abnormal movements (highest score from questions above): None, normal Incapacitation due to abnormal movements: None, normal Patient's awareness of abnormal movements (rate only patient's report): No Awareness, Dental Status Current problems with teeth and/or dentures?: No Does patient usually wear dentures?: No  CIWA:  CIWA-Ar Total: 0 COWS:  COWS Total Score: 5  Treatment Plan Summary: Daily contact with patient to assess and evaluate symptoms and  progress in treatment Medication management  Plan:  Medical Decision Making Problem Points:  Established problem, stable/improving (1) and Review of psycho-social stressors (1) Data Points:  Review or order clinical lab tests (1) Review of medication regiment & side effects (2)  I certify that inpatient services furnished can reasonably be expected to improve the patient's condition.   WATT,ALAN 02/18/2013, 10:53 AM  Reviewed note, continue current medications.  Jacqulyn Cane, M.D.  02/18/2013 7:48 PM

## 2013-02-18 NOTE — Progress Notes (Signed)
Adult Psychoeducational Group Note  Date:  02/17/2013  Time:15:15pm  Group Topic/Focus:  Healthy Communication:   The focus of this group is to discuss communication, barriers to communication, as well as healthy ways to communicate with others.  Participation Level:  Active  Participation Quality:  Appropriate, Sharing and Supportive  Affect:  Appropriate  Cognitive:  Appropriate  Insight: Appropriate  Engagement in Group:  Engaged and Supportive  Modes of Intervention:  Discussion, Education and Support  Additional Comments:  Pt was very involved,  participated and was very engaged with the group.  Isla Pence M 02/18/2013, 11:46 AM

## 2013-02-18 NOTE — Progress Notes (Signed)
[facility] Group Notes:  (Nursing/MHT/Case Management/Adjunct)  Date:  02/17/2013  Time: 2100  Type of Therapy:  wrap up group  Participation Level:  Active  Participation Quality:  Appropriate, Attentive, Sharing and Supportive  Affect:  Depressed  Cognitive:  Appropriate  Insight:  Improving  Engagement in Group:  Engaged  Modes of Intervention:  Clarification, Education and Support  Summary of Progress/Problems: Pt reports living in 3 different houses in 4 months. Pt is optimistic that [facility] will be able to help him find reliable housing and care. Pt states that this is the first day in a while he has not felt suicidal.   Shelah Lewandowsky 02/18/2013, 12:11 AM

## 2013-02-19 NOTE — [facility] Group Notes (Signed)
[facility] LCSW Aftercare Discharge Planning Group Note   02/19/2013 9:40 AM  Participation Quality:  Appropriate  Mood/Affect:  Anxious and Depressed  Depression Rating:  10  Anxiety Rating:  10  Thoughts of Suicide:  No Will you contract for safety?   NA  Current AVH:  No  Plan for Discharge/Comments:  Pt working with [employer] transitional care. [facility] for med management and needs appt with [facility] for medical meds.   Transportation Means: [employer]  Supports: [facility]/no family supports  Counselling psychologist, Research scientist (physical sciences)

## 2013-02-19 NOTE — Progress Notes (Signed)
1:1  Note  D.  Pt rested well on and off this night, requested pain medication as soon as he could get it this morning, states that his pain issues for when he is discharged are not yet addressed.  A.  Medication given as requested, MHT assisted Pt to dress and clean himself.  1:1 continued as ordered for Pt safety.  R.  Pt remains safe, will continue to monitor.

## 2013-02-19 NOTE — Progress Notes (Signed)
Pt with small (pencil eraser sized) wound on L upper thigh. Slight oozing noted. Telfa applied with paper tape. Jeffrey [name]

## 2013-02-19 NOTE — Progress Notes (Signed)
*  see previous note from RN at 0000 in MHT 1:1 flowsheet.  D.  Pt resting in bed with eyes closed, respirations even and unlabored.  No distress noted.  A.  1:1 continued as ordered for Pt safety  R. Pt remains safe, will continue to monitor.

## 2013-02-19 NOTE — Progress Notes (Signed)
See shadow chart for progress notes pt on 1:1 for safety

## 2013-02-19 NOTE — Progress Notes (Signed)
Adult Psychoeducational Group Note  Date:  02/19/2013 Time:  11:00AM  Group Topic/Focus:  Self Care:   The focus of this group is to help patients understand the importance of self-care in order to improve or restore emotional, physical, spiritual, interpersonal, and financial health.  Participation Level:  Active  Participation Quality:  Appropriate  Affect:  Appropriate and Irritable  Cognitive:  Appropriate  Insight: Appropriate  Engagement in Group:  Engaged  Modes of Intervention:  Discussion  Additional Comments:  Pt participated in group discussion and was active throughout group   Escher Harr K 02/19/2013, 1:04 PM

## 2013-02-19 NOTE — Progress Notes (Signed)
Pt reports he is doing better.  He continues on 1:1 for safety d/t high fall risk.  He continues to receive his pain medications for his chronic back pain.  He has been going to groups and participated tonight with the group activity.  He has been appropriate with peers and staff.  He voices not needs or concerns, other than asking for his nicotine gum as needed.  He is unsure when he will be discharged and he is not sure of his discharge plan.  Support and encouragement offered.  Safety maintained with q15 minute checks.

## 2013-02-19 NOTE — [facility] Group Notes (Signed)
[facility] LCSW Group Therapy  02/19/2013 2:01 PM  Type of Therapy:  Group Therapy  Participation Level:  None  Participation Quality:  Drowsy  Affect:  Lethargic  Cognitive:  Lacking  Insight:  Lacking  Engagement in Therapy:  Lacking  Modes of Intervention:  Discussion, Education, Exploration, Socialization and Support  Summary of Progress/Problems: Today's Topic: Overcoming Obstacles. Bernardo came to group late and left several times due to issues with pain. He did not participate and did not respond to encouragement for participation. Pt stated that pain was an issue for him today.    Smart, Verina Galeno 02/19/2013, 2:01 PM

## 2013-02-19 NOTE — Progress Notes (Signed)
Adult Psychoeducational Group Note  Date:  02/19/2013 Time:  10:35 PM  Group Topic/Focus:  Wrap-Up Group:   The focus of this group is to help patients review their daily goal of treatment and discuss progress on daily workbooks.  Participation Level:  Active  Participation Quality:  Appropriate and Drowsy  Affect:  Flat  Cognitive:  Alert and Oriented  Insight: Improving  Engagement in Group:  Engaged  Modes of Intervention:  Exploration and Support  Additional Comments:  Pt stated that he was feeling better, that we have "compassionate people here". Pt also participated in the nightly activity by adding a note of thanks to the hall gratitude tree.   [name] [name] 02/19/2013, 10:35 PM

## 2013-02-19 NOTE — Progress Notes (Signed)
Patient ID: Jeffrey [name], male   DOB: [birth year], 57 y.o.   MRN: [id number] [facility] MD Progress Note  02/19/2013 1:01 PM Cornel Werber  MRN:  [id number]  Subjective:  Nickolis is seen and assessed sitting up in a wheel chair. He continues to endorse suicidal ideations off and on. As to plans to hurt himself, he states "it varies". States that his mood is still bad and depressed. Rated his depression at #7 today. Adds that he is still hurting. Waiting to hear from [facility] for the programs that he is qualified for he reported. Her remains on 1:1 supervision due to fall risks.   Diagnosis:   Axis I: Alcohol Dependence, Bipolar Disorder Axis II: Deferred Axis III:  Past Medical History  Diagnosis Date  . DDD (degenerative disc disease)   . Necrotizing fasciitis   . Degenerative disc disease   . Hypertension   . Degenerative disc disease   . Hepatitis C   . Chronic pain   . DDD (degenerative disc disease)   . Parkinson disease   . Mental disorder   . Depression   . DDD (degenerative disc disease)   . DDD (degenerative disc disease), lumbar     ADL's:  Impaired  Sleep: Good  Appetite:  Good  Suicidal Ideation:  Plan: Varies Means:  No Homicidal Ideation:  Patient denies any thought, plan, or intent  AEB (as evidenced by): Per patient reports.  Psychiatric Specialty Exam: Review of Systems  Constitutional: Negative.   HENT: Negative.   Eyes: Negative.   Respiratory: Negative.   Cardiovascular: Negative.   Gastrointestinal: Negative.   Genitourinary: Negative.   Musculoskeletal: Positive for back pain.  Skin: Negative.   Endo/Heme/Allergies: Negative.   Psychiatric/Behavioral: Positive for depression and suicidal ideas. The patient is nervous/anxious. The patient does not have insomnia.     Blood pressure 122/81, pulse 57, temperature 97.6 F (36.4 C), temperature source Oral, resp. rate 18, height 6\' 4"  (1.93 m), weight 99.791 kg (220 lb), SpO2 98.00%.Body mass index is  26.79 kg/(m^2).  General Appearance: Disheveled  Eye Solicitor::  Fair  Speech:  Clear and Coherent  Volume:  Normal  Mood:  Anxious, Depressed and Irritable  Affect:  Congruent  Thought Process:  Circumstantial  Orientation:  Full (Time, Place, and Person)  Thought Content:  Ruminations  Suicidal Thoughts:  Yes, plan varies.  Homicidal Thoughts:  No  Memory:  Immediate;   Good Recent;   Good Remote;   Good  Judgement:  Fair  Insight:  Lacking  Psychomotor Activity:  Decreased  Concentration:  Good  Recall:  Good  Akathisia:  No  Handed:  Right  AIMS (if indicated):     Assets:  Communication Skills Desire for Improvement  Sleep:  Number of Hours: 4.25   Current Medications: Current Facility-Administered Medications  Medication Dose Route Frequency Provider Last Rate Last Dose  . acetaminophen (TYLENOL) tablet 650 mg  650 mg Oral Q6H PRN Earney Navy, NP   650 mg at 02/19/13 0818  . alum & mag hydroxide-simeth (MAALOX/MYLANTA) 200-200-20 MG/5ML suspension 30 mL  30 mL Oral Q4H PRN Earney Navy, NP      . chlordiazePOXIDE (LIBRIUM) capsule 25 mg  25 mg Oral Q6H PRN Kerry Hough, PA-C   25 mg at 02/19/13 0548  . divalproex (DEPAKOTE) DR tablet 500 mg  500 mg Oral Q12H Rachael Fee, MD   500 mg at 02/19/13 0815  . DULoxetine (CYMBALTA) DR capsule 60 mg  60  mg Oral Daily Rachael Fee, MD   60 mg at 02/19/13 1478  . folic acid (FOLVITE) tablet 1 mg  1 mg Oral Daily Earney Navy, NP   1 mg at 02/19/13 0815  . gabapentin (NEURONTIN) capsule 900 mg  900 mg Oral TID Pamella Pert, MD   900 mg at 02/19/13 1207  . magnesium hydroxide (MILK OF MAGNESIA) suspension 30 mL  30 mL Oral Daily PRN Earney Navy, NP      . metoprolol succinate (TOPROL-XL) 24 hr tablet 50 mg  50 mg Oral Daily Pamella Pert, MD   50 mg at 02/18/13 0802  . multivitamin with minerals tablet 1 tablet  1 tablet Oral Daily Earney Navy, NP   1 tablet at 02/19/13 0815  . nicotine  polacrilex (NICORETTE) gum 2 mg  2 mg Oral PRN Rachael Fee, MD   2 mg at 02/19/13 0819  . oxyCODONE (Oxy IR/ROXICODONE) immediate release tablet 10 mg  10 mg Oral TID Rachael Fee, MD   10 mg at 02/19/13 0600  . potassium chloride SA (K-DUR,KLOR-CON) CR tablet 20 mEq  20 mEq Oral BID Fransisca Kaufmann, NP   20 mEq at 02/19/13 0814  . thiamine (VITAMIN B-1) tablet 100 mg  100 mg Oral Daily Earney Navy, NP   100 mg at 02/19/13 0815  . tiZANidine (ZANAFLEX) tablet 4 mg  4 mg Oral Q8H PRN Pamella Pert, MD   4 mg at 02/19/13 0549  . traMADol (ULTRAM) tablet 50 mg  50 mg Oral BID PRN Belkys A Regalado, MD   50 mg at 02/19/13 0548  . traZODone (DESYREL) tablet 50 mg  50 mg Oral QHS PRN,MR X 1 Earney Navy, NP   50 mg at 02/18/13 2134    Lab Results: No results found for this or any previous visit (from the past 48 hour(s)).  Physical Findings: AIMS: Facial and Oral Movements Muscles of Facial Expression: None, normal Lips and Perioral Area: None, normal Jaw: None, normal Tongue: None, normal,Extremity Movements Upper (arms, wrists, hands, fingers): None, normal Lower (legs, knees, ankles, toes): None, normal, Trunk Movements Neck, shoulders, hips: None, normal, Overall Severity Severity of abnormal movements (highest score from questions above): None, normal Incapacitation due to abnormal movements: None, normal Patient's awareness of abnormal movements (rate only patient's report): No Awareness, Dental Status Current problems with teeth and/or dentures?: No Does patient usually wear dentures?: No  CIWA:  CIWA-Ar Total: 0 COWS:  COWS Total Score: 5  Treatment Plan Summary: Daily contact with patient to assess and evaluate symptoms and progress in treatment Medication management  Plan: Supportive approach/coping skills/relapse prevention. Encouraged out of room, participation in group sessions and application of coping skills when distressed. Will continue to monitor response  to/adverse effects of medications in use to assure effectiveness. Continue to monitor mood, behavior and interaction with staff and other patients. Discharge plans in progress. Continue current plan of care.  Medical Decision Making Problem Points:  Established problem, stable/improving (1) and Review of psycho-social stressors (1) Data Points:  Review or order clinical lab tests (1) Review of medication regiment & side effects (2)  I certify that inpatient services furnished can reasonably be expected to improve the patient's condition.   Armandina Stammer I, PMHNP-BC 02/19/2013, 1:01 PM

## 2013-02-20 NOTE — Progress Notes (Signed)
See note in shadow chart.  Pt denies SI/HI/AV at this time.  He states he may be discharge Wed or Thursday to another facility.  He says he feels agreeable to this plan.  Support and encouragement offered.  Pt continues safe with 1:1 observation.

## 2013-02-20 NOTE — [facility] Group Notes (Signed)
[facility] LCSW Aftercare Discharge Planning Group Note   02/20/2013 9:52 AM  Participation Quality:  Appropriate   Mood/Affect:  Anxious  Depression Rating:  "high"--due to pain  Anxiety Rating:  "High"--due to pain  Thoughts of Suicide:  No Will you contract for safety?   NA  Current AVH:  No  Plan for Discharge/Comments:  Patient will work with [facility] at [facility] and will discharge to shelter. Currently exploring options with pt--pt plans to file for disability in order to qualify for admission to SNF. Meeting today with pt and Scientist, product/process development.   Transportation Means: [facility]  Supports: [facility]/no family  Smart, Research scientist (physical sciences)

## 2013-02-20 NOTE — [facility] Group Notes (Signed)
[facility] LCSW Group Therapy  02/20/2013 1:25 PM  Type of Therapy:  Group Therapy  Participation Level:  Active  Participation Quality:  Appropriate  Affect:  Appropriate  Cognitive:  Appropriate  Insight:  Engaged  Engagement in Therapy:  Improving  Modes of Intervention:  Discussion, Education, Exploration and Support  Summary of Progress/Problems: [facility] Speaker came to talk about his personal journey with substance abuse and addiction. The pt processed ways by which to relate to the speaker. MHA speaker provided handouts and educational information pertaining to groups and services offered by the [facility]. Jeffrey [name] processed the information shared by the [facility] speaker. He sat quietly and respectfully throughout the presentation and thanked the speaker after group ended.    Smart, Jeffrey [name] 02/20/2013, 1:25 PM

## 2013-02-20 NOTE — Progress Notes (Signed)
See shadow chart for 1:1 progress notes

## 2013-02-20 NOTE — Progress Notes (Signed)
Patient ID: Fabrice Dyal, male   DOB: Nov 25, 1955, 57 y.o.   MRN: [id number] Patient ID: Jhonnie Aliano, male   DOB: Apr 05, 1956, 57 y.o.   MRN: [id number] [facility] MD Progress Note  02/20/2013 3:28 PM Fitzroy Mikami  MRN:  [id number]  Subjective:  [name] remains on 1:1 supervision for safety, assist with adls including suicidal thoughts. He continue to endorse suicidal thoughts and states it is all related to the massive pain episodes he is experiencing. He adds that his pain is associated with his degenerative conditions that has been ravaging his body. On a lighter note, Rice did report that he is feeling a lot relieved now that there is a place for him to go after discharge. He denies AVH.  Diagnosis:   Axis I: Alcohol Dependence, Bipolar Disorder Axis II: Deferred Axis III:  Past Medical History  Diagnosis Date  . DDD (degenerative disc disease)   . Necrotizing fasciitis   . Degenerative disc disease   . Hypertension   . Degenerative disc disease   . Hepatitis C   . Chronic pain   . DDD (degenerative disc disease)   . Parkinson disease   . Mental disorder   . Depression   . DDD (degenerative disc disease)   . DDD (degenerative disc disease), lumbar     ADL's:  Impaired  Sleep: Good  Appetite:  Good  Suicidal Ideation:  Plan: Varies Means:  No Homicidal Ideation:  Patient denies any thought, plan, or intent  AEB (as evidenced by): Per patient reports.  Psychiatric Specialty Exam: Review of Systems  Constitutional: Negative.   HENT: Negative.   Eyes: Negative.   Respiratory: Negative.   Cardiovascular: Negative.   Gastrointestinal: Negative.   Genitourinary: Negative.   Musculoskeletal: Positive for back pain.  Skin: Negative.   Endo/Heme/Allergies: Negative.   Psychiatric/Behavioral: Positive for depression and suicidal ideas. The patient is nervous/anxious. The patient does not have insomnia.     Blood pressure 125/83, pulse 74, temperature 98.1 F (36.7 C),  temperature source Oral, resp. rate 16, height 6\' 4"  (1.93 m), weight 99.791 kg (220 lb), SpO2 98.00%.Body mass index is 26.79 kg/(m^2).  General Appearance: Disheveled  Eye Solicitor::  Fair  Speech:  Clear and Coherent  Volume:  Normal  Mood:  Anxious, Depressed and Irritable  Affect:  Congruent  Thought Process:  Circumstantial  Orientation:  Full (Time, Place, and Person)  Thought Content:  Ruminations  Suicidal Thoughts:  Yes, plan varies.  Homicidal Thoughts:  No  Memory:  Immediate;   Good Recent;   Good Remote;   Good  Judgement:  Fair  Insight:  Lacking  Psychomotor Activity:  Decreased  Concentration:  Good  Recall:  Good  Akathisia:  No  Handed:  Right  AIMS (if indicated):     Assets:  Communication Skills Desire for Improvement  Sleep:  Number of Hours: 5.75   Current Medications: Current Facility-Administered Medications  Medication Dose Route Frequency Provider Last Rate Last Dose  . acetaminophen (TYLENOL) tablet 650 mg  650 mg Oral Q6H PRN Earney Navy, NP   650 mg at 02/19/13 1901  . alum & mag hydroxide-simeth (MAALOX/MYLANTA) 200-200-20 MG/5ML suspension 30 mL  30 mL Oral Q4H PRN Earney Navy, NP      . chlordiazePOXIDE (LIBRIUM) capsule 25 mg  25 mg Oral Q6H PRN Kerry Hough, PA-C   25 mg at 02/19/13 0548  . divalproex (DEPAKOTE) DR tablet 500 mg  500 mg Oral Q12H Rachael Fee,  MD   500 mg at 02/20/13 0752  . DULoxetine (CYMBALTA) DR capsule 60 mg  60 mg Oral Daily Rachael Fee, MD   60 mg at 02/20/13 0753  . folic acid (FOLVITE) tablet 1 mg  1 mg Oral Daily Earney Navy, NP   1 mg at 02/20/13 0752  . gabapentin (NEURONTIN) capsule 900 mg  900 mg Oral TID Pamella Pert, MD   900 mg at 02/20/13 1202  . magnesium hydroxide (MILK OF MAGNESIA) suspension 30 mL  30 mL Oral Daily PRN Earney Navy, NP      . metoprolol succinate (TOPROL-XL) 24 hr tablet 50 mg  50 mg Oral Daily Pamella Pert, MD   50 mg at 02/20/13 0752  . multivitamin  with minerals tablet 1 tablet  1 tablet Oral Daily Earney Navy, NP   1 tablet at 02/20/13 0752  . nicotine polacrilex (NICORETTE) gum 2 mg  2 mg Oral PRN Rachael Fee, MD   2 mg at 02/20/13 1411  . oxyCODONE (Oxy IR/ROXICODONE) immediate release tablet 10 mg  10 mg Oral TID Rachael Fee, MD   10 mg at 02/20/13 1409  . potassium chloride SA (K-DUR,KLOR-CON) CR tablet 20 mEq  20 mEq Oral BID Fransisca Kaufmann, NP   20 mEq at 02/20/13 0753  . thiamine (VITAMIN B-1) tablet 100 mg  100 mg Oral Daily Earney Navy, NP   100 mg at 02/20/13 0752  . tiZANidine (ZANAFLEX) tablet 4 mg  4 mg Oral Q8H PRN Pamella Pert, MD   4 mg at 02/20/13 0755  . traMADol (ULTRAM) tablet 50 mg  50 mg Oral BID PRN Belkys A Regalado, MD   50 mg at 02/19/13 0548  . traZODone (DESYREL) tablet 50 mg  50 mg Oral QHS PRN,MR X 1 Earney Navy, NP   50 mg at 02/19/13 2305    Lab Results: No results found for this or any previous visit (from the past 48 hour(s)).  Physical Findings: AIMS: Facial and Oral Movements Muscles of Facial Expression: None, normal Lips and Perioral Area: None, normal Jaw: None, normal Tongue: None, normal,Extremity Movements Upper (arms, wrists, hands, fingers): None, normal Lower (legs, knees, ankles, toes): None, normal, Trunk Movements Neck, shoulders, hips: None, normal, Overall Severity Severity of abnormal movements (highest score from questions above): None, normal Incapacitation due to abnormal movements: None, normal Patient's awareness of abnormal movements (rate only patient's report): No Awareness, Dental Status Current problems with teeth and/or dentures?: No Does patient usually wear dentures?: No  CIWA:  CIWA-Ar Total: 0 COWS:  COWS Total Score: 5  Treatment Plan Summary: Daily contact with patient to assess and evaluate symptoms and progress in treatment Medication management  Plan: Supportive approach/coping skills/relapse prevention. Encouraged out of room,  participation in group sessions and application of coping skills when distressed. Will continue to monitor response to/adverse effects of medications in use to assure effectiveness. Continue to monitor mood, behavior and interaction with staff and other patients. Discharge plans in progress, possible discharge in am. Continue current plan of care.  Medical Decision Making Problem Points:  Established problem, stable/improving (1) and Review of psycho-social stressors (1) Data Points:  Review or order clinical lab tests (1) Review of medication regiment & side effects (2)  I certify that inpatient services furnished can reasonably be expected to improve the patient's condition.   Armandina Stammer I, PMHNP-BC 02/20/2013, 3:28 PM

## 2013-02-20 NOTE — Progress Notes (Signed)
Recreation Therapy Notes  Date: 06.17.2014 Time: 2:30pm Location: [address] Dayroom      Group Topic/Focus: Musician (AAA/T)  Participation Level: Active  Participation Quality: Appropriate  Affect: Euthymic  Cognitive: Appropriate  Additional Comments: 06.17.2014 Session = AAA Session; Dog Team = [facility] & handler  Patient pet and visited with [place]. Patient shared facts with the group he knows about dogs. Patient asked appropriate questions about [facility] and his training. Patient interacted appropriately with dog team, peers and LRT.   Marykay Lex Lennis Rader, LRT/CTRS  Jearl Klinefelter 02/20/2013 4:23 PM

## 2013-02-20 NOTE — Progress Notes (Signed)
Adult Psychoeducational Group Note  Date:  02/20/2013 Time:  10:00pm  Group Topic/Focus:  Recovery Goals:   The focus of this group is to identify appropriate goals for recovery and establish a plan to achieve them.  Participation Level:  Active  Participation Quality:  Appropriate, Sharing and Supportive  Affect:  Appropriate  Cognitive:  Appropriate  Insight: Appropriate  Engagement in Group:  Engaged and Supportive  Modes of Intervention:  Discussion, Education and Support  Additional Comments:  Pt was appropriate and participated in group.   Isla Pence M 02/20/2013, 7:35 PM

## 2013-02-20 NOTE — Progress Notes (Signed)
Recreation Therapy Notes  Date: 06.17.2014 Time: 3:00pm Location: [address] Dayroom      Group Topic/Focus: Decision Making  Participation Level: Minimal  Participation Quality: Appropriate  Affect: Euthymic  Cognitive: Appropriate   Additional Comments: Activity: List Boat Game ; Explanation: Patients given a scenario about taking a trip on a yacht, 1/2 way through their trip the yacht springs a leak and the patients must return to shore. They can save the group as well as 8 people off the following list: President Obama, Devona Konig, Physician, Nurse, Mechanic, Electrician, [place], Teacher, Pregnant Woman, Ex-Convict, Ex-Marine, [place], [place], [place], [place]. Patients needed to work together to agree on the list of people they would like to save.   Patient arrived to group during last 5 minutes. Patient listened to wrap up discussion, but did not contribute.  Jeffrey [name], LRT/CTRS  Tzippy Testerman L 02/20/2013 4:06 PM

## 2013-02-21 NOTE — Progress Notes (Signed)
See 1:1 notes in shadow chart. 

## 2013-02-21 NOTE — Progress Notes (Signed)
See shadow chart for 1:1 progress note.  Pt feeling depressed since he was turned down for the Nursing facility that he thought he was going to today.  Pt [place].  Discussed with pt the impossibility of this working for him d/t his physical condition.  Pt realizes that he cannot survive without assistance.  He is hoping he can go to a pain clinic to help control his pain level.  He said if he can get help with the pain, that would help him not drink.  "I drink to help with the pain."  He contracts for safety.  Pt denies HI/AV.  Support and encouragement offered.  Continue monitored 1:1 for safety.

## 2013-02-21 NOTE — Tx Team (Signed)
Interdisciplinary Treatment Plan Update (Adult)  Date: 02/21/2013  Time Reviewed: 9:55 AM  Progress in Treatment:  Attending groups: Intermittently Participating in groups:Minimally Taking medication as prescribed: Yes  Tolerating medication: Yes  Family/Significant othe contact made: No, pt refused to consent to family contact.  Patient understands diagnosis: Yes, AEB seeking help with substance abuse and depression  Discussing patient identified problems/goals with staff: Intermittently  Medical problems stabilized or resolved: Yes  Denies suicidal/homicidal ideation: Yes  Patient has not harmed self or Others: Yes  New problem(s) identified: Pain management is an issue for this patient.  Discharge Plan or Barriers: [facility] up through [facility] for [facility] care. Pt turned down at ALF due to age. Currently exploring other options.  Additional comments: N/A  Reason for Continuation of Hospitalization:  Depression  Medication managment  Estimated length of stay:1-2 days For review of initial/current patient goals, please see plan of care.  Attendees:    Patient:     Family:     Physician:Aggie PA  02/21/2013 9:57 AM    Nursing: Lupita Leash RN 02/21/2013 9:57 AM    Clinical Social Worker Marjarie Irion Smart, LCSWA  02/21/2013 9:57 AM    Other: Maureen Ralphs RN 02/21/2013 9:57 AM    Other: Anahita K. Psych Intern 02/21/2013 9:58 AM    Other:     Other:     Scribe for Treatment Team:  Trula Slade LCSWA, 02/21/2013 9:58 AM

## 2013-02-21 NOTE — Progress Notes (Signed)
Adult Psychoeducational Group Note  Date:  02/21/2013 Time:  8:00PM Group Topic/Focus:  Wrap-Up Group:   The focus of this group is to help patients review their daily goal of treatment and discuss progress on daily workbooks.  Participation Level:  Active  Participation Quality:  Appropriate  Affect:  Appropriate  Cognitive:  Alert and Appropriate  Insight: Appropriate  Engagement in Group:  Engaged  Modes of Intervention:  Discussion  Additional Comments:  Pt. Attended NA group.   Bing Plume D 02/21/2013, 8:24 PM

## 2013-02-21 NOTE — Progress Notes (Signed)
Patient ID: Jeffrey [name], male   DOB: 1955-09-17, 57 y.o.   MRN: [id number] Patient ID: Jeffrey [name], male   DOB: 1955/11/08, 57 y.o.   MRN: [id number] Patient ID: Jeffrey [name], male   DOB: [birth year], 57 y.o.   MRN: [id number] [facility] MD Progress Note  02/21/2013 2:06 PM Jeffrey [name]  MRN:  [id number]  Subjective:  Jeffrey [name] reports feeling very disappointed today because he was declined from going to his supposedly new place to live. He was declined because he is not 67 yet. Jeffrey [name] expressed how excited he felt yesterday when hearing the news that he has a place to go. He adds that he remains hopeful as the Child psychotherapist continue to make new contacts. He continues to endorse chronic pain episodes, depression and suicidal ideations. Is on 1:1 supervision. He remains incontinent bowel and bladder requiring maximum assist with adls. Uses wheel chair for ambulation.  Diagnosis:   Axis I: Alcohol Dependence, Bipolar Disorder Axis II: Deferred Axis III:  Past Medical History  Diagnosis Date  . DDD (degenerative disc disease)   . Necrotizing fasciitis   . Degenerative disc disease   . Hypertension   . Degenerative disc disease   . Hepatitis C   . Chronic pain   . DDD (degenerative disc disease)   . Parkinson disease   . Mental disorder   . Depression   . DDD (degenerative disc disease)   . DDD (degenerative disc disease), lumbar     ADL's:  Impaired  Sleep: Good  Appetite:  Good  Suicidal Ideation:  Plan: Varies Means:  No Homicidal Ideation:  Patient denies any thought, plan, or intent  AEB (as evidenced by): Per patient reports.  Psychiatric Specialty Exam: Review of Systems  Constitutional: Negative.   HENT: Negative.   Eyes: Negative.   Respiratory: Negative.   Cardiovascular: Negative.   Gastrointestinal: Negative.   Genitourinary: Negative.   Musculoskeletal: Positive for back pain.  Skin: Negative.   Endo/Heme/Allergies: Negative.   Psychiatric/Behavioral: Positive  for depression and suicidal ideas. The patient is nervous/anxious. The patient does not have insomnia.     Blood pressure 157/95, pulse 60, temperature 97.6 F (36.4 C), temperature source Oral, resp. rate 18, height 6\' 4"  (1.93 m), weight 99.791 kg (220 lb), SpO2 98.00%.Body mass index is 26.79 kg/(m^2).  General Appearance: Disheveled  Eye Solicitor::  Fair  Speech:  Clear and Coherent  Volume:  Normal  Mood:  Anxious, Depressed and Irritable  Affect:  Congruent  Thought Process:  Circumstantial  Orientation:  Full (Time, Place, and Person)  Thought Content:  Ruminations  Suicidal Thoughts:  Yes, plan varies.  Homicidal Thoughts:  No  Memory:  Immediate;   Good Recent;   Good Remote;   Good  Judgement:  Fair  Insight:  Lacking  Psychomotor Activity:  Decreased  Concentration:  Good  Recall:  Good  Akathisia:  No  Handed:  Right  AIMS (if indicated):     Assets:  Communication Skills Desire for Improvement  Sleep:  Number of Hours: 5.5   Current Medications: Current Facility-Administered Medications  Medication Dose Route Frequency Provider Last Rate Last Dose  . acetaminophen (TYLENOL) tablet 650 mg  650 mg Oral Q6H PRN Earney Navy, NP   650 mg at 02/19/13 1901  . alum & mag hydroxide-simeth (MAALOX/MYLANTA) 200-200-20 MG/5ML suspension 30 mL  30 mL Oral Q4H PRN Earney Navy, NP      . chlordiazePOXIDE (LIBRIUM) capsule 25 mg  25 mg Oral Q6H  PRN Kerry Hough, PA-C   25 mg at 02/21/13 1304  . divalproex (DEPAKOTE) DR tablet 500 mg  500 mg Oral Q12H Rachael Fee, MD   500 mg at 02/21/13 0802  . DULoxetine (CYMBALTA) DR capsule 60 mg  60 mg Oral Daily Rachael Fee, MD   60 mg at 02/21/13 0801  . folic acid (FOLVITE) tablet 1 mg  1 mg Oral Daily Earney Navy, NP   1 mg at 02/21/13 0802  . gabapentin (NEURONTIN) capsule 900 mg  900 mg Oral TID Pamella Pert, MD   900 mg at 02/21/13 1217  . magnesium hydroxide (MILK OF MAGNESIA) suspension 30 mL  30 mL Oral  Daily PRN Earney Navy, NP      . metoprolol succinate (TOPROL-XL) 24 hr tablet 50 mg  50 mg Oral Daily Pamella Pert, MD   50 mg at 02/21/13 0802  . multivitamin with minerals tablet 1 tablet  1 tablet Oral Daily Earney Navy, NP   1 tablet at 02/21/13 0802  . nicotine polacrilex (NICORETTE) gum 2 mg  2 mg Oral PRN Rachael Fee, MD   2 mg at 02/21/13 1218  . oxyCODONE (Oxy IR/ROXICODONE) immediate release tablet 10 mg  10 mg Oral TID Rachael Fee, MD   10 mg at 02/21/13 1333  . potassium chloride SA (K-DUR,KLOR-CON) CR tablet 20 mEq  20 mEq Oral BID Fransisca Kaufmann, NP   20 mEq at 02/21/13 0802  . thiamine (VITAMIN B-1) tablet 100 mg  100 mg Oral Daily Earney Navy, NP   100 mg at 02/21/13 0802  . tiZANidine (ZANAFLEX) tablet 4 mg  4 mg Oral Q8H PRN Pamella Pert, MD   4 mg at 02/21/13 0804  . traMADol (ULTRAM) tablet 50 mg  50 mg Oral BID PRN Belkys A Regalado, MD   50 mg at 02/21/13 1304  . traZODone (DESYREL) tablet 50 mg  50 mg Oral QHS PRN,MR X 1 Earney Navy, NP   50 mg at 02/20/13 2148    Lab Results: No results found for this or any previous visit (from the past 48 hour(s)).  Physical Findings: AIMS: Facial and Oral Movements Muscles of Facial Expression: None, normal Lips and Perioral Area: None, normal Jaw: None, normal Tongue: None, normal,Extremity Movements Upper (arms, wrists, hands, fingers): None, normal Lower (legs, knees, ankles, toes): None, normal, Trunk Movements Neck, shoulders, hips: None, normal, Overall Severity Severity of abnormal movements (highest score from questions above): None, normal Incapacitation due to abnormal movements: None, normal Patient's awareness of abnormal movements (rate only patient's report): No Awareness, Dental Status Current problems with teeth and/or dentures?: No Does patient usually wear dentures?: No  CIWA:  CIWA-Ar Total: 0 COWS:  COWS Total Score: 5  Treatment Plan Summary: Daily contact with patient  to assess and evaluate symptoms and progress in treatment Medication management  Plan: Supportive approach/coping skills/relapse prevention. Encouraged out of room, participation in group sessions and application of coping skills when distressed. Will continue to monitor response to/adverse effects of medications in use to assure effectiveness. Continue to monitor mood, behavior and interaction with staff and other patients. Continue discharge plans. Continue current plan of care.  Medical Decision Making Problem Points:  Established problem, stable/improving (1) and Review of psycho-social stressors (1) Data Points:  Review or order clinical lab tests (1) Review of medication regiment & side effects (2)  I certify that inpatient services furnished can reasonably be expected to improve  the patient's condition.   Armandina Stammer I, PMHNP-BC 02/21/2013, 2:06 PM

## 2013-02-21 NOTE — [facility] Group Notes (Signed)
[facility] LCSW Group Therapy  02/21/2013 2:01 PM  Type of Therapy:  Group Therapy  Participation Level:  Did Not Attend  Smart, Herbert Seta 02/21/2013, 2:01 PM

## 2013-02-21 NOTE — [facility] Group Notes (Signed)
[facility] LCSW Aftercare Discharge Planning Group Note   02/21/2013 9:12 AM  Participation Quality:  Appropriate  Mood/Affect:  Appropriate  Depression Rating:  5  Anxiety Rating:  5  Thoughts of Suicide:  No Will you contract for safety?   NA  Current AVH:  No  Plan for Discharge/Comments:  Pt just declined for [facility] due to being under 67. Currently exploring alternative options.   Transportation Means: unknown  Supports: no family/[facility] transitional [facility] team.   Smart, [employer]

## 2013-02-22 NOTE — [facility] Group Notes (Signed)
[facility] LCSW Group Therapy  02/22/2013 2:54 PM  Type of Therapy:  Group Therapy  Participation Level:  Active  Participation Quality:  Appropriate  Affect:  Depressed  Cognitive:  Appropriate  Insight:  Improving  Engagement in Therapy:  Improving  Modes of Intervention:  Discussion, Education, Exploration, Socialization and Support  Summary of Progress/Problems:  Finding Balance in Life. Today's group focused on defining balance in one's own words, identifying things that can knock one off balance, and exploring healthy ways to maintain balance in life. Group members were asked to provide an example of a time when they felt off balance, describe how they handled that situation,and process healthier ways to regain balance in the future. Group members were asked to share the most important tool for maintaining balance that they learned while at [facility] and how they plan to apply this method after discharge. Jeffrey [name] stated that he thinks balance is when one balances responsibilities, like personal healthcare, chores, cooking, and going to meetings. Jeffrey [name] processed ways to maintain or reestablish balance when thrown off balance. He stated that financial issues, pain and other health problems contribute to the sense of a loss of balance. He said that taking medications and managing pain, practicing patience, and taking care of his body help him maintain balance.    Jeffrey [name] 02/22/2013, 2:54 PM

## 2013-02-22 NOTE — BHH Group Notes (Signed)
[facility] LCSW Aftercare Discharge Planning Group Note   02/22/2013 9:33 AM  Participation Quality:  Approprate  Mood/Affect:  Depressed  Depression Rating:  5  Anxiety Rating:  5  Thoughts of Suicide:  No Will you contract for safety?   NA  Current AVH:  No  Plan for Discharge/Comments:  Pt gave friend's number to call [phone] to assist with getting him into [facility] in [place]. Pt made aware that we have exhausted all other resources and are working to find placement.   Transportation Means: unknown   Supports: Transport planner TCT/no family  Smart, Research scientist (physical sciences)

## 2013-02-22 NOTE — Progress Notes (Addendum)
Patient ID: Jeffrey [name], male   DOB: 05-Nov-1955, 57 y.o.   MRN: [id number] Patient ID: Jeffrey [name], male   DOB: [birth year], 57 y.o.   MRN: [id number] Patient ID: Jeffrey [name], male   DOB: Jan 16, 1956, 57 y.o.   MRN: [id number] Patient ID: Jeffrey [name], male   DOB: 1956-05-29, 56 y.o.   MRN: [id number] [facility] MD Progress Note  02/22/2013 1:37 PM Glen Kesinger  MRN:  [id number]  Subjective:  Jeffrey [name] reports "How in the world am suppose to feel when I know I got no place to go and live? I feel bad, awful depressed and disappointed. I'm hurting pretty bad. My mood is up and down. I think about ending it all if I have to spend what ever is left of my life this way. But I can't do it here. You guys won't let me. I feel stuck"  Diagnosis:   Axis I: Alcohol Dependence, Bipolar Disorder Axis II: Deferred Axis III:  Past Medical History  Diagnosis Date  . DDD (degenerative disc disease)   . Necrotizing fasciitis   . Degenerative disc disease   . Hypertension   . Degenerative disc disease   . Hepatitis C   . Chronic pain   . DDD (degenerative disc disease)   . Parkinson disease   . Mental disorder   . Depression   . DDD (degenerative disc disease)   . DDD (degenerative disc disease), lumbar     ADL's:  Impaired  Sleep: Good  Appetite:  Good  Suicidal Ideation:  Plan: Varies Means:  No Homicidal Ideation:  Patient denies any thought, plan, or intent  AEB (as evidenced by): Per patient reports.  Psychiatric Specialty Exam: Review of Systems  Constitutional: Negative.   HENT: Positive for neck pain.   Eyes: Negative.   Respiratory: Negative.   Cardiovascular: Negative.   Gastrointestinal: Negative.   Genitourinary: Negative.   Musculoskeletal: Positive for myalgias, back pain and joint pain.  Skin: Negative.   Endo/Heme/Allergies: Negative.   Psychiatric/Behavioral: Positive for depression, suicidal ideas and substance abuse. Negative for hallucinations. The patient is  nervous/anxious. The patient does not have insomnia.     Blood pressure 106/65, pulse 58, temperature 98.2 F (36.8 C), temperature source Oral, resp. rate 18, height 6\' 4"  (1.93 m), weight 99.791 kg (220 lb), SpO2 98.00%.Body mass index is 26.79 kg/(m^2).  General Appearance: Disheveled  Eye Solicitor::  Fair  Speech:  Clear and Coherent  Volume:  Normal  Mood:  Anxious, Depressed and Irritable  Affect:  Congruent  Thought Process:  Circumstantial  Orientation:  Full (Time, Place, and Person)  Thought Content:  Ruminations  Suicidal Thoughts:  Yes, plan varies.  Homicidal Thoughts:  No  Memory:  Immediate;   Good Recent;   Good Remote;   Good  Judgement:  Fair  Insight:  Lacking  Psychomotor Activity:  Decreased  Concentration:  Good  Recall:  Good  Akathisia:  No  Handed:  Right  AIMS (if indicated):     Assets:  Communication Skills Desire for Improvement  Sleep:  Number of Hours: 6   Current Medications: Current Facility-Administered Medications  Medication Dose Route Frequency Provider Last Rate Last Dose  . acetaminophen (TYLENOL) tablet 650 mg  650 mg Oral Q6H PRN Earney Navy, NP   650 mg at 02/19/13 1901  . alum & mag hydroxide-simeth (MAALOX/MYLANTA) 200-200-20 MG/5ML suspension 30 mL  30 mL Oral Q4H PRN Earney Navy, NP      . chlordiazePOXIDE (LIBRIUM) capsule  25 mg  25 mg Oral Q6H PRN Kerry Hough, PA-C   25 mg at 02/22/13 0735  . divalproex (DEPAKOTE) DR tablet 500 mg  500 mg Oral Q12H Rachael Fee, MD   500 mg at 02/22/13 0733  . DULoxetine (CYMBALTA) DR capsule 60 mg  60 mg Oral Daily Rachael Fee, MD   60 mg at 02/22/13 0733  . folic acid (FOLVITE) tablet 1 mg  1 mg Oral Daily Earney Navy, NP   1 mg at 02/22/13 0734  . gabapentin (NEURONTIN) capsule 900 mg  900 mg Oral TID Pamella Pert, MD   900 mg at 02/22/13 1311  . magnesium hydroxide (MILK OF MAGNESIA) suspension 30 mL  30 mL Oral Daily PRN Earney Navy, NP      . metoprolol  succinate (TOPROL-XL) 24 hr tablet 50 mg  50 mg Oral Daily Pamella Pert, MD   50 mg at 02/22/13 0734  . multivitamin with minerals tablet 1 tablet  1 tablet Oral Daily Earney Navy, NP   1 tablet at 02/22/13 0734  . nicotine polacrilex (NICORETTE) gum 2 mg  2 mg Oral PRN Rachael Fee, MD   2 mg at 02/22/13 1311  . oxyCODONE (Oxy IR/ROXICODONE) immediate release tablet 10 mg  10 mg Oral TID Rachael Fee, MD   10 mg at 02/22/13 1311  . potassium chloride SA (K-DUR,KLOR-CON) CR tablet 20 mEq  20 mEq Oral BID Fransisca Kaufmann, NP   20 mEq at 02/22/13 0733  . thiamine (VITAMIN B-1) tablet 100 mg  100 mg Oral Daily Earney Navy, NP   100 mg at 02/22/13 0733  . tiZANidine (ZANAFLEX) tablet 4 mg  4 mg Oral Q8H PRN Pamella Pert, MD   4 mg at 02/22/13 0557  . traMADol (ULTRAM) tablet 50 mg  50 mg Oral BID PRN Belkys A Regalado, MD   50 mg at 02/21/13 1304  . traZODone (DESYREL) tablet 50 mg  50 mg Oral QHS PRN,MR X 1 Earney Navy, NP   50 mg at 02/21/13 2209    Lab Results: No results found for this or any previous visit (from the past 48 hour(s)).  Physical Findings: AIMS: Facial and Oral Movements Muscles of Facial Expression: None, normal Lips and Perioral Area: None, normal Jaw: None, normal Tongue: None, normal,Extremity Movements Upper (arms, wrists, hands, fingers): None, normal Lower (legs, knees, ankles, toes): None, normal, Trunk Movements Neck, shoulders, hips: None, normal, Overall Severity Severity of abnormal movements (highest score from questions above): None, normal Incapacitation due to abnormal movements: None, normal Patient's awareness of abnormal movements (rate only patient's report): No Awareness, Dental Status Current problems with teeth and/or dentures?: No Does patient usually wear dentures?: No  CIWA:  CIWA-Ar Total: 0 COWS:  COWS Total Score: 5  Treatment Plan Summary: Daily contact with patient to assess and evaluate symptoms and progress in  treatment Medication management  Plan: Supportive approach/coping skills/relapse prevention. Address co-morbidities. Continue 1:1 supervision. Encouraged out of room, participation in group sessions and application of coping skills when distressed. Will continue to monitor response to/adverse effects of medications in use to assure effectiveness. Continue to monitor mood, behavior and interaction with staff and other patients. Continue discharge plans. Continue current plan of care.  Medical Decision Making Problem Points:  Established problem, stable/improving (1) and Review of psycho-social stressors (1) Data Points:  Review or order clinical lab tests (1) Review of medication regiment & side effects (2)  I certify that inpatient services furnished can reasonably be expected to improve the patient's condition.   Armandina Stammer I, PMHNP-BC 02/22/2013, 1:37 PM

## 2013-02-22 NOTE — Progress Notes (Signed)
RN 1:1 Continuous Observation Note  Patient resting quietly in room at this time. Respirations even and unlabored, patient appears in no distress at this time. MHT at bedside. Patient safe on unit. Educated patient re increased risk for falls and calling for help before attempting to transfer from wheelchair. Patient verbalized understanding. Will continue to monitor.

## 2013-02-22 NOTE — Progress Notes (Signed)
Recreation Therapy Notes  Date: 06.19.2014 Time: 3:00pm Location: [address] Dayroom      Group Topic/Focus: Communication, Journalist, newspaper, Team Work  Participation Level: Active  Participation Quality: Appropriate  Affect: Euhtymic  Cognitive: Appropriate   Additional Comments: Activity: Landing Pad ; Explanation: Patients worked as a team. Patients were given 10 drinking straws and a length of masking tape. Using the straws and the medical tape patients were asked to create a landing pad that would catch a golf ball dropped from approximately 6 feet.    Patient arrived to group session at approximately 3:10pm. Patient actively participated in group activity. Patient assisted peers with building landing pad, eventually taking over project for group. Patient fixated on receiving more supplies to complete the project. LRT explained several times the group was given all the supplies they needed when the group began. Patient was not able to relate activity to life post d/c, except to state it was important to show up on time to receive all necessary instructions.   Marykay Lex Milas Schappell, LRT/CTRS  Tonnie Friedel L 02/22/2013 4:09 PM

## 2013-02-22 NOTE — Progress Notes (Signed)
1:1 RN Continuous Observation Note  D) Patient observed in group, patient sitting quietly in wheelchair. MHT at patient's side. Patient engaging in group appropriately. Patient respirations even and unlabored, patient appears in no distress at this time.   A) Patient reminded of increased risk for falls, patient verbalizes understanding. Patient verbalizes "I only have a problem when I try to stand up." Patient agrees not to attempt to stand/ambulate without first asking for assistance. MHT verbalizes understanding of patient's safety plan.   R) Patient safe on unit, denies SI/HI, denies AVH. Patient offered support and encouragement. Will continue to monitor.

## 2013-02-22 NOTE — Discharge Planning (Signed)
Over the past week, CSW has contacted [facility] in [place] and [place], [employer], [facility], [facility], [facility], [facility], [place], and [place] in order to establish stable placement for this pt. This patient is unable to return to his previous ALF due to behavioral issues. The shelters in [place], [place], and [place] could not accept pt either due to pt being in wheelchair or pt being evicted in the past due to behavior. [facility] could not accept pt due not meeting age requirment. [facility] contacted today. Info sent. Their minimum age is 24 and CSW asked for them to consider pt due to dire need for stable placement. Currently waiting for decision. [facility] Coordinator Supervisor Neomia Dear [phone] also contacted in order to provide assistance with placement for this pt. VM left for Robin.

## 2013-02-22 NOTE — Progress Notes (Signed)
D   Pt has been asleep for the past two hours but has woken up and is in good spirits   He denies suicidal ideation   He continues to be a high fall risk due to his disuse of his legs   He does reposition himself in bed as he has good upper body strength    A   Verbal support given  Medications administered and effectiveness monitored   1:1 sitter R   Pt safe at present

## 2013-02-22 NOTE — Progress Notes (Signed)
Patient did not attend the evening karaoke group.  

## 2013-02-22 NOTE — Progress Notes (Signed)
1800 note d/t 1-1. Using w/c & staff within arms reach of pt.Affect blunted & mood sad. A: medicated @ 1720 for spasms. Supported & encouraged.R: Pt safety maintained.

## 2013-02-22 NOTE — Progress Notes (Signed)
Adult Psychoeducational Group Note  Date:  02/22/2013 Time:  2:06 PM  Group Topic/Focus:  Overcoming Stress:   The focus of this group is to define stress and help patients assess their triggers.  Participation Level:  Did Not Attend  Participation Quality:  Did not attend  Affect:  did not attend  Cognitive:  did not attend  Insight: None  Engagement in Group:  None  Modes of Intervention:  Discussion, Education and Support  Additional Comments:  Jeffrey [name] did not attend group.   Nichola Sizer 02/22/2013, 2:06 PM

## 2013-02-23 MED ORDER — BACITRACIN-NEOMYCIN-POLYMYXIN 400-5-5000 EX OINT
TOPICAL_OINTMENT | Freq: Two times a day (BID) | CUTANEOUS | Status: DC
  Administered 2013-02-23: 1 via TOPICAL
  Administered 2013-02-24 – 2013-02-25 (×2): via TOPICAL
  Administered 2013-02-26: 1 via TOPICAL

## 2013-02-23 MED ORDER — BACITRACIN-NEOMYCIN-POLYMYXIN OINTMENT TUBE
TOPICAL_OINTMENT | Freq: Two times a day (BID) | CUTANEOUS | Status: DC
  Filled 2013-02-23: qty 15

## 2013-02-23 NOTE — Progress Notes (Signed)
Adult Psychoeducational Group Note  Date:  02/23/2013 Time:  11:00AM Group Topic/Focus:  Relapse Prevention Planning:   The focus of this group is to define relapse and discuss the need for planning to combat relapse.  Participation Level:  Active  Participation Quality:  Appropriate and Attentive  Affect:  Appropriate  Cognitive:  Alert and Appropriate  Insight: Appropriate  Engagement in Group:  Engaged  Modes of Intervention:  Discussion  Additional Comments:  Pt. Was attentive and appropriate during today's group discussion. Pt shared that he need to change is living environment and have better medication management. Pt stated that he is willing to try new things to get the needed help and have better attitude.   Bing Plume D 02/23/2013, 11:39 AM

## 2013-02-23 NOTE — BHH Group Notes (Signed)
[facility] LCSW Group Therapy  02/23/2013 1:53 PM  Type of Therapy:  Group Therapy  Participation Level:  Did Not Attend  Smart, Lukka Black 02/23/2013, 1:53 PM

## 2013-02-23 NOTE — Progress Notes (Signed)
Patient ID: Jeffrey [name], male   DOB: November 21, 1955, 57 y.o.   MRN: [id number]  See Paper chart for notes

## 2013-02-23 NOTE — Progress Notes (Signed)
D:  Per pt self inventory pt reports sleeping is fair, appetite is good, energy level is low, rates depression at a 8  , rates hopelessness at a 9, denies SI/HI/AVH. " I'm used to taking so much more than what your giving me." "I  feel like such  burden here everyone has been taking care of me."  A:  Support and encouragement provided, encouraged pt to attend all groups and activities, q15 minute checks continued for safety. 1:1 continuous sitter for safety reasons. Remains in wheel chair. Requested and was given librium 25mg  po  R:  Pt is still c/o back pain . Reports only only slight relief from librium. Encouraged to think of alternatives to ETOH and to be more positive.
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
## 2013-02-23 NOTE — Progress Notes (Addendum)
Pt states he is not SI or HI but is ready to leave here so he can collect medicaid and start drinking. He stated," 3.00 will buy me  A lot."Pt has what he states are spider bites times one month on his left upper thigh.. Both are the size of a dime and have bloody exudate. Dressing are on both areas and pt was instructed to  Pick the scabs. Pt remains a 1:1 for fall risk. He does go into the dayroom and the hallway via wheelchair. Pt stated,"I think I have a UTI ". Denies urinary frequency, burning or bleeding.Pt. Does have chronic back pain. Pt instructed to tell the nurse the next time he voids so a urine can be sent off. Pt denies hearing voices or any visual hallucinations, Urine obtained and [placed in refrig. Until lab picks up the specimen.

## 2013-02-23 NOTE — Progress Notes (Signed)
Patient ID: Jeffrey [name], male   DOB: [birth year], 57 y.o.   MRN: [id number] Patient ID: Jeffrey [name], male   DOB: 1956-03-23, 57 y.o.   MRN: [id number] Patient ID: Jeffrey [name], male   DOB: 07-Aug-1956, 57 y.o.   MRN: [id number] Patient ID: Jeffrey [name], male   DOB: 08-11-1956, 57 y.o.   MRN: [id number] Patient ID: Jeffrey [name], male   DOB: November 25, 1955, 57 y.o.   MRN: [id number] [facility] MD Progress Note  02/23/2013 2:27 PM Jeffrey [name]  MRN:  [id number]  Subjective:  Jeffrey [name] reports "I know I'm going to be safe for the next 2 days for being here. I still feel like I'm taking up spaces here. There other patients who are worse off than I'm that need the bed. This is how I feel about the whole thing. The social worker still have not find me a place to live still. That depresses me". Continue to endorse suicidal thoughts.  O: Patient was observed picking and digging on his thigh areas by his 1:1 sitter. He picked open some sores, that will require some wound care and application of dry dressings. Ordered some application of Neosporin ointment.  Diagnosis:    Axis I: Alcohol Dependence, Bipolar Disorder Axis II: Deferred Axis III:  Past Medical History  Diagnosis Date  . DDD (degenerative disc disease)   . Necrotizing fasciitis   . Degenerative disc disease   . Hypertension   . Degenerative disc disease   . Hepatitis C   . Chronic pain   . DDD (degenerative disc disease)   . Parkinson disease   . Mental disorder   . Depression   . DDD (degenerative disc disease)   . DDD (degenerative disc disease), lumbar     ADL's:  Impaired  Sleep: Good  Appetite:  Good  Suicidal Ideation:  Plan: Varies Means:  No Homicidal Ideation:  Patient denies any thought, plan, or intent  AEB (as evidenced by): Per patient reports.  Psychiatric Specialty Exam: Review of Systems  Constitutional: Negative.   HENT: Positive for neck pain.   Eyes: Negative.   Respiratory: Negative.    Cardiovascular: Negative.   Gastrointestinal: Negative.   Genitourinary: Negative.   Musculoskeletal: Positive for myalgias, back pain and joint pain.  Skin:       Some open sores to left thigh areas.   Endo/Heme/Allergies: Negative.   Psychiatric/Behavioral: Positive for depression, suicidal ideas and substance abuse. Negative for hallucinations. The patient is nervous/anxious. The patient does not have insomnia.     Blood pressure 121/77, pulse 65, temperature 97 F (36.1 C), temperature source Oral, resp. rate 16, height 6\' 4"  (1.93 m), weight 99.791 kg (220 lb), SpO2 98.00%.Body mass index is 26.79 kg/(m^2).  General Appearance: Disheveled  Eye Solicitor::  Fair  Speech:  Clear and Coherent  Volume:  Normal  Mood:  Anxious, Depressed and Irritable  Affect:  Congruent  Thought Process:  Circumstantial  Orientation:  Full (Time, Place, and Person)  Thought Content:  Ruminations  Suicidal Thoughts:  Yes, off and on without plans  Homicidal Thoughts:  No  Memory:  Immediate;   Good Recent;   Good Remote;   Good  Judgement:  Fair  Insight:  Lacking  Psychomotor Activity:  Decreased  Concentration:  Good  Recall:  Good  Akathisia:  No  Handed:  Right  AIMS (if indicated):     Assets:  Communication Skills Desire for Improvement  Sleep:  Number of Hours: 5   Current Medications: Current Facility-Administered  Medications  Medication Dose Route Frequency Provider Last Rate Last Dose  . acetaminophen (TYLENOL) tablet 650 mg  650 mg Oral Q6H PRN Earney Navy, NP   650 mg at 02/19/13 1901  . alum & mag hydroxide-simeth (MAALOX/MYLANTA) 200-200-20 MG/5ML suspension 30 mL  30 mL Oral Q4H PRN Earney Navy, NP      . chlordiazePOXIDE (LIBRIUM) capsule 25 mg  25 mg Oral Q6H PRN Kerry Hough, PA-C   25 mg at 02/23/13 0810  . divalproex (DEPAKOTE) DR tablet 500 mg  500 mg Oral Q12H Rachael Fee, MD   500 mg at 02/23/13 4098  . DULoxetine (CYMBALTA) DR capsule 60 mg  60  mg Oral Daily Rachael Fee, MD   60 mg at 02/23/13 1191  . folic acid (FOLVITE) tablet 1 mg  1 mg Oral Daily Earney Navy, NP   1 mg at 02/23/13 0806  . gabapentin (NEURONTIN) capsule 900 mg  900 mg Oral TID Pamella Pert, MD   900 mg at 02/23/13 1206  . magnesium hydroxide (MILK OF MAGNESIA) suspension 30 mL  30 mL Oral Daily PRN Earney Navy, NP      . metoprolol succinate (TOPROL-XL) 24 hr tablet 50 mg  50 mg Oral Daily Pamella Pert, MD   50 mg at 02/23/13 0806  . multivitamin with minerals tablet 1 tablet  1 tablet Oral Daily Earney Navy, NP   1 tablet at 02/23/13 0806  . neomycin-bacitracin-polymyxin (NEOSPORIN) ointment   Topical BID Sanjuana Kava, NP      . nicotine polacrilex (NICORETTE) gum 2 mg  2 mg Oral PRN Rachael Fee, MD   2 mg at 02/23/13 1055  . oxyCODONE (Oxy IR/ROXICODONE) immediate release tablet 10 mg  10 mg Oral TID Rachael Fee, MD   10 mg at 02/23/13 1400  . potassium chloride SA (K-DUR,KLOR-CON) CR tablet 20 mEq  20 mEq Oral BID Fransisca Kaufmann, NP   20 mEq at 02/23/13 0805  . thiamine (VITAMIN B-1) tablet 100 mg  100 mg Oral Daily Earney Navy, NP   100 mg at 02/23/13 0806  . tiZANidine (ZANAFLEX) tablet 4 mg  4 mg Oral Q8H PRN Pamella Pert, MD   4 mg at 02/22/13 1727  . traMADol (ULTRAM) tablet 50 mg  50 mg Oral BID PRN Belkys A Regalado, MD   50 mg at 02/21/13 1304  . traZODone (DESYREL) tablet 50 mg  50 mg Oral QHS PRN,MR X 1 Earney Navy, NP   50 mg at 02/22/13 2331    Lab Results: No results found for this or any previous visit (from the past 48 hour(s)).  Physical Findings: AIMS: Facial and Oral Movements Muscles of Facial Expression: None, normal Lips and Perioral Area: None, normal Jaw: None, normal Tongue: None, normal,Extremity Movements Upper (arms, wrists, hands, fingers): None, normal Lower (legs, knees, ankles, toes): None, normal, Trunk Movements Neck, shoulders, hips: None, normal, Overall Severity Severity of  abnormal movements (highest score from questions above): None, normal Incapacitation due to abnormal movements: None, normal Patient's awareness of abnormal movements (rate only patient's report): No Awareness, Dental Status Current problems with teeth and/or dentures?: No Does patient usually wear dentures?: No  CIWA:  CIWA-Ar Total: 0 COWS:  COWS Total Score: 5  Treatment Plan Summary: Daily contact with patient to assess and evaluate symptoms and progress in treatment Medication management  Plan: Supportive approach/coping skills/relapse prevention. Neosporin ointment application to open sores to  thigh areas bid Address co-morbidities. Continue 1:1 supervision. Encouraged out of room, participation in group sessions and application of coping skills when distressed. Will continue to monitor response to/adverse effects of medications in use to assure effectiveness. Continue to monitor mood, behavior and interaction with staff and other patients. Continue discharge plans. Continue current plan of care.  Medical Decision Making Problem Points:  Established problem, stable/improving (1) and Review of psycho-social stressors (1) Data Points:  Review or order clinical lab tests (1) Review of medication regiment & side effects (2)  I certify that inpatient services furnished can reasonably be expected to improve the patient's condition.   Armandina Stammer I, PMHNP-BC 02/23/2013, 2:27 PM

## 2013-02-23 NOTE — Progress Notes (Signed)
Addendum: pt was picking at sores from bug bites and old scabs, blood on sheets and all over his hands. " What's the big deal I'm a picker."  Pt. Insight is poor, along with hygiene. MHT's assisted pt with shower. Cleaned opened areas and applied telfa dsg. NP made aware and evaluated opened areas.

## 2013-02-23 NOTE — Tx Team (Signed)
Interdisciplinary Treatment Plan Update (Adult)  Date: 02/23/2013   Time Reviewed: 10:01 AM  Progress in Treatment:  Attending groups: Yes Participating in groups: Yes Taking medication as prescribed: Yes  Tolerating medication: Yes  Family/Significant othe contact made: No  Patient understands diagnosis: Yes, AEB seeking help with substance abuse and depression  Discussing patient identified problems/goals with staff: Intermittently  Medical problems stabilized or resolved: Yes  Denies suicidal/homicidal ideation: Yes Patient has not harmed self or Others: Yes  New problem(s) identified: Pain management is an issue for this patient.  Discharge Plan or Barriers: Set up through [facility] for transitional care. Several ALf's, shelters, and nursing homes contacted in effort to seek placement for this patient. Pt does not meet criteria for any facilities contacted. Possible referral to [facility] with doctor order. Currently waiting to hear back from two ALF's [facility] and [facility]).  Additional comments: n/a  Reason for Continuation of Hospitalization:  Depression  Medication management  Pain management Estimated length of stay: 3-5 days For review of initial/current patient goals, please see plan of care.  Attendees:  Patient:    Family:    Physician: Aggie PA 02/23/2013 10:04 AM   Nursing:    Clinical Social Worker [employer], LCSWA  02/23/2013 10:04 AM   Other:    Other:    Other:    Other:    Scribe for Treatment Team:  Trula Slade LCSWA, 02/23/2013 10:04 AM

## 2013-02-23 NOTE — Progress Notes (Signed)
Adult Psychoeducational Group Note  Date:  02/23/2013 Time:  12:53 PM  Group Topic/Focus:  Therapeutic Activity   Participation Level:  Active  Participation Quality:  Appropriate and Attentive  Affect:  Appropriate and Flat  Cognitive:  Alert and Appropriate  Insight: Good  Engagement in Group:  Engaged  Modes of Intervention:  Activity, Education and Socialization  Additional Comments:  Pts played Pictionary during group. Pt was attentive, appropriate and active during group. Pts affect was flat throughout group but socialized with other pts in group.   Dalia Heading 02/23/2013, 12:53 PM

## 2013-02-23 NOTE — Discharge Planning (Signed)
Spoke with Cordelia Pen at [facility] about pt eligibility for placement. Cordelia Pen stated that pt is too much of a risk due to alcohol and mental health issues and would not be and appropriate candidate for this facility. [facility] declined pt due to pt not meeting age criteria. CSW called [facility] and was told pt does not qualify due to pt's inability to transport himself in and out of wheelchair. CSW contacted Marcelino Duster at [facility] and was referred to [email or web address] for [place] who is contact person.

## 2013-02-23 NOTE — Progress Notes (Signed)
Adult Psychoeducational Group Note  Date:  02/23/2013 Time:  8:00PM  Group Topic/Focus:  Wrap up Group  Participation Level:  Active  Participation Quality:  Appropriate and Attentive  Affect:  Appropriate  Cognitive:  Alert and Appropriate  Insight: Appropriate  Engagement in Group:  Engaged  Modes of Intervention:  Video  Additional Comments: Pt was able to watch video on [facility] before Recovery first steps to AA  Bing Plume D 02/23/2013, 8:36 PM

## 2013-02-23 NOTE — Progress Notes (Signed)
D:  Denies SI/HI/AVH. C/o severe low back pain and ineffective medications. "I'll shower now that I've had my meds."  A:  Support and encouragement provided, encouraged pt to attend all groups and activities, q15 minute checks continued for safety. Encouraged to shower   R:  Pt is lying in bed, 1;1 maintained continuous with sitter . Continued to offer emotional support.

## 2013-02-23 NOTE — [facility] Group Notes (Signed)
[facility] LCSW Aftercare Discharge Planning Group Note   02/23/2013 9:26 AM  Participation Quality:  Appropriate  Mood/Affect:  Blunted  Depression Rating:  5  Anxiety Rating:  5  Thoughts of Suicide:  No Will you contract for safety?   NA  Current AVH:  No  Plan for Discharge/Comments:  Pt made aware that CSW is waiting to hear back from two ALFs about possible admission. Pt does not meet admission criteria but Alf's have agreed to review his information. Possible referral to [facility] with doctor approval.  Transportation Means: unknown   Supports: Environmental education officer, [employer]

## 2013-02-24 LAB — URINALYSIS, ROUTINE W REFLEX MICROSCOPIC
Bilirubin Urine: NEGATIVE
Hgb urine dipstick: NEGATIVE
Ketones, ur: NEGATIVE mg/dL
Nitrite: NEGATIVE
pH: 7 (ref 5.0–8.0)

## 2013-02-24 NOTE — Progress Notes (Signed)
D.  Pt pleasant on approach, states that he wants Librium with his night time medications.  Pt states that he has been having pain with urination and was tested for a UTI but that the test was negative.  Pt feels that he may have a kidney stone.   Pt denies SI/HI/hallucinations at this time.  Pt is positive for evening AA group.  Pt is interacting appropriately within milieu.  A.  Support and encouragement offered, Verne Spurr, PA made aware of urinary pain.  If Pt has continued and worsening pain he is to be sent to the ED to be checked out.  R.  Pt does not wish to go to ED at this time, will continue to monitor.

## 2013-02-24 NOTE — Progress Notes (Signed)
1:1 Nursing  D.  Pt states he is experiencing urination every 5 minutes and that he is continuing to have pain with urination.  A.  Spoke to [employer], [place] who gave the order that Pt could go to ED if he felt that this is something new for him.  Support and encouragement offered, 1:1 continued as ordered for Pt safety.  R.  Pt refused ED stating that he will wait and discuss with doctor tomorrow.  Pt felt that with his Oxy IR tonight he will be okay to make it through the night  R.  Advised Pt to let us know if symptoms worsen or if blood is visible in urine.  Will continue to monitor.

## 2013-02-24 NOTE — Progress Notes (Signed)
Patient ID: Jeffrey [name], male   DOB: Oct 04, 1955, 57 y.o.   MRN: [id number] [facility] MD Progress Note 02/24/2013 10:40 PM  Jeffrey [name]  MRN:  [id number]  Subjective: Patient states he still has pain, states it is severe, >10/10. Requesting to go to the ED due to increased urinary frequency. Objective: patient continues to request more pain medication > than his behavior would indicate. Appears calm, but voices multiple somatic complaints.  Patient is told that he can go to the ED, but will be discharged out from [facility] and not readmitted.  He continues to show no motivation for sobriety. Continues to engage in medication seeking behaviors, is flaunting his current prescription in front of other patients currently detoxing.  Diagnosis:    Axis I: Alcohol Dependence, Bipolar Disorder Axis II: Deferred Axis III:  Past Medical History  Diagnosis Date  . DDD (degenerative disc disease)   . Necrotizing fasciitis   . Degenerative disc disease   . Hypertension   . Degenerative disc disease   . Hepatitis C   . Chronic pain   . DDD (degenerative disc disease)   . Parkinson disease   . Mental disorder   . Depression   . DDD (degenerative disc disease)   . DDD (degenerative disc disease), lumbar     ADL's:  Impaired  Sleep: Good  Appetite:  Good  Suicidal Ideation:  Plan: Varies Means:  No Homicidal Ideation:  Patient denies any thought, plan, or intent  AEB (as evidenced by): Per patient reports.  Psychiatric Specialty Exam: Review of Systems  Constitutional: Negative.   HENT: Positive for neck pain.   Eyes: Negative.   Respiratory: Negative.   Cardiovascular: Negative.   Gastrointestinal: Negative.   Genitourinary: Negative.   Musculoskeletal: Positive for myalgias, back pain and joint pain.  Skin:       Some open sores to left thigh areas.   Endo/Heme/Allergies: Negative.   Psychiatric/Behavioral: Positive for depression, suicidal ideas and substance abuse. Negative for  hallucinations. The patient is nervous/anxious. The patient does not have insomnia.     Blood pressure 121/77, pulse 65, temperature 97 F (36.1 C), temperature source Oral, resp. rate 16, height 6\' 4"  (1.93 m), weight 99.791 kg (220 lb), SpO2 98.00%.Body mass index is 26.79 kg/(m^2).  General Appearance: fairly groomed  Patent attorney::  Fair  Speech:  Clear and Coherent  Volume:  Normal  Mood:  Anxious, but euthymic   Affect:  Incongruent with current reports of severe pain  Thought Process:  Goal directed toward pain medication.  Orientation:  Full (Time, Place, and Person)  Thought Content:  Ruminations  Suicidal Thoughts:  Yes, off and on without plans  Homicidal Thoughts:  No  Memory:  Immediate;   Good Recent;   Good Remote;   Good  Judgement:  Fair  Insight:  Lacking  Psychomotor Activity:  Decreased  Concentration:  Good  Recall:  Good  Akathisia:  No  Handed:  Right  AIMS (if indicated):     Assets:  Communication Skills Desire for Improvement  Sleep:  Number of Hours: 5   Current Medications: Current Facility-Administered Medications  Medication Dose Route Frequency Provider Last Rate Last Dose  . acetaminophen (TYLENOL) tablet 650 mg  650 mg Oral Q6H PRN Earney Navy, NP   650 mg at 02/19/13 1901  . alum & mag hydroxide-simeth (MAALOX/MYLANTA) 200-200-20 MG/5ML suspension 30 mL  30 mL Oral Q4H PRN Earney Navy, NP      . chlordiazePOXIDE (LIBRIUM)  capsule 25 mg  25 mg Oral Q6H PRN Kerry Hough, PA-C   25 mg at 02/23/13 0810  . divalproex (DEPAKOTE) DR tablet 500 mg  500 mg Oral Q12H Rachael Fee, MD   500 mg at 02/23/13 1610  . DULoxetine (CYMBALTA) DR capsule 60 mg  60 mg Oral Daily Rachael Fee, MD   60 mg at 02/23/13 9604  . folic acid (FOLVITE) tablet 1 mg  1 mg Oral Daily Earney Navy, NP   1 mg at 02/23/13 0806  . gabapentin (NEURONTIN) capsule 900 mg  900 mg Oral TID Pamella Pert, MD   900 mg at 02/23/13 1206  . magnesium hydroxide  (MILK OF MAGNESIA) suspension 30 mL  30 mL Oral Daily PRN Earney Navy, NP      . metoprolol succinate (TOPROL-XL) 24 hr tablet 50 mg  50 mg Oral Daily Pamella Pert, MD   50 mg at 02/23/13 0806  . multivitamin with minerals tablet 1 tablet  1 tablet Oral Daily Earney Navy, NP   1 tablet at 02/23/13 0806  . neomycin-bacitracin-polymyxin (NEOSPORIN) ointment   Topical BID Sanjuana Kava, NP      . nicotine polacrilex (NICORETTE) gum 2 mg  2 mg Oral PRN Rachael Fee, MD   2 mg at 02/23/13 1055  . oxyCODONE (Oxy IR/ROXICODONE) immediate release tablet 10 mg  10 mg Oral TID Rachael Fee, MD   10 mg at 02/23/13 1400  . potassium chloride SA (K-DUR,KLOR-CON) CR tablet 20 mEq  20 mEq Oral BID Fransisca Kaufmann, NP   20 mEq at 02/23/13 0805  . thiamine (VITAMIN B-1) tablet 100 mg  100 mg Oral Daily Earney Navy, NP   100 mg at 02/23/13 0806  . tiZANidine (ZANAFLEX) tablet 4 mg  4 mg Oral Q8H PRN Pamella Pert, MD   4 mg at 02/22/13 1727  . traMADol (ULTRAM) tablet 50 mg  50 mg Oral BID PRN Belkys A Regalado, MD   50 mg at 02/21/13 1304  . traZODone (DESYREL) tablet 50 mg  50 mg Oral QHS PRN,MR X 1 Earney Navy, NP   50 mg at 02/22/13 2331    Lab Results: No results found for this or any previous visit (from the past 48 hour(s)).  Physical Findings: AIMS: Facial and Oral Movements Muscles of Facial Expression: None, normal Lips and Perioral Area: None, normal Jaw: None, normal Tongue: None, normal,Extremity Movements Upper (arms, wrists, hands, fingers): None, normal Lower (legs, knees, ankles, toes): None, normal, Trunk Movements Neck, shoulders, hips: None, normal, Overall Severity Severity of abnormal movements (highest score from questions above): None, normal Incapacitation due to abnormal movements: None, normal Patient's awareness of abnormal movements (rate only patient's report): No Awareness, Dental Status Current problems with teeth and/or dentures?: No Does  patient usually wear dentures?: No  CIWA:  CIWA-Ar Total: 0 COWS:  COWS Total Score: 5  Treatment Plan Summary: Daily contact with patient to assess and evaluate symptoms and progress in treatment Medication management  Plan:  Supportive approach/coping skills/relapse prevention. Neosporin ointment application to open sores to thigh areas bid Address co-morbidities. Continue 1:1 supervision. Encouraged out of room, participation in group sessions and application of coping skills when distressed. Will continue to monitor response to/adverse effects of medications in use to assure effectiveness. Continue to monitor mood, behavior and interaction with staff and other patients. Continue discharge plans. Continue current plan of care. Recommend moving this patient to [address]  when a bed is available. Medical Decision Making Problem Points:  Established problem, stable/improving (1) and Review of psycho-social stressors (1) Data Points:  Review or order clinical lab tests (1) Review of medication regiment & side effects (2)  I certify that inpatient services furnished can reasonably be expected to improve the patient's condition.   Rona Ravens. Eunice Winecoff RPAC 10:46 PM 02/24/2013
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
## 2013-02-24 NOTE — Progress Notes (Signed)
0600 Nursing note: Pt remains on 1:1 observation for high fall risk. Pt in dayroom with MHT, drinking coffee. No complaints of pain or discomfort at this time. Q15 min safety checks and 1:1 monitoring continued for safety. Pt remains safe on the unit. Will continue to monitor pt.

## 2013-02-24 NOTE — Progress Notes (Signed)
0200 Nursing note: Pt remains on 1:1 observations for high fall risk. Pt is resting in bed with eyes closed, breathing even and unlabored. Q15 min safety checks and 1:1 monitoring continued for safety. Pt remains safe on the unit. Will continue to monitor pt.

## 2013-02-24 NOTE — Progress Notes (Signed)
D   Pt continues to be a high fall risk and needs assistance with all ADLs A   Pt on 1:1 R   Safe at present

## 2013-02-24 NOTE — Progress Notes (Signed)
Patient did attend the evening speaker AA meeting. Pt was in and out of group every five to ten minutes to go to the bathroom. Pt complained of frequent urination.

## 2013-02-24 NOTE — Progress Notes (Signed)
Psychoeducational Group Note  Date:  02/24/2013 Time:  0945 am  Group Topic/Focus:  Identifying Needs:   The focus of this group is to help patients identify their personal needs that have been historically problematic and identify healthy behaviors to address their needs. Positive thinking / attitude   Participation Level:  Did Not Attend  Andrena Mews 02/24/2013,10:05 AM

## 2013-02-24 NOTE — [facility] Group Notes (Signed)
[facility] LCSW Group Therapy  02/24/2013   Type of Therapy:  Group Therapy 10 to 11 AM  Participation Level:  Active  Participation Quality:  Appropriate  Affect:  Appropriate  Cognitive:  Alert and Oriented  Insight:  Developing/Improving  Engagement in Therapy:  Developing/Improving  Modes of Intervention:  Clarification, Discussion, Education, Rapport Building, Socialization and Support  Summary of Progress/Problems: Topic for group today was readiness for change and the six different stages (Precontemplation, Contemplation, Preparation, Action, Maintenance and Relapse) were then discussed and described by group. Asher described himself as being between the stages of pre contemplation and contemplation as "some what hopeful but not all invested as I have chronic pain and alcohol has been my only relief."  Patient was able to process that he "holds onto the option of suicide as I learned pain is the second greatest cause of suicide in the [place] and my pastor's friend with cancer committed suicide due to the pain.  IT was her belief that God would show mercy as she was in unbearable pain with no hope of getting better." Patient also reports that he "has been drinking large bottles of Listerine mouthwash, up to a gallon per day."  Harrill, Julious Payer

## 2013-02-24 NOTE — Progress Notes (Signed)
D   Pt remains wheelchair bound and has almost no use of his legs though he has good upper body strength  He needs assist with all adls and ambulation  Pt mood is stable  He appears to have some depression and anxiety he reports due to chronic pain   He attends some groups and is generally cooperative and compliant with treatment A   Verbal support given   Medications administered and effectiveness monitored   1:1 sitter R   Pt safe at present

## 2013-02-25 NOTE — [facility] Group Notes (Addendum)
[facility] LCSW Group Therapy  02/25/2013  10:00 AM   Type of Therapy:  Group Therapy  Participation Level:  Active  Participation Quality:  Appropriate and Attentive  Affect:  Appropriate, Flat and Depressed  Cognitive:  Alert and Appropriate  Insight:  Developing/Improving and Engaged  Engagement in Therapy:  Developing/Improving and Engaged  Modes of Intervention:  Clarification, Confrontation, Discussion, Education, Exploration, Limit-setting, Orientation, Problem-solving, Rapport Building, Dance movement psychotherapist, Socialization and Support  Summary of Progress/Problems: The main focus of today's process group was to identify the patient's current support system and decide on other supports that can be put in place.  An emphasis was placed on using counselor, doctor, therapy groups, 12-step groups, and problem-specific support groups to expand supports, as well as doing something different than has been done before.  Pt shared that he doesn't have family or friends so his support will be what we find for him.  Pt explained that he has a lot of medical issues and will need help with daily activities and will need help from a facility, such as a skilled nursing home.  Pt was able to process his inability to care for himself and need for added support when he leaves here.  Pt also shared that he was going to go to the ED last night but felt he would become suicidal for the treatment he feels he receives there, stating that they will just d/c him with a bus pass on the street at 3 AM.  Pt was an active participant in group discussion.    Jeffrey [name], [place] 02/25/2013 9:36 AM

## 2013-02-25 NOTE — Progress Notes (Signed)
  D   Pt is pleasant and appropriate  He attended and participated in group  He interacts appropriately with staff and peers   He is a high fall risk due to his disuse of both legs  A   Pt on 1:1 R   Safe at present

## 2013-02-25 NOTE — Progress Notes (Signed)
Patient did attend the evening speaker AA meeting.  

## 2013-02-25 NOTE — Progress Notes (Signed)
1:1 NURSING  D.  Pt has been sitting up in dayroom watching TV with peers.  No acute distress noted.  Continued to complain of chronic pain and continues to pick at scabs on body.  Positive for evening AA group.  Denies SI/HI/halluicnations at this time.  A.  Support and encouragement offered  R.  1:1 continued as ordered for Pt safety, Pt remains safe on unit

## 2013-02-25 NOTE — Progress Notes (Signed)
Patient ID: Jeffrey [name], male   DOB: 1956-05-14, 57 y.o.   MRN: [id number] Patient ID: Jeffrey [name], male   DOB: 11-09-1955, 57 y.o.   MRN: [id number] [facility] MD Progress Note 02/25/2013 2:39 PM  Dimitriy Carreras  MRN:  [id number]  Subjective: [name] reports that he was having urinary frequency/pain last night for every 3-5 minutes. He claimed it could be his gall stone. But he declined to go the ED last night because, he realized that the ED would just sent him home, instead of sending him back to [facility]. He claimed that he outsmarted everyone on that one. He rated his depression at #8 today. Says he will have to stay on pain pills because that is how he remains functional. He adds, "when you are in my situation, taking that much pain pills is a trade off".     Objective: Patient continues to request more pain medication > than his behavior would indicate. Appears calm, but voices multiple somatic complaints. Remains on1:1 supervision for safety. Mobility per use of wheel chair.  Diagnosis:    Axis I: Alcohol Dependence, Bipolar Disorder Axis II: Deferred Axis III:  Past Medical History  Diagnosis Date  . DDD (degenerative disc disease)   . Necrotizing fasciitis   . Degenerative disc disease   . Hypertension   . Degenerative disc disease   . Hepatitis C   . Chronic pain   . DDD (degenerative disc disease)   . Parkinson disease   . Mental disorder   . Depression   . DDD (degenerative disc disease)   . DDD (degenerative disc disease), lumbar     ADL's:  Impaired  Sleep: Good  Appetite:  Good  Suicidal Ideation:  Plan: Varies Means:  No Homicidal Ideation:  Patient denies any thought, plan, or intent  AEB (as evidenced by): Per patient reports.  Psychiatric Specialty Exam: Review of Systems  Constitutional: Negative.   HENT: Positive for neck pain.   Eyes: Negative.   Respiratory: Negative.   Cardiovascular: Negative.   Gastrointestinal: Negative.   Genitourinary:  Negative.   Musculoskeletal: Positive for myalgias, back pain and joint pain.  Skin:       Some open sores to left thigh areas.   Endo/Heme/Allergies: Negative.   Psychiatric/Behavioral: Positive for depression, suicidal ideas and substance abuse. Negative for hallucinations. The patient is nervous/anxious. The patient does not have insomnia.     Blood pressure 121/77, pulse 65, temperature 97 F (36.1 C), temperature source Oral, resp. rate 16, height 6\' 4"  (1.93 m), weight 99.791 kg (220 lb), SpO2 98.00%.Body mass index is 26.79 kg/(m^2).  General Appearance: fairly groomed  Patent attorney::  Fair  Speech:  Clear and Coherent  Volume:  Normal  Mood:  Anxious, but appears euthymic   Affect:  Incongruent with current reports of severe pain  Thought Process:  Goal directed toward pain medication.  Orientation:  Full (Time, Place, and Person)  Thought Content:  Ruminations  Suicidal Thoughts:  Yes, off and on without plans  Homicidal Thoughts:  No  Memory:  Immediate;   Good Recent;   Good Remote;   Good  Judgement:  Fair  Insight:  Lacking  Psychomotor Activity:  Decreased  Concentration:  Good  Recall:  Good  Akathisia:  No  Handed:  Right  AIMS (if indicated):     Assets:  Communication Skills Desire for Improvement  Sleep:  Number of Hours: 5   Current Medications: Current Facility-Administered Medications  Medication Dose Route Frequency Provider  Last Rate Last Dose  . acetaminophen (TYLENOL) tablet 650 mg  650 mg Oral Q6H PRN Earney Navy, NP   650 mg at 02/19/13 1901  . alum & mag hydroxide-simeth (MAALOX/MYLANTA) 200-200-20 MG/5ML suspension 30 mL  30 mL Oral Q4H PRN Earney Navy, NP      . chlordiazePOXIDE (LIBRIUM) capsule 25 mg  25 mg Oral Q6H PRN Kerry Hough, PA-C   25 mg at 02/23/13 0810  . divalproex (DEPAKOTE) DR tablet 500 mg  500 mg Oral Q12H Rachael Fee, MD   500 mg at 02/23/13 1914  . DULoxetine (CYMBALTA) DR capsule 60 mg  60 mg Oral Daily  Rachael Fee, MD   60 mg at 02/23/13 7829  . folic acid (FOLVITE) tablet 1 mg  1 mg Oral Daily Earney Navy, NP   1 mg at 02/23/13 0806  . gabapentin (NEURONTIN) capsule 900 mg  900 mg Oral TID Pamella Pert, MD   900 mg at 02/23/13 1206  . magnesium hydroxide (MILK OF MAGNESIA) suspension 30 mL  30 mL Oral Daily PRN Earney Navy, NP      . metoprolol succinate (TOPROL-XL) 24 hr tablet 50 mg  50 mg Oral Daily Pamella Pert, MD   50 mg at 02/23/13 0806  . multivitamin with minerals tablet 1 tablet  1 tablet Oral Daily Earney Navy, NP   1 tablet at 02/23/13 0806  . neomycin-bacitracin-polymyxin (NEOSPORIN) ointment   Topical BID Sanjuana Kava, NP      . nicotine polacrilex (NICORETTE) gum 2 mg  2 mg Oral PRN Rachael Fee, MD   2 mg at 02/23/13 1055  . oxyCODONE (Oxy IR/ROXICODONE) immediate release tablet 10 mg  10 mg Oral TID Rachael Fee, MD   10 mg at 02/23/13 1400  . potassium chloride SA (K-DUR,KLOR-CON) CR tablet 20 mEq  20 mEq Oral BID Fransisca Kaufmann, NP   20 mEq at 02/23/13 0805  . thiamine (VITAMIN B-1) tablet 100 mg  100 mg Oral Daily Earney Navy, NP   100 mg at 02/23/13 0806  . tiZANidine (ZANAFLEX) tablet 4 mg  4 mg Oral Q8H PRN Pamella Pert, MD   4 mg at 02/22/13 1727  . traMADol (ULTRAM) tablet 50 mg  50 mg Oral BID PRN Belkys A Regalado, MD   50 mg at 02/21/13 1304  . traZODone (DESYREL) tablet 50 mg  50 mg Oral QHS PRN,MR X 1 Earney Navy, NP   50 mg at 02/22/13 2331    Lab Results: No results found for this or any previous visit (from the past 48 hour(s)).  Physical Findings: AIMS: Facial and Oral Movements Muscles of Facial Expression: None, normal Lips and Perioral Area: None, normal Jaw: None, normal Tongue: None, normal,Extremity Movements Upper (arms, wrists, hands, fingers): None, normal Lower (legs, knees, ankles, toes): None, normal, Trunk Movements Neck, shoulders, hips: None, normal, Overall Severity Severity of abnormal  movements (highest score from questions above): None, normal Incapacitation due to abnormal movements: None, normal Patient's awareness of abnormal movements (rate only patient's report): No Awareness, Dental Status Current problems with teeth and/or dentures?: No Does patient usually wear dentures?: No  CIWA:  CIWA-Ar Total: 0 COWS:  COWS Total Score: 5  Treatment Plan Summary: Daily contact with patient to assess and evaluate symptoms and progress in treatment Medication management  Plan:  Supportive approach/coping skills/relapse prevention. Reviewed recent U/A reports on file 02/23/13. Neosporin ointment application to open sores  to thigh areas bid Address co-morbidities. Continue 1:1 supervision. Encouraged out of room, participation in group sessions and application of coping skills when distressed. Will continue to monitor response to/adverse effects of medications in use to assure effectiveness. Continue to monitor mood, behavior and interaction with staff and other patients. Continue discharge plans. Recommend moving this patient to [address] when a bed is available.  Medical Decision Making Problem Points:  Established problem, stable/improving (1) and Review of psycho-social stressors (1) Data Points:  Review or order clinical lab tests (1) Review of medication regiment & side effects (2)  I certify that inpatient services furnished can reasonably be expected to improve the patient's condition.  Sanjuana Kava, FNP. PMHNP-BC 2:39 PM 02/25/2013

## 2013-02-25 NOTE — Progress Notes (Signed)
D   Pt continues to be a high fall risk and has a unsteady gait with little use of both of his legs A   Pt is on a 1:1 R   Safe at present

## 2013-02-25 NOTE — Progress Notes (Signed)
Pt remains on a 1:1 due to being a high fall risk and and unsteady gait  He continues to be safe with staff assisting him with all adls and ambulation  He is confined to a wheelchair

## 2013-02-25 NOTE — Progress Notes (Signed)
Psychoeducational Group Note  Date:  02/25/2013 Time:  0945 am  Group Topic/Focus:  Making Healthy Choices:   The focus of this group is to help patients identify negative/unhealthy choices they were using prior to admission and identify positive/healthier coping strategies to replace them upon discharge.  Participation Level:  Active  Participation Quality:  Appropriate, Attentive, Sharing and Supportive  Affect:  Appropriate  Cognitive:  Alert and Appropriate  Insight:  Engaged  Engagement in Group:  Engaged  Additional Comments:    Andrena Mews 02/25/2013, 10:29 AM

## 2013-02-25 NOTE — Progress Notes (Signed)
1:1 NURSING  D.  Pt has been resting in bed with eyes closed, respirations even and unlabored.  No distress noted.  A.  1:1 continued as ordered for Pt safety  R.  Pt remains safe, will continue to monitor.

## 2013-02-25 NOTE — Progress Notes (Signed)
1:1 Nursing  D.  Pt has slept through the night, minimal tossing and turning.  Respirations even and unlabored, no acute distress noted.  A.  1:1 continued as ordered to maintain PT safety.  R.  Pt remains safe, will continue to monitor.

## 2013-02-26 ENCOUNTER — Emergency Department (HOSPITAL_COMMUNITY)
Admission: EM | Admit: 2013-02-26 | Discharge: 2013-02-27 | Disposition: A | Discharge status: Home / Self Care | Payer: Medicaid Other | Attending: Emergency Medicine | Admitting: Emergency Medicine

## 2013-02-26 ENCOUNTER — Encounter (HOSPITAL_COMMUNITY): Payer: Self-pay | Admitting: Emergency Medicine

## 2013-02-26 DIAGNOSIS — F1110 Opioid abuse, uncomplicated: Secondary | ICD-10-CM

## 2013-02-26 DIAGNOSIS — F17200 Nicotine dependence, unspecified, uncomplicated: Secondary | ICD-10-CM | POA: Insufficient documentation

## 2013-02-26 DIAGNOSIS — R3589 Other polyuria: Secondary | ICD-10-CM | POA: Insufficient documentation

## 2013-02-26 DIAGNOSIS — F329 Major depressive disorder, single episode, unspecified: Secondary | ICD-10-CM | POA: Insufficient documentation

## 2013-02-26 DIAGNOSIS — F323 Major depressive disorder, single episode, severe with psychotic features: Secondary | ICD-10-CM

## 2013-02-26 DIAGNOSIS — F3289 Other specified depressive episodes: Secondary | ICD-10-CM | POA: Insufficient documentation

## 2013-02-26 DIAGNOSIS — I10 Essential (primary) hypertension: Secondary | ICD-10-CM | POA: Insufficient documentation

## 2013-02-26 DIAGNOSIS — Z79899 Other long term (current) drug therapy: Secondary | ICD-10-CM | POA: Insufficient documentation

## 2013-02-26 DIAGNOSIS — T43591A Poisoning by other antipsychotics and neuroleptics, accidental (unintentional), initial encounter: Secondary | ICD-10-CM | POA: Insufficient documentation

## 2013-02-26 DIAGNOSIS — Y939 Activity, unspecified: Secondary | ICD-10-CM | POA: Insufficient documentation

## 2013-02-26 DIAGNOSIS — G8929 Other chronic pain: Secondary | ICD-10-CM | POA: Insufficient documentation

## 2013-02-26 DIAGNOSIS — Z8619 Personal history of other infectious and parasitic diseases: Secondary | ICD-10-CM | POA: Insufficient documentation

## 2013-02-26 DIAGNOSIS — R358 Other polyuria: Secondary | ICD-10-CM | POA: Insufficient documentation

## 2013-02-26 DIAGNOSIS — R350 Frequency of micturition: Secondary | ICD-10-CM | POA: Insufficient documentation

## 2013-02-26 DIAGNOSIS — N390 Urinary tract infection, site not specified: Secondary | ICD-10-CM

## 2013-02-26 DIAGNOSIS — Z8669 Personal history of other diseases of the nervous system and sense organs: Secondary | ICD-10-CM | POA: Insufficient documentation

## 2013-02-26 DIAGNOSIS — Y92009 Unspecified place in unspecified non-institutional (private) residence as the place of occurrence of the external cause: Secondary | ICD-10-CM | POA: Insufficient documentation

## 2013-02-26 DIAGNOSIS — Z8739 Personal history of other diseases of the musculoskeletal system and connective tissue: Secondary | ICD-10-CM | POA: Insufficient documentation

## 2013-02-26 DIAGNOSIS — Z8659 Personal history of other mental and behavioral disorders: Secondary | ICD-10-CM | POA: Insufficient documentation

## 2013-02-26 DIAGNOSIS — R300 Dysuria: Secondary | ICD-10-CM | POA: Insufficient documentation

## 2013-02-26 DIAGNOSIS — F322 Major depressive disorder, single episode, severe without psychotic features: Principal | ICD-10-CM

## 2013-02-26 LAB — CBC WITH DIFFERENTIAL/PLATELET
Basophils Absolute: 0.1 10*3/uL (ref 0.0–0.1)
Basophils Relative: 1 % (ref 0–1)
Eosinophils Absolute: 0.1 10*3/uL (ref 0.0–0.7)
Eosinophils Relative: 1 % (ref 0–5)
Lymphs Abs: 1.6 10*3/uL (ref 0.7–4.0)
MCH: 27.7 pg (ref 26.0–34.0)
MCHC: 31.8 g/dL (ref 30.0–36.0)
MCV: 86.9 fL (ref 78.0–100.0)
Neutrophils Relative %: 69 % (ref 43–77)
Platelets: 264 10*3/uL (ref 150–400)
RBC: 4.81 MIL/uL (ref 4.22–5.81)
RDW: 15.1 % (ref 11.5–15.5)

## 2013-02-26 LAB — URINE MICROSCOPIC-ADD ON

## 2013-02-26 LAB — COMPREHENSIVE METABOLIC PANEL
AST: 20 U/L (ref 0–37)
Albumin: 3.4 g/dL — ABNORMAL LOW (ref 3.5–5.2)
Alkaline Phosphatase: 77 U/L (ref 39–117)
CO2: 24 mEq/L (ref 19–32)
Chloride: 98 mEq/L (ref 96–112)
Creatinine, Ser: 0.81 mg/dL (ref 0.50–1.35)
GFR calc non Af Amer: >90 mL/min (ref >90)
Potassium: 4.9 mEq/L (ref 3.5–5.1)
Total Bilirubin: 0.1 mg/dL — ABNORMAL LOW (ref 0.3–1.2)

## 2013-02-26 LAB — URINALYSIS, ROUTINE W REFLEX MICROSCOPIC
Bilirubin Urine: NEGATIVE
Glucose, UA: NEGATIVE mg/dL
Protein, ur: NEGATIVE mg/dL
Urobilinogen, UA: 0.2 mg/dL (ref 0.0–1.0)

## 2013-02-26 LAB — RAPID URINE DRUG SCREEN, HOSP PERFORMED: Opiates: NOT DETECTED

## 2013-02-26 LAB — ETHANOL: Alcohol, Ethyl (B): 26 mg/dL — ABNORMAL HIGH (ref 0–11)

## 2013-02-26 LAB — ACETAMINOPHEN LEVEL: Acetaminophen (Tylenol), Serum: <15 ug/mL (ref 10–30)

## 2013-02-26 MED ORDER — POTASSIUM CHLORIDE CRYS ER 20 MEQ PO TBCR: 20.0000 meq | EXTENDED_RELEASE_TABLET | Freq: Two times a day (BID) | ORAL | Status: DC

## 2013-02-26 MED ORDER — OXYCODONE-ACETAMINOPHEN 5-325 MG PO TABS: 1.0000 | ORAL_TABLET | ORAL | Status: DC | PRN

## 2013-02-26 MED ORDER — DULOXETINE HCL 60 MG PO CPEP: 60.0000 mg | ORAL_CAPSULE | Freq: Every day | ORAL | Status: DC

## 2013-02-26 MED ORDER — TRAMADOL HCL 50 MG PO TABS: 50.0000 mg | ORAL_TABLET | Freq: Two times a day (BID) | ORAL | Status: DC | PRN

## 2013-02-26 MED ORDER — BACITRACIN-NEOMYCIN-POLYMYXIN 400-5-5000 EX OINT: TOPICAL_OINTMENT | Freq: Two times a day (BID) | CUTANEOUS | Status: DC

## 2013-02-26 MED ORDER — DIVALPROEX SODIUM 500 MG PO DR TAB: 500.0000 mg | DELAYED_RELEASE_TABLET | Freq: Two times a day (BID) | ORAL | Status: DC

## 2013-02-26 MED ORDER — DEXTROSE 5 % IV SOLN
1.0000 g | Freq: Once | INTRAVENOUS | Status: AC
  Administered 2013-02-27: 1 g via INTRAVENOUS
  Filled 2013-02-26: qty 10

## 2013-02-26 MED ORDER — GABAPENTIN 300 MG PO CAPS: 900.0000 mg | ORAL_CAPSULE | Freq: Three times a day (TID) | ORAL | Status: DC

## 2013-02-26 MED ORDER — THIAMINE HCL 100 MG PO TABS: 100.0000 mg | ORAL_TABLET | Freq: Every day | ORAL | Status: DC

## 2013-02-26 MED ORDER — METOPROLOL SUCCINATE ER 25 MG PO TB24: 25.0000 mg | ORAL_TABLET | Freq: Every day | ORAL | Status: DC

## 2013-02-26 MED ORDER — TIZANIDINE HCL 4 MG PO TABS: 4.0000 mg | ORAL_TABLET | Freq: Three times a day (TID) | ORAL | Status: DC | PRN

## 2013-02-26 MED ORDER — TRAZODONE HCL 50 MG PO TABS: 50.0000 mg | ORAL_TABLET | Freq: Every evening | ORAL | Status: DC | PRN

## 2013-02-26 NOTE — Discharge Planning (Signed)
[facility] contacted two times this morning in attempt to get in touch with [employer]. Recpetionist stated that they do not know when Damian Leavell will be available. Jeffrey [name] is discharging today, possibly to [facility] (he needs assistance with getting another ID). [facility] called for transport. They will not transport him being that he is not in their system and it will take at least 3 weeks to get him approved for services. [employer] contacted. They could not transport pt because he was not transported to the hospital by them. [facility] caregivers were contacted by CSW. They will charge $20 an hr plus $.55 per mile--approved by [facility]. Erie Noe from [facility] will come at 3:30 PM to transport pt to desired location. [phone]. Pt will be given information about [facility] in order to follow up. Pt provided information for shelters/IRC/ALFs in discharge packet. He is aware that shelters and ALFs have been contacted and did not approve him for admission.

## 2013-02-26 NOTE — BHH Group Notes (Signed)
[facility] LCSW Aftercare Discharge Planning Group Note   02/26/2013 9:13 AM  Participation Quality:  Appropriate  Mood/Affect:  Appropriate  Depression Rating:  7  Anxiety Rating:  7  Thoughts of Suicide:  No Will you contract for safety?   NA  Current AVH:  No  Plan for Discharge/Comments:  CSW to call [facility] in [place] at 10am to speak with Lenice Pressman (administrator) in order to find out if pt meets criteria for admission. Otherwise, pt to be d/c today. Exhausted all other resources.   Transportation Means: unknown at this time.   Supports: Transitional care team [employer], [place]

## 2013-02-26 NOTE — ED Notes (Signed)
Pt presenting to ed with c/o frequent urination pt states he was sent here from behavioral health

## 2013-02-26 NOTE — Progress Notes (Signed)
Adult Psychoeducational Group Note  Date:  02/26/2013 Time:  11:29 AM  Group Topic/Focus:  Wellness Toolbox:   The focus of this group is to discuss various aspects of wellness, balancing those aspects and exploring ways to increase the ability to experience wellness.  Patients will create a wellness toolbox for use upon discharge.  Participation Level:  Minimal  Participation Quality:  Appropriate  Affect:  Appropriate  Cognitive:  Appropriate  Insight: Appropriate  Engagement in Group:  Engaged  Modes of Intervention:  Discussion  Additional Comments: Pt attended group toward the end but was appropriate and sharing. 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  Sharyn Lull 02/26/2013, 11:29 AM

## 2013-02-26 NOTE — ED Notes (Signed)
Spoke with Onalee Hua at [employer]. Monitor patient for concern of: Drowsiness, hypotension, QT prolongation, bradycardia. Unknown ingestion time: suspected to be @2145 . 8- 50mg  trazadone

## 2013-02-26 NOTE — Progress Notes (Signed)
See 0200 1:1 note in [facility] flow sheet  Pt was changed, along with bed linens which were completely soaked.  It required three staff members to get Pt dressed and cleaned up properly as he can offer little assistance.  Lotion was applied to Pt's back as well as skin protectant to wound on back.  Dressings were changed to left upper and lower leg as they were soaked with urine.  Pt tolerated procedures well.  Pt then requested Ginger Ale and this was given to Pt.

## 2013-02-26 NOTE — Progress Notes (Signed)
1:1 NURSING  D.  Pt has rested off and on since being changed, feels his medication needs to be increased for pain.  Currently resting in bed with eyes closed, respirations even and unlabored.  No acute distress noted.  A. 1:1 continued as ordered for Pt safety  R.  Pt remains safe, will continue to monitor.

## 2013-02-26 NOTE — ED Notes (Signed)
Security requested to bedside to wand patient and belongings. 

## 2013-02-26 NOTE — ED Notes (Addendum)
Patient brought to room via his personal wheelchair. Patient was found to be sitting on an empty blister pack of 8- 50mg  trazadone. Patient unable to verbalize when he took them. Patient chewing on a cigerette. Patient had cigerettes and lighter in his pocket. Patient wheelchair and belongings removed from bedside.

## 2013-02-26 NOTE — Discharge Planning (Addendum)
[facility] called CSW stating that [facility] would not allow pt to enter facility being that they do not see pt's after 3pm. Driver was told to allow pt to get off there. She decided to bring him back to [facility]. Another driver called and will transport pt to [facility] in [place].    [facility] denied, patient brought back to [facility], Denied admission and [facility] will not drop off and taking patient to [facility] ED. LCSW called Kristen at [facility] and alerted of patient. Baxter Hire very familiar of patient and aware of patient exhausted all resources in community.  Andres Shad, MSW Clinical Lead [phone]

## 2013-02-26 NOTE — ED Notes (Signed)
Not in waiting room

## 2013-02-26 NOTE — Discharge Summary (Signed)
Physician Discharge Summary Note  Patient:  Jeffrey [name] is an 57 y.o., male MRN:  [id number] DOB:  1956-08-25 Patient phone:  [phone] (home)  Patient address:   [address] [place] [address],   Date of Admission:  02/09/2013 Date of Discharge: 02/26/13  Reason for Admission:  Increased depression/suicidal ideations  Discharge Diagnoses: Principal Problem:   Major depressive disorder, single episode, severe, without mention of psychotic behavior Active Problems:   Opiate use  Review of Systems  Constitutional: Negative.   HENT: Negative.   Eyes: Negative.   Respiratory: Negative.   Cardiovascular: Negative.   Gastrointestinal: Negative.   Genitourinary: Negative.   Musculoskeletal: Positive for myalgias, back pain and joint pain.       Mobility per use of wheel chair   Skin: Negative for itching and rash.       Some healing open sores to thigh areas  Neurological: Negative.   Endo/Heme/Allergies: Negative.   Psychiatric/Behavioral: Positive for depression (Stabilized with medication prior to discharge) and substance abuse. Negative for suicidal ideas, hallucinations and memory loss. The patient has insomnia (Stabilized with medication prior to discharge). The patient is not nervous/anxious.    Axis Diagnosis:   AXIS I:  Major depressive disorder, single episode, severe, without mention of psychotic behavior, Opiate use AXIS II:  Deferred AXIS III:   Past Medical History  Diagnosis Date  . DDD (degenerative disc disease)   . Necrotizing fasciitis   . Degenerative disc disease   . Hypertension   . Degenerative disc disease   . Hepatitis C   . Chronic pain   . DDD (degenerative disc disease)   . Parkinson disease   . Mental disorder   . Depression   . DDD (degenerative disc disease)   . DDD (degenerative disc disease), lumbar    AXIS IV:  other psychosocial or environmental problems AXIS V:  61  Level of Care:  OP  Hospital Course:  This  is one of the several ER visit for this patient for chronic back pain and alcohol dependence. Patient is a 57 year old white male that uses a wheel chair. Patient is not orientated to date and time. During the assessment the patient had focused on his pain management and medications. He reports he has not been to his pain clinic for few months because of lack of transportation. Patient reports feelings of depression associated with chronic pain in his lower back and legs and not able to renew and obtain more medications.  Patient reports that he has a plan to kill himself by taking an overdose of drugs. Patient reports that he no longer wants to live due to so much pain in his back and legs and is now on a wheel chair. Patient reports that he has attempted to kill himself in the past. Patient was not able to state the name of the psychiatric hospital that he was admitted. He states if he is discharged he will kill himself by over dosing on Heroin. He states life is worthless with no place to stay, no money and no transportation. He feels hopeless and helpless and at times had teary eyes.  Patient reports a past history of mental illness. Patient reports that he is working with an ACT Team but he does not remember anyone's name on the Team. He was ejected from [employer] for touching a staff and reports he has been living in the woods. He states he uses alcohol to treat pain and  depression but will be willing to start on medications to treat his bipolar disorder. On admission his alcohol level was high at 297. He was admitted from [facility] last night for alcohol detoxification and treatment of his mental illness. He is still endorsing suicide during my encounter with him today. He is unable to contract for safety and is on 1:1 observation. He reports no auditory hallucination today but visual hallucination seeing lights from the corners of his eyes. He reports minimal pain control since the Hospitalist put him on a  pain regimen. He is worried about what will happen after his hospitalization since he was living in the woods. We will continue plan of care at this time with the goal of treating his bipolar d/o and depression and maintaining his safety. We will detox him with Ativan protocol.  While a patient in this hospital, Jeffrey [name] received medication management for his depression, pain issues and a brief librium 25 mg administration on prn basis to combat any withdrawal/anxiety symptoms from alcohol. He did not receive any detoxification treatment protocol for opiates as he was maintained on that medication for his chronic pain episodes. Jeffrey [name] received psychiatric treatment and basic medical treatments as well as assistance with adls as he was noted to be incontinent of bowel and bladder throughout his hospitalization. He was also on 1:1 supervision round the clock for safety and constant threats of suicide. He mobilized on the unit with the aid of a wheel chair as his ability to ambulate is very limited due to lower extremity weakness associated with chronic musculoskeletal problems. He id propel his wheel chair on his own and at times may need assistance.  For his mood symptoms, he was prescribed and received Duloxetine 60 mg daily for depression, Gabapentin 900 mg tid for anxiety/pain management, Depakote 500 mg twicw daily for mood stabilization, and Trazodone 50 mg Q bedtime for sleep. He was also enrolled in group counseling sessions and activities where he was counseled and learned coping skills that should help him maintain stability after discharge. Patient also received medication management and monitoring for his other chronic medical issues/concerns presented. He tolerated his treatment regimen without any significant adverse effects and or reactions reported.  Patient did respond to his treatment regimen gradually on daily basis. This is evidenced by his daily reports of improved mood, reduction of  symptoms and presentation of good affect/eye contact. However, he did on daily basis reports passive intrusive suicidal thoughts but denies any plans and or intent to harm self and or others. Patient attended treatment team meeting this am and met with the treatment team members. His reason for admission, present symptoms, treatment plans and response to treatment plans discussed. Patient endorsed that he is doing well and stable for discharge. It was agreed upon that he will continue psychiatric care on outpatient basis at the [facility] clinic here in [place], [place]. He is instructed and informed that this is a walk-in appointment between the hours of 08:00 - 09:00 am, Monday through Friday. The address, date and time and contact information for for this clinic provided for patient in writing.  Upon discharge, patient adamantly denies suicidal, homicidal ideations, auditory, visual hallucinations and or delusional thinking. He received from [facility] a 4 days worth supply samples of his [facility] discharge medications. He left [facility] with all personal belongings via  in no apparent via an arranged transportation.  Consults:  psychiatry  Significant Diagnostic Studies:  labs: CBC with diff, CMP, UDS, Toxicology tests, U/A  Discharge Vitals:   Blood pressure 141/90, pulse 66, temperature 97.9 F (36.6 C), temperature source Oral, resp. rate 20, height 6\' 4"  (1.93 m), weight 99.791 kg (220 lb), SpO2 98.00%. Body mass index is 26.79 kg/(m^2). Lab Results:   No results found for this or any previous visit (from the past 72 hour(s)).  Physical Findings: AIMS: Facial and Oral Movements Muscles of Facial Expression: None, normal Lips and Perioral Area: None, normal Jaw: None, normal Tongue: None, normal,Extremity Movements Upper (arms, wrists, hands, fingers): None, normal Lower (legs, knees, ankles, toes): None, normal, Trunk Movements Neck, shoulders, hips: None, normal, Overall Severity Severity of abnormal  movements (highest score from questions above): None, normal Incapacitation due to abnormal movements: None, normal Patient's awareness of abnormal movements (rate only patient's report): No Awareness, Dental Status Current problems with teeth and/or dentures?: No Does patient usually wear dentures?: No  CIWA:  CIWA-Ar Total: 0 COWS:  COWS Total Score: 5  Psychiatric Specialty Exam: See Psychiatric Specialty Exam and Suicide Risk Assessment completed by Attending Physician prior to discharge.  Discharge destination:  Home  Is patient on multiple antipsychotic therapies at discharge:  No   Has Patient had three or more failed trials of antipsychotic monotherapy by history:  No  Recommended Plan for Multiple Antipsychotic Therapies: NA     Medication List    STOP taking these medications       gabapentin 600 MG tablet  Commonly known as:  NEURONTIN      TAKE these medications     Indication   divalproex 500 MG DR tablet  Commonly known as:  DEPAKOTE  Take 1 tablet (500 mg total) by mouth every 12 (twelve) hours. For mood stabilization   Indication:  Manic-Depression     DULoxetine 60 MG capsule  Commonly known as:  CYMBALTA  Take 1 capsule (60 mg total) by mouth daily. For depression/pain control   Indication:  Major Depressive Disorder, Musculoskeletal Pain     gabapentin 300 MG capsule  Commonly known as:  NEURONTIN  Take 3 capsules (900 mg total) by mouth 3 (three) times daily. For anxiety/pain control   Indication:  Agitation, Nerve Pain After Herpes Zoster or Shingles, Anxiety     metoprolol succinate 25 MG 24 hr tablet  Commonly known as:  TOPROL-XL  Take 1 tablet (25 mg total) by mouth daily. For high blood pressure control   Indication:  High Blood Pressure     oxyCODONE-acetaminophen 5-325 MG per tablet  Commonly known as:  PERCOCET/ROXICET  Take 1 tablet by mouth every 4 (four) hours as needed for pain.   Indication:  Pain     potassium chloride SA 20  MEQ tablet  Commonly known as:  K-DUR,KLOR-CON  Take 1 tablet (20 mEq total) by mouth 2 (two) times daily. For low potassium   Indication:  Low Amount of Potassium in the Blood     thiamine 100 MG tablet  Take 1 tablet (100 mg total) by mouth daily. For low thiamine   Indication:  Deficiency in Thiamine or Vitamin B1     tiZANidine 4 MG tablet  Commonly known as:  ZANAFLEX  Take 1 tablet (4 mg total) by mouth every 8 (eight) hours as needed. For muscle pain   Indication:  Muscle Spasticity     traMADol 50 MG tablet  Commonly known as:  ULTRAM  Take 1 tablet (50 mg total) by mouth 2 (two) times daily as needed for pain.   Indication:  Moderate  to Moderately Severe Pain     traZODone 50 MG tablet  Commonly known as:  DESYREL  Take 1 tablet (50 mg total) by mouth at bedtime as needed and may repeat dose one time if needed for sleep.   Indication:  Trouble Sleeping       Follow-up Information   Follow up with [facility]. (Walk-in Monday through Friday between 8am-9am for hosptial followup/medication managment. Transitional Care Team at [facility] will also be working with you to help you with arragning transport for appts, housing needs, and with making resource connections.)    Contact information:   [address], [place] [address] phone: [phone] fax: [phone]      Follow up with [facility]. (Walk in for hospital followup/medication managment (medical) )    Contact information:   [address] [place] [address] phone: [phone]     Follow-up recommendations:  Activity:  As tolerated Diet: As recommended by your primary care doctor. Keep all scheduled follow-up appointments as recommended.  Comments:  Take all your medications as prescribed by your mental healthcare provider. Report any adverse effects and or reactions from your medicines to your outpatient provider promptly. Patient is instructed and cautioned to not engage in alcohol and or illegal  drug use while on prescription medicines. In the event of worsening symptoms, patient is instructed to call the crisis hotline, 911 and or go to the nearest ED for appropriate evaluation and treatment of symptoms. Follow-up with your primary care provider for your other medical issues, concerns and or health care needs.   Total Discharge Time:  Greater than 30 minutes.  Signed: Sanjuana Kava, PMHNP-BC 02/27/2013, 4:16 PM  Patient is personally evaluated and care plan developed and case discussed with physician extender. Reviewed the information documented and agree with the discharge treatment plan.  Johny Pitstick,JANARDHAHA R. 03/01/2013 6:42 PM

## 2013-02-26 NOTE — BHH Suicide Risk Assessment (Signed)
Suicide Risk Assessment  Discharge Assessment     Demographic Factors:  Male, Adolescent or young adult, Caucasian, Low socioeconomic status, Living alone and Unemployed  Mental Status Per Nursing Assessment::   On Admission:  Suicidal ideation indicated by patient;Intention to act on suicide plan  Current Mental Status by Physician: Patient is seen in his wheelchair, casually dressed fair groomed fine mood but constricted affect. Patient has no suicidal thoughts but made statement no comments. Staff RN reported that he has been homeless and  and a has recurrent and repeated admissions to the psychiatric floor. Patient does not have suicidal behavior or gestures. Patient has no homicidal ideation intentions or plans.   Loss Factors: Decrease in vocational status, Decline in physical health and Financial problems/change in socioeconomic status  Historical Factors: Prior suicide attempts, Family history of suicide, Family history of mental illness or substance abuse and Impulsivity  Risk Reduction Factors:   Religious beliefs about death and Living with another person, especially a relative  Continued Clinical Symptoms:  Bipolar Disorder:   Mixed State Alcohol/Substance Abuse/Dependencies Unstable or Poor Therapeutic Relationship Previous Psychiatric Diagnoses and Treatments Medical Diagnoses and Treatments/Surgeries  Cognitive Features That Contribute To Risk:  Loss of executive function Polarized thinking    Suicide Risk:  Mild:  Suicidal ideation of limited frequency, intensity, duration, and specificity.  There are no identifiable plans, no associated intent, mild dysphoria and related symptoms, good self-control (both objective and subjective assessment), few other risk factors, and identifiable protective factors, including available and accessible social support.  Discharge Diagnoses:   AXIS I:  Bipolar, Depressed, Substance Abuse and Substance Induced Mood Disorder AXIS  II:  Cluster B Traits AXIS III:   Past Medical History  Diagnosis Date  . DDD (degenerative disc disease)   . Necrotizing fasciitis   . Degenerative disc disease   . Hypertension   . Degenerative disc disease   . Hepatitis C   . Chronic pain   . DDD (degenerative disc disease)   . Parkinson disease   . Mental disorder   . Depression   . DDD (degenerative disc disease)   . DDD (degenerative disc disease), lumbar    AXIS IV:  economic problems, educational problems, housing problems, occupational problems, other psychosocial or environmental problems and problems related to social environment AXIS V:  51-60 moderate symptoms  Plan Of Care/Follow-up recommendations:  Activity:  As tolerated Diet:  Regular  Is patient on multiple antipsychotic therapies at discharge:  No   Has Patient had three or more failed trials of antipsychotic monotherapy by history:  No  Recommended Plan for Multiple Antipsychotic Therapies: Not applicable  Jeffrey [name],Jeffrey R. 02/26/2013, 12:52 PM

## 2013-02-26 NOTE — Progress Notes (Signed)
Pt was discharged today and planned to go to the [facility] via [facility]. He denied any S/I H/I or A/V hallucinations.  He was given f/u appointment, rx, sample medications, hotline info booklet, and information for the [facility] and about [facility] in [place] provided by the case manager.  He voiced understanding to all instructions provided.  He declined the need for smoking cessation materials.  Pt refused to sign his belongings sheet.

## 2013-02-26 NOTE — BHH Group Notes (Signed)
[employer] LCSW Group Therapy  02/26/2013 1:56 PM  Type of Therapy:  Group Therapy  Participation Level:  Did Not Attend  Smart, Herbert Seta 02/26/2013, 1:56 PM

## 2013-02-27 ENCOUNTER — Encounter (HOSPITAL_COMMUNITY): Payer: Self-pay | Admitting: Emergency Medicine

## 2013-02-27 ENCOUNTER — Other Ambulatory Visit: Payer: Self-pay

## 2013-02-27 ENCOUNTER — Emergency Department (HOSPITAL_COMMUNITY)
Admission: EM | Admit: 2013-02-27 | Discharge: 2013-02-27 | Disposition: A | Discharge status: Home / Self Care | Payer: Medicaid Other | Source: Home / Self Care | Attending: Emergency Medicine | Admitting: Emergency Medicine

## 2013-02-27 DIAGNOSIS — G20A1 Parkinson's disease without dyskinesia, without mention of fluctuations: Secondary | ICD-10-CM | POA: Insufficient documentation

## 2013-02-27 DIAGNOSIS — F17200 Nicotine dependence, unspecified, uncomplicated: Secondary | ICD-10-CM | POA: Insufficient documentation

## 2013-02-27 DIAGNOSIS — Z043 Encounter for examination and observation following other accident: Secondary | ICD-10-CM | POA: Insufficient documentation

## 2013-02-27 DIAGNOSIS — Z8619 Personal history of other infectious and parasitic diseases: Secondary | ICD-10-CM | POA: Insufficient documentation

## 2013-02-27 DIAGNOSIS — G20 Parkinson's disease: Secondary | ICD-10-CM | POA: Insufficient documentation

## 2013-02-27 DIAGNOSIS — I10 Essential (primary) hypertension: Secondary | ICD-10-CM | POA: Insufficient documentation

## 2013-02-27 DIAGNOSIS — Z993 Dependence on wheelchair: Secondary | ICD-10-CM | POA: Insufficient documentation

## 2013-02-27 DIAGNOSIS — F1910 Other psychoactive substance abuse, uncomplicated: Secondary | ICD-10-CM

## 2013-02-27 DIAGNOSIS — G8929 Other chronic pain: Secondary | ICD-10-CM | POA: Insufficient documentation

## 2013-02-27 DIAGNOSIS — F329 Major depressive disorder, single episode, unspecified: Secondary | ICD-10-CM | POA: Insufficient documentation

## 2013-02-27 DIAGNOSIS — F3289 Other specified depressive episodes: Secondary | ICD-10-CM | POA: Insufficient documentation

## 2013-02-27 DIAGNOSIS — F1010 Alcohol abuse, uncomplicated: Secondary | ICD-10-CM | POA: Insufficient documentation

## 2013-02-27 DIAGNOSIS — Z79899 Other long term (current) drug therapy: Secondary | ICD-10-CM | POA: Insufficient documentation

## 2013-02-27 DIAGNOSIS — W19XXXA Unspecified fall, initial encounter: Secondary | ICD-10-CM

## 2013-02-27 DIAGNOSIS — Z8739 Personal history of other diseases of the musculoskeletal system and connective tissue: Secondary | ICD-10-CM | POA: Insufficient documentation

## 2013-02-27 LAB — CBC
Hemoglobin: 13.8 g/dL (ref 13.0–17.0)
MCH: 28.2 pg (ref 26.0–34.0)
MCV: 86.5 fL (ref 78.0–100.0)
Platelets: 275 10*3/uL (ref 150–400)
RBC: 4.89 MIL/uL (ref 4.22–5.81)
WBC: 7.7 10*3/uL (ref 4.0–10.5)

## 2013-02-27 LAB — POCT I-STAT, CHEM 8
BUN: 17 mg/dL (ref 6–23)
Calcium, Ion: 1.11 mmol/L — ABNORMAL LOW (ref 1.12–1.23)
Chloride: 107 mEq/L (ref 96–112)
Creatinine, Ser: 0.9 mg/dL (ref 0.50–1.35)
TCO2: 22 mmol/L (ref 0–100)

## 2013-02-27 MED ORDER — CEPHALEXIN 500 MG PO CAPS: 500.0000 mg | ORAL_CAPSULE | Freq: Two times a day (BID) | ORAL | Status: DC

## 2013-02-27 MED ORDER — CEPHALEXIN 500 MG PO CAPS
500.0000 mg | ORAL_CAPSULE | Freq: Two times a day (BID) | ORAL | Status: DC
  Administered 2013-02-27: 500 mg via ORAL
  Filled 2013-02-27: qty 1

## 2013-02-27 NOTE — ED Notes (Signed)
Pt did not bring wheelchair with him with this ED admission-- pt is wheelchair-bound.

## 2013-02-27 NOTE — ED Notes (Signed)
AVW:[id number]<[id number]> Expected date:<BR> Expected time:<BR> Means of arrival:<BR> Comments:<BR> 57 y/o M, chronic back pain, SI, wheelchair bound

## 2013-02-27 NOTE — ED Provider Notes (Signed)
History    CSN: [id number] Arrival date & time 02/26/13  1745  First MD Initiated Contact with Patient 02/26/13 2206     Chief Complaint  Patient presents with  . Urinary Frequency  . ?overdose    (Consider location/radiation/quality/duration/timing/severity/associated sxs/prior Treatment) HPI Comments: Pt being transferred to [facility] ED with cc of dysuria. Pt allegedly took 8 trazadones prior to the ED arrival. Pt is arousable, and states that he has been having dysuria and polyuria. No abd pain, no back pain. No n/v/f/c. No back pain.  Patient is a 57 y.o. male presenting with frequency. The history is provided by the patient and medical records.  Urinary Frequency Pertinent negatives include no chest pain, no abdominal pain and no shortness of breath.   Past Medical History  Diagnosis Date  . DDD (degenerative disc disease)   . Necrotizing fasciitis   . Degenerative disc disease   . Hypertension   . Degenerative disc disease   . Hepatitis C   . Chronic pain   . DDD (degenerative disc disease)   . Parkinson disease   . Mental disorder   . Depression   . DDD (degenerative disc disease)   . DDD (degenerative disc disease), lumbar    Past Surgical History  Procedure Laterality Date  . Hip surgery    . Appendectomy    . Ankle surgery    . Incision and drainage of wound N/A 11/26/2012    Procedure: IRRIGATION AND DEBRIDEMENT WOUND;  Surgeon: Emelia Loron, MD;  Location: [facility] OR;  Service: General;  Laterality: N/A;  . Wound exploration N/A 11/26/2012    Procedure: WOUND EXPLORATION;  Surgeon: Emelia Loron, MD;  Location: [facility] OR;  Service: General;  Laterality: N/A;   No family history on file. History  Substance Use Topics  . Smoking status: Current Every Day Smoker -- 1.00 packs/day    Types: Cigarettes    Last Attempt to Quit: 11/27/2012  . Smokeless tobacco: Current User    Types: Chew  . Alcohol Use: 1.5 oz/week     Comment: anything he can get his hands on     Review of Systems  Constitutional: Negative for activity change and appetite change.  Respiratory: Negative for cough and shortness of breath.   Cardiovascular: Negative for chest pain.  Gastrointestinal: Negative for abdominal pain.  Genitourinary: Positive for dysuria and frequency.    Allergies  Review of patient's allergies indicates no known allergies.  Home Medications   Current Outpatient Rx  Name  Route  Sig  Dispense  Refill  . divalproex (DEPAKOTE) 500 MG DR tablet   Oral   Take 1 tablet (500 mg total) by mouth every 12 (twelve) hours. For mood stabilization   60 tablet   0   . DULoxetine (CYMBALTA) 60 MG capsule   Oral   Take 1 capsule (60 mg total) by mouth daily. For depression/pain control   30 capsule   0   . gabapentin (NEURONTIN) 300 MG capsule   Oral   Take 3 capsules (900 mg total) by mouth 3 (three) times daily. For anxiety/pain control   270 capsule   0   . metoprolol succinate (TOPROL-XL) 25 MG 24 hr tablet   Oral   Take 1 tablet (25 mg total) by mouth daily. For high blood pressure control         . potassium chloride SA (K-DUR,KLOR-CON) 20 MEQ tablet   Oral   Take 1 tablet (20 mEq total) by mouth 2 (  two) times daily. For low potassium   60 tablet   0   . thiamine 100 MG tablet   Oral   Take 1 tablet (100 mg total) by mouth daily. For low thiamine   30 tablet   0   . tiZANidine (ZANAFLEX) 4 MG tablet   Oral   Take 1 tablet (4 mg total) by mouth every 8 (eight) hours as needed. For muscle pain   90 tablet   0   . traMADol (ULTRAM) 50 MG tablet   Oral   Take 1 tablet (50 mg total) by mouth 2 (two) times daily as needed for pain.   30 tablet      . oxyCODONE-acetaminophen (PERCOCET/ROXICET) 5-325 MG per tablet   Oral   Take 1 tablet by mouth every 4 (four) hours as needed for pain.   30 tablet   0   . traZODone (DESYREL) 50 MG tablet   Oral   Take 1 tablet (50 mg total) by mouth at bedtime as needed and may repeat dose  one time if needed for sleep.   60 tablet   0    BP 129/84  Pulse 76  Temp(Src) 97.9 F (36.6 C)  Resp 18  SpO2 98% Physical Exam  Nursing note and vitals reviewed. Constitutional: He is oriented to person, place, and time. He appears well-developed.  HENT:  Head: Normocephalic and atraumatic.  Eyes: Conjunctivae and EOM are normal. Pupils are equal, round, and reactive to light.  Neck: Normal range of motion. Neck supple.  Cardiovascular: Normal rate and regular rhythm.   Pulmonary/Chest: Effort normal and breath sounds normal.  Abdominal: Soft. Bowel sounds are normal. He exhibits no distension. There is no tenderness. There is no rebound and no guarding.  Neurological: He is alert and oriented to person, place, and time.  Skin: Skin is warm.    ED Course  Procedures (including critical care time) Labs Reviewed  URINALYSIS, ROUTINE W REFLEX MICROSCOPIC - Abnormal; Notable for the following:    APPearance CLOUDY (*)    Hgb urine dipstick SMALL (*)    Leukocytes, UA MODERATE (*)    All other components within normal limits  SALICYLATE LEVEL - Abnormal; Notable for the following:    Salicylate Lvl <2.0 (*)    All other components within normal limits  COMPREHENSIVE METABOLIC PANEL - Abnormal; Notable for the following:    Sodium 134 (*)    Albumin 3.4 (*)    Total Bilirubin 0.1 (*)    All other components within normal limits  ETHANOL - Abnormal; Notable for the following:    Alcohol, Ethyl (B) 26 (*)    All other components within normal limits  URINE RAPID DRUG SCREEN (HOSP PERFORMED) - Abnormal; Notable for the following:    Benzodiazepines POSITIVE (*)    All other components within normal limits  URINE MICROSCOPIC-ADD ON - Abnormal; Notable for the following:    Bacteria, UA FEW (*)    All other components within normal limits  URINE CULTURE  ACETAMINOPHEN LEVEL  CBC WITH DIFFERENTIAL   No results found. No diagnosis found.  MDM  Pt comes in with dysuria,  polyuria.  UA shows infection. Will treat as a UTI, and culture the urine. Allegedly took trazadones prior to ED arrival. Slightly sluggish, but arousable.  Will get tox labs. Poison control called about obs time. EKG pending at this time.  12:34 AM Pt was signed out to Dr. Norlene Campbell. Discharge home vs. Behavioral health when  proper.    Derwood Kaplan, MD 02/28/13 [phone]

## 2013-02-27 NOTE — ED Notes (Signed)
Spoke with Onalee Hua at [employer], recommendation to monitor patient for 6 hours. Will call to follow up prior to 0700

## 2013-02-27 NOTE — ED Notes (Signed)
Pt here d/c this am back for s/p fall from w/c in grass area c/o generalized pain denies loc

## 2013-02-27 NOTE — ED Notes (Signed)
Patient had me count his money in front of him, there was $9 in bills(1x$5,4x$1) $1.75 in quarters, 30 cents in dimes, 5 cents in nickles, and 8 cents in pennies. For a total of $2.18. It is located in the green socks in the belonging bag with the shoes.

## 2013-02-27 NOTE — ED Provider Notes (Signed)
Care assumed from Dr. Rhunette Croft.  Pt with UTI, also suspected to have taken overdose of trazodone.  Pt workup unremarkable other than UTI.  Pt now rouses easily to voice, denies any complaint.  No side effects from overdose noted.  Results for orders placed during the hospital encounter of 02/26/13  URINALYSIS, ROUTINE W REFLEX MICROSCOPIC      Result Value Range   Color, Urine YELLOW  YELLOW   APPearance CLOUDY (*) CLEAR   Specific Gravity, Urine 1.013  1.005 - 1.030   pH 6.0  5.0 - 8.0   Glucose, UA NEGATIVE  NEGATIVE mg/dL   Hgb urine dipstick SMALL (*) NEGATIVE   Bilirubin Urine NEGATIVE  NEGATIVE   Ketones, ur NEGATIVE  NEGATIVE mg/dL   Protein, ur NEGATIVE  NEGATIVE mg/dL   Urobilinogen, UA 0.2  0.0 - 1.0 mg/dL   Nitrite NEGATIVE  NEGATIVE   Leukocytes, UA MODERATE (*) NEGATIVE  SALICYLATE LEVEL      Result Value Range   Salicylate Lvl <2.0 (*) 2.8 - 20.0 mg/dL  ACETAMINOPHEN LEVEL      Result Value Range   Acetaminophen (Tylenol), Serum <15.0  10 - 30 ug/mL  COMPREHENSIVE METABOLIC PANEL      Result Value Range   Sodium 134 (*) 135 - 145 mEq/L   Potassium 4.9  3.5 - 5.1 mEq/L   Chloride 98  96 - 112 mEq/L   CO2 24  19 - 32 mEq/L   Glucose, Bld 86  70 - 99 mg/dL   BUN 18  6 - 23 mg/dL   Creatinine, Ser 1.61  0.50 - 1.35 mg/dL   Calcium 9.5  8.4 - 09.6 mg/dL   Total Protein 7.7  6.0 - 8.3 g/dL   Albumin 3.4 (*) 3.5 - 5.2 g/dL   AST 20  0 - 37 U/L   ALT 11  0 - 53 U/L   Alkaline Phosphatase 77  39 - 117 U/L   Total Bilirubin 0.1 (*) 0.3 - 1.2 mg/dL   GFR calc non Af Amer >90  >90 mL/min   GFR calc Af Amer >90  >90 mL/min  CBC WITH DIFFERENTIAL      Result Value Range   WBC 8.2  4.0 - 10.5 K/uL   RBC 4.81  4.22 - 5.81 MIL/uL   Hemoglobin 13.3  13.0 - 17.0 g/dL   HCT 04.5  40.9 - 81.1 %   MCV 86.9  78.0 - 100.0 fL   MCH 27.7  26.0 - 34.0 pg   MCHC 31.8  30.0 - 36.0 g/dL   RDW 91.4  78.2 - 95.6 %   Platelets 264  150 - 400 K/uL   Neutrophils Relative % 69  43 - 77  %   Neutro Abs 5.6  1.7 - 7.7 K/uL   Lymphocytes Relative 20  12 - 46 %   Lymphs Abs 1.6  0.7 - 4.0 K/uL   Monocytes Relative 9  3 - 12 %   Monocytes Absolute 0.8  0.1 - 1.0 K/uL   Eosinophils Relative 1  0 - 5 %   Eosinophils Absolute 0.1  0.0 - 0.7 K/uL   Basophils Relative 1  0 - 1 %   Basophils Absolute 0.1  0.0 - 0.1 K/uL  ETHANOL      Result Value Range   Alcohol, Ethyl (B) 26 (*) 0 - 11 mg/dL  URINE RAPID DRUG SCREEN (HOSP PERFORMED)      Result Value Range   Opiates  NONE DETECTED  NONE DETECTED   Cocaine NONE DETECTED  NONE DETECTED   Benzodiazepines POSITIVE (*) NONE DETECTED   Amphetamines NONE DETECTED  NONE DETECTED   Tetrahydrocannabinol NONE DETECTED  NONE DETECTED   Barbiturates NONE DETECTED  NONE DETECTED  URINE MICROSCOPIC-ADD ON      Result Value Range   WBC, UA 11-20  <3 WBC/hpf   RBC / HPF 3-6  <3 RBC/hpf   Bacteria, UA FEW (*) RARE   Dg Chest 2 View  01/29/2013   *RADIOLOGY REPORT*  Clinical Data: Status post fall; left anterior rib pain.  CHEST - 2 VIEW  Comparison: Chest radiograph performed 11/09/2012, and CTA of the chest performed 06/01/2011  Findings: The lungs are well-aerated and clear.  There is no evidence of focal opacification, pleural effusion or pneumothorax.  The heart is normal in size; the mediastinal contour is within normal limits.  No acute osseous abnormalities are seen.  IMPRESSION: No acute cardiopulmonary process seen; no displaced rib fractures identified.   Original Report Authenticated By: Tonia Ghent, M.D.      Olivia Mackie, MD 02/27/13 [phone]

## 2013-02-27 NOTE — ED Notes (Signed)
Pt's friend: tel# [phone]

## 2013-02-27 NOTE — ED Provider Notes (Signed)
History    CSN: [id number] Arrival date & time 02/27/13  1716  First MD Initiated Contact with Patient 02/27/13 1803     Chief Complaint  Patient presents with  . Fall   (Consider location/radiation/quality/duration/timing/severity/associated sxs/prior Treatment) HPI Jeffrey [name] is a 57 y.o. male who presents to ED with complaint of fall. Pt states he was drinking, and reports falling onto grassy area. States no head injury no LOC. Pt brought in by EMS. Pt with hx of alcohol abuse, just discharged  From [facility] yesterday. Pt also was seen this morning diagnosed with UTI. Pt appears somnolent. States just feels "sleepy."  Past Medical History  Diagnosis Date  . DDD (degenerative disc disease)   . Necrotizing fasciitis   . Degenerative disc disease   . Hypertension   . Degenerative disc disease   . Hepatitis C   . Chronic pain   . DDD (degenerative disc disease)   . Parkinson disease   . Mental disorder   . Depression   . DDD (degenerative disc disease)   . DDD (degenerative disc disease), lumbar    Past Surgical History  Procedure Laterality Date  . Hip surgery    . Appendectomy    . Ankle surgery    . Incision and drainage of wound N/A 11/26/2012    Procedure: IRRIGATION AND DEBRIDEMENT WOUND;  Surgeon: Emelia Loron, MD;  Location: [facility] OR;  Service: General;  Laterality: N/A;  . Wound exploration N/A 11/26/2012    Procedure: WOUND EXPLORATION;  Surgeon: Emelia Loron, MD;  Location: [facility] OR;  Service: General;  Laterality: N/A;   History reviewed. No pertinent family history. History  Substance Use Topics  . Smoking status: Current Every Day Smoker -- 1.00 packs/day    Types: Cigarettes    Last Attempt to Quit: 11/27/2012  . Smokeless tobacco: Current User    Types: Chew  . Alcohol Use: 1.5 oz/week     Comment: anything he can get his hands on    Review of Systems  Unable to perform ROS: Mental status change  Musculoskeletal: Positive for myalgias and  arthralgias.    Allergies  Review of patient's allergies indicates no known allergies.  Home Medications   Current Outpatient Rx  Name  Route  Sig  Dispense  Refill  . cephALEXin (KEFLEX) 500 MG capsule   Oral   Take 1 capsule (500 mg total) by mouth every 12 (twelve) hours.   14 capsule   0   . divalproex (DEPAKOTE) 500 MG DR tablet   Oral   Take 1 tablet (500 mg total) by mouth every 12 (twelve) hours. For mood stabilization   60 tablet   0   . DULoxetine (CYMBALTA) 60 MG capsule   Oral   Take 1 capsule (60 mg total) by mouth daily. For depression/pain control   30 capsule   0   . gabapentin (NEURONTIN) 300 MG capsule   Oral   Take 3 capsules (900 mg total) by mouth 3 (three) times daily. For anxiety/pain control   270 capsule   0   . metoprolol succinate (TOPROL-XL) 25 MG 24 hr tablet   Oral   Take 1 tablet (25 mg total) by mouth daily. For high blood pressure control         . oxyCODONE-acetaminophen (PERCOCET/ROXICET) 5-325 MG per tablet   Oral   Take 1 tablet by mouth every 4 (four) hours as needed for pain.   30 tablet   0   .  potassium chloride SA (K-DUR,KLOR-CON) 20 MEQ tablet   Oral   Take 1 tablet (20 mEq total) by mouth 2 (two) times daily. For low potassium   60 tablet   0   . thiamine 100 MG tablet   Oral   Take 1 tablet (100 mg total) by mouth daily. For low thiamine   30 tablet   0   . tiZANidine (ZANAFLEX) 4 MG tablet   Oral   Take 1 tablet (4 mg total) by mouth every 8 (eight) hours as needed. For muscle pain   90 tablet   0   . traMADol (ULTRAM) 50 MG tablet   Oral   Take 1 tablet (50 mg total) by mouth 2 (two) times daily as needed for pain.   30 tablet      . traZODone (DESYREL) 50 MG tablet   Oral   Take 1 tablet (50 mg total) by mouth at bedtime as needed and may repeat dose one time if needed for sleep.   60 tablet   0    BP 96/51  Pulse 85  Temp(Src) 98.2 F (36.8 C) (Oral)  Resp 18  SpO2 94% Physical Exam   Nursing note and vitals reviewed. Constitutional: He is oriented to person, place, and time. He appears well-developed and well-nourished.  Solmnolent  HENT:  Head: Normocephalic and atraumatic.  Right Ear: External ear normal.  Left Ear: External ear normal.  Nose: Nose normal.  Mouth/Throat: Oropharynx is clear and moist.  Eyes: Conjunctivae are normal.  Neck: Normal range of motion. Neck supple.  Cardiovascular: Normal rate, regular rhythm and normal heart sounds.   Pulmonary/Chest: Effort normal and breath sounds normal. No respiratory distress. He has no wheezes. He has no rales.  Abdominal: Soft. Bowel sounds are normal. He exhibits no distension. There is no tenderness. There is no rebound.  Musculoskeletal: He exhibits no edema.  Neurological: He is alert and oriented to person, place, and time. No cranial nerve deficit. Coordination normal.  Pt is somnolent, stays up for about 5sec at a time. arousable to loud voice and stimuli.   Skin: Skin is warm and dry.    ED Course  Procedures (including critical care time) PT seen and examined. Will monitor. Appears sedated vs intoxicated. Will get blood tests, alcohol level.   Results for orders placed during the hospital encounter of 02/27/13  ETHANOL      Result Value Range   Alcohol, Ethyl (B) 12 (*) 0 - 11 mg/dL  CBC      Result Value Range   WBC 7.7  4.0 - 10.5 K/uL   RBC 4.89  4.22 - 5.81 MIL/uL   Hemoglobin 13.8  13.0 - 17.0 g/dL   HCT 29.5  62.1 - 30.8 %   MCV 86.5  78.0 - 100.0 fL   MCH 28.2  26.0 - 34.0 pg   MCHC 32.6  30.0 - 36.0 g/dL   RDW 65.7  84.6 - 96.2 %   Platelets 275  150 - 400 K/uL  POCT I-STAT, CHEM 8      Result Value Range   Sodium 143  135 - 145 mEq/L   Potassium 3.9  3.5 - 5.1 mEq/L   Chloride 107  96 - 112 mEq/L   BUN 17  6 - 23 mg/dL   Creatinine, Ser 9.52  0.50 - 1.35 mg/dL   Glucose, Bld 93  70 - 99 mg/dL   Calcium, Ion 8.41 (*) 1.12 - 1.23 mmol/L  TCO2 22  0 - 100 mmol/L   Hemoglobin  15.0  13.0 - 17.0 g/dL   HCT 78.2  95.6 - 21.3 %   Dg Chest 2 View  01/29/2013   *RADIOLOGY REPORT*  Clinical Data: Status post fall; left anterior rib pain.  CHEST - 2 VIEW  Comparison: Chest radiograph performed 11/09/2012, and CTA of the chest performed 06/01/2011  Findings: The lungs are well-aerated and clear.  There is no evidence of focal opacification, pleural effusion or pneumothorax.  The heart is normal in size; the mediastinal contour is within normal limits.  No acute osseous abnormalities are seen.  IMPRESSION: No acute cardiopulmonary process seen; no displaced rib fractures identified.   Original Report Authenticated By: Tonia Ghent, M.D.     8:01 PM Pt awake, sitting on side of the bed. No distress. Asking to be discharged home. Pt is ambulatory. No distress. Doubt head injury, no signs of trauma. Plan to d/c home.   9:00 PM Pt reports he has no where to go. i reviewed his chart. Pt was last seen by social worker 2 days ago. Per their notes, pt "exausted all resources in the community."  1. Fall, initial encounter   2. Substance abuse     MDM  Pt with alcohol abuse and psychiatric hx. Brought in here after being found laying in the grass. Pt stated he has no injuries, just wanted to sleep. Labs and alcohol unremarkable. Pt is wheel chair bound. Pt is more awake and oriented, no distress. States his wheel chair is at a friends house. We called his friend, who stated, his wheel chair is in the garage, and they are not responsible and not willing to help pt with shelter. Will give shelter names.   Pt given shelter resources. Will d/c home  Filed Vitals:   02/27/13 1716 02/27/13 1735 02/27/13 2100 02/27/13 2120  BP:  96/51 132/87 133/88  Pulse:  85 82 79  Temp:  98.2 F (36.8 C) 98.1 F (36.7 C) 98 F (36.7 C)  TempSrc:  Oral Oral Oral  Resp:  18 18 18   SpO2: 98% 94% 93% 95%     Lottie Mussel, PA-C 02/27/13 2130

## 2013-02-28 ENCOUNTER — Emergency Department (HOSPITAL_COMMUNITY)
Admission: EM | Admit: 2013-02-28 | Discharge: 2013-02-28 | Disposition: A | Discharge status: Home / Self Care | Payer: Medicaid Other | Attending: Emergency Medicine | Admitting: Emergency Medicine

## 2013-02-28 ENCOUNTER — Encounter (HOSPITAL_COMMUNITY): Payer: Self-pay | Admitting: Emergency Medicine

## 2013-02-28 DIAGNOSIS — M5137 Other intervertebral disc degeneration, lumbosacral region: Secondary | ICD-10-CM | POA: Insufficient documentation

## 2013-02-28 DIAGNOSIS — F329 Major depressive disorder, single episode, unspecified: Secondary | ICD-10-CM | POA: Insufficient documentation

## 2013-02-28 DIAGNOSIS — Z5900 Homelessness unspecified: Secondary | ICD-10-CM | POA: Insufficient documentation

## 2013-02-28 DIAGNOSIS — F3289 Other specified depressive episodes: Secondary | ICD-10-CM | POA: Insufficient documentation

## 2013-02-28 DIAGNOSIS — Z8669 Personal history of other diseases of the nervous system and sense organs: Secondary | ICD-10-CM | POA: Insufficient documentation

## 2013-02-28 DIAGNOSIS — Z8619 Personal history of other infectious and parasitic diseases: Secondary | ICD-10-CM | POA: Insufficient documentation

## 2013-02-28 DIAGNOSIS — Z8739 Personal history of other diseases of the musculoskeletal system and connective tissue: Secondary | ICD-10-CM | POA: Insufficient documentation

## 2013-02-28 DIAGNOSIS — G8929 Other chronic pain: Secondary | ICD-10-CM | POA: Insufficient documentation

## 2013-02-28 DIAGNOSIS — I10 Essential (primary) hypertension: Secondary | ICD-10-CM | POA: Insufficient documentation

## 2013-02-28 DIAGNOSIS — M51379 Other intervertebral disc degeneration, lumbosacral region without mention of lumbar back pain or lower extremity pain: Secondary | ICD-10-CM | POA: Insufficient documentation

## 2013-02-28 DIAGNOSIS — F17200 Nicotine dependence, unspecified, uncomplicated: Secondary | ICD-10-CM | POA: Insufficient documentation

## 2013-02-28 DIAGNOSIS — F1010 Alcohol abuse, uncomplicated: Secondary | ICD-10-CM | POA: Insufficient documentation

## 2013-02-28 DIAGNOSIS — Z79899 Other long term (current) drug therapy: Secondary | ICD-10-CM | POA: Insufficient documentation

## 2013-02-28 LAB — CBC
HCT: 42.8 % (ref 39.0–52.0)
MCV: 87.3 fL (ref 78.0–100.0)
RBC: 4.9 MIL/uL (ref 4.22–5.81)
RDW: 15.1 % (ref 11.5–15.5)
WBC: 8.9 10*3/uL (ref 4.0–10.5)

## 2013-02-28 LAB — ETHANOL: Alcohol, Ethyl (B): 121 mg/dL — ABNORMAL HIGH (ref 0–11)

## 2013-02-28 LAB — COMPREHENSIVE METABOLIC PANEL
AST: 21 U/L (ref 0–37)
Albumin: 3.5 g/dL (ref 3.5–5.2)
Alkaline Phosphatase: 88 U/L (ref 39–117)
BUN: 18 mg/dL (ref 6–23)
CO2: 20 mEq/L (ref 19–32)
Chloride: 105 mEq/L (ref 96–112)
Creatinine, Ser: 0.84 mg/dL (ref 0.50–1.35)
GFR calc non Af Amer: >90 mL/min (ref >90)
Potassium: 3.5 mEq/L (ref 3.5–5.1)
Total Bilirubin: 0.1 mg/dL — ABNORMAL LOW (ref 0.3–1.2)

## 2013-02-28 MED ORDER — LORAZEPAM 1 MG PO TABS: 0.0000 mg | ORAL_TABLET | Freq: Two times a day (BID) | ORAL | Status: DC

## 2013-02-28 MED ORDER — LORAZEPAM 1 MG PO TABS: 0.0000 mg | ORAL_TABLET | Freq: Four times a day (QID) | ORAL | Status: DC

## 2013-02-28 MED ORDER — VITAMIN B-1 100 MG PO TABS
100.0000 mg | ORAL_TABLET | Freq: Every day | ORAL | Status: DC
  Administered 2013-02-28: 100 mg via ORAL
  Filled 2013-02-28: qty 1

## 2013-02-28 MED ORDER — ADULT MULTIVITAMIN W/MINERALS CH
1.0000 | ORAL_TABLET | Freq: Every day | ORAL | Status: DC
  Administered 2013-02-28: 1 via ORAL
  Filled 2013-02-28: qty 1

## 2013-02-28 MED ORDER — FOLIC ACID 1 MG PO TABS
1.0000 mg | ORAL_TABLET | Freq: Every day | ORAL | Status: DC
  Administered 2013-02-28: 1 mg via ORAL
  Filled 2013-02-28: qty 1

## 2013-02-28 MED ORDER — LORAZEPAM 2 MG/ML IJ SOLN: 1.0000 mg | Freq: Four times a day (QID) | INTRAMUSCULAR | Status: DC | PRN

## 2013-02-28 MED ORDER — THIAMINE HCL 100 MG/ML IJ SOLN: 100.0000 mg | Freq: Every day | INTRAMUSCULAR | Status: DC

## 2013-02-28 MED ORDER — LORAZEPAM 1 MG PO TABS: 1.0000 mg | ORAL_TABLET | Freq: Four times a day (QID) | ORAL | Status: DC | PRN

## 2013-02-28 NOTE — ED Provider Notes (Signed)
Medical screening examination/treatment/procedure(s) were performed by non-physician practitioner and as supervising physician I was immediately available for consultation/collaboration.  Flint Melter, MD 02/28/13 1320

## 2013-02-28 NOTE — ED Provider Notes (Signed)
Medical screening examination/treatment/procedure(s) were performed by non-physician practitioner and as supervising physician I was immediately available for consultation/collaboration. Lilit Cinelli, MD, FACEP   Glenard Keesling L Jeramy Dimmick, MD 02/28/13 2303 

## 2013-02-28 NOTE — ED Notes (Signed)
Pt unable to void at this time. Urinal at bedside.

## 2013-02-28 NOTE — ED Provider Notes (Signed)
History    This chart was scribed for non-physician practitioner Roxy Horseman PA-C working with Ward Givens, MD by Smitty Pluck, ED scribe. This patient was seen in room WTR4/WLPT4 and the patient's care was started at 3:16 PM.  CSN: [id number] Arrival date & time 02/28/13  1407    Chief Complaint  Patient presents with  . Medical Clearance   The history is provided by medical records and the EMS personnel. No language interpreter was used.  Pt is level 5 caveat due to condition of patient. Jeffrey [name] is a 57 y.o. male who presents to the Emergency Department BIB EMS with chief complaint of SI. Per EMS pt was found in [employer] parking lot and he stated that he wanted to die. Per EMS pt planned to drink Listerine.    Past Medical History  Diagnosis Date  . DDD (degenerative disc disease)   . Necrotizing fasciitis   . Degenerative disc disease   . Hypertension   . Degenerative disc disease   . Hepatitis C   . Chronic pain   . DDD (degenerative disc disease)   . Parkinson disease   . Mental disorder   . Depression   . DDD (degenerative disc disease)   . DDD (degenerative disc disease), lumbar    Past Surgical History  Procedure Laterality Date  . Hip surgery    . Appendectomy    . Ankle surgery    . Incision and drainage of wound N/A 11/26/2012    Procedure: IRRIGATION AND DEBRIDEMENT WOUND;  Surgeon: Emelia Loron, MD;  Location: [facility] OR;  Service: General;  Laterality: N/A;  . Wound exploration N/A 11/26/2012    Procedure: WOUND EXPLORATION;  Surgeon: Emelia Loron, MD;  Location: [facility] OR;  Service: General;  Laterality: N/A;   No family history on file. History  Substance Use Topics  . Smoking status: Current Every Day Smoker -- 1.00 packs/day    Types: Cigarettes    Last Attempt to Quit: 11/27/2012  . Smokeless tobacco: Current User    Types: Chew  . Alcohol Use: 1.5 oz/week     Comment: anything he can get his hands on    Review of Systems  Unable  to perform ROS   Allergies  Review of patient's allergies indicates no known allergies.  Home Medications   Current Outpatient Rx  Name  Route  Sig  Dispense  Refill  . divalproex (DEPAKOTE) 500 MG DR tablet   Oral   Take 1 tablet (500 mg total) by mouth every 12 (twelve) hours. For mood stabilization   60 tablet   0   . DULoxetine (CYMBALTA) 60 MG capsule   Oral   Take 1 capsule (60 mg total) by mouth daily. For depression/pain control   30 capsule   0   . gabapentin (NEURONTIN) 300 MG capsule   Oral   Take 3 capsules (900 mg total) by mouth 3 (three) times daily. For anxiety/pain control   270 capsule   0   . metoprolol succinate (TOPROL-XL) 25 MG 24 hr tablet   Oral   Take 1 tablet (25 mg total) by mouth daily. For high blood pressure control         . potassium chloride SA (K-DUR,KLOR-CON) 20 MEQ tablet   Oral   Take 1 tablet (20 mEq total) by mouth 2 (two) times daily. For low potassium   60 tablet   0   . thiamine 100 MG tablet   Oral   Take  1 tablet (100 mg total) by mouth daily. For low thiamine   30 tablet   0   . tiZANidine (ZANAFLEX) 4 MG tablet   Oral   Take 1 tablet (4 mg total) by mouth every 8 (eight) hours as needed. For muscle pain   90 tablet   0   . traMADol (ULTRAM) 50 MG tablet   Oral   Take 1 tablet (50 mg total) by mouth 2 (two) times daily as needed for pain.   30 tablet      . cephALEXin (KEFLEX) 500 MG capsule   Oral   Take 1 capsule (500 mg total) by mouth every 12 (twelve) hours.   14 capsule   0   . oxyCODONE-acetaminophen (PERCOCET/ROXICET) 5-325 MG per tablet   Oral   Take 1 tablet by mouth every 4 (four) hours as needed for pain.   30 tablet   0   . traZODone (DESYREL) 50 MG tablet   Oral   Take 1 tablet (50 mg total) by mouth at bedtime as needed and may repeat dose one time if needed for sleep.   60 tablet   0    BP 121/78  Pulse 81  Temp(Src) 97.9 F (36.6 C) (Oral)  Resp 16  SpO2 93% Physical Exam   Nursing note and vitals reviewed. Constitutional: He is oriented to person, place, and time. He appears well-developed and well-nourished. No distress.  Smells of listerine  HENT:  Head: Normocephalic and atraumatic.  Neck: Normal range of motion. Neck supple. No JVD present. No tracheal deviation present.  Cardiovascular: Normal rate, regular rhythm and normal heart sounds.  Exam reveals no gallop and no friction rub.   No murmur heard. Pulmonary/Chest: Effort normal and breath sounds normal. No respiratory distress. He has no wheezes. He has no rales. He exhibits no tenderness.  Abdominal: Soft. Bowel sounds are normal. He exhibits no distension and no mass. There is no tenderness. There is no rebound and no guarding.  Musculoskeletal: Normal range of motion. He exhibits no edema and no tenderness.  Neurological: He is alert and oriented to person, place, and time. He has normal reflexes.  CN 3-12 intact  Skin: Skin is warm and dry.  Psychiatric: His behavior is normal.  Somnolent     ED Course  Procedures (including critical care time) DIAGNOSTIC STUDIES: Oxygen Saturation is 93% on room air, low by my interpretation.    COORDINATION OF CARE: 3:19 PM Discussed ED treatment with pt and pt agrees.     Labs Reviewed  CBC  COMPREHENSIVE METABOLIC PANEL  ETHANOL  URINE RAPID DRUG SCREEN (HOSP PERFORMED)   Results for orders placed during the hospital encounter of 02/28/13  CBC      Result Value Range   WBC 8.9  4.0 - 10.5 K/uL   RBC 4.90  4.22 - 5.81 MIL/uL   Hemoglobin 14.0  13.0 - 17.0 g/dL   HCT 47.8  29.5 - 62.1 %   MCV 87.3  78.0 - 100.0 fL   MCH 28.6  26.0 - 34.0 pg   MCHC 32.7  30.0 - 36.0 g/dL   RDW 30.8  65.7 - 84.6 %   Platelets 326  150 - 400 K/uL  COMPREHENSIVE METABOLIC PANEL      Result Value Range   Sodium 144  135 - 145 mEq/L   Potassium 3.5  3.5 - 5.1 mEq/L   Chloride 105  96 - 112 mEq/L   CO2 20  19 -  32 mEq/L   Glucose, Bld 97  70 - 99 mg/dL    BUN 18  6 - 23 mg/dL   Creatinine, Ser 1.61  0.50 - 1.35 mg/dL   Calcium 9.5  8.4 - 09.6 mg/dL   Total Protein 8.0  6.0 - 8.3 g/dL   Albumin 3.5  3.5 - 5.2 g/dL   AST 21  0 - 37 U/L   ALT 11  0 - 53 U/L   Alkaline Phosphatase 88  39 - 117 U/L   Total Bilirubin 0.1 (*) 0.3 - 1.2 mg/dL   GFR calc non Af Amer >90  >90 mL/min   GFR calc Af Amer >90  >90 mL/min  ETHANOL      Result Value Range   Alcohol, Ethyl (B) 121 (*) 0 - 11 mg/dL     Problem List Items Addressed This Visit   ETOH abuse    Other Visit Diagnoses   Homeless    -  Primary        MDM  Patient with alcohol abuse, complaining of SI. He is been seen here 28 time past 6 months. It has been seen several times in the past 2 days. He is medically clear, and I believe the patient has malingering. He is homeless and does not have anywhere to go. He's been evaluated by ACT team repeatedly and has had several telepsych sessions, and has been discharged repeatedly. I do not believe that he is suicidal. Believe that he is in need of a place to stay, however he has burning all the bridges of care facilities which we refer to per ACT team "due to acting out and bad behavior." He's been given shelter resources. In reviewing his previous notes, I do not believe that he is suicidal. His alcohol level is 121. He does not appear to be in active withdrawal. Will discharge the patient. The patient is stable and ready for discharge.   I personally performed the services described in this documentation, which was scribed in my presence. The recorded information has been reviewed and is accurate.     Roxy Horseman, PA-C 02/28/13 1830

## 2013-02-28 NOTE — ED Notes (Signed)
Pt states that he wants to kill himself by drinking listerine.  Pt defecated on himself.

## 2013-02-28 NOTE — ED Notes (Signed)
Per EMS, was found down at the [facility], states he "wants to die"-was here for symptoms last night

## 2013-03-01 LAB — URINE CULTURE

## 2013-03-01 NOTE — Progress Notes (Signed)
Patient Discharge Instructions:  After Visit Summary (AVS):   Faxed to:  03/01/13 Discharge Summary Note:   Faxed to:  03/01/13 Psychiatric Admission Assessment Note:   Faxed to:  03/01/13 Suicide Risk Assessment - Discharge Assessment:   Faxed to:  03/01/13 Faxed/Sent to the Next Level Care provider:  03/01/13 Next Level Care Provider Has Access to the EMR, 03/01/13 Faxed to [facility] @ [phone] Records provided to [facility] Urgent care via [facility]/Epic access  Jerelene Redden, 03/01/2013, 3:21 PM

## 2013-03-02 ENCOUNTER — Encounter (HOSPITAL_COMMUNITY): Payer: Self-pay | Admitting: Emergency Medicine

## 2013-03-02 ENCOUNTER — Emergency Department (HOSPITAL_COMMUNITY)
Admission: EM | Admit: 2013-03-02 | Discharge: 2013-03-02 | Discharge status: Against Medical Advice | Payer: Medicaid Other | Attending: Emergency Medicine | Admitting: Emergency Medicine

## 2013-03-02 DIAGNOSIS — Z993 Dependence on wheelchair: Secondary | ICD-10-CM | POA: Insufficient documentation

## 2013-03-02 DIAGNOSIS — F1910 Other psychoactive substance abuse, uncomplicated: Secondary | ICD-10-CM | POA: Insufficient documentation

## 2013-03-02 DIAGNOSIS — I10 Essential (primary) hypertension: Secondary | ICD-10-CM | POA: Insufficient documentation

## 2013-03-02 DIAGNOSIS — IMO0002 Reserved for concepts with insufficient information to code with codable children: Secondary | ICD-10-CM | POA: Insufficient documentation

## 2013-03-02 DIAGNOSIS — Y929 Unspecified place or not applicable: Secondary | ICD-10-CM | POA: Insufficient documentation

## 2013-03-02 DIAGNOSIS — G8929 Other chronic pain: Secondary | ICD-10-CM | POA: Insufficient documentation

## 2013-03-02 DIAGNOSIS — W050XXA Fall from non-moving wheelchair, initial encounter: Secondary | ICD-10-CM | POA: Insufficient documentation

## 2013-03-02 DIAGNOSIS — Y939 Activity, unspecified: Secondary | ICD-10-CM | POA: Insufficient documentation

## 2013-03-02 DIAGNOSIS — M726 Necrotizing fasciitis: Secondary | ICD-10-CM | POA: Insufficient documentation

## 2013-03-02 DIAGNOSIS — Z79899 Other long term (current) drug therapy: Secondary | ICD-10-CM | POA: Insufficient documentation

## 2013-03-02 DIAGNOSIS — G20A1 Parkinson's disease without dyskinesia, without mention of fluctuations: Secondary | ICD-10-CM | POA: Insufficient documentation

## 2013-03-02 DIAGNOSIS — F17200 Nicotine dependence, unspecified, uncomplicated: Secondary | ICD-10-CM | POA: Insufficient documentation

## 2013-03-02 DIAGNOSIS — F489 Nonpsychotic mental disorder, unspecified: Secondary | ICD-10-CM | POA: Insufficient documentation

## 2013-03-02 DIAGNOSIS — Z8619 Personal history of other infectious and parasitic diseases: Secondary | ICD-10-CM | POA: Insufficient documentation

## 2013-03-02 DIAGNOSIS — G20 Parkinson's disease: Secondary | ICD-10-CM | POA: Insufficient documentation

## 2013-03-02 NOTE — ED Notes (Signed)
Post ED Visit - Positive Culture Follow-up  Culture report reviewed by antimicrobial stewardship pharmacist: []  Wes Dulaney, Pharm.D., BCPS [x]  Celedonio Miyamoto, Pharm.D., BCPS []  Georgina Pillion, Pharm.D., BCPS []  [place], [address].D., BCPS, AAHIVP []  Estella Husk, Pharm.D., BCPS, AAHIVP  Positive urine culture Treated with Cephalexin organism sensitive to the same and no further patient follow-up is required at this time.  Larena Sox 03/02/2013, 2:03 PM

## 2013-03-02 NOTE — ED Notes (Signed)
[place] the woods et[place] and listerine, pt states that he is SI and wants to take some heroin. Pt yelling and cursing at staff. Brought in by ems

## 2013-03-02 NOTE — ED Notes (Signed)
Pt was given scrubs to change and clean self. PT had 5 $ with him and said that he would pay for his own bus pass. Pt took his belongings with him.

## 2013-03-03 ENCOUNTER — Emergency Department (HOSPITAL_COMMUNITY)
Admission: EM | Admit: 2013-03-03 | Discharge: 2013-03-03 | Disposition: A | Discharge status: Home / Self Care | Payer: Medicaid Other | Attending: Emergency Medicine | Admitting: Emergency Medicine

## 2013-03-03 ENCOUNTER — Encounter (HOSPITAL_COMMUNITY): Payer: Self-pay | Admitting: *Deleted

## 2013-03-03 DIAGNOSIS — B1920 Unspecified viral hepatitis C without hepatic coma: Secondary | ICD-10-CM | POA: Insufficient documentation

## 2013-03-03 DIAGNOSIS — F10220 Alcohol dependence with intoxication, uncomplicated: Secondary | ICD-10-CM

## 2013-03-03 DIAGNOSIS — F17200 Nicotine dependence, unspecified, uncomplicated: Secondary | ICD-10-CM | POA: Insufficient documentation

## 2013-03-03 DIAGNOSIS — G8929 Other chronic pain: Secondary | ICD-10-CM | POA: Insufficient documentation

## 2013-03-03 DIAGNOSIS — F10229 Alcohol dependence with intoxication, unspecified: Secondary | ICD-10-CM | POA: Insufficient documentation

## 2013-03-03 DIAGNOSIS — Z590 Homelessness: Secondary | ICD-10-CM

## 2013-03-03 DIAGNOSIS — M726 Necrotizing fasciitis: Secondary | ICD-10-CM | POA: Insufficient documentation

## 2013-03-03 DIAGNOSIS — M5137 Other intervertebral disc degeneration, lumbosacral region: Secondary | ICD-10-CM | POA: Insufficient documentation

## 2013-03-03 DIAGNOSIS — Z591 Inadequate housing, unspecified: Secondary | ICD-10-CM | POA: Insufficient documentation

## 2013-03-03 DIAGNOSIS — F322 Major depressive disorder, single episode, severe without psychotic features: Secondary | ICD-10-CM | POA: Insufficient documentation

## 2013-03-03 DIAGNOSIS — M51379 Other intervertebral disc degeneration, lumbosacral region without mention of lumbar back pain or lower extremity pain: Secondary | ICD-10-CM | POA: Insufficient documentation

## 2013-03-03 DIAGNOSIS — Z79899 Other long term (current) drug therapy: Secondary | ICD-10-CM | POA: Insufficient documentation

## 2013-03-03 DIAGNOSIS — G20A1 Parkinson's disease without dyskinesia, without mention of fluctuations: Secondary | ICD-10-CM | POA: Insufficient documentation

## 2013-03-03 DIAGNOSIS — I10 Essential (primary) hypertension: Secondary | ICD-10-CM | POA: Insufficient documentation

## 2013-03-03 DIAGNOSIS — G20 Parkinson's disease: Secondary | ICD-10-CM | POA: Insufficient documentation

## 2013-03-03 DIAGNOSIS — F489 Nonpsychotic mental disorder, unspecified: Secondary | ICD-10-CM | POA: Insufficient documentation

## 2013-03-03 LAB — POCT I-STAT, CHEM 8
BUN: 20 mg/dL (ref 6–23)
Calcium, Ion: 1.07 mmol/L — ABNORMAL LOW (ref 1.12–1.23)
Hemoglobin: 15 g/dL (ref 13.0–17.0)
Sodium: 145 mEq/L (ref 135–145)
TCO2: 19 mmol/L (ref 0–100)

## 2013-03-03 LAB — ETHANOL: Alcohol, Ethyl (B): 191 mg/dL — ABNORMAL HIGH (ref 0–11)

## 2013-03-03 NOTE — ED Notes (Signed)
[id number]<AV> Expected date:<BR> Expected time:<BR> Means of arrival:<BR> Comments:<BR> Pt in hallway [place] [facility]

## 2013-03-03 NOTE — ED Notes (Signed)
Pt taken to bathroom with 2 NT and this writer and security on standby,  Pt is wheelchair bound,  He was placed in wheelchair and bathed from head to toe several times,  Then dried and placed on clean bed linens and given sandwich, beverage and warm blankets.

## 2013-03-03 NOTE — ED Notes (Signed)
Pt arrived, covered in feces and urine,  Open sores that he picks and they bleed, pt states he is suicidal and his intention is to overdose on Herion

## 2013-03-03 NOTE — ED Notes (Signed)
Patient's wheelchair moved from EMS bay into decontamination room.

## 2013-03-03 NOTE — ED Notes (Signed)
Per EMS they were called out because pt found behind store on street passed out,  Pt states he is now SI,  Pt arrived covered in feces,  Smells of listerine and is verbally abusive,  [employer] is on way with his wheelchair ,  Pt is wheelchair bound

## 2013-03-03 NOTE — ED Provider Notes (Signed)
History    CSN: [id number] Arrival date & time 03/03/13  0005  First MD Initiated Contact with Patient 03/03/13 [phone]     Chief Complaint  Patient presents with  . Medical Clearance     HPI EMS reports that they were called out because the patient was found passed out behind at a store.  He does admit to drinking alcohol today.  As reported that he had a lot of feces on him.  The patient is well-known to this emergency department with 29 visits in the past 6 months.  He was discharged from behavior health several days ago.  Social work has been involved on multiple occasions and all community resources a bit exhausted.  The patient is homeless.  We do not have a great alternative place for this patient ago.  As reported that he stated suicidal thoughts to EMS.  He states he has no true plans of suicide.  He states all he wanted was to be put back in his wheelchair but then said that he come to the emergency department.  He has no complaints at this time.   Past Medical History  Diagnosis Date  . DDD (degenerative disc disease)   . Necrotizing fasciitis   . Degenerative disc disease   . Hypertension   . Degenerative disc disease   . Hepatitis C   . Chronic pain   . DDD (degenerative disc disease)   . Parkinson disease   . Mental disorder   . Depression   . DDD (degenerative disc disease)   . DDD (degenerative disc disease), lumbar    Past Surgical History  Procedure Laterality Date  . Hip surgery    . Appendectomy    . Ankle surgery    . Incision and drainage of wound N/A 11/26/2012    Procedure: IRRIGATION AND DEBRIDEMENT WOUND;  Surgeon: Emelia Loron, MD;  Location: [facility] OR;  Service: General;  Laterality: N/A;  . Wound exploration N/A 11/26/2012    Procedure: WOUND EXPLORATION;  Surgeon: Emelia Loron, MD;  Location: [facility] OR;  Service: General;  Laterality: N/A;   History reviewed. No pertinent family history. History  Substance Use Topics  . Smoking status: Current  Every Day Smoker -- 1.00 packs/day    Types: Cigarettes    Last Attempt to Quit: 11/27/2012  . Smokeless tobacco: Current User    Types: Chew  . Alcohol Use: 1.5 oz/week     Comment: anything he can get his hands on    Review of Systems  All other systems reviewed and are negative.    Allergies  Review of patient's allergies indicates no known allergies.  Home Medications   Current Outpatient Rx  Name  Route  Sig  Dispense  Refill  . cephALEXin (KEFLEX) 500 MG capsule   Oral   Take 1 capsule (500 mg total) by mouth every 12 (twelve) hours.   14 capsule   0   . divalproex (DEPAKOTE) 500 MG DR tablet   Oral   Take 1 tablet (500 mg total) by mouth every 12 (twelve) hours. For mood stabilization   60 tablet   0   . DULoxetine (CYMBALTA) 60 MG capsule   Oral   Take 1 capsule (60 mg total) by mouth daily. For depression/pain control   30 capsule   0   . gabapentin (NEURONTIN) 300 MG capsule   Oral   Take 3 capsules (900 mg total) by mouth 3 (three) times daily. For anxiety/pain control  270 capsule   0   . metoprolol succinate (TOPROL-XL) 25 MG 24 hr tablet   Oral   Take 1 tablet (25 mg total) by mouth daily. For high blood pressure control         . oxyCODONE-acetaminophen (PERCOCET/ROXICET) 5-325 MG per tablet   Oral   Take 1 tablet by mouth every 4 (four) hours as needed for pain.   30 tablet   0   . potassium chloride SA (K-DUR,KLOR-CON) 20 MEQ tablet   Oral   Take 1 tablet (20 mEq total) by mouth 2 (two) times daily. For low potassium   60 tablet   0   . thiamine 100 MG tablet   Oral   Take 1 tablet (100 mg total) by mouth daily. For low thiamine   30 tablet   0   . tiZANidine (ZANAFLEX) 4 MG tablet   Oral   Take 1 tablet (4 mg total) by mouth every 8 (eight) hours as needed. For muscle pain   90 tablet   0   . traMADol (ULTRAM) 50 MG tablet   Oral   Take 1 tablet (50 mg total) by mouth 2 (two) times daily as needed for pain.   30 tablet       . traZODone (DESYREL) 50 MG tablet   Oral   Take 1 tablet (50 mg total) by mouth at bedtime as needed and may repeat dose one time if needed for sleep.   60 tablet   0    BP 133/85  Pulse 67  Temp(Src) 98.5 F (36.9 C) (Oral)  Resp 18  SpO2 95% Physical Exam  Nursing note and vitals reviewed. Constitutional: He is oriented to person, place, and time. He appears well-developed and well-nourished.  HENT:  Head: Normocephalic and atraumatic.  Eyes: EOM are normal.  Neck: Normal range of motion.  Cardiovascular: Normal rate, regular rhythm, normal heart sounds and intact distal pulses.   Pulmonary/Chest: Effort normal and breath sounds normal. No respiratory distress.  Abdominal: Soft. He exhibits no distension. There is no tenderness.  Genitourinary: Rectum normal.  Musculoskeletal: Normal range of motion.  Neurological: He is alert and oriented to person, place, and time.  Skin: Skin is warm and dry.  Psychiatric: He has a normal mood and affect. Judgment normal. He expresses no homicidal and no suicidal ideation.    ED Course  Procedures (including critical care time) Labs Reviewed  ETHANOL - Abnormal; Notable for the following:    Alcohol, Ethyl (B) 191 (*)    All other components within normal limits  POCT I-STAT, CHEM 8 - Abnormal; Notable for the following:    Calcium, Ion 1.07 (*)    All other components within normal limits   No results found. 1. Alcohol intoxication in active alcoholic, uncomplicated   2. Major depressive disorder, single episode, severe, without mention of psychotic behavior   3. Homeless     MDM  The patient will be discharged home at this time.  He was offered to stay in our waiting room until the buses run in the morning.  He has his wheelchair with him.  I do not believe the patient to be a threat to himself or other.  The patient is an alcoholic with significant alcohol abuse issue.  He is not interested in stopping his alcohol use at  this time  Lyanne Co, MD 03/03/13 [phone]

## 2013-03-03 NOTE — ED Notes (Signed)
ZOX:WRUEA<VW> Expected date:03/02/13<BR> Expected time:11:45 PM<BR> Means of arrival:Ambulance<BR> Comments:<BR> Medical clearance

## 2013-03-07 NOTE — ED Provider Notes (Signed)
History     56yM presenting after fall from wheelchair. Pt states that it tilted backwards. Not sure if struck head. No LOC. Denies any acute pain from fall. Pt unable to get self back up to wheelchair and called for help which is what prompted EMS to be involved. Pt known to ED. Alcohol and psychiatric issues. Report of SI. Pt says he has a "shitty life" and is tired of it. No concrete plan. "I'd probably take a bunch of heroin."  Pt states he more or less always feels this way.     CSN: [id number] Arrival date & time 03/02/13  0908  First MD Initiated Contact with Patient 03/02/13 [phone]     Chief Complaint  Patient presents with  . Medical Clearance   (Consider location/radiation/quality/duration/timing/severity/associated sxs/prior Treatment) HPI Past Medical History  Diagnosis Date  . DDD (degenerative disc disease)   . Necrotizing fasciitis   . Degenerative disc disease   . Hypertension   . Degenerative disc disease   . Hepatitis C   . Chronic pain   . DDD (degenerative disc disease)   . Parkinson disease   . Mental disorder   . Depression   . DDD (degenerative disc disease)   . DDD (degenerative disc disease), lumbar    Past Surgical History  Procedure Laterality Date  . Hip surgery    . Appendectomy    . Ankle surgery    . Incision and drainage of wound N/A 11/26/2012    Procedure: IRRIGATION AND DEBRIDEMENT WOUND;  Surgeon: Emelia Loron, MD;  Location: [facility] OR;  Service: General;  Laterality: N/A;  . Wound exploration N/A 11/26/2012    Procedure: WOUND EXPLORATION;  Surgeon: Emelia Loron, MD;  Location: [facility] OR;  Service: General;  Laterality: N/A;   No family history on file. History  Substance Use Topics  . Smoking status: Current Every Day Smoker -- 1.00 packs/day    Types: Cigarettes    Last Attempt to Quit: 11/27/2012  . Smokeless tobacco: Current User    Types: Chew  . Alcohol Use: 1.5 oz/week     Comment: anything he can get his hands on     Review of Systems  All systems reviewed and negative, other than as noted in HPI.   Allergies  Review of patient's allergies indicates no known allergies.  Home Medications   Current Outpatient Rx  Name  Route  Sig  Dispense  Refill  . cephALEXin (KEFLEX) 500 MG capsule   Oral   Take 1 capsule (500 mg total) by mouth every 12 (twelve) hours.   14 capsule   0   . divalproex (DEPAKOTE) 500 MG DR tablet   Oral   Take 1 tablet (500 mg total) by mouth every 12 (twelve) hours. For mood stabilization   60 tablet   0   . DULoxetine (CYMBALTA) 60 MG capsule   Oral   Take 1 capsule (60 mg total) by mouth daily. For depression/pain control   30 capsule   0   . gabapentin (NEURONTIN) 300 MG capsule   Oral   Take 3 capsules (900 mg total) by mouth 3 (three) times daily. For anxiety/pain control   270 capsule   0   . metoprolol succinate (TOPROL-XL) 25 MG 24 hr tablet   Oral   Take 1 tablet (25 mg total) by mouth daily. For high blood pressure control         . oxyCODONE-acetaminophen (PERCOCET/ROXICET) 5-325 MG per tablet   Oral  Take 1 tablet by mouth every 4 (four) hours as needed for pain.   30 tablet   0   . potassium chloride SA (K-DUR,KLOR-CON) 20 MEQ tablet   Oral   Take 1 tablet (20 mEq total) by mouth 2 (two) times daily. For low potassium   60 tablet   0   . thiamine 100 MG tablet   Oral   Take 1 tablet (100 mg total) by mouth daily. For low thiamine   30 tablet   0   . tiZANidine (ZANAFLEX) 4 MG tablet   Oral   Take 1 tablet (4 mg total) by mouth every 8 (eight) hours as needed. For muscle pain   90 tablet   0   . traMADol (ULTRAM) 50 MG tablet   Oral   Take 1 tablet (50 mg total) by mouth 2 (two) times daily as needed for pain.   30 tablet      . traZODone (DESYREL) 50 MG tablet   Oral   Take 1 tablet (50 mg total) by mouth at bedtime as needed and may repeat dose one time if needed for sleep.   60 tablet   0    Pulse 74  Temp(Src)  97.4 F (36.3 C) (Oral)  Resp 20  SpO2 99% Physical Exam  Nursing note and vitals reviewed. Constitutional: He is oriented to person, place, and time.  Dishevled and chronically ill appearing.   HENT:  Head: Normocephalic and atraumatic.  Eyes: Conjunctivae are normal. Right eye exhibits no discharge. Left eye exhibits no discharge.  Neck: Neck supple.  Cardiovascular: Normal rate, regular rhythm and normal heart sounds.  Exam reveals no gallop and no friction rub.   No murmur heard. Pulmonary/Chest: Effort normal and breath sounds normal. No respiratory distress.  Abdominal: Soft. He exhibits no distension. There is no tenderness.  Musculoskeletal: He exhibits no edema and no tenderness.  No bony tenderness of extremities or midline spinal tenderness  Neurological: He is alert and oriented to person, place, and time.  Skin: Skin is warm and dry.  Psychiatric: Thought content normal.    ED Course  Procedures (including critical care time) Labs Reviewed - No data to display No results found. 1. Fall from wheelchair, initial encounter   2. Substance abuse     MDM  56yM with fall from wheelchair. Denies acute pain. Pt given option to speak with psychiatry but is declining. Physical exam w/o evidence of acute injury. No further w/u. Pt has ways of getting back to emergency room if he needs to. Return precautions dicussed.   Raeford Razor, MD 03/07/13 1041

## 2013-03-10 ENCOUNTER — Emergency Department (HOSPITAL_COMMUNITY): Payer: Medicaid Other

## 2013-03-10 ENCOUNTER — Emergency Department (HOSPITAL_COMMUNITY)
Admission: EM | Admit: 2013-03-10 | Discharge: 2013-03-10 | Disposition: A | Discharge status: Home / Self Care | Payer: Medicaid Other | Attending: Emergency Medicine | Admitting: Emergency Medicine

## 2013-03-10 DIAGNOSIS — G20 Parkinson's disease: Secondary | ICD-10-CM | POA: Insufficient documentation

## 2013-03-10 DIAGNOSIS — Y929 Unspecified place or not applicable: Secondary | ICD-10-CM | POA: Insufficient documentation

## 2013-03-10 DIAGNOSIS — G20A1 Parkinson's disease without dyskinesia, without mention of fluctuations: Secondary | ICD-10-CM | POA: Insufficient documentation

## 2013-03-10 DIAGNOSIS — F17200 Nicotine dependence, unspecified, uncomplicated: Secondary | ICD-10-CM | POA: Insufficient documentation

## 2013-03-10 DIAGNOSIS — R599 Enlarged lymph nodes, unspecified: Secondary | ICD-10-CM | POA: Insufficient documentation

## 2013-03-10 DIAGNOSIS — F1010 Alcohol abuse, uncomplicated: Secondary | ICD-10-CM | POA: Insufficient documentation

## 2013-03-10 DIAGNOSIS — F329 Major depressive disorder, single episode, unspecified: Secondary | ICD-10-CM | POA: Insufficient documentation

## 2013-03-10 DIAGNOSIS — Z8619 Personal history of other infectious and parasitic diseases: Secondary | ICD-10-CM | POA: Insufficient documentation

## 2013-03-10 DIAGNOSIS — IMO0002 Reserved for concepts with insufficient information to code with codable children: Secondary | ICD-10-CM | POA: Insufficient documentation

## 2013-03-10 DIAGNOSIS — Z79899 Other long term (current) drug therapy: Secondary | ICD-10-CM | POA: Insufficient documentation

## 2013-03-10 DIAGNOSIS — F3289 Other specified depressive episodes: Secondary | ICD-10-CM | POA: Insufficient documentation

## 2013-03-10 DIAGNOSIS — Z8739 Personal history of other diseases of the musculoskeletal system and connective tissue: Secondary | ICD-10-CM | POA: Insufficient documentation

## 2013-03-10 DIAGNOSIS — Y939 Activity, unspecified: Secondary | ICD-10-CM | POA: Insufficient documentation

## 2013-03-10 DIAGNOSIS — G8929 Other chronic pain: Secondary | ICD-10-CM | POA: Insufficient documentation

## 2013-03-10 DIAGNOSIS — Z23 Encounter for immunization: Secondary | ICD-10-CM | POA: Insufficient documentation

## 2013-03-10 DIAGNOSIS — W050XXA Fall from non-moving wheelchair, initial encounter: Secondary | ICD-10-CM | POA: Insufficient documentation

## 2013-03-10 DIAGNOSIS — I10 Essential (primary) hypertension: Secondary | ICD-10-CM | POA: Insufficient documentation

## 2013-03-10 DIAGNOSIS — S0990XA Unspecified injury of head, initial encounter: Secondary | ICD-10-CM | POA: Insufficient documentation

## 2013-03-10 LAB — COMPREHENSIVE METABOLIC PANEL
ALT: 9 U/L (ref 0–53)
Albumin: 3.2 g/dL — ABNORMAL LOW (ref 3.5–5.2)
Calcium: 8.8 mg/dL (ref 8.4–10.5)
GFR calc Af Amer: >90 mL/min (ref >90)
Glucose, Bld: 93 mg/dL (ref 70–99)
Potassium: 3.2 mEq/L — ABNORMAL LOW (ref 3.5–5.1)
Sodium: 139 mEq/L (ref 135–145)
Total Protein: 7.5 g/dL (ref 6.0–8.3)

## 2013-03-10 LAB — CBC WITH DIFFERENTIAL/PLATELET
Basophils Absolute: 0.1 10*3/uL (ref 0.0–0.1)
Basophils Relative: 1 % (ref 0–1)
Eosinophils Relative: 3 % (ref 0–5)
HCT: 41.6 % (ref 39.0–52.0)
MCHC: 33.9 g/dL (ref 30.0–36.0)
MCV: 83.5 fL (ref 78.0–100.0)
Monocytes Absolute: 0.8 10*3/uL (ref 0.1–1.0)
Neutro Abs: 5.8 10*3/uL (ref 1.7–7.7)
RDW: 16 % — ABNORMAL HIGH (ref 11.5–15.5)

## 2013-03-10 LAB — URINALYSIS, ROUTINE W REFLEX MICROSCOPIC
Bilirubin Urine: NEGATIVE
Glucose, UA: NEGATIVE mg/dL
Leukocytes, UA: NEGATIVE
Nitrite: NEGATIVE
Specific Gravity, Urine: 1.01 (ref 1.005–1.030)
pH: 6.5 (ref 5.0–8.0)

## 2013-03-10 LAB — RAPID URINE DRUG SCREEN, HOSP PERFORMED
Amphetamines: NOT DETECTED
Benzodiazepines: POSITIVE — AB
Cocaine: NOT DETECTED

## 2013-03-10 LAB — VALPROIC ACID LEVEL: Valproic Acid Lvl: <10 ug/mL — ABNORMAL LOW (ref 50.0–100.0)

## 2013-03-10 MED ORDER — METOPROLOL SUCCINATE ER 25 MG PO TB24
25.0000 mg | ORAL_TABLET | Freq: Every day | ORAL | Status: DC
  Administered 2013-03-10: 25 mg via ORAL
  Filled 2013-03-10: qty 1

## 2013-03-10 MED ORDER — TRAZODONE HCL 50 MG PO TABS
50.0000 mg | ORAL_TABLET | Freq: Every evening | ORAL | Status: DC | PRN
  Filled 2013-03-10: qty 1

## 2013-03-10 MED ORDER — ACETAMINOPHEN 325 MG PO TABS: 650.0000 mg | ORAL_TABLET | ORAL | Status: DC | PRN

## 2013-03-10 MED ORDER — ZOLPIDEM TARTRATE 5 MG PO TABS: 5.0000 mg | ORAL_TABLET | Freq: Every evening | ORAL | Status: DC | PRN

## 2013-03-10 MED ORDER — TIZANIDINE HCL 4 MG PO TABS
4.0000 mg | ORAL_TABLET | Freq: Three times a day (TID) | ORAL | Status: DC | PRN
  Administered 2013-03-10: 4 mg via ORAL
  Filled 2013-03-10: qty 1

## 2013-03-10 MED ORDER — POTASSIUM CHLORIDE CRYS ER 20 MEQ PO TBCR
20.0000 meq | EXTENDED_RELEASE_TABLET | Freq: Two times a day (BID) | ORAL | Status: DC
  Administered 2013-03-10: 20 meq via ORAL
  Filled 2013-03-10: qty 1

## 2013-03-10 MED ORDER — DULOXETINE HCL 60 MG PO CPEP
60.0000 mg | ORAL_CAPSULE | Freq: Every day | ORAL | Status: DC
  Administered 2013-03-10: 60 mg via ORAL
  Filled 2013-03-10: qty 1

## 2013-03-10 MED ORDER — DIVALPROEX SODIUM 500 MG PO DR TAB
500.0000 mg | DELAYED_RELEASE_TABLET | Freq: Two times a day (BID) | ORAL | Status: DC
  Administered 2013-03-10: 500 mg via ORAL
  Filled 2013-03-10: qty 1

## 2013-03-10 MED ORDER — POTASSIUM CHLORIDE CRYS ER 20 MEQ PO TBCR
40.0000 meq | EXTENDED_RELEASE_TABLET | Freq: Once | ORAL | Status: AC
  Administered 2013-03-10: 40 meq via ORAL
  Filled 2013-03-10: qty 2

## 2013-03-10 MED ORDER — IBUPROFEN 200 MG PO TABS
600.0000 mg | ORAL_TABLET | Freq: Three times a day (TID) | ORAL | Status: DC | PRN
  Administered 2013-03-10: 600 mg via ORAL
  Filled 2013-03-10: qty 3

## 2013-03-10 MED ORDER — VITAMIN B-1 100 MG PO TABS
100.0000 mg | ORAL_TABLET | Freq: Every day | ORAL | Status: DC
  Administered 2013-03-10: 100 mg via ORAL
  Filled 2013-03-10: qty 1

## 2013-03-10 MED ORDER — ONDANSETRON HCL 4 MG PO TABS: 4.0000 mg | ORAL_TABLET | Freq: Three times a day (TID) | ORAL | Status: DC | PRN

## 2013-03-10 MED ORDER — NICOTINE 21 MG/24HR TD PT24
21.0000 mg | MEDICATED_PATCH | Freq: Every day | TRANSDERMAL | Status: DC
  Administered 2013-03-10: 21 mg via TRANSDERMAL
  Filled 2013-03-10: qty 1

## 2013-03-10 MED ORDER — LORAZEPAM 1 MG PO TABS
1.0000 mg | ORAL_TABLET | Freq: Three times a day (TID) | ORAL | Status: DC | PRN
  Administered 2013-03-10: 1 mg via ORAL
  Filled 2013-03-10: qty 1

## 2013-03-10 MED ORDER — GABAPENTIN 300 MG PO CAPS
900.0000 mg | ORAL_CAPSULE | Freq: Three times a day (TID) | ORAL | Status: DC
  Administered 2013-03-10: 900 mg via ORAL
  Filled 2013-03-10: qty 3

## 2013-03-10 MED ORDER — TETANUS-DIPHTH-ACELL PERTUSSIS 5-2.5-18.5 LF-MCG/0.5 IM SUSP
0.5000 mL | Freq: Once | INTRAMUSCULAR | Status: AC
  Administered 2013-03-10: 0.5 mL via INTRAMUSCULAR
  Filled 2013-03-10: qty 0.5

## 2013-03-10 MED ORDER — ALUM & MAG HYDROXIDE-SIMETH 200-200-20 MG/5ML PO SUSP: 30.0000 mL | ORAL | Status: DC | PRN

## 2013-03-10 NOTE — ED Notes (Signed)
Pt in radiology at this time. 

## 2013-03-10 NOTE — ED Provider Notes (Addendum)
History    CSN: [id number] Arrival date & time 03/10/13  1433  First MD Initiated Contact with Patient 03/10/13 1446    Low 5 caveat patient intoxicated Chief Complaint  Patient presents with  . Fall  . Homeless  . Suicidal   (Consider location/radiation/quality/duration/timing/severity/associated sxs/prior Treatment) HPI patient fell from his wheelchair this afternoon, fell backwards striking his head. EMS was called by bystanders. Patient found to be combative in the field. Treated by EMS with Haldol 5 mg IM, Haldol 5 mg IV and Versed 5 mg IM.patient  Admits to drinking alcohol earlier today. He complains of low back pain. Since event. Patient was incontinent of urine while en route.  Past Medical History  Diagnosis Date  . DDD (degenerative disc disease)   . Necrotizing fasciitis   . Degenerative disc disease   . Hypertension   . Degenerative disc disease   . Hepatitis C   . Chronic pain   . DDD (degenerative disc disease)   . Parkinson disease   . Mental disorder   . Depression   . DDD (degenerative disc disease)   . DDD (degenerative disc disease), lumbar    Past Surgical History  Procedure Laterality Date  . Hip surgery    . Appendectomy    . Ankle surgery    . Incision and drainage of wound N/A 11/26/2012    Procedure: IRRIGATION AND DEBRIDEMENT WOUND;  Surgeon: Emelia Loron, MD;  Location: [facility] OR;  Service: General;  Laterality: N/A;  . Wound exploration N/A 11/26/2012    Procedure: WOUND EXPLORATION;  Surgeon: Emelia Loron, MD;  Location: [facility] OR;  Service: General;  Laterality: N/A;   No family history on file. History  Substance Use Topics  . Smoking status: Current Every Day Smoker -- 1.00 packs/day    Types: Cigarettes    Last Attempt to Quit: 11/27/2012  . Smokeless tobacco: Current User    Types: Chew  . Alcohol Use: 1.5 oz/week     Comment: anything he can get his hands on    Review of Systems  Unable to perform ROS: Mental status change     Allergies  Review of patient's allergies indicates no known allergies.  Home Medications   Current Outpatient Rx  Name  Route  Sig  Dispense  Refill  . cephALEXin (KEFLEX) 500 MG capsule   Oral   Take 1 capsule (500 mg total) by mouth every 12 (twelve) hours.   14 capsule   0   . divalproex (DEPAKOTE) 500 MG DR tablet   Oral   Take 1 tablet (500 mg total) by mouth every 12 (twelve) hours. For mood stabilization   60 tablet   0   . DULoxetine (CYMBALTA) 60 MG capsule   Oral   Take 1 capsule (60 mg total) by mouth daily. For depression/pain control   30 capsule   0   . gabapentin (NEURONTIN) 300 MG capsule   Oral   Take 3 capsules (900 mg total) by mouth 3 (three) times daily. For anxiety/pain control   270 capsule   0   . metoprolol succinate (TOPROL-XL) 25 MG 24 hr tablet   Oral   Take 1 tablet (25 mg total) by mouth daily. For high blood pressure control         . oxyCODONE-acetaminophen (PERCOCET/ROXICET) 5-325 MG per tablet   Oral   Take 1 tablet by mouth every 4 (four) hours as needed for pain.   30 tablet   0   .  potassium chloride SA (K-DUR,KLOR-CON) 20 MEQ tablet   Oral   Take 1 tablet (20 mEq total) by mouth 2 (two) times daily. For low potassium   60 tablet   0   . thiamine 100 MG tablet   Oral   Take 1 tablet (100 mg total) by mouth daily. For low thiamine   30 tablet   0   . tiZANidine (ZANAFLEX) 4 MG tablet   Oral   Take 1 tablet (4 mg total) by mouth every 8 (eight) hours as needed. For muscle pain   90 tablet   0   . traMADol (ULTRAM) 50 MG tablet   Oral   Take 1 tablet (50 mg total) by mouth 2 (two) times daily as needed for pain.   30 tablet      . traZODone (DESYREL) 50 MG tablet   Oral   Take 1 tablet (50 mg total) by mouth at bedtime as needed and may repeat dose one time if needed for sleep.   60 tablet   0    BP 124/76  Pulse 93  Temp(Src) 97.5 F (36.4 C) (Oral)  Resp 16  SpO2 97% Physical Exam  Nursing note  and vitals reviewed. Constitutional:  Chronically ill-appearing, smells of urine cooperative  HENT:  Abrasion to occiput otherwise atraumatic edentulous  Eyes: Conjunctivae are normal. Pupils are equal, round, and reactive to light.  Neck: Neck supple. No tracheal deviation present. No thyromegaly present.  Cardiovascular: Normal rate and regular rhythm.   No murmur heard. Pulmonary/Chest: Effort normal and breath sounds normal.  Abdominal: Soft. Bowel sounds are normal. He exhibits no distension. There is no tenderness.  Musculoskeletal: Normal range of motion. He exhibits no edema and no tenderness.  Lymphadenopathy:    He has cervical adenopathy.  Neurological: He is alert. Coordination normal.  Moves all extremities follow simple commands  Skin: Skin is warm and dry. No rash noted.  1 cm wound over upper thoracic spine no corresponding tenderness Tender over lumbar spine. Pelvis stable nontender. Cervical spine is nontender. Full range of motion overdose-sized abrasion over right lateral lower leg.  Psychiatric: He has a normal mood and affect.    ED Course  Procedures (including critical care time) Labs Reviewed  COMPREHENSIVE METABOLIC PANEL  CBC WITH DIFFERENTIAL  URINALYSIS, ROUTINE W REFLEX MICROSCOPIC  URINE RAPID DRUG SCREEN (HOSP PERFORMED)  ETHANOL   No results found. No diagnosis found.     EKG Date: 03/10/2013  Rate: 95  Rhythm: normal sinus rhythm  QRS Axis: normal  Intervals: normal  ST/T Wave abnormalities: normal  Conduction Disutrbances:none  Narrative Interpretation:   Old EKG Reviewed: Unchanged from 02/27/2012 interpreted by me Results for orders placed during the hospital encounter of 03/10/13  COMPREHENSIVE METABOLIC PANEL      Result Value Range   Sodium 139  135 - 145 mEq/L   Potassium 3.2 (*) 3.5 - 5.1 mEq/L   Chloride 102  96 - 112 mEq/L   CO2 19  19 - 32 mEq/L   Glucose, Bld 93  70 - 99 mg/dL   BUN 8  6 - 23 mg/dL   Creatinine, Ser 1.61   0.50 - 1.35 mg/dL   Calcium 8.8  8.4 - 09.6 mg/dL   Total Protein 7.5  6.0 - 8.3 g/dL   Albumin 3.2 (*) 3.5 - 5.2 g/dL   AST 27  0 - 37 U/L   ALT 9  0 - 53 U/L   Alkaline Phosphatase 157 (*)  39 - 117 U/L   Total Bilirubin 0.2 (*) 0.3 - 1.2 mg/dL   GFR calc non Af Amer >90  >90 mL/min   GFR calc Af Amer >90  >90 mL/min  URINALYSIS, ROUTINE W REFLEX MICROSCOPIC      Result Value Range   Color, Urine YELLOW  YELLOW   APPearance CLEAR  CLEAR   Specific Gravity, Urine 1.010  1.005 - 1.030   pH 6.5  5.0 - 8.0   Glucose, UA NEGATIVE  NEGATIVE mg/dL   Hgb urine dipstick MODERATE (*) NEGATIVE   Bilirubin Urine NEGATIVE  NEGATIVE   Ketones, ur NEGATIVE  NEGATIVE mg/dL   Protein, ur 161 (*) NEGATIVE mg/dL   Urobilinogen, UA 0.2  0.0 - 1.0 mg/dL   Nitrite NEGATIVE  NEGATIVE   Leukocytes, UA NEGATIVE  NEGATIVE  URINE RAPID DRUG SCREEN (HOSP PERFORMED)      Result Value Range   Opiates NONE DETECTED  NONE DETECTED   Cocaine NONE DETECTED  NONE DETECTED   Benzodiazepines POSITIVE (*) NONE DETECTED   Amphetamines NONE DETECTED  NONE DETECTED   Tetrahydrocannabinol NONE DETECTED  NONE DETECTED   Barbiturates NONE DETECTED  NONE DETECTED  ETHANOL      Result Value Range   Alcohol, Ethyl (B) 236 (*) 0 - 11 mg/dL  CBC WITH DIFFERENTIAL      Result Value Range   WBC 9.0  4.0 - 10.5 K/uL   RBC 4.98  4.22 - 5.81 MIL/uL   Hemoglobin 14.1  13.0 - 17.0 g/dL   HCT 09.6  04.5 - 40.9 %   MCV 83.5  78.0 - 100.0 fL   MCH 28.3  26.0 - 34.0 pg   MCHC 33.9  30.0 - 36.0 g/dL   RDW 81.1 (*) 91.4 - 78.2 %   Platelets 135 (*) 150 - 400 K/uL   Neutrophils Relative % 65  43 - 77 %   Neutro Abs 5.8  1.7 - 7.7 K/uL   Lymphocytes Relative 23  12 - 46 %   Lymphs Abs 2.1  0.7 - 4.0 K/uL   Monocytes Relative 9  3 - 12 %   Monocytes Absolute 0.8  0.1 - 1.0 K/uL   Eosinophils Relative 3  0 - 5 %   Eosinophils Absolute 0.2  0.0 - 0.7 K/uL   Basophils Relative 1  0 - 1 %   Basophils Absolute 0.1  0.0 - 0.1  K/uL  URINE MICROSCOPIC-ADD ON      Result Value Range   Squamous Epithelial / LPF RARE  RARE   WBC, UA 0-2  <3 WBC/hpf   RBC / HPF 0-2  <3 RBC/hpf   Dg Thoracic Spine 2 View  03/10/2013   *RADIOLOGY REPORT*  Clinical Data: Fall.  Ethanol intoxication.  THORACIC SPINE - 2 VIEW  Comparison: 01/29/2013  Findings: Old right lower posterolateral rib fractures appear healed.  Cervicothoracic junction obscured by the patient's shoulders despite use of a swimmer's view.  Thoracic spondylosis noted without acute fracture identified.  Suboptimal lateral projection due to cooperation issues.  IMPRESSION:  1.  Thoracic spondylosis without fracture or acute subluxation identified.  Reduced sensitivity due to obscuration of the cervicothoracic junction of portions of the upper thoracic spine due to cooperation issues.   Original Report Authenticated By: Gaylyn Rong, M.D.   Dg Lumbar Spine Complete  03/10/2013   *RADIOLOGY REPORT*  Clinical Data: Fall.  Ethanol intoxication.  LUMBAR SPINE - COMPLETE 4+ VIEW  Comparison: 08/27/2012  Findings: Dextroconvex lumbar scoliosis noted with rotary component.  Lumbar spondylosis is present with interbody bridging spurring.  The patient was not able to cooperate with imaging in the lateral view is suboptimal, but similar to the prior exam.  No compelling findings of new fracture.  Aortoiliac atherosclerosis noted.  IMPRESSION:  1.  Prominent lumbar spondylosis and scoliosis.  The patient is unable to cooperate with imaging using suboptimal projections and reduced negative predictive value.  However, no fracture or acute subluxation is identified.   Original Report Authenticated By: Gaylyn Rong, M.D.   Dg Pelvis 1-2 Views  03/10/2013   *RADIOLOGY REPORT*  Clinical Data: Possible fall. Ethanol intoxication.  PELVIS - 1-2 VIEW  Comparison: 08/27/2012  Findings: Stable appearance of resorbed left femoral head, proximal migration of the left femur, and scalloped, expanded,  and severely degenerated left acetabulum.  Degenerative findings of the right hip are present.  Lower lumbar spondylosis is present.  No pelvic fracture observed.  Atherosclerotic calcification noted in the pelvis.  IMPRESSION:  1.  Stable deformity of the left hip with resorption of the left femoral head and severe arthropathy involving the acetabulum.  No acute findings.   Original Report Authenticated By: Gaylyn Rong, M.D.   Ct Head Wo Contrast  03/10/2013   *RADIOLOGY REPORT*  Clinical Data:  The patient found down, fall from wheelchair. Abrasion along back of head.  CT HEAD WITHOUT CONTRAST CT CERVICAL SPINE WITHOUT CONTRAST  Technique:  Multidetector CT imaging of the head and cervical spine was performed following the standard protocol without intravenous contrast.  Multiplanar CT image reconstructions of the cervical spine were also generated.  Comparison:  01/25/2013  CT HEAD  Findings: Mild prominence of right to parietal vertex sulci, likely incidental.  The brain stem, cerebellum, cerebral peduncles, thalami, basal ganglia, basilar cisterns, and ventricular system appear unremarkable.  No intracranial hemorrhage, mass lesion, or acute infarction is identified.  IMPRESSION:  1.  No acute or significant intracranial findings.  CT CERVICAL SPINE  Findings: Stable 2.5 mm anterolisthesis at C4-5 with fusion of the facet joints at this level.  The facet joints are also fused on the right at C2-3.  Uncinate and facet spurring cause osseous foraminal stenosis on the right at C3-4 and on the left at C4-5 and C5-6.  Stable appearance of degenerative endplate sclerosis and Schmorl's nodes along the endplates at C5-6.  Bony projection from the expanded the transverse process on the left at C7 appears to pseudoarticulate with the left first rib - stable appearance to prior.  No cervical spine fracture or acute subluxation is identified.  IMPRESSION:  1.  Stable appearance the cervical spine with multilevel  spondylosis causing foraminal impingement.  No acute findings.   Original Report Authenticated By: Gaylyn Rong, M.D.   Ct Cervical Spine Wo Contrast  03/10/2013   *RADIOLOGY REPORT*  Clinical Data:  The patient found down, fall from wheelchair. Abrasion along back of head.  CT HEAD WITHOUT CONTRAST CT CERVICAL SPINE WITHOUT CONTRAST  Technique:  Multidetector CT imaging of the head and cervical spine was performed following the standard protocol without intravenous contrast.  Multiplanar CT image reconstructions of the cervical spine were also generated.  Comparison:  01/25/2013  CT HEAD  Findings: Mild prominence of right to parietal vertex sulci, likely incidental.  The brain stem, cerebellum, cerebral peduncles, thalami, basal ganglia, basilar cisterns, and ventricular system appear unremarkable.  No intracranial hemorrhage, mass lesion, or acute infarction is identified.  IMPRESSION:  1.  No acute or significant intracranial findings.  CT CERVICAL SPINE  Findings: Stable 2.5 mm anterolisthesis at C4-5 with fusion of the facet joints at this level.  The facet joints are also fused on the right at C2-3.  Uncinate and facet spurring cause osseous foraminal stenosis on the right at C3-4 and on the left at C4-5 and C5-6.  Stable appearance of degenerative endplate sclerosis and Schmorl's nodes along the endplates at C5-6.  Bony projection from the expanded the transverse process on the left at C7 appears to pseudoarticulate with the left first rib - stable appearance to prior.  No cervical spine fracture or acute subluxation is identified.  IMPRESSION:  1.  Stable appearance the cervical spine with multilevel spondylosis causing foraminal impingement.  No acute findings.   Original Report Authenticated By: Gaylyn Rong, M.D.   5 pm pt sleeping . Easily arousable answers questions appropriately MDM  Patient signed out to Dr. Karma Ganja at 4:50 PM  Diagnosis #1 fall #2 alcohol intoxication #3 suicidal  ideation #4 hypokalemia   Doug Sou, MD 03/10/13 1710  Doug Sou, MD 03/10/13 [phone]

## 2013-03-10 NOTE — ED Notes (Signed)
Pt's wheel chair has been labeled and placed in the ambulance bay.Marland Kitchen

## 2013-03-10 NOTE — ED Notes (Addendum)
Haldol was given by EMS, PTA.  Haldol 12m IM given then 5mg  IV. Versed 5mg  IM given by EMS as well.

## 2013-03-10 NOTE — ED Notes (Signed)
Pt moved to room 31. Pt yelling out and cursing at staff.

## 2013-03-10 NOTE — ED Notes (Signed)
Pt found in parking lot on ground after having fallen out of wheelchair.  EMS noted an abrasion to back of head.  Pt smells strongly of foul urine.  Pt is incontinent of urine and stool urinated on the EMS stretcher on way in.  Pt told EMS he wanted to die and wanted them to push him in front of a bus.  He complained of back pain.  BP 108/78, 85, 16, 97 NRB, CBG 95.

## 2013-03-10 NOTE — ED Notes (Signed)
WGN:FAOZ<HY> Expected date:<BR> Expected time:<BR> Means of arrival:<BR> Comments:<BR>

## 2013-03-10 NOTE — ED Provider Notes (Signed)
10:43 PM pt has been seen by ACT team.  He states he is no longer suicidal.  He has been seen numerous times in the ED for very similar complaints.  His primary complaint at this time is about his chronic leg pain and muscle spasms.  He has received his home meds- neurontin and zanaflex for this.  He has been seen multiple times by [facility], social work.  He was intoxicated on arrival, but he has chronic history of alcohol abuse and I do not feel he is a threat to himself or others at this time.    Ethelda Chick, MD 03/10/13 2245

## 2013-03-10 NOTE — ED Notes (Signed)
Ava with ACT aware of consult order, will be done on next shift

## 2013-03-11 NOTE — BH Assessment (Signed)
Assessment Note   Jeffrey [name] is an 57 y.o. male. Pt presents voluntarily to [facility] after he was found in parking lot after having fallen out of wheelchair. BAL was 236 upon arrival. At time of assessessment a few hrs. later, pt drowsy. He currently denies SI and HI. Pt states, "As old as I am, I would've kill myself by now if I wanted". He denies [facility] and no delusions noted. Pt reports he is a client of an ACT team in community but doesn't know agency name. He reports drinking alcohol daily and amount depends on what he can afford. He was at [facility] June 2014 for bipolar disorder and alcohol dependence and was at [facility] five times in 2010 for same. Pt to be d/c as he is not danger to himself or others. Pt requesting discharge.  Axis I: Alcohol Abuse, Bipolar I Disorder Axis II: Deferred Axis III:  Past Medical History  Diagnosis Date  . DDD (degenerative disc disease)   . Necrotizing fasciitis   . Degenerative disc disease   . Hypertension   . Degenerative disc disease   . Hepatitis C   . Chronic pain   . DDD (degenerative disc disease)   . Parkinson disease   . Mental disorder   . Depression   . DDD (degenerative disc disease)   . DDD (degenerative disc disease), lumbar    Axis IV: economic problems, housing problems, problems related to social environment and problems with primary support group Axis V: 41-50 serious symptoms  Past Medical History:  Past Medical History  Diagnosis Date  . DDD (degenerative disc disease)   . Necrotizing fasciitis   . Degenerative disc disease   . Hypertension   . Degenerative disc disease   . Hepatitis C   . Chronic pain   . DDD (degenerative disc disease)   . Parkinson disease   . Mental disorder   . Depression   . DDD (degenerative disc disease)   . DDD (degenerative disc disease), lumbar     Past Surgical History  Procedure Laterality Date  . Hip surgery    . Appendectomy    . Ankle surgery    . Incision and drainage of wound N/A  11/26/2012    Procedure: IRRIGATION AND DEBRIDEMENT WOUND;  Surgeon: Emelia Loron, MD;  Location: [facility] OR;  Service: General;  Laterality: N/A;  . Wound exploration N/A 11/26/2012    Procedure: WOUND EXPLORATION;  Surgeon: Emelia Loron, MD;  Location: [facility] OR;  Service: General;  Laterality: N/A;    Family History: No family history on file.  Social History:  reports that he has been smoking Cigarettes.  He has been smoking about 1.00 pack per day. His smokeless tobacco use includes Chew. He reports that he drinks about 1.5 ounces of alcohol per week. He reports that he does not use illicit drugs.  Additional Social History:  Alcohol / Drug Use Pain Medications: see PTA meds list Prescriptions: see PTA meds list Over the Counter: see PTA meds list History of alcohol / drug use?: Yes Substance #1 Name of Substance 1: alcohol 1 - Amount (size/oz): varies depending on amount he can afford 1 - Frequency: depends on what he can afford 1 - Duration: years 1 - Last Use / Amount: 03/10/13 - one fifth liquor  CIWA: CIWA-Ar BP: 148/71 mmHg Pulse Rate: 94 COWS:    Allergies: No Known Allergies  Home Medications:  (Not in a hospital admission)  OB/GYN Status:  No LMP for male  patient.  General Assessment Data Location of Assessment: [facility] ED Living Arrangements: Other (Comment) (homeless) Can pt return to current living arrangement?: Yes Admission Status: Voluntary Is patient capable of signing voluntary admission?: Yes Transfer from: Acute Hospital Referral Source: Self/Family/Friend  Education Status Is patient currently in school?: No  Risk to self Suicidal Ideation: No Suicidal Intent: No Is patient at risk for suicide?: No Suicidal Plan?: No Specify Current Suicidal Plan: none Access to Means: No What has been your use of drugs/alcohol within the last 12 months?: daily alcohol use Previous Attempts/Gestures: Yes How many times?: 1 Other Self Harm Risks: none Triggers  for Past Attempts: Family contact Intentional Self Injurious Behavior: None Family Suicide History: Unknown Recent stressful life event(s): Other (Comment) (homelessness, chronic pain) Persecutory voices/beliefs?: No Depression: Yes Depression Symptoms: Despondent Substance abuse history and/or treatment for substance abuse?: Yes Suicide prevention information given to non-admitted patients: Not applicable  Risk to Others Homicidal Ideation: No Thoughts of Harm to Others: No Current Homicidal Intent: No Current Homicidal Plan: No Access to Homicidal Means: No Identified Victim: none History of harm to others?: No Assessment of Violence: None Noted Violent Behavior Description: pt calm Does patient have access to weapons?: No Criminal Charges Pending?: No Does patient have a court date: No  Psychosis Hallucinations: None noted Delusions: None noted  Mental Status Report Appear/Hygiene: Disheveled Eye Contact: Fair Motor Activity: Freedom of movement Speech: Logical/coherent Level of Consciousness: Drowsy Mood: Depressed;Sad Affect: Appropriate to circumstance Anxiety Level: None Thought Processes: Relevant;Coherent Judgement: Unimpaired Orientation: Person;Place;Time;Situation Obsessive Compulsive Thoughts/Behaviors: None  Cognitive Functioning Concentration: Normal Memory: Recent Intact;Remote Intact IQ: Average Insight: Poor Impulse Control: Poor Appetite: Fair Weight Loss: 0 Weight Gain: 0 Sleep: No Change Total Hours of Sleep: 3 Vegetative Symptoms: None  ADLScreening [facility] Assessment Services) Patient's cognitive ability adequate to safely complete daily activities?: Yes Patient able to express need for assistance with ADLs?: Yes Independently performs ADLs?: No  Abuse/Neglect [facility]) Physical Abuse: Denies Verbal Abuse: Denies Sexual Abuse: Denies  Prior Inpatient Therapy Prior Inpatient Therapy: Yes Prior Therapy Dates: June 2014 / 2010 Prior  Therapy Facilty/Provider(s): [facility] Reason for Treatment: bipolar d/o, alcohol abuse, SI  Prior Outpatient Therapy Prior Outpatient Therapy: Yes Prior Therapy Dates: currently Prior Therapy Facilty/Provider(s): unknown ACT team Reason for Treatment: med management  ADL Screening (condition at time of admission) Patient's cognitive ability adequate to safely complete daily activities?: Yes Is the patient deaf or have difficulty hearing?: No Does the patient have difficulty seeing, even when wearing glasses/contacts?: No Does the patient have difficulty concentrating, remembering, or making decisions?: No Patient able to express need for assistance with ADLs?: Yes Does the patient have difficulty dressing or bathing?: Yes Independently performs ADLs?: No Communication: Independent Dressing (OT): Needs assistance Is this a change from baseline?: Pre-admission baseline Grooming: Needs assistance Is this a change from baseline?: Pre-admission baseline Feeding: Needs assistance Is this a change from baseline?: Pre-admission baseline Bathing: Dependent Is this a change from baseline?: Pre-admission baseline Toileting: Dependent Is this a change from baseline?: Pre-admission baseline In/Out Bed: Dependent Is this a change from baseline?: Pre-admission baseline Walks in Home: Dependent Is this a change from baseline?: Pre-admission baseline Does the patient have difficulty walking or climbing stairs?: Yes Weakness of Legs: Both Weakness of Arms/Hands: None  Home Assistive Devices/Equipment Home Assistive Devices/Equipment: Wheelchair    Abuse/Neglect Assessment (Assessment to be complete while patient is alone) Physical Abuse: Denies Verbal Abuse: Denies Sexual Abuse: Denies Exploitation of patient/patient's resources: Denies  Self-Neglect: Denies     Advance Directives (For Healthcare) Advance Directive: Patient does not have advance directive    Additional  Information 1:1 In Past 12 Months?: No CIRT Risk: No Elopement Risk: No Does patient have medical clearance?: Yes     Disposition:  Disposition Initial Assessment Completed for this Encounter: Yes Disposition of Patient: Other dispositions Other disposition(s): Information only  On Site Evaluation by:   Reviewed with Physician:     Emon Miggins P 03/11/2013 1:16 AM

## 2013-03-15 ENCOUNTER — Inpatient Hospital Stay (HOSPITAL_COMMUNITY)
Admission: EM | Admit: 2013-03-15 | Discharge: 2013-03-30 | DRG: 897 | Disposition: A | Discharge status: Skilled Nursing Facility | Payer: Medicaid Other | Attending: Family Medicine | Admitting: Family Medicine

## 2013-03-15 ENCOUNTER — Emergency Department (HOSPITAL_COMMUNITY): Payer: Medicaid Other

## 2013-03-15 ENCOUNTER — Encounter (HOSPITAL_COMMUNITY): Payer: Self-pay | Admitting: *Deleted

## 2013-03-15 DIAGNOSIS — G20A1 Parkinson's disease without dyskinesia, without mention of fluctuations: Secondary | ICD-10-CM | POA: Diagnosis present

## 2013-03-15 DIAGNOSIS — G8929 Other chronic pain: Secondary | ICD-10-CM

## 2013-03-15 DIAGNOSIS — Z5900 Homelessness unspecified: Secondary | ICD-10-CM

## 2013-03-15 DIAGNOSIS — F10239 Alcohol dependence with withdrawal, unspecified: Principal | ICD-10-CM

## 2013-03-15 DIAGNOSIS — F1020 Alcohol dependence, uncomplicated: Secondary | ICD-10-CM | POA: Diagnosis present

## 2013-03-15 DIAGNOSIS — A539 Syphilis, unspecified: Secondary | ICD-10-CM | POA: Diagnosis present

## 2013-03-15 DIAGNOSIS — A4901 Methicillin susceptible Staphylococcus aureus infection, unspecified site: Secondary | ICD-10-CM | POA: Diagnosis present

## 2013-03-15 DIAGNOSIS — G8220 Paraplegia, unspecified: Secondary | ICD-10-CM | POA: Diagnosis present

## 2013-03-15 DIAGNOSIS — T8130XA Disruption of wound, unspecified, initial encounter: Secondary | ICD-10-CM

## 2013-03-15 DIAGNOSIS — Z21 Asymptomatic human immunodeficiency virus [HIV] infection status: Secondary | ICD-10-CM | POA: Diagnosis present

## 2013-03-15 DIAGNOSIS — F322 Major depressive disorder, single episode, severe without psychotic features: Secondary | ICD-10-CM | POA: Diagnosis present

## 2013-03-15 DIAGNOSIS — H5316 Psychophysical visual disturbances: Secondary | ICD-10-CM | POA: Diagnosis present

## 2013-03-15 DIAGNOSIS — G20 Parkinson's disease: Secondary | ICD-10-CM | POA: Diagnosis present

## 2013-03-15 DIAGNOSIS — F10230 Alcohol dependence with withdrawal, uncomplicated: Secondary | ICD-10-CM

## 2013-03-15 DIAGNOSIS — Z91199 Patient's noncompliance with other medical treatment and regimen due to unspecified reason: Secondary | ICD-10-CM

## 2013-03-15 DIAGNOSIS — F1190 Opioid use, unspecified, uncomplicated: Secondary | ICD-10-CM

## 2013-03-15 DIAGNOSIS — J9600 Acute respiratory failure, unspecified whether with hypoxia or hypercapnia: Secondary | ICD-10-CM

## 2013-03-15 DIAGNOSIS — L8990 Pressure ulcer of unspecified site, unspecified stage: Secondary | ICD-10-CM

## 2013-03-15 DIAGNOSIS — E669 Obesity, unspecified: Secondary | ICD-10-CM

## 2013-03-15 DIAGNOSIS — F10939 Alcohol use, unspecified with withdrawal, unspecified: Principal | ICD-10-CM

## 2013-03-15 DIAGNOSIS — Z9119 Patient's noncompliance with other medical treatment and regimen: Secondary | ICD-10-CM

## 2013-03-15 DIAGNOSIS — F1910 Other psychoactive substance abuse, uncomplicated: Secondary | ICD-10-CM | POA: Diagnosis present

## 2013-03-15 DIAGNOSIS — F329 Major depressive disorder, single episode, unspecified: Secondary | ICD-10-CM

## 2013-03-15 DIAGNOSIS — D638 Anemia in other chronic diseases classified elsewhere: Secondary | ICD-10-CM

## 2013-03-15 DIAGNOSIS — F319 Bipolar disorder, unspecified: Secondary | ICD-10-CM

## 2013-03-15 DIAGNOSIS — F1010 Alcohol abuse, uncomplicated: Secondary | ICD-10-CM

## 2013-03-15 DIAGNOSIS — M5410 Radiculopathy, site unspecified: Secondary | ICD-10-CM

## 2013-03-15 DIAGNOSIS — B1920 Unspecified viral hepatitis C without hepatic coma: Secondary | ICD-10-CM | POA: Diagnosis present

## 2013-03-15 DIAGNOSIS — T8130XD Disruption of wound, unspecified, subsequent encounter: Secondary | ICD-10-CM

## 2013-03-15 DIAGNOSIS — F17200 Nicotine dependence, unspecified, uncomplicated: Secondary | ICD-10-CM | POA: Diagnosis present

## 2013-03-15 DIAGNOSIS — K7030 Alcoholic cirrhosis of liver without ascites: Secondary | ICD-10-CM | POA: Diagnosis present

## 2013-03-15 DIAGNOSIS — IMO0002 Reserved for concepts with insufficient information to code with codable children: Secondary | ICD-10-CM | POA: Diagnosis present

## 2013-03-15 DIAGNOSIS — L0291 Cutaneous abscess, unspecified: Secondary | ICD-10-CM | POA: Diagnosis present

## 2013-03-15 DIAGNOSIS — Z720 Tobacco use: Secondary | ICD-10-CM

## 2013-03-15 DIAGNOSIS — M5136 Other intervertebral disc degeneration, lumbar region: Secondary | ICD-10-CM

## 2013-03-15 DIAGNOSIS — A4902 Methicillin resistant Staphylococcus aureus infection, unspecified site: Secondary | ICD-10-CM | POA: Diagnosis present

## 2013-03-15 DIAGNOSIS — M48061 Spinal stenosis, lumbar region without neurogenic claudication: Secondary | ICD-10-CM

## 2013-03-15 DIAGNOSIS — L0390 Cellulitis, unspecified: Secondary | ICD-10-CM | POA: Diagnosis present

## 2013-03-15 DIAGNOSIS — I10 Essential (primary) hypertension: Secondary | ICD-10-CM

## 2013-03-15 DIAGNOSIS — M1710 Unilateral primary osteoarthritis, unspecified knee: Secondary | ICD-10-CM

## 2013-03-15 DIAGNOSIS — R45851 Suicidal ideations: Secondary | ICD-10-CM

## 2013-03-15 LAB — COMPREHENSIVE METABOLIC PANEL
Albumin: 3.3 g/dL — ABNORMAL LOW (ref 3.5–5.2)
BUN: 10 mg/dL (ref 6–23)
Calcium: 9.2 mg/dL (ref 8.4–10.5)
Creatinine, Ser: 0.59 mg/dL (ref 0.50–1.35)
Potassium: 3.7 mEq/L (ref 3.5–5.1)
Total Protein: 7.6 g/dL (ref 6.0–8.3)

## 2013-03-15 LAB — CBC WITH DIFFERENTIAL/PLATELET
Basophils Absolute: 0 10*3/uL (ref 0.0–0.1)
Basophils Relative: 1 % (ref 0–1)
Eosinophils Absolute: 0.1 10*3/uL (ref 0.0–0.7)
Eosinophils Relative: 2 % (ref 0–5)
HCT: 41.6 % (ref 39.0–52.0)
Hemoglobin: 13.6 g/dL (ref 13.0–17.0)
Lymphocytes Relative: 13 % (ref 12–46)
MCH: 27.5 pg (ref 26.0–34.0)
MCHC: 32.7 g/dL (ref 30.0–36.0)
Platelets: 147 10*3/uL — ABNORMAL LOW (ref 150–400)
RBC: 4.94 MIL/uL (ref 4.22–5.81)

## 2013-03-15 LAB — URINALYSIS, ROUTINE W REFLEX MICROSCOPIC
Bilirubin Urine: NEGATIVE
Nitrite: NEGATIVE
Specific Gravity, Urine: 1.015 (ref 1.005–1.030)
pH: 6 (ref 5.0–8.0)

## 2013-03-15 LAB — RAPID URINE DRUG SCREEN, HOSP PERFORMED
Cocaine: POSITIVE — AB
Opiates: NOT DETECTED

## 2013-03-15 LAB — CBC
Hemoglobin: 13.5 g/dL (ref 13.0–17.0)
MCH: 29.7 pg (ref 26.0–34.0)
MCHC: 35.2 g/dL (ref 30.0–36.0)
MCV: 84.4 fL (ref 78.0–100.0)
Platelets: 131 10*3/uL — ABNORMAL LOW (ref 150–400)
RBC: 4.55 MIL/uL (ref 4.22–5.81)

## 2013-03-15 LAB — URINE MICROSCOPIC-ADD ON

## 2013-03-15 LAB — ETHANOL: Alcohol, Ethyl (B): <11 mg/dL (ref 0–11)

## 2013-03-15 LAB — CREATININE, SERUM: Creatinine, Ser: 0.85 mg/dL (ref 0.50–1.35)

## 2013-03-15 LAB — VALPROIC ACID LEVEL: Valproic Acid Lvl: <10 ug/mL — ABNORMAL LOW (ref 50.0–100.0)

## 2013-03-15 LAB — PROTIME-INR: Prothrombin Time: 12.2 seconds (ref 11.6–15.2)

## 2013-03-15 LAB — CK: Total CK: 223 U/L (ref 7–232)

## 2013-03-15 LAB — TROPONIN I: Troponin I: <0.3 ng/mL (ref <0.30)

## 2013-03-15 MED ORDER — ONDANSETRON HCL 4 MG/2ML IJ SOLN
4.0000 mg | Freq: Once | INTRAMUSCULAR | Status: AC
  Administered 2013-03-15: 4 mg via INTRAVENOUS
  Filled 2013-03-15: qty 2

## 2013-03-15 MED ORDER — LORAZEPAM 1 MG PO TABS
1.0000 mg | ORAL_TABLET | Freq: Four times a day (QID) | ORAL | Status: AC | PRN
  Administered 2013-03-15 – 2013-03-18 (×8): 1 mg via ORAL
  Filled 2013-03-15 (×8): qty 1

## 2013-03-15 MED ORDER — THIAMINE HCL 100 MG/ML IJ SOLN
100.0000 mg | Freq: Every day | INTRAMUSCULAR | Status: DC
  Administered 2013-03-15: 100 mg via INTRAVENOUS
  Filled 2013-03-15: qty 2

## 2013-03-15 MED ORDER — LORAZEPAM 2 MG/ML IJ SOLN: 0.0000 mg | Freq: Two times a day (BID) | INTRAMUSCULAR | Status: DC

## 2013-03-15 MED ORDER — DOXYCYCLINE HYCLATE 100 MG PO TABS
100.0000 mg | ORAL_TABLET | Freq: Two times a day (BID) | ORAL | Status: AC
  Administered 2013-03-15 – 2013-03-20 (×12): 100 mg via ORAL
  Filled 2013-03-15 (×13): qty 1

## 2013-03-15 MED ORDER — ADULT MULTIVITAMIN W/MINERALS CH: 1.0000 | ORAL_TABLET | Freq: Every day | ORAL | Status: DC

## 2013-03-15 MED ORDER — GABAPENTIN 100 MG PO CAPS
100.0000 mg | ORAL_CAPSULE | Freq: Three times a day (TID) | ORAL | Status: DC
  Administered 2013-03-15 – 2013-03-21 (×18): 100 mg via ORAL
  Filled 2013-03-15 (×21): qty 1

## 2013-03-15 MED ORDER — SENNA 8.6 MG PO TABS
1.0000 | ORAL_TABLET | Freq: Two times a day (BID) | ORAL | Status: DC
  Administered 2013-03-15 – 2013-03-30 (×29): 8.6 mg via ORAL
  Filled 2013-03-15 (×33): qty 1

## 2013-03-15 MED ORDER — ADULT MULTIVITAMIN W/MINERALS CH
1.0000 | ORAL_TABLET | Freq: Every day | ORAL | Status: DC
  Administered 2013-03-15 – 2013-03-30 (×16): 1 via ORAL
  Filled 2013-03-15 (×16): qty 1

## 2013-03-15 MED ORDER — THIAMINE HCL 100 MG/ML IJ SOLN
100.0000 mg | Freq: Every day | INTRAMUSCULAR | Status: DC
  Filled 2013-03-15 (×7): qty 1

## 2013-03-15 MED ORDER — SODIUM CHLORIDE 0.9 % IV BOLUS (SEPSIS)
1000.0000 mL | Freq: Once | INTRAVENOUS | Status: AC
  Administered 2013-03-15: 1000 mL via INTRAVENOUS

## 2013-03-15 MED ORDER — MORPHINE SULFATE 4 MG/ML IJ SOLN
4.0000 mg | Freq: Once | INTRAMUSCULAR | Status: AC
  Administered 2013-03-15: 4 mg via INTRAVENOUS
  Filled 2013-03-15: qty 1

## 2013-03-15 MED ORDER — LORAZEPAM 2 MG/ML IJ SOLN: 1.0000 mg | Freq: Four times a day (QID) | INTRAMUSCULAR | Status: DC | PRN

## 2013-03-15 MED ORDER — ONDANSETRON HCL 4 MG/2ML IJ SOLN
4.0000 mg | Freq: Four times a day (QID) | INTRAMUSCULAR | Status: DC | PRN
  Administered 2013-03-16 (×2): 4 mg via INTRAVENOUS
  Filled 2013-03-15 (×2): qty 2

## 2013-03-15 MED ORDER — TRAZODONE HCL 50 MG PO TABS
50.0000 mg | ORAL_TABLET | Freq: Every evening | ORAL | Status: DC | PRN
  Administered 2013-03-15 – 2013-03-26 (×9): 50 mg via ORAL
  Filled 2013-03-15 (×11): qty 1

## 2013-03-15 MED ORDER — VITAMIN B-1 100 MG PO TABS
100.0000 mg | ORAL_TABLET | Freq: Every day | ORAL | Status: DC
  Administered 2013-03-15 – 2013-03-30 (×16): 100 mg via ORAL
  Filled 2013-03-15 (×16): qty 1

## 2013-03-15 MED ORDER — SODIUM CHLORIDE 0.9 % IJ SOLN
3.0000 mL | Freq: Two times a day (BID) | INTRAMUSCULAR | Status: DC
  Administered 2013-03-17 – 2013-03-29 (×5): 3 mL via INTRAVENOUS

## 2013-03-15 MED ORDER — ONDANSETRON HCL 4 MG PO TABS
4.0000 mg | ORAL_TABLET | Freq: Four times a day (QID) | ORAL | Status: DC | PRN
  Administered 2013-03-18 – 2013-03-20 (×2): 4 mg via ORAL
  Filled 2013-03-15 (×2): qty 1

## 2013-03-15 MED ORDER — POLYETHYLENE GLYCOL 3350 17 G PO PACK
17.0000 g | PACK | Freq: Every day | ORAL | Status: DC | PRN
  Filled 2013-03-15: qty 1

## 2013-03-15 MED ORDER — HYDROCODONE-ACETAMINOPHEN 5-325 MG PO TABS
1.0000 | ORAL_TABLET | ORAL | Status: DC | PRN
  Administered 2013-03-15: 2 via ORAL
  Administered 2013-03-15: 1 via ORAL
  Administered 2013-03-15 – 2013-03-16 (×6): 2 via ORAL
  Administered 2013-03-17 (×2): 1 via ORAL
  Administered 2013-03-17 – 2013-03-30 (×75): 2 via ORAL
  Filled 2013-03-15 (×50): qty 2
  Filled 2013-03-15: qty 1
  Filled 2013-03-15 (×36): qty 2

## 2013-03-15 MED ORDER — VITAMIN B-1 100 MG PO TABS: 100.0000 mg | ORAL_TABLET | Freq: Every day | ORAL | Status: DC

## 2013-03-15 MED ORDER — LORAZEPAM 1 MG PO TABS: 1.0000 mg | ORAL_TABLET | Freq: Four times a day (QID) | ORAL | Status: DC | PRN

## 2013-03-15 MED ORDER — ACETAMINOPHEN 650 MG RE SUPP: 650.0000 mg | Freq: Four times a day (QID) | RECTAL | Status: DC | PRN

## 2013-03-15 MED ORDER — COLLAGENASE 250 UNIT/GM EX OINT
TOPICAL_OINTMENT | Freq: Every day | CUTANEOUS | Status: DC
  Administered 2013-03-15 – 2013-03-19 (×5): via TOPICAL
  Filled 2013-03-15: qty 30

## 2013-03-15 MED ORDER — CHLORDIAZEPOXIDE HCL 5 MG PO CAPS
5.0000 mg | ORAL_CAPSULE | Freq: Three times a day (TID) | ORAL | Status: DC
  Administered 2013-03-15 – 2013-03-19 (×13): 5 mg via ORAL
  Filled 2013-03-15 (×13): qty 1

## 2013-03-15 MED ORDER — ACETAMINOPHEN 325 MG PO TABS
650.0000 mg | ORAL_TABLET | Freq: Four times a day (QID) | ORAL | Status: DC | PRN
  Filled 2013-03-15: qty 2

## 2013-03-15 MED ORDER — FOLIC ACID 1 MG PO TABS
1.0000 mg | ORAL_TABLET | Freq: Every day | ORAL | Status: DC
  Administered 2013-03-15 – 2013-03-30 (×16): 1 mg via ORAL
  Filled 2013-03-15 (×16): qty 1

## 2013-03-15 MED ORDER — HEPARIN SODIUM (PORCINE) 5000 UNIT/ML IJ SOLN
5000.0000 [IU] | Freq: Three times a day (TID) | INTRAMUSCULAR | Status: DC
  Administered 2013-03-15 – 2013-03-30 (×44): 5000 [IU] via SUBCUTANEOUS
  Filled 2013-03-15 (×50): qty 1

## 2013-03-15 MED ORDER — SODIUM CHLORIDE 0.9 % IV SOLN
INTRAVENOUS | Status: DC
  Administered 2013-03-15: 100 mL/h via INTRAVENOUS
  Administered 2013-03-16: 1000 mL via INTRAVENOUS
  Administered 2013-03-16: 07:00:00 via INTRAVENOUS
  Administered 2013-03-17: 100 mL/h via INTRAVENOUS
  Administered 2013-03-17 – 2013-03-20 (×6): via INTRAVENOUS

## 2013-03-15 MED ORDER — METOPROLOL SUCCINATE ER 25 MG PO TB24
25.0000 mg | ORAL_TABLET | Freq: Every day | ORAL | Status: DC
  Filled 2013-03-15: qty 1

## 2013-03-15 MED ORDER — TIZANIDINE HCL 4 MG PO TABS
4.0000 mg | ORAL_TABLET | Freq: Three times a day (TID) | ORAL | Status: DC | PRN
  Administered 2013-03-16 – 2013-03-29 (×7): 4 mg via ORAL
  Filled 2013-03-15 (×8): qty 1

## 2013-03-15 MED ORDER — FOLIC ACID 1 MG PO TABS: 1.0000 mg | ORAL_TABLET | Freq: Every day | ORAL | Status: DC

## 2013-03-15 MED ORDER — NICOTINE 21 MG/24HR TD PT24
21.0000 mg | MEDICATED_PATCH | Freq: Every day | TRANSDERMAL | Status: DC
  Administered 2013-03-16 – 2013-03-30 (×15): 21 mg via TRANSDERMAL
  Filled 2013-03-15 (×15): qty 1

## 2013-03-15 MED ORDER — DIVALPROEX SODIUM 500 MG PO DR TAB
500.0000 mg | DELAYED_RELEASE_TABLET | Freq: Two times a day (BID) | ORAL | Status: DC
  Administered 2013-03-15 – 2013-03-30 (×31): 500 mg via ORAL
  Filled 2013-03-15 (×33): qty 1

## 2013-03-15 MED ORDER — LORAZEPAM 2 MG/ML IJ SOLN
1.0000 mg | Freq: Four times a day (QID) | INTRAMUSCULAR | Status: AC | PRN
  Administered 2013-03-16: 1 mg via INTRAVENOUS
  Filled 2013-03-15: qty 1

## 2013-03-15 MED ORDER — LORAZEPAM 2 MG/ML IJ SOLN
0.0000 mg | Freq: Four times a day (QID) | INTRAMUSCULAR | Status: DC
  Administered 2013-03-15: 2 mg via INTRAVENOUS
  Filled 2013-03-15: qty 1

## 2013-03-15 MED ORDER — NICOTINE 21 MG/24HR TD PT24
21.0000 mg | MEDICATED_PATCH | Freq: Once | TRANSDERMAL | Status: DC
  Administered 2013-03-15: 21 mg via TRANSDERMAL
  Filled 2013-03-15: qty 1

## 2013-03-15 MED ORDER — PANTOPRAZOLE SODIUM 40 MG IV SOLR
40.0000 mg | Freq: Once | INTRAVENOUS | Status: AC
  Administered 2013-03-15: 40 mg via INTRAVENOUS
  Filled 2013-03-15: qty 40

## 2013-03-15 MED ORDER — AMLODIPINE BESYLATE 5 MG PO TABS
5.0000 mg | ORAL_TABLET | Freq: Every day | ORAL | Status: DC
  Administered 2013-03-15 – 2013-03-30 (×15): 5 mg via ORAL
  Filled 2013-03-15 (×16): qty 1

## 2013-03-15 MED ORDER — ONDANSETRON HCL 4 MG/2ML IJ SOLN: 4.0000 mg | Freq: Three times a day (TID) | INTRAMUSCULAR | Status: DC | PRN

## 2013-03-15 NOTE — ED Notes (Signed)
Report given to Avala, Charity fundraiser. Family Practice at bedside seeing pt.

## 2013-03-15 NOTE — Progress Notes (Signed)
Dressings applied to back and left leg. Pt states is very sensitive to tape, paper tape used per pt request. Moderate amount of purulent drainage noted to left thigh and left shin. Will continue to monitor.

## 2013-03-15 NOTE — Progress Notes (Signed)
Pt is alert and oriented, complaining of pain 9/10 to lower back and right hip. Pt endorses he has "probably" attempted suicide within the last 30 days (unclear methods of attempt/time/date). Admits to recent [facility] admission, states he is "supposed to be bipolar." When questioning pt, he does not admit or deny SI, only states "he just wants help." Consulting civil engineer and MD aware. Pt wheelchair in ED, per ED NT plan is to decontaminate wheelchair and will then return it to the pt. Will continue to monitor.    SKIN ASSESSMENT: Pt has multiple open sores/scabs to arms/legs, no drainage noted. Pt has scar with yellow scab to upper back measuring 6 cm x 1 cm x 0.1 cm with no drainage noted, scar to Left hip with area of yellow scab/redness/dry skin measuring 9 cm x 2.5 cm x 0.1 cm with no drainage noted, open sore to left lateral thigh (marked area measuring 10 cm x 9 cm x 0.2 cm) red/inflamed, painful to touch, wound bed yellow/red with some minimal serosanguinous drainage noted, open sore to left medial thigh (marked 3.5 x 3.5 x 0.2) with wound bed yellow/red with minimal serosanguinous drainage noted, open sore to left lateral shin measuring 8 cm x 4.5 cm x 0.2 cm with scant bloody drainage noted. Pt has closed, brown colored blisters to tips of left 2nd (0.5 x 0.5), 4th (1x1), and 5th toes (0.5x0.5). Pt admits to frequent falls, states last drink was 1/2 gallon of vodka last night at approx 10pm.

## 2013-03-15 NOTE — Progress Notes (Signed)
Interim progress note:  Pt seen and evaluated along with Dr. Clinton Sawyer.  Currently stable. Currently transitioning to new bed.  Pt not tremulous at this time, but given scheduled librium 5mg  for concern of chronic benzodiazepine use.  Will titrate dose as necessary.    Ryan B. Jarvis Newcomer, MD PGY-1, [facility] 03/15/2013 6:35 PM

## 2013-03-15 NOTE — Progress Notes (Signed)
Called x1 to get report, per NS RN is currently unavailable. Left number for call back. Charge RN aware.

## 2013-03-15 NOTE — ED Notes (Signed)
Patient transported to X-ray 
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
## 2013-03-15 NOTE — Progress Notes (Signed)
Received report from ED RN, Matthew Folks.

## 2013-03-15 NOTE — H&P (Signed)
FMTS Attending Admission Note: Jeffrey Muscato,MD I  have seen and examined this patient, reviewed their chart. I have discussed this patient with the resident. I agree with the resident's findings, assessment and care plan.  57 Y/O M with Pmx paraplegia,polysubstance abuse,alcohilism,HTN,bipolar present to the hospital with hx of tremors which started this morning,he had alcohol drink yesterday which includes beer,liquor. He also endorsed used of illicit substance,he denies recent fall or LOC,no headache,no change in his mental status. Currently patient feels better his tremors has improved.  O/E:  Gen: Lying on his side,comfortably in bed,not in distress. Neuro: Alert oriented x 3. Mild hand tremors, DTR intact,paraplegic. Resp: Air entry equal b/l,no wheezing or crepitations. CV: S1 S2 normal,no murmurs. Ext: LL weakness (Paraplegic),no edema.  Skin:Multiple,small circular sores on his hands b/l,also a large stage 2-3 ulcer on his left hip and gluteus,mostly on the lateral surface measuring about 4cm by 5cm. Wound looked clean.  A/P: 57 y/o M with 1. Tremors likely secondary to ETOH withdrawal.     Lab reviewed,positive Utox for cocaine,benzo,marijuana.     Monitor on telemetry.     CIWA protocol,agree with adding Librium to CIWA.     Agree with MVI,Thiamine and folate.    Fall precaution and neuro check.  2. Skin wound:      Although wound looked clean after irrigation at the ED,obtaining wound culture might be beneficial.     I agree with wound care consult and A/B coverage.  3. HTN: On Norvasc,to add Hydralazine prn.  Please read resident's note for more detailed information.

## 2013-03-15 NOTE — ED Notes (Signed)
Pt reports pain to lower back, chronic. Pt reports going in to DT's. Drank 1/2 gallon of vodka yesterday morning. Pt alert and oriented x 4. Pt has multiple wounds across body. Wheelchair bound, EMS states wheelchair covered in urine.

## 2013-03-15 NOTE — ED Provider Notes (Signed)
History    CSN: [id number] Arrival date & time 03/15/13  0707  First MD Initiated Contact with Patient 03/15/13 [phone]     Chief Complaint  Patient presents with  . Withdrawal  . Wound Infection   (Consider location/radiation/quality/duration/timing/severity/associated sxs/prior Treatment) HPI Comments: Patient presents via EMS complaining of alcohol withdrawal. He states he is homeless and wheelchair-bound. He is to outside last night and is not administering drinking 3 days. He states he had some vodka yesterday. He feels shaky and thinks he had a seizure this morning. No previous history of seizures. He said he had some vomiting with specks of red he thought was blood. Denies any injection drug use but does do cocaine use yesterday. Denies any chest pain, shortness of breath, fevers or chills. He complains of chronic back and abdominal pain.  The history is provided by the EMS personnel and the patient.   Past Medical History  Diagnosis Date  . DDD (degenerative disc disease)   . Necrotizing fasciitis   . Degenerative disc disease   . Hypertension   . Degenerative disc disease   . Hepatitis C   . Chronic pain   . DDD (degenerative disc disease)   . Parkinson disease   . Mental disorder   . Depression   . DDD (degenerative disc disease)   . DDD (degenerative disc disease), lumbar    Past Surgical History  Procedure Laterality Date  . Hip surgery    . Appendectomy    . Ankle surgery    . Incision and drainage of wound N/A 11/26/2012    Procedure: IRRIGATION AND DEBRIDEMENT WOUND;  Surgeon: Emelia Loron, MD;  Location: [facility] OR;  Service: General;  Laterality: N/A;  . Wound exploration N/A 11/26/2012    Procedure: WOUND EXPLORATION;  Surgeon: Emelia Loron, MD;  Location: [facility] OR;  Service: General;  Laterality: N/A;   No family history on file. History  Substance Use Topics  . Smoking status: Current Every Day Smoker -- 1.00 packs/day    Types: Cigarettes    Last  Attempt to Quit: 11/27/2012  . Smokeless tobacco: Current User    Types: Chew  . Alcohol Use: 1.5 oz/week     Comment: anything he can get his hands on    Review of Systems  Constitutional: Positive for activity change, appetite change and fatigue. Negative for fever.  HENT: Negative for congestion and rhinorrhea.   Eyes: Negative for visual disturbance.  Respiratory: Negative for cough, chest tightness and shortness of breath.   Cardiovascular: Negative for chest pain.  Gastrointestinal: Positive for nausea, vomiting and abdominal pain.  Genitourinary: Negative for flank pain and testicular pain.  Musculoskeletal: Negative for back pain.  Skin: Negative for rash.  Neurological: Negative for dizziness, weakness and headaches.  A complete 10 system review of systems was obtained and all systems are negative except as noted in the HPI and PMH.    Allergies  Review of patient's allergies indicates no known allergies.  Home Medications   No current outpatient prescriptions on file. BP 164/84  Pulse 84  Temp(Src) 97.8 F (36.6 C) (Oral)  Resp 18  Ht 6\' 2"  (1.88 m)  Wt 225 lb 12 oz (102.4 kg)  BMI 28.97 kg/m2  SpO2 99% Physical Exam  Constitutional: He is oriented to person, place, and time. He appears well-developed and well-nourished. No distress.  Mildly tremulous disheveled  HENT:  Head: Normocephalic and atraumatic.  Mouth/Throat: Oropharynx is clear and moist. No oropharyngeal exudate.  Eyes:  Conjunctivae and EOM are normal. Pupils are equal, round, and reactive to light.  Neck: Normal range of motion. Neck supple.  Cardiovascular: Regular rhythm and normal heart sounds.   Pulmonary/Chest: Effort normal and breath sounds normal. No respiratory distress.  Abdominal: Soft. There is tenderness. There is no rebound and no guarding.  Tender hepatomegaly.  Musculoskeletal: Normal range of motion. He exhibits no edema and no tenderness.  Neurological: He is alert and  oriented to person, place, and time. No cranial nerve deficit. He exhibits normal muscle tone. Coordination normal.  asterixis  Skin: Skin is warm.  Abrasion to left lateral tibia, left lateral hip, healed surgical incision to left hip numerous skin abrasions of arms and legs. Erythematous and indurated L thigh wound.       ED Course  Procedures (including critical care time) Labs Reviewed  CBC WITH DIFFERENTIAL - Abnormal; Notable for the following:    RDW 17.0 (*)    Platelets 147 (*)    Monocytes Relative 13 (*)    All other components within normal limits  COMPREHENSIVE METABOLIC PANEL - Abnormal; Notable for the following:    Glucose, Bld 106 (*)    Albumin 3.3 (*)    Alkaline Phosphatase 164 (*)    All other components within normal limits  URINE RAPID DRUG SCREEN (HOSP PERFORMED) - Abnormal; Notable for the following:    Cocaine POSITIVE (*)    Benzodiazepines POSITIVE (*)    Tetrahydrocannabinol POSITIVE (*)    Barbiturates POSITIVE (*)    All other components within normal limits  URINALYSIS, ROUTINE W REFLEX MICROSCOPIC - Abnormal; Notable for the following:    Hgb urine dipstick MODERATE (*)    Protein, ur 100 (*)    All other components within normal limits  VALPROIC ACID LEVEL - Abnormal; Notable for the following:    Valproic Acid Lvl <10.0 (*)    All other components within normal limits  WOUND CULTURE  PROTIME-INR  ETHANOL  CK  TROPONIN I  URINE MICROSCOPIC-ADD ON  AMMONIA  CBC  CREATININE, SERUM   Dg Chest 1 View  03/15/2013   *RADIOLOGY REPORT*  Clinical Data: Weakness.  CHEST - 1 VIEW  Comparison: 01/29/2013  Findings: Single view of the chest was obtained.  The lungs are clear. The anterior aspect of the first ribs are prominent but unchanged.  Heart and mediastinum are within normal limits.  The trachea is midline.  IMPRESSION: No acute cardiopulmonary disease.   Original Report Authenticated By: Richarda Overlie, M.D.   Ct Head Wo Contrast  03/15/2013    *RADIOLOGY REPORT*  Clinical Data: Diffuse headache  CT HEAD WITHOUT CONTRAST  Technique:  Contiguous axial images were obtained from the base of the skull through the vertex without contrast.  Comparison: 03/10/2013  Findings: Mild generalized atrophy. Normal ventricular morphology. No midline shift or mass effect. Otherwise normal appearance of brain parenchyma. No intracranial hemorrhage, mass lesion, or acute infarction. Visualized paranasal sinuses clear. Bones unremarkable.  IMPRESSION: No acute intracranial abnormalities.   Original Report Authenticated By: Ulyses Southward, M.D.   1. Alcohol withdrawal, uncomplicated   2. Bipolar disorder   3. Essential hypertension, benign   4. ETOH abuse   5. Homelessness   6. Obesity, unspecified     MDM  POssible early alcohol withdrawal.  Patient hypertensive and tremulous.  No fever or hallucinations.  NOt taking bipolar meds.  Denies SI or HI.  Various skin lesions and scabs in various stages of healing.  Possible cellulitis  to LLE.  Patient hypertensive and tremulous. Concern for early alcohol withdrawal. CIWA score of 8.    Date: 03/15/2013  Rate: 85  Rhythm: normal sinus rhythm  QRS Axis: right  Intervals: normal  ST/T Wave abnormalities: normal  Conduction Disutrbances:none  Narrative Interpretation:   Old EKG Reviewed: unchanged       Glynn Octave, MD 03/15/13 1511

## 2013-03-15 NOTE — Progress Notes (Signed)
Pt arrived to unit accompanied by NT on ED stretcher.
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
## 2013-03-15 NOTE — Progress Notes (Signed)
PT Cancellation Note  Patient Details Name: Jeffrey [name] MRN: [id number] DOB: 1956/03/29   Cancelled Treatment:    Reason Eval/Treat Not Completed: Medical issues which prohibited therapy Pt on 12 hrs of bedrest.  Will check back tomorrow for mobility.  Also received hydrotherapy order and per RN okay to start tomorrow.   Kahil Agner,[name] 03/15/2013, 3:26 PM Zenovia Jarred, PT, DPT 03/15/2013 Pager: [phone]
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
## 2013-03-16 LAB — COMPREHENSIVE METABOLIC PANEL
AST: 17 U/L (ref 0–37)
Albumin: 2.6 g/dL — ABNORMAL LOW (ref 3.5–5.2)
BUN: 9 mg/dL (ref 6–23)
Chloride: 105 mEq/L (ref 96–112)
Creatinine, Ser: 0.58 mg/dL (ref 0.50–1.35)
Total Protein: 6.3 g/dL (ref 6.0–8.3)

## 2013-03-16 LAB — MAGNESIUM: Magnesium: 1.8 mg/dL (ref 1.5–2.5)

## 2013-03-16 MED ORDER — HYDROCODONE-ACETAMINOPHEN 5-325 MG PO TABS
1.0000 | ORAL_TABLET | Freq: Once | ORAL | Status: AC
  Administered 2013-03-16: 1 via ORAL

## 2013-03-16 MED ORDER — POTASSIUM CHLORIDE CRYS ER 20 MEQ PO TBCR
40.0000 meq | EXTENDED_RELEASE_TABLET | Freq: Once | ORAL | Status: AC
  Administered 2013-03-16: 40 meq via ORAL
  Filled 2013-03-16: qty 2
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
## 2013-03-16 NOTE — Progress Notes (Signed)
Physical Therapy Wound Treatment Patient Details  Name: Jeffrey [name] MRN: [id number] Date of Birth: Jan 13, 1956  Today's Date: 03/16/2013 Time: 4098-1191 Time Calculation (min): 49 min  Subjective  Subjective: Pt reports his doctor went on vacation and he had to get EMS to change his wound dressings.  Patient and Family Stated Goals: Save my leg Date of Onset:  (unknown; chronic wounds) Prior Treatments: gauze dressings  Pain Score: Pain Score: 8   Wound Assessment  Wound 03/15/13 Abrasion(s) Thigh Left;Lateral;Posterior open sore with purulent drainage (Active)  Site / Wound Assessment Granulation tissue;Yellow;Pale;Pink 03/16/2013  9:48 AM  % Wound base Red or Granulating 20% 03/16/2013  9:48 AM  % Wound base Yellow 80% 03/16/2013  9:48 AM  % Wound base Black 0% 03/16/2013  9:48 AM  % Wound base Other (Comment) 0% 03/16/2013  9:48 AM  Peri-wound Assessment Edema;Erythema (non-blanchable);Intact 03/16/2013  9:48 AM  Wound Length (cm) 6.1 cm 03/16/2013  9:48 AM  Wound Width (cm) 5.8 cm 03/16/2013  9:48 AM  Wound Depth (cm) 0.3 cm 03/16/2013  9:48 AM  Margins Unattacted edges (unapproximated) 03/16/2013  9:48 AM  Closure None 03/16/2013  9:48 AM  Drainage Amount Minimal 03/16/2013  9:48 AM  Drainage Description Purulent;No odor 03/16/2013  9:48 AM  Non-staged Wound Description Partial thickness 03/16/2013  9:48 AM  Treatment Hydrotherapy (Ultrasonic mist);Packing (Saline gauze);Tape changed 03/16/2013  9:48 AM  Dressing Type Barrier Film (skin prep);Gauze (Comment);Moist to moist;Paper tape;Other (Comment) 03/16/2013  9:48 AM  Dressing Changed Changed 03/16/2013  9:48 AM  Dressing Status Clean;Dry;Intact 03/16/2013  9:48 AM     Wound 03/15/13 Abrasion(s) Leg Left;Lateral;Lower open sore with purulent drainage (Active)  Site / Wound Assessment Red;Yellow 03/16/2013  9:48 AM  % Wound base Red or Granulating 95% 03/16/2013  9:48 AM  % Wound base Yellow 5% 03/16/2013  9:48 AM  % Wound base Black 0%  03/16/2013  9:48 AM  % Wound base Other (Comment) 0% 03/16/2013  9:48 AM  Peri-wound Assessment Intact;Erythema (blanchable) 03/16/2013  9:48 AM  Wound Length (cm) 4.1 cm 03/16/2013  9:48 AM  Wound Width (cm) 3.8 cm 03/16/2013  9:48 AM  Wound Depth (cm) 0.1 cm 03/16/2013  9:48 AM  Margins Unattacted edges (unapproximated) 03/16/2013  9:48 AM  Closure None 03/16/2013  9:48 AM  Drainage Amount Minimal 03/16/2013  9:48 AM  Drainage Description Purulent;No odor 03/16/2013  9:48 AM  Non-staged Wound Description Partial thickness 03/16/2013  9:48 AM  Treatment Hydrotherapy (Ultrasonic mist);Packing (Saline gauze);Tape changed 03/16/2013  9:48 AM  Dressing Type Barrier Film (skin prep);Gauze (Comment);Moist to moist;Paper tape 03/16/2013  9:48 AM  Dressing Changed Changed 03/16/2013  9:48 AM  Dressing Status Clean;Intact;Dry 03/16/2013  9:48 AM      Hydrotherapy Ultrasonic mist  - wound location: Left lateral knee;Left posterior thigh Ultrasonic mist at 35KHz (+/-3KHz) at ___ percent: 100 % Ultrasonic mist therapy minutes: 8 min   Wound Assessment and Plan  Wound Therapy - Assess/Plan/Recommendations Wound Therapy - Clinical Statement: Pt adm with multiple chronic wounds which he described as bug bites that he "picked" and they incr in size. Both wounds with visible granulation tissue and thin layer of yellow necrotic tissue. Wounds are amenable to ultrasonic hydrotherapy to assist with debridement and decreasing bacterial burden to promote healing. Wound Therapy - Functional Problem List: pt at incr risk of infection due to open wounds; sitting tolerance limited due to open wound on posterior thigh (pt sits in his wheelchair 16 hrs/day per his report)  Factors Delaying/Impairing Wound Healing: Altered sensation;Infection - systemic/local;Immobility;Multiple medical problems;Substance abuse Hydrotherapy Plan: Debridement;Dressing change;Patient/family education;Ultrasonic wound therapy @35  KHz (+/- 3 KHz) Wound  Therapy - Frequency: 6X / week (anticipate decr to 3x/week vs d/c next week) Wound Therapy - Follow Up Recommendations: Other (comment) (will depend on medical needs as these wounds are chronic)  Wound Therapy Goals- Improve the function of patient's integumentary system by progressing the wound(s) through the phases of wound healing (inflammation - proliferation - remodeling) by: Decrease Necrotic Tissue to: 10% Lt posterior thigh Decrease Necrotic Tissue - Progress: Goal set today Increase Granulation Tissue to: 90 Increase Granulation Tissue - Progress: Goal set today Decrease Length/Width/Depth by (cm): 0.3 Decrease Length/Width/Depth - Progress: Goal set today Improve Drainage Characteristics: Serous Improve Drainage Characteristics - Progress: Goal set today Patient/Family will be able to : verbalize plan for wound care upon discharge Patient/Family Instruction Goal - Progress: Goal set today Goals/treatment plan/discharge plan were made with and agreed upon by patient/family: Yes Time For Goal Achievement: 7 days Wound Therapy - Potential for Goals: Good  Goals will be updated until maximal potential achieved or discharge criteria met.  Discharge criteria: when goals achieved, discharge from hospital, MD decision/surgical intervention, no progress towards goals, refusal/missing three consecutive treatments without notification or medical reason.  GP     Felesia Stahlecker 03/16/2013, 10:05 AM Pager [phone]

## 2013-03-16 NOTE — Progress Notes (Signed)
FMTS Attending Admission Note: Aren Pryde,MD I  have seen and examined this patient, reviewed their chart. I have discussed this patient with the resident. I agree with the resident's findings, assessment and care plan.

## 2013-03-16 NOTE — Progress Notes (Signed)
Family Medicine Teaching Service Daily Progress Note Intern Pager: [phone]  Patient name: Jeffrey [name] Medical record number: [id number] Date of birth: 1955-10-29 Age: 57 y.o. Gender: male  Primary Care Provider: Marena Chancy, MD Consultants: WOC, SW, PT/OT Code Status: DNR/DNI  Pt Overview and Major Events to Date:  - continued CIWA protocol  Assessment and Plan: Jeffrey [name] is a 57 y.o. year old homeless male w/ h/o Hep C, Etoh abuse w/ cirrhosis, polysubstance abuse, HTN, bipolar, paraplegia, presenting with tremors and soft tissue infection.   # Substance Abuse: Chronic condition for pt. Cocaine, THC, Benzo positive on admission. Etoh level negative at time of admission. Pt endorses significant Etoh use ~24 hrs ago. Ativan, Thiamine and folate given in ED. Likely not actively going though withdrawal at this time though still possible if Etoh intake yesterday is significantly reduced from previous intake. Neurologically intact w/ slight hand tremor  - Admit to tele for inpt detox, Attending Dr. Lum Babe  - Etoh abuse counseling  - CIWA (Ativan PRN)  - Consider librium for longer effect given long half life  - Nicotine patch (21mg )  - Ammonia level  - Home Depakote restarted  - Home gabapentin restarted at reduced dose  - scheduled thiamine, folate and multivitamin - ammonia: 18 - continuing CIWA (Ativan 1mg  prn) - OT recommends SNF for continued OT services - PT recommends SNF/Home health PT; wheelchair cushion [ ]  f/u social work  # Skin Wounds/Infection: numerous open skin wounds, 2 of which appear infected and possibly cellulitic. All in various stages of healing. Pt endorses picking of mosquito bites on upper and lower extremities. LE extremity wound borders demarcated w/ skin marking pen. No signs of systemic illness. Recent Rx for Keflex on 02/27/13 was not filled.  - Doxy for cellulitis and presumed MRSA.  - wound care for thigh lesions  - nursing to bandage various  arm lesions to help deter pt from picking  - air overlay matress  - wound culture   # CV: h/o HTN and positive for cocaine. Hypertensive on admission. No current signs of MI  - Tele  - Change home Metop to amlodipine given h/o cocaine  - Hydralazine PRN   # ID: no signs of sepsis, but w/ h/o Hep C and syphilis and active drug abuser. Cellulitis as above  - ABX as above  - HIV   # Psych: h/o depression and bipolar. Not taking any of his home medications. SI about 1 month ago but denies current SI/HI.  - Will wait to restart cymbalta at time of DC  - Restart Depakote.  - Home trazadone PRN QHS ordered   # MSK: paraplegic w/ muscle spasticity. Not taking home medications. Source of chronic pain for pt.  - PT/OT  - Start home Zanaflex   # FEN/GI: Tolerating PO but w/ stomach pain likely from withdrawal. 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  - IVF of NS 1108ml/hr (taking PO so no banana bag)  - Regular diet  - Zofran PRN  - Senna and miralax  Prophylaxis: Hep SQ TID  Disposition: pending possible placement at detox center  Subjective: Has slight nausea and having visual hallucinations.  Objective: Temp:  [97.8 F (36.6 C)-98.2 F (36.8 C)] 97.9 F (36.6 C) (07/11 0826) Pulse Rate:  [63-88] 73 (07/11 1400) Resp:  [18-20] 20 (07/11 0826) BP: (116-164)/(72-98) 116/72 mmHg (07/11 1400) SpO2:  [95 %-99 %] 99 % (07/11 0826)  Physical Exam: General: in bed, comfortable, in no acute distress Extremities: large lesionon left leg being cleaned by wound care Neuro: alert & oriented x3. No focal defects. No resting tremor. Left foot was twitching, but patient said he consiously does that because of arthritis - no twitching when asked to stop.   Laboratory:  Recent Labs Lab 03/10/13 1647 03/15/13 0741 03/15/13 1731  WBC 9.0 6.0 6.8  HGB 14.1 13.6 13.5  HCT 41.6 41.6 38.4*  PLT 135* 147* 131*    Recent Labs Lab  03/10/13 1605 03/15/13 0741 03/15/13 1731 03/16/13 0625  NA 139 141  --  140  K 3.2* 3.7  --  3.1*  CL 102 104  --  105  CO2 19 21  --  23  BUN 8 10  --  9  CREATININE 0.52 0.59 0.85 0.58  CALCIUM 8.8 9.2  --  8.3*  PROT 7.5 7.6  --  6.3  BILITOT 0.2* 0.4  --  0.3  ALKPHOS 157* 164*  --  137*  ALT 9 8  --  7  AST 27 24  --  17  GLUCOSE 93 106*  --  94    Alk phos: 137 Albumin: 2.6 AST: 17 ALT: 7 Ammonia: 18   Jacquelin Hawking, MD 03/16/2013, 2:33 PM PGY-1, [facility] Family Medicine FPTS Intern pager: [phone], text pages welcome

## 2013-03-16 NOTE — Progress Notes (Signed)
Patient positive for visual hallucinations, severe headache, and tremors. Pt states he keeps "hallucinating" and seeing alcohol bottles everywhere. Denies suicidal and homicidal ideation at this time. Will continue to monitor.
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
## 2013-03-17 DIAGNOSIS — M79609 Pain in unspecified limb: Secondary | ICD-10-CM

## 2013-03-17 DIAGNOSIS — K7030 Alcoholic cirrhosis of liver without ascites: Secondary | ICD-10-CM

## 2013-03-17 DIAGNOSIS — F17200 Nicotine dependence, unspecified, uncomplicated: Secondary | ICD-10-CM

## 2013-03-17 DIAGNOSIS — IMO0002 Reserved for concepts with insufficient information to code with codable children: Secondary | ICD-10-CM

## 2013-03-17 DIAGNOSIS — G8929 Other chronic pain: Secondary | ICD-10-CM

## 2013-03-17 DIAGNOSIS — Z5189 Encounter for other specified aftercare: Secondary | ICD-10-CM

## 2013-03-17 MED ORDER — HYDRALAZINE HCL 20 MG/ML IJ SOLN: 5.0000 mg | INTRAMUSCULAR | Status: DC | PRN

## 2013-03-17 MED ORDER — OXYCODONE HCL 5 MG PO TABS
5.0000 mg | ORAL_TABLET | Freq: Once | ORAL | Status: AC
  Administered 2013-03-17: 5 mg via ORAL
  Filled 2013-03-17: qty 1

## 2013-03-17 NOTE — Progress Notes (Signed)
FMTS Attending Admission Note: Jafeth Mustin,MD I  have seen and examined this patient, reviewed their chart. I have discussed this patient with the resident. I agree with the resident's findings, assessment and care plan.  
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
## 2013-03-17 NOTE — Progress Notes (Signed)
Family Medicine Teaching Service Daily Progress Note Intern Pager: [phone]  Patient name: Jeffrey [name] Medical record number: [id number] Date of birth: 05-01-1956 Age: 57 y.o. Gender: male  Primary Care Provider: No primary provider on file. Consultants: WOC, SW, PT/OT Code Status: DNR/DNI  Pt Overview and Major Events to Date:  - continued CIWA protocol  Assessment and Plan: Jeffrey [name] is a 57 y.o. year old homeless male w/ h/o Hep C, Etoh abuse w/ cirrhosis, polysubstance abuse, HTN, bipolar, paraplegia, presenting with tremors and soft tissue infection.   # Substance Abuse: H/o polysubstance abuse with UDS positive for cocaine, THC, benzo on admission.  Last alcohol about 24 hours prior to admission; EtOH negative on admission.   - CIWA protocol  - librium 5mg  TID - Etoh abuse counseling  - Nicotine patch (21mg )  - scheduled thiamine, folate and multivitamin [ ]  f/u social work for placement  # Skin Wounds/Infection: numerous open skin wounds, 2 of which appear infected and possibly cellulitic. All in various stages of healing. Pt endorses picking of mosquito bites on upper and lower extremities. LE extremity wound borders demarcated w/ skin marking pen. No signs of systemic illness. Recent Rx for Keflex on 02/27/13 was not filled.  - Doxy for cellulitis and presumed MRSA.  - wound care for thigh lesions  - nursing to bandage various arm lesions to help deter pt from picking  - air overlay matress  - wound culture   # CV: h/o HTN and positive for cocaine. Hypertensive on admission. No current signs of MI  - Tele  - Change home Metop to amlodipine given h/o cocaine  - Hydralazine PRN   # ID: no signs of sepsis, but w/ h/o Hep C and syphilis and active drug abuser. Cellulitis as above  - ABX as above  - HIV   # Psych: h/o depression and bipolar. Not taking any of his home medications. SI about 1 month ago but denies current SI/HI.  - Will wait to restart cymbalta at  time of DC  - home Depakote.  - Home trazadone PRN QHS ordered   # MSK: paraplegic w/ muscle spasticity. Not taking home medications. Source of chronic pain for pt.  - PT/OT  - Start home Zanaflex  - home gabapentin  # FEN/GI: Tolerating PO but w/ stomach pain likely from withdrawal. 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  - IVF of NS 162ml/hr (taking PO so no banana bag)  - Regular diet  - Zofran PRN  - Senna and miralax  Prophylaxis: Hep SQ TID  Disposition: pending possible placement at detox center - OT recommends SNF for continued OT services - PT recommends SNF/Home health PT; wheelchair cushion  Subjective: Patient requested SNF placement with CSW on 7/11. CIWA scores 3-14 in last 24hrs with ativan 1mg  x3.  Reports his chronic pain this morning.  States he had a bad night due to withdrawal symptoms.  Was out of bed with PT yesterday.  Denies chest pain or trouble breathing.  Still with some nausea, but no vomiting.  Did tolerate some PO yesterday.  Reports that he was "seeing things" yesterday, but not anymore.  Objective: Temp:  [97 F (36.1 C)-98 F (36.7 C)] 97.9 F (36.6 C) (07/12 0510) Pulse Rate:  [68-75] 68 (07/12 0510) Resp:  [18-20] 20 (07/12 0510) BP: (116-149)/(72-104) 131/83 mmHg (07/12 0510) SpO2:  [95 %-98 %] 95 % (07/12 0510)  Physical Exam: General:  in bed, comfortable, in no acute distress CV: RRR, no murmurs Pulm: CTAB Abd: soft, non distended, palpable liver 6cm below costal margin Extremities: large lesionon left leg being cleaned by wound care; also with multiple bandages and other wounds on legs, arms and buttock.  Bandages are clean and dry, open wounds do not appear infected. Neuro: alert & oriented x3. No focal defects. No resting tremor.   Laboratory:  Recent Labs Lab 03/10/13 1647 03/15/13 0741 03/15/13 1731  WBC 9.0 6.0 6.8  HGB 14.1 13.6 13.5  HCT 41.6 41.6 38.4*  PLT 135*  147* 131*    Recent Labs Lab 03/10/13 1605 03/15/13 0741 03/15/13 1731 03/16/13 0625  NA 139 141  --  140  K 3.2* 3.7  --  3.1*  CL 102 104  --  105  CO2 19 21  --  23  BUN 8 10  --  9  CREATININE 0.52 0.59 0.85 0.58  CALCIUM 8.8 9.2  --  8.3*  PROT 7.5 7.6  --  6.3  BILITOT 0.2* 0.4  --  0.3  ALKPHOS 157* 164*  --  137*  ALT 9 8  --  7  AST 27 24  --  17  GLUCOSE 93 106*  --  94    Alk phos: 137 Albumin: 2.6 AST: 17 ALT: 7 Ammonia: 18   Phebe Colla, MD 03/17/2013, 8:32 AM PGY-3, [place] [facility] FPTS Intern pager: [phone], text pages welcome

## 2013-03-17 NOTE — Progress Notes (Addendum)
Physical Therapy Wound Treatment Patient Details  Name: Jeffrey [name] MRN: [id number] Date of Birth: Dec 21, 1955  Today's Date: 03/17/2013 Time: 1478-2956 Time Calculation (min): 37 min  Subjective  Patient and Family Stated Goals: Save my leg Date of Onset:  (unknown; chronic wounds) Prior Treatments: gauze dressings  Pain Score: Pain Score: 8 ; RN provided medication to assist with pain control  Wound Assessment  Clinical Statement--: Lt lateral knee wound now 100% granulated and no longer requires hydrotherapy. Left posterior thigh wound already with incr granulation tissue. Anticipate he will only need 2-3 more treatments to get maximum benefit from hydrotherapy.  Wound 03/15/13 Abrasion(s) Thigh Left;Lateral;Posterior open sore with purulent drainage (Active)  Site / Wound Assessment Granulation tissue;Yellow;Pale;Pink 03/17/2013 10:23 AM  % Wound base Red or Granulating 25% 03/17/2013 10:23 AM  % Wound base Yellow 75% 03/17/2013 10:23 AM  % Wound base Black 0% 03/17/2013 10:23 AM  % Wound base Other (Comment) 0% 03/17/2013 10:23 AM  Peri-wound Assessment Edema;Intact;Erythema (blanchable) 03/17/2013 10:23 AM  Wound Length (cm)  03/16/2013  9:48 AM  Wound Width (cm)  03/16/2013  9:48 AM  Wound Depth (cm)  03/16/2013  9:48 AM  Margins Unattacted edges (unapproximated) 03/17/2013 10:23 AM  Closure None 03/17/2013 10:23 AM  Drainage Amount Minimal 03/17/2013 10:23 AM  Drainage Description Purulent;No odor 03/17/2013 10:23 AM  Non-staged Wound Description Partial thickness 03/17/2013 10:23 AM  Treatment Packing (Saline gauze);Tape changed;Hydrotherapy (Ultrasonic mist) 03/17/2013 10:23 AM  Dressing Type Barrier Film (skin prep);Gauze (Comment);Moist to moist;Paper tape;Other (Comment) 03/17/2013 10:23 AM  Dressing Changed Changed 03/17/2013 10:23 AM  Dressing Status Clean;Dry;Intact 03/17/2013 10:23 AM     Wound 03/15/13 Abrasion(s) Leg Left;Lateral;Lower open sore with purulent drainage (Active)   Site / Wound Assessment Red;Yellow 03/17/2013 10:23 AM  % Wound base Red or Granulating 100% 03/17/2013 10:23 AM  % Wound base Yellow 0% 03/17/2013 10:23 AM  % Wound base Black 0% 03/17/2013 10:23 AM  % Wound base Other (Comment) 0% 03/17/2013 10:23 AM  Peri-wound Assessment Intact;Erythema (blanchable) 03/17/2013 10:23 AM  Wound Length (cm)  03/16/2013  9:48 AM  Wound Width (cm)  03/16/2013  9:48 AM  Wound Depth (cm)  03/16/2013  9:48 AM  Margins Unattacted edges (unapproximated) 03/17/2013 10:23 AM  Closure None 03/17/2013 10:23 AM  Drainage Amount None 03/17/2013 10:23 AM  Drainage Description Purulent;No odor 03/16/2013  9:48 AM  Non-staged Wound Description Partial thickness 03/17/2013 10:23 AM  Treatment Hydrotherapy (Ultrasonic mist);Packing (Saline gauze);Tape changed 03/17/2013 10:23 AM  Dressing Type Barrier Film (skin prep);Gauze (Comment);Moist to moist;Paper tape 03/17/2013 10:23 AM  Dressing Changed Changed 03/17/2013 10:23 AM  Dressing Status Clean;Intact;Dry 03/17/2013 10:23 AM      Hydrotherapy Ultrasonic mist  - wound location: Left lateral knee;Left posterior thigh Ultrasonic mist at 35KHz (+/-3KHz) at ___ percent: 100 % Ultrasonic mist therapy minutes: 8 min   Wound Assessment and Plan  Wound Therapy - Assess/Plan/Recommendations Wound Therapy - Functional Problem List: pt at incr risk of infection due to open wounds; sitting tolerance limited due to open wound on posterior thigh (pt sits in his wheelchair 16 hrs/day per his report) Factors Delaying/Impairing Wound Healing: Altered sensation;Infection - systemic/local;Immobility;Multiple medical problems;Substance abuse Hydrotherapy Plan: Debridement;Dressing change;Patient/family education;Ultrasonic wound therapy @35  KHz (+/- 3 KHz) Wound Therapy - Frequency: 6X / week (anticipate decr to 3x/week vs d/c next week) Wound Therapy - Follow Up Recommendations: Other (comment) (will depend on medical needs; will not need hydrotherapy  )  Wound Therapy Goals- Improve the function  of patient's integumentary system by progressing the wound(s) through the phases of wound healing (inflammation - proliferation - remodeling) by: Decrease Necrotic Tissue to: 10% Lt posterior thigh Decrease Necrotic Tissue - Progress: Progressing toward goal Increase Granulation Tissue to: 90 Increase Granulation Tissue - Progress: Progressing toward goal Decrease Length/Width/Depth by (cm): 0.3 Decrease Length/Width/Depth - Progress: Progressing toward goal Improve Drainage Characteristics: Serous Improve Drainage Characteristics - Progress: Progressing toward goal Patient/Family will be able to : verbalize plan for wound care upon discharge Patient/Family Instruction Goal - Progress: Progressing toward goal Goals/treatment plan/discharge plan were made with and agreed upon by patient/family: Yes Time For Goal Achievement: 7 days Wound Therapy - Potential for Goals: Good  Goals will be updated until maximal potential achieved or discharge criteria met.  Discharge criteria: when goals achieved, discharge from hospital, MD decision/surgical intervention, no progress towards goals, refusal/missing three consecutive treatments without notification or medical reason.  GP     Jeffrey [name] 03/17/2013, 10:33 AM Pager [phone]

## 2013-03-17 NOTE — Progress Notes (Signed)
Pt sitting on side of bed with bed rail down. Explained dangers of sitting on side of air bed with bed inflated, particularly with this pt d/t poor mobility, recommended raising right lower bed rail. Pt refuses, pt continues to put down rails on his own. Pt with frequent falls prior to admission. Will continue to monitor.

## 2013-03-17 NOTE — Progress Notes (Signed)
MD notified that pt is at max dose for acetaminophen. Will continue to monitor.

## 2013-03-18 DIAGNOSIS — F3289 Other specified depressive episodes: Secondary | ICD-10-CM

## 2013-03-18 DIAGNOSIS — F1110 Opioid abuse, uncomplicated: Secondary | ICD-10-CM

## 2013-03-18 DIAGNOSIS — F329 Major depressive disorder, single episode, unspecified: Secondary | ICD-10-CM

## 2013-03-18 LAB — BASIC METABOLIC PANEL
CO2: 26 mEq/L (ref 19–32)
Calcium: 9.1 mg/dL (ref 8.4–10.5)
Creatinine, Ser: 0.75 mg/dL (ref 0.50–1.35)
GFR calc non Af Amer: >90 mL/min (ref >90)
Glucose, Bld: 93 mg/dL (ref 70–99)
Sodium: 140 mEq/L (ref 135–145)

## 2013-03-18 LAB — MAGNESIUM: Magnesium: 2 mg/dL (ref 1.5–2.5)

## 2013-03-18 LAB — HIV ANTIBODY (ROUTINE TESTING W REFLEX): HIV: NONREACTIVE

## 2013-03-18 LAB — CBC
MCH: 28.1 pg (ref 26.0–34.0)
MCV: 84.8 fL (ref 78.0–100.0)
Platelets: 140 10*3/uL — ABNORMAL LOW (ref 150–400)
RBC: 4.8 MIL/uL (ref 4.22–5.81)
RDW: 17 % — ABNORMAL HIGH (ref 11.5–15.5)
WBC: 3.8 10*3/uL — ABNORMAL LOW (ref 4.0–10.5)

## 2013-03-18 MED ORDER — HYDROXYZINE HCL 25 MG PO TABS
25.0000 mg | ORAL_TABLET | Freq: Once | ORAL | Status: AC
  Administered 2013-03-18: 25 mg via ORAL
  Filled 2013-03-18: qty 1

## 2013-03-18 NOTE — Progress Notes (Signed)
Family Medicine Teaching Service Daily Progress Note Intern Pager: [phone]  Patient name: Jeffrey [name] Medical record number: [id number] Date of birth: Jan 10, 1956 Age: 57 y.o. Gender: male  Primary Care Provider: No primary provider on file. Consultants: WOC, SW, PT/OT Code Status: DNR/DNI  Pt Overview and Major Events to Date:  - continued CIWA protocol  Assessment and Plan: Jeffrey [name] is a 57 y.o. year old homeless male w/ h/o Hep C, Etoh abuse w/ cirrhosis, polysubstance abuse, HTN, bipolar, paraplegia, presenting with tremors and soft tissue infection.   # Substance Abuse: H/o polysubstance abuse with UDS positive for cocaine, THC, benzo on admission.  Last alcohol about 24 hours prior to admission; EtOH negative on admission.   - CIWA protocol  - librium 5mg  TID - Etoh abuse counseling  - Nicotine patch (21mg )  - scheduled thiamine, folate and multivitamin - CIWA scores 9-17 in last 24hrs with ativan 1mg  x4.  [ ]  f/u social work for placement [ ]  continue to monitor for DT symptoms  # Skin Wounds/Infection: numerous open skin wounds, 2 of which appear infected and possibly cellulitic. All in various stages of healing. Pt endorses picking of mosquito bites on upper and lower extremities. LE extremity wound borders demarcated w/ skin marking pen. No signs of systemic illness. Recent Rx for Keflex on 02/27/13 was not filled.  - Doxy for cellulitis and presumed MRSA.  - wound care for thigh lesions  - nursing to bandage various arm lesions to help deter pt from picking  - air overlay matress  - wound culture - Staph aureus, sensitivities pending  # CV: h/o HTN and positive for cocaine. Hypertensive on admission. No current signs of MI  - Tele  - Change home Metop to amlodipine given h/o cocaine  - Hydralazine PRN   # ID: no signs of sepsis, but w/ h/o Hep C and syphilis and active drug abuser. Cellulitis as above  - ABX as above  - HIV   # Psych: h/o depression and  bipolar. Not taking any of his home medications. SI about 1 month ago but denies current SI/HI.  - Will wait to restart cymbalta at time of DC  - home Depakote.  - Home trazadone PRN QHS ordered   # MSK: paraplegic w/ muscle spasticity. Not taking home medications. Source of chronic pain for pt.  - PT/OT  - Start home Zanaflex  - home gabapentin  # FEN/GI: Tolerating PO but w/ stomach pain likely from withdrawal. 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  - IVF of NS 118ml/hr (taking PO so no banana bag)  - Regular diet  - Zofran PRN  - Senna and miralax  Prophylaxis: Hep SQ TID  Disposition: pending possible placement at detox center - OT recommends SNF for continued OT services - PT recommends SNF/Home health PT; wheelchair cushion  Subjective: Reports his chronic pain this morning.  States he had a bad night due not sleeping well. Denies chest pain or trouble breathing.  Still with some nausea, but no vomiting. No hallucinations.  Objective: Temp:  [97.8 F (36.6 C)-98.8 F (37.1 C)] 97.8 F (36.6 C) (07/13 0556) Pulse Rate:  [62-87] 62 (07/13 0556) Resp:  [18-20] 18 (07/13 0556) BP: (128-170)/(77-104) 139/84 mmHg (07/13 0556) SpO2:  [95 %-100 %] 97 % (07/13 0556) Weight:  [238 lb 15.7 oz (108.4 kg)] 238 lb 15.7 oz (108.4 kg) (07/12 2056)  Physical Exam: General: in bed, comfortable,  in no acute distress CV: RRR, no murmurs Pulm: CTAB Abd: soft, non distended, palpable liver 6cm below costal margin Extremities: lesions on left leg bandaged: clean, dry and intact, also with multiple bandages and other wounds on legs, arms and buttock. Neuro: alert & oriented x3. No focal defects. No resting tremor.  Laboratory:  Recent Labs Lab 03/15/13 0741 03/15/13 1731 03/18/13 0545  WBC 6.0 6.8 3.8*  HGB 13.6 13.5 13.5  HCT 41.6 38.4* 40.7  PLT 147* 131* 140*    Recent Labs Lab 03/15/13 0741 03/15/13 1731  03/16/13 0625  NA 141  --  140  K 3.7  --  3.1*  CL 104  --  105  CO2 21  --  23  BUN 10  --  9  CREATININE 0.59 0.85 0.58  CALCIUM 9.2  --  8.3*  PROT 7.6  --  6.3  BILITOT 0.4  --  0.3  ALKPHOS 164*  --  137*  ALT 8  --  7  AST 24  --  17  GLUCOSE 106*  --  94    Jeffrey Hawking, MD 03/18/2013, 7:28 AM PGY-1, [facility] FPTS Intern pager: [phone], text pages welcome

## 2013-03-18 NOTE — Progress Notes (Signed)
FMTS Attending Admission Note: Jeffrey Sestak,MD I  have seen and examined this patient, reviewed their chart. I have discussed this patient with the resident. I agree with the resident's findings, assessment and care plan.  

## 2013-03-19 DIAGNOSIS — M1710 Unilateral primary osteoarthritis, unspecified knee: Secondary | ICD-10-CM

## 2013-03-19 DIAGNOSIS — R45851 Suicidal ideations: Secondary | ICD-10-CM

## 2013-03-19 DIAGNOSIS — IMO0002 Reserved for concepts with insufficient information to code with codable children: Secondary | ICD-10-CM

## 2013-03-19 LAB — BASIC METABOLIC PANEL
BUN: 10 mg/dL (ref 6–23)
Creatinine, Ser: 0.71 mg/dL (ref 0.50–1.35)
GFR calc Af Amer: >90 mL/min (ref >90)
GFR calc non Af Amer: >90 mL/min (ref >90)

## 2013-03-19 LAB — CBC
MCHC: 34.1 g/dL (ref 30.0–36.0)
Platelets: 143 10*3/uL — ABNORMAL LOW (ref 150–400)
RDW: 16.7 % — ABNORMAL HIGH (ref 11.5–15.5)
WBC: 4.5 10*3/uL (ref 4.0–10.5)

## 2013-03-19 MED ORDER — FLEET ENEMA 7-19 GM/118ML RE ENEM
1.0000 | ENEMA | Freq: Once | RECTAL | Status: AC
  Administered 2013-03-19: 1 via RECTAL
  Filled 2013-03-19: qty 1

## 2013-03-19 NOTE — Progress Notes (Signed)
Family Medicine Teaching Service Daily Progress Note Intern Pager: [phone]  Patient name: Jeffrey [name] Medical record number: [id number] Date of birth: August 22, 1956 Age: 57 y.o. Gender: male  Primary Care Provider: No primary provider on file. Consultants: WOC, SW, PT/OT Code Status: DNR/DNI  Pt Overview and Major Events to Date:  - continued CIWA protocol  Assessment and Plan: Jeffrey [name] is a 57 y.o. year old homeless male w/ h/o Hep C, Etoh abuse w/ cirrhosis, polysubstance abuse, HTN, bipolar, paraplegia, presenting with tremors and soft tissue infection.   # Substance Abuse: H/o polysubstance abuse with UDS positive for cocaine, THC, benzo on admission.  Last alcohol about 24 hours prior to admission; EtOH negative on admission.   - CIWA protocol  - librium 5mg  TID - Etoh abuse counseling  - Nicotine patch (21mg )  - scheduled thiamine, folate and multivitamin - CIWA scores 7-9 in last 24hrs with ativan 1mg  x4.  [ ]  f/u social work for placement [ ]  continue to monitor for DT symptoms  # LRQ abdominal tenderness - tender for over a year. Hasn't had a bowel movement in 5 days. No leukocytosis or fever. - fleet enema x1 today  # Skin Wounds/Infection: numerous open skin wounds, 2 of which appear infected and possibly cellulitic. All in various stages of healing. Pt endorses picking of mosquito bites on upper and lower extremities. LE extremity wound borders demarcated w/ skin marking pen. No signs of systemic illness. Recent Rx for Keflex on 02/27/13 was not filled.  - Doxy for cellulitis and presumed MRSA, day #5. - wound care for thigh lesions  - nursing to bandage various arm lesions to help deter pt from picking  - air overlay matress  - wound culture - Staph aureus, sensitivities pending - wound care continuing to follow  # CV: h/o HTN and positive for cocaine. Hypertensive on admission. No current signs of MI  - Tele  - Change home Metop to amlodipine given h/o  cocaine  - Hydralazine PRN   # ID: no signs of sepsis, but w/ h/o Hep C and syphilis and active drug abuser. Cellulitis as above  - ABX as above  - HIV   # Psych: h/o depression and bipolar. Not taking any of his home medications. SI about 1 month ago but denies current SI/HI.  - Will wait to restart cymbalta at time of DC  - home Depakote.  - Home trazadone PRN QHS ordered   # MSK: paraplegic w/ muscle spasticity. Not taking home medications. Source of chronic pain for pt.  - PT/OT  - Start home Zanaflex  - home gabapentin  # FEN/GI: Tolerating PO but w/ stomach pain likely from withdrawal. 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  - IVF of NS 131ml/hr (taking PO so no banana bag)  - Regular diet  - Zofran PRN  - Senna and miralax  Prophylaxis: Hep SQ TID  Disposition: pending possible placement at detox center - OT recommends SNF for continued OT services - PT recommends SNF/Home health PT; wheelchair cushion  Subjective: Patient feels better overall. No hallucination, shakiness, chest pain, chest palpitations or vomiting. Still slightly nauseated. No bowel movement for 5 days, but passing gas.  Objective: Temp:  [97.7 F (36.5 C)-98.2 F (36.8 C)] 97.7 F (36.5 C) (07/14 0459) Pulse Rate:  [59-85] 59 (07/14 0459) Resp:  [18-20] 18 (07/14 0459) BP: (130-167)/(80-103) 156/96 mmHg (07/14 0459) SpO2:  [96 %-98 %]  98 % (07/14 0459) Weight:  [236 lb 1.6 oz (107.094 kg)] 236 lb 1.6 oz (107.094 kg) (07/13 2025)  Physical Exam: General: in bed, comfortable, in no acute distress CV: RRR, no murmurs Pulm: CTAB Abd: soft, non distended, palpable liver 6cm below costal margin, LRQ tenderness on deep palpation with guarding, no rebound. Extremities: lesions on left leg bandaged: clean, dry and intact, also with multiple bandages and other wounds on legs, arms and buttock. Neuro: alert & oriented x3. No focal defects. No resting  tremor.  Laboratory:  Recent Labs Lab 03/15/13 1731 03/18/13 0545 03/19/13 0733  WBC 6.8 3.8* 4.5  HGB 13.5 13.5 13.4  HCT 38.4* 40.7 39.3  PLT 131* 140* 143*    Recent Labs Lab 03/15/13 0741 03/15/13 1731 03/16/13 0625 03/18/13 0545  NA 141  --  140 140  K 3.7  --  3.1* 3.5  CL 104  --  105 102  CO2 21  --  23 26  BUN 10  --  9 10  CREATININE 0.59 0.85 0.58 0.75  CALCIUM 9.2  --  8.3* 9.1  PROT 7.6  --  6.3  --   BILITOT 0.4  --  0.3  --   ALKPHOS 164*  --  137*  --   ALT 8  --  7  --   AST 24  --  17  --   GLUCOSE 106*  --  94 93    Jacquelin Hawking, MD 03/19/2013, 7:55 AM PGY-1, [place] FPTS Intern pager: [phone], text pages welcome

## 2013-03-19 NOTE — Progress Notes (Signed)
Physical Therapy Wound Treatment Patient Details  Name: Jeffrey [name] MRN: [id number] Date of Birth: [birth year]  Today's Date: 03/19/2013 Time: 4098-1191 Time Calculation (min): 37 min  Subjective  Patient and Family Stated Goals: Save my leg Date of Onset:  (unknown; chronic wounds) Prior Treatments: gauze dressings  Pain Score: 8/10 (chronic back pain); RN provided medication to assist with pain control   Wound Assessment  Wound 03/15/13 Abrasion(s) Thigh Left;Lateral;Posterior open sore with purulent drainage (Active)  Site / Wound Assessment Granulation tissue;Yellow;Pale;Pink 03/19/2013 10:41 AM  % Wound base Red or Granulating 65% 03/19/2013 10:41 AM  % Wound base Yellow 35% 03/19/2013 10:41 AM  % Wound base Black 0% 03/19/2013 10:41 AM  % Wound base Other (Comment) 0% 03/19/2013 10:41 AM  Peri-wound Assessment Edema;Intact;Erythema (blanchable) 03/19/2013 10:41 AM  Wound Length (cm)  03/16/2013  9:48 AM  Wound Width (cm)  03/16/2013  9:48 AM  Wound Depth (cm)  03/16/2013  9:48 AM  Margins Unattacted edges (unapproximated) 03/19/2013 10:41 AM  Closure None 03/19/2013 10:41 AM  Drainage Amount Minimal 03/19/2013 10:41 AM  Drainage Description Purulent;No odor;Serosanguineous 03/19/2013 10:41 AM  Non-staged Wound Description Partial thickness 03/19/2013 10:41 AM  Treatment Hydrotherapy (Ultrasonic mist);Packing (Saline gauze);Tape changed 03/19/2013 10:41 AM  Dressing Type Barrier Film (skin prep);Gauze (Comment);Moist to moist;Paper tape;Other (Comment) 03/19/2013 10:41 AM  Dressing Changed Changed 03/19/2013 10:41 AM  Dressing Status Clean;Dry;Intact 03/19/2013 10:41 AM     Dressing Status Clean;Dry;Intact 03/19/2013 10:41 AM      Hydrotherapy Ultrasonic mist  - wound location: Left posterior thigh Ultrasonic mist at 35KHz (+/-3KHz) at ___ percent: 100 % Ultrasonic mist therapy minutes: 8 min   Wound Assessment and Plan  Wound Therapy - Assess/Plan/Recommendations Wound Therapy -  Clinical Statement: Lt lateral knee wound now 100% granulated and no longer requires hydrotherapy. Left posterior thigh wound already with incr granulation tissue. Anticipate he will only need 2-3 more treatments to get maximum benefit from hydrotherapy. Wound Therapy - Functional Problem List: pt at incr risk of infection due to open wounds; sitting tolerance limited due to open wound on posterior thigh (pt sits in his wheelchair 16 hrs/day per his report) Factors Delaying/Impairing Wound Healing: Altered sensation;Infection - systemic/local;Immobility;Multiple medical problems;Substance abuse Hydrotherapy Plan: Debridement;Dressing change;Patient/family education;Ultrasonic wound therapy @35  KHz (+/- 3 KHz) Wound Therapy - Frequency: 6X / week (anticipate decr to 3x/week vs d/c next week) Wound Therapy - Follow Up Recommendations: Other (comment) (will depend on medical needs; will not need hydrotherapy )  Wound Therapy Goals- Improve the function of patient's integumentary system by progressing the wound(s) through the phases of wound healing (inflammation - proliferation - remodeling) by: Decrease Necrotic Tissue to: 10% Lt posterior thigh Decrease Necrotic Tissue - Progress: Progressing toward goal Increase Granulation Tissue to: 90 Increase Granulation Tissue - Progress: Progressing toward goal Decrease Length/Width/Depth by (cm): 0.3 Decrease Length/Width/Depth - Progress: Progressing toward goal Improve Drainage Characteristics: Serous Improve Drainage Characteristics - Progress: Progressing toward goal Patient/Family will be able to : verbalize plan for wound care upon discharge Patient/Family Instruction Goal - Progress: Progressing toward goal Goals/treatment plan/discharge plan were made with and agreed upon by patient/family: Yes Time For Goal Achievement: 7 days Wound Therapy - Potential for Goals: Good  Goals will be updated until maximal potential achieved or discharge criteria  met.  Discharge criteria: when goals achieved, discharge from hospital, MD decision/surgical intervention, no progress towards goals, refusal/missing three consecutive treatments without notification or medical reason.  GP     Maritta Kief 03/19/2013,  11:38 AM Pager [phone]

## 2013-03-19 NOTE — Progress Notes (Signed)
FMTS Attending Daily Note: Kathia Covington MD [phone] pager office [phone] I have discussed this patient with the resident and reviewed the assessment and plan as documented above. I agree wit the resident's findings and plan.  

## 2013-03-20 DIAGNOSIS — F322 Major depressive disorder, single episode, severe without psychotic features: Secondary | ICD-10-CM

## 2013-03-20 LAB — BASIC METABOLIC PANEL
BUN: 10 mg/dL (ref 6–23)
Chloride: 101 mEq/L (ref 96–112)
GFR calc Af Amer: >90 mL/min (ref >90)
GFR calc non Af Amer: >90 mL/min (ref >90)
Potassium: 3.4 mEq/L — ABNORMAL LOW (ref 3.5–5.1)
Sodium: 139 mEq/L (ref 135–145)

## 2013-03-20 LAB — WOUND CULTURE: Gram Stain: NONE SEEN

## 2013-03-20 LAB — CBC
Hemoglobin: 13.9 g/dL (ref 13.0–17.0)
MCHC: 33.5 g/dL (ref 30.0–36.0)
RBC: 4.9 MIL/uL (ref 4.22–5.81)
WBC: 4.9 10*3/uL (ref 4.0–10.5)

## 2013-03-20 MED ORDER — CEPHALEXIN 500 MG PO CAPS
500.0000 mg | ORAL_CAPSULE | Freq: Four times a day (QID) | ORAL | Status: AC
  Administered 2013-03-21 – 2013-03-27 (×28): 500 mg via ORAL
  Filled 2013-03-20 (×29): qty 1

## 2013-03-20 NOTE — Progress Notes (Signed)
Family Medicine Teaching Service Daily Progress Note Intern Pager: [phone]  Patient name: Jeffrey [name] Medical record number: [id number] Date of birth: [birth year] Age: 57 y.o. Gender: male  Primary Care Provider: No primary provider on file. Consultants: WOC, SW, PT/OT Code Status: DNR/DNI  Pt Overview and Major Events to Date:  - continued CIWA protocol  Assessment and Plan: Jeffrey [name] is a 57 y.o. year old homeless male w/ h/o Hep C, Etoh abuse w/ cirrhosis, polysubstance abuse, HTN, bipolar, paraplegia, presenting with tremors and soft tissue infection.   # Substance Abuse: H/o polysubstance abuse with UDS positive for cocaine, THC, benzo on admission.  Last alcohol about 24 hours prior to admission; EtOH negative on admission.   - CIWA protocol  - librium 5mg  TID - Etoh abuse counseling  - Nicotine patch (21mg )  - scheduled thiamine, folate and multivitamin - librium discontinued  [ ]  f/u social work for placement [ ]  continue to monitor for DT symptoms  # LRQ abdominal tenderness - tender for over a year. Hasn't had a bowel movement in 5 days. No leukocytosis or fever. - bowel movement yesterday  # Skin Wounds/Infection: numerous open skin wounds, 2 of which appear infected and possibly cellulitic. All in various stages of healing. Pt endorses picking of mosquito bites on upper and lower extremities. LE extremity wound borders demarcated w/ skin marking pen. No signs of systemic illness. Recent Rx for Keflex on 02/27/13 was not filled.  - Doxy for cellulitis and presumed MRSA, day #6. - wound care for thigh lesions  - nursing to bandage various arm lesions to help deter pt from picking  - air overlay matress  - wound culture - Staph aureus, MSSA - wound care continuing to follow - d/c doxycycline. Last dose today - start keflex 500mg  QID for 7 days starting tomorrow  # CV: h/o HTN and positive for cocaine. Hypertensive on admission. No current signs of MI  - Tele   - Change home Metop to amlodipine given h/o cocaine  - Hydralazine PRN   # ID: no signs of sepsis, but w/ h/o Hep C and syphilis and active drug abuser. Cellulitis as above  - ABX as above  - HIV   # Psych: h/o depression and bipolar. Not taking any of his home medications. SI about 1 month ago but denies current SI/HI.  - Will wait to restart cymbalta at time of DC  - home Depakote.  - Home trazadone PRN QHS ordered   # MSK: paraplegic w/ muscle spasticity. Not taking home medications. Source of chronic pain for pt.  - PT/OT  - Start home Zanaflex  - home gabapentin  # FEN/GI: Tolerating PO but w/ stomach pain likely from withdrawal. 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  - IVF of NS 192ml/hr (taking PO so no banana bag)  - Regular diet  - Zofran PRN  - Senna and miralax  Prophylaxis: Hep SQ TID  Disposition: pending possible placement at detox center - OT recommends SNF for continued OT services - PT recommends SNF/Home health PT; wheelchair cushion  Subjective: Patient feels better overall. No hallucination, shakiness, chest pain, chest palpitations or vomiting. Still slightly nauseated. Had a bowel movement yesterday.  Objective: Temp:  [97.3 F (36.3 C)-98.7 F (37.1 C)] 98 F (36.7 C) (07/15 0508) Pulse Rate:  [58-75] 58 (07/15 0508) Resp:  [16-20] 20 (07/15 0508) BP: (160-188)/(96-114) 163/111 mmHg (07/15 0508) SpO2:  [95 %-  100 %] 95 % (07/15 0508) Weight:  [233 lb 6.4 oz (105.87 kg)] 233 lb 6.4 oz (105.87 kg) (07/14 2100)  Physical Exam: General: in bed, comfortable, in no acute distress CV: RRR, no murmurs Pulm: CTAB Abd: soft, non distended, palpable liver 6cm below costal margin. Extremities: lesions on left leg bandaged: clean, dry and intact, also with multiple bandages and other wounds on legs, arms and buttock. Neuro: alert & oriented x3. No focal defects. No resting  tremor.  Laboratory:  Recent Labs Lab 03/15/13 1731 03/18/13 0545 03/19/13 0733  WBC 6.8 3.8* 4.5  HGB 13.5 13.5 13.4  HCT 38.4* 40.7 39.3  PLT 131* 140* 143*    Recent Labs Lab 03/15/13 0741  03/16/13 0625 03/18/13 0545 03/19/13 0733  NA 141  --  140 140 136  K 3.7  --  3.1* 3.5 3.7  CL 104  --  105 102 101  CO2 21  --  23 26 27   BUN 10  --  9 10 10   CREATININE 0.59  < > 0.58 0.75 0.71  CALCIUM 9.2  --  8.3* 9.1 8.9  PROT 7.6  --  6.3  --   --   BILITOT 0.4  --  0.3  --   --   ALKPHOS 164*  --  137*  --   --   ALT 8  --  7  --   --   AST 24  --  17  --   --   GLUCOSE 106*  --  94 93 89  < > = values in this interval not displayed.  Jeffrey Hawking, MD 03/20/2013, 7:17 AM PGY-1, [facility] FPTS Intern pager: [phone], text pages welcome

## 2013-03-20 NOTE — Progress Notes (Signed)
Occupational Therapy Treatment Patient Details Name: Jeffrey [name] MRN: [id number] DOB: 1956/08/13 Today's Date: 03/20/2013 Time: 4098-1191 OT Time Calculation (min): 33 min  OT Assessment / Plan / Recommendation  OT comments  Pt did participate with much encouragement with many complaints during OT session  Follow Up Recommendations  SNF       Equipment Recommendations  None recommended by OT (pressure relieving W/C cushion)       Frequency Min 2X/week   Progress towards OT Goals Progress towards OT goals: Progressing toward goals            ADL  Grooming: Performed;Wash/dry hands;Wash/dry face;Teeth care;Brushing hair Where Assessed - Grooming: Supported sitting ADL Comments: Pt performed grooming in supported sitting      OT Goals(current goals can now be found in the care plan section) Acute Rehab OT Goals OT Goal Formulation: With patient  Visit Information  Assistance Needed: +2 History of Present Illness: Jeffrey [name] is a 57 y.o. year old homeless male w/ h/o Hep C, Etoh abuse w/ cirrhosis, polysubstance abuse, HTN, bipolar, paraplegia, presenting with tremors and soft tissue infection/multiple wounds (2 being followed by hydrotherapy)          Cognition  Cognition Arousal/Alertness: Awake/alert Behavior During Therapy: WFL for tasks assessed/performed Overall Cognitive Status: Within Functional Limits for tasks assessed             End of Session OT - End of Session Activity Tolerance: Patient limited by pain Patient left: in chair;with call bell/phone within reach Nurse Communication: Mobility status  GO     Alba Cory 03/20/2013, 3:43 PM

## 2013-03-20 NOTE — Progress Notes (Signed)
FMTS Attending Daily Note: Emmerson Taddei MD [phone] pager office [phone] I have discussed this patient with the resident and reviewed the assessment and plan as documented above. I agree wit the resident's findings and plan.  

## 2013-03-20 NOTE — Consult Note (Addendum)
WOC follow-up:  Wounds have greatly improved since hydrotherapy and Santyl for chemical debridement.  Left anterior thigh, left knee, left hip and left ischium/buttock all beefy red with small yellow drainage, no odor, no erythema surrounding.  No further need for hydrotherapy or Santyl, both treatments discontinued.  Foam dressings to protect and promote healing.  Pt is stable from a wound care standpoint for transfer out of acute care setting. Pt continues to scratch and pick at multiple scabbed areas on arms and legs.  Red partial thickness wounds have evolved which are also covered with foam dressings to protect from further injury.  Please re-consult if further assistance is needed.  Thank-you,  Cammie Mcgee MSN, RN, CWOCN, [place], CNS [phone]

## 2013-03-20 NOTE — Progress Notes (Signed)
Hydrotherapy Discharge Patient Details Name: Jeffrey [name] MRN: [id number] DOB: February 25, 1956 Today's Date: 03/20/2013 Time:  -     Patient discharged from PT services secondary to excellent progress and discontinued by [facility], RN.Marland Kitchen  Please see latest hydrotherapy progress note for most recent status and progress toward goals.    Progress and discharge plan discussed with patient by WOC, RN  and agrees.  GP     Jeffrey [name] Pager [phone]  03/20/2013, 12:50 PM

## 2013-03-20 NOTE — Progress Notes (Signed)
Physical Therapy Treatment Patient Details Name: Jeffrey [name] MRN: [id number] DOB: 12-19-1955 Today's Date: 03/20/2013 Time: 4098-1191 PT Time Calculation (min): 29 min  PT Assessment / Plan / Recommendation  PT Comments   Pt requires frequent cues to redirect attention to task.  Follow Up Recommendations  SNF     Does the patient have the potential to tolerate intense rehabilitation     Barriers to Discharge        Equipment Recommendations  Wheelchair cushion (measurements PT)    Recommendations for Other Services    Frequency Min 2X/week   Progress towards PT Goals Progress towards PT goals: Not progressing toward goals - comment (self limiting)  Plan Current plan remains appropriate    Precautions / Restrictions Precautions Precautions: Fall   Pertinent Vitals/Pain Multiple c/o's of pain in back.  Had pain meds earlier and nurse aware pt wants more meds soon.    Mobility  Bed Mobility Bed Mobility: Supine to Sit Supine to Sit: 4: Min assist Transfers Transfers: Heritage manager Transfers: 4: Min assist;With upper extremity assistance Details for Transfer Assistance: Assist to guide hips    Exercises     PT Diagnosis:    PT Problem List:   PT Treatment Interventions:     PT Goals (current goals can now be found in the care plan section)    Visit Information  Last PT Received On: 03/20/13 Assistance Needed: +2 History of Present Illness: Jeffrey [name] is a 57 y.o. year old homeless male w/ h/o Hep C, Etoh abuse w/ cirrhosis, polysubstance abuse, HTN, bipolar, paraplegia, presenting with tremors and soft tissue infection/multiple wounds (2 being followed by hydrotherapy)    Subjective Data      Cognition  Cognition Arousal/Alertness: Awake/alert Behavior During Therapy:  (Constant complaints about everything.) Overall Cognitive Status: Within Functional Limits for tasks assessed    Balance  Static Sitting Balance Static Sitting -  Balance Support: Bilateral upper extremity supported Static Sitting - Level of Assistance: 5: Stand by assistance  End of Session PT - End of Session Activity Tolerance: Patient limited by pain;Other (comment) (Difficult to get pt to focus on task instead of complainint) Patient left: in chair;with call bell/phone within reach Nurse Communication: Mobility status;Patient requests pain meds;Other (comment) (IV out)   GP     [facility] 03/20/2013, 3:50 PM  [facility] PT [phone]

## 2013-03-21 DIAGNOSIS — D638 Anemia in other chronic diseases classified elsewhere: Secondary | ICD-10-CM

## 2013-03-21 LAB — BASIC METABOLIC PANEL
Chloride: 100 mEq/L (ref 96–112)
GFR calc non Af Amer: >90 mL/min (ref >90)
Glucose, Bld: 95 mg/dL (ref 70–99)
Potassium: 3.9 mEq/L (ref 3.5–5.1)
Sodium: 137 mEq/L (ref 135–145)

## 2013-03-21 LAB — CBC
HCT: 43 % (ref 39.0–52.0)
Hemoglobin: 14 g/dL (ref 13.0–17.0)
MCH: 27.8 pg (ref 26.0–34.0)
RBC: 5.04 MIL/uL (ref 4.22–5.81)

## 2013-03-21 MED ORDER — GABAPENTIN 300 MG PO CAPS
300.0000 mg | ORAL_CAPSULE | Freq: Three times a day (TID) | ORAL | Status: DC
  Administered 2013-03-21 – 2013-03-25 (×14): 300 mg via ORAL
  Filled 2013-03-21 (×18): qty 1

## 2013-03-21 NOTE — Progress Notes (Signed)
Family Medicine Teaching Service Daily Progress Note Intern Pager: [phone]  Patient name: Guinn Delarosa Medical record number: [id number] Date of birth: [birth year] Age: 57 y.o. Gender: male  Primary Care Provider: No primary provider on file. Consultants: WOC, SW, PT/OT Code Status: DNR/DNI  Pt Overview and Major Events to Date:  - continued CIWA protocol  Assessment and Plan: Zhane Bluitt is a 57 y.o. year old homeless male w/ h/o Hep C, Etoh abuse w/ cirrhosis, polysubstance abuse, HTN, bipolar, paraplegia, presenting with tremors and soft tissue infection.   # Substance Abuse: H/o polysubstance abuse with UDS positive for cocaine, THC, benzo on admission.  Last alcohol about 24 hours prior to admission; EtOH negative on admission.   - Etoh abuse counseling  - Nicotine patch (21mg )  - scheduled thiamine, folate and multivitamin - librium discontinued  [ ]  f/u social work for placement [ ]  continue to monitor for DT symptoms  # LRQ abdominal tenderness - tender for over a year. Hasn't had a bowel movement in 5 days. No leukocytosis or fever. - bowel movement yesterday  # Skin Wounds/Infection: numerous open skin wounds, 2 of which appear infected and possibly cellulitic. All in various stages of healing. Pt endorses picking of mosquito bites on upper and lower extremities. LE extremity wound borders demarcated w/ skin marking pen. No signs of systemic illness. Recent Rx for Keflex on 02/27/13 was not filled.  - Doxy for cellulitis and presumed MRSA, day #6. - wound care for thigh lesions  - nursing to bandage various arm lesions to help deter pt from picking  - air overlay matress  - wound culture - Staph aureus, MSSA - wound care continuing to follow - keflex 500mg  QID for 7 days (day #1 - 7/16)  # CV: h/o HTN and positive for cocaine. Hypertensive on admission. No current signs of MI  - Tele  - Change home Metop to amlodipine given h/o cocaine  - Hydralazine PRN   # ID:  no signs of sepsis, but w/ h/o Hep C and syphilis and active drug abuser. Cellulitis as above  - ABX as above  - HIV   # Psych: h/o depression and bipolar. Not taking any of his home medications. SI about 1 month ago but denies current SI/HI.  - Will wait to restart cymbalta at time of DC  - home Depakote.  - Home trazadone PRN QHS ordered   # MSK: paraplegic w/ muscle spasticity. Not taking home medications. Source of chronic pain for pt.  - PT/OT  - Start home Zanaflex  - home gabapentin  # FEN/GI: Tolerating PO but w/ stomach pain likely from withdrawal. 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  - IVF of NS 181ml/hr (taking PO so no banana bag)  - Regular diet  - Zofran PRN  - Senna and miralax  Prophylaxis: Hep SQ TID  Disposition: pending placement at SNF - OT recommends SNF for continued OT services - PT recommends SNF/Home health PT; wheelchair cushion  Subjective: Patient feels better overall. No hallucination, shakiness, chest pain, chest palpitations or vomiting. Still slightly nauseated.  Objective: Temp:  [98 F (36.7 C)-98.6 F (37 C)] 98 F (36.7 C) (07/16 0548) Pulse Rate:  [66-91] 67 (07/16 0548) Resp:  [18-20] 20 (07/16 0548) BP: (129-167)/(93-114) 129/96 mmHg (07/16 0548) SpO2:  [93 %-98 %] 94 % (07/16 0548) Weight:  [233 lb 14.5 oz (106.1 kg)] 233 lb 14.5 oz (106.1 kg) (  07/15 2305)  Physical Exam: General: in bed, comfortable, in no acute distress CV: RRR, no murmurs Pulm: CTAB Abd: soft, non distended, palpable liver 6cm below costal margin. Extremities: lesions on left leg bandaged: clean, dry and intact, also with multiple bandages and other wounds on legs, arms and buttock. Neuro: alert & oriented x3. No focal defects. Mild  tremor.  Laboratory:  Recent Labs Lab 03/19/13 0733 03/20/13 0815 03/21/13 0826  WBC 4.5 4.9 5.5  HGB 13.4 13.9 14.0  HCT 39.3 41.5 43.0  PLT 143* 146* 176     Recent Labs Lab 03/15/13 0741  03/16/13 0625  03/19/13 0733 03/20/13 0815 03/21/13 0826  NA 141  --  140  < > 136 139 137  K 3.7  --  3.1*  < > 3.7 3.4* 3.9  CL 104  --  105  < > 101 101 100  CO2 21  --  23  < > 27 25 25   BUN 10  --  9  < > 10 10 12   CREATININE 0.59  < > 0.58  < > 0.71 0.66 0.68  CALCIUM 9.2  --  8.3*  < > 8.9 9.1 9.3  PROT 7.6  --  6.3  --   --   --   --   BILITOT 0.4  --  0.3  --   --   --   --   ALKPHOS 164*  --  137*  --   --   --   --   ALT 8  --  7  --   --   --   --   AST 24  --  17  --   --   --   --   GLUCOSE 106*  --  94  < > 89 86 95  < > = values in this interval not displayed.  Jacquelin Hawking, MD 03/21/2013, 9:39 AM PGY-1, [facility] FPTS Intern pager: [phone], text pages welcome

## 2013-03-21 NOTE — Progress Notes (Signed)
FMTS Attending Daily Note: Jeffrey Jacober MD [phone] pager office [phone] I have discussed this patient with the resident and reviewed the assessment and plan as documented above. I agree wit the resident's findings and plan.  

## 2013-03-22 MED ORDER — IBUPROFEN 600 MG PO TABS
600.0000 mg | ORAL_TABLET | Freq: Four times a day (QID) | ORAL | Status: DC | PRN
  Administered 2013-03-22 – 2013-03-29 (×12): 600 mg via ORAL
  Filled 2013-03-22 (×14): qty 1

## 2013-03-22 NOTE — Progress Notes (Signed)
FMTS Attending Daily Note: Twilla Khouri MD [phone] pager office [phone] I have discussed this patient with the resident and reviewed the assessment and plan as documented above. I agree wit the resident's findings and plan.  

## 2013-03-22 NOTE — Progress Notes (Signed)
Family Medicine Teaching Service Daily Progress Note Intern Pager: [phone]  Patient name: Jeffrey [name] Medical record number: [id number] Date of birth: 1956-06-26 Age: 57 y.o. Gender: male  Primary Care Provider: No primary provider on file. Consultants: WOC, SW, PT/OT Code Status: DNR/DNI  Pt Overview and Major Events to Date:    Assessment and Plan: Jeffrey [name] is a 57 y.o. year old homeless male w/ h/o Hep C, Etoh abuse w/ cirrhosis, polysubstance abuse, HTN, bipolar, paraplegia, presenting with tremors and soft tissue infection.   # Substance Abuse: H/o polysubstance abuse with UDS positive for cocaine, THC, benzo on admission.  Last alcohol about 24 hours prior to admission; EtOH negative on admission.   - Etoh abuse counseling  - Nicotine patch (21mg )  - scheduled thiamine, folate and multivitamin [ ]  f/u social work for placement [ ]  continue to monitor for DT symptoms  # LRQ abdominal tenderness - tender for over a year. No leukocytosis or fever. Has had appendectomy 5 years ago.  # Skin Wounds/Infection: numerous open skin wounds, 2 of which appear infected and possibly cellulitic. All in various stages of healing. Pt endorses picking of mosquito bites on upper and lower extremities. LE extremity wound borders demarcated w/ skin marking pen. No signs of systemic illness. Recent Rx for Keflex on 02/27/13 was not filled. Day #10 of total abiotic treatment (7/17). - Doxy for cellulitis and presumed MRSA, stopped on day #7. - nursing to bandage various arm lesions to help deter pt from picking  - air overlay matress  - wound culture - Staph aureus, MSSA - wound care continuing to follow - continue keflex 500mg  QID for 7 days (day #3 - 7/17)  # CV: h/o HTN and positive for cocaine. Hypertensive on admission. No current signs of MI  - Tele  - Change home Metop to amlodipine 5mg  QD given h/o cocaine  - Hydralazine PRN   # ID: no signs of sepsis, but w/ h/o Hep C and  syphilis and active drug abuser. Cellulitis as above  - ABX as above  - HIV negative  # Psych: h/o depression and bipolar. Not taking any of his home medications. SI about 1 month ago but denies current SI/HI.  - Will wait to restart cymbalta at time of DC  - home Depakote.  - Home trazadone PRN QHS ordered   # MSK: paraplegic w/ muscle spasticity. Not taking home medications. Source of chronic pain for pt.  - PT/OT  - Start home Zanaflex  - home gabapentin 300mg  TID restarted yesterday  # FEN/GI: Tolerating PO but w/ stomach pain likely from withdrawal. 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  - IVF of NS 118ml/hr (taking PO so no banana bag)  - Regular diet  - Zofran PRN  - Senna and miralax  Prophylaxis: Hep SQ TID  Disposition: pending placement at SNF - OT recommends SNF for continued OT services - PT recommends SNF/Home health PT; wheelchair cushion  Subjective: Still having right leg pain. Also complaining of left knee pain from arthritis. No nausea, vomiting, hallucinations or palpitations.  Objective: Temp:  [97.8 F (36.6 C)-98.6 F (37 C)] 98.5 F (36.9 C) (07/17 0432) Pulse Rate:  [63-82] 63 (07/17 0432) Resp:  [18-20] 19 (07/17 0432) BP: (121-167)/(78-99) 136/78 mmHg (07/17 0432) SpO2:  [95 %-98 %] 95 % (07/17 0432)  Physical Exam: General: in bed, comfortable, in no acute distress CV: RRR, no murmurs Pulm:  CTAB, no wheeze Abd: soft, non distended, palpable liver 6cm below costal margin. Extremities: lesions on left leg bandaged: clean, dry and intact, also with multiple bandages and other wounds on legs, arms and buttock. Wounds under bandages appear to be healing well; no exudate Neuro: alert & oriented x3. No focal defects. Continues to have a mild tremor. having normal conversation  Laboratory:  Recent Labs Lab 03/19/13 0733 03/20/13 0815 03/21/13 0826  WBC 4.5 4.9 5.5  HGB 13.4 13.9 14.0   HCT 39.3 41.5 43.0  PLT 143* 146* 176    Recent Labs Lab 03/15/13 0741  03/16/13 0625  03/19/13 0733 03/20/13 0815 03/21/13 0826  NA 141  --  140  < > 136 139 137  K 3.7  --  3.1*  < > 3.7 3.4* 3.9  CL 104  --  105  < > 101 101 100  CO2 21  --  23  < > 27 25 25   BUN 10  --  9  < > 10 10 12   CREATININE 0.59  < > 0.58  < > 0.71 0.66 0.68  CALCIUM 9.2  --  8.3*  < > 8.9 9.1 9.3  PROT 7.6  --  6.3  --   --   --   --   BILITOT 0.4  --  0.3  --   --   --   --   ALKPHOS 164*  --  137*  --   --   --   --   ALT 8  --  7  --   --   --   --   AST 24  --  17  --   --   --   --   GLUCOSE 106*  --  94  < > 89 86 95  < > = values in this interval not displayed.  Jeffrey Hawking, MD 03/22/2013, 6:50 AM PGY-1, [facility] FPTS Intern pager: [phone], text pages welcome

## 2013-03-23 LAB — BASIC METABOLIC PANEL
BUN: 17 mg/dL (ref 6–23)
CO2: 28 mEq/L (ref 19–32)
Calcium: 9.6 mg/dL (ref 8.4–10.5)
Glucose, Bld: 85 mg/dL (ref 70–99)
Potassium: 4.1 mEq/L (ref 3.5–5.1)
Sodium: 140 mEq/L (ref 135–145)

## 2013-03-23 LAB — CBC
Hemoglobin: 14.6 g/dL (ref 13.0–17.0)
MCH: 29 pg (ref 26.0–34.0)
MCV: 85.5 fL (ref 78.0–100.0)
RBC: 5.03 MIL/uL (ref 4.22–5.81)

## 2013-03-23 NOTE — Clinical Social Work Note (Signed)
CSW has been unable to locate placement for patient and clinical social work Chiropodist was consulted on 7/17 regarding no bed offers to date within and outside of [facility]. CSW advised to contacted [facility]. Contact made with Pennie Rushing in admissions on 7/18 and after reviewing clinicals they were unable to make a bed offer. Charge nurse advised and patient updated on progress in locating a SNF bed.  Genelle Bal, MSW, LCSW [phone]

## 2013-03-23 NOTE — Progress Notes (Signed)
Occupational Therapy Treatment Patient Details Name: Jeffrey [name] MRN: [id number] DOB: 1956-03-06 Today's Date: 03/23/2013 Time: 4098-1191 OT Time Calculation (min): 18 min  OT Assessment / Plan / Recommendation  OT comments  Making progress but slowly due to poor pain management  Follow Up Recommendations  SNF       Equipment Recommendations  None recommended by OT       Frequency Min 2X/week   Progress towards OT Goals Progress towards OT goals: Progressing toward goals  Plan Discharge plan remains appropriate    Precautions / Restrictions Precautions Precautions: Fall Restrictions Weight Bearing Restrictions: No   Pertinent Vitals/Pain 10/10 pain in left hip    ADL  Lower Body Dressing: Performed;Supervision/safety;Set up (doff/donn socks with AE) Where Assessed - Lower Body Dressing: Unsupported sitting Equipment Used: Reacher;Sock aid ADL Comments: Pt reports that using the AE for LBADLS is easier to do then trying to cross his legs which causes increased pain      OT Goals(current goals can now be found in the care plan section)    Visit Information  Last OT Received On: 03/23/13 Assistance Needed: +1 (for bed level activities) History of Present Illness: Jeffrey [name] is a 57 y.o. year old homeless male w/ h/o Hep C, Etoh abuse w/ cirrhosis, polysubstance abuse, HTN, bipolar, paraplegia, presenting with tremors and soft tissue infection/multiple wounds. Pain management is a big issue as far as pt's ability to function          Cognition  Cognition Arousal/Alertness: Awake/alert Behavior During Therapy: Restless (due to pain) Overall Cognitive Status: Within Functional Limits for tasks assessed    Mobility  Bed Mobility Bed Mobility: Rolling Right;Rolling Left;Right Sidelying to Sit Rolling Right: 5: Supervision;With rail Rolling Left: 5: Supervision;With rail Right Sidelying to Sit: 5: Supervision;With rails;HOB flat          End of Session OT  - End of Session Activity Tolerance: Patient limited by pain Patient left: in bed;with call bell/phone within reach       Evette Georges [phone] 03/23/2013, 3:30 PM

## 2013-03-23 NOTE — Progress Notes (Signed)
Family Medicine Teaching Service Daily Progress Note Intern Pager: [phone]  Patient name: Jeffrey [name] Medical record number: [id number] Date of birth: 1956-04-30 Age: 57 y.o. Gender: male  Primary Care Provider: No primary provider on file. Consultants: WOC, SW, PT/OT Code Status: DNR/DNI  Pt Overview and Major Events to Date:    Assessment and Plan: Jeffrey [name] is a 57 y.o. year old homeless male w/ h/o Hep C, Etoh abuse w/ cirrhosis, polysubstance abuse, HTN, bipolar, paraplegia, presenting with tremors and soft tissue infection.   # Substance Abuse: H/o polysubstance abuse with UDS positive for cocaine, THC, benzo on admission.  Last alcohol about 24 hours prior to admission; EtOH negative on admission.   - Etoh abuse counseling  - Nicotine patch (21mg )  - scheduled thiamine, folate and multivitamin [ ]  f/u social work for placement [ ]  continue to monitor for DT symptoms  # RLQ abdominal tenderness - tender for over a year. No leukocytosis or fever. Has had appendectomy 5 years ago.  # Skin Wounds/Infection: numerous open skin wounds, 2 of which appear infected and possibly cellulitic. All in various stages of healing. Pt endorses picking of mosquito bites on upper and lower extremities. LE extremity wound borders demarcated w/ skin marking pen. No signs of systemic illness. Recent Rx for Keflex on 02/27/13 was not filled. Day #11 of total antibiotic treatment (7/18). - Doxy for cellulitis and presumed MRSA, stopped on day #7. - nursing to bandage various arm lesions to help deter pt from picking  - air overlay matress  - wound culture - Staph aureus, MSSA - wound care continuing to follow - continue keflex 500mg  QID for 7 days (day #4 - 7/18)  # CV: h/o HTN and positive for cocaine. Hypertensive on admission. No current signs of MI  - Tele  - continue amlodipine 5mg  QD given h/o cocaine  - Hydralazine PRN   # ID: no signs of sepsis, but w/ h/o Hep C and syphilis and  active drug abuser. Cellulitis as above  - ABX as above  - HIV negative  # Psych: h/o depression and bipolar. Not taking any of his home medications. SI about 1 month ago but denies current SI/HI.  - Will wait to restart cymbalta at time of DC  - home Depakote.  - Home trazadone PRN QHS ordered   # MSK: paraplegic w/ muscle spasticity. Not taking home medications. Source of chronic pain for pt.  - PT/OT rec SNF - Start home Zanaflex  - home gabapentin 300mg  TID restarted   # FEN/GI: Tolerating PO but w/ stomach pain likely from withdrawal. 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  - IVF of NS 165ml/hr (taking PO so no banana bag)  - Regular diet  - Zofran PRN  - Senna and miralax  Prophylaxis: Hep SQ TID  Disposition: pending placement at SNF - OT recommends SNF for continued OT services - PT recommends SNF/Home health PT; wheelchair cushion  Subjective: Still having right leg pain. Also complaining of left knee pain from arthritis. Tearful about lack of placement options and pain. No nausea, vomiting, hallucinations or palpitations.  Objective: Temp:  [97.7 F (36.5 C)-98.6 F (37 C)] 98.1 F (36.7 C) (07/18 0435) Pulse Rate:  [62-79] 74 (07/18 0435) Resp:  [20] 20 (07/18 0435) BP: (102-166)/(83-100) 102/83 mmHg (07/18 0435) SpO2:  [97 %-99 %] 97 % (07/18 0435) Weight:  [233 lb 14.5 oz (106.099 kg)] 233 lb  14.5 oz (106.099 kg) (07/17 2026)  Physical Exam: General: in bed, comfortable, in no acute distress CV: RRR, no murmurs Pulm: CTAB, no wheeze Abd: soft, non distended, palpable liver 6cm below costal margin. Extremities: lesions on left leg bandaged: clean, dry and intact, also with multiple bandages and other wounds on legs, arms and buttock. Wounds under bandages appear to be healing well; no exudate Neuro: alert & oriented x3. No focal defects. Continues to have a mild tremor. having normal  conversation  Laboratory:  Recent Labs Lab 03/20/13 0815 03/21/13 0826 03/23/13 0520  WBC 4.9 5.5 6.4  HGB 13.9 14.0 14.6  HCT 41.5 43.0 43.0  PLT 146* 176 230    Recent Labs Lab 03/20/13 0815 03/21/13 0826 03/23/13 0520  NA 139 137 140  K 3.4* 3.9 4.1  CL 101 100 101  CO2 25 25 28   BUN 10 12 17   CREATININE 0.66 0.68 0.82  CALCIUM 9.1 9.3 9.6  GLUCOSE 86 95 85    Beverely Low, MD 03/23/2013, 8:46 AM PGY-1, [facility] FPTS Intern pager: [phone], text pages welcome

## 2013-03-23 NOTE — Progress Notes (Signed)
Patient ZO:XWRU Lando      DOB: 1956/04/02      EAV:[id number]  Jeffrey [name] is a 57 yo WM, homeless with PMH Hep C, ETOH abuse, cirrhosis, polysubstance abuse, HTN, bipolar disorder, paraplegia. Presented to ER 7/10 with tremors and soft tissue infection. Currently social work unable to secure discharge plan for this patient.   Palliative Care has been consulted for symptom pain management. Discussed case with Dr Darolyn Rua, she feels palliative care is unable to safely prescribe opioid pain management without secured discharge plan.   Contacted Teaching Service physician- Dr Jarvis Newcomer  informed him of Dr Lamar Blinks recommendation. Told him to re-consult when discharge plan is secured and palliative care will be happy to see patient and make pain recommendations. Informed Dr Jarvis Newcomer current consult with be discontinued.  Jeffrey [name], CNS-C Palliative Medicine Team [facility] Team Phone: [phone] Pager: [phone]

## 2013-03-23 NOTE — Progress Notes (Signed)
Pt insist on having right lower bed rail down. Pt educated on safety and reason we keep on side rails up on air bed.  Pt still request and wants it down because he sits on side of bed to hang his legs and do ROM exercises.

## 2013-03-23 NOTE — Progress Notes (Addendum)
Thank you for consulting the Palliative Medicine Team at [facility] to meet your patient's and family's needs.   The reason that you asked [place] to see your patient is for symptom management.   We have not scheduled your patient for a consultation pending discussion with attending MD re: discharge planning.    Contact information: patient in room 6E 15  Your patient is able to participate.  Additional Narrative: pt homeless, paraplegia, ETOH and smoker.  [facility] RN - Palliative Medicine Team  Call [phone]

## 2013-03-23 NOTE — Progress Notes (Signed)
FMTS Attending Daily Note: Karolina Zamor MD [phone] pager office [phone] I  have seen and examined this patient, reviewed their chart. I have discussed this patient with the resident. I agree with the resident's findings, assessment and care plan. 

## 2013-03-23 NOTE — Progress Notes (Signed)
Physical Therapy Treatment Patient Details Name: Jeffrey [name] MRN: [id number] DOB: [birth year] Today's Date: 03/23/2013 Time: 7829-5621 PT Time Calculation (min): 24 min  PT Assessment / Plan / Recommendation  PT Comments   Pt c/o max bilateral knee pain and L hip pain and premedicated approx 1 hour prior to therapy.  Pt reports he performed exercises yesterday in recliner however this consisted of change in LE position for pain control and trying to keep feet separated so pt educated in exercises to perform in bed.  Also focused on attempting to increase hip and knee extension since pt usually rests in increased hip and knee flexion, educated on different positions to hold with goal of 15 sec depending on pain.   Follow Up Recommendations  SNF     Does the patient have the potential to tolerate intense rehabilitation     Barriers to Discharge        Equipment Recommendations  Wheelchair cushion (measurements PT)    Recommendations for Other Services    Frequency     Progress towards PT Goals Progress towards PT goals: Not progressing toward goals - comment (continues to self limit however did perform exercises)  Plan Current plan remains appropriate    Precautions / Restrictions Precautions Precautions: Fall Restrictions Weight Bearing Restrictions: No   Pertinent Vitals/Pain 10/10 L hip and bilateral knees, premedicated    Mobility  Bed Mobility Bed Mobility: Not assessed Details for Bed Mobility Assistance: pt declined sitting EOB however moving around bed easily with rails due reposition for pain control    Exercises General Exercises - Lower Extremity Ankle Circles/Pumps: AROM;20 reps;Both Gluteal Sets: Other (comment) (explained however pt reports unable) Short Arc Quad: AROM;Other (comment);15 reps;Both (used foot of bed in bent position and pt extended knees) Heel Slides: Sidelying;10 reps;Both;Other (comment) (performed sidelying for pain tolerance) Hip  ABduction/ADduction: AROM;AAROM;Limitations;Both;10 reps Hip Abduction/Adduction Limitations: assist required for L LE    PT Diagnosis:    PT Problem List:   PT Treatment Interventions:     PT Goals (current goals can now be found in the care plan section)    Visit Information  Last PT Received On: 03/23/13 Assistance Needed: +2 History of Present Illness: Jeffrey [name] is a 57 y.o. year old homeless male w/ h/o Hep C, Etoh abuse w/ cirrhosis, polysubstance abuse, HTN, bipolar, paraplegia, presenting with tremors and soft tissue infection/multiple wounds     Subjective Data      Cognition  Cognition Arousal/Alertness: Awake/alert Behavior During Therapy:  (c/o increased pain, poor pain management)    Balance     End of Session PT - End of Session Activity Tolerance: Patient limited by pain Patient left: in bed;with call bell/phone within reach Nurse Communication: Patient requests pain meds   GP     Jeffrey [name],Jeffrey [name] 03/23/2013, 11:14 AM Jeffrey [name], PT, DPT 03/23/2013 Pager: [phone]

## 2013-03-24 NOTE — Progress Notes (Signed)
Family Medicine Teaching Service Daily Progress Note Intern Pager: [phone]  Patient name: Jeffrey [name] Medical record number: [id number] Date of birth: Nov 04, 1955 Age: 57 y.o. Gender: male  Primary Care Provider: No primary provider on file. Consultants: WOC, SW, PT/OT Code Status: DNR/DNI  Pt Overview and Major Events to Date:    Assessment and Plan: Jeffrey [name] is a 57 y.o. year old homeless male w/ h/o Hep C, Etoh abuse w/ cirrhosis, polysubstance abuse, HTN, bipolar, paraplegia, presenting with tremors and soft tissue infection.   1. Substance Abuse: H/o polysubstance abuse with UDS positive for cocaine, THC, benzo on admission.  Last alcohol about 24 hours prior to admission; EtOH negative on admission.   - Etoh abuse counseling  - Nicotine patch (21mg )  - scheduled thiamine, folate and multivitamin [ ]  f/u social work for placement [ ]  continue to monitor for DT symptoms  2. RLQ abdominal tenderness - tender for over a year. No leukocytosis or fever. Has had appendectomy 5 years ago.  3. Skin Wounds/Infection: numerous open skin wounds, 2 of which appear infected and possibly cellulitic. All in various stages of healing. Pt endorses picking of mosquito bites on upper and lower extremities. LE extremity wound borders demarcated w/ skin marking pen. No signs of systemic illness. Recent Rx for Keflex on 02/27/13 was not filled. Day #12 of total antibiotic treatment (7/19). - doxycycline stopped on day #7. - nursing to bandage various arm lesions to help deter pt from picking  - air overlay matress  - wound culture - Staph aureus, MSSA - wound care continuing to follow - continue keflex 500mg  QID for 7 days (day #5 - 7/19)  4. CV: h/o HTN and positive for cocaine. Hypertensive on admission. No current signs of MI  - continue amlodipine 5mg  QD given h/o cocaine  - Hydralazine PRN   5. ID: no signs of sepsis, but w/ h/o Hep C and syphilis and active drug abuser. Cellulitis  as above  - ABX as above  - HIV negative  6. Psych: h/o depression and bipolar. Not taking any of his home medications. SI about 1 month ago but denies current SI/HI.  - Will wait to restart cymbalta at time of DC  - home Depakote.  - Home trazadone PRN QHS ordered   7. MSK: paraplegic w/ muscle spasticity. Not taking home medications. Source of chronic pain for pt.  - PT/OT rec SNF - Start home Zanaflex  - home gabapentin 300mg  TID restarted   8. FEN/GI: Tolerating PO but w/ stomach pain likely from withdrawal. 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  - IVF of NS 160ml/hr (taking PO so no banana bag)  - Regular diet  - Zofran PRN  - Senna and miralax  Prophylaxis: Hep SQ TID  Disposition: pending placement at SNF - OT recommends SNF for continued OT services - PT recommends SNF/Home health PT; wheelchair cushion  Subjective: No complaints overnight, however, says he continues to have knee pain. However, currently, he is in no pain. No nausea, vomiting, hallucinations or palpitations.  Objective: Temp:  [97.7 F (36.5 C)-98.7 F (37.1 C)] 97.7 F (36.5 C) (07/19 0500) Pulse Rate:  [71-76] 76 (07/18 2100) Resp:  [20] 20 (07/19 0500) BP: (117-155)/(76-105) 117/76 mmHg (07/19 0500) SpO2:  [95 %-98 %] 95 % (07/19 0500) Weight:  [233 lb 11 oz (106 kg)] 233 lb 11 oz (106 kg) (07/18 2100)  Physical Exam: General: in  bed, comfortable, in no acute distress CV: RRR, no murmurs Pulm: CTAB, no wheeze Abd: soft, non distended, palpable liver 6cm below costal margin. Extremities: lesions on left leg bandaged: clean, dry and intact, also with multiple bandages and other wounds on legs, arms and buttock. Neuro: alert & oriented x3. No focal defects. Continues to have a mild tremor. having normal conversation  Laboratory:  Recent Labs Lab 03/20/13 0815 03/21/13 0826 03/23/13 0520  WBC 4.9 5.5 6.4  HGB 13.9 14.0 14.6  HCT  41.5 43.0 43.0  PLT 146* 176 230    Recent Labs Lab 03/20/13 0815 03/21/13 0826 03/23/13 0520  NA 139 137 140  K 3.4* 3.9 4.1  CL 101 100 101  CO2 25 25 28   BUN 10 12 17   CREATININE 0.66 0.68 0.82  CALCIUM 9.1 9.3 9.6  GLUCOSE 86 95 85    Jacquelin Hawking, MD 03/24/2013, 9:37 AM PGY-1, [place] [facility] FPTS Intern pager: [phone], text pages welcome

## 2013-03-24 NOTE — Progress Notes (Signed)
FMTS Attending Daily Note: Jeffrey Wahler MD [phone] pager office [phone] I  have seen and examined this patient, reviewed their chart. I have discussed this patient with the resident. I agree with the resident's findings, assessment and care plan. 

## 2013-03-25 NOTE — Progress Notes (Signed)
Family Medicine Teaching Service Daily Progress Note Intern Pager: [phone]  Patient name: Jeffrey [name] Medical record number: [id number] Date of birth: [birth year] Age: 57 y.o. Gender: male  Primary Care Provider: No primary provider on file. Consultants: WOC, SW, PT/OT Code Status: DNR/DNI  Pt Overview and Major Events to Date:    Assessment and Plan: Jeffrey [name] is a 57 y.o. year old homeless male w/ h/o Hep C, Etoh abuse w/ cirrhosis, polysubstance abuse, HTN, bipolar, paraplegia, presenting with tremors and soft tissue infection.   1. Substance Abuse: H/o polysubstance abuse with UDS positive for cocaine, THC, benzo on admission.  Last alcohol about 24 hours prior to admission; EtOH negative on admission.   - Etoh abuse counseling  - Nicotine patch (21mg )  - scheduled thiamine, folate and multivitamin [ ]  f/u social work for placement [ ]  continue to monitor for DT symptoms  2. RLQ abdominal tenderness - tender for over a year. No leukocytosis or fever. Has had appendectomy 5 years ago.  3. Skin Wounds/Infection: numerous open skin wounds, 2 of which appear infected and possibly cellulitic. All in various stages of healing. Pt endorses picking of mosquito bites on upper and lower extremities. LE extremity wound borders demarcated w/ skin marking pen. No signs of systemic illness. Recent Rx for Keflex on 02/27/13 was not filled. Day #13 of total antibiotic treatment (7/20). - doxycycline stopped on day #7. - nursing to bandage various arm lesions to help deter pt from picking  - air overlay matress  - wound culture - Staph aureus, MSSA - wound care continuing to follow - continue keflex 500mg  QID for 7 days (day #5 - 7/20)  4. CV: h/o HTN and positive for cocaine. Hypertensive on admission. No current signs of MI  - continue amlodipine 5mg  QD given h/o cocaine  - Hydralazine PRN   5. ID: no signs of sepsis, but w/ h/o Hep C and syphilis and active drug abuser. Cellulitis  as above  - ABX as above  - HIV negative  6. Psych: h/o depression and bipolar. Not taking any of his home medications. SI about 1 month ago but denies current SI/HI.  - Will wait to restart cymbalta at time of DC  - home Depakote.  - Home trazadone PRN QHS ordered   7. MSK: paraplegic w/ muscle spasticity. Not taking home medications. Source of chronic pain for pt.  - PT/OT rec SNF - continue home Zanaflex  - continuegabapentin 300mg  TID  8. FEN/GI: Tolerating PO but w/ stomach pain likely from withdrawal. 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  - IVF of NS 15ml/hr (taking PO so no banana bag)  - Regular diet  - Zofran PRN  - Senna and miralax  Prophylaxis: Hep SQ TID  Disposition: pending placement at SNF - OT recommends SNF for continued OT services - PT recommends SNF/Home health PT; wheelchair cushion  Subjective: No complaints overnight.  Objective: Temp:  [97.6 F (36.4 C)-98.6 F (37 C)] 97.8 F (36.6 C) (07/20 0519) Pulse Rate:  [60-77] 66 (07/20 0519) Resp:  [18] 18 (07/20 0519) BP: (106-157)/(68-90) 106/68 mmHg (07/20 0519) SpO2:  [97 %-100 %] 100 % (07/20 0519) Weight:  [233 lb 6.4 oz (105.87 kg)] 233 lb 6.4 oz (105.87 kg) (07/19 2221)  Physical Exam: General: in bed, comfortable, in no acute distress CV: RRR, no murmurs Pulm: CTAB, no wheeze Abd: soft, non distended, palpable liver 6cm below costal margin.  Extremities: lesions on left leg bandaged: clean, dry and intact, also with multiple bandages and other wounds on legs, arms and buttock. Neuro: alert & oriented x3. No focal defects. Continues to have a mild tremor. having normal conversation  Laboratory:  Recent Labs Lab 03/20/13 0815 03/21/13 0826 03/23/13 0520  WBC 4.9 5.5 6.4  HGB 13.9 14.0 14.6  HCT 41.5 43.0 43.0  PLT 146* 176 230    Recent Labs Lab 03/20/13 0815 03/21/13 0826 03/23/13 0520  NA 139 137 140  K 3.4* 3.9 4.1   CL 101 100 101  CO2 25 25 28   BUN 10 12 17   CREATININE 0.66 0.68 0.82  CALCIUM 9.1 9.3 9.6  GLUCOSE 86 95 85    Jacquelin Hawking, MD 03/25/2013, 8:23 AM PGY-1, [facility] FPTS Intern pager: [phone], text pages welcome

## 2013-03-25 NOTE — Progress Notes (Signed)
FMTS Attending Daily Note: Sadae Arrazola MD [phone] pager office [phone] I  have seen and examined this patient, reviewed their chart. I have discussed this patient with the resident. I agree with the resident's findings, assessment and care plan. 

## 2013-03-26 LAB — BASIC METABOLIC PANEL
CO2: 24 mEq/L (ref 19–32)
Calcium: 9.8 mg/dL (ref 8.4–10.5)
Creatinine, Ser: 1.05 mg/dL (ref 0.50–1.35)
Glucose, Bld: 94 mg/dL (ref 70–99)

## 2013-03-26 LAB — CBC
MCH: 27.7 pg (ref 26.0–34.0)
MCHC: 32 g/dL (ref 30.0–36.0)
MCV: 86.8 fL (ref 78.0–100.0)
Platelets: 286 10*3/uL (ref 150–400)
RDW: 16.7 % — ABNORMAL HIGH (ref 11.5–15.5)

## 2013-03-26 MED ORDER — GABAPENTIN 300 MG PO CAPS
600.0000 mg | ORAL_CAPSULE | Freq: Three times a day (TID) | ORAL | Status: DC
  Administered 2013-03-26 – 2013-03-30 (×13): 600 mg via ORAL
  Filled 2013-03-26 (×14): qty 2

## 2013-03-26 NOTE — Progress Notes (Signed)
03/26/2013 12:46 PM  Pt refused to have all side rails on the air bed up for safety, despite education attempt by this RN.  Pt BP at time of assessment 94 systolically, with norvasc due.  MD on call notified, orders received to hold this dose or norvasc and monitor blood pressure. Eunice Blase

## 2013-03-26 NOTE — Progress Notes (Signed)
Pt refusing to have all side rails up. Pt educated and aware of safety reasons. Pt still wanting right lower side rail down. Will continue to monitor.

## 2013-03-26 NOTE — Progress Notes (Signed)
Family Medicine Teaching Service Daily Progress Note Intern Pager: [phone]  Patient name: Jeffrey [name] Medical record number: [id number] Date of birth: 1956-07-09 Age: 57 y.o. Gender: male  Primary Care Provider: No primary provider on file. Consultants: WOC, SW, PT/OT Code Status: DNR/DNI  Pt Overview and Major Events to Date:    Assessment and Plan: Aztlan Coll is a 57 y.o. year old homeless male w/ h/o Hep C, Etoh abuse w/ cirrhosis, polysubstance abuse, HTN, bipolar, paraplegia, presenting with tremors and soft tissue infection.   1. Substance Abuse: H/o polysubstance abuse with UDS positive for cocaine, THC, benzo on admission.  Last alcohol about 24 hours prior to admission; EtOH negative on admission.   - Etoh abuse counseling  - Nicotine patch (21mg )  - scheduled thiamine, folate and multivitamin [ ]  f/u social work for placement  2. RLQ abdominal tenderness - tender for over a year. No leukocytosis or fever. Has had appendectomy 5 years ago.  3. Skin Wounds/Infection: numerous open skin wounds, 2 of which appear infected and possibly cellulitic. All in various stages of healing. Pt endorses picking of mosquito bites on upper and lower extremities. LE extremity wound borders demarcated w/ skin marking pen. No signs of systemic illness. Recent Rx for Keflex on 02/27/13 was not filled. Day #14 of total antibiotic treatment (7/21). - doxycycline stopped on day #7. - nursing to bandage various arm lesions to help deter pt from picking  - air overlay matress  - wound culture - Staph aureus, MSSA - continue keflex 500mg  QID for 7 days (day #6 - 7/21)  4. CV: h/o HTN and positive for cocaine. Hypertensive on admission. No current signs of MI  - continue amlodipine 5mg  QD given h/o cocaine  - Hydralazine PRN   5. ID: no signs of sepsis, but w/ h/o Hep C and syphilis and active drug abuser. Cellulitis as above  - ABX as above  - HIV negative  6. Psych: h/o depression and  bipolar. Not taking any of his home medications. SI about 1 month ago but denies current SI/HI.  - Will wait to restart cymbalta at time of DC  - home Depakote.  - Home trazadone PRN QHS ordered   7. MSK: paraplegic w/ muscle spasticity. Not taking home medications. Source of chronic pain for pt.  - PT/OT rec SNF - continue home Zanaflex  - continuegabapentin 300mg  TID  8. FEN/GI: Tolerating PO but w/ stomach pain likely from withdrawal. 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  - IVF of NS 188ml/hr (taking PO so no banana bag)  - Regular diet  - Zofran PRN  - Senna and miralax  Prophylaxis: Hep SQ TID  Disposition: pending placement at SNF - OT recommends SNF for continued OT services - PT recommends SNF/Home health PT; wheelchair cushion  Subjective: Complaints of pain when he sits down from his right buttock. Otherwise, still complaining of pain from arthritis that comes and goes. No events over night.  Objective: Temp:  [97.6 F (36.4 C)-98.8 F (37.1 C)] 97.6 F (36.4 C) (07/21 0518) Pulse Rate:  [67-83] 69 (07/21 0518) Resp:  [12-20] 12 (07/21 0518) BP: (111-141)/(72-96) 126/90 mmHg (07/21 0518) SpO2:  [94 %-99 %] 95 % (07/21 0518) Weight:  [233 lb 14.5 oz (106.1 kg)] 233 lb 14.5 oz (106.1 kg) (07/20 2024)  Physical Exam: General: in bed, comfortable, in no acute distress CV: RRR, no murmurs Pulm: CTAB, no wheeze Abd:  soft, non distended, palpable liver 6cm below costal margin. Extremities: lesions on left leg bandaged: clean, dry and intact, also with multiple bandages and other wounds on legs, arms and buttock. Musculoskeletal: No tenderness or erythema on right ischium, muscle appears to be wasting on right buttock Neuro: alert & oriented x3.  Laboratory:  Recent Labs Lab 03/21/13 0826 03/23/13 0520 03/26/13 0445  WBC 5.5 6.4 5.6  HGB 14.0 14.6 13.9  HCT 43.0 43.0 43.5  PLT 176 230 286    Recent  Labs Lab 03/21/13 0826 03/23/13 0520 03/26/13 0445  NA 137 140 135  K 3.9 4.1 3.9  CL 100 101 97  CO2 25 28 24   BUN 12 17 23   CREATININE 0.68 0.82 1.05  CALCIUM 9.3 9.6 9.8  GLUCOSE 95 85 94    Jacquelin Hawking, MD 03/26/2013, 7:04 AM PGY-1, [facility] FPTS Intern pager: [phone], text pages welcome
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
## 2013-03-26 NOTE — Progress Notes (Signed)
Seen and examined.  Medically stable.  Awaiting placement.

## 2013-03-27 DIAGNOSIS — M51379 Other intervertebral disc degeneration, lumbosacral region without mention of lumbar back pain or lower extremity pain: Secondary | ICD-10-CM

## 2013-03-27 DIAGNOSIS — M5137 Other intervertebral disc degeneration, lumbosacral region: Secondary | ICD-10-CM

## 2013-03-27 DIAGNOSIS — M48061 Spinal stenosis, lumbar region without neurogenic claudication: Secondary | ICD-10-CM

## 2013-03-27 MED ORDER — BACITRACIN-NEOMYCIN-POLYMYXIN OINTMENT TUBE
TOPICAL_OINTMENT | CUTANEOUS | Status: DC | PRN
  Administered 2013-03-28: 12:00:00 via TOPICAL
  Filled 2013-03-27 (×2): qty 15

## 2013-03-27 NOTE — Progress Notes (Signed)
PT Cancellation Note  Patient Details Name: Jeffrey [name] MRN: [id number] DOB: 16-Apr-1956   Cancelled Treatment:    Reason Eval/Treat Not Completed: Patient at procedure or test/unavailable;Pain limiting ability to participate Pt refusing OT earlier and now with speaking with staff member at bedside.   [name],[name] 03/27/2013, 3:39 PM Zenovia Jarred, PT, DPT 03/27/2013 Pager: [phone]

## 2013-03-27 NOTE — Progress Notes (Signed)
OT Cancellation Note  Patient Details Name: Jeffrey [name] MRN: [id number] DOB: [birth year]   Cancelled Treatment:    Reason Eval/Treat Not Completed: Patient declined, no reason specified                                           Attempted skilled o.t. With various suggestions of possible tx. Options.  Pt. Remained with eyes tightly closed and cont. To decline all attempts.  Noted increased agitation with                                           con't. Attempts.  Will try back if/as time permits.  Pt. States "i am in so much pain i have arthritis in my back you know"  Robet Leu, COTA/L 03/27/2013, 12:03 PM
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
## 2013-03-27 NOTE — Progress Notes (Signed)
Seen and examined.  Medically stable as per Dr. Caleb Popp.

## 2013-03-27 NOTE — Clinical Social Work Note (Signed)
CSW continuing to seek placement for patient. Contact being made with a group home regarding possible placement for patient.  Genelle Bal, MSW, LCSW [phone]

## 2013-03-27 NOTE — Progress Notes (Signed)
Family Medicine Teaching Service Daily Progress Note Intern Pager: [phone]  Patient name: Jeffrey [name] Medical record number: [id number] Date of birth: 1955-11-18 Age: 57 y.o. Gender: male  Primary Care Provider: No primary provider on file. Consultants: WOC, SW, PT/OT Code Status: DNR/DNI  Pt Overview and Major Events to Date:    Assessment and Plan: Jeffrey [name] is a 57 y.o. year old homeless male w/ h/o Hep C, Etoh abuse w/ cirrhosis, polysubstance abuse, HTN, bipolar, paraplegia, presenting with tremors and soft tissue infection.   1. Substance Abuse: H/o polysubstance abuse with UDS positive for cocaine, THC, benzo on admission.  Last alcohol about 24 hours prior to admission; EtOH negative on admission.   - Etoh abuse counseling  - Nicotine patch (21mg )  - scheduled thiamine, folate and multivitamin [ ]  f/u social work for placement  2. RLQ abdominal tenderness - tender for over a year. No leukocytosis or fever. Has had appendectomy 5 years ago.  3. Skin Wounds/Infection: numerous open skin wounds, 2 of which appear infected and possibly cellulitic. All in various stages of healing. Pt endorses picking of mosquito bites on upper and lower extremities. LE extremity wound borders demarcated w/ skin marking pen. No signs of systemic illness. Recent Rx for Keflex on 02/27/13 was not filled. Day #15 of total antibiotic treatment (7/22). - doxycycline stopped on day #7. - nursing to bandage various arm lesions to help deter pt from picking  - air overlay matress  - wound culture - Staph aureus, MSSA - continue keflex 500mg  QID for 7 days (day #7 - 7/22)  4. CV: h/o HTN and positive for cocaine. Hypertensive on admission. No current signs of MI  - continue amlodipine 5mg  QD given h/o cocaine  - Hydralazine PRN   5. ID: no signs of sepsis, but w/ h/o Hep C and syphilis and active drug abuser. Cellulitis as above  - ABX as above  - HIV negative  6. Psych: h/o depression and  bipolar. Not taking any of his home medications. SI about 1 month ago but denies current SI/HI.  - Will wait to restart cymbalta at time of DC  - home Depakote.  - Home trazadone PRN QHS ordered   7. MSK: paraplegic w/ muscle spasticity. Not taking home medications. Source of chronic pain for pt.  - PT/OT rec SNF - continue home Zanaflex  - continuegabapentin 300mg  TID  8. FEN/GI: Tolerating PO but w/ stomach pain likely from withdrawal. 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  - IVF of NS 183ml/hr (taking PO so no banana bag)  - Regular diet  - Zofran PRN  - Senna and miralax  Prophylaxis: Hep SQ TID  Disposition: pending placement at SNF - OT recommends SNF for continued OT services - PT recommends SNF/Home health PT; wheelchair cushion  Subjective: No complaints over night. Eagerly waiting placement.  Objective: Temp:  [97.5 F (36.4 C)-98.8 F (37.1 C)] 98.2 F (36.8 C) (07/22 1000) Pulse Rate:  [65-88] 88 (07/22 1000) Resp:  [14-18] 16 (07/22 1000) BP: (100-153)/(67-102) 126/76 mmHg (07/22 1000) SpO2:  [97 %-100 %] 98 % (07/22 1000) Weight:  [233 lb 14.5 oz (106.1 kg)] 233 lb 14.5 oz (106.1 kg) (07/21 2100)  Physical Exam: General: in bed, comfortable, in no acute distress CV: RRR, no murmurs Pulm: CTAB, no wheeze Abd: soft, non distended, palpable liver 6cm below costal margin. Extremities: lesions on left leg bandaged: clean, dry and intact,  also with multiple bandages and other wounds on legs, arms and buttock. Musculoskeletal: No tenderness or erythema on right ischium, muscle appears to be wasting on right buttock Neuro: alert & oriented x3.  Laboratory:  Recent Labs Lab 03/21/13 0826 03/23/13 0520 03/26/13 0445  WBC 5.5 6.4 5.6  HGB 14.0 14.6 13.9  HCT 43.0 43.0 43.5  PLT 176 230 286    Recent Labs Lab 03/21/13 0826 03/23/13 0520 03/26/13 0445  NA 137 140 135  K 3.9 4.1 3.9  CL 100 101 97   CO2 25 28 24   BUN 12 17 23   CREATININE 0.68 0.82 1.05  CALCIUM 9.3 9.6 9.8  GLUCOSE 95 85 94    Jacquelin Hawking, MD 03/27/2013, 12:04 PM PGY-1, [facility] Family Medicine FPTS Intern pager: [phone], text pages welcome

## 2013-03-28 DIAGNOSIS — L8990 Pressure ulcer of unspecified site, unspecified stage: Secondary | ICD-10-CM

## 2013-03-28 DIAGNOSIS — J9600 Acute respiratory failure, unspecified whether with hypoxia or hypercapnia: Secondary | ICD-10-CM

## 2013-03-28 NOTE — Clinical Social Work Note (Signed)
CSW contacted Ms. O'Keke with [facility] regarding patient, however all of her beds are full. CSW contacted admissions directors with [facility] and [facility] regarding reviewing patient's clinicals for placement. Information sent to both facilities and to SNF's in [facility].  Genelle Bal, MSW, LCSW [phone]

## 2013-03-28 NOTE — Progress Notes (Signed)
Seen and examined.  Medically stable.  Awaiting firm dispo plan.  I would like to not DC patient to the street.

## 2013-03-28 NOTE — Progress Notes (Signed)
Family Medicine Teaching Service Daily Progress Note Intern Pager: [phone]  Patient name: Jeffrey [name] Medical record number: [id number] Date of birth: Oct 18, 1955 Age: 57 y.o. Gender: male  Primary Care Provider: No primary provider on file. Consultants: WOC, SW, PT/OT Code Status: DNR/DNI  Pt Overview and Major Events to Date:    Assessment and Plan: Jeffrey [name] is a 57 y.o. year old homeless male w/ h/o Hep C, Etoh abuse w/ cirrhosis, polysubstance abuse, HTN, bipolar, paraplegia, presenting with tremors and soft tissue infection.   1. Substance Abuse: H/o polysubstance abuse with UDS positive for cocaine, THC, benzo on admission.  Last alcohol about 24 hours prior to admission; EtOH negative on admission.   - Etoh abuse counseling  - Nicotine patch (21mg )  - scheduled thiamine, folate and multivitamin [ ]  f/u social work for placement  2. RLQ abdominal tenderness - tender for over a year. No leukocytosis or fever. Has had appendectomy 5 years ago.  3. Skin Wounds/Infection: numerous open skin wounds, 2 of which appear infected and possibly cellulitic. All in various stages of healing. Pt endorses picking of mosquito bites on upper and lower extremities. LE extremity wound borders demarcated w/ skin marking pen. No signs of systemic illness. Recent Rx for Keflex on 02/27/13 was not filled. Day #16 of total antibiotic treatment (7/23). - doxycycline stopped on day #7. - nursing to bandage various arm lesions to help deter pt from picking  - air overlay matress  - wound culture - Staph aureus, MSSA - Last day of Keflex  4. CV: h/o HTN and positive for cocaine. Hypertensive on admission. No current signs of MI  - continue amlodipine 5mg  QD given h/o cocaine  - Hydralazine PRN   5. ID: no signs of sepsis, but w/ h/o Hep C and syphilis and active drug abuser. Cellulitis as above  - ABX as above  - HIV negative  6. Psych: h/o depression and bipolar. Not taking any of his home  medications. SI about 1 month ago but denies current SI/HI.  - Will wait to restart cymbalta at time of DC  - home Depakote.  - Home trazadone PRN QHS ordered   7. MSK: paraplegic w/ muscle spasticity. Not taking home medications. Source of chronic pain for pt.  - PT/OT rec SNF - continue home Zanaflex  - continuegabapentin 300mg  TID  8. FEN/GI: Tolerating PO but w/ stomach pain likely from withdrawal. 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  - Regular diet  - Zofran PRN  - Senna and miralax   Prophylaxis: Hep SQ TID  Disposition: pending placement at group home - OT recommends SNF for continued OT services - PT recommends SNF/Home health PT; wheelchair cushion  Subjective: No complaints over night. Eagerly waiting placement.  Objective: Temp:  [97.6 F (36.4 C)-98 F (36.7 C)] 98 F (36.7 C) (07/23 0900) Pulse Rate:  [59-76] 68 (07/23 0900) Resp:  [18-20] 18 (07/23 0900) BP: (124-141)/(71-87) 124/71 mmHg (07/23 0900) SpO2:  [95 %-98 %] 96 % (07/23 0900) Weight:  [233 lb 6.4 oz (105.87 kg)] 233 lb 6.4 oz (105.87 kg) (07/22 2030)  Physical Exam: General: in bed, sleepy, in no acute distress CV: RRR, no murmurs Pulm: CTAB, no wheeze Abd: soft, non distended, palpable liver 6cm below costal margin. Extremities: lesions on left leg bandaged: clean, dry and intact, also with multiple bandages and other wounds on legs, arms and buttock. Neuro: alert & oriented x3.  Laboratory:  Recent Labs Lab 03/23/13 0520 03/26/13 0445  WBC 6.4 5.6  HGB 14.6 13.9  HCT 43.0 43.5  PLT 230 286    Recent Labs Lab 03/23/13 0520 03/26/13 0445  NA 140 135  K 4.1 3.9  CL 101 97  CO2 28 24  BUN 17 23  CREATININE 0.82 1.05  CALCIUM 9.6 9.8  GLUCOSE 85 94    Jacquelin Hawking, MD 03/28/2013, 2:18 PM PGY-1, [facility] Family Medicine FPTS Intern pager: [phone], text pages welcome

## 2013-03-28 NOTE — Progress Notes (Signed)
Pt refusing to have bottom side rail up. Pt made aware and education reinforced reguarding safety measures and reason to have it up. Pt still refusing to have side rail up.

## 2013-03-29 NOTE — Progress Notes (Signed)
Seen and examine.  Agree with Dr. Caleb Popp.  Medically stable.  Awaiting SNF.

## 2013-03-29 NOTE — Progress Notes (Signed)
Family Medicine Teaching Service Daily Progress Note Intern Pager: [phone]  Patient name: Jeffrey [name] Medical record number: [id number] Date of birth: 01-25-1956 Age: 57 y.o. Gender: male  Primary Care Provider: No primary provider on file. Consultants: WOC, SW, PT/OT Code Status: DNR/DNI  Pt Overview and Major Events to Date:    Assessment and Plan: Jeffrey [name] is a 57 y.o. year old homeless male w/ h/o Hep C, Etoh abuse w/ cirrhosis, polysubstance abuse, HTN, bipolar, paraplegia, presenting with tremors and soft tissue infection.   1. Substance Abuse: H/o polysubstance abuse with UDS positive for cocaine, THC, benzo on admission.  Last alcohol about 24 hours prior to admission; EtOH negative on admission.   - Etoh abuse counseling  - Nicotine patch (21mg )  - scheduled thiamine, folate and multivitamin [ ]  f/u social work for placement  2. RLQ abdominal tenderness - tender for over a year. No leukocytosis or fever. Has had appendectomy 5 years ago.  3. Skin Wounds/Infection: numerous open skin wounds, 2 of which appear infected and possibly cellulitic. All in various stages of healing. Pt endorses picking of mosquito bites on upper and lower extremities. LE extremity wound borders demarcated w/ skin marking pen. No signs of systemic illness. Recent Rx for Keflex on 02/27/13 was not filled. Last day of total antibiotic treatment (7/23). - doxycycline stopped on day #7. - nursing to bandage various arm lesions to help deter pt from picking  - air overlay matress  - wound culture - Staph aureus, MSSA - last dose of keflex received on 7/23  4. CV: h/o HTN and positive for cocaine. Hypertensive on admission. No current signs of MI  - continue amlodipine 5mg  QD given h/o cocaine  - Hydralazine PRN   5. ID: no signs of sepsis, but w/ h/o Hep C and syphilis and active drug abuser. Cellulitis as above  - ABX as above  - HIV negative  6. Psych: h/o depression and bipolar. Not  taking any of his home medications. SI about 1 month ago but denies current SI/HI.  - Will wait to restart cymbalta at time of DC  - home Depakote.  - Home trazadone PRN QHS ordered   7. MSK: paraplegic w/ muscle spasticity. Not taking home medications. Source of chronic pain for pt.  - PT/OT rec SNF - continue home Zanaflex  - continuegabapentin 300mg  TID  8. FEN/GI: Tolerating PO but w/ stomach pain likely from withdrawal. 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  - Regular diet  - Zofran PRN  - Senna and miralax   Prophylaxis: Hep SQ TID  Disposition: pending placement at group home or possible SNF - OT recommends SNF for continued OT services - PT recommends SNF/Home health PT; wheelchair cushion  Subjective: No complaints overnight.  Objective: Temp:  [97.6 F (36.4 C)-98 F (36.7 C)] 97.6 F (36.4 C) (07/24 0520) Pulse Rate:  [56-72] 56 (07/24 0520) Resp:  [16-18] 18 (07/24 0520) BP: (112-135)/(70-89) 112/70 mmHg (07/24 0520) SpO2:  [96 %-98 %] 98 % (07/24 0520) Weight:  [233 lb 14.5 oz (106.1 kg)] 233 lb 14.5 oz (106.1 kg) (07/23 2045)  Physical Exam: General: laying in bed, sleeping, in no acute distress CV: RRR, no murmurs Pulm: CTAB, no wheeze Abd: soft, non distended, palpable liver 6cm below costal margin. Extremities: multiple lesions on arms from picking. None look infected Neuro: alert & oriented x3, having regular conversation  Laboratory:  Recent Labs Lab  03/23/13 0520 03/26/13 0445  WBC 6.4 5.6  HGB 14.6 13.9  HCT 43.0 43.5  PLT 230 286    Recent Labs Lab 03/23/13 0520 03/26/13 0445  NA 140 135  K 4.1 3.9  CL 101 97  CO2 28 24  BUN 17 23  CREATININE 0.82 1.05  CALCIUM 9.6 9.8  GLUCOSE 85 94    Jacquelin Hawking, MD 03/29/2013, 7:25 AM PGY-1, [facility] Family Medicine FPTS Intern pager: [phone], text pages welcome

## 2013-03-29 NOTE — Progress Notes (Signed)
Follow-up session with pt. Pt appeared alert and oriented and was awake when I visited. Pt discussed his career history and pt's strengths were identified and highlighted. Pt explored resources of strength including his spirituality. Pt described his spiritual experiences and how they served and continue to serve as strength for him. Pt identified several roadblocks he has encountered in the past, and his ongoing struggle with pain resulting from his physical ailments. Pt discussed his concerns upon discharge, especially relating to his personal safety concerns. Pt identified a hobby that he enjoyed in the past and continues to enjoy. Pt explored the possibility of engaging in his hobby in the future. Pt indicated appreciation and gratitude for my visit. Pt and I discussed termination.   Sherol Dade Counselor Intern Haroldine Laws

## 2013-03-29 NOTE — Clinical Social Work Note (Addendum)
CSW consulted with Dr. Jacky Kindle regarding assistance with SNF placment for patient as a facility has not been located that will accept him. CSW advised later that [facility] in [place] will be contacting CSW to make a bed offer. CSW later checked providerlink and bed offer had been made. Call made to Gavin Pound at [facility] and they can accept patient on Friday. CSW verified patient's Medicaid with financial counselor for [employer]. CSW was also asked if Medicaid worker at [facility] can begin application process for transition to LTC Medicaid and CSW checked and was advised that this cannot be done at the hospital. Gavin Pound indicated that they will take care of this once the patient arrives at the facility.  CSW contacted [facility] transport to determine if they can take patient to facility in [place] as patient has his wheelchair with him. CSW advised by Misty Stanley that they can get price quote on Monday and can transport patient no sooner than Monday and no later than Wednesday of next week.  03/29/13 - LATE ENTRY: CSW contacted [facility] [phone]) and talked with Caryn Bee regarding transporting patient to SNF. CSW advised that they can provide transport on Friday.  Genelle Bal, MSW, LCSW [phone]

## 2013-03-30 MED ORDER — AMLODIPINE BESYLATE 5 MG PO TABS: 5.0000 mg | ORAL_TABLET | Freq: Every day | ORAL | Status: DC

## 2013-03-30 MED ORDER — NICOTINE 21 MG/24HR TD PT24: 1.0000 | MEDICATED_PATCH | Freq: Every day | TRANSDERMAL | Status: DC

## 2013-03-30 MED ORDER — POLYETHYLENE GLYCOL 3350 17 G PO PACK: 17.0000 g | PACK | Freq: Every day | ORAL | Status: DC | PRN

## 2013-03-30 MED ORDER — GABAPENTIN 300 MG PO CAPS: 600.0000 mg | ORAL_CAPSULE | Freq: Three times a day (TID) | ORAL | Status: DC

## 2013-03-30 MED ORDER — OXYCODONE-ACETAMINOPHEN 5-325 MG PO TABS
2.0000 | ORAL_TABLET | Freq: Once | ORAL | Status: AC
  Administered 2013-03-30: 2 via ORAL
  Filled 2013-03-30: qty 2

## 2013-03-30 MED ORDER — BACITRACIN-NEOMYCIN-POLYMYXIN OINTMENT TUBE: 1.0000 "application " | TOPICAL_OINTMENT | CUTANEOUS | Status: DC | PRN

## 2013-03-30 MED ORDER — SENNA 8.6 MG PO TABS: 1.0000 | ORAL_TABLET | Freq: Two times a day (BID) | ORAL | Status: DC

## 2013-03-30 NOTE — Discharge Summary (Signed)
Family Medicine Teaching [facility] Discharge Summary  Patient name: Jeffrey [name] Medical record number: [id number] Date of birth: 05/23/1956 Age: 57 y.o. Gender: male Date of Admission: 03/15/2013  Date of Discharge: 03/30/2013 Admitting Physician: Janit Pagan, MD  Primary Care Provider: No primary provider on file. Consultants: Wound Care  Indication for Hospitalization: Alcohol withdrawal  Discharge Diagnoses/Problem List:  Alcohol withdrawal/Substance abuse Skin Wounds Psychiatric Paraplegic with muscle spacticity  Disposition: To [facility] [place] [facility]  Discharge Condition: Stable  Brief Hospital Course: Zaeem Kandel is a 57 y.o. year old male presenting with "shakes" which started day of admission at 02:00. Pt is a known anlcoholic. Recently the pt has been drinking "anything he can get his hands on." Last alcoholic drink was the morning before admission which included beers, Listerine, and hard liquor. Associated w/ stomach pain, intermittent blurry vision. Denied visual hallucinations, seizures, chest pain, palpitations, syncope. After hospital treatment, he continued to stay in the hospital pending placement to a facility.  Alcohol Withdrawal/substance abuse We started Mr. Honse on an alcohol withdrawal protocol (CIWA) which monitored for manifestations of alcohol withdrawal. Per protocol, he was receiving 1mg  ativan PRN. He also received scheduled thiamine and folate and librium. His librium was later discontinued. Initially he had tremors and visual hallucinations. Via daily exams, his symptoms of alcohol withdrawal lessened. When his symptoms were minimal, he was taken off CIWA protocol. He did not require any future prn treatment with benzodiazepines and did not develop any manifestations of alcohol withdrawal.  Cellulitis/Skin Wounds Mr. Duquette came into the hospital with multiple skin wounds. He had two large left leg wounds. Wound cultures  were obtained and doxycyline po was started for general coverage of probably MRSA infection. Cultures were positive for MSSA and Mr. Pavlik antibiotic regimen was switched to keflex po. Wound care was consulted and they managed his wounds with dressings and therapeutic washing.   Psychiatric Patient has a history of depression and bipolar disorder. At the time of his admission, he was not taking any of his home medication. He denied suicidal ideation during admission. We continued his home Depakote and Trazadone. We held his Cymbalta during admission.  Paraplegic w/ muscle spasticity He was not taking his home medications and states that his right leg is a chronic source of pain. We consulted physical therapy and occupational therapy.   Issues for Follow Up:  1. Pain management - patient has chronic pain issues which are generally not controlled. He is generally not compliant with his medications 2. Substance abuse - patient has a history of substance abuse, and per patient, mostly related to self medication for chronic pain 3. Physical/Occupational therapy - patient does not use right leg and is currently mobile by wheelchair only 4. History of depression and bipolar disorder with medication non-compliance 5. Medication compliance - as stated multiple times above, patient is not reliable for taking certain medications.  Significant Procedures: None  Significant Labs and Imaging:   Recent Labs Lab 03/26/13 0445  WBC 5.6  HGB 13.9  HCT 43.5  PLT 286    Recent Labs Lab 03/26/13 0445  NA 135  K 3.9  CL 97  CO2 24  GLUCOSE 94  BUN 23  CREATININE 1.05  CALCIUM 9.8   Toxicology (7/10) - Positive for barbiturates, benzodiazepines, cocain and THC Alcohol (7/10) - <11 Wound culture (7/10) - MSSA HIV (7/13) - Non-reactive  Chest X-ray (7/10) Findings: Single view of the chest was obtained. The lungs are  clear. The  anterior aspect of the first ribs are prominent but   unchanged. Heart and mediastinum are within normal limits. The  trachea is midline.   IMPRESSION:  No acute cardiopulmonary disease.  CT Head (7/10) Findings:  Mild generalized atrophy.  Normal ventricular morphology.  No midline shift or mass effect.  Otherwise normal appearance of brain parenchyma.  No intracranial hemorrhage, mass lesion, or acute infarction.  Visualized paranasal sinuses clear.  Bones unremarkable.   IMPRESSION:  No acute intracranial abnormalities.  Outstanding Results: None  Discharge Medications:    Medication List    ASK your doctor about these medications       cephALEXin 500 MG capsule  Commonly known as:  KEFLEX  Take 1 capsule (500 mg total) by mouth every 12 (twelve) hours.     divalproex 500 MG DR tablet  Commonly known as:  DEPAKOTE  Take 1 tablet (500 mg total) by mouth every 12 (twelve) hours. For mood stabilization     DULoxetine 60 MG capsule  Commonly known as:  CYMBALTA  Take 1 capsule (60 mg total) by mouth daily. For depression/pain control     gabapentin 300 MG capsule  Commonly known as:  NEURONTIN  Take 3 capsules (900 mg total) by mouth 3 (three) times daily. For anxiety/pain control     metoprolol succinate 25 MG 24 hr tablet  Commonly known as:  TOPROL-XL  Take 1 tablet (25 mg total) by mouth daily. For high blood pressure control     oxyCODONE-acetaminophen 5-325 MG per tablet  Commonly known as:  PERCOCET/ROXICET  Take 1 tablet by mouth every 4 (four) hours as needed for pain.     potassium chloride SA 20 MEQ tablet  Commonly known as:  K-DUR,KLOR-CON  Take 1 tablet (20 mEq total) by mouth 2 (two) times daily. For low potassium     thiamine 100 MG tablet  Take 1 tablet (100 mg total) by mouth daily. For low thiamine     tiZANidine 4 MG tablet  Commonly known as:  ZANAFLEX  Take 1 tablet (4 mg total) by mouth every 8 (eight) hours as needed. For muscle pain     traMADol 50 MG tablet  Commonly known as:   ULTRAM  Take 1 tablet (50 mg total) by mouth 2 (two) times daily as needed for pain.     traZODone 50 MG tablet  Commonly known as:  DESYREL  Take 1 tablet (50 mg total) by mouth at bedtime as needed and may repeat dose one time if needed for sleep.        Discharge Instructions: Please refer to Patient Instructions section of EMR for full details.  Patient was counseled important signs and symptoms that should prompt return to medical care, changes in medications, dietary instructions, activity restrictions, and follow up appointments.   Follow-Up Appointments:   Jacquelin Hawking, MD 03/30/2013, 10:09 AM PGY-1, [facility] Family Medicine

## 2013-03-30 NOTE — Progress Notes (Signed)
PT Cancellation Note  Patient Details Name: Jeffrey [name] MRN: [id number] DOB: November 09, 1955   Cancelled Treatment:    Reason Eval/Treat Not Completed: Pain limiting ability to participate  Pt groaning in pain passing by room.  Pt also to d/c to ALF today.  Will check back on pt Monday if still in acute.   [name],[name] 03/30/2013, 2:22 PM Zenovia Jarred, PT, DPT 03/30/2013 Pager: [phone]

## 2013-03-30 NOTE — Progress Notes (Signed)
Family Medicine Teaching Service Daily Progress Note Intern Pager: [phone]  Patient name: Jeffrey [name] Medical record number: [id number] Date of birth: [birth year] Age: 57 y.o. Gender: male  Primary Care Provider: No primary provider on file. Consultants: WOC, SW, PT/OT Code Status: DNR/DNI  Pt Overview and Major Events to Date:  7/25 - Received a bed offer at [facility] in [place]  Assessment and Plan: Jeffrey [name] is a 57 y.o. year old homeless male w/ h/o Hep C, Etoh abuse w/ cirrhosis, polysubstance abuse, HTN, bipolar, paraplegia, presenting with tremors and soft tissue infection.   1. Substance Abuse: H/o polysubstance abuse with UDS positive for cocaine, THC, benzo on admission.  Last alcohol about 24 hours prior to admission; EtOH negative on admission.   - Etoh abuse counseling  - Nicotine patch (21mg )  - scheduled thiamine, folate and multivitamin [ ]  pt has a bed offer at [facility] in [place]. Can be transported no sooner than Monday (no later than Wednesday)  2. RLQ abdominal tenderness - tender for over a year. No leukocytosis or fever. Has had appendectomy 5 years ago.  3. Skin Wounds/Infection: numerous open skin wounds, 2 of which appear infected and possibly cellulitic. All in various stages of healing. Pt endorses picking of mosquito bites on upper and lower extremities. LE extremity wound borders demarcated w/ skin marking pen. No signs of systemic illness. Recent Rx for Keflex on 02/27/13 was not filled. Last day of total antibiotic treatment (7/23). - doxycycline stopped on day #7. - nursing to bandage various arm lesions to help deter pt from picking  - air overlay matress  - wound culture - Staph aureus, MSSA - last dose of keflex received on 7/23  4. CV: h/o HTN and positive for cocaine. Hypertensive on admission. No current signs of MI  - continue amlodipine 5mg  QD given h/o cocaine  - Hydralazine PRN   5. ID: no signs of  sepsis, but w/ h/o Hep C and syphilis and active drug abuser. Cellulitis as above  - ABX as above  - HIV negative  6. Psych: h/o depression and bipolar. Not taking any of his home medications. SI about 1 month ago but denies current SI/HI.  - Will wait to restart cymbalta at time of DC  - home Depakote.  - Home trazadone PRN QHS ordered   7. MSK: paraplegic w/ muscle spasticity. Not taking home medications. Source of chronic pain for pt.  - PT/OT rec SNF - continue home Zanaflex  - continuegabapentin 300mg  TID  8. FEN/GI: Tolerating PO but w/ stomach pain likely from withdrawal. 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  - Regular diet  - Zofran PRN  - Senna and miralax   Prophylaxis: Hep SQ TID  Disposition: To ALF on Monday pending transportation - OT recommends SNF for continued OT services - PT recommends SNF/Home health PT; wheelchair cushion  Subjective: No complaints overnight. He is excited about recent news about placement.  Objective: Temp:  [97.6 F (36.4 C)-98.7 F (37.1 C)] 98.7 F (37.1 C) (07/24 2140) Pulse Rate:  [69-84] 84 (07/24 2140) Resp:  [18] 18 (07/24 2140) BP: (113-154)/(69-103) 152/85 mmHg (07/24 2140) SpO2:  [97 %-98 %] 98 % (07/24 2140) Weight:  [233 lb 7.5 oz (105.9 kg)] 233 lb 7.5 oz (105.9 kg) (07/24 2140)  Physical Exam: General: laying in bed, in no acute distress CV: RRR, no murmurs Pulm: CTAB, no wheeze Abd: soft, non  distended, palpable liver 6cm below costal margin. Extremities: multiple lesions on arms from picking. None look infected Neuro: alert & oriented x3, having regular conversation   Jacquelin Hawking, MD 03/30/2013, 5:59 AM PGY-1, [facility] Family Medicine FPTS Intern pager: [phone], text pages welcome

## 2013-03-30 NOTE — Progress Notes (Signed)
Discussed in rounds.  Medically stable and ready for DC.

## 2013-03-30 NOTE — Discharge Summary (Signed)
Agree with DC as outlined by Dr. Caleb Popp.  Mr. Nieman has been medically stable.  I am delighted that there is a bed offer and that we do not have to DC to street.

## 2013-06-06 ENCOUNTER — Telehealth (INDEPENDENT_AMBULATORY_CARE_PROVIDER_SITE_OTHER): Payer: Self-pay | Admitting: *Deleted

## 2013-06-06 NOTE — Telephone Encounter (Signed)
Jeffrey [name] with [facility] called regarding a renewal for a wheelchair order signed in March 2014 by Dr. Lindie Spruce.  Patient No Showed his follow up appts with trauma clinic and has never contacted this office since we saw him in the hospital in March 2014.  Spoke to Dr. Lindie Spruce who states this is no longer our patient and he would need to obtain that order from another provider.  Updated Jeffrey [name] with [facility] who states understanding at this time.

## 2014-07-16 DIAGNOSIS — I2510 Atherosclerotic heart disease of native coronary artery without angina pectoris: Secondary | ICD-10-CM

## 2014-07-16 HISTORY — DX: Atherosclerotic heart disease of native coronary artery without angina pectoris: I25.10

## 2014-08-04 ENCOUNTER — Emergency Department (HOSPITAL_COMMUNITY)
Admission: EM | Admit: 2014-08-04 | Discharge: 2014-08-04 | Disposition: A | Discharge status: Home / Self Care | Payer: Medicaid Other | Attending: Emergency Medicine | Admitting: Emergency Medicine

## 2014-08-04 ENCOUNTER — Encounter (HOSPITAL_COMMUNITY): Payer: Self-pay | Admitting: Emergency Medicine

## 2014-08-04 DIAGNOSIS — Z8619 Personal history of other infectious and parasitic diseases: Secondary | ICD-10-CM | POA: Insufficient documentation

## 2014-08-04 DIAGNOSIS — Y9389 Activity, other specified: Secondary | ICD-10-CM | POA: Diagnosis not present

## 2014-08-04 DIAGNOSIS — Z79899 Other long term (current) drug therapy: Secondary | ICD-10-CM | POA: Diagnosis not present

## 2014-08-04 DIAGNOSIS — I252 Old myocardial infarction: Secondary | ICD-10-CM | POA: Insufficient documentation

## 2014-08-04 DIAGNOSIS — F329 Major depressive disorder, single episode, unspecified: Secondary | ICD-10-CM | POA: Insufficient documentation

## 2014-08-04 DIAGNOSIS — Y998 Other external cause status: Secondary | ICD-10-CM | POA: Insufficient documentation

## 2014-08-04 DIAGNOSIS — I10 Essential (primary) hypertension: Secondary | ICD-10-CM | POA: Diagnosis not present

## 2014-08-04 DIAGNOSIS — Z720 Tobacco use: Secondary | ICD-10-CM | POA: Insufficient documentation

## 2014-08-04 DIAGNOSIS — Y9289 Other specified places as the place of occurrence of the external cause: Secondary | ICD-10-CM | POA: Insufficient documentation

## 2014-08-04 DIAGNOSIS — X58XXXA Exposure to other specified factors, initial encounter: Secondary | ICD-10-CM | POA: Insufficient documentation

## 2014-08-04 DIAGNOSIS — G8929 Other chronic pain: Secondary | ICD-10-CM | POA: Diagnosis not present

## 2014-08-04 DIAGNOSIS — S39012A Strain of muscle, fascia and tendon of lower back, initial encounter: Secondary | ICD-10-CM | POA: Diagnosis not present

## 2014-08-04 DIAGNOSIS — G20 Parkinson's disease: Secondary | ICD-10-CM | POA: Diagnosis not present

## 2014-08-04 DIAGNOSIS — Z8739 Personal history of other diseases of the musculoskeletal system and connective tissue: Secondary | ICD-10-CM | POA: Insufficient documentation

## 2014-08-04 DIAGNOSIS — S3992XA Unspecified injury of lower back, initial encounter: Secondary | ICD-10-CM | POA: Diagnosis present

## 2014-08-04 DIAGNOSIS — Z9889 Other specified postprocedural states: Secondary | ICD-10-CM | POA: Insufficient documentation

## 2014-08-04 MED ORDER — HYDROMORPHONE HCL 1 MG/ML IJ SOLN
2.0000 mg | Freq: Once | INTRAMUSCULAR | Status: AC
  Administered 2014-08-04: 2 mg via INTRAMUSCULAR
  Filled 2014-08-04: qty 2

## 2014-08-04 MED ORDER — ONDANSETRON 4 MG PO TBDP
8.0000 mg | ORAL_TABLET | Freq: Once | ORAL | Status: AC
  Administered 2014-08-04: 8 mg via ORAL
  Filled 2014-08-04: qty 2

## 2014-08-04 MED ORDER — OXYCODONE-ACETAMINOPHEN 5-325 MG PO TABS: 2.0000 | ORAL_TABLET | ORAL | Status: DC | PRN
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
## 2014-08-04 NOTE — Discharge Instructions (Signed)

## 2014-08-04 NOTE — ED Notes (Signed)
Notified [facility] for transportation back home 

## 2014-08-04 NOTE — ED Notes (Signed)
MD Pollina at bedside. 

## 2014-08-04 NOTE — ED Provider Notes (Signed)
CSN: [id number]     Arrival date & time 08/04/14  1044 History   First MD Initiated Contact with Patient 08/04/14 1045     Chief Complaint  Patient presents with  . Back Pain  . Hip Pain     (Consider location/radiation/quality/duration/timing/severity/associated sxs/prior Treatment) HPI Comments: Patient presents to the ER for evaluation of back pain. Patient reports a history of chronic back pain. Patient is wheelchair bound secondary to spinal cord injury. He reports that he bent over to pick objects up off of the ground this morning and felt increased pain in his back. Patient reporting severe, constant, sharp and stabbing pain in the low back radiating to both hips. Patient denies fall and direct injury.  Patient is a 58 y.o. male presenting with back pain and hip pain.  Back Pain Hip Pain    Past Medical History  Diagnosis Date  . DDD (degenerative disc disease)   . Necrotizing fasciitis   . Degenerative disc disease   . Hypertension   . Degenerative disc disease   . Hepatitis C   . Chronic pain   . DDD (degenerative disc disease)   . Parkinson disease   . Mental disorder   . Depression   . DDD (degenerative disc disease)   . DDD (degenerative disc disease), lumbar   . Myocardial infarction acute    Past Surgical History  Procedure Laterality Date  . Hip surgery    . Appendectomy    . Ankle surgery    . Incision and drainage of wound N/A 11/26/2012    Procedure: IRRIGATION AND DEBRIDEMENT WOUND;  Surgeon: Emelia LoronMatthew Wakefield, MD;  [facility];  Service: General;  Laterality: N/A;  . Wound exploration N/A 11/26/2012    Procedure: WOUND EXPLORATION;  Surgeon: Emelia LoronMatthew Wakefield, MD;  [facility];  Service: General;  Laterality: N/A;   No family history on file. History  Substance Use Topics  . Smoking status: Current Every Day Smoker -- 1.00 packs/day    Types: Cigarettes    Last Attempt to Quit: 11/27/2012  . Smokeless tobacco: Current User    Types: Chew   . Alcohol Use: 1.5 oz/week     Comment: anything he can get his hands on    Review of Systems  Musculoskeletal: Positive for back pain.  All other systems reviewed and are negative.     Allergies  Review of patient's allergies indicates no known allergies.  Home Medications   Prior to Admission medications   Medication Sig Start Date End Date Taking? Authorizing Provider  amLODipine (NORVASC) 5 MG tablet Take 1 tablet (5 mg total) by mouth daily. 03/30/13   Jacquelin Hawkingalph Nettey, MD  divalproex (DEPAKOTE) 500 MG DR tablet Take 1 tablet (500 mg total) by mouth every 12 (twelve) hours. For mood stabilization 02/26/13   Sanjuana KavaAgnes I Nwoko, NP  DULoxetine (CYMBALTA) 60 MG capsule Take 1 capsule (60 mg total) by mouth daily. For depression/pain control 02/26/13   Sanjuana KavaAgnes I Nwoko, NP  gabapentin (NEURONTIN) 300 MG capsule Take 2 capsules (600 mg total) by mouth 3 (three) times daily. 03/30/13   Jacquelin Hawkingalph Nettey, MD  neomycin-bacitracin-polymyxin (NEOSPORIN) OINT Apply 1 application topically as needed. 03/30/13   Jacquelin Hawkingalph Nettey, MD  nicotine (NICODERM CQ - DOSED IN MG/24 HOURS) 21 mg/24hr patch Place 1 patch onto the skin daily. 03/30/13   Jacquelin Hawkingalph Nettey, MD  oxyCODONE-acetaminophen (PERCOCET/ROXICET) 5-325 MG per tablet Take 1 tablet by mouth every 4 (four) hours as needed for pain. 02/26/13  Sanjuana KavaAgnes I Nwoko, NP  polyethylene glycol (MIRALAX / GLYCOLAX) packet Take 17 g by mouth daily as needed. 03/30/13   Jacquelin Hawkingalph Nettey, MD  potassium chloride SA (K-DUR,KLOR-CON) 20 MEQ tablet Take 1 tablet (20 mEq total) by mouth 2 (two) times daily. For low potassium 02/26/13   Sanjuana KavaAgnes I Nwoko, NP  senna (SENOKOT) 8.6 MG TABS Take 1 tablet (8.6 mg total) by mouth 2 (two) times daily. 03/30/13   Jacquelin Hawkingalph Nettey, MD  thiamine 100 MG tablet Take 1 tablet (100 mg total) by mouth daily. For low thiamine 02/26/13   Sanjuana KavaAgnes I Nwoko, NP  tiZANidine (ZANAFLEX) 4 MG tablet Take 1 tablet (4 mg total) by mouth every 8 (eight) hours as needed. For muscle pain  02/26/13   Sanjuana KavaAgnes I Nwoko, NP  traMADol (ULTRAM) 50 MG tablet Take 1 tablet (50 mg total) by mouth 2 (two) times daily as needed for pain. 02/26/13   Sanjuana KavaAgnes I Nwoko, NP  traZODone (DESYREL) 50 MG tablet Take 1 tablet (50 mg total) by mouth at bedtime as needed and may repeat dose one time if needed for sleep. 02/26/13   Sanjuana KavaAgnes I Nwoko, NP   BP 129/77 mmHg  Pulse 103  Temp(Src) 97.3 F (36.3 C) (Oral)  Resp 20  SpO2 97% Physical Exam  Constitutional: He is oriented to person, place, and time. He appears well-developed and well-nourished. No distress.  HENT:  Head: Normocephalic and atraumatic.  Right Ear: Hearing normal.  Left Ear: Hearing normal.  Nose: Nose normal.  Mouth/Throat: Oropharynx is clear and moist and mucous membranes are normal.  Eyes: Conjunctivae and EOM are normal. Pupils are equal, round, and reactive to light.  Neck: Normal range of motion. Neck supple.  Cardiovascular: Regular rhythm, S1 normal and S2 normal.  Exam reveals no gallop and no friction rub.   No murmur heard. Pulmonary/Chest: Effort normal and breath sounds normal. No respiratory distress. He exhibits no tenderness.  Abdominal: Soft. Normal appearance and bowel sounds are normal. There is no hepatosplenomegaly. There is no tenderness. There is no rebound, no guarding, no tenderness at McBurney's point and negative Murphy's sign. No hernia.  Musculoskeletal: Normal range of motion.       Lumbar back: He exhibits tenderness.       Back:  Neurological: He is alert and oriented to person, place, and time. He has normal strength. GCS eye subscore is 4. GCS verbal subscore is 5. GCS motor subscore is 6.  Decreased strength, sensation, coordination bilateral lower extremities  Skin: Skin is warm, dry and intact. No rash noted. No cyanosis.  Psychiatric: He has a normal mood and affect. His speech is normal and behavior is normal. Thought content normal.  Nursing note and vitals reviewed.   ED Course  Procedures  (including critical care time) Labs Review Labs Reviewed - No data to display  Imaging Review No results found.   EKG Interpretation None      MDM   Final diagnoses:  None   lumbar strain  Patient presents to the ER for evaluation of worsening back pain. Patient has a previous history of chronic low back pain. Patient reports sudden worsening of his pain after bending over to lift something off the ground. Neurologic examination is difficult because he has a history of spastic paraplegia. Pain is limited to the low back without any significant radiation past the hips. Patient is a minister analgesia with significant improvement. He is appropriate for discharge.    Gilda Creasehristopher J. Amelita Risinger, MD 08/04/14  1341 

## 2014-08-05 ENCOUNTER — Encounter (HOSPITAL_COMMUNITY): Payer: Self-pay | Admitting: Emergency Medicine

## 2014-08-05 ENCOUNTER — Inpatient Hospital Stay (HOSPITAL_COMMUNITY)
Admission: EM | Admit: 2014-08-05 | Discharge: 2014-08-09 | DRG: 281 | Disposition: A | Discharge status: Home / Self Care | Payer: Medicaid Other | Attending: Internal Medicine | Admitting: Internal Medicine

## 2014-08-05 DIAGNOSIS — F17210 Nicotine dependence, cigarettes, uncomplicated: Secondary | ICD-10-CM | POA: Diagnosis present

## 2014-08-05 DIAGNOSIS — F1010 Alcohol abuse, uncomplicated: Secondary | ICD-10-CM | POA: Diagnosis present

## 2014-08-05 DIAGNOSIS — R52 Pain, unspecified: Secondary | ICD-10-CM

## 2014-08-05 DIAGNOSIS — T8131XD Disruption of external operation (surgical) wound, not elsewhere classified, subsequent encounter: Secondary | ICD-10-CM

## 2014-08-05 DIAGNOSIS — I959 Hypotension, unspecified: Secondary | ICD-10-CM | POA: Diagnosis present

## 2014-08-05 DIAGNOSIS — G20 Parkinson's disease: Secondary | ICD-10-CM | POA: Diagnosis present

## 2014-08-05 DIAGNOSIS — R45851 Suicidal ideations: Secondary | ICD-10-CM

## 2014-08-05 DIAGNOSIS — R778 Other specified abnormalities of plasma proteins: Secondary | ICD-10-CM | POA: Diagnosis present

## 2014-08-05 DIAGNOSIS — I5042 Chronic combined systolic (congestive) and diastolic (congestive) heart failure: Secondary | ICD-10-CM | POA: Diagnosis present

## 2014-08-05 DIAGNOSIS — G8929 Other chronic pain: Secondary | ICD-10-CM

## 2014-08-05 DIAGNOSIS — B182 Chronic viral hepatitis C: Secondary | ICD-10-CM | POA: Diagnosis present

## 2014-08-05 DIAGNOSIS — M545 Low back pain, unspecified: Secondary | ICD-10-CM | POA: Diagnosis present

## 2014-08-05 DIAGNOSIS — I10 Essential (primary) hypertension: Secondary | ICD-10-CM | POA: Diagnosis present

## 2014-08-05 DIAGNOSIS — I214 Non-ST elevation (NSTEMI) myocardial infarction: Secondary | ICD-10-CM

## 2014-08-05 DIAGNOSIS — I252 Old myocardial infarction: Secondary | ICD-10-CM

## 2014-08-05 DIAGNOSIS — G894 Chronic pain syndrome: Secondary | ICD-10-CM | POA: Diagnosis present

## 2014-08-05 DIAGNOSIS — Z590 Homelessness: Secondary | ICD-10-CM

## 2014-08-05 DIAGNOSIS — I213 ST elevation (STEMI) myocardial infarction of unspecified site: Principal | ICD-10-CM | POA: Diagnosis present

## 2014-08-05 DIAGNOSIS — R460 Very low level of personal hygiene: Secondary | ICD-10-CM | POA: Diagnosis present

## 2014-08-05 DIAGNOSIS — R51 Headache: Secondary | ICD-10-CM | POA: Diagnosis present

## 2014-08-05 DIAGNOSIS — I2510 Atherosclerotic heart disease of native coronary artery without angina pectoris: Secondary | ICD-10-CM | POA: Diagnosis present

## 2014-08-05 DIAGNOSIS — M1389 Other specified arthritis, multiple sites: Secondary | ICD-10-CM | POA: Diagnosis present

## 2014-08-05 DIAGNOSIS — M5136 Other intervertebral disc degeneration, lumbar region: Secondary | ICD-10-CM | POA: Diagnosis present

## 2014-08-05 DIAGNOSIS — F319 Bipolar disorder, unspecified: Secondary | ICD-10-CM | POA: Diagnosis present

## 2014-08-05 DIAGNOSIS — R7989 Other specified abnormal findings of blood chemistry: Secondary | ICD-10-CM

## 2014-08-05 DIAGNOSIS — N390 Urinary tract infection, site not specified: Secondary | ICD-10-CM | POA: Diagnosis present

## 2014-08-05 DIAGNOSIS — B965 Pseudomonas (aeruginosa) (mallei) (pseudomallei) as the cause of diseases classified elsewhere: Secondary | ICD-10-CM | POA: Diagnosis present

## 2014-08-05 DIAGNOSIS — F329 Major depressive disorder, single episode, unspecified: Secondary | ICD-10-CM | POA: Diagnosis present

## 2014-08-05 DIAGNOSIS — G8220 Paraplegia, unspecified: Secondary | ICD-10-CM | POA: Diagnosis present

## 2014-08-05 DIAGNOSIS — Z79899 Other long term (current) drug therapy: Secondary | ICD-10-CM

## 2014-08-05 DIAGNOSIS — F419 Anxiety disorder, unspecified: Secondary | ICD-10-CM | POA: Diagnosis present

## 2014-08-05 DIAGNOSIS — G2401 Drug induced subacute dyskinesia: Secondary | ICD-10-CM | POA: Diagnosis present

## 2014-08-05 DIAGNOSIS — F341 Dysthymic disorder: Secondary | ICD-10-CM | POA: Diagnosis present

## 2014-08-05 HISTORY — DX: Unspecified osteoarthritis, unspecified site: M19.90

## 2014-08-05 HISTORY — DX: Anxiety disorder, unspecified: F41.9

## 2014-08-05 HISTORY — DX: Pure hypercholesterolemia, unspecified: E78.00

## 2014-08-05 LAB — COMPREHENSIVE METABOLIC PANEL
ALBUMIN: 3.5 g/dL (ref 3.5–5.2)
ALK PHOS: 83 U/L (ref 39–117)
ALT: 7 U/L (ref 0–53)
AST: 16 U/L (ref 0–37)
Anion gap: 20 — ABNORMAL HIGH (ref 5–15)
BILIRUBIN TOTAL: 0.3 mg/dL (ref 0.3–1.2)
BUN: 12 mg/dL (ref 6–23)
CHLORIDE: 103 meq/L (ref 96–112)
CO2: 21 meq/L (ref 19–32)
CREATININE: 0.95 mg/dL (ref 0.50–1.35)
Calcium: 9.5 mg/dL (ref 8.4–10.5)
GFR calc Af Amer: >90 mL/min (ref >90)
Glucose, Bld: 72 mg/dL (ref 70–99)
POTASSIUM: 3.7 meq/L (ref 3.7–5.3)
SODIUM: 144 meq/L (ref 137–147)
Total Protein: 7.6 g/dL (ref 6.0–8.3)

## 2014-08-05 LAB — CBC
HCT: 41.8 % (ref 39.0–52.0)
Hemoglobin: 13.8 g/dL (ref 13.0–17.0)
MCH: 27.9 pg (ref 26.0–34.0)
MCHC: 33 g/dL (ref 30.0–36.0)
MCV: 84.6 fL (ref 78.0–100.0)
PLATELETS: 296 10*3/uL (ref 150–400)
RBC: 4.94 MIL/uL (ref 4.22–5.81)
RDW: 14.4 % (ref 11.5–15.5)
WBC: 13 10*3/uL — AB (ref 4.0–10.5)

## 2014-08-05 LAB — SALICYLATE LEVEL: Salicylate Lvl: <2 mg/dL — ABNORMAL LOW (ref 2.8–20.0)

## 2014-08-05 LAB — ACETAMINOPHEN LEVEL: Acetaminophen (Tylenol), Serum: <15 ug/mL (ref 10–30)

## 2014-08-05 LAB — ETHANOL

## 2014-08-05 MED ORDER — IBUPROFEN 600 MG PO TABS
600.0000 mg | ORAL_TABLET | Freq: Three times a day (TID) | ORAL | Status: DC | PRN
  Administered 2014-08-05: 600 mg via ORAL
  Filled 2014-08-05: qty 3
  Filled 2014-08-05: qty 1

## 2014-08-05 MED ORDER — LISINOPRIL 2.5 MG PO TABS
2.5000 mg | ORAL_TABLET | Freq: Every day | ORAL | Status: DC
  Administered 2014-08-05 – 2014-08-09 (×4): 2.5 mg via ORAL
  Filled 2014-08-05 (×6): qty 1

## 2014-08-05 MED ORDER — TRAZODONE HCL 100 MG PO TABS
100.0000 mg | ORAL_TABLET | Freq: Every day | ORAL | Status: DC
  Administered 2014-08-05 – 2014-08-08 (×4): 100 mg via ORAL
  Filled 2014-08-05 (×6): qty 1

## 2014-08-05 MED ORDER — CYCLOBENZAPRINE HCL 10 MG PO TABS
10.0000 mg | ORAL_TABLET | Freq: Three times a day (TID) | ORAL | Status: DC
  Administered 2014-08-05 – 2014-08-09 (×11): 10 mg via ORAL
  Filled 2014-08-05 (×16): qty 1

## 2014-08-05 MED ORDER — ISOSORBIDE MONONITRATE ER 30 MG PO TB24
30.0000 mg | ORAL_TABLET | Freq: Every day | ORAL | Status: DC
  Administered 2014-08-05 – 2014-08-07 (×3): 30 mg via ORAL
  Filled 2014-08-05 (×4): qty 1

## 2014-08-05 MED ORDER — ACETAMINOPHEN 325 MG PO TABS
650.0000 mg | ORAL_TABLET | ORAL | Status: DC | PRN
  Administered 2014-08-06 – 2014-08-09 (×2): 650 mg via ORAL
  Filled 2014-08-05 (×2): qty 2

## 2014-08-05 MED ORDER — FENTANYL 25 MCG/HR TD PT72
25.0000 ug | MEDICATED_PATCH | TRANSDERMAL | Status: DC
  Administered 2014-08-05: 25 ug via TRANSDERMAL
  Filled 2014-08-05: qty 1

## 2014-08-05 MED ORDER — ATORVASTATIN CALCIUM 80 MG PO TABS
80.0000 mg | ORAL_TABLET | Freq: Every day | ORAL | Status: DC
  Administered 2014-08-05 – 2014-08-09 (×5): 80 mg via ORAL
  Filled 2014-08-05 (×6): qty 1

## 2014-08-05 MED ORDER — RANOLAZINE ER 500 MG PO TB12
500.0000 mg | ORAL_TABLET | Freq: Two times a day (BID) | ORAL | Status: DC
  Administered 2014-08-05: 500 mg via ORAL
  Filled 2014-08-05 (×3): qty 1

## 2014-08-05 MED ORDER — AMLODIPINE BESYLATE 5 MG PO TABS
5.0000 mg | ORAL_TABLET | Freq: Every day | ORAL | Status: DC
  Administered 2014-08-05: 5 mg via ORAL
  Filled 2014-08-05 (×2): qty 1

## 2014-08-05 MED ORDER — OLANZAPINE 10 MG PO TABS
10.0000 mg | ORAL_TABLET | Freq: Every day | ORAL | Status: DC
  Administered 2014-08-05 – 2014-08-08 (×4): 10 mg via ORAL
  Filled 2014-08-05 (×5): qty 1

## 2014-08-05 MED ORDER — LORAZEPAM 1 MG PO TABS
1.0000 mg | ORAL_TABLET | Freq: Three times a day (TID) | ORAL | Status: DC | PRN
  Administered 2014-08-06: 1 mg via ORAL
  Filled 2014-08-05: qty 2

## 2014-08-05 MED ORDER — DULOXETINE HCL 60 MG PO CPEP
60.0000 mg | ORAL_CAPSULE | Freq: Every day | ORAL | Status: DC
  Administered 2014-08-05 – 2014-08-09 (×5): 60 mg via ORAL
  Filled 2014-08-05 (×6): qty 1

## 2014-08-05 NOTE — Progress Notes (Signed)
CSW met with Pt at bedside. Per note, the Pt is having SI and HI. Pt says he is now feeling depressed because there were complaints about him at the shelter [facility]) smelling of urine. Patient informed CSW that he is not able to go to the bathroom own his own at most times. The patient says he is in a wheelchair.  Patient stated he has been at the shelter for 10 days but just cannot take care of himself anymore. Pt mentioned that he was paraplegic. He says he does not have any support . When asked could he return to shelter the Pt said " They didn't say I could ...they didn't say I couldn't."  CSW will call the shelter to determine if he still has a bed. The Pt stated that his wheelchair and some of his other belongings were still there.   Willette Brace [phone] ED CSW 08/05/2014 10:44 PM

## 2014-08-05 NOTE — ED Notes (Signed)
Brought in by EMS from a shelter home with c/o suicidal ideations.  Pt reported that he was "going to be kicked out of the shelter because of poor hygiene and bad smell".  Pt started "feeling so depressed that he thought of killing himself by taking pills".  Pt denies HI.   Pt has chronic back pain.

## 2014-08-05 NOTE — ED Notes (Signed)
Bed: WA04 Expected date:  Expected time:  Means of arrival:  Comments: EMS poor hygiene, chronic pain

## 2014-08-05 NOTE — ED Notes (Signed)
Was not able to get an accurate blood pressure due to pt moving around and being no compliant. States he is in pain. RN and MD notifited

## 2014-08-05 NOTE — Progress Notes (Signed)
CSW reached out to [facility] an spoke a Technical brewerrepresentative named Interior and spatial designerBobby. CSW was informed if Pt left the shelter today and his belongings are still there. He should still have a bed available for him.   Trish MageBrittney Mcguire Gasparyan, LCSWA [phone] ED CSW 08/05/2014 11:08 PM

## 2014-08-05 NOTE — ED Provider Notes (Signed)
CSN: [id number]     Arrival date & time 08/05/14  1942 History   First MD Initiated Contact with Patient 08/05/14 2024     Chief Complaint  Patient presents with  . Suicidal   HPI Patient presents to the emergency room with complaints of suicidal ideation. Patient has a history of paraplegia. He uses a wheelchair. Patient has difficulty and when he goes to urinate and often soils himself. Patient states he often smells of urine and the staff at the homeless shelter were going to kick him out because of poor hygiene. Patient became depressed and was thinking about overdosing on pills. He denies any homicidal ideation. Patient does have chronic pain in his back and continues to have that while he is here. He denies any fevers or chills. No abdominal pain. No vomiting. Past Medical History  Diagnosis Date  . DDD (degenerative disc disease)   . Necrotizing fasciitis   . Degenerative disc disease   . Hypertension   . Degenerative disc disease   . Hepatitis C   . Chronic pain   . DDD (degenerative disc disease)   . Parkinson disease   . Mental disorder   . Depression   . DDD (degenerative disc disease)   . DDD (degenerative disc disease), lumbar   . Myocardial infarction acute    Past Surgical History  Procedure Laterality Date  . Hip surgery    . Appendectomy    . Ankle surgery    . Incision and drainage of wound N/A 11/26/2012    Procedure: IRRIGATION AND DEBRIDEMENT WOUND;  Surgeon: Emelia LoronMatthew Wakefield, MD;  [facility];  Service: General;  Laterality: N/A;  . Wound exploration N/A 11/26/2012    Procedure: WOUND EXPLORATION;  Surgeon: Emelia LoronMatthew Wakefield, MD;  [facility];  Service: General;  Laterality: N/A;   History reviewed. No pertinent family history. History  Substance Use Topics  . Smoking status: Current Every Day Smoker -- 1.00 packs/day    Types: Cigarettes    Last Attempt to Quit: 11/27/2012  . Smokeless tobacco: Current User    Types: Chew  . Alcohol Use: 1.5  oz/week     Comment: anything he can get his hands on    Review of Systems  All other systems reviewed and are negative.     Allergies  Review of patient's allergies indicates no known allergies.  Home Medications   Prior to Admission medications   Medication Sig Start Date End Date Taking? Authorizing Provider  amLODipine (NORVASC) 5 MG tablet Take 1 tablet (5 mg total) by mouth daily. 03/30/13  Yes Jacquelin Hawkingalph Nettey, MD  atorvastatin (LIPITOR) 80 MG tablet Take 80 mg by mouth daily.   Yes Historical Provider, MD  cyclobenzaprine (FLEXERIL) 10 MG tablet Take 10 mg by mouth 3 (three) times daily.   Yes Historical Provider, MD  DULoxetine (CYMBALTA) 60 MG capsule Take 1 capsule (60 mg total) by mouth daily. For depression/pain control 02/26/13  Yes Sanjuana KavaAgnes I Nwoko, NP  fentaNYL (DURAGESIC - DOSED MCG/HR) 25 MCG/HR patch Place 25 mcg onto the skin every 3 (three) days.   Yes Historical Provider, MD  isosorbide mononitrate (IMDUR) 30 MG 24 hr tablet Take 30 mg by mouth daily.   Yes Historical Provider, MD  lisinopril (PRINIVIL,ZESTRIL) 2.5 MG tablet Take 2.5 mg by mouth daily.   Yes Historical Provider, MD  OLANZapine (ZYPREXA) 10 MG tablet Take 10 mg by mouth at bedtime.   Yes Historical Provider, MD  oxyCODONE-acetaminophen (PERCOCET) 5-325 MG  per tablet Take 2 tablets by mouth every 4 (four) hours as needed. 08/04/14  Yes Gilda Creasehristopher J. Pollina, MD  ranolazine (RANEXA) 500 MG 12 hr tablet Take 500 mg by mouth daily.    Yes Historical Provider, MD  traZODone (DESYREL) 100 MG tablet Take 100 mg by mouth at bedtime.   Yes Historical Provider, MD   BP 115/81 mmHg  Pulse 76  Temp(Src) 98.7 F (37.1 C) (Oral)  Resp 20  SpO2 94% Physical Exam  Constitutional: No distress.  HENT:  Head: Normocephalic and atraumatic.  Right Ear: External ear normal.  Left Ear: External ear normal.  Eyes: Conjunctivae are normal. Right eye exhibits no discharge. Left eye exhibits no discharge. No scleral  icterus.  Neck: Neck supple. No tracheal deviation present.  Cardiovascular: Normal rate, regular rhythm and intact distal pulses.   Pulmonary/Chest: Effort normal and breath sounds normal. No stridor. No respiratory distress. He has no wheezes. He has no rales.  Abdominal: Soft. Bowel sounds are normal. He exhibits no distension. There is no tenderness. There is no rebound and no guarding.  Musculoskeletal: He exhibits no edema or tenderness.  Small ulceration on the left hip no erythema or purulent drainage  Neurological: He is alert. No cranial nerve deficit (no facial droop, extraocular movements intact, no slurred speech). He exhibits abnormal muscle tone. He displays no seizure activity. Coordination normal.  Paraplegia  Skin: Skin is warm and dry. No rash noted.  Psychiatric: His speech is not delayed and not slurred. He is not withdrawn and not actively hallucinating. He exhibits a depressed mood. He expresses suicidal ideation. He expresses suicidal plans.  Nursing note and vitals reviewed.   ED Course  Procedures (including critical care time) Labs Review Labs Reviewed  CBC - Abnormal; Notable for the following:    WBC 13.0 (*)    All other components within normal limits  COMPREHENSIVE METABOLIC PANEL - Abnormal; Notable for the following:    Anion gap 20 (*)    All other components within normal limits  SALICYLATE LEVEL - Abnormal; Notable for the following:    Salicylate Lvl <2.0 (*)    All other components within normal limits  ETHANOL  ACETAMINOPHEN LEVEL  URINE RAPID DRUG SCREEN (HOSP PERFORMED)  URINALYSIS, ROUTINE W REFLEX MICROSCOPIC      MDM   Final diagnoses:  Suicidal ideation  Chronic pain    Pt has history of chronic back pain.  Requesting pain meds.  Fentanyl patch was ordered.  Recurrent suicidal ideation.  Will consult with psychiatry.   Elevated WBC but no signs of infection.  Will check urine.  Otherwise medically cleared    Linwood DibblesJon Emrey Thornley,  MD 08/05/14 2244

## 2014-08-06 ENCOUNTER — Encounter (HOSPITAL_COMMUNITY): Payer: Self-pay | Admitting: General Practice

## 2014-08-06 ENCOUNTER — Emergency Department (HOSPITAL_COMMUNITY): Payer: Medicaid Other

## 2014-08-06 DIAGNOSIS — G894 Chronic pain syndrome: Secondary | ICD-10-CM | POA: Diagnosis present

## 2014-08-06 DIAGNOSIS — F319 Bipolar disorder, unspecified: Secondary | ICD-10-CM | POA: Diagnosis present

## 2014-08-06 DIAGNOSIS — B965 Pseudomonas (aeruginosa) (mallei) (pseudomallei) as the cause of diseases classified elsewhere: Secondary | ICD-10-CM | POA: Diagnosis present

## 2014-08-06 DIAGNOSIS — I517 Cardiomegaly: Secondary | ICD-10-CM

## 2014-08-06 DIAGNOSIS — R45851 Suicidal ideations: Secondary | ICD-10-CM

## 2014-08-06 DIAGNOSIS — F17210 Nicotine dependence, cigarettes, uncomplicated: Secondary | ICD-10-CM | POA: Diagnosis present

## 2014-08-06 DIAGNOSIS — M545 Low back pain: Secondary | ICD-10-CM | POA: Diagnosis present

## 2014-08-06 DIAGNOSIS — I213 ST elevation (STEMI) myocardial infarction of unspecified site: Secondary | ICD-10-CM | POA: Diagnosis not present

## 2014-08-06 DIAGNOSIS — G8220 Paraplegia, unspecified: Secondary | ICD-10-CM | POA: Diagnosis present

## 2014-08-06 DIAGNOSIS — M5136 Other intervertebral disc degeneration, lumbar region: Secondary | ICD-10-CM | POA: Diagnosis present

## 2014-08-06 DIAGNOSIS — I10 Essential (primary) hypertension: Secondary | ICD-10-CM

## 2014-08-06 DIAGNOSIS — I959 Hypotension, unspecified: Secondary | ICD-10-CM | POA: Diagnosis present

## 2014-08-06 DIAGNOSIS — I5042 Chronic combined systolic (congestive) and diastolic (congestive) heart failure: Secondary | ICD-10-CM | POA: Diagnosis present

## 2014-08-06 DIAGNOSIS — R51 Headache: Secondary | ICD-10-CM | POA: Diagnosis present

## 2014-08-06 DIAGNOSIS — F1010 Alcohol abuse, uncomplicated: Secondary | ICD-10-CM | POA: Diagnosis present

## 2014-08-06 DIAGNOSIS — R460 Very low level of personal hygiene: Secondary | ICD-10-CM | POA: Diagnosis present

## 2014-08-06 DIAGNOSIS — F329 Major depressive disorder, single episode, unspecified: Secondary | ICD-10-CM | POA: Diagnosis present

## 2014-08-06 DIAGNOSIS — I2510 Atherosclerotic heart disease of native coronary artery without angina pectoris: Secondary | ICD-10-CM

## 2014-08-06 DIAGNOSIS — R778 Other specified abnormalities of plasma proteins: Secondary | ICD-10-CM | POA: Diagnosis present

## 2014-08-06 DIAGNOSIS — Z79899 Other long term (current) drug therapy: Secondary | ICD-10-CM | POA: Diagnosis not present

## 2014-08-06 DIAGNOSIS — Z590 Homelessness: Secondary | ICD-10-CM | POA: Diagnosis not present

## 2014-08-06 DIAGNOSIS — B182 Chronic viral hepatitis C: Secondary | ICD-10-CM | POA: Diagnosis present

## 2014-08-06 DIAGNOSIS — T8131XD Disruption of external operation (surgical) wound, not elsewhere classified, subsequent encounter: Secondary | ICD-10-CM | POA: Diagnosis not present

## 2014-08-06 DIAGNOSIS — N390 Urinary tract infection, site not specified: Secondary | ICD-10-CM | POA: Diagnosis present

## 2014-08-06 DIAGNOSIS — F3110 Bipolar disorder, current episode manic without psychotic features, unspecified: Secondary | ICD-10-CM

## 2014-08-06 DIAGNOSIS — G20 Parkinson's disease: Secondary | ICD-10-CM | POA: Diagnosis present

## 2014-08-06 DIAGNOSIS — R7989 Other specified abnormal findings of blood chemistry: Secondary | ICD-10-CM | POA: Diagnosis present

## 2014-08-06 DIAGNOSIS — M1389 Other specified arthritis, multiple sites: Secondary | ICD-10-CM | POA: Diagnosis present

## 2014-08-06 DIAGNOSIS — I252 Old myocardial infarction: Secondary | ICD-10-CM | POA: Diagnosis not present

## 2014-08-06 DIAGNOSIS — I214 Non-ST elevation (NSTEMI) myocardial infarction: Secondary | ICD-10-CM | POA: Insufficient documentation

## 2014-08-06 DIAGNOSIS — F419 Anxiety disorder, unspecified: Secondary | ICD-10-CM | POA: Diagnosis present

## 2014-08-06 DIAGNOSIS — G2401 Drug induced subacute dyskinesia: Secondary | ICD-10-CM | POA: Diagnosis present

## 2014-08-06 LAB — URINALYSIS, ROUTINE W REFLEX MICROSCOPIC
Bilirubin Urine: NEGATIVE
Glucose, UA: NEGATIVE mg/dL
Ketones, ur: NEGATIVE mg/dL
Nitrite: POSITIVE — AB
PROTEIN: 30 mg/dL — AB
Specific Gravity, Urine: 1.011 (ref 1.005–1.030)
Urobilinogen, UA: 1 mg/dL (ref 0.0–1.0)
pH: 6.5 (ref 5.0–8.0)

## 2014-08-06 LAB — RAPID URINE DRUG SCREEN, HOSP PERFORMED
Amphetamines: NOT DETECTED
BENZODIAZEPINES: NOT DETECTED
Barbiturates: NOT DETECTED
COCAINE: NOT DETECTED
OPIATES: NOT DETECTED
TETRAHYDROCANNABINOL: NOT DETECTED

## 2014-08-06 LAB — URINE MICROSCOPIC-ADD ON

## 2014-08-06 LAB — MRSA PCR SCREENING: MRSA by PCR: NEGATIVE

## 2014-08-06 LAB — PROTIME-INR
INR: 1.11 (ref 0.00–1.49)
PROTHROMBIN TIME: 14.4 s (ref 11.6–15.2)

## 2014-08-06 LAB — TROPONIN I: TROPONIN I: 1.75 ng/mL — AB (ref <0.30)

## 2014-08-06 LAB — APTT: APTT: 30 s (ref 24–37)

## 2014-08-06 MED ORDER — THIAMINE HCL 100 MG/ML IJ SOLN
100.0000 mg | Freq: Every day | INTRAMUSCULAR | Status: DC
  Filled 2014-08-06 (×4): qty 1

## 2014-08-06 MED ORDER — ENOXAPARIN SODIUM 40 MG/0.4ML ~~LOC~~ SOLN
40.0000 mg | SUBCUTANEOUS | Status: DC
  Filled 2014-08-06: qty 0.4

## 2014-08-06 MED ORDER — FENTANYL 25 MCG/HR TD PT72: 25.0000 ug | MEDICATED_PATCH | TRANSDERMAL | Status: DC

## 2014-08-06 MED ORDER — CEFTRIAXONE SODIUM IN DEXTROSE 20 MG/ML IV SOLN
1.0000 g | INTRAVENOUS | Status: DC
  Administered 2014-08-06 – 2014-08-08 (×3): 1 g via INTRAVENOUS
  Filled 2014-08-06 (×3): qty 50

## 2014-08-06 MED ORDER — FOLIC ACID 1 MG PO TABS
1.0000 mg | ORAL_TABLET | Freq: Every day | ORAL | Status: DC
  Administered 2014-08-06 – 2014-08-09 (×4): 1 mg via ORAL
  Filled 2014-08-06 (×4): qty 1

## 2014-08-06 MED ORDER — POTASSIUM CHLORIDE CRYS ER 20 MEQ PO TBCR
40.0000 meq | EXTENDED_RELEASE_TABLET | Freq: Two times a day (BID) | ORAL | Status: AC
  Administered 2014-08-06 (×2): 40 meq via ORAL
  Filled 2014-08-06 (×2): qty 2

## 2014-08-06 MED ORDER — LORAZEPAM 1 MG PO TABS
0.0000 mg | ORAL_TABLET | Freq: Two times a day (BID) | ORAL | Status: DC
Start: 2014-08-08 — End: 2014-08-06

## 2014-08-06 MED ORDER — CARVEDILOL 3.125 MG PO TABS
3.1250 mg | ORAL_TABLET | Freq: Two times a day (BID) | ORAL | Status: DC
  Administered 2014-08-06: 3.125 mg via ORAL
  Filled 2014-08-06 (×5): qty 1

## 2014-08-06 MED ORDER — FUROSEMIDE 10 MG/ML IJ SOLN: 40.0000 mg | Freq: Once | INTRAMUSCULAR | Status: DC

## 2014-08-06 MED ORDER — LORAZEPAM 1 MG PO TABS
0.0000 mg | ORAL_TABLET | Freq: Four times a day (QID) | ORAL | Status: AC
Start: 2014-08-07 — End: 2014-08-08
  Administered 2014-08-08: 2 mg via ORAL
  Filled 2014-08-06: qty 2

## 2014-08-06 MED ORDER — NICOTINE 21 MG/24HR TD PT24
21.0000 mg | MEDICATED_PATCH | Freq: Once | TRANSDERMAL | Status: AC
  Administered 2014-08-06: 21 mg via TRANSDERMAL
  Filled 2014-08-06: qty 1

## 2014-08-06 MED ORDER — PERFLUTREN LIPID MICROSPHERE
INTRAVENOUS | Status: AC
  Administered 2014-08-06: 2 mL
  Filled 2014-08-06: qty 10

## 2014-08-06 MED ORDER — LORAZEPAM 1 MG PO TABS: 0.0000 mg | ORAL_TABLET | Freq: Two times a day (BID) | ORAL | Status: DC

## 2014-08-06 MED ORDER — MUPIROCIN CALCIUM 2 % EX CREA
TOPICAL_CREAM | Freq: Two times a day (BID) | CUTANEOUS | Status: DC
  Administered 2014-08-06 – 2014-08-09 (×5): via TOPICAL
  Filled 2014-08-06 (×2): qty 15

## 2014-08-06 MED ORDER — HEPARIN (PORCINE) IN NACL 100-0.45 UNIT/ML-% IJ SOLN
1100.0000 [IU]/h | INTRAMUSCULAR | Status: DC
  Administered 2014-08-06: 1100 [IU]/h via INTRAVENOUS
  Filled 2014-08-06: qty 250

## 2014-08-06 MED ORDER — LORAZEPAM 1 MG PO TABS
0.0000 mg | ORAL_TABLET | Freq: Four times a day (QID) | ORAL | Status: DC
Start: 2014-08-06 — End: 2014-08-06
  Administered 2014-08-06: 2 mg via ORAL
  Filled 2014-08-06: qty 2

## 2014-08-06 MED ORDER — ASPIRIN EC 81 MG PO TBEC
81.0000 mg | DELAYED_RELEASE_TABLET | Freq: Every day | ORAL | Status: DC
  Administered 2014-08-06 – 2014-08-09 (×4): 81 mg via ORAL
  Filled 2014-08-06 (×4): qty 1

## 2014-08-06 MED ORDER — VITAMIN B-1 100 MG PO TABS
100.0000 mg | ORAL_TABLET | Freq: Every day | ORAL | Status: DC
  Administered 2014-08-06 – 2014-08-09 (×4): 100 mg via ORAL
  Filled 2014-08-06 (×4): qty 1

## 2014-08-06 MED ORDER — LORAZEPAM 2 MG/ML IJ SOLN: 1.0000 mg | Freq: Four times a day (QID) | INTRAMUSCULAR | Status: AC | PRN

## 2014-08-06 MED ORDER — LORAZEPAM 1 MG PO TABS: 1.0000 mg | ORAL_TABLET | Freq: Four times a day (QID) | ORAL | Status: AC | PRN

## 2014-08-06 MED ORDER — FUROSEMIDE 10 MG/ML IJ SOLN
20.0000 mg | Freq: Once | INTRAMUSCULAR | Status: AC
  Administered 2014-08-06: 20 mg via INTRAVENOUS
  Filled 2014-08-06: qty 2

## 2014-08-06 MED ORDER — ADULT MULTIVITAMIN W/MINERALS CH
1.0000 | ORAL_TABLET | Freq: Every day | ORAL | Status: DC
  Administered 2014-08-06 – 2014-08-09 (×4): 1 via ORAL
  Filled 2014-08-06 (×4): qty 1

## 2014-08-06 MED ORDER — OXYCODONE-ACETAMINOPHEN 5-325 MG PO TABS
2.0000 | ORAL_TABLET | ORAL | Status: DC | PRN
  Administered 2014-08-06 – 2014-08-09 (×14): 2 via ORAL
  Filled 2014-08-06 (×14): qty 2

## 2014-08-06 MED ORDER — ENOXAPARIN SODIUM 40 MG/0.4ML ~~LOC~~ SOLN
40.0000 mg | SUBCUTANEOUS | Status: DC
  Administered 2014-08-06 – 2014-08-08 (×3): 40 mg via SUBCUTANEOUS
  Filled 2014-08-06 (×4): qty 0.4

## 2014-08-06 MED ORDER — ASPIRIN 81 MG PO CHEW
325.0000 mg | CHEWABLE_TABLET | Freq: Once | ORAL | Status: AC
  Administered 2014-08-06: 324 mg via ORAL
  Filled 2014-08-06: qty 5

## 2014-08-06 MED ORDER — HEPARIN SODIUM (PORCINE) 5000 UNIT/ML IJ SOLN: 5000.0000 [IU] | Freq: Three times a day (TID) | INTRAMUSCULAR | Status: DC

## 2014-08-06 MED ORDER — HEPARIN BOLUS VIA INFUSION
4000.0000 [IU] | Freq: Once | INTRAVENOUS | Status: AC
  Administered 2014-08-06: 4000 [IU] via INTRAVENOUS
  Filled 2014-08-06: qty 4000

## 2014-08-06 NOTE — ED Notes (Signed)
Pt is getting restless, complaining about not being comfortable, having difficulty falling asleep, having constant pain. He is requesting pain medications, offered tylenol as per PRN orders pt refused and became very agitated. Pt given ativan as per PRN orders for agitation and anxiety, will continue to monitor.

## 2014-08-06 NOTE — ED Notes (Addendum)
Pt observed chewing fentanyl patch, it was removed and thrown away. Pt is drowsy but arousable
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
## 2014-08-06 NOTE — Progress Notes (Addendum)
ANTICOAGULATION CONSULT NOTE - Initial Consult  Pharmacy Consult for Heparin Indication: chest pain/ACS  No Known Allergies  Patient Measurements: Height: 6\' 3"  (190.5 cm) Weight: 263 lb 3.7 oz (119.401 kg) IBW/kg (Calculated) : 84.5 Heparin Dosing Weight: 85.5 kg  Vital Signs: Temp: 98.7 F (37.1 C) (11/30 1951) Temp Source: Oral (11/30 1949) BP: 90/63 mmHg (12/01 0058) Pulse Rate: 105 (12/01 0058)  Labs:  Recent Labs  08/05/14 2018 08/06/14 0223  HGB 13.8  --   HCT 41.8  --   PLT 296  --   CREATININE 0.95  --   TROPONINI  --  1.75*    Estimated Creatinine Clearance: 121 mL/min (by C-G formula based on Cr of 0.95).   Medical History: Past Medical History  Diagnosis Date  . DDD (degenerative disc disease)   . Necrotizing fasciitis   . Degenerative disc disease   . Hypertension   . Degenerative disc disease   . Hepatitis C   . Chronic pain   . DDD (degenerative disc disease)   . Parkinson disease   . Mental disorder   . Depression   . DDD (degenerative disc disease)   . DDD (degenerative disc disease), lumbar   . Myocardial infarction acute     Medications:   (Not in a hospital admission) Scheduled:  . amLODipine  5 mg Oral Daily  . atorvastatin  80 mg Oral Daily  . cyclobenzaprine  10 mg Oral TID  . DULoxetine  60 mg Oral Daily  . isosorbide mononitrate  30 mg Oral Daily  . lisinopril  2.5 mg Oral Daily  . nicotine  21 mg Transdermal Once  . OLANZapine  10 mg Oral QHS  . ranolazine  500 mg Oral BID  . traZODone  100 mg Oral QHS   Infusions:    Assessment: 58yo M w/ paraplegia and chronic back pain brought from a homeless shelter by EMS w/ SI and pain. EKG and elevated troponin indicate ACS. Pharmacy is asked to dose heparin.     Goal of Therapy:  Heparin level 0.3-0.7 units/ml Monitor platelets by anticoagulation protocol: Yes   Plan:  Draw baseline aPTT and PT/INR. Give heparin 4000units IV bolus then start infusion at  1100units/hr. Check heparin level in 6hrs (8hrs if poor renal or elderly) and daily thereafter. Check CBC q24h while on heparin. F/u daily.  Charolotte Ekeom Marchele Decock, PharmD, pager [phone]. 08/06/2014,3:54 AM.

## 2014-08-06 NOTE — Consult Note (Signed)
WOC wound consult note Reason for Consult: Consult requested for several wounds. Wound type: Left hip with previous incision line scar which has re-opened in the middle; full thickness 1X.3X.1cm; 100% pink and dry.  No odor or drainage. Left upper calf near knee with previous full thickness wound which has evolved into a scab; 1.8X.3X.1cm, black and dry without odor or drainage. Left achilles area above heel with previous full thickness wound which has evolved into a scab; 2X.3X.1cm, black and dry without odor or drainage. Dressing procedure/placement/frequency: Bactroban to provide antimicrobial benefits and promote moist healing.  Foam dressing to protect from further injury. Please re-consult if further assistance is needed.  Thank-you,  Cammie Mcgeeawn Zebbie Ace MSN, RN, CWOCN, [place], CNS [phone]

## 2014-08-06 NOTE — H&P (Signed)
CARDIOLOGY ADMISSION/CONSULT NOTE  Assessment and Plan:  *Non ST elevation myocardial infarction: Jeffrey [name] is a 58 year old male with multiple medical problems including likely CAD, chronic pain, hypertension, hep C was initially brought to the emergency department with suicidal ideations. After being evaluated by geropsychiatry patient had further medical clearance labs which included troponins. The troponin levels returned elevated at 1.75. An EKG subsequently showed Q waves in the anterolateral leads with T-wave inversions suggestive of non-ST elevation myocardial infarction. Patient is a very poor historian. He states that he has pain everywhere including legs and his back but endorses no chest pain, pressure. Patient also likely has history of coronary artery disease and reportedly had a myocardial infarction in January 2015 at [facility]. Details of this are unclear to Jeffrey [name] at this time. In addition, his medication regimen suggests a patient with chronic chest pains.  -- Continue aspirin 81 mg daily -- Continue atorvastatin 80 mg daily -- Continue heparin drip for ACS management -- Continue isosorbide mononitrate 30 mg daily Continue lisinopril 2.5 mg daily -- Continue Ranexa 500 mg by mouth twice a day. -- Transthoracic echocardiogram in the morning -- Consider left heart catheterization in the morning -- We'll start metoprolol tartrate 12.5 mg by mouth twice a day  *Hypertension: -- Currently well controlled on lisinopril, Imdur and amlodipine. We'll start metoprolol tartrate. Consider down titrating amlodipine if hypotension becomes an issue  *Chronic pain: At homeless shelter, patient uses fentanyl patches. However, he was found to be chewing the patches in the emergency department. -- For now will likely continue anti-inflammatory medications such as Tylenol.   Chief complaint: suicidal ideations   HPI:  Jeffrey [name] is a 58 year old male with history of CAD, chronic pain,  hypertension, hepatitis C, paraplegia was initially brought to the [name] with symptoms of suicidal ideation. Patient reportedly endorses symptoms of suicidal ideation after staff at the homeless shelter was going to take him out because of poor hygiene. Patient reportedly has difficult time with ambulation and is unable to care for himself with ADLs especially with urination and reportedly often soils himself. Therefore patient states that he often smells of urine. Patient has become depressed due to complaints of poor hygiene and possibly not being able to stay at the homeless shelter. Therefore he was figured about overdosing on the pills. He was evaluated by geropsychiatric at [facility] who recommended inpatient admission and workup. As a part of workup, patient had a troponin checked which returned elevated at 1.75. An EKG subsequently showed Q waves in the anterior leads with T-wave inversions. Patient endorses chronic pain but denies any chest pain, chest pressure. He states that he has severe chronic low back and leg pain.   In the ED, patient was noted to be chewing his fentanyl patch.  Cardiac history:  CAD. Reportedly had an MI on 09/12/2013 at [name]. No stents were placed. "Left to heal on its own"  Hypertension  Previous cardiac imaging  EKG: Normal sinus rhythm with Q waves in the anterolateral leads   Past Medical History Past Medical History  Diagnosis Date  . DDD (degenerative disc disease)   . Necrotizing fasciitis   . Degenerative disc disease   . Hypertension   . Degenerative disc disease   . Hepatitis C   . Chronic pain   . DDD (degenerative disc disease)   . Parkinson disease   . Mental disorder   . Depression   . DDD (degenerative disc disease)   .  DDD (degenerative disc disease), lumbar   . Myocardial infarction acute     Allergies: No Known Allergies  Social History History   Social History  . Marital Status: Single    Spouse  Name: N/A    Number of Children: N/A  . Years of Education: N/A   Occupational History  . Not on file.   Social History Main Topics  . Smoking status: Current Every Day Smoker -- 1.00 packs/day    Types: Cigarettes    Last Attempt to Quit: 11/27/2012  . Smokeless tobacco: Current User    Types: Chew  . Alcohol Use: 1.5 oz/week     Comment: anything he can get his hands on  . Drug Use: No  . Sexual Activity: Not on file   Other Topics Concern  . Not on file   Social History Narrative   ** Merged History Encounter **        Family History History reviewed. No pertinent family history.  Physical Exam Filed Vitals:   08/06/14 0446  BP: 112/97  Pulse: 92  Temp:   Resp: 18     Gen: Appears comfortable acute distress  HEENT:Moist mucous membranes  Neck:Supple, no JVD  ZO:XWRUEAV rate rhythm, normal S1, normal S2, +2 pulses in bilateral upper extremities  Pulm:Clear to auscultation bilaterally in the anterior lung fields  Abdomen:Soft nontender nondistended  WUJ:WJXBJ ulceration on the left hip and no erythema or purulence. Abnormal muscle tone in the bilateral lower extremities.  Labs:  Results for orders placed or performed during the [name] encounter of 08/05/14 (from the past 24 hour(s))  CBC     Status: Abnormal   Collection Time: 08/05/14  8:18 PM  Result Value Ref Range   WBC 13.0 (H) 4.0 - 10.5 K/uL   RBC 4.94 4.22 - 5.81 MIL/uL   Hemoglobin 13.8 13.0 - 17.0 g/dL   HCT 47.8 29.5 - 62.1 %   MCV 84.6 78.0 - 100.0 fL   MCH 27.9 26.0 - 34.0 pg   MCHC 33.0 30.0 - 36.0 g/dL   RDW 30.8 65.7 - 84.6 %   Platelets 296 150 - 400 K/uL  Comprehensive metabolic panel     Status: Abnormal   Collection Time: 08/05/14  8:18 PM  Result Value Ref Range   Sodium 144 137 - 147 mEq/L   Potassium 3.7 3.7 - 5.3 mEq/L   Chloride 103 96 - 112 mEq/L   CO2 21 19 - 32 mEq/L   Glucose, Bld 72 70 - 99 mg/dL   BUN 12 6 - 23 mg/dL   Creatinine, Ser 9.62 0.50 - 1.35 mg/dL    Calcium 9.5 8.4 - 95.2 mg/dL   Total Protein 7.6 6.0 - 8.3 g/dL   Albumin 3.5 3.5 - 5.2 g/dL   AST 16 0 - 37 U/L   ALT 7 0 - 53 U/L   Alkaline Phosphatase 83 39 - 117 U/L   Total Bilirubin 0.3 0.3 - 1.2 mg/dL   GFR calc non Af Amer >90 >90 mL/min   GFR calc Af Amer >90 >90 mL/min   Anion gap 20 (H) 5 - 15  Ethanol (ETOH)     Status: None   Collection Time: 08/05/14  8:18 PM  Result Value Ref Range   Alcohol, Ethyl (B) <11 0 - 11 mg/dL  Acetaminophen level     Status: None   Collection Time: 08/05/14  8:18 PM  Result Value Ref Range   Acetaminophen (Tylenol), Serum <15.0 10 -  30 ug/mL  Salicylate level     Status: Abnormal   Collection Time: 08/05/14  8:18 PM  Result Value Ref Range   Salicylate Lvl <2.0 (L) 2.8 - 20.0 mg/dL  Urine rapid drug screen (hosp performed)     Status: None   Collection Time: 08/05/14 11:11 PM  Result Value Ref Range   Opiates NONE DETECTED NONE DETECTED   Cocaine NONE DETECTED NONE DETECTED   Benzodiazepines NONE DETECTED NONE DETECTED   Amphetamines NONE DETECTED NONE DETECTED   Tetrahydrocannabinol NONE DETECTED NONE DETECTED   Barbiturates NONE DETECTED NONE DETECTED  Urinalysis, Routine w reflex microscopic     Status: Abnormal   Collection Time: 08/05/14 11:11 PM  Result Value Ref Range   Color, Urine YELLOW YELLOW   APPearance TURBID (A) CLEAR   Specific Gravity, Urine 1.011 1.005 - 1.030   pH 6.5 5.0 - 8.0   Glucose, UA NEGATIVE NEGATIVE mg/dL   Hgb urine dipstick MODERATE (A) NEGATIVE   Bilirubin Urine NEGATIVE NEGATIVE   Ketones, ur NEGATIVE NEGATIVE mg/dL   Protein, ur 30 (A) NEGATIVE mg/dL   Urobilinogen, UA 1.0 0.0 - 1.0 mg/dL   Nitrite POSITIVE (A) NEGATIVE   Leukocytes, UA LARGE (A) NEGATIVE  Urine microscopic-add on     Status: Abnormal   Collection Time: 08/05/14 11:11 PM  Result Value Ref Range   Squamous Epithelial / LPF RARE RARE   WBC, UA TOO NUMEROUS TO COUNT <3 WBC/hpf   RBC / HPF 11-20 <3 RBC/hpf   Bacteria, UA MANY  (A) RARE  Troponin I     Status: Abnormal   Collection Time: 08/06/14  2:23 AM  Result Value Ref Range   Troponin I 1.75 (HH) <0.30 ng/mL  APTT     Status: None   Collection Time: 08/06/14  4:00 AM  Result Value Ref Range   aPTT 30 24 - 37 seconds  Protime-INR     Status: None   Collection Time: 08/06/14  4:00 AM  Result Value Ref Range   Prothrombin Time 14.4 11.6 - 15.2 seconds   INR 1.11 0.00 - 1.49

## 2014-08-06 NOTE — Consult Note (Signed)
[facility] Medical Consultation  Brooke BonitoDean S Marinos IRC:[id number] DOB: September 04, 1956 DOA: 08/05/2014 PCP: No primary care provider on file.   Requesting physician: Dr. Clarise CruzBenshimon Date of consultation: 5312.1.2015 Reason for consultation: Suicidal ideation  Impression/Recommendations Suicidal ideation/DEPRESSION/ANXIETY/Bipolar disorder: - She relates wanting to take his life as he doesn't have a place to live. He relates he's being take out of his shelter. - I will go ahead and called psych for possible inpatient therapy. - We'll be glad to take over this patient's care. Appreciate try to get records from the walked to further delineate his coronary artery disease status and recent procedures.  Elevated Troponin's: - Trying to obtain records from [facility]. - He relates he had a heart attack up a couple weeks ago, his EKG showed some ST elevation in the anterior leads. He denies any chest pain or shortness of breath. - Cardiology is following, continue to trend cardiac enzymes echo is pending. - He has positive JVD and crackles on the right, I think he has a third heart sound. - He seems to be mild volume overloaded, I will start him on Lasix IV echo has been ordered by cardiology. - He is currently on a beta blocker, aspirin or statin, BiDil and ACE inhibitor  UTI: - His UA showed large amount of blood to many bacteria and T2-weighted count white blood cells. - If his urine smells bad, I'll go ahead and send a urine culture. Start him empirically on Rocephin. - His blood pressure and heart rate seems to be stable.  Multiple skin ulcers: - On the left leg will get wound care consult.  HYPERTENSION, BENIGN ESSENTIAL - Seems to be well controlled continue current therapy. - He is on Coreg, BiDil, and ACE inhibitor. - Hold Norvasc  Low back pain - Recent femoral patch and when necessary narcotics.  ETOH abuse - Monitor him with serial protocol.    Chief Complaint:  Suicidal ideation  HPI:  58 year old male with past nuchal history of chronic artery disease, recently admitted to no one with an MI, the details of this cardiac cath are still pending and were getting records, hypertension hepatitis C and paraplegia who is brought in to [facility] with symptoms of suicidal ideation. He reports he was to take his life as the staff at the shelter where he lives wants to kick about. He relates he has a difficult time ambulating and is unable to care for himself. He has been stooling and urinating on himself. He also relates a bad smell of his urine. There is no dysuria or fever. He relates he doesn't enjoy much of the things he do as he used to. He also relays he has been waking up very early, doesn't have a good appetite. He denies any chest pain shortness of breath or angina equivalents.  In the ED: Troponins were checked and they're 1.7. He denies any chest pain or shortness of breath, cardiology admitted the patient reviewed EKG and follow-up this might be due to an old infarct. At this time we are trying to get records from [facility]. Cardiology has consulted us to manage his other medical problems.  Review of Systems:  Constitutional:  No weight loss, night sweats, Fevers, chills, fatigue.  HEENT:  No headaches, Difficulty swallowing,Tooth/dental problems,Sore throat,  No sneezing, itching, ear ache, nasal congestion, post nasal drip,  Cardio-vascular:  No chest pain, Orthopnea, PND, swelling in lower extremities, anasarca, dizziness, palpitations  GI:  No heartburn, indigestion, abdominal pain, nausea, vomiting,  diarrhea, change in bowel habits, loss of appetite  Resp:  No shortness of breath with exertion or at rest. No excess mucus, no productive cough, No non-productive cough, No coughing up of blood.No change in color of mucus.No wheezing.No chest wall deformity  Skin:  no rash or lesions.  GU:  no dysuria, change in color of urine, no  urgency or frequency. No flank pain.  Musculoskeletal:  No joint pain or swelling. No decreased range of motion. No back pain.  Psych:  He appears with a flat affect, he relates he doesn't enjoy the things that he used to as he did in the past.   Past Medical History  Diagnosis Date  . DDD (degenerative disc disease)   . Necrotizing fasciitis   . Degenerative disc disease   . Hypertension   . Degenerative disc disease   . Hepatitis C   . Chronic pain   . DDD (degenerative disc disease)   . Parkinson disease   . Mental disorder   . Depression   . DDD (degenerative disc disease)   . DDD (degenerative disc disease), lumbar   . Myocardial infarction acute    Past Surgical History  Procedure Laterality Date  . Hip surgery    . Appendectomy    . Ankle surgery    . Incision and drainage of wound N/A 11/26/2012    Procedure: IRRIGATION AND DEBRIDEMENT WOUND;  Surgeon: Emelia Loron, MD;  Location: [facility] OR;  Service: General;  Laterality: N/A;  . Wound exploration N/A 11/26/2012    Procedure: WOUND EXPLORATION;  Surgeon: Emelia Loron, MD;  Location: [facility] OR;  Service: General;  Laterality: N/A;   Social History:  reports that he has been smoking Cigarettes.  He has been smoking about 1.00 pack per day. His smokeless tobacco use includes Chew. He reports that he drinks about 1.5 oz of alcohol per week. He reports that he does not use illicit drugs.  No Known Allergies History reviewed. No pertinent family history.  Prior to Admission medications   Medication Sig Start Date End Date Taking? Authorizing Provider  amLODipine (NORVASC) 5 MG tablet Take 1 tablet (5 mg total) by mouth daily. 03/30/13  Yes Jacquelin Hawking, MD  atorvastatin (LIPITOR) 80 MG tablet Take 80 mg by mouth daily.   Yes Historical Provider, MD  cyclobenzaprine (FLEXERIL) 10 MG tablet Take 10 mg by mouth 3 (three) times daily.   Yes Historical Provider, MD  DULoxetine (CYMBALTA) 60 MG capsule Take 1 capsule (60 mg  total) by mouth daily. For depression/pain control 02/26/13  Yes Sanjuana Kava, NP  fentaNYL (DURAGESIC - DOSED MCG/HR) 25 MCG/HR patch Place 25 mcg onto the skin every 3 (three) days.   Yes Historical Provider, MD  isosorbide mononitrate (IMDUR) 30 MG 24 hr tablet Take 30 mg by mouth daily.   Yes Historical Provider, MD  lisinopril (PRINIVIL,ZESTRIL) 2.5 MG tablet Take 2.5 mg by mouth daily.   Yes Historical Provider, MD  OLANZapine (ZYPREXA) 10 MG tablet Take 10 mg by mouth at bedtime.   Yes Historical Provider, MD  oxyCODONE-acetaminophen (PERCOCET) 5-325 MG per tablet Take 2 tablets by mouth every 4 (four) hours as needed. 08/04/14  Yes Gilda Crease, MD  ranolazine (RANEXA) 500 MG 12 hr tablet Take 500 mg by mouth daily.    Yes Historical Provider, MD  traZODone (DESYREL) 100 MG tablet Take 100 mg by mouth at bedtime.   Yes Historical Provider, MD   Physical Exam: Blood pressure 107/65, pulse  65, temperature 98.1 F (36.7 C), temperature source Oral, resp. rate 19, height 6\' 3"  (1.905 m), weight 119.401 kg (263 lb 3.7 oz), SpO2 100 %. Filed Vitals:   08/06/14 0600  BP: 107/65  Pulse: 65  Temp: 98.1 F (36.7 C)  Resp: 19     General:  He is awake alert and oriented 3, appears disheveled  Eyes: Anicteric  ENT: Moist mucous membranes  Neck: Positive JVD  Cardiovascular: Rate and rhythm possibly an S3 and positive S1-S2  Respiratory: Good air movement with crackles on the right.  Abdomen: Positive bowel sounds mild suprapubic tenderness no rebound or guarding  Skin: He has 2 skin ulcers on the left knee lateral side and posterior ankle  Musculoskeletal: Intact  Psychiatric: Flat affect  Labs on Admission:  Basic Metabolic Panel:  Recent Labs Lab 08/05/14 2018  NA 144  K 3.7  CL 103  CO2 21  GLUCOSE 72  BUN 12  CREATININE 0.95  CALCIUM 9.5   Liver Function Tests:  Recent Labs Lab 08/05/14 2018  AST 16  ALT 7  ALKPHOS 83  BILITOT 0.3  PROT 7.6   ALBUMIN 3.5   No results for input(s): LIPASE, AMYLASE in the last 168 hours. No results for input(s): AMMONIA in the last 168 hours. CBC:  Recent Labs Lab 08/05/14 2018  WBC 13.0*  HGB 13.8  HCT 41.8  MCV 84.6  PLT 296   Cardiac Enzymes:  Recent Labs Lab 08/06/14 0223  TROPONINI 1.75*   BNP: Invalid input(s): POCBNP CBG: No results for input(s): GLUCAP in the last 168 hours.  Radiological Exams on Admission: Dg Chest 1 View  08/06/2014   CLINICAL DATA:  Shortness of breath and chest pain. Initial encounter.  EXAM: CHEST - 1 VIEW  COMPARISON:  03/15/2013  FINDINGS: The cardiomediastinal silhouette is unremarkable.  This is a low volume film.  There is no evidence of focal airspace disease, pulmonary edema, suspicious pulmonary nodule/mass, pleural effusion, or pneumothorax. No acute bony abnormalities are identified.  IMPRESSION: No active disease.   Electronically Signed   By: Laveda AbbeJeff  Hu M.D.   On: 08/06/2014 01:29    EKG: Independently reviewed: Mild ST segment elevation in anterior leads, sinus rhythm with left axis deviation, also Q waves in the anterior lead.  Time spent: 90 minutes  Marinda ElkFELIZ ORTIZ, ABRAHAM [facility] Pager [phone]  If 7PM-7AM, please contact night-coverage [email or web address] Password [email or web address] 08/06/2014, 8:03 AM
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
## 2014-08-06 NOTE — Progress Notes (Signed)
[facility]. NSTEMI but patient denies any pain or chest pressure. Patient not willing to answer admit questions on admission, very poor historian. Patient homeless and [facility]. Patient uses battery operated wheelchair. Patient unable to provide care for himself and has suicide ideations on admission. Room prepared for suicide precautions and sitter in room. VSS see flowsheet. Heparin gtt infusing on admit at 1100 units. Dr. Gala RomneyBensimhon notified of admission. Will continue to monitor.

## 2014-08-06 NOTE — Care Management Note (Addendum)
    Page 1 of 1   08/08/2014     12:44:25 PM CARE MANAGEMENT NOTE 08/08/2014  Patient:  Jeffrey [name],Jeffrey [name]   Account Number:  [id number]  Date Initiated:  08/06/2014  Documentation initiated by:  Junius CreamerWELL,DEBBIE  Subjective/Objective Assessment:   adm w suicidal ideation and nstemi     Action/Plan:   homeless per chart   Anticipated DC Date:  08/08/2014   Anticipated DC Plan:  [facility]  In-house referral  Clinical Social Worker      DC Planning Services  CM consult      Choice offered to / List presented to:             Status of service:  In process, will continue to follow Medicare Important Message given?  NO (If response is "NO", the following Medicare IM given date fields will be blank) Date Medicare IM given:   Medicare IM given by:   Date Additional Medicare IM given:   Additional Medicare IM given by:    Discharge Disposition:    Per UR Regulation:  Reviewed for med. necessity/level of care/duration of stay  If discussed at Long Length of Stay Meetings, dates discussed:    Comments:  08/08/14 1243 Letha Capeeborah Dellamae Rosamilia RN, BSN [phone] [id number] patient is for possbile dc today, CSW working patient to see if he will want to go to ALF.  08/07/14  1450 Letha Capeeborah Latif Nazareno RN, BSN [phone] NCM spoke with patient , he states he would like to go to [facility], NCM wiill call to try to make appt tommorow, because they are full today.  12/1 1054a debbie dowell rn,bsn pt for transf to tel floor. sw for psych issues and homeless issues.

## 2014-08-06 NOTE — Progress Notes (Addendum)
  Echocardiogram 2D Echocardiogram with Definity has been performed.  Dary Dilauro FRANCES 08/06/2014, 2:50 PM

## 2014-08-06 NOTE — ED Notes (Signed)
Troponin of 1.75 reported to Dr Norlene Campbelltter by Mervyn SkeetersA Ward, RN

## 2014-08-06 NOTE — ED Provider Notes (Addendum)
EKG ordered in anticipation of geripsych placement.  Initial EKG with abnormal axis and T waves.  I asked the tech to repeat the EKG and realign the leads.  Tech reports that she found the patient chewing on his fentanyl patch.  I will discontinue his statin no as he is using it inappropriately.   EKG Interpretation  Date/Time:  Tuesday August 06 2014 02:01:23 EST Ventricular Rate:  100 PR Interval:  164 QRS Duration: 101 QT Interval:  394 QTC Calculation: 508 R Axis:   -73 Text Interpretation:  Fast sinus arrhythmia Left anterior fascicular block Anterolateral infarct, age indeterminate Abnormal T, consider ischemia, lateral leads Prolonged QT interval axis changed from prior, with abnormal T waves Confirmed by Mandy Peeks  MD, Zaion Hreha ([id number]) on 08/06/2014 2:09:07 AM         EKG Interpretation  Date/Time:  Tuesday August 06 2014 02:14:34 EST Ventricular Rate:  98 PR Interval:  160 QRS Duration: 105 QT Interval:  377 QTC Calculation: 481 R Axis:   -72 Text Interpretation:  Sinus tachycardia Supraventricular bigeminy Left anterior fascicular block Anterolateral infarct, age indeterminate inverted t waves new from prior Confirmed by Yaritza Leist  MD, Lenyx Boody ([id number]) on 08/06/2014 2:53:27 AM        Olivia Mackielga M Gae Bihl, MD 08/06/14 0253  3:27 AM Pt with elevated tropoinin.  Pt is very poor historian, reports that he had a heart [facility], no stents reportedly placed that was "left to heal on its own".  Unable to find records of visit.  He reports "twinge" of chest pain earlier today, no pain at present.  Will give aspirin, start on heparin and d/w cardiology.  Olivia Mackielga M Marrah Vanevery, MD 08/06/14 [phone]

## 2014-08-06 NOTE — H&P (Addendum)
HPI:  [name] is a 58 year old male with history of CAD, chronic pain, hypertension, hepatitis C, paraplegia was initially brought to the [facility] with symptoms of suicidal ideation. Patient reportedly endorses symptoms of suicidal ideation after staff at the homeless shelter was going to take him out because of poor hygiene. Patient reportedly has difficult time with ambulation and is unable to care for himself with ADLs especially with urination and reportedly often soils himself. Therefore patient states that he often smells of urine. Patient has become depressed due to complaints of poor hygiene and possibly not being able to stay at the homeless shelter. Therefore he was figured about overdosing on the pills. He was evaluated by [facility]iatric at [facility] who recommended inpatient admission and workup. As a part of workup, patient had a troponin checked which returned elevated at 1.75. An EKG subsequently showed Q waves in the anterior leads with T-wave inversions. Patient endorses chronic pain but denies any chest pain, chest pressure or HF symptoms. He states that he has severe chronic low back and leg pain.   In the ED, patient was noted to be chewing his fentanyl patch.  He is limited historian but on talking to him he said he had a big heart attack early this month and was treated at "[facility]" (meds are from [place]). He tells me "they ran the thing up inside me and told me my heart just needed to heal." No stent was placed.    Review of Systems:     Cardiac Review of Systems: {Y] = yes [ ]  = no  Chest Pain [    ]  Resting SOB [   ] Exertional SOB  [  ]  Orthopnea [  ]   Pedal Edema [   ]    Palpitations [  ] Syncope  [  ]   Presyncope [   ]  General Review of Systems: [Y] = yes [  ]=no Constitional: recent weight change [  ]; anorexia [  ]; fatigue [  ]; nausea [  ]; night sweats [  ]; fever [  ]; or chills [  ];                                                                      Dental: poor dentition[ y ];   Eye : blurred vision [  ]; diplopia [   ]; vision changes [  ];  Amaurosis fugax[  ]; Resp: cough [  ];  wheezing[  ];  hemoptysis[  ]; shortness of breath[  ]; paroxysmal nocturnal dyspnea[  ]; dyspnea on exertion[  ]; or orthopnea[  ];  GI:  gallstones[  ], vomiting[  ];  dysphagia[  ]; melena[  ];  hematochezia [  ]; heartburn[  ];    GU: kidney stones [  ]; hematuria[  ];   dysuria [  ];  nocturia[  ];               Skin: rash [  ], swelling[  ];, hair loss[  ];  peripheral edema[  ];  or itching[  ]; Musculosketetal: myalgias[  y];  joint swelling[  ];  joint erythema[  ];  joint  pain[ y ];  back pain[y  ];  Heme/Lymph: bruising[  ];  bleeding[  ];  anemia[  ];  Neuro: TIA[  ];  headaches[  ];  stroke[  ];  vertigo[  ];  seizures[  ];   paresthesias[  ];  difficulty walking[  y];  Psych:depression[ y ]; anxiety[  ];  Endocrine: diabetes[  ];  thyroid dysfunction[  ];  Other:  Past Medical History  Diagnosis Date  . DDD (degenerative disc disease)   . Necrotizing fasciitis   . Degenerative disc disease   . Hypertension   . Degenerative disc disease   . Hepatitis C   . Chronic pain   . DDD (degenerative disc disease)   . Parkinson disease   . Mental disorder   . Depression   . DDD (degenerative disc disease)   . DDD (degenerative disc disease), lumbar   . Myocardial infarction acute     Prior to Admission medications   Medication Sig Start Date End Date Taking? Authorizing Provider  amLODipine (NORVASC) 5 MG tablet Take 1 tablet (5 mg total) by mouth daily. 03/30/13  Yes Jacquelin Hawkingalph Nettey, MD  atorvastatin (LIPITOR) 80 MG tablet Take 80 mg by mouth daily.   Yes Historical Provider, MD  cyclobenzaprine (FLEXERIL) 10 MG tablet Take 10 mg by mouth 3 (three) times daily.   Yes Historical Provider, MD  DULoxetine (CYMBALTA) 60 MG capsule Take 1 capsule (60 mg total) by mouth daily. For depression/pain control 02/26/13  Yes Sanjuana KavaAgnes I Nwoko, NP    fentaNYL (DURAGESIC - DOSED MCG/HR) 25 MCG/HR patch Place 25 mcg onto the skin every 3 (three) days.   Yes Historical Provider, MD  isosorbide mononitrate (IMDUR) 30 MG 24 hr tablet Take 30 mg by mouth daily.   Yes Historical Provider, MD  lisinopril (PRINIVIL,ZESTRIL) 2.5 MG tablet Take 2.5 mg by mouth daily.   Yes Historical Provider, MD  OLANZapine (ZYPREXA) 10 MG tablet Take 10 mg by mouth at bedtime.   Yes Historical Provider, MD  oxyCODONE-acetaminophen (PERCOCET) 5-325 MG per tablet Take 2 tablets by mouth every 4 (four) hours as needed. 08/04/14  Yes Gilda Creasehristopher J. Pollina, MD  ranolazine (RANEXA) 500 MG 12 hr tablet Take 500 mg by mouth daily.    Yes Historical Provider, MD  traZODone (DESYREL) 100 MG tablet Take 100 mg by mouth at bedtime.   Yes Historical Provider, MD     No Known Allergies  History   Social History  . Marital Status: Single    Spouse Name: N/A    Number of Children: N/A  . Years of Education: N/A   Occupational History  . Not on file.   Social History Main Topics  . Smoking status: Current Every Day Smoker -- 1.00 packs/day    Types: Cigarettes    Last Attempt to Quit: 11/27/2012  . Smokeless tobacco: Current User    Types: Chew  . Alcohol Use: 1.5 oz/week     Comment: anything he can get his hands on  . Drug Use: No  . Sexual Activity: Not on file   Other Topics Concern  . Not on file   Social History Narrative   ** Merged History Encounter **        History reviewed. No pertinent family history.  PHYSICAL EXAM: Filed Vitals:   08/06/14 0600  BP: 107/65  Pulse: 65  Temp: 98.1 F (36.7 C)  Resp: 19   General:  Unkempt. Lying in bed. Very lethargic but arouses to  questions and falls back asleep HEENT: normal Neck: supple. no JVP 6-7 Carotids 2+ bilat; no bruits. No lymphadenopathy or thryomegaly appreciated. Cor: PMI nondisplaced. Regular rate & rhythm. No rubs, gallops or murmurs. Lungs: clear Abdomen: soft, nontender,  nondistended. No hepatosplenomegaly. No bruits or masses. Good bowel sounds. Extremities: no cyanosis, clubbing  Both LEs very weak L>R  Several wounds on L leg  Neuro: Lethargic but oriented. CNs intact. Flat affect. Both legs weak  ECG: NSR with anterior Qs and mild persistent ST elevation c/w evolving subacute anterior MI  Results for orders placed or performed during the hospital encounter of 08/05/14 (from the past 24 hour(s))  CBC     Status: Abnormal   Collection Time: 08/05/14  8:18 PM  Result Value Ref Range   WBC 13.0 (H) 4.0 - 10.5 K/uL   RBC 4.94 4.22 - 5.81 MIL/uL   Hemoglobin 13.8 13.0 - 17.0 g/dL   HCT 16.1 09.6 - 04.5 %   MCV 84.6 78.0 - 100.0 fL   MCH 27.9 26.0 - 34.0 pg   MCHC 33.0 30.0 - 36.0 g/dL   RDW 40.9 81.1 - 91.4 %   Platelets 296 150 - 400 K/uL  Comprehensive metabolic panel     Status: Abnormal   Collection Time: 08/05/14  8:18 PM  Result Value Ref Range   Sodium 144 137 - 147 mEq/L   Potassium 3.7 3.7 - 5.3 mEq/L   Chloride 103 96 - 112 mEq/L   CO2 21 19 - 32 mEq/L   Glucose, Bld 72 70 - 99 mg/dL   BUN 12 6 - 23 mg/dL   Creatinine, Ser 7.82 0.50 - 1.35 mg/dL   Calcium 9.5 8.4 - 95.6 mg/dL   Total Protein 7.6 6.0 - 8.3 g/dL   Albumin 3.5 3.5 - 5.2 g/dL   AST 16 0 - 37 U/L   ALT 7 0 - 53 U/L   Alkaline Phosphatase 83 39 - 117 U/L   Total Bilirubin 0.3 0.3 - 1.2 mg/dL   GFR calc non Af Amer >90 >90 mL/min   GFR calc Af Amer >90 >90 mL/min   Anion gap 20 (H) 5 - 15  Ethanol (ETOH)     Status: None   Collection Time: 08/05/14  8:18 PM  Result Value Ref Range   Alcohol, Ethyl (B) <11 0 - 11 mg/dL  Acetaminophen level     Status: None   Collection Time: 08/05/14  8:18 PM  Result Value Ref Range   Acetaminophen (Tylenol), Serum <15.0 10 - 30 ug/mL  Salicylate level     Status: Abnormal   Collection Time: 08/05/14  8:18 PM  Result Value Ref Range   Salicylate Lvl <2.0 (L) 2.8 - 20.0 mg/dL  Urine rapid drug screen (hosp performed)     Status: None    Collection Time: 08/05/14 11:11 PM  Result Value Ref Range   Opiates NONE DETECTED NONE DETECTED   Cocaine NONE DETECTED NONE DETECTED   Benzodiazepines NONE DETECTED NONE DETECTED   Amphetamines NONE DETECTED NONE DETECTED   Tetrahydrocannabinol NONE DETECTED NONE DETECTED   Barbiturates NONE DETECTED NONE DETECTED  Urinalysis, Routine w reflex microscopic     Status: Abnormal   Collection Time: 08/05/14 11:11 PM  Result Value Ref Range   Color, Urine YELLOW YELLOW   APPearance CLOUDY (A) CLEAR   Specific Gravity, Urine 1.011 1.005 - 1.030   pH 6.5 5.0 - 8.0   Glucose, UA NEGATIVE NEGATIVE mg/dL   Hgb  urine dipstick MODERATE (A) NEGATIVE   Bilirubin Urine NEGATIVE NEGATIVE   Ketones, ur NEGATIVE NEGATIVE mg/dL   Protein, ur 30 (A) NEGATIVE mg/dL   Urobilinogen, UA 1.0 0.0 - 1.0 mg/dL   Nitrite POSITIVE (A) NEGATIVE   Leukocytes, UA LARGE (A) NEGATIVE  Urine microscopic-add on     Status: Abnormal   Collection Time: 08/05/14 11:11 PM  Result Value Ref Range   Squamous Epithelial / LPF RARE RARE   WBC, UA TOO NUMEROUS TO COUNT <3 WBC/hpf   RBC / HPF 11-20 <3 RBC/hpf   Bacteria, UA MANY (A) RARE  Troponin I     Status: Abnormal   Collection Time: 08/06/14  2:23 AM  Result Value Ref Range   Troponin I 1.75 (HH) <0.30 ng/mL  APTT     Status: None   Collection Time: 08/06/14  4:00 AM  Result Value Ref Range   aPTT 30 24 - 37 seconds  Protime-INR     Status: None   Collection Time: 08/06/14  4:00 AM  Result Value Ref Range   Prothrombin Time 14.4 11.6 - 15.2 seconds   INR 1.11 0.00 - 1.49   Dg Chest 1 View  08/06/2014   CLINICAL DATA:  Shortness of breath and chest pain. Initial encounter.  EXAM: CHEST - 1 VIEW  COMPARISON:  03/15/2013  FINDINGS: The cardiomediastinal silhouette is unremarkable.  This is a low volume film.  There is no evidence of focal airspace disease, pulmonary edema, suspicious pulmonary nodule/mass, pleural effusion, or pneumothorax. No acute bony  abnormalities are identified.  IMPRESSION: No active disease.   Electronically Signed   By: Laveda Abbe M.D.   On: 08/06/2014 01:29     ASSESSMENT: 1. CAD with probable anterior MI several weeks ago 2. Depression with suicide attempt 3. 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 HCV 5. Homelessness 6. Tobacco abuse 7. Chronic pain syndrome  PLAN/DISCUSSION:  Mr. Samek is a difficult historian but from what he tells me it sounds like he had an anterior MI earlier this month (several days before his birthday) and presented late to [place]. Sounds like he had a cath and was treated medically. Now presents to ER with a suicide attempt ECG and troponin still abnormal consistent with previous MI but no chest pain. He has been taking his cardiac meds and denies HF or other cardiac symptoms. He has a multitude of other problems including suicidal depression and ongoing pain issues. He does not need inpatient cardiac care at this point. Will discuss with [facility] and ask them to assume care. Will check echo to have baseline. Records are pending. Will add carvedilol as tolerated. Stop Ranexa - can restart as needed.   Daniel Bensimhon,MD 7:35 AM  Addendum: With the patient's expressed permission, I have spoken to the cardiologists are [facility] Lan who confirm the above history regarding late presenting MI in early November. Cath with total LAD and faint collaterals. No other targets for revascularization.   Truman Hayward 7:36 AM

## 2014-08-06 NOTE — ED Notes (Signed)
Pt is alert, oriented and yelling at staff when his blood was drawn.

## 2014-08-07 DIAGNOSIS — F3130 Bipolar disorder, current episode depressed, mild or moderate severity, unspecified: Secondary | ICD-10-CM

## 2014-08-07 DIAGNOSIS — N390 Urinary tract infection, site not specified: Secondary | ICD-10-CM | POA: Diagnosis present

## 2014-08-07 LAB — TROPONIN I: Troponin I: 0.52 ng/mL (ref <0.30)

## 2014-08-07 MED ORDER — ISOSORBIDE MONONITRATE ER 30 MG PO TB24
30.0000 mg | ORAL_TABLET | Freq: Every day | ORAL | Status: DC
  Filled 2014-08-07: qty 1

## 2014-08-07 MED ORDER — NICOTINE 21 MG/24HR TD PT24
21.0000 mg | MEDICATED_PATCH | Freq: Every day | TRANSDERMAL | Status: DC
  Administered 2014-08-07 – 2014-08-08 (×3): 21 mg via TRANSDERMAL
  Filled 2014-08-07 (×5): qty 1

## 2014-08-07 MED ORDER — SODIUM CHLORIDE 0.9 % IV BOLUS (SEPSIS)
500.0000 mL | Freq: Once | INTRAVENOUS | Status: AC
  Administered 2014-08-07: 500 mL via INTRAVENOUS

## 2014-08-07 MED ORDER — CARVEDILOL 3.125 MG PO TABS
3.1250 mg | ORAL_TABLET | Freq: Two times a day (BID) | ORAL | Status: DC
  Administered 2014-08-07 – 2014-08-09 (×5): 3.125 mg via ORAL
  Filled 2014-08-07 (×6): qty 1

## 2014-08-07 NOTE — Plan of Care (Signed)
Problem: Phase I Progression Outcomes Goal: Pain controlled with appropriate interventions Outcome: Completed/Met Date Met:  08/07/14 Goal: OOB as tolerated unless otherwise ordered Outcome: Progressing  Problem: Phase III Progression Outcomes Goal: Foley discontinued Outcome: Not Applicable Date Met:  15/95/39 Goal: Discharge plan remains appropriate-arrangements made Outcome: Completed/Met Date Met:  08/07/14

## 2014-08-07 NOTE — Progress Notes (Signed)
CRITICAL VALUE ALERT  Critical value received: troponin 0.52  Date of notification: 08/07/2014  Time of notification: 1546  Critical value read back:yes  Nurse who received alert:  Eloy EndA. Alano Blasco RN  MD notified (1st page): GHERGHE  Time of first page: 1551  MD notified (2nd page):  Time of second page:  Responding MD:  Lafe GarinGHERGE
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
## 2014-08-07 NOTE — Clinical Social Work Note (Signed)
CSW met with patient to assess and determine needs as well as disposition. Patient reports that he has been staying at [facility] for about 1 week- he reports that this has not been a good setting for him due to his physical limitations- he has been to ALF in the past and would consider this again. CSW will proceed with dc planning and full assessment to follow.  Patient admits to feeling suicidal upon arrival- stating, "I felt hopeless". He also shares a hx of an overdose/suicide attempt about 1 month ago while in [facility] and was hospitalized. He denies feeling suicidal at this time, "I feel better since people are trying to help me". Support and encouragement provided. Will pursue dc options and advise.  Eduard Clos, MSW, [place]

## 2014-08-07 NOTE — Evaluation (Signed)
Physical Therapy Evaluation Patient Details Name: Jeffrey [name] MRN: [id number] DOB: [birth year] Today's Date: 08/07/2014   History of Present Illness  58 yo paraplegic who had a recent [facility] ([facility]), and pmh of hep C, and chronic pain from DDD.  [facility] to tentatively be admitted to the psych unit for suicidal ideations.  During his ER work up his troponin was noted to be elevated at 1.75 and he had EKG changes.  He [facility] by the Cardiology service.  Of note he was having significant difficulty at the homeless shelter where he lives due to poor hygiene.  Clinical Impression  Pt admitted with the above complications. Pt currently with functional limitations due to the deficits listed below (see PT Problem List). He reports a decline in his functional ability from baseline with tone resulting in significant Rt hamstring contracture. States he has had several falls at the homeless shelter and is in too much pain to care for himself resulting in poor hygiene. Pt will benefit from skilled PT to increase their independence and safety with mobility to allow discharge to the venue listed below.       Follow Up Recommendations SNF    Equipment Recommendations   (TBD)    Recommendations for Other Services OT consult     Precautions / Restrictions Precautions Precautions: Fall Restrictions Weight Bearing Restrictions: No      Mobility  Bed Mobility Overal bed mobility: Modified Independent             General bed mobility comments: Uses rail  Transfers Overall transfer level: Needs assistance Equipment used: Rolling walker (2 wheeled) Transfers: Sit to/from [employer] CorporationStand;Stand Pivot Transfers Sit to Stand: Min assist Stand pivot transfers: Min assist       General transfer comment: Min assist for boost to stand, x2 from lowest bed setting. VC for hand placement but pt disregards recommendation and pulls up from walker. Min assist for  walker placement with pivot transfer to his left side into chair. Cues to reach back for chair arm rests prior to sitting. Pt took very small steps while turning. Rt knee significantly flexed as well as trunk.  Ambulation/Gait                Stairs            Wheelchair Mobility    Modified Rankin (Stroke Patients Only)       Balance Overall balance assessment: Needs assistance;History of Falls Sitting-balance support: No upper extremity supported;Feet supported Sitting balance-Leahy Scale: Fair     Standing balance support: Bilateral upper extremity supported Standing balance-Leahy Scale: Poor                               Pertinent Vitals/Pain Pain Assessment: 0-10 Pain Score: 4  Pain Location: Rt knee, back and Lt hip Pain Descriptors / Indicators: Constant Pain Intervention(s): Limited activity within patient's tolerance;Monitored during session;Repositioned    Home Living Family/patient expects to be discharged to:: Unsure Living Arrangements: Other (Comment) (Homeless)   Type of Home: Homeless         Home Equipment: Wheelchair - power Additional Comments: Pt states he has been staying at a homeless shelter but they are asking him to leave due to his poor hygiene and inability to care for himself.    Prior Function Level of Independence: Independent with assistive device(s)  Comments: Power wheelchair for mobility. States he performs ADLs but has difficulty performing     Hand Dominance        Extremity/Trunk Assessment   Upper Extremity Assessment: Defer to OT evaluation           Lower Extremity Assessment: RLE deficits/detail;LLE deficits/detail RLE Deficits / Details: Significant tone in RLE with resulting contracture of hamstrings approximately 20-30 degrees from full extension. Tone causing external rotation of hips and pt essentailly remains in a position with RLE crossing over LLE. LLE Deficits / Details:  Well healed scar over Lt hip. Difficult to asses due to pain     Communication   Communication: No difficulties  Cognition Arousal/Alertness: Awake/alert Behavior During Therapy: WFL for tasks assessed/performed Overall Cognitive Status: Within Functional Limits for tasks assessed                      General Comments General comments (skin integrity, edema, etc.): Pt reports multiple falls at homeless shelter. States he has trouble caring for himself at homeless shelter. LEs positioned at end of therapy session to prevent further contractures. Educated on importance of stretching and positioning but pt states he cannot tolerate due to knee pain.    Exercises General Exercises - Lower Extremity Quad Sets: AROM;PROM;Right;10 reps;Seated      Assessment/Plan    PT Assessment Patient needs continued PT services  PT Diagnosis Difficulty walking;Acute pain;Other (comment) (paraplegia)   PT Problem List Decreased strength;Decreased range of motion;Decreased activity tolerance;Decreased balance;Decreased mobility;Decreased knowledge of use of DME;Decreased knowledge of precautions;Cardiopulmonary status limiting activity;Impaired tone;Pain  PT Treatment Interventions DME instruction;Gait training;Functional mobility training;Therapeutic activities;Therapeutic exercise;Balance training;Neuromuscular re-education;Patient/family education;Modalities;Manual techniques   PT Goals (Current goals can be found in the Care Plan section) Acute Rehab PT Goals Patient Stated Goal: Walk again PT Goal Formulation: With patient Time For Goal Achievement: 08/21/14 Potential to Achieve Goals: Fair    Frequency Min 3X/week   Barriers to discharge Inaccessible home environment;Decreased caregiver support Homeless    Co-evaluation               End of Session Equipment Utilized During Treatment: Gait belt Activity Tolerance: Patient limited by pain Patient left: in chair;with call  bell/phone within reach Nurse Communication: Mobility status;Other (comment) (IV beeping)         Time: 1710-1733 PT Time Calculation (min) (ACUTE ONLY): 23 min   Charges:   PT Evaluation $Initial PT Evaluation Tier I: 1 Procedure PT Treatments $Self Care/Home Management: 8-22   PT G Codes:          Berton MountBarbour, Zenas Santa S 08/07/2014, 6:22 PM  Sunday SpillersLogan Secor MalagaBarbour, [place]PT [phone]

## 2014-08-07 NOTE — Progress Notes (Signed)
PROGRESS NOTE  Brooke BonitoDean S Keeter WUJ:[id number] DOB: 14-Jan-1956 DOA: 08/05/2014 PCP: No PCP Per Patient  HPI: 58 yo paraplegic who had a recent hx of [facility] in 07/[facility] (at [facility]), and pmh of hep C, and chronic pain from DDD.  [facility] to tentatively be admitted to the psych unit for suicidal ideations.  During his ER work up his troponin was noted to be elevated at 1.75 and he had EKG changes.  [facility].  Of note he was having significant difficulty at the homeless shelter where he lives due to poor hygiene.  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 Per Cardiology he will receive medical management for his CAD. 2D echo completed 12/1 shows LVEF of 30-35% (details below) Patient on Coreg, lisinopril and imdur, but BP has been too low to receive it. Patient currently appears euvolemic.  Suicidal Ideations Cleared by psychiatry.  Will request psych social work to arrange for outpatient follow up. Continue zyprexa and cymbalta Patient shows signs of tardive dyskinesia (mouth chewing)  UTI UA appears positive.  Urine culture pending. Rocephin started on 12/1.  Wounds  Left hip with previous incision line scar which has re-opened in the middle; full thickness 1X.3X.1cm; 100% pink and dry. No odor or drainage. Left upper calf near knee with previous full thickness wound which has evolved into a scab; 1.8X.3X.1cm, black and dry without odor or drainage. Left achilles area above heel with previous full thickness wound which has evolved into a scab; 2X.3X.1cm, black and dry without odor or drainage. WOC Recommendation:  Bactroban to provide antimicrobial benefits and promote moist healing. Foam dressing to protect from further injury.  LE spastic paralysis  Chronic.  Stable.  Hep C Chronic.  Stable.  LFTs wnl.  Coags  wnl.  Tobacco Abuse Reports he uses liquid tobacco.  I explained that nicotine in any form is bad for his heart and arteries.  Encouraged cessation.  Chronic Pain From DDD.  Continued on percocet as he is at home.  Homelessness  Stays at the homeless shelter but is having difficulties there. Have requested help of Social work.  DVT Prophylaxis:  Lovenox   Code Status: full Family Communication: Patient is alert and orientated. Disposition Plan: inpatient.    Consultants:  Cardiology  Psychiatry  Procedures:  2D Echo Study Conclusions Left ventricle: The cavity size was normal. Wall thickness was increased in a pattern of mild LVH. Systolic function was moderately to severely reduced. The estimated ejection fraction was in the range of 30% to 35%. There is severe hypokinesis of the mid-apicalanteroseptal and apical myocardium. Doppler parameters are consistent with abnormal left ventricular relaxation (grade 1 diastolic dysfunction).  Antibiotics: Anti-infectives    Start     Dose/Rate Route Frequency Ordered Stop   08/06/14 0900  cefTRIAXone (ROCEPHIN) 1 g in dextrose 5 % 50 mL IVPB - Premix     1 g100 mL/hr over 30 Minutes Intravenous Every 24 hours 08/06/14 0815       HPI/Subjective: Patient with no complaints.  Denies chest pain.  Patient reports it is too much trouble to have a bowel movement - so he tries to have them infrequently (one every 10 days to 2 weeks).  It is too much trouble to get to the toilet and clean himself up.  Objective: Filed Vitals:  08/06/14 1817 08/07/14 0043 08/07/14 0542 08/07/14 0909  BP: 110/78 107/70 103/66 100/73  Pulse: 88 78 93   Temp: 98.1 F (36.7 C) 98.5 F (36.9 C) 98.1 F (36.7 C)   TempSrc: Oral Oral Oral   Resp: 20  19   Height:      Weight:      SpO2: 99% 100% 96%     Intake/Output Summary (Last 24 hours) at 08/07/14 1317 Last data filed at 08/07/14 0646  Gross per 24 hour  Intake    200 ml  Output   1600 ml   Net  -1400 ml   Filed Weights   08/06/14 0347  Weight: 119.401 kg (263 lb 3.7 oz)   Exam: General: overweight male, cooperative, NAD. HEENT:  PERR, EOMI, Anicteic Sclera, MMM. No pharyngeal erythema or exudates  Neck: Supple, no JVD, no masses  Cardiovascular: RRR, S1 S2 auscultated, no rubs, murmurs or gallops.   Respiratory: Clear to auscultation bilaterally with equal chest rise  Abdomen: obese, Soft, nontender, nondistended, + bowel sounds  Extremities: warm dry without cyanosis clubbing or edema.  Neuro: AAOx3, cranial nerves grossly intact. Strength 5/5 in upper and lower extremities  Skin: Without rashes exudates or nodules.   Psychiatric:  Pleasant, but with poor insight.  Data Reviewed: Basic Metabolic Panel:  Recent Labs Lab 08/05/14 2018  NA 144  K 3.7  CL 103  CO2 21  GLUCOSE 72  BUN 12  CREATININE 0.95  CALCIUM 9.5   Liver Function Tests:  Recent Labs Lab 08/05/14 2018  AST 16  ALT 7  ALKPHOS 83  BILITOT 0.3  PROT 7.6  ALBUMIN 3.5   CBC:  Recent Labs Lab 08/05/14 2018  WBC 13.0*  HGB 13.8  HCT 41.8  MCV 84.6  PLT 296   Cardiac Enzymes:  Recent Labs Lab 08/06/14 0223  TROPONINI 1.75*   Recent Results (from the past 240 hour(s))  MRSA PCR Screening     Status: None   Collection Time: 08/06/14  6:51 AM  Result Value Ref Range Status   MRSA by PCR NEGATIVE NEGATIVE Final    Comment:        The GeneXpert MRSA Assay (FDA approved for NASAL specimens only), is one component of a comprehensive MRSA colonization surveillance program. It is not intended to diagnose MRSA infection nor to guide or monitor treatment for MRSA infections.    Studies: Dg Chest 1 View  08/06/2014   CLINICAL DATA:  Shortness of breath and chest pain. Initial encounter.  EXAM: CHEST - 1 VIEW  COMPARISON:  03/15/2013  FINDINGS: The cardiomediastinal silhouette is unremarkable.  This is a low volume film.  There is no evidence of focal airspace disease,  pulmonary edema, suspicious pulmonary nodule/mass, pleural effusion, or pneumothorax. No acute bony abnormalities are identified.  IMPRESSION: No active disease.   Electronically Signed   By: Laveda AbbeJeff  Hu M.D.   On: 08/06/2014 01:29   Scheduled Meds: . aspirin EC  81 mg Oral Daily  . atorvastatin  80 mg Oral Daily  . carvedilol  3.125 mg Oral BID WC  . cefTRIAXone (ROCEPHIN)  IV  1 g Intravenous Q24H  . cyclobenzaprine  10 mg Oral TID  . DULoxetine  60 mg Oral Daily  . enoxaparin (LOVENOX) injection  40 mg Subcutaneous Q24H  . folic acid  1 mg Oral Daily  . [START ON 08/08/2014] isosorbide mononitrate  30 mg Oral Daily  . lisinopril  2.5 mg Oral Daily  .  LORazepam  0-4 mg Oral Q6H   Followed by  . [START ON 08/08/2014] LORazepam  0-4 mg Oral Q12H  . multivitamin with minerals  1 tablet Oral Daily  . mupirocin cream   Topical BID  . OLANZapine  10 mg Oral QHS  . thiamine  100 mg Oral Daily   Or  . thiamine  100 mg Intravenous Daily  . traZODone  100 mg Oral QHS   Continuous Infusions:   Principal Problem:   Elevated troponin Active Problems:   DEPRESSION/ANXIETY   HYPERTENSION, BENIGN ESSENTIAL   Low back pain   ETOH abuse   Bipolar disorder   Suicidal ideation   UTI (urinary tract infection)  Conley Canal  [facility] Pager [phone]. If 7PM-7AM, please contact night-coverage at [email or web address], password [facility] 08/07/2014, 1:17 PM  LOS: 2 days      Patient seen and examined, chart and data base reviewed.  I agree with the above assessment and plan.  For full details please see Mrs. Algis Downs PA note.  Pamella Pert, MD [facility] [phone]

## 2014-08-07 NOTE — Progress Notes (Signed)
Patient bolused 500 ml NS for low BP.  All BP meds held.  Algis DownsMarianne York, PA-C [facility] Pager: [phone]

## 2014-08-07 NOTE — Progress Notes (Signed)
   08/07/14 1300  Clinical Encounter Type  Visited With Patient  Visit Type Initial;Spiritual support;Social support  Referral From Nurse  Spiritual Encounters  Spiritual Needs Prayer;Emotional  Stress Factors  Patient Stress Factors Exhausted;Family relationships;Lack of caregivers   Chaplain was referred to patient via spiritual care consult. Consult indicated patient requested prayer. Chaplain visited with patient for roughly 20 min. The primary theme of our visit was the patient's experience of loneliness. Patient does not have much of a social support system. Patient's family wrote him a letter 5 years ago when he was having medical problems and told him good luck but that they could not see him because their lives were to busy. Patient also explained he has been in and out of homelessness and that his health condition has prevented him from meeting his current homeless shelter's standards. Patient also described feeling exhausted and at times explained that he wished God would take him and end his suffering. Patient specifically said, "everything I do these days is a chore". Patient did ask for prayer and chaplain prayed with patient. Patient did the praying and chaplain affirmed the patient's prayer. Chaplain will continue to provide emotional and spiritual support for patient as needed. Tieler Cournoyer, Tommi EmeryBlake R, Chaplain  1:59 PM

## 2014-08-07 NOTE — Progress Notes (Signed)
Pt asked for paper towel, when given paper towel pt then tore it in half and placed it in his mouth. When asked what he was doing pt stated, "It is my pacifier". Educated pt on risks of eating paper, will continue to monitor.   Jeffrey [name], RCharity fundraiser

## 2014-08-08 LAB — CBC
HCT: 40.7 % (ref 39.0–52.0)
Hemoglobin: 13.3 g/dL (ref 13.0–17.0)
MCH: 28.2 pg (ref 26.0–34.0)
MCHC: 32.7 g/dL (ref 30.0–36.0)
MCV: 86.2 fL (ref 78.0–100.0)
PLATELETS: 245 10*3/uL (ref 150–400)
RBC: 4.72 MIL/uL (ref 4.22–5.81)
RDW: 14.4 % (ref 11.5–15.5)
WBC: 7 10*3/uL (ref 4.0–10.5)

## 2014-08-08 NOTE — Evaluation (Signed)
Occupational Therapy Evaluation Patient Details Name: Jeffrey [name] MRN: [id number] DOB: 08/14/1956 Today's Date: 08/08/2014    History of Present Illness 58 yo paraplegic who had a recent h[facility] of [facility] in 07[facility] (at [facility]), and pmh of hep C, and chronic pain from DDD.  [facility] to tentatively be admitted to the psych unit for suicidal ideations.  During his ER work up his troponin was noted to be elevated at 1.75 and he had EKG changes.  [facility].  Of note he was having significant difficulty at the homeless shelter where he lives due to poor hygiene.   Clinical Impression   This 58 yo male admitted with above presents to acute OT with decreased balance due to spastic paraplegia thus affecting his ability to care for himself thoroughly. He will benefit from acute OT without need for follow up.    Follow Up Recommendations  No OT follow up          Precautions / Restrictions Precautions Precautions: Fall Restrictions Weight Bearing Restrictions: No      Mobility Bed Mobility Overal bed mobility: Modified Independent             General bed mobility comments: Uses rail  Transfers Overall transfer level: Needs assistance   Transfers: Squat Pivot Transfers     Squat pivot transfers: Min guard               ADL Overall ADL's : Needs assistance/impaired Eating/Feeding: Independent;Sitting   Grooming: Set up;Sitting   Upper Body Bathing: Set up;Sitting (on 3n1 in shower stall)   Lower Body Bathing: Minimal assistance;Sit to/from stand (in shower, sit<>stand from 3n1 using grab bars (needed A for feet and back peri area))   Upper Body Dressing : Set up;Sitting   Lower Body Dressing: Set up;Sitting/lateral leans;Bed level   Toilet Transfer: Min guard;Stand-pivot;BSC;Grab bars   Toileting- Clothing Manipulation and Hygiene: Minimal assistance;Sit to/from stand   Tub/ Shower Transfer: Minimal  assistance;Stand-pivot;3 in 1;Grab bars                     Pertinent Vitals/Pain Pain Assessment: No/denies pain     Hand Dominance Right   Extremity/Trunk Assessment Upper Extremity Assessment Upper Extremity Assessment: Overall WFL for tasks assessed           Communication Communication Communication: No difficulties   Cognition Arousal/Alertness: Awake/alert Behavior During Therapy: WFL for tasks assessed/performed Overall Cognitive Status: Within Functional Limits for tasks assessed                                Home Living Family/patient expects to be discharged to:: Unsure Living Arrangements:  (homeless)   Type of Home: Homeless                       Home Equipment: Wheelchair - power   Additional Comments: Pt states he has been staying at a homeless shelter but they are asking him to leave due to his poor hygiene and inability to care for himself.      Prior Functioning/Environment Level of Independence: Independent with assistive device(s)        Comments: Power wheelchair for mobility. States he performs ADLs but has difficulty performing. Roll in shower at homeless shelter    OT Diagnosis: Generalized weakness;Paresis   OT Problem List: Impaired balance (sitting and/or  standing);Decreased coordination;Impaired tone   OT Treatment/Interventions: Self-care/ADL training;Therapeutic activities;Patient/family education;Balance training;DME and/or AE instruction    OT Goals(Current goals can be found in the care plan section) Acute Rehab OT Goals Patient Stated Goal: be able to take care of myself better OT Goal Formulation: With patient  OT Frequency: Min 2X/week   Barriers to D/C: Decreased caregiver support             End of Session Nurse Communication:  (banadages need to be changed due to wet from shower)  Activity Tolerance: Patient tolerated treatment well Patient left: in bed;with call bell/phone within  reach   Time: [id number] OT Time Calculation (min): 46 min Charges:  OT General Charges $OT Visit: 1 Procedure OT Evaluation $Initial OT Evaluation Tier I: 1 Procedure OT Treatments $Self Care/Home Management : 38-52 mins  Evette GeorgesLeonard, Benard Minturn Eva [phone] 08/08/2014, 5:32 PM

## 2014-08-08 NOTE — Progress Notes (Signed)
PROGRESS NOTE  Jeffrey BonitoDean S [name] ZOX:[id number] DOB: 1956-06-26 DOA: 08/05/2014 PCP: No PCP Per Patient  HPI: 58 yo paraplegic who had a recent hx of [facility] in 07/2014 ([facility]), and pmh of hep C, and chronic pain from DDD.  He [facility] to tentatively be admitted to the psych unit for suicidal ideations.  During his ER work up his troponin was noted to be elevated at 1.75 and he had EKG changes.  [facility] by [facility] service.  Of note he was having significant difficulty at the homeless shelter where he lives due to poor hygiene.  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 - Per Cardiology he will receive medical management for his CAD. - 2D echo completed 12/1 shows LVEF of 30-35% (details below) - Patient on Coreg, lisinopril and imdur, but BP has been too low to receive it. - Patient currently appears euvolemic.  Suicidal Ideations - Cleared by psychiatry.  Will request psych social work to arrange for outpatient follow up. - Continue zyprexa and cymbalta - Patient shows signs of tardive dyskinesia (mouth chewing)  UTI - UA appears positive.  Urine culture pending. - Rocephin started on 12/1.  Wounds - Left hip with previous incision line scar which has re-opened in the middle; full thickness 1X.3X.1cm; 100% pink and dry. No odor or drainage. - Left upper calf near knee with previous full thickness wound which has evolved into a scab; 1.8X.3X.1cm, black and dry without odor or drainage. - Left achilles area above heel with previous full thickness wound which has evolved into a scab; 2X.3X.1cm, black and dry without odor or drainage. - WOC Recommendation:  Bactroban to provide antimicrobial benefits and promote moist healing. Foam dressing to protect from further injury.  LE spastic paralysis - Chronic. Stable.  Hep C - Chronic. Stable.  LFTs wnl. Coags wnl.  Tobacco Abuse - Encouraged cessation.   Chronic Pain From DDD. Continued on percocet as he is at home.  Homelessness  Stays at the homeless shelter but is having difficulties there. Have requested help of Social work.  DVT Prophylaxis:  Lovenox  Code Status: full Family Communication: Patient is alert and orientated. Disposition Plan: inpatient.   Consultants:  Cardiology  Psychiatry  Procedures:  2D Echo Study Conclusions Left ventricle: The cavity size was normal. Wall thickness was increased in a pattern of mild LVH. Systolic function was moderately to severely reduced. The estimated ejection fraction was in the range of 30% to 35%. There is severe hypokinesis of the mid-apicalanteroseptal and apical myocardium. Doppler parameters are consistent with abnormal left ventricular relaxation (grade 1 diastolic dysfunction).  Antibiotics: Anti-infectives    Start     Dose/Rate Route Frequency Ordered Stop   08/06/14 0900  cefTRIAXone (ROCEPHIN) 1 g in dextrose 5 % 50 mL IVPB - Premix     1 g100 mL/hr over 30 Minutes Intravenous Every 24 hours 08/06/14 0815       Objective: Filed Vitals:   08/08/14 0544 08/08/14 0812 08/08/14 1002 08/08/14 1200  BP: 106/86 112/77 109/68 113/80  Pulse: 79 86 78 82  Temp: 98.2 F (36.8 C)   98.5 F (36.9 C)  TempSrc:    Oral  Resp: 18   18  Height:      Weight:      SpO2: 97%   100%    Intake/Output Summary (  Last 24 hours) at 08/08/14 1548 Last data filed at 08/08/14 1419  Gross per 24 hour  Intake   1076 ml  Output   1775 ml  Net   -699 ml   Filed Weights   08/06/14 0347  Weight: 119.401 kg (263 lb 3.7 oz)   Exam: General: NAD. Neck: Supple, no JVD, no masses  Cardiovascular: RRR, S1 S2 auscultated, no rubs, murmurs or gallops.   Respiratory: Clear to auscultation bilaterally with equal chest rise  Abdomen: obese, Soft, nontender, nondistended, + bowel sounds  Extremities: warm dry without cyanosis  clubbing or edema.  Neuro: cranial nerves grossly intact. Strength 5/5 in upper and lower extremities  Psychiatric:  Pleasant, but with poor insight.  Data Reviewed: Basic Metabolic Panel:  Recent Labs Lab 08/05/14 2018  NA 144  K 3.7  CL 103  CO2 21  GLUCOSE 72  BUN 12  CREATININE 0.95  CALCIUM 9.5   Liver Function Tests:  Recent Labs Lab 08/05/14 2018  AST 16  ALT 7  ALKPHOS 83  BILITOT 0.3  PROT 7.6  ALBUMIN 3.5   CBC:  Recent Labs Lab 08/05/14 2018 08/08/14 0540  WBC 13.0* 7.0  HGB 13.8 13.3  HCT 41.8 40.7  MCV 84.6 86.2  PLT 296 245   Cardiac Enzymes:  Recent Labs Lab 08/06/14 0223 08/07/14 1503  TROPONINI 1.75* 0.52*   Recent Results (from the past 240 hour(s))  MRSA PCR Screening     Status: None   Collection Time: 08/06/14  6:51 AM  Result Value Ref Range Status   MRSA by PCR NEGATIVE NEGATIVE Final    Comment:        The GeneXpert MRSA Assay (FDA approved for NASAL specimens only), is one component of a comprehensive MRSA colonization surveillance program. It is not intended to diagnose MRSA infection nor to guide or monitor treatment for MRSA infections.   Culture, Urine     Status: None (Preliminary result)   Collection Time: 08/06/14  8:30 AM  Result Value Ref Range Status   Specimen Description URINE, RANDOM  Final   Special Requests NONE  Final   Culture  Setup Time   Final    08/07/2014 05:34 Performed at [facility]    Colony Count   Final    50,000 COLONIES/ML Performed at [facility]    Culture   Final    GRAM NEGATIVE RODS Performed at [facility]    Report Status PENDING  Incomplete   Studies: No results found. Scheduled Meds: . aspirin EC  81 mg Oral Daily  . atorvastatin  80 mg Oral Daily  . carvedilol  3.125 mg Oral BID WC  . cefTRIAXone (ROCEPHIN)  IV  1 g Intravenous Q24H  . cyclobenzaprine  10 mg Oral TID  . DULoxetine  60 mg Oral Daily  . enoxaparin (LOVENOX) injection   40 mg Subcutaneous Q24H  . folic acid  1 mg Oral Daily  . lisinopril  2.5 mg Oral Daily  . LORazepam  0-4 mg Oral Q12H  . multivitamin with minerals  1 tablet Oral Daily  . mupirocin cream   Topical BID  . nicotine  21 mg Transdermal QHS  . OLANZapine  10 mg Oral QHS  . thiamine  100 mg Oral Daily   Or  . thiamine  100 mg Intravenous Daily  . traZODone  100 mg Oral QHS   Continuous Infusions:   Principal Problem:   Elevated troponin Active Problems:  DEPRESSION/ANXIETY   HYPERTENSION, BENIGN ESSENTIAL   Low back pain   ETOH abuse   Bipolar disorder   Suicidal ideation   UTI (urinary tract infection)  Costin M. Elvera LennoxGherghe, MD [facility] [phone] If 7PM-7AM, please contact night-coverage at [email or web address], password [facility] 08/08/2014, 3:48 PM  LOS: 3 days

## 2014-08-08 NOTE — Clinical Social Work Note (Signed)
CSW continuing to work with area SNF's and ALF's to seek placement for patient. At this time, there has been no offers- will discuss further with team and update as progress is made.   Reece LevyJanet Jaryn Rosko, MSW, Theresia MajorsLCSWA [phone]

## 2014-08-08 NOTE — Plan of Care (Signed)
Problem: Consults Goal: General Medical Patient Education See Patient Education Module for specific education.  Outcome: Progressing Goal: Skin Care Protocol Initiated - if Braden Score 18 or less If consults are not indicated, leave blank or document N/A  Outcome: Completed/Met Date Met:  08/08/14  Problem: Phase I Progression Outcomes Goal: OOB as tolerated unless otherwise ordered Outcome: Not Applicable Date Met:  77/37/36 Pt is spastic paraplegic Goal: Initial discharge plan identified Outcome: Progressing Goal: Hemodynamically stable Outcome: Progressing  Problem: Phase II Progression Outcomes Goal: Progress activity as tolerated unless otherwise ordered Outcome: Progressing Goal: Discharge plan established Outcome: Completed/Met Date Met:  08/08/14 Goal: Vital signs remain stable Outcome: Progressing Goal: IV changed to normal saline lock Outcome: Progressing Goal: Other Phase II Outcomes/Goals Outcome: Progressing  Problem: Phase III Progression Outcomes Goal: Pain controlled on oral analgesia Outcome: Progressing Goal: Activity at appropriate level-compared to baseline (UP IN CHAIR FOR HEMODIALYSIS)  Outcome: Progressing Goal: Voiding independently Outcome: Completed/Met Date Met:  08/08/14 Goal: IV/normal saline lock discontinued Outcome: Progressing Goal: Other Phase III Outcomes/Goals Outcome: Progressing  Problem: Discharge Progression Outcomes Goal: Discharge plan in place and appropriate Outcome: Progressing Goal: Pain controlled with appropriate interventions Outcome: Progressing Goal: Hemodynamically stable Outcome: Progressing Goal: Complications resolved/controlled Outcome: Progressing Goal: Tolerating diet Outcome: Progressing Goal: Activity appropriate for discharge plan Outcome: Progressing Goal: Other Discharge Outcomes/Goals Outcome: Progressing
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
## 2014-08-08 NOTE — Plan of Care (Signed)
Problem: Phase I Progression Outcomes Goal: OOB as tolerated unless otherwise ordered Outcome: Progressing Goal: Initial discharge plan identified Outcome: Progressing Goal: Voiding-avoid urinary catheter unless indicated Outcome: Completed/Met Date Met:  08/08/14 Goal: Hemodynamically stable Outcome: Progressing Goal: Other Phase I Outcomes/Goals Outcome: Not Applicable Date Met:  20/80/22

## 2014-08-08 NOTE — Plan of Care (Signed)
Problem: Phase I Progression Outcomes Goal: Initial discharge plan identified Outcome: Progressing Goal: Hemodynamically stable Outcome: Progressing  Problem: Phase II Progression Outcomes Goal: Progress activity as tolerated unless otherwise ordered Outcome: Progressing Goal: Vital signs remain stable Outcome: Progressing Goal: IV changed to normal saline lock Outcome: Completed/Met Date Met:  08/08/14 Goal: Other Phase II Outcomes/Goals Outcome: Not Applicable Date Met:  72/25/75

## 2014-08-09 LAB — URINE CULTURE

## 2014-08-09 MED ORDER — CARVEDILOL 3.125 MG PO TABS: 3.1250 mg | ORAL_TABLET | Freq: Two times a day (BID) | ORAL | Status: DC

## 2014-08-09 MED ORDER — OXYCODONE-ACETAMINOPHEN 5-325 MG PO TABS: 1.0000 | ORAL_TABLET | ORAL | Status: DC | PRN

## 2014-08-09 MED ORDER — PIPERACILLIN-TAZOBACTAM 3.375 G IVPB 30 MIN
3.3750 g | Freq: Once | INTRAVENOUS | Status: AC
  Administered 2014-08-09: 3.375 g via INTRAVENOUS
  Filled 2014-08-09: qty 50

## 2014-08-09 MED ORDER — FENTANYL 25 MCG/HR TD PT72: 25.0000 ug | MEDICATED_PATCH | TRANSDERMAL | Status: DC

## 2014-08-09 MED ORDER — CIPROFLOXACIN HCL 500 MG PO TABS: 500.0000 mg | ORAL_TABLET | Freq: Two times a day (BID) | ORAL | Status: DC

## 2014-08-09 MED ORDER — ASPIRIN 81 MG PO TBEC
81.0000 mg | DELAYED_RELEASE_TABLET | Freq: Every day | ORAL | Status: AC
Start: 2014-08-09 — End: ?

## 2014-08-09 MED ORDER — DULOXETINE HCL 60 MG PO CPEP: 60.0000 mg | ORAL_CAPSULE | Freq: Every day | ORAL | Status: AC

## 2014-08-09 MED ORDER — RANOLAZINE ER 500 MG PO TB12: 500.0000 mg | ORAL_TABLET | Freq: Every day | ORAL | Status: DC

## 2014-08-09 MED ORDER — OLANZAPINE 10 MG PO TABS: 10.0000 mg | ORAL_TABLET | Freq: Every day | ORAL | Status: AC

## 2014-08-09 MED ORDER — NICOTINE 21 MG/24HR TD PT24
21.0000 mg | MEDICATED_PATCH | Freq: Every day | TRANSDERMAL | Status: DC
  Administered 2014-08-09: 21 mg via TRANSDERMAL
  Filled 2014-08-09: qty 1

## 2014-08-09 MED ORDER — CIPROFLOXACIN HCL 500 MG PO TABS
500.0000 mg | ORAL_TABLET | Freq: Two times a day (BID) | ORAL | Status: DC
  Administered 2014-08-09: 500 mg via ORAL
  Filled 2014-08-09 (×3): qty 1

## 2014-08-09 MED ORDER — ADULT MULTIVITAMIN W/MINERALS CH: 1.0000 | ORAL_TABLET | Freq: Every day | ORAL | Status: DC

## 2014-08-09 MED ORDER — ATORVASTATIN CALCIUM 80 MG PO TABS: 80.0000 mg | ORAL_TABLET | Freq: Every day | ORAL | Status: DC

## 2014-08-09 MED ORDER — CYCLOBENZAPRINE HCL 10 MG PO TABS: 10.0000 mg | ORAL_TABLET | Freq: Three times a day (TID) | ORAL | Status: DC

## 2014-08-09 MED ORDER — NICOTINE 21 MG/24HR TD PT24: 21.0000 mg | MEDICATED_PATCH | Freq: Every day | TRANSDERMAL | Status: DC

## 2014-08-09 MED ORDER — PIPERACILLIN-TAZOBACTAM 3.375 G IVPB
3.3750 g | Freq: Three times a day (TID) | INTRAVENOUS | Status: DC
  Filled 2014-08-09 (×2): qty 50

## 2014-08-09 NOTE — Progress Notes (Signed)
Physical Therapy Treatment Patient Details Name: Jeffrey BonitoDean S [name] MRN: [id number] DOB: 1956/08/31 Today's Date: 08/09/2014    History of Present Illness 58 yo paraplegic who had a [facility] and pmh of hep C, and chronic pain from DDD.  [facility] to tentatively be admitted to the psych unit for suicidal ideations.  During his ER work up his troponin was noted to be elevated at 1.75 and he had EKG changes.  [facility] service.  Of note he was having significant difficulty at the homeless shelter where he lives due to poor hygiene.    PT Comments    Pt progressing slowly and not likely to get the therapy he needs once d/c'd from hospital  Follow Up Recommendations  Other (comment) (no snf accepted pt, [facility]     [employer] Recommendations  Rolling walker with 5" wheels    Recommendations for Other Services       Precautions / Restrictions Precautions Precautions: Fall    Mobility  Bed Mobility Overal bed mobility: Modified Independent             General bed mobility comments: Uses rail  Transfers Overall transfer level: Needs assistance Equipment used: Rolling walker (2 wheeled) Transfers: Sit to/from Stand Sit to Stand: Min assist Stand pivot transfers: Min assist       General transfer comment: need boost to stand, more coming forward than lift.  Uses both hands on the RW.  Ambulation/Gait                 Stairs            Wheelchair Mobility    Modified Rankin (Stroke Patients Only)       Balance Overall balance assessment: Needs assistance   Sitting balance-Leahy Scale: Fair       Standing balance-Leahy Scale: Poor Standing balance comment: stood at EOB to pull up underwear and pants over 2 standing trials.                    Cognition Arousal/Alertness: Awake/alert Behavior During Therapy: WFL for tasks assessed/performed Overall  Cognitive Status: Within Functional Limits for tasks assessed                      Exercises      General Comments        Pertinent Vitals/Pain Pain Assessment: 0-10 Pain Score: 10-Worst pain ever Pain Location: knee hip and back Pain Descriptors / Indicators: Constant;Aching Pain Intervention(s): Limited activity within patient's tolerance    Home Living                      Prior Function            PT Goals (current goals can now be found in the care plan section) Acute Rehab PT Goals Patient Stated Goal: be able to take care of myself better Time For Goal Achievement: 08/21/14 Potential to Achieve Goals: Fair Progress towards PT goals: Progressing toward goals    Frequency  Min 3X/week    PT Plan Discharge plan needs to be updated    Co-evaluation             End of Session   Activity Tolerance: Patient limited by pain Patient left: Other (comment);with call bell/phone within reach (W/C)     Time: 1534-1600 PT Time  Calculation (min) (ACUTE ONLY): 26 min  Charges:  $Therapeutic Activity: 8-22 mins $Self Care/Home Management: 8-22                    G Codes:      Arnett Duddy, Eliseo GumKenneth V 08/09/2014, 4:55 PM 08/09/2014  [place] BingKen Thora Scherman, PT [phone]  (pager)
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
## 2014-08-09 NOTE — Clinical Social Work Note (Signed)
CSW has attempted to seek placement in SNF and ALF for patient throughout multiple surrounding counties to no avail. Patient is aware and will dc back to shelter. He has been set up with [facility] and is encouraged to follow up.  Patient will be transported back to shelter via EMS as his wheelchair is at shelter.  Reece LevyJanet Kenidee Cregan, MSW, Theresia MajorsLCSWA [phone]

## 2014-08-09 NOTE — Discharge Summary (Signed)
Physician Discharge Summary  Jeffrey [name] ZOX:[id number] DOB: Jul 12, 1956 DOA: 08/05/2014  PCP: No PCP Per Patient  Admit date: 08/05/2014 Discharge date: 08/09/2014  Time spent: 45 minutes  Recommendations for Outpatient Follow-up:  1. Follow up with [facility]. 2. Patient has mixed heart failure with recent MI.  On coreg and lisinopril 3. Evaluated by psychiatry.  No further SI.  Has been referred for outpatient follow up [facility]  Discharge Diagnoses:  Principal Problem:   Elevated troponin Active Problems:   DEPRESSION/ANXIETY   HYPERTENSION, BENIGN ESSENTIAL   Low back pain   ETOH abuse   Bipolar disorder   Suicidal ideation   UTI (urinary tract infection)  Discharge Condition: stable.  Diet recommendation: Heart Healthy  Filed Weights   08/06/14 0347  Weight: 119.401 kg (263 lb 3.7 oz)    History of present illness:  58 yo paraplegic who had a recent hx of MI in 07/2014 (at [facility]), and pmh of hep C, and chronic pain from DDD. He presented to [facility] ER to tentatively be admitted to the psych unit for suicidal ideations. During his ER work up his troponin was noted to be elevated at 1.75 and he had EKG changes. He was admitted to [facility] by the Cardiology service. Of note he was having significant difficulty at the homeless shelter where he living due to his poor hygiene.    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 -His trop[facility].  A follow up troponin showed a decrease to 0.52.  The patient had no complaints of chest pain.   -Per Dr. Prescott GumBensimhon's conversation with the cardiologist at [facility], the patient has a late presenting MI.  Cath showed total LAD and faint collaterals.  No other targets for revascularization.  - Per Cardiology he will receive medical management for his CAD. - 2D echo completed 12/1 shows LVEF of 30-35% (details below) - Patient was placed on Coreg, lisinopril and  imdur.  Unfortunately BP remained low so Imdur was discontinued. - Patient currently appears euvolemic.  He was prescribed coreg and lisinopril at d/c.  BP is stable on these two medications. - During this hospitalization patient was chest pain-free, and he will follow-up with cardiology as an outpatient.  Suicidal Ideations - Cleared by psychiatry. Appreciate psych social work arranging outpatient follow up. - Continue zyprexa and cymbalta - Per psychiatry's note from 12/4:  Patient has been compliant with his medication management and has no reported side effects. Patient has been lying down and sleeping without distress. Patient has been calm, cooperative and complaint with his current medication management. Patient has denied suicidal, homicidal ideation, intention or plan. Patient has no evidence of psychosis. Patient contract for safety and willing to follow up with outpatient psychiatric services when medically stable and discharged from the hospital. Patient has not exhibited irritability, agitation, and aggressive behavior.  UTI - UA appears positive. Urine culture showed pseudomonas sensitive to ciprofloxacin - Rocephin started on 12/1 and discontinued 12/3.  He received Zosyn on 12/4 and will be discharged on ciprofloxacin for 7 days.   Wounds - Left hip with previous incision line scar which has re-opened in the middle; full thickness 1X.3X.1cm; 100% pink and dry. No odor or drainage. - Left upper calf near knee with previous full thickness wound which has evolved into a scab; 1.8X.3X.1cm, black and dry without odor or drainage. - Left achilles area above heel with previous full  thickness wound which has evolved into a scab; 2X.3X.1cm, black and dry without odor or drainage. - WOC Recommendations included: Bactroban to provide antimicrobial benefits and promote moist healing. Foam dressing to protect from further injury.  These were followed while he was in the hospital.  LE  spastic paralysis - Chronic. Stable.  Hep C - Chronic. Stable. LFTs wnl. Coags wnl.  Tobacco Abuse - Encouraged cessation.  Chronic Pain From DDD. Continued on percocet and fentanyl patch as he is at home.   Homelessness  Social work has followed this patient and arranged housing following discharge   Procedures:  2D Echo Study Conclusions Left ventricle: The cavity size was normal. Wall thickness was increased in a pattern of mild LVH. Systolic function was moderately to severely reduced. The estimated ejection fraction was in the range of 30% to 35%. There is severe hypokinesis of the mid-apicalanteroseptal and apical myocardium. Doppler parameters are consistent with abnormal left ventricular relaxation (grade 1 diastolic dysfunction).  Consultations:  Psychiatry  Admitted by Cardiology  Discharge Exam: Filed Vitals:   08/09/14 0659 08/09/14 1020 08/09/14 1217 08/09/14 1403  BP: 94/65 154/85 116/86 121/93  Pulse: 73 84 90 78  Temp: 97.7 F (36.5 C)  97.6 F (36.4 C) 98.5 F (36.9 C)  TempSrc: Oral  Oral Oral  Resp: 18  16 18   Height:      Weight:      SpO2: 95%  96% 98%  General: NAD. Awake, Alert, Clear speech, lying comfortably in bed. Neck: Supple, no JVD, no masses  Cardiovascular: RRR, S1 S2 auscultated, no rubs, murmurs or gallops.  Respiratory: Clear to auscultation bilaterally with equal chest rise  Abdomen: obese, Soft, nontender, nondistended, + bowel sounds  Extremities: warm dry without cyanosis clubbing or edema. changes of venous stasis on LE. Psychiatric: Cooperative, appropriate.  At rest has continuous mouth chewing.   Discharge Instructions   Discharge Instructions    Diet - low sodium heart healthy    Complete by:  As directed      Increase activity slowly    Complete by:  As directed           Current Discharge Medication List    START taking these medications   Details  aspirin EC 81 MG EC tablet Take 1 tablet (81 mg  total) by mouth daily.    carvedilol (COREG) 3.125 MG tablet Take 1 tablet (3.125 mg total) by mouth 2 (two) times daily with a meal. Qty: 60 tablet, Refills: 6    ciprofloxacin (CIPRO) 500 MG tablet Take 1 tablet (500 mg total) by mouth 2 (two) times daily. Qty: 14 tablet, Refills: 0    Multiple Vitamin (MULTIVITAMIN WITH MINERALS) TABS tablet Take 1 tablet by mouth daily.    nicotine (NICODERM CQ - DOSED IN MG/24 HOURS) 21 mg/24hr patch Place 1 patch (21 mg total) onto the skin at bedtime. Qty: 28 patch, Refills: 0      CONTINUE these medications which have CHANGED   Details  atorvastatin (LIPITOR) 80 MG tablet Take 1 tablet (80 mg total) by mouth daily. Qty: 30 tablet, Refills: 3    cyclobenzaprine (FLEXERIL) 10 MG tablet Take 1 tablet (10 mg total) by mouth 3 (three) times daily. Qty: 30 tablet, Refills: 0    DULoxetine (CYMBALTA) 60 MG capsule Take 1 capsule (60 mg total) by mouth daily. For depression/pain control Qty: 30 capsule, Refills: 3    fentaNYL (DURAGESIC - DOSED MCG/HR) 25 MCG/HR patch Place 1 patch (25  mcg total) onto the skin every 3 (three) days. Qty: 5 patch, Refills: 0    OLANZapine (ZYPREXA) 10 MG tablet Take 1 tablet (10 mg total) by mouth at bedtime. Qty: 30 tablet, Refills: 3    oxyCODONE-acetaminophen (PERCOCET) 5-325 MG per tablet Take 1 tablet by mouth every 4 (four) hours as needed. Qty: 30 tablet, Refills: 0    ranolazine (RANEXA) 500 MG 12 hr tablet Take 1 tablet (500 mg total) by mouth daily. Qty: 30 tablet, Refills: 3      CONTINUE these medications which have NOT CHANGED   Details  lisinopril (PRINIVIL,ZESTRIL) 2.5 MG tablet Take 2.5 mg by mouth daily.    traZODone (DESYREL) 100 MG tablet Take 100 mg by mouth at bedtime.      STOP taking these medications     amLODipine (NORVASC) 5 MG tablet      isosorbide mononitrate (IMDUR) 30 MG 24 hr tablet        No Known Allergies Follow-up Information    Follow up with [facility]     On 08/13/2014.   Why:  9 am for hospital follow up   Contact information:   [address] [place] [address] [phone]      Follow up with [facility]. Call today.   Specialty:  Behavioral Health   Why:  follow up psychiatric assessment, medication managment, Outpatient psychiatric follow-up   Contact information:   [address] [place] [address] [phone]        The results of significant diagnostics from this hospitalization (including imaging, microbiology, ancillary and laboratory) are listed below for reference.    Significant Diagnostic Studies: Dg Chest 1 View  08/06/2014   CLINICAL DATA:  Shortness of breath and chest pain. Initial encounter.  EXAM: CHEST - 1 VIEW  COMPARISON:  03/15/2013  FINDINGS: The cardiomediastinal silhouette is unremarkable.  This is a low volume film.  There is no evidence of focal airspace disease, pulmonary edema, suspicious pulmonary nodule/mass, pleural effusion, or pneumothorax. No acute bony abnormalities are identified.  IMPRESSION: No active disease.   Electronically Signed   By: Laveda Abbe M.D.   On: 08/06/2014 01:29    Microbiology: Recent Results (from the past 240 hour(s))  MRSA PCR Screening     Status: None   Collection Time: 08/06/14  6:51 AM  Result Value Ref Range Status   MRSA by PCR NEGATIVE NEGATIVE Final    Comment:        The GeneXpert MRSA Assay (FDA approved for NASAL specimens only), is one component of a comprehensive MRSA colonization surveillance program. It is not intended to diagnose MRSA infection nor to guide or monitor treatment for MRSA infections.   Culture, Urine     Status: None   Collection Time: 08/06/14  8:30 AM  Result Value Ref Range Status   Specimen Description URINE, RANDOM  Final   Special Requests NONE  Final   Culture  Setup Time   Final    08/07/2014 05:34 Performed at [facility]    Colony Count   Final    50,000  COLONIES/ML Performed at [facility]    Culture   Final    PSEUDOMONAS AERUGINOSA Performed at [facility]    Report Status 08/09/2014 FINAL  Final   Organism ID, Bacteria PSEUDOMONAS AERUGINOSA  Final      Susceptibility   Pseudomonas aeruginosa - MIC*    CEFEPIME <=1 SENSITIVE  Sensitive     CEFTAZIDIME 2 SENSITIVE Sensitive     CIPROFLOXACIN <=0.25 SENSITIVE Sensitive     GENTAMICIN <=1 SENSITIVE Sensitive     IMIPENEM 1 SENSITIVE Sensitive     PIP/TAZO 8 SENSITIVE Sensitive     TOBRAMYCIN <=1 SENSITIVE Sensitive     * PSEUDOMONAS AERUGINOSA     Labs: Basic Metabolic Panel:  Recent Labs Lab 08/05/14 2018  NA 144  K 3.7  CL 103  CO2 21  GLUCOSE 72  BUN 12  CREATININE 0.95  CALCIUM 9.5   Liver Function Tests:  Recent Labs Lab 08/05/14 2018  AST 16  ALT 7  ALKPHOS 83  BILITOT 0.3  PROT 7.6  ALBUMIN 3.5   CBC:  Recent Labs Lab 08/05/14 2018 08/08/14 0540  WBC 13.0* 7.0  HGB 13.8 13.3  HCT 41.8 40.7  MCV 84.6 86.2  PLT 296 245   Cardiac Enzymes:  Recent Labs Lab 08/06/14 0223 08/07/14 1503  TROPONINI 1.75* 0.52*     Signed:  York, Marianne L, PA-C  [facility] 08/09/2014, 2:27 PM    Patient seen and examined, chart and data base reviewed.  I agree with the above assessment and plan and have edited the above note. Unfortunate 58 year old gentleman admitted with depression and suicidal ideation, as well as a UTI, status post recent MI prior to this hospitalization. Psychiatry was consulted, and felt like he does not need inpatient psychiatric hospitalization, he was found to have a urinary tract infection as she had Pseudomonas sensitive to ciprofloxacin for which she will be treated for 7 additional days following hospitalization. Patient did have positive troponins, trending down, cardiology saw patient on admission and felt like this was trailing following his MI, and recommended continue medical management.  For  full details please see Mrs. Algis DownsMarianne York PA note.  Pamella Pertostin Gherghe, MD [facility] [phone]

## 2014-08-09 NOTE — Clinical Social Work Psych Note (Signed)
Psychiatrist recommends outpatient MH follow-up for patient.  Patient is aware.  Patient has Medicaid insurance and currently receives medication management from [facility].  This information for after care/ follow-up have been included on the patient's discharge summary.    Vickii PennaGina Keltin Baird, LCSWA [phone]  Psychiatric & Orthopedics (5N 1-16) Clinical Social Worker

## 2014-08-09 NOTE — Progress Notes (Signed)
Patient's urine cultures positive for PSEUDOMONAS AERUGINOSA . Notified Schorr, NP. Orders placed.

## 2014-08-09 NOTE — Progress Notes (Signed)
Pt. Received discharge instructions and prescriptions. Educated pt. On follow-up appointments. Pt. removed IV. No skin issues noted. [place] [employer] service to pick up pt. And take to [facility]. Awaiting ride. All questions answered. No further needs noted at this time.

## 2014-08-09 NOTE — Plan of Care (Signed)
Problem: Spiritual Needs Goal: Ability to function at adequate level Outcome: Completed/Met Date Met:  08/09/14  Problem: Consults Goal: General Medical Patient Education See Patient Education Module for specific education.  Outcome: Completed/Met Date Met:  08/09/14  Problem: Phase I Progression Outcomes Goal: Initial discharge plan identified Outcome: Completed/Met Date Met:  08/09/14 Goal: Hemodynamically stable Outcome: Completed/Met Date Met:  08/09/14  Problem: Phase II Progression Outcomes Goal: Progress activity as tolerated unless otherwise ordered Outcome: Completed/Met Date Met:  08/09/14 Goal: Vital signs remain stable Outcome: Completed/Met Date Met:  08/09/14  Problem: Phase III Progression Outcomes Goal: Pain controlled on oral analgesia Outcome: Completed/Met Date Met:  08/09/14 Goal: Activity at appropriate level-compared to baseline (UP IN CHAIR FOR HEMODIALYSIS)  Outcome: Completed/Met Date Met:  08/09/14 Goal: IV/normal saline lock discontinued Outcome: Completed/Met Date Met:  08/09/14 Goal: Other Phase III Outcomes/Goals Outcome: Not Applicable Date Met:  23/20/09  Problem: Discharge Progression Outcomes Goal: Discharge plan in place and appropriate Outcome: Completed/Met Date Met:  08/09/14 Goal: Pain controlled with appropriate interventions Outcome: Completed/Met Date Met:  08/09/14 Goal: Hemodynamically stable Outcome: Completed/Met Date Met:  41/79/19 Goal: Complications resolved/controlled Outcome: Completed/Met Date Met:  08/09/14 Goal: Tolerating diet Outcome: Completed/Met Date Met:  08/09/14 Goal: Activity appropriate for discharge plan Outcome: Completed/Met Date Met:  08/09/14 Goal: Other Discharge Outcomes/Goals Outcome: Not Applicable Date Met:  95/79/00

## 2014-08-09 NOTE — Progress Notes (Signed)
58yo male had been on Rocephin 1g but now urine Cx positive for Pseudomonas, to change IV ABX.  Will start Zosyn 3.375g IV Q8H for CrCl >1500ml/min and monitor CBC and C/S.  Vernard GamblesVeronda Keyvon Herter, PharmD, BCPS 08/09/2014 1:32 AM

## 2014-08-09 NOTE — Discharge Instructions (Signed)
Please present to the [facility] at 9:00 on 12/8 for hospital follow up.  You were cared for by a hospitalist during your hospital stay. Once you are discharged, your primary care physician will handle any further medical issues. Please note that NO REFILLS for any discharge medications will be authorized once you are discharged, as it is imperative that you return to your primary care physician (or establish a relationship with a primary care physician if you do not have one) for your aftercare needs so that they can reassess your need for medications and monitor your lab values.

## 2014-08-13 ENCOUNTER — Encounter: Payer: Self-pay | Admitting: Internal Medicine

## 2014-08-13 ENCOUNTER — Ambulatory Visit: Payer: Medicaid Other | Attending: Internal Medicine | Admitting: Internal Medicine

## 2014-08-13 ENCOUNTER — Encounter (HOSPITAL_COMMUNITY): Payer: Self-pay | Admitting: Physical Medicine and Rehabilitation

## 2014-08-13 ENCOUNTER — Emergency Department (HOSPITAL_COMMUNITY)
Admission: EM | Admit: 2014-08-13 | Discharge: 2014-08-13 | Disposition: A | Discharge status: Home / Self Care | Payer: Medicaid Other | Attending: Emergency Medicine | Admitting: Emergency Medicine

## 2014-08-13 VITALS — BP 121/86 | HR 93 | Temp 98.4°F | Resp 18 | Ht 76.0 in | Wt 260.0 lb

## 2014-08-13 DIAGNOSIS — R531 Weakness: Secondary | ICD-10-CM | POA: Diagnosis not present

## 2014-08-13 DIAGNOSIS — M169 Osteoarthritis of hip, unspecified: Secondary | ICD-10-CM | POA: Diagnosis not present

## 2014-08-13 DIAGNOSIS — Z993 Dependence on wheelchair: Secondary | ICD-10-CM

## 2014-08-13 DIAGNOSIS — Z7982 Long term (current) use of aspirin: Secondary | ICD-10-CM | POA: Insufficient documentation

## 2014-08-13 DIAGNOSIS — I10 Essential (primary) hypertension: Secondary | ICD-10-CM | POA: Diagnosis not present

## 2014-08-13 DIAGNOSIS — I252 Old myocardial infarction: Secondary | ICD-10-CM | POA: Diagnosis not present

## 2014-08-13 DIAGNOSIS — I2510 Atherosclerotic heart disease of native coronary artery without angina pectoris: Secondary | ICD-10-CM | POA: Diagnosis not present

## 2014-08-13 DIAGNOSIS — B182 Chronic viral hepatitis C: Secondary | ICD-10-CM

## 2014-08-13 DIAGNOSIS — F419 Anxiety disorder, unspecified: Secondary | ICD-10-CM | POA: Insufficient documentation

## 2014-08-13 DIAGNOSIS — M179 Osteoarthritis of knee, unspecified: Secondary | ICD-10-CM | POA: Diagnosis not present

## 2014-08-13 DIAGNOSIS — E780 Pure hypercholesterolemia: Secondary | ICD-10-CM | POA: Insufficient documentation

## 2014-08-13 DIAGNOSIS — G8929 Other chronic pain: Secondary | ICD-10-CM | POA: Diagnosis not present

## 2014-08-13 DIAGNOSIS — Z8619 Personal history of other infectious and parasitic diseases: Secondary | ICD-10-CM | POA: Insufficient documentation

## 2014-08-13 DIAGNOSIS — R200 Anesthesia of skin: Secondary | ICD-10-CM | POA: Insufficient documentation

## 2014-08-13 DIAGNOSIS — G8220 Paraplegia, unspecified: Secondary | ICD-10-CM | POA: Diagnosis not present

## 2014-08-13 DIAGNOSIS — F322 Major depressive disorder, single episode, severe without psychotic features: Secondary | ICD-10-CM | POA: Diagnosis not present

## 2014-08-13 DIAGNOSIS — Z79899 Other long term (current) drug therapy: Secondary | ICD-10-CM | POA: Insufficient documentation

## 2014-08-13 DIAGNOSIS — G20 Parkinson's disease: Secondary | ICD-10-CM | POA: Diagnosis not present

## 2014-08-13 DIAGNOSIS — M545 Low back pain: Secondary | ICD-10-CM | POA: Insufficient documentation

## 2014-08-13 DIAGNOSIS — F329 Major depressive disorder, single episode, unspecified: Secondary | ICD-10-CM | POA: Diagnosis not present

## 2014-08-13 DIAGNOSIS — G894 Chronic pain syndrome: Secondary | ICD-10-CM | POA: Diagnosis not present

## 2014-08-13 MED ORDER — OXYCODONE HCL 5 MG PO TABS
10.0000 mg | ORAL_TABLET | Freq: Once | ORAL | Status: AC
  Administered 2014-08-13: 10 mg via ORAL
  Filled 2014-08-13: qty 2

## 2014-08-13 MED ORDER — ACETAMINOPHEN-CODEINE #3 300-30 MG PO TABS: 1.0000 | ORAL_TABLET | Freq: Three times a day (TID) | ORAL | Status: DC | PRN

## 2014-08-13 NOTE — ED Provider Notes (Signed)
CSN: [id number]     Arrival date & time 08/13/14  1255 History  This chart was scribed for non-physician practitioner, Jaynie Crumble, PA-C working with Arby Barrette, MD by [name], ED scribe. This patient was seen in room TR08C/TR08C and the patient's care was started at 1:32 PM.   Chief Complaint  Patient presents with  . Back Pain   HPI  HPI Comments: Jeffrey [name] is a 58 y.o. male who presents to the Emergency Department complaining of chronic lower back pain. Pt recently moved back to [place] from [facility] where he was being seen at [facility]. States he ran out of his percocet and fentanyl patches this morning and was told to go to [facility]. He went there this morning and and was seen but not given any of his refills. He later admits that he was given Tylenol No. 3's. He also states they will help him to get into pain management, but for now he has nowhere to get his medications. Patient states he is unable to go back to [facility] where his doctor is, because he has no transportation. Denies any new injuries.  Past Medical History  Diagnosis Date  . Necrotizing fasciitis   . Hypertension   . Chronic pain   . Parkinson disease   . Mental disorder   . Depression   . High cholesterol   . Myocardial infarction acute 07/16/2014  . Anginal pain   . Hypothyroidism   . Hepatitis C   . Headache     "weekly" (08/06/2014)  . DDD (degenerative disc disease)   . Degenerative disc disease   . Degenerative disc disease   . DDD (degenerative disc disease)   . DDD (degenerative disc disease)   . DDD (degenerative disc disease), lumbar   . Arthritis     "toes, knees, hips, back" (08/06/2014)  . Chronic lower back pain   . Anxiety    Past Surgical History  Procedure Laterality Date  . Hip surgery Left 04/2009    Necrotizing Fasciitis  . Appendectomy  ~ 2008  . Ankle surgery Left 1977    "tendon repair"  . Incision and drainage of wound N/A  11/26/2012    Procedure: IRRIGATION AND DEBRIDEMENT WOUND;  Surgeon: Emelia Loron, MD;  Location[facility] OR;  Service: General;  Laterality: N/A;  . Wound exploration N/A 11/26/2012    Procedure: WOUND EXPLORATION;  Surgeon: Emelia Loron, MD;  Location: [facility] OR;  Service: General;  Laterality: N/A;   History reviewed. No pertinent family history. History  Substance Use Topics  . Smoking status: Current Every Day Smoker -- 1.00 packs/day for 49 years    Types: Cigarettes  . Smokeless tobacco: Current User    Types: Chew  . Alcohol Use: 1.5 oz/week     Comment: 08/06/2014 "I'm in facilities where you can't drink"    Review of Systems  Constitutional: Negative for fever and chills.  Gastrointestinal: Negative for abdominal pain.  Genitourinary: Negative for dysuria and flank pain.  Musculoskeletal: Positive for myalgias, back pain and arthralgias.  Neurological: Positive for weakness and numbness.   Allergies  Review of patient's allergies indicates no known allergies.  Home Medications   Prior to Admission medications   Medication Sig Start Date End Date Taking? Authorizing Provider  acetaminophen-codeine (TYLENOL #3) 300-30 MG per tablet Take 1 tablet by mouth every 8 (eight) hours as needed. 08/13/14   Quentin Angst, MD  aspirin EC 81 MG EC tablet Take  1 tablet (81 mg total) by mouth daily. Patient not taking: Reported on 08/13/2014 08/09/14   Stephani PoliceMarianne L York, PA-C  atorvastatin (LIPITOR) 80 MG tablet Take 1 tablet (80 mg total) by mouth daily. Patient not taking: Reported on 08/13/2014 08/09/14   Stephani PoliceMarianne L York, PA-C  carvedilol (COREG) 3.125 MG tablet Take 1 tablet (3.125 mg total) by mouth 2 (two) times daily with a meal. Patient not taking: Reported on 08/13/2014 08/09/14   Stephani PoliceMarianne L York, PA-C  ciprofloxacin (CIPRO) 500 MG tablet Take 1 tablet (500 mg total) by mouth 2 (two) times daily. Patient not taking: Reported on 08/13/2014 08/09/14   Stephani PoliceMarianne L York, PA-C   cyclobenzaprine (FLEXERIL) 10 MG tablet Take 1 tablet (10 mg total) by mouth 3 (three) times daily. Patient not taking: Reported on 08/13/2014 08/09/14   Stephani PoliceMarianne L York, PA-C  DULoxetine (CYMBALTA) 60 MG capsule Take 1 capsule (60 mg total) by mouth daily. For depression/pain control 08/09/14   Stephani PoliceMarianne L York, PA-C  fentaNYL (DURAGESIC - DOSED MCG/HR) 25 MCG/HR patch Place 1 patch (25 mcg total) onto the skin every 3 (three) days. Patient not taking: Reported on 08/13/2014 08/09/14   Stephani PoliceMarianne L York, PA-C  lisinopril (PRINIVIL,ZESTRIL) 2.5 MG tablet Take 2.5 mg by mouth daily.    Historical Provider, MD  Multiple Vitamin (MULTIVITAMIN WITH MINERALS) TABS tablet Take 1 tablet by mouth daily. Patient not taking: Reported on 08/13/2014 08/09/14   Stephani PoliceMarianne L York, PA-C  nicotine (NICODERM CQ - DOSED IN MG/24 HOURS) 21 mg/24hr patch Place 1 patch (21 mg total) onto the skin at bedtime. Patient not taking: Reported on 08/13/2014 08/09/14   Tora KindredMarianne L York, PA-C  OLANZapine (ZYPREXA) 10 MG tablet Take 1 tablet (10 mg total) by mouth at bedtime. 08/09/14   Stephani PoliceMarianne L York, PA-C  oxyCODONE-acetaminophen (PERCOCET) 5-325 MG per tablet Take 1 tablet by mouth every 4 (four) hours as needed. 08/09/14   Tora KindredMarianne L York, PA-C  ranolazine (RANEXA) 500 MG 12 hr tablet Take 1 tablet (500 mg total) by mouth daily. 08/09/14   Tora KindredMarianne L York, PA-C  traZODone (DESYREL) 100 MG tablet Take 100 mg by mouth at bedtime.    Historical Provider, MD   BP 127/85 mmHg  Pulse 92  Temp(Src) 98.4 F (36.9 C) (Oral)  Resp 18  SpO2 98%   Physical Exam  Constitutional: He appears well-developed and well-nourished. No distress.  In wheel chair  HENT:  Head: Normocephalic.  Neck: Normal range of motion. Neck supple.  Cardiovascular: Normal rate, regular rhythm and normal heart sounds.   Pulmonary/Chest: Effort normal and breath sounds normal. No respiratory distress. He has no wheezes. He has no rales.  Abdominal: Soft. There is no  tenderness.  Skin: Skin is warm and dry.  Nursing note and vitals reviewed.   ED Course  Procedures (including critical care time)  DIAGNOSTIC STUDIES: Oxygen Saturation is 98% on RA, normal by my interpretation.    COORDINATION OF CARE: 1:37 PM-Discussed treatment plan which includes pain medication in the ED with pt at bedside and pt agreed to plan. Advised him that chronic pain medications can not be prescribed in the ED and that he will need to try to follow back up with [facility] until he can get a doctor here.   Labs Review Labs Reviewed - No data to display  Imaging Review No results found.   EKG Interpretation None      MDM   Final diagnoses:  Chronic pain  patient is here with chronic pain here for medication refill only. Just moved to [place], unable to fill his chronic pain medications, states nobody would prescribe them. [facility] at [facility] and refer to pain management. He was given Tylenol with Codeine there. He is not able to go back to [place] where he was last physician was because he has no transportation. He has no new complaints. I have ordered him oxycodone 10 mg by mouth in the emergency department, but will not be prescribing any of his pain medications. He is instructed to try taking a bus [place] [facility] or wait for his pain management referral.  Filed Vitals:   08/13/14 1306  BP: 127/85  Pulse: 92  Temp: 98.4 F (36.9 C)  TempSrc: Oral  Resp: 18  SpO2: 98%     I personally performed the services described in this documentation, which was scribed in my presence. The recorded information has been reviewed and is accurate.  Lottie Musselatyana A Demir T[place]worth, PA-C 08/13/14 1519  Arby BarretteMarcy Pfeiffer, MD 08/13/14 1650

## 2014-08-13 NOTE — ED Notes (Signed)
Pt refused discharge instructions,"I don't need that, you can just throw them away". Pt left per his personal W/C.

## 2014-08-13 NOTE — ED Notes (Signed)
Pt presents to department for evaluation of chronic back pain. Ran out of pain medication this morning. 10/10 pain at the time. Pt is alert and oriented x4. NAD.

## 2014-08-13 NOTE — Discharge Instructions (Signed)
His follow-up with pain management or your [facility].  Chronic Pain Chronic pain can be defined as pain that is off and on and lasts for 3-6 months or longer. Many things cause chronic pain, which can make it difficult to make a diagnosis. There are many treatment options available for chronic pain. However, finding a treatment that works well for you may require trying various approaches until the right one is found. Many people benefit from a combination of two or more types of treatment to control their pain. SYMPTOMS  Chronic pain can occur anywhere in the body and can range from mild to very severe. Some types of chronic pain include:  Headache.  Low back pain.  Cancer pain.  Arthritis pain.  Neurogenic pain. This is pain resulting from damage to nerves. People with chronic pain may also have other symptoms such as:  Depression.  Anger.  Insomnia.  Anxiety. DIAGNOSIS  Your health care provider will help diagnose your condition over time. In many cases, the initial focus will be on excluding possible conditions that could be causing the pain. Depending on your symptoms, your health care provider may order tests to diagnose your condition. Some of these tests may include:   Blood tests.   CT scan.   MRI.   X-rays.   Ultrasounds.   Nerve conduction studies.  You may need to see a specialist.  TREATMENT  Finding treatment that works well may take time. You may be referred to a pain specialist. He or she may prescribe medicine or therapies, such as:   Mindful meditation or yoga.  Shots (injections) of numbing or pain-relieving medicines into the spine or area of pain.  Local electrical stimulation.  Acupuncture.   Massage therapy.   Aroma, color, light, or sound therapy.   Biofeedback.   Working with a physical therapist to keep from getting stiff.   Regular, gentle exercise.   Cognitive or behavioral therapy.    Group support.  Sometimes, surgery may be recommended.  HOME CARE INSTRUCTIONS   Take all medicines as directed by your health care provider.   Lessen stress in your life by relaxing and doing things such as listening to calming music.   Exercise or be active as directed by your health care provider.   Eat a healthy diet and include things such as vegetables, fruits, fish, and lean meats in your diet.   Keep all follow-up appointments with your health care provider.   Attend a support group with others suffering from chronic pain. SEEK MEDICAL CARE IF:   Your pain gets worse.   You develop a new pain that was not there before.   You cannot tolerate medicines given to you by your health care provider.   You have new symptoms since your last visit with your health care provider.  SEEK IMMEDIATE MEDICAL CARE IF:   You feel weak.   You have decreased sensation or numbness.   You lose control of bowel or bladder function.   Your pain suddenly gets much worse.   You develop shaking.  You develop chills.  You develop confusion.  You develop chest pain.  You develop shortness of breath.  MAKE SURE YOU:  Understand these instructions.  Will watch your condition.  Will get help right away if you are not doing well or get worse. Document Released: 05/15/2002 Document Revised: 04/25/2013 Document Reviewed: 02/16/2013 [facility] Patient Information 2015 [place], [place]. This information is not intended to replace advice  given to you by your health care provider. Make sure you discuss any questions you have with your health care provider.

## 2014-08-13 NOTE — ED Notes (Signed)
PA at the bedside.

## 2014-08-13 NOTE — Patient Instructions (Signed)
Chronic Pain Chronic pain can be defined as pain that is off and on and lasts for 3-6 months or longer. Many things cause chronic pain, which can make it difficult to make a diagnosis. There are many treatment options available for chronic pain. However, finding a treatment that works well for you may require trying various approaches until the right one is found. Many people benefit from a combination of two or more types of treatment to control their pain. SYMPTOMS  Chronic pain can occur anywhere in the body and can range from mild to very severe. Some types of chronic pain include:  Headache.  Low back pain.  Cancer pain.  Arthritis pain.  Neurogenic pain. This is pain resulting from damage to nerves. People with chronic pain may also have other symptoms such as:  Depression.  Anger.  Insomnia.  Anxiety. DIAGNOSIS  Your health care provider will help diagnose your condition over time. In many cases, the initial focus will be on excluding possible conditions that could be causing the pain. Depending on your symptoms, your health care provider may order tests to diagnose your condition. Some of these tests may include:   Blood tests.   CT scan.   MRI.   X-rays.   Ultrasounds.   Nerve conduction studies.  You may need to see a specialist.  TREATMENT  Finding treatment that works well may take time. You may be referred to a pain specialist. He or she may prescribe medicine or therapies, such as:   Mindful meditation or yoga.  Shots (injections) of numbing or pain-relieving medicines into the spine or area of pain.  Local electrical stimulation.  Acupuncture.   Massage therapy.   Aroma, color, light, or sound therapy.   Biofeedback.   Working with a physical therapist to keep from getting stiff.   Regular, gentle exercise.   Cognitive or behavioral therapy.   Group support.  Sometimes, surgery may be recommended.  HOME CARE INSTRUCTIONS    Take all medicines as directed by your health care provider.   Lessen stress in your life by relaxing and doing things such as listening to calming music.   Exercise or be active as directed by your health care provider.   Eat a healthy diet and include things such as vegetables, fruits, fish, and lean meats in your diet.   Keep all follow-up appointments with your health care provider.   Attend a support group with others suffering from chronic pain. SEEK MEDICAL CARE IF:   Your pain gets worse.   You develop a new pain that was not there before.   You cannot tolerate medicines given to you by your health care provider.   You have new symptoms since your last visit with your health care provider.  SEEK IMMEDIATE MEDICAL CARE IF:   You feel weak.   You have decreased sensation or numbness.   You lose control of bowel or bladder function.   Your pain suddenly gets much worse.   You develop shaking.  You develop chills.  You develop confusion.  You develop chest pain.  You develop shortness of breath.  MAKE SURE YOU:  Understand these instructions.  Will watch your condition.  Will get help right away if you are not doing well or get worse. Document Released: 05/15/2002 Document Revised: 04/25/2013 Document Reviewed: 02/16/2013 [facility] Patient Information 2015 [place], [place]. This information is not intended to replace advice given to you by your health care provider. Make sure you discuss any  questions you have with your health care provider. Smoking Cessation Quitting smoking is important to your health and has many advantages. However, it is not always easy to quit since nicotine is a very addictive drug. Oftentimes, people try 3 times or more before being able to quit. This document explains the best ways for you to prepare to quit smoking. Quitting takes hard work and a lot of effort, but you can do it. ADVANTAGES OF QUITTING SMOKING  You  will live longer, feel better, and live better.  Your body will feel the impact of quitting smoking almost immediately.  Within 20 minutes, blood pressure decreases. Your pulse returns to its normal level.  After 8 hours, carbon monoxide levels in the blood return to normal. Your oxygen level increases.  After 24 hours, the chance of having a heart attack starts to decrease. Your breath, hair, and body stop smelling like smoke.  After 48 hours, damaged nerve endings begin to recover. Your sense of taste and smell improve.  After 72 hours, the body is virtually free of nicotine. Your bronchial tubes relax and breathing becomes easier.  After 2 to 12 weeks, lungs can hold more air. Exercise becomes easier and circulation improves.  The risk of having a heart attack, stroke, cancer, or lung disease is greatly reduced.  After 1 year, the risk of coronary heart disease is cut in half.  After 5 years, the risk of stroke falls to the same as a nonsmoker.  After 10 years, the risk of lung cancer is cut in half and the risk of other cancers decreases significantly.  After 15 years, the risk of coronary heart disease drops, usually to the level of a nonsmoker.  If you are pregnant, quitting smoking will improve your chances of having a healthy baby.  The people you live with, especially any children, will be healthier.  You will have extra money to spend on things other than cigarettes. QUESTIONS TO THINK ABOUT BEFORE ATTEMPTING TO QUIT You may want to talk about your answers with your health care provider.  Why do you want to quit?  If you tried to quit in the past, what helped and what did not?  What will be the most difficult situations for you after you quit? How will you plan to handle them?  Who can help you through the tough times? Your family? Friends? A health care provider?  What pleasures do you get from smoking? What ways can you still get pleasure if you quit? Here are  some questions to ask your health care provider:  How can you help me to be successful at quitting?  What medicine do you think would be best for me and how should I take it?  What should I do if I need more help?  What is smoking withdrawal like? How can I get information on withdrawal? GET READY  Set a quit date.  Change your environment by getting rid of all cigarettes, ashtrays, matches, and lighters in your home, car, or work. Do not let people smoke in your home.  Review your past attempts to quit. Think about what worked and what did not. GET SUPPORT AND ENCOURAGEMENT You have a better chance of being successful if you have help. You can get support in many ways.  Tell your family, friends, and coworkers that you are going to quit and need their support. Ask them not to smoke around you.  Get individual, group, or telephone counseling and support. Programs  are available at local hospitals and health centers. Call your local health department for information about programs in your area.  Spiritual beliefs and practices may help some smokers quit.  Download a "quit meter" on your computer to keep track of quit statistics, such as how long you have gone without smoking, cigarettes not smoked, and money saved.  Get a self-help book about quitting smoking and staying off tobacco. LEARN NEW SKILLS AND BEHAVIORS  Distract yourself from urges to smoke. Talk to someone, go for a walk, or occupy your time with a task.  Change your normal routine. Take a different route to work. Drink tea instead of coffee. Eat breakfast in a different place.  Reduce your stress. Take a hot bath, exercise, or read a book.  Plan something enjoyable to do every day. Reward yourself for not smoking.  Explore interactive web-based programs that specialize in helping you quit. GET MEDICINE AND USE IT CORRECTLY Medicines can help you stop smoking and decrease the urge to smoke. Combining medicine with  the above behavioral methods and support can greatly increase your chances of successfully quitting smoking.  Nicotine replacement therapy helps deliver nicotine to your body without the negative effects and risks of smoking. Nicotine replacement therapy includes nicotine gum, lozenges, inhalers, nasal sprays, and skin patches. Some may be available over-the-counter and others require a prescription.  Antidepressant medicine helps people abstain from smoking, but how this works is unknown. This medicine is available by prescription.  Nicotinic receptor partial agonist medicine simulates the effect of nicotine in your brain. This medicine is available by prescription. Ask your health care provider for advice about which medicines to use and how to use them based on your health history. Your health care provider will tell you what side effects to look out for if you choose to be on a medicine or therapy. Carefully read the information on the package. Do not use any other product containing nicotine while using a nicotine replacement product.  RELAPSE OR DIFFICULT SITUATIONS Most relapses occur within the first 3 months after quitting. Do not be discouraged if you start smoking again. Remember, most people try several times before finally quitting. You may have symptoms of withdrawal because your body is used to nicotine. You may crave cigarettes, be irritable, feel very hungry, cough often, get headaches, or have difficulty concentrating. The withdrawal symptoms are only temporary. They are strongest when you first quit, but they will go away within 10-14 days. To reduce the chances of relapse, try to:  Avoid drinking alcohol. Drinking lowers your chances of successfully quitting.  Reduce the amount of caffeine you consume. Once you quit smoking, the amount of caffeine in your body increases and can give you symptoms, such as a rapid heartbeat, sweating, and anxiety.  Avoid smokers because they can  make you want to smoke.  Do not let weight gain distract you. Many smokers will gain weight when they quit, usually less than 10 pounds. Eat a healthy diet and stay active. You can always lose the weight gained after you quit.  Find ways to improve your mood other than smoking. FOR MORE INFORMATION  www.smokefree.gov  Document Released: 08/17/2001 Document Revised: 01/07/2014 Document Reviewed: 12/02/2011 [facility] Patient Information 2015 [place], [place]. This information is not intended to replace advice given to you by your health care provider. Make sure you discuss any questions you have with your health care provider.

## 2014-08-13 NOTE — Progress Notes (Signed)
Patient ID: YUSSUF SAWYERS, male   DOB: 05-29-1956, 58 y.o.   MRN: [id number]   Kavari Parrillo, is a 58 y.o. male  [id number]  [id number]  DOB - 01-20-1956  CC:  Chief Complaint  Patient presents with  . Hospitalization Follow-up  . Chest Pain  . Establish Care  . Back Pain       HPI: Abhay Godbolt is a 58 y.o. male here today to establish medical care. Gaylyn Rong has complex medical history significant for CAD, chronic pain, hypertension, hepatitis C, paraplegia was recently admitted to the [facility] with symptoms of suicidal ideation. Patient reportedly endorses symptoms of suicidal ideation after staff at the homeless shelter was going to take him out because of poor hygiene. Patient reportedly has difficult time with ambulation and is unable to care for himself with ADLs especially with urination and reportedly often soils himself. Therefore patient states that he often smells of urine. Patient has become depressed due to complaints of poor hygiene and possibly not being able to stay at the homeless shelter. Therefore he was figured about overdosing on the pills. He was evaluated by geropsychiatric at [facility] who recommended inpatient admission and workup. As a part of workup, patient had a troponin checked which returned elevated at 1.75. An EKG subsequently showed Q waves in the anterior leads with T-wave inversions. Patient endorses chronic pain but denies any chest pain, chest pressure. He states that he has severe chronic low back and leg pain. In the ED, patient was noted to be chewing his fentanyl patch. Patient had recent MI in November 2015. Patient was admitted and managed appropriately and was discharged to be followed up in our clinic today. Patient currently lives in [place], denies any symptoms today. Declines flu vaccination. Patient is yet to feel his prescriptions from the hospital. Patient is currently on antidepressants and has follow-up with  psychiatrist. He continues to smoke heavily and not ready to quit. He needs a referral to neurologist.  No Known Allergies Past Medical History  Diagnosis Date  . Necrotizing fasciitis   . Hypertension   . Chronic pain   . Parkinson disease   . Mental disorder   . Depression   . High cholesterol   . Myocardial infarction acute 07/16/2014  . Anginal pain   . Hypothyroidism   . Hepatitis C   . Headache     "weekly" (08/06/2014)  . DDD (degenerative disc disease)   . Degenerative disc disease   . Degenerative disc disease   . DDD (degenerative disc disease)   . DDD (degenerative disc disease)   . DDD (degenerative disc disease), lumbar   . Arthritis     "toes, knees, hips, back" (08/06/2014)  . Chronic lower back pain   . Anxiety    Current Outpatient Prescriptions on File Prior to Visit  Medication Sig Dispense Refill  . DULoxetine (CYMBALTA) 60 MG capsule Take 1 capsule (60 mg total) by mouth daily. For depression/pain control 30 capsule 3  . OLANZapine (ZYPREXA) 10 MG tablet Take 1 tablet (10 mg total) by mouth at bedtime. 30 tablet 3  . oxyCODONE-acetaminophen (PERCOCET) 5-325 MG per tablet Take 1 tablet by mouth every 4 (four) hours as needed. 30 tablet 0  . ranolazine (RANEXA) 500 MG 12 hr tablet Take 1 tablet (500 mg total) by mouth daily. 30 tablet 3  . traZODone (DESYREL) 100 MG tablet Take 100 mg by mouth at bedtime.    Marland Kitchen aspirin EC 81 MG  EC tablet Take 1 tablet (81 mg total) by mouth daily. (Patient not taking: Reported on 08/13/2014)    . atorvastatin (LIPITOR) 80 MG tablet Take 1 tablet (80 mg total) by mouth daily. (Patient not taking: Reported on 08/13/2014) 30 tablet 3  . carvedilol (COREG) 3.125 MG tablet Take 1 tablet (3.125 mg total) by mouth 2 (two) times daily with a meal. (Patient not taking: Reported on 08/13/2014) 60 tablet 6  . ciprofloxacin (CIPRO) 500 MG tablet Take 1 tablet (500 mg total) by mouth 2 (two) times daily. (Patient not taking: Reported on  08/13/2014) 14 tablet 0  . cyclobenzaprine (FLEXERIL) 10 MG tablet Take 1 tablet (10 mg total) by mouth 3 (three) times daily. (Patient not taking: Reported on 08/13/2014) 30 tablet 0  . fentaNYL (DURAGESIC - DOSED MCG/HR) 25 MCG/HR patch Place 1 patch (25 mcg total) onto the skin every 3 (three) days. (Patient not taking: Reported on 08/13/2014) 5 patch 0  . lisinopril (PRINIVIL,ZESTRIL) 2.5 MG tablet Take 2.5 mg by mouth daily.    . Multiple Vitamin (MULTIVITAMIN WITH MINERALS) TABS tablet Take 1 tablet by mouth daily. (Patient not taking: Reported on 08/13/2014)    . nicotine (NICODERM CQ - DOSED IN MG/24 HOURS) 21 mg/24hr patch Place 1 patch (21 mg total) onto the skin at bedtime. (Patient not taking: Reported on 08/13/2014) 28 patch 0  . [DISCONTINUED] potassium chloride SA (K-DUR,KLOR-CON) 20 MEQ tablet Take 1 tablet (20 mEq total) by mouth 2 (two) times daily. For low potassium (Patient not taking: Reported on 08/04/2014) 60 tablet 0  . [DISCONTINUED] sildenafil (VIAGRA) 25 MG tablet Take 1 tablet (25 mg total) by mouth daily as needed for erectile dysfunction. 10 tablet 0   No current facility-administered medications on file prior to visit.   History reviewed. No pertinent family history. History   Social History  . Marital Status: Single    Spouse Name: N/A    Number of Children: N/A  . Years of Education: N/A   Occupational History  . Not on file.   Social History Main Topics  . Smoking status: Current Every Day Smoker -- 1.00 packs/day for 49 years    Types: Cigarettes  . Smokeless tobacco: Current User    Types: Chew  . Alcohol Use: 1.5 oz/week     Comment: 08/06/2014 "I'm in facilities where you can't drink"  . Drug Use: Yes    Special: Marijuana, Cocaine, Other-see comments     Comment: 08/06/2014 "never was a big user of any of it; right place at wrong time I'll use; I don't seek drugs"  . Sexual Activity:    Partners: Female   Other Topics Concern  . Not on file    Social History Narrative   ** Merged History Encounter **        Review of Systems: Constitutional: Negative for fever, chills, diaphoresis, activity change, appetite change and fatigue. HENT: Negative for ear pain, nosebleeds, congestion, facial swelling, rhinorrhea, neck pain, neck stiffness and ear discharge.  Eyes: Negative for pain, discharge, redness, itching and visual disturbance. Respiratory: Negative for cough, choking, chest tightness, shortness of breath, wheezing and stridor.  Cardiovascular: Negative for chest pain, palpitations and leg swelling. Gastrointestinal: Negative for abdominal distention. Genitourinary: Negative for dysuria, urgency, frequency, hematuria, flank pain, decreased urine volume, difficulty urinating and dyspareunia. . Neurological: Negative for dizziness, tremors, seizures, syncope, facial asymmetry, speech difficulty Hematological: Negative for adenopathy. Does not bruise/bleed easily. Psychiatric/Behavioral: Negative for hallucinations, behavioral problems, confusion, dysphoric mood,  decreased concentration and agitation.    Objective:   Filed Vitals:   08/13/14 1021  BP: 121/86  Pulse: 93  Temp: 98.4 F (36.9 C)  Resp: 18    Physical Exam: Constitutional: Patient appears well-developed and well-nourished. No distress. Wheelchair bound HENT: Normocephalic, atraumatic, External right and left ear normal. Oropharynx is clear and moist.  Eyes: Conjunctivae and EOM are normal. PERRLA, no scleral icterus. Neck: Normal ROM. Neck supple. No JVD. No tracheal deviation. No thyromegaly. CVS: RRR, S1/S2 +, no murmurs, no gallops, no carotid bruit.  Pulmonary: Effort and breath sounds normal, no stridor, rhonchi, wheezes, rales.  Abdominal: Soft. BS +, no distension, tenderness, rebound or guarding.  Musculoskeletal: Paraplegia on wheelchair  Lymphadenopathy: No lymphadenopathy noted, cervical, inguinal or axillary Neuro: Alert. Normal reflexes,  muscle tone coordination. No cranial nerve deficit. Skin: Skin is warm and dry. No rash noted. Not diaphoretic. No erythema. No pallor. Psychiatric: Normal mood and affect. Behavior, judgment, thought content normal.  Lab Results  Component Value Date   WBC 7.0 08/08/2014   HGB 13.3 08/08/2014   HCT 40.7 08/08/2014   MCV 86.2 08/08/2014   PLT 245 08/08/2014   Lab Results  Component Value Date   CREATININE 0.95 08/05/2014   BUN 12 08/05/2014   NA 144 08/05/2014   K 3.7 08/05/2014   CL 103 08/05/2014   CO2 21 08/05/2014    Lab Results  Component Value Date   HGBA1C 4.6 04/24/2008   Lipid Panel     Component Value Date/Time   CHOL 144 04/02/2010 1412   TRIG 144 04/02/2010 1412   HDL 31 04/02/2010 1412   VLDL 29 04/02/2010 1412   LDLCALC 84 04/02/2010 1412       Assessment and plan:   1. CAD in native artery Follow-up with cardiologist as scheduled  2. Hep C w/o coma, chronic Stable  3. Chronic pain syndrome  - Ambulatory referral to Pain Clinic  4. Wheelchair bound  Licensed clinical social worker helping patient with homeless situation  5. Paraplegia  - Ambulatory referral to Neurology  6. Major depression, chronic  - Ambulatory referral to Psychiatry   Return in about 6 months (around 02/12/2015) for Follow up Pain and comorbidities, Follow up HTN.  The patient was given clear instructions to go to ER or return to medical center if symptoms don't improve, worsen or new problems develop. The patient verbalized understanding. The patient was told to call to get lab results if they haven't heard anything in the next week.     This note has been created with Education officer, environmentalDragon speech recognition software and smart phrase technology. Any transcriptional errors are unintentional.    Jeanann LewandowskyJEGEDE, Aydan Phoenix, MD, MHA, FACP, FAAP [facility] [place], [place] [phone]   08/13/2014, 11:11 AM

## 2014-08-13 NOTE — ED Notes (Signed)
Here for refills on pain meds. [facility] but they would not write for his meds. Pt states he usually takes 10mg  Oxycodone, 10 Oxycontin and a Fentanyl patch. Most recently had meds prescribed by [facility] in [place]. Pt states he has now moved back to [place].

## 2014-08-13 NOTE — Progress Notes (Signed)
Pt here to establish care for multiple medical problems HFU- chest pain,[facility] 08/09/14 [facility] PMHx- Paraplegic with recent MI 07/16/2014 Pt denies pain at this time Wheelchair bound Declines flu vaccine Pt has paper scripts with him that need filled Heavy smoker- has script for patches

## 2014-08-15 ENCOUNTER — Emergency Department (HOSPITAL_COMMUNITY)
Admission: EM | Admit: 2014-08-15 | Discharge: 2014-08-15 | Disposition: A | Discharge status: Home / Self Care | Payer: Medicaid Other | Attending: Emergency Medicine | Admitting: Emergency Medicine

## 2014-08-15 ENCOUNTER — Encounter (HOSPITAL_COMMUNITY): Payer: Self-pay | Admitting: Emergency Medicine

## 2014-08-15 DIAGNOSIS — I209 Angina pectoris, unspecified: Secondary | ICD-10-CM | POA: Diagnosis not present

## 2014-08-15 DIAGNOSIS — Z720 Tobacco use: Secondary | ICD-10-CM | POA: Diagnosis not present

## 2014-08-15 DIAGNOSIS — E039 Hypothyroidism, unspecified: Secondary | ICD-10-CM | POA: Insufficient documentation

## 2014-08-15 DIAGNOSIS — Y9389 Activity, other specified: Secondary | ICD-10-CM | POA: Insufficient documentation

## 2014-08-15 DIAGNOSIS — M159 Polyosteoarthritis, unspecified: Secondary | ICD-10-CM | POA: Diagnosis not present

## 2014-08-15 DIAGNOSIS — E780 Pure hypercholesterolemia: Secondary | ICD-10-CM | POA: Insufficient documentation

## 2014-08-15 DIAGNOSIS — Z8619 Personal history of other infectious and parasitic diseases: Secondary | ICD-10-CM | POA: Diagnosis not present

## 2014-08-15 DIAGNOSIS — Z7982 Long term (current) use of aspirin: Secondary | ICD-10-CM | POA: Insufficient documentation

## 2014-08-15 DIAGNOSIS — M62830 Muscle spasm of back: Secondary | ICD-10-CM | POA: Insufficient documentation

## 2014-08-15 DIAGNOSIS — G8929 Other chronic pain: Secondary | ICD-10-CM | POA: Insufficient documentation

## 2014-08-15 DIAGNOSIS — Z792 Long term (current) use of antibiotics: Secondary | ICD-10-CM | POA: Insufficient documentation

## 2014-08-15 DIAGNOSIS — Y9289 Other specified places as the place of occurrence of the external cause: Secondary | ICD-10-CM | POA: Diagnosis not present

## 2014-08-15 DIAGNOSIS — I10 Essential (primary) hypertension: Secondary | ICD-10-CM | POA: Diagnosis not present

## 2014-08-15 DIAGNOSIS — Z79899 Other long term (current) drug therapy: Secondary | ICD-10-CM | POA: Diagnosis not present

## 2014-08-15 DIAGNOSIS — Y998 Other external cause status: Secondary | ICD-10-CM | POA: Diagnosis not present

## 2014-08-15 DIAGNOSIS — S3992XA Unspecified injury of lower back, initial encounter: Secondary | ICD-10-CM | POA: Diagnosis present

## 2014-08-15 DIAGNOSIS — I252 Old myocardial infarction: Secondary | ICD-10-CM | POA: Insufficient documentation

## 2014-08-15 DIAGNOSIS — X58XXXA Exposure to other specified factors, initial encounter: Secondary | ICD-10-CM | POA: Diagnosis not present

## 2014-08-15 DIAGNOSIS — G20 Parkinson's disease: Secondary | ICD-10-CM | POA: Diagnosis not present

## 2014-08-15 DIAGNOSIS — Z765 Malingerer [conscious simulation]: Secondary | ICD-10-CM | POA: Diagnosis not present

## 2014-08-15 DIAGNOSIS — F329 Major depressive disorder, single episode, unspecified: Secondary | ICD-10-CM | POA: Diagnosis not present

## 2014-08-15 LAB — URINALYSIS, ROUTINE W REFLEX MICROSCOPIC
Bilirubin Urine: NEGATIVE
Glucose, UA: NEGATIVE mg/dL
Ketones, ur: NEGATIVE mg/dL
Leukocytes, UA: NEGATIVE
Nitrite: NEGATIVE
Protein, ur: NEGATIVE mg/dL
Specific Gravity, Urine: 1.011 (ref 1.005–1.030)
Urobilinogen, UA: 0.2 mg/dL (ref 0.0–1.0)
pH: 6.5 (ref 5.0–8.0)

## 2014-08-15 LAB — URINE MICROSCOPIC-ADD ON

## 2014-08-15 MED ORDER — CYCLOBENZAPRINE HCL 10 MG PO TABS
10.0000 mg | ORAL_TABLET | Freq: Once | ORAL | Status: AC
  Administered 2014-08-15: 10 mg via ORAL
  Filled 2014-08-15: qty 1

## 2014-08-15 MED ORDER — HYDROCODONE-ACETAMINOPHEN 5-325 MG PO TABS
2.0000 | ORAL_TABLET | Freq: Once | ORAL | Status: AC
Start: 2014-08-15 — End: 2014-08-15
  Administered 2014-08-15: 2 via ORAL
  Filled 2014-08-15: qty 2

## 2014-08-15 MED ORDER — CYCLOBENZAPRINE HCL 10 MG PO TABS: 10.0000 mg | ORAL_TABLET | Freq: Two times a day (BID) | ORAL | Status: DC | PRN

## 2014-08-15 NOTE — Progress Notes (Addendum)
  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     Subjective/Objective Assessment Detail:   [facility]   [address]   [place], [place] [id number]   [phone]   Pt seen last at [facility] on 08/13/14 to establish care and given referrals to pain clinic, psychiatry and neurology per 08/13/14/ [facility]      [facility] & 3 ED visits in last 6 months (08/14/14, 08/13/14,      Action/Plan:   EPIC updated with pcp   Action/Plan Detail:   Anticipated DC Date:  08/15/2014     Status Recommendation to Physician:   Result of Recommendation:    Other ED Services  Consult Working Plan    DC Planning Services  Other  Outpatient Services - Pt will follow up    Choice offered to / List presented to:            Status of service:  Completed, signed off  ED Comments:   ED Comments Detail:   08/15/14 1259 ED CM called by ED SW after Cm entered CM consult in for homelessness for ED SW. [facility]

## 2014-08-15 NOTE — ED Notes (Signed)
Per EMS, Pt lives at homeless shelter and was making his bed, pt bent over and felt a sharp pain in his back. Pt sts it felt like muscle spasms he has had before. Pt was able to slide over onto EMS bed but unable to move otherwise. Pt A&Ox4.

## 2014-08-15 NOTE — ED Notes (Signed)
Called [facility]. [facility] refusing to take patient to homeless shelter. Pt sts he has taken a cab in the past but has no money for a cab.

## 2014-08-15 NOTE — Progress Notes (Signed)
[facility], will return upon discharge. No further Clinical Social Work needs, signing off.   Byrd HesselbachKristen Halford Goetzke, LCSW [phone]  ED CSW 08/15/2014 1:00 PM

## 2014-08-15 NOTE — Discharge Instructions (Signed)

## 2014-08-15 NOTE — ED Notes (Signed)
Pt refusing another set of vitals.

## 2014-08-15 NOTE — ED Notes (Signed)
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

## 2014-08-15 NOTE — ED Provider Notes (Signed)
CSN: [id number]     Arrival date & time 08/15/14  1132 History   First MD Initiated Contact with Patient 08/15/14 1147     Chief Complaint  Patient presents with  . Back Pain     (Consider location/radiation/quality/duration/timing/severity/associated sxs/prior Treatment) Patient is a 58 y.o. male presenting with back pain. The history is provided by the patient.  Back Pain Location:  Lumbar spine Quality:  Aching Radiates to:  Does not radiate Pain severity:  Moderate Pain is:  Same all the time Onset quality:  Sudden Timing:  Constant Progression:  Worsening Chronicity:  Recurrent Context: recent injury and twisting   Relieved by:  Nothing Worsened by:  Movement Associated symptoms: no fever     Past Medical History  Diagnosis Date  . Necrotizing fasciitis   . Hypertension   . Chronic pain   . Parkinson disease   . Mental disorder   . Depression   . High cholesterol   . Myocardial infarction acute 07/16/2014  . Anginal pain   . Hypothyroidism   . Hepatitis C   . Headache     "weekly" (08/06/2014)  . DDD (degenerative disc disease)   . Degenerative disc disease   . Degenerative disc disease   . DDD (degenerative disc disease)   . DDD (degenerative disc disease)   . DDD (degenerative disc disease), lumbar   . Arthritis     "toes, knees, hips, back" (08/06/2014)  . Chronic lower back pain   . Anxiety    Past Surgical History  Procedure Laterality Date  . Hip surgery Left 04/2009    Necrotizing Fasciitis  . Appendectomy  ~ 2008  . Ankle surgery Left 1977    "tendon repair"  . Incision and drainage of wound N/A 11/26/2012    Procedure: IRRIGATION AND DEBRIDEMENT WOUND;  Surgeon: Emelia LoronMatthew Wakefield, MD;  [facility];  Service: General;  Laterality: N/A;  . Wound exploration N/A 11/26/2012    Procedure: WOUND EXPLORATION;  Surgeon: Emelia LoronMatthew Wakefield, MD;  [facility];  Service: General;  Laterality: N/A;   No family history on file. History  Substance  Use Topics  . Smoking status: Current Every Day Smoker -- 1.00 packs/day for 49 years    Types: Cigarettes  . Smokeless tobacco: Current User    Types: Chew  . Alcohol Use: 1.5 oz/week     Comment: 08/06/2014 "I'm in facilities where you can't drink"    Review of Systems  Constitutional: Negative for fever.  Respiratory: Negative for cough and shortness of breath.   Musculoskeletal: Positive for back pain.  All other systems reviewed and are negative.     Allergies  Review of patient's allergies indicates no known allergies.  Home Medications   Prior to Admission medications   Medication Sig Start Date End Date Taking? Authorizing Provider  acetaminophen-codeine (TYLENOL #3) 300-30 MG per tablet Take 1 tablet by mouth every 8 (eight) hours as needed. 08/13/14  Yes Quentin Angstlugbemiga E Jegede, MD  aspirin EC 81 MG EC tablet Take 1 tablet (81 mg total) by mouth daily. 08/09/14  Yes Marianne L York, PA-C  atorvastatin (LIPITOR) 80 MG tablet Take 1 tablet (80 mg total) by mouth daily. 08/09/14  Yes Tora KindredMarianne L York, PA-C  carvedilol (COREG) 3.125 MG tablet Take 1 tablet (3.125 mg total) by mouth 2 (two) times daily with a meal. 08/09/14  Yes Tora KindredMarianne L York, PA-C  ciprofloxacin (CIPRO) 500 MG tablet Take 1 tablet (500 mg total) by mouth 2 (two)  times daily. 08/09/14  Yes Marianne L York, PA-C  cyclobenzaprine (FLEXERIL) 10 MG tablet Take 1 tablet (10 mg total) by mouth 3 (three) times daily. 08/09/14  Yes Marianne L York, PA-C  DULoxetine (CYMBALTA) 60 MG capsule Take 1 capsule (60 mg total) by mouth daily. For depression/pain control 08/09/14  Yes Tora KindredMarianne L York, PA-C  fentaNYL (DURAGESIC - DOSED MCG/HR) 25 MCG/HR patch Place 1 patch (25 mcg total) onto the skin every 3 (three) days. 08/09/14  Yes Marianne L York, PA-C  lisinopril (PRINIVIL,ZESTRIL) 2.5 MG tablet Take 2.5 mg by mouth daily.   Yes Historical Provider, MD  Multiple Vitamin (MULTIVITAMIN WITH MINERALS) TABS tablet Take 1 tablet by mouth  daily. 08/09/14  Yes Marianne L York, PA-C  OLANZapine (ZYPREXA) 10 MG tablet Take 1 tablet (10 mg total) by mouth at bedtime. 08/09/14  Yes Marianne L York, PA-C  nicotine (NICODERM CQ - DOSED IN MG/24 HOURS) 21 mg/24hr patch Place 1 patch (21 mg total) onto the skin at bedtime. Patient not taking: Reported on 08/13/2014 08/09/14   Stephani PoliceMarianne L York, PA-C  oxyCODONE-acetaminophen (PERCOCET) 5-325 MG per tablet Take 1 tablet by mouth every 4 (four) hours as needed. 08/09/14   Tora KindredMarianne L York, PA-C  ranolazine (RANEXA) 500 MG 12 hr tablet Take 1 tablet (500 mg total) by mouth daily. 08/09/14   Tora KindredMarianne L York, PA-C  traZODone (DESYREL) 100 MG tablet Take 100 mg by mouth at bedtime.    Historical Provider, MD   BP 129/93 mmHg  Pulse 87  Temp(Src) 98 F (36.7 C) (Oral)  Resp 20  SpO2 99% Physical Exam  Constitutional: He is oriented to person, place, and time. He appears well-developed and well-nourished. No distress.  HENT:  Head: Normocephalic and atraumatic.  Mouth/Throat: Oropharynx is clear and moist. No oropharyngeal exudate.  Eyes: EOM are normal. Pupils are equal, round, and reactive to light.  Neck: Normal range of motion. Neck supple.  Cardiovascular: Normal rate and regular rhythm.  Exam reveals no friction rub.   No murmur heard. Pulmonary/Chest: Effort normal and breath sounds normal. No respiratory distress. He has no wheezes. He has no rales.  Abdominal: He exhibits no distension. There is no tenderness. There is no rebound.  Musculoskeletal: Normal range of motion. He exhibits no edema.       Lumbar back: He exhibits tenderness (R lower back tenderness).  Neurological: He is alert and oriented to person, place, and time. No cranial nerve deficit. He exhibits abnormal muscle tone (spasticity in lower extremities). Coordination normal.  Skin: He is not diaphoretic.  Nursing note and vitals reviewed.   ED Course  Procedures (including critical care time) Labs Review Labs Reviewed  - No data to display  Imaging Review No results found.   EKG Interpretation None      MDM   Final diagnoses:  Back spasm    83M with hx of spastic paraplegia presents with back pain. Hx of back spasms. Patient was doing chores at his homeless shelter and felt his R back begin to spasm. Doesn't have flexeril. Recently treated for UTI.  R back with TTP, no acute spasm noted. Denies fecal/urinary incontinence. Similar spasticity in lower legs to prior. Will treat with flexeril. Small amount of narcotic given here. Drug database shows large amount of narcotics given recently. Given flexeril, will not give narcotics. I confronted patient about his large amount of narcotic prescribed and informed him I will not give him narcotics today.  THIS PATIENT IS EXHIBITING  NARCOTIC SEEKING BEHAVIOR.  Elwin Mocha, MD 08/15/14 [phone]

## 2014-08-22 ENCOUNTER — Encounter (HOSPITAL_COMMUNITY): Payer: Self-pay | Admitting: *Deleted

## 2014-08-22 ENCOUNTER — Emergency Department (HOSPITAL_COMMUNITY)
Admission: EM | Admit: 2014-08-22 | Discharge: 2014-08-22 | Disposition: A | Discharge status: Home / Self Care | Payer: Medicaid Other | Attending: Emergency Medicine | Admitting: Emergency Medicine

## 2014-08-22 DIAGNOSIS — Z720 Tobacco use: Secondary | ICD-10-CM | POA: Insufficient documentation

## 2014-08-22 DIAGNOSIS — E780 Pure hypercholesterolemia: Secondary | ICD-10-CM | POA: Diagnosis not present

## 2014-08-22 DIAGNOSIS — M549 Dorsalgia, unspecified: Secondary | ICD-10-CM

## 2014-08-22 DIAGNOSIS — G8929 Other chronic pain: Secondary | ICD-10-CM | POA: Diagnosis not present

## 2014-08-22 DIAGNOSIS — G20 Parkinson's disease: Secondary | ICD-10-CM | POA: Diagnosis not present

## 2014-08-22 DIAGNOSIS — F419 Anxiety disorder, unspecified: Secondary | ICD-10-CM | POA: Diagnosis not present

## 2014-08-22 DIAGNOSIS — I252 Old myocardial infarction: Secondary | ICD-10-CM | POA: Insufficient documentation

## 2014-08-22 DIAGNOSIS — M545 Low back pain: Secondary | ICD-10-CM | POA: Diagnosis present

## 2014-08-22 DIAGNOSIS — I10 Essential (primary) hypertension: Secondary | ICD-10-CM | POA: Insufficient documentation

## 2014-08-22 DIAGNOSIS — M1990 Unspecified osteoarthritis, unspecified site: Secondary | ICD-10-CM | POA: Diagnosis not present

## 2014-08-22 DIAGNOSIS — Z79899 Other long term (current) drug therapy: Secondary | ICD-10-CM | POA: Insufficient documentation

## 2014-08-22 DIAGNOSIS — Z8619 Personal history of other infectious and parasitic diseases: Secondary | ICD-10-CM | POA: Diagnosis not present

## 2014-08-22 DIAGNOSIS — Z7982 Long term (current) use of aspirin: Secondary | ICD-10-CM | POA: Insufficient documentation

## 2014-08-22 DIAGNOSIS — F329 Major depressive disorder, single episode, unspecified: Secondary | ICD-10-CM | POA: Insufficient documentation

## 2014-08-22 HISTORY — DX: Alcohol dependence, uncomplicated: F10.20

## 2014-08-22 MED ORDER — OXYCODONE-ACETAMINOPHEN 5-325 MG PO TABS
2.0000 | ORAL_TABLET | Freq: Once | ORAL | Status: AC
  Administered 2014-08-22: 2 via ORAL
  Filled 2014-08-22: qty 2

## 2014-08-22 NOTE — ED Notes (Signed)
Per [facility] - pt w/ leg cramps x2 weeks - no other complaints at this time.

## 2014-08-22 NOTE — ED Provider Notes (Signed)
CSN: [id number]     Arrival date & time 08/22/14  0207 History   First MD Initiated Contact with Patient 08/22/14 0356     Chief Complaint  Patient presents with  . Back Pain     (Consider location/radiation/quality/duration/timing/severity/associated sxs/prior Treatment) The history is provided by the patient.  pt with hx ddd, chronic pain syndrome, c/o his chronic mid to low back pain. Denies current radicular pain. No new leg numbness or weakness. No perineal numbness. No urine retention. No fever or chills. Denies trauma or fall.  Pt indicates recently saw his pcp for same, and gives very lengthy explanation about insurance problems, and miscommunications between his new pcp, his neurologist and his pain specialist, and indicates all of this has led to delays in him getting pain medication prescriptions.   Pt denies recent injury or abrupt change in his pain.      Past Medical History  Diagnosis Date  . Necrotizing fasciitis   . Hypertension   . Chronic pain   . Parkinson disease   . Mental disorder   . Depression   . High cholesterol   . Myocardial infarction acute 07/16/2014  . Anginal pain   . Hypothyroidism   . Hepatitis C   . Headache     "weekly" (08/06/2014)  . DDD (degenerative disc disease)   . Degenerative disc disease   . Degenerative disc disease   . DDD (degenerative disc disease)   . DDD (degenerative disc disease)   . DDD (degenerative disc disease), lumbar   . Arthritis     "toes, knees, hips, back" (08/06/2014)  . Chronic lower back pain   . Anxiety   . Alcoholism    Past Surgical History  Procedure Laterality Date  . Hip surgery Left 04/2009    Necrotizing Fasciitis  . Appendectomy  ~ 2008  . Ankle surgery Left 1977    "tendon repair"  . Incision and drainage of wound N/A 11/26/2012    Procedure: IRRIGATION AND DEBRIDEMENT WOUND;  Surgeon: Emelia LoronMatthew Wakefield, MD;  [facility];  Service: General;  Laterality: N/A;  . Wound exploration N/A  11/26/2012    Procedure: WOUND EXPLORATION;  Surgeon: Emelia LoronMatthew Wakefield, MD;  [facility];  Service: General;  Laterality: N/A;   History reviewed. No pertinent family history. History  Substance Use Topics  . Smoking status: Current Every Day Smoker -- 1.00 packs/day for 49 years    Types: Cigarettes  . Smokeless tobacco: Current User    Types: Chew  . Alcohol Use: 1.5 oz/week     Comment: 08/06/2014 "I'm in facilities where you can't drink"    Review of Systems  Constitutional: Negative for fever and chills.  HENT: Negative for sore throat.   Eyes: Negative for redness.  Respiratory: Negative for cough and shortness of breath.   Cardiovascular: Negative for chest pain.  Gastrointestinal: Negative for vomiting, abdominal pain and diarrhea.  Genitourinary: Negative for flank pain.  Musculoskeletal: Positive for back pain. Negative for neck pain.  Skin: Negative for rash.  Neurological: Negative for weakness, numbness and headaches.  Hematological: Does not bruise/bleed easily.  Psychiatric/Behavioral: Negative for confusion.      Allergies  Review of patient's allergies indicates no known allergies.  Home Medications   Prior to Admission medications   Medication Sig Start Date End Date Taking? Authorizing Provider  atorvastatin (LIPITOR) 80 MG tablet Take 1 tablet (80 mg total) by mouth daily. 08/09/14  Yes Marianne L York, PA-C  carvedilol (COREG) 3.125  MG tablet Take 1 tablet (3.125 mg total) by mouth 2 (two) times daily with a meal. 08/09/14  Yes Tora KindredMarianne L York, PA-C  DULoxetine (CYMBALTA) 60 MG capsule Take 1 capsule (60 mg total) by mouth daily. For depression/pain control 08/09/14  Yes Tora KindredMarianne L York, PA-C  fentaNYL (DURAGESIC - DOSED MCG/HR) 25 MCG/HR patch Place 1 patch (25 mcg total) onto the skin every 3 (three) days. 08/09/14  Yes Marianne L York, PA-C  lisinopril (PRINIVIL,ZESTRIL) 2.5 MG tablet Take 2.5 mg by mouth daily.   Yes Historical Provider, MD   oxyCODONE-acetaminophen (PERCOCET) 5-325 MG per tablet Take 1 tablet by mouth every 4 (four) hours as needed. Patient taking differently: Take 1 tablet by mouth every 4 (four) hours as needed for moderate pain.  08/09/14  Yes Marianne L York, PA-C  acetaminophen-codeine (TYLENOL #3) 300-30 MG per tablet Take 1 tablet by mouth every 8 (eight) hours as needed. Patient not taking: Reported on 08/22/2014 08/13/14   Quentin Angstlugbemiga E Jegede, MD  aspirin EC 81 MG EC tablet Take 1 tablet (81 mg total) by mouth daily. Patient not taking: Reported on 08/22/2014 08/09/14   Stephani PoliceMarianne L York, PA-C  ciprofloxacin (CIPRO) 500 MG tablet Take 1 tablet (500 mg total) by mouth 2 (two) times daily. Patient not taking: Reported on 08/22/2014 08/09/14   Stephani PoliceMarianne L York, PA-C  cyclobenzaprine (FLEXERIL) 10 MG tablet Take 1 tablet (10 mg total) by mouth 3 (three) times daily. 08/09/14   Tora KindredMarianne L York, PA-C  cyclobenzaprine (FLEXERIL) 10 MG tablet Take 1 tablet (10 mg total) by mouth 2 (two) times daily as needed for muscle spasms. 08/15/14   Elwin MochaBlair Walden, MD  Multiple Vitamin (MULTIVITAMIN WITH MINERALS) TABS tablet Take 1 tablet by mouth daily. Patient not taking: Reported on 08/22/2014 08/09/14   Stephani PoliceMarianne L York, PA-C  nicotine (NICODERM CQ - DOSED IN MG/24 HOURS) 21 mg/24hr patch Place 1 patch (21 mg total) onto the skin at bedtime. Patient not taking: Reported on 08/13/2014 08/09/14   Tora KindredMarianne L York, PA-C  OLANZapine (ZYPREXA) 10 MG tablet Take 1 tablet (10 mg total) by mouth at bedtime. 08/09/14   Tora KindredMarianne L York, PA-C  ranolazine (RANEXA) 500 MG 12 hr tablet Take 1 tablet (500 mg total) by mouth daily. 08/09/14   Marianne L York, PA-C   BP 137/98 mmHg  Pulse 91  Temp(Src) 98 F (36.7 C) (Oral)  Resp 18  SpO2 97% Physical Exam  Constitutional: He is oriented to person, place, and time. He appears well-developed and well-nourished. No distress.  HENT:  Head: Atraumatic.  Eyes: Conjunctivae are normal.  Neck: Neck  supple. No tracheal deviation present.  Cardiovascular: Normal rate.   Pulmonary/Chest: Effort normal. No accessory muscle usage. No respiratory distress.  Abdominal: Soft. Bowel sounds are normal. He exhibits no distension and no mass. There is no tenderness. There is no rebound and no guarding.  Genitourinary:  No cva tenderness  Musculoskeletal: Normal range of motion. He exhibits no edema or tenderness.  tls spine non tender. Mild lumbar muscular tenderness.   Neurological: He is alert and oriented to person, place, and time.  Moves bil ext purposefully w good stre. Pt reports his functional ability c/w baseline.     Skin: Skin is warm and dry. He is not diaphoretic.  Psychiatric: He has a normal mood and affect.  Nursing note and vitals reviewed.   ED Course  Procedures (including critical care time) Labs Review   MDM   Pt requests pain  med.   Percocet po.  Reviewed nursing notes and prior charts for additional history.   Pt states here with his chronic back pain.  Indicates [facility], regarding his narcotic prescriptions, and raises similar issues with his chronic pain specialist and neurologist.  Pt w recent ed visit for same, was noted then w several rxs in [place] database.   Pt informed needs to obtain refills for narcotic meds if needed from his pain specialist and/or pcp.  Spine nt. Afeb. No gu c/o. No new neurologic symptoms.  Pt appears stable for d/c.     Suzi Roots, MD 08/22/14 [phone]

## 2014-08-22 NOTE — Discharge Instructions (Signed)
It was our pleasure to provide your ER care today - we hope that you feel better.  Take your pain medication as need.  If problems obtaining your pain medication from your doctor, try over the counter medication such as motrin or aleve.  Follow up with your doctor in coming week.  Return to ER if worse, new symptoms, fevers, medical emergency, other concern.     Chronic Back Pain  When back pain lasts longer than 3 months, it is called chronic back pain.People with chronic back pain often go through certain periods that are more intense (flare-ups).  CAUSES Chronic back pain can be caused by wear and tear (degeneration) on different structures in your back. These structures include:  The bones of your spine (vertebrae) and the joints surrounding your spinal cord and nerve roots (facets).  The strong, fibrous tissues that connect your vertebrae (ligaments). Degeneration of these structures may result in pressure on your nerves. This can lead to constant pain. HOME CARE INSTRUCTIONS  Avoid bending, heavy lifting, prolonged sitting, and activities which make the problem worse.  Take brief periods of rest throughout the day to reduce your pain. Lying down or standing usually is better than sitting while you are resting.  Take over-the-counter or prescription medicines only as directed by your caregiver. SEEK IMMEDIATE MEDICAL CARE IF:   You have weakness or numbness in one of your legs or feet.  You have trouble controlling your bladder or bowels.  You have nausea, vomiting, abdominal pain, shortness of breath, or fainting. Document Released: 09/30/2004 Document Revised: 11/15/2011 Document Reviewed: 08/07/2011 [facility] Patient Information 2015 [employer] [place]. This information is not intended to replace advice given to you by your health care provider. Make sure you discuss any questions you have with your health care provider.     Chronic Pain Chronic pain can be defined as  pain that is off and on and lasts for 3-6 months or longer. Many things cause chronic pain, which can make it difficult to make a diagnosis. There are many treatment options available for chronic pain. However, finding a treatment that works well for you may require trying various approaches until the right one is found. Many people benefit from a combination of two or more types of treatment to control their pain. SYMPTOMS  Chronic pain can occur anywhere in the body and can range from mild to very severe. Some types of chronic pain include:  Headache.  Low back pain.  Cancer pain.  Arthritis pain.  Neurogenic pain. This is pain resulting from damage to nerves. People with chronic pain may also have other symptoms such as:  Depression.  Anger.  Insomnia.  Anxiety. DIAGNOSIS  Your health care provider will help diagnose your condition over time. In many cases, the initial focus will be on excluding possible conditions that could be causing the pain. Depending on your symptoms, your health care provider may order tests to diagnose your condition. Some of these tests may include:   Blood tests.   CT scan.   MRI.   X-rays.   Ultrasounds.   Nerve conduction studies.  You may need to see a specialist.  TREATMENT  Finding treatment that works well may take time. You may be referred to a pain specialist. He or she may prescribe medicine or therapies, such as:   Mindful meditation or yoga.  Shots (injections) of numbing or pain-relieving medicines into the spine or area of pain.  Local electrical stimulation.  Acupuncture.  Massage therapy.   Aroma, color, light, or sound therapy.   Biofeedback.   Working with a physical therapist to keep from getting stiff.   Regular, gentle exercise.   Cognitive or behavioral therapy.   Group support.  Sometimes, surgery may be recommended.  HOME CARE INSTRUCTIONS   Take all medicines as directed by your  health care provider.   Lessen stress in your life by relaxing and doing things such as listening to calming music.   Exercise or be active as directed by your health care provider.   Eat a healthy diet and include things such as vegetables, fruits, fish, and lean meats in your diet.   Keep all follow-up appointments with your health care provider.   Attend a support group with others suffering from chronic pain. SEEK MEDICAL CARE IF:   Your pain gets worse.   You develop a new pain that was not there before.   You cannot tolerate medicines given to you by your health care provider.   You have new symptoms since your last visit with your health care provider.  SEEK IMMEDIATE MEDICAL CARE IF:   You feel weak.   You have decreased sensation or numbness.   You lose control of bowel or bladder function.   Your pain suddenly gets much worse.   You develop shaking.  You develop chills.  You develop confusion.  You develop chest pain.  You develop shortness of breath.  MAKE SURE YOU:  Understand these instructions.  Will watch your condition.  Will get help right away if you are not doing well or get worse. Document Released: 05/15/2002 Document Revised: 04/25/2013 Document Reviewed: 02/16/2013 [facility] Patient Information 2015 [place], [place]. This information is not intended to replace advice given to you by your health care provider. Make sure you discuss any questions you have with your health care provider.

## 2014-08-24 ENCOUNTER — Encounter (HOSPITAL_COMMUNITY): Payer: Self-pay | Admitting: Adult Health

## 2014-08-24 ENCOUNTER — Emergency Department (HOSPITAL_COMMUNITY)
Admission: EM | Admit: 2014-08-24 | Discharge: 2014-08-24 | Disposition: A | Discharge status: Home / Self Care | Payer: Medicaid Other | Attending: Emergency Medicine | Admitting: Emergency Medicine

## 2014-08-24 DIAGNOSIS — Z79899 Other long term (current) drug therapy: Secondary | ICD-10-CM | POA: Insufficient documentation

## 2014-08-24 DIAGNOSIS — I252 Old myocardial infarction: Secondary | ICD-10-CM | POA: Insufficient documentation

## 2014-08-24 DIAGNOSIS — E780 Pure hypercholesterolemia: Secondary | ICD-10-CM | POA: Diagnosis not present

## 2014-08-24 DIAGNOSIS — Z8619 Personal history of other infectious and parasitic diseases: Secondary | ICD-10-CM | POA: Diagnosis not present

## 2014-08-24 DIAGNOSIS — I209 Angina pectoris, unspecified: Secondary | ICD-10-CM | POA: Insufficient documentation

## 2014-08-24 DIAGNOSIS — I10 Essential (primary) hypertension: Secondary | ICD-10-CM | POA: Diagnosis not present

## 2014-08-24 DIAGNOSIS — M545 Low back pain: Secondary | ICD-10-CM | POA: Insufficient documentation

## 2014-08-24 DIAGNOSIS — M160 Bilateral primary osteoarthritis of hip: Secondary | ICD-10-CM | POA: Insufficient documentation

## 2014-08-24 DIAGNOSIS — M170 Bilateral primary osteoarthritis of knee: Secondary | ICD-10-CM | POA: Diagnosis not present

## 2014-08-24 DIAGNOSIS — Z7982 Long term (current) use of aspirin: Secondary | ICD-10-CM | POA: Insufficient documentation

## 2014-08-24 DIAGNOSIS — F419 Anxiety disorder, unspecified: Secondary | ICD-10-CM | POA: Insufficient documentation

## 2014-08-24 DIAGNOSIS — G8929 Other chronic pain: Secondary | ICD-10-CM | POA: Diagnosis not present

## 2014-08-24 DIAGNOSIS — Z720 Tobacco use: Secondary | ICD-10-CM | POA: Insufficient documentation

## 2014-08-24 DIAGNOSIS — F329 Major depressive disorder, single episode, unspecified: Secondary | ICD-10-CM | POA: Diagnosis not present

## 2014-08-24 DIAGNOSIS — M549 Dorsalgia, unspecified: Secondary | ICD-10-CM

## 2014-08-24 DIAGNOSIS — M4694 Unspecified inflammatory spondylopathy, thoracic region: Secondary | ICD-10-CM | POA: Insufficient documentation

## 2014-08-24 DIAGNOSIS — G20 Parkinson's disease: Secondary | ICD-10-CM | POA: Diagnosis not present

## 2014-08-24 DIAGNOSIS — R300 Dysuria: Secondary | ICD-10-CM | POA: Insufficient documentation

## 2014-08-24 LAB — URINALYSIS, ROUTINE W REFLEX MICROSCOPIC
Bilirubin Urine: NEGATIVE
Glucose, UA: NEGATIVE mg/dL
Ketones, ur: NEGATIVE mg/dL
Leukocytes, UA: NEGATIVE
Nitrite: NEGATIVE
PH: 6 (ref 5.0–8.0)
Protein, ur: 100 mg/dL — AB
SPECIFIC GRAVITY, URINE: 1.019 (ref 1.005–1.030)
Urobilinogen, UA: 1 mg/dL (ref 0.0–1.0)

## 2014-08-24 LAB — URINE MICROSCOPIC-ADD ON

## 2014-08-24 MED ORDER — OXYCODONE-ACETAMINOPHEN 5-325 MG PO TABS
2.0000 | ORAL_TABLET | Freq: Once | ORAL | Status: AC
Start: 2014-08-24 — End: 2014-08-24
  Administered 2014-08-24: 2 via ORAL
  Filled 2014-08-24: qty 2

## 2014-08-24 MED ORDER — CYCLOBENZAPRINE HCL 10 MG PO TABS
10.0000 mg | ORAL_TABLET | Freq: Once | ORAL | Status: AC
  Administered 2014-08-24: 10 mg via ORAL
  Filled 2014-08-24: qty 1

## 2014-08-24 MED ORDER — CYCLOBENZAPRINE HCL 10 MG PO TABS: 10.0000 mg | ORAL_TABLET | Freq: Two times a day (BID) | ORAL | Status: DC | PRN

## 2014-08-24 NOTE — ED Provider Notes (Signed)
CSN: [id number]     Arrival date & time 08/24/14  1510 History   First MD Initiated Contact with Patient 08/24/14 1646     Chief Complaint  Patient presents with  . Back Pain  . Dysuria     (Consider location/radiation/quality/duration/timing/severity/associated sxs/prior Treatment) HPI The patient reports she is here because she has ongoing problems getting his narcotic prescriptions filled. He has had problems with the wellness clinic and pain management and insurance. He was hoping that we could prescribe enough pain control medications to last him for 2 weeks. He reports his chronic problems with degenerative joint disease and low back pain. He reports without his pain medications he cannot adequately control his pain. There are no acute changes today. Past Medical History  Diagnosis Date  . Necrotizing fasciitis   . Hypertension   . Chronic pain   . Parkinson disease   . Mental disorder   . Depression   . High cholesterol   . Myocardial infarction acute 07/16/2014  . Anginal pain   . Hypothyroidism   . Hepatitis C   . Headache     "weekly" (08/06/2014)  . DDD (degenerative disc disease)   . Degenerative disc disease   . Degenerative disc disease   . DDD (degenerative disc disease)   . DDD (degenerative disc disease)   . DDD (degenerative disc disease), lumbar   . Arthritis     "toes, knees, hips, back" (08/06/2014)  . Chronic lower back pain   . Anxiety   . Alcoholism    Past Surgical History  Procedure Laterality Date  . Hip surgery Left 04/2009    Necrotizing Fasciitis  . Appendectomy  ~ 2008  . Ankle surgery Left 1977    "tendon repair"  . Incision and drainage of wound N/A 11/26/2012    Procedure: IRRIGATION AND DEBRIDEMENT WOUND;  Surgeon: Emelia Loron, MD;  Location: [facility] OR;  Service: General;  Laterality: N/A;  . Wound exploration N/A 11/26/2012    Procedure: WOUND EXPLORATION;  Surgeon: Emelia Loron, MD;  Location: [facility] OR;  Service: General;   Laterality: N/A;   History reviewed. No pertinent family history. History  Substance Use Topics  . Smoking status: Current Every Day Smoker -- 1.00 packs/day for 49 years    Types: Cigarettes  . Smokeless tobacco: Current User    Types: Chew  . Alcohol Use: 1.5 oz/week     Comment: 08/06/2014 "I'm in facilities where you can't drink"    Review of Systems 10 Systems reviewed and are negative for acute change except as noted in the HPI.    Allergies  Review of patient's allergies indicates no known allergies.  Home Medications   Prior to Admission medications   Medication Sig Start Date End Date Taking? Authorizing Provider  acetaminophen-codeine (TYLENOL #3) 300-30 MG per tablet Take 1 tablet by mouth every 8 (eight) hours as needed. Patient not taking: Reported on 08/22/2014 08/13/14   Quentin Angst, MD  aspirin EC 81 MG EC tablet Take 1 tablet (81 mg total) by mouth daily. Patient not taking: Reported on 08/22/2014 08/09/14   Stephani Police, PA-C  atorvastatin (LIPITOR) 80 MG tablet Take 1 tablet (80 mg total) by mouth daily. 08/09/14   Tora Kindred York, PA-C  carvedilol (COREG) 3.125 MG tablet Take 1 tablet (3.125 mg total) by mouth 2 (two) times daily with a meal. 08/09/14   Stephani Police, PA-C  ciprofloxacin (CIPRO) 500 MG tablet Take 1 tablet (500 mg total) by  mouth 2 (two) times daily. Patient not taking: Reported on 08/22/2014 08/09/14   Stephani PoliceMarianne L York, PA-C  cyclobenzaprine (FLEXERIL) 10 MG tablet Take 1 tablet (10 mg total) by mouth 3 (three) times daily. 08/09/14   Tora KindredMarianne L York, PA-C  cyclobenzaprine (FLEXERIL) 10 MG tablet Take 1 tablet (10 mg total) by mouth 2 (two) times daily as needed for muscle spasms. 08/15/14   Elwin MochaBlair Walden, MD  cyclobenzaprine (FLEXERIL) 10 MG tablet Take 1 tablet (10 mg total) by mouth 2 (two) times daily as needed for muscle spasms. 08/24/14   Arby BarretteMarcy Angle Karel, MD  DULoxetine (CYMBALTA) 60 MG capsule Take 1 capsule (60 mg total) by mouth  daily. For depression/pain control 08/09/14   Stephani PoliceMarianne L York, PA-C  fentaNYL (DURAGESIC - DOSED MCG/HR) 25 MCG/HR patch Place 1 patch (25 mcg total) onto the skin every 3 (three) days. 08/09/14   Tora KindredMarianne L York, PA-C  lisinopril (PRINIVIL,ZESTRIL) 2.5 MG tablet Take 2.5 mg by mouth daily.    Historical Provider, MD  Multiple Vitamin (MULTIVITAMIN WITH MINERALS) TABS tablet Take 1 tablet by mouth daily. Patient not taking: Reported on 08/22/2014 08/09/14   Stephani PoliceMarianne L York, PA-C  nicotine (NICODERM CQ - DOSED IN MG/24 HOURS) 21 mg/24hr patch Place 1 patch (21 mg total) onto the skin at bedtime. Patient not taking: Reported on 08/13/2014 08/09/14   Tora KindredMarianne L York, PA-C  OLANZapine (ZYPREXA) 10 MG tablet Take 1 tablet (10 mg total) by mouth at bedtime. 08/09/14   Stephani PoliceMarianne L York, PA-C  oxyCODONE-acetaminophen (PERCOCET) 5-325 MG per tablet Take 1 tablet by mouth every 4 (four) hours as needed. Patient taking differently: Take 1 tablet by mouth every 4 (four) hours as needed for moderate pain.  08/09/14   Tora KindredMarianne L York, PA-C  ranolazine (RANEXA) 500 MG 12 hr tablet Take 1 tablet (500 mg total) by mouth daily. 08/09/14   Marianne L York, PA-C   BP 150/103 mmHg  Pulse 99  Temp(Src) 98.4 F (36.9 C) (Oral)  Resp 20  SpO2 97% Physical Exam  Constitutional: He is oriented to person, place, and time.  The patient is morbidly obese. He has no respiratory distress. He is alert and interactive. He is lying on the stretcher.  HENT:  Head: Normocephalic and atraumatic.  Eyes: EOM are normal.  Cardiovascular: Normal rate, regular rhythm and normal heart sounds.   Pulmonary/Chest: Effort normal and breath sounds normal. No respiratory distress. He has no wheezes. He has no rales.  Abdominal: Soft. Bowel sounds are normal. He exhibits no distension. There is no tenderness. There is no guarding.  Musculoskeletal:  Patient endorses tenderness along the lumbar back to palpation. This is bilateral symmetric.  Visual inspection palpation of the back is otherwise normal. The patient does not have normal lower extremity used due to chronic disability.  Neurological: He is alert and oriented to person, place, and time.  Skin: Skin is warm and dry.  Psychiatric: He has a normal mood and affect.    ED Course  Procedures (including critical care time) Labs Review Labs Reviewed  URINALYSIS, ROUTINE W REFLEX MICROSCOPIC    Imaging Review No results found.   EKG Interpretation None      MDM   Final diagnoses:  Chronic back pain   The patient has a very long history of chronic back pain and management thereof. At this point time he is answering difficulty navigating the medical system to obtain his chronic narcotic prescriptions. He'll be given 2 Percocet and a Flexeril  by mouth for pain control is time. Patient is advised that no prescriptions can be filled for him through the emergency department. He does have a family doctor and a pain management specialist.    Arby BarretteMarcy Kenly Xiao, MD 08/24/14 [phone]

## 2014-08-24 NOTE — ED Notes (Addendum)
Presents with lower back pain and extremity-chronic pain-pt recently ran out of pain medication yesterday-he has an appointment at pain management on January 4th but is not able to last that long.  He ran out of oxycontin 10 mg. Would like a prescription until he goes to pain management, he also endorses difficulty urinating-recently d/c from hospital with UTI. Pt is wheel chair bound. Unable to urinate at this time

## 2014-08-24 NOTE — ED Notes (Signed)
Pt dc with instructions via tb. Pt alert x4 respirations easy non labored. wc to lobby

## 2014-08-24 NOTE — Discharge Instructions (Signed)
Chronic Back Pain ° When back pain lasts longer than 3 months, it is called chronic back pain. People with chronic back pain often go through certain periods that are more intense (flare-ups).  °CAUSES °Chronic back pain can be caused by wear and tear (degeneration) on different structures in your back. These structures include: °· The bones of your spine (vertebrae) and the joints surrounding your spinal cord and nerve roots (facets). °· The strong, fibrous tissues that connect your vertebrae (ligaments). °Degeneration of these structures may result in pressure on your nerves. This can lead to constant pain. °HOME CARE INSTRUCTIONS °· Avoid bending, heavy lifting, prolonged sitting, and activities which make the problem worse. °· Take brief periods of rest throughout the day to reduce your pain. Lying down or standing usually is better than sitting while you are resting. °· Take over-the-counter or prescription medicines only as directed by your caregiver. °SEEK IMMEDIATE MEDICAL CARE IF:  °· You have weakness or numbness in one of your legs or feet. °· You have trouble controlling your bladder or bowels. °· You have nausea, vomiting, abdominal pain, shortness of breath, or fainting. °Document Released: 09/30/2004 Document Revised: 11/15/2011 Document Reviewed: 08/07/2011 °ExitCare® Patient Information ©2015 [employer]. This information is not intended to replace advice given to you by your health care provider. Make sure you discuss any questions you have with your health care provider. ° ° °Emergency Department Resource Guide °1) Find a Doctor and Pay Out of Pocket °Although you won't have to find out who is covered by your insurance plan, it is a good idea to ask around and get recommendations. You will then need to call the office and see if the doctor you have chosen will accept you as a new patient and what types of options they offer for patients who are self-pay. Some doctors offer discounts or will set  up payment plans for their patients who do not have insurance, but you will need to ask so you aren't surprised when you get to your appointment. ° °2) Contact Your Local Health Department °Not all health departments have doctors that can see patients for sick visits, but many do, so it is worth a call to see if yours does. If you don't know where your local health department is, you can check in your phone book. The CDC also has a tool to help you locate your state's health department, and many state websites also have listings of all of their local health departments. ° °3) Find a Walk-in Clinic °If your illness is not likely to be very severe or complicated, you may want to try a walk in clinic. These are popping up all over the country in pharmacies, drugstores, and shopping centers. They're usually staffed by nurse practitioners or physician assistants that have been trained to treat common illnesses and complaints. They're usually fairly quick and inexpensive. However, if you have serious medical issues or chronic medical problems, these are probably not your best option. ° °No Primary Care Doctor: °- Call [facility] Connect at  832-8000 - they can help you locate a primary care doctor that  accepts your insurance, provides certain services, etc. °- Physician Referral Service- 1-800-533-3463 ° °Chronic Pain Problems: °Organization         Address  Phone   Notes  °[facility]  (336) 297-2271 Patients need to be referred by their primary care doctor.  ° °Medication Assistance: °Organization         Address    Phone   Notes  °[place] [facility] [address] °[place], [place] [address] (336) 641-8030 --Must be a resident of [place] °-- Must have NO insurance coverage whatsoever (no Medicaid/ Medicare, etc.) °-- The pt. MUST have a primary care doctor that directs their care regularly and follows them in the community °  °[facility]  (866) 331-1348    °[employer]  (888) 892-1162   ° °Agencies that provide inexpensive medical care: °Organization         Address  Phone   Notes  °[facility]  (336) 832-8035   °[facility]    (336) 832-7272   °[facility] [address] °[place], [place] [address] (336) 832-4777   °[facility] [address], °[place] (336) 271-4999   °[facility]    (336) 373-0678   °[facility]    (336) 272-1050   °[facility] ° [address], [place] Phone:  (336) 832-4444, Fax:  (336) 832-4440 Hours of Operation:  9 am - 6 pm, M-F.  Also accepts Medicaid/Medicare and self-pay.  °[facility] ° [address], [place] Phone: (336) 832-3150, Fax: (336) 832-3151. Hours of Operation:  8:30 am - 5:30 pm, M-F.  Also accepts Medicaid and self-pay.  °[facility] [place] [address], [place] Phone: (336) 878-6027   °[facility] [address], [place], [place] (336)723-1848, Ext. 123 Mondays & Thursdays: 7-9 AM.  First 15 patients are seen on a first come, first serve basis. °  ° °Medicaid-accepting [place] Providers: ° °Organization         Address  Phone   Notes  °[facility] [address], [place] (336) 641-2100 Also accepts self-pay patients.  °[facility] [address], [place] ° (336) 856-9996   °[facility] [address], [place] (336) 288-8857   °[facility] [address] [place] [address], [place] (336) 299-7000   °Veita Bland [address], [place]  ° (336) 373-1557 Only accepts [place] Access Medicaid patients after they have their name applied to their card.  ° °Self-Pay (no insurance) in [place]: ° °Organization         Address  Phone   Notes  °Sickle Cell Patients, [facility] [address],  [place] (336) 832-1970   °[facility] [address], [place] (336) 832-4400   °[facility] ° [address], [place] (336) 992-4800   °[facility]/Dr. Osei-Bonsu ° [address] [place] [address], [place] or [address], [place] (336) 841-8500 Phone number for both [place] and [place] locations is the same.  °[facility] [address], [place] (336) 299-0000   °[facility] [address] [place] [address], [place] or [address] (336) 852-7530 °(336) 878-2260   °[facility] [address], [place] (336) 350-1642, phone; (336) 294-5005, fax Sees patients 1st and 3rd Saturday of every month.  Must not qualify for public or private insurance (i.e. Medicaid, Medicare,  Health Choice, Veterans' Benefits) • Household income should be no more than 200% of the poverty level •The clinic cannot treat you if you are pregnant or think you are pregnant •   Sexually transmitted diseases are not treated at the clinic.  ° ° °Dental Care: °Organization         Address  Phone  Notes  °[facility] [facility] [address], [place] (336) 641-6152 Accepts children up to age 21 who are enrolled in Medicaid or [place] Health Choice; pregnant women with a Medicaid card; and children who have applied for Medicaid or [place] Health Choice, but were declined, whose parents can pay a reduced fee at time of service.  °[facility] [place]  [address], [place] (336) 641-7733 Accepts children up to age 21 who are enrolled in Medicaid or [place] Health Choice; pregnant women with a Medicaid card; and children who have applied for Medicaid or [place] Health Choice, but were declined, whose parents can pay a reduced fee at time of service.  °[facility] ° [address], [place] (336)  641-4533 Patients are seen by appointment only. Walk-ins are not accepted. [facility] will see patients 18 years of age and older. °Monday - Tuesday (8am-5pm) °Most Wednesdays (8:30-5pm) °$30 per visit, cash only  °[facility] ° [address], [place] (336) 641-4533 Patients are seen by appointment only. Walk-ins are not accepted. [facility] will see patients 18 years of age and older. °One Wednesday Evening (Monthly: Volunteer Based).  $30 per visit, cash only  °[facility]  (919) 537-3737 for adults; Children under age 4, call Graduate Pediatric Dentistry at (919) [phone]. Children aged 4-14, please call (919) [phone] to request a pediatric application. ° Dental services are provided in all areas of dental care including fillings, crowns and bridges, complete and partial dentures, implants, gum treatment, root canals, and extractions. Preventive care is also provided. Treatment is provided to both adults and children. °Patients are selected via a lottery and there is often a waiting list. °  °[facility] [address], °[place] ° (336) 763-8833 [email or web address] °  °[facility] [address], [place], [place] (336)723-1848, Ext. 123 Second and Fourth Thursday of each month, opens at 6:30 AM; Clinic ends at 9 AM.  Patients are seen on a first-come first-served basis, and a limited number are seen during each clinic.  ° °[facility] ° [address], [place], [place] (336) 723-7904   Eligibility Requirements °You must have lived in [place], [place], or [place] counties for at least the last three months. °  You cannot be eligible for state or federal sponsored healthcare insurance, including Veterans Administration, Medicaid, or Medicare. °  You generally cannot be eligible for healthcare insurance through your employer.  °  How to apply: °Eligibility screenings are held every Tuesday and Wednesday afternoon  from 1:00 pm until 4:00 pm. You do not need an appointment for the interview!  °[facility] [address], [place], [place] 336-631-2330   °[facility]  336-342-8273   °[facility]  336-703-3100   °[facility]  336-570-6415   ° °Behavioral Health Resources in the Community: °Intensive Outpatient Programs °Organization         Address  Phone  Notes  °[facility] [address], [place], [place] 336-878-6098   °[facility] [address], [place], [place] 336-832-9800   °[facility] [address], , [place] °   336-882-2125   °[facility] [address],  °[place], [place] 1-800-853-5163 or 336-641-4981   °Substance Abuse Resources °Organization         Address  Phone  Notes  °[facility]  336-882-2125   °[facility]  336-784-9470   °[facility]  336-285-9073   °[facility]  336-845-3988   °Residential & Outpatient Substance Abuse Program  1-800-659-3381   °Psychological Services °Organization         Address  Phone  Notes  °[facility]  336- 832-9600   °[facility]  336- 378-7881   °[facility] [address], [place] 1-800-853-5163 or 336-641-4981   ° °Mobile Crisis Teams °Organization         Address  Phone  Notes  °[facility], Mobile Crisis Care Unit  1-877-626-1772   °Assertive °[facility] ° [address] [place], [place] 336-834-9664   °Sharon DeEsch [address] °[place] 336-554-5454   ° °Self-Help/Support Groups °Organization         Address  Phone             Notes  °[facility] - variety of support groups  336- 373-1402 Call for more information  °Narcotics Anonymous (NA), [facility] [address], °[place]  2 meetings at this location  ° °Residential Treatment  Programs °Organization         Address  Phone  Notes  °[facility] [address],    °[place]  1-866-801-8205   °[facility] ° [address], [place], [place] 704-293-8524   °[facility] [address], [place] 336-845-3988 Admissions: 8am-3pm M-F  °[facility] [address]    °[place], [place] 336-841-1104   °[facility] [address], [place], [place] 336-379-7146   °[facility] [address]  °[place], [place] 336-285-9073   °[facility] - Intensive Outpatient [address], [place], [place] 336-852-3033   °[facility] [address]  °[place], [place] 1-877-615-2722 or 336-784-9470   °[facility] [address] [place], [place] 336-227-7417 Accepts Medicaid  °[facility] [address]  °[place] 1-800-659-3381 Substance Abuse/Addiction Treatment  ° °[place] Behavioral Health Resources °Organization         Address  Phone  Notes  °[facility]  (888) 581-9988   °Julie Brannon, PhD [address] [place], [place]   (336) 349-5553 or (336) 951-0000   °[facility]   [address] °[place], [place] (336) 349-4454   °[facility] [address], [place], [place] (336) 342-8316 Insurance/Medicaid/sponsorship through [facility]  °[facility] [address]                                    [place], [place] (336) 342-8316 Therapy/tele-psych/case  °[facility] [address]  ° [place], [place] (336) 349-2233    °Dr. Arfeen  (336) 349-4544   °[facility] [place]  [employer] [place] [facility] 1) [address], [place] °2) [address], [place] °3)  [address], [place] (336) 349-3220 °(336) 342-7768 ° °(336) 342-8140   °[place] [facility] (336) 342-1394 or (336) 342-3537 (  After Hours)    ° ° ° °

## 2014-08-27 ENCOUNTER — Encounter (HOSPITAL_COMMUNITY): Payer: Self-pay | Admitting: Emergency Medicine

## 2014-08-27 ENCOUNTER — Emergency Department (HOSPITAL_COMMUNITY)
Admission: EM | Admit: 2014-08-27 | Discharge: 2014-08-27 | Disposition: A | Discharge status: Home / Self Care | Payer: Medicaid Other | Attending: Emergency Medicine | Admitting: Emergency Medicine

## 2014-08-27 DIAGNOSIS — Z79899 Other long term (current) drug therapy: Secondary | ICD-10-CM | POA: Diagnosis not present

## 2014-08-27 DIAGNOSIS — F329 Major depressive disorder, single episode, unspecified: Secondary | ICD-10-CM | POA: Diagnosis not present

## 2014-08-27 DIAGNOSIS — E780 Pure hypercholesterolemia: Secondary | ICD-10-CM | POA: Diagnosis not present

## 2014-08-27 DIAGNOSIS — I10 Essential (primary) hypertension: Secondary | ICD-10-CM | POA: Diagnosis not present

## 2014-08-27 DIAGNOSIS — I209 Angina pectoris, unspecified: Secondary | ICD-10-CM | POA: Insufficient documentation

## 2014-08-27 DIAGNOSIS — M4690 Unspecified inflammatory spondylopathy, site unspecified: Secondary | ICD-10-CM | POA: Diagnosis not present

## 2014-08-27 DIAGNOSIS — M549 Dorsalgia, unspecified: Secondary | ICD-10-CM | POA: Diagnosis present

## 2014-08-27 DIAGNOSIS — M179 Osteoarthritis of knee, unspecified: Secondary | ICD-10-CM | POA: Diagnosis not present

## 2014-08-27 DIAGNOSIS — I252 Old myocardial infarction: Secondary | ICD-10-CM | POA: Diagnosis not present

## 2014-08-27 DIAGNOSIS — Z7982 Long term (current) use of aspirin: Secondary | ICD-10-CM | POA: Diagnosis not present

## 2014-08-27 DIAGNOSIS — F419 Anxiety disorder, unspecified: Secondary | ICD-10-CM | POA: Insufficient documentation

## 2014-08-27 DIAGNOSIS — Z8619 Personal history of other infectious and parasitic diseases: Secondary | ICD-10-CM | POA: Diagnosis not present

## 2014-08-27 DIAGNOSIS — Z720 Tobacco use: Secondary | ICD-10-CM | POA: Insufficient documentation

## 2014-08-27 DIAGNOSIS — G20 Parkinson's disease: Secondary | ICD-10-CM | POA: Diagnosis not present

## 2014-08-27 DIAGNOSIS — G8220 Paraplegia, unspecified: Secondary | ICD-10-CM | POA: Insufficient documentation

## 2014-08-27 DIAGNOSIS — M25552 Pain in left hip: Secondary | ICD-10-CM | POA: Insufficient documentation

## 2014-08-27 DIAGNOSIS — M169 Osteoarthritis of hip, unspecified: Secondary | ICD-10-CM | POA: Diagnosis not present

## 2014-08-27 DIAGNOSIS — G8929 Other chronic pain: Secondary | ICD-10-CM | POA: Insufficient documentation

## 2014-08-27 DIAGNOSIS — M79605 Pain in left leg: Secondary | ICD-10-CM | POA: Diagnosis not present

## 2014-08-27 LAB — URINALYSIS, ROUTINE W REFLEX MICROSCOPIC
Bilirubin Urine: NEGATIVE
Glucose, UA: NEGATIVE mg/dL
Ketones, ur: NEGATIVE mg/dL
LEUKOCYTES UA: NEGATIVE
Nitrite: NEGATIVE
PROTEIN: NEGATIVE mg/dL
Specific Gravity, Urine: 1.007 (ref 1.005–1.030)
UROBILINOGEN UA: 0.2 mg/dL (ref 0.0–1.0)
pH: 6 (ref 5.0–8.0)

## 2014-08-27 LAB — URINE MICROSCOPIC-ADD ON

## 2014-08-27 MED ORDER — OXYCODONE-ACETAMINOPHEN 5-325 MG PO TABS
2.0000 | ORAL_TABLET | Freq: Once | ORAL | Status: AC
  Administered 2014-08-27: 2 via ORAL
  Filled 2014-08-27: qty 2

## 2014-08-27 MED ORDER — OXYCODONE-ACETAMINOPHEN 10-325 MG PO TABS: 1.0000 | ORAL_TABLET | ORAL | Status: DC | PRN

## 2014-08-27 MED ORDER — CYCLOBENZAPRINE HCL 10 MG PO TABS
10.0000 mg | ORAL_TABLET | Freq: Three times a day (TID) | ORAL | Status: DC | PRN
Start: 2014-08-27 — End: 2014-09-01

## 2014-08-27 NOTE — ED Notes (Signed)
Pt has a wound on his left hip r/t necrotizing fasciitis, pt states he has had his wound for 5 years, pt states he is not being treated for it at this time.

## 2014-08-27 NOTE — ED Notes (Signed)
Pt presents to the department with chronic back, left hip, left leg pain. Pt incontinent upon initial assessment. Pt reports he "dribbles" frequently. Pt is A&O X4. Pt reports he is not able to ambulate r/t pain, pt is able to move all extremities.

## 2014-08-27 NOTE — ED Notes (Signed)
[facility] called  

## 2014-08-27 NOTE — ED Notes (Signed)
[facility] contacted to return patient home 

## 2014-08-27 NOTE — ED Provider Notes (Signed)
CSN: [id number]     Arrival date & time 08/27/14  0103 History   First MD Initiated Contact with Patient 08/27/14 0201     Chief Complaint  Patient presents with  . Back Pain  . Leg Pain   Patient is a 58 y.o. male presenting with leg pain. The history is provided by the patient. No language interpreter was used.  Leg Pain HPI Comments: PCP: No PCP Per Patient Jeffrey BonitoDean S Bellizzi is a 58 y.o. male who presents to the Emergency Department complaining of chronic back, left hip and left leg pain. Pt reports he has been out of his pain meds since 12/19 and seeks refills of his meds. Pt notes that despite his efforts, he has been unable to navigate the health system to get his meds refilled. Pt reports that he is scheduled for an appointment with pain management on 09/09/14.   Past Medical History  Diagnosis Date  . Necrotizing fasciitis   . Hypertension   . Chronic pain   . Parkinson disease   . Mental disorder   . Depression   . High cholesterol   . Myocardial infarction acute 07/16/2014  . Anginal pain   . Hypothyroidism   . Hepatitis C   . Headache     "weekly" (08/06/2014)  . DDD (degenerative disc disease)   . Degenerative disc disease   . Degenerative disc disease   . DDD (degenerative disc disease)   . DDD (degenerative disc disease)   . DDD (degenerative disc disease), lumbar   . Arthritis     "toes, knees, hips, back" (08/06/2014)  . Chronic lower back pain   . Anxiety   . Alcoholism    Past Surgical History  Procedure Laterality Date  . Hip surgery Left 04/2009    Necrotizing Fasciitis  . Appendectomy  ~ 2008  . Ankle surgery Left 1977    "tendon repair"  . Incision and drainage of wound N/A 11/26/2012    Procedure: IRRIGATION AND DEBRIDEMENT WOUND;  Surgeon: Emelia LoronMatthew Wakefield, MD;  [facility];  Service: General;  Laterality: N/A;  . Wound exploration N/A 11/26/2012    Procedure: WOUND EXPLORATION;  Surgeon: Emelia LoronMatthew Wakefield, MD;  [facility];  Service:  General;  Laterality: N/A;   No family history on file. History  Substance Use Topics  . Smoking status: Current Every Day Smoker -- 1.00 packs/day for 49 years    Types: Cigarettes  . Smokeless tobacco: Current User    Types: Chew  . Alcohol Use: 1.5 oz/week     Comment: 08/06/2014 "I'm in facilities where you can't drink"    Review of Systems  Constitutional: Negative for chills.  Musculoskeletal:       Left hip and leg pain   Psychiatric/Behavioral: Negative for confusion.  All other systems reviewed and are negative.  Allergies  Review of patient's allergies indicates no known allergies.  Home Medications   Prior to Admission medications   Medication Sig Start Date End Date Taking? Authorizing Provider  acetaminophen-codeine (TYLENOL #3) 300-30 MG per tablet Take 1 tablet by mouth every 8 (eight) hours as needed. Patient not taking: Reported on 08/22/2014 08/13/14   Quentin Angstlugbemiga E Jegede, MD  aspirin EC 81 MG EC tablet Take 1 tablet (81 mg total) by mouth daily. Patient not taking: Reported on 08/22/2014 08/09/14   Stephani PoliceMarianne L York, PA-C  atorvastatin (LIPITOR) 80 MG tablet Take 1 tablet (80 mg total) by mouth daily. 08/09/14   Stephani PoliceMarianne L York, PA-C  carvedilol (COREG) 3.125 MG tablet Take 1 tablet (3.125 mg total) by mouth 2 (two) times daily with a meal. 08/09/14   Stephani PoliceMarianne L York, PA-C  ciprofloxacin (CIPRO) 500 MG tablet Take 1 tablet (500 mg total) by mouth 2 (two) times daily. Patient not taking: Reported on 08/22/2014 08/09/14   Stephani PoliceMarianne L York, PA-C  cyclobenzaprine (FLEXERIL) 10 MG tablet Take 1 tablet (10 mg total) by mouth 3 (three) times daily. 08/09/14   Tora KindredMarianne L York, PA-C  cyclobenzaprine (FLEXERIL) 10 MG tablet Take 1 tablet (10 mg total) by mouth 2 (two) times daily as needed for muscle spasms. 08/15/14   Elwin MochaBlair Walden, MD  cyclobenzaprine (FLEXERIL) 10 MG tablet Take 1 tablet (10 mg total) by mouth 2 (two) times daily as needed for muscle spasms. 08/24/14   Arby BarretteMarcy  Pfeiffer, MD  DULoxetine (CYMBALTA) 60 MG capsule Take 1 capsule (60 mg total) by mouth daily. For depression/pain control 08/09/14   Stephani PoliceMarianne L York, PA-C  fentaNYL (DURAGESIC - DOSED MCG/HR) 25 MCG/HR patch Place 1 patch (25 mcg total) onto the skin every 3 (three) days. 08/09/14   Tora KindredMarianne L York, PA-C  lisinopril (PRINIVIL,ZESTRIL) 2.5 MG tablet Take 2.5 mg by mouth daily.    Historical Provider, MD  Multiple Vitamin (MULTIVITAMIN WITH MINERALS) TABS tablet Take 1 tablet by mouth daily. Patient not taking: Reported on 08/22/2014 08/09/14   Stephani PoliceMarianne L York, PA-C  nicotine (NICODERM CQ - DOSED IN MG/24 HOURS) 21 mg/24hr patch Place 1 patch (21 mg total) onto the skin at bedtime. Patient not taking: Reported on 08/13/2014 08/09/14   Tora KindredMarianne L York, PA-C  OLANZapine (ZYPREXA) 10 MG tablet Take 1 tablet (10 mg total) by mouth at bedtime. 08/09/14   Stephani PoliceMarianne L York, PA-C  oxyCODONE-acetaminophen (PERCOCET) 5-325 MG per tablet Take 1 tablet by mouth every 4 (four) hours as needed. Patient taking differently: Take 1 tablet by mouth every 4 (four) hours as needed for moderate pain.  08/09/14   Tora KindredMarianne L York, PA-C  ranolazine (RANEXA) 500 MG 12 hr tablet Take 1 tablet (500 mg total) by mouth daily. 08/09/14   Stephani PoliceMarianne L York, PA-C   Triage Vitals: BP 129/80 mmHg  Pulse 104  Temp(Src) 98.1 F (36.7 C) (Oral)  Resp 12  Ht 6\' 4"  (1.93 m)  Wt 263 lb (119.296 kg)  BMI 32.03 kg/m2  SpO2 96% Physical Exam  Constitutional: He is oriented to person, place, and time. He appears well-developed and well-nourished. No distress.  HENT:  Head: Normocephalic and atraumatic.  Eyes: Conjunctivae and EOM are normal. Pupils are equal, round, and reactive to light.  Neck: Normal range of motion. Neck supple. No JVD present.  Cardiovascular: Normal rate, regular rhythm and normal heart sounds.   No murmur heard. Pulmonary/Chest: Effort normal and breath sounds normal. He has no wheezes. He has no rales. He exhibits no  tenderness.  Abdominal: Soft. Bowel sounds are normal. He exhibits no distension and no mass. There is no tenderness.  Musculoskeletal: Normal range of motion. He exhibits no edema.  Lymphadenopathy:    He has no cervical adenopathy.  Neurological: He is alert and oriented to person, place, and time. No cranial nerve deficit.  Spastic paraparesis.   Skin: Skin is warm and dry. No rash noted.  Psychiatric: He has a normal mood and affect. His behavior is normal. Thought content normal.  Nursing note and vitals reviewed.   ED Course  Procedures (including critical care time) DIAGNOSTIC STUDIES: Oxygen Saturation is 96% on  RA, adequate by my interpretation.    COORDINATION OF CARE: 2:12 AM-Discussed treatment plan which includes meds with pt at bedside and pt agreed to plan.   Labs Review Results for orders placed or performed during the hospital encounter of 08/27/14  Urinalysis, Routine w reflex microscopic  Result Value Ref Range   Color, Urine YELLOW YELLOW   APPearance CLEAR CLEAR   Specific Gravity, Urine 1.007 1.005 - 1.030   pH 6.0 5.0 - 8.0   Glucose, UA NEGATIVE NEGATIVE mg/dL   Hgb urine dipstick TRACE (A) NEGATIVE   Bilirubin Urine NEGATIVE NEGATIVE   Ketones, ur NEGATIVE NEGATIVE mg/dL   Protein, ur NEGATIVE NEGATIVE mg/dL   Urobilinogen, UA 0.2 0.0 - 1.0 mg/dL   Nitrite NEGATIVE NEGATIVE   Leukocytes, UA NEGATIVE NEGATIVE  Urine microscopic-add on  Result Value Ref Range   Squamous Epithelial / LPF RARE RARE   WBC, UA 0-2 <3 WBC/hpf   RBC / HPF 3-6 <3 RBC/hpf   Bacteria, UA RARE RARE   MDM   Final diagnoses:  Chronic back pain  Paraplegia    Chronic back pain. Patient states she has an appointment to be established in a pain management clinic in 2 weeks. I reviewed his records in [facility] and they to confirm recent office visit where he was advised to get his narcotics from a pain management and narcotics were not prescribed. Also reviewed his  record on the [place] controlled substance reporting website and prescriptions filled match with his description of what he has been getting. There is no acute change which would necessitate repeat imaging. He is discharged with prescription for oxycodone acetaminophen and is advised to try to use the medication judiciously to make it last until he is seen at pain management.  I personally performed the services described in this documentation, which was scribed in my presence. The recorded information has been reviewed and is accurate.     Dione Booze, MD 08/27/14 [phone]

## 2014-08-27 NOTE — Discharge Instructions (Signed)
Keep your appointment with the pain management clinic. Once you are seen there, you will need to get all your narcotic prescriptions through them.  Chronic Back Pain  When back pain lasts longer than 3 months, it is called chronic back pain.People with chronic back pain often go through certain periods that are more intense (flare-ups).  CAUSES Chronic back pain can be caused by wear and tear (degeneration) on different structures in your back. These structures include:  The bones of your spine (vertebrae) and the joints surrounding your spinal cord and nerve roots (facets).  The strong, fibrous tissues that connect your vertebrae (ligaments). Degeneration of these structures may result in pressure on your nerves. This can lead to constant pain. HOME CARE INSTRUCTIONS  Avoid bending, heavy lifting, prolonged sitting, and activities which make the problem worse.  Take brief periods of rest throughout the day to reduce your pain. Lying down or standing usually is better than sitting while you are resting.  Take over-the-counter or prescription medicines only as directed by your caregiver. SEEK IMMEDIATE MEDICAL CARE IF:   You have weakness or numbness in one of your legs or feet.  You have trouble controlling your bladder or bowels.  You have nausea, vomiting, abdominal pain, shortness of breath, or fainting. Document Released: 09/30/2004 Document Revised: 11/15/2011 Document Reviewed: 08/07/2011 [facility] Patient Information 2015 [employer] [place]. This information is not intended to replace advice given to you by your health care provider. Make sure you discuss any questions you have with your health care provider.

## 2014-08-31 ENCOUNTER — Emergency Department (HOSPITAL_COMMUNITY): Payer: Medicaid Other

## 2014-08-31 ENCOUNTER — Encounter (HOSPITAL_COMMUNITY): Payer: Self-pay | Admitting: Emergency Medicine

## 2014-08-31 ENCOUNTER — Inpatient Hospital Stay (HOSPITAL_COMMUNITY)
Admission: EM | Admit: 2014-08-31 | Discharge: 2014-09-06 | DRG: 287 | Disposition: A | Discharge status: Home / Self Care | Payer: Medicaid Other | Attending: Cardiovascular Disease | Admitting: Cardiovascular Disease

## 2014-08-31 DIAGNOSIS — I255 Ischemic cardiomyopathy: Secondary | ICD-10-CM | POA: Diagnosis present

## 2014-08-31 DIAGNOSIS — R079 Chest pain, unspecified: Secondary | ICD-10-CM

## 2014-08-31 DIAGNOSIS — F419 Anxiety disorder, unspecified: Secondary | ICD-10-CM | POA: Diagnosis present

## 2014-08-31 DIAGNOSIS — F1290 Cannabis use, unspecified, uncomplicated: Secondary | ICD-10-CM | POA: Diagnosis present

## 2014-08-31 DIAGNOSIS — E039 Hypothyroidism, unspecified: Secondary | ICD-10-CM | POA: Diagnosis present

## 2014-08-31 DIAGNOSIS — M545 Low back pain: Secondary | ICD-10-CM | POA: Diagnosis present

## 2014-08-31 DIAGNOSIS — F1490 Cocaine use, unspecified, uncomplicated: Secondary | ICD-10-CM | POA: Diagnosis present

## 2014-08-31 DIAGNOSIS — Z7982 Long term (current) use of aspirin: Secondary | ICD-10-CM

## 2014-08-31 DIAGNOSIS — I472 Ventricular tachycardia, unspecified: Secondary | ICD-10-CM

## 2014-08-31 DIAGNOSIS — G8220 Paraplegia, unspecified: Secondary | ICD-10-CM | POA: Diagnosis present

## 2014-08-31 DIAGNOSIS — F17220 Nicotine dependence, chewing tobacco, uncomplicated: Secondary | ICD-10-CM | POA: Diagnosis present

## 2014-08-31 DIAGNOSIS — G20 Parkinson's disease: Secondary | ICD-10-CM | POA: Diagnosis present

## 2014-08-31 DIAGNOSIS — E669 Obesity, unspecified: Secondary | ICD-10-CM | POA: Diagnosis present

## 2014-08-31 DIAGNOSIS — M5136 Other intervertebral disc degeneration, lumbar region: Secondary | ICD-10-CM | POA: Diagnosis present

## 2014-08-31 DIAGNOSIS — Z993 Dependence on wheelchair: Secondary | ICD-10-CM

## 2014-08-31 DIAGNOSIS — Z5900 Homelessness unspecified: Secondary | ICD-10-CM

## 2014-08-31 DIAGNOSIS — F99 Mental disorder, not otherwise specified: Secondary | ICD-10-CM

## 2014-08-31 DIAGNOSIS — G8929 Other chronic pain: Secondary | ICD-10-CM | POA: Diagnosis present

## 2014-08-31 DIAGNOSIS — I252 Old myocardial infarction: Secondary | ICD-10-CM

## 2014-08-31 DIAGNOSIS — E785 Hyperlipidemia, unspecified: Secondary | ICD-10-CM | POA: Diagnosis present

## 2014-08-31 DIAGNOSIS — R55 Syncope and collapse: Secondary | ICD-10-CM

## 2014-08-31 DIAGNOSIS — I10 Essential (primary) hypertension: Secondary | ICD-10-CM | POA: Diagnosis present

## 2014-08-31 DIAGNOSIS — I5042 Chronic combined systolic (congestive) and diastolic (congestive) heart failure: Secondary | ICD-10-CM | POA: Diagnosis present

## 2014-08-31 DIAGNOSIS — I2510 Atherosclerotic heart disease of native coronary artery without angina pectoris: Secondary | ICD-10-CM | POA: Diagnosis present

## 2014-08-31 DIAGNOSIS — F17210 Nicotine dependence, cigarettes, uncomplicated: Secondary | ICD-10-CM | POA: Diagnosis present

## 2014-08-31 DIAGNOSIS — Z6832 Body mass index (BMI) 32.0-32.9, adult: Secondary | ICD-10-CM

## 2014-08-31 DIAGNOSIS — F319 Bipolar disorder, unspecified: Secondary | ICD-10-CM | POA: Diagnosis present

## 2014-08-31 HISTORY — DX: Osteomyelitis, unspecified: M86.9

## 2014-08-31 HISTORY — DX: Reserved for concepts with insufficient information to code with codable children: IMO0002

## 2014-08-31 HISTORY — DX: Atherosclerotic heart disease of native coronary artery without angina pectoris: I25.10

## 2014-08-31 LAB — CBC
HEMATOCRIT: 39.3 % (ref 39.0–52.0)
Hemoglobin: 12.8 g/dL — ABNORMAL LOW (ref 13.0–17.0)
MCH: 27.8 pg (ref 26.0–34.0)
MCHC: 32.6 g/dL (ref 30.0–36.0)
MCV: 85.4 fL (ref 78.0–100.0)
Platelets: 275 10*3/uL (ref 150–400)
RBC: 4.6 MIL/uL (ref 4.22–5.81)
RDW: 14.8 % (ref 11.5–15.5)
WBC: 8.9 10*3/uL (ref 4.0–10.5)

## 2014-08-31 LAB — I-STAT TROPONIN, ED: Troponin i, poc: 0.04 ng/mL (ref 0.00–0.08)

## 2014-08-31 MED ORDER — SODIUM CHLORIDE 0.9 % IV BOLUS (SEPSIS)
250.0000 mL | Freq: Once | INTRAVENOUS | Status: AC
  Administered 2014-09-01: 250 mL via INTRAVENOUS

## 2014-08-31 MED ORDER — SODIUM CHLORIDE 0.9 % IV BOLUS (SEPSIS): 1000.0000 mL | Freq: Once | INTRAVENOUS | Status: DC

## 2014-08-31 MED ORDER — SODIUM CHLORIDE 0.9 % IV SOLN: Freq: Once | INTRAVENOUS | Status: DC

## 2014-08-31 NOTE — ED Notes (Signed)
Patient with chest pain that started 30 minutes ago, had a syncopal episode with it.  Patient had a feeling of going in and out prior to the syncopal episode.  Patient is CAOx4 at this time, patient does have some chest heaviness at this time, was given one SL nitro and 324mg  of ASA en route to ED.  No shortness of breath.  ST at 104 on monitor.

## 2014-08-31 NOTE — ED Provider Notes (Signed)
CSN: [id number]     Arrival date & time 08/31/14  2221 History  This chart was scribed for Audree CamelScott T Ashayla Subia, MD by Abel PrestoKara Demonbreun, ED Scribe. This patient was seen in room D36C/D36C and the patient's care was started at 11:26 PM.    Chief Complaint  Patient presents with  . Chest Pain    HPI  HPI Comments: Jeffrey [name] is a 58 y.o. male with PMHx of MI on 07/16/14 who presents to the Emergency Department complaining of 10/10 chest pain with onset around 10 pm. Pt notes he got up to use the restroom and upon returning to his bed he suddenly felt hot, felt a heaviness in his chest, felt as if "the lights were strobing," and felt as if he was going to lose consciousness.  He notes associated SOB and states he was unable to move. He denies feeling ill prior to onset. Pt notes the pain is currently 3/10.  Pt states symptoms felt similar to MI.   [facility] and states he was prescribed medication and denies stents. Pt has DDD and uses a wheelchair.  Pt has had a cough and congestion for 4-5 days. Pt denies fever and LOC.   Past Medical History  Diagnosis Date  . Necrotizing fasciitis   . Hypertension   . Chronic pain   . Parkinson disease   . Mental disorder   . Depression   . High cholesterol   . Myocardial infarction acute 07/16/2014  . Anginal pain   . Hypothyroidism   . Hepatitis C   . Headache     "weekly" (08/06/2014)  . DDD (degenerative disc disease)   . Degenerative disc disease   . Degenerative disc disease   . DDD (degenerative disc disease)   . DDD (degenerative disc disease)   . DDD (degenerative disc disease), lumbar   . Arthritis     "toes, knees, hips, back" (08/06/2014)  . Chronic lower back pain   . Anxiety   . Alcoholism    Past Surgical History  Procedure Laterality Date  . Hip surgery Left 04/2009    Necrotizing Fasciitis  . Appendectomy  ~ 2008  . Ankle surgery Left 1977    "tendon repair"  . Incision and drainage of wound N/A  11/26/2012    Procedure: IRRIGATION AND DEBRIDEMENT WOUND;  Surgeon: Emelia LoronMatthew Wakefield, MD;  [facility];  Service: General;  Laterality: N/A;  . Wound exploration N/A 11/26/2012    Procedure: WOUND EXPLORATION;  Surgeon: Emelia LoronMatthew Wakefield, MD;  [facility];  Service: General;  Laterality: N/A;   History reviewed. No pertinent family history. History  Substance Use Topics  . Smoking status: Current Some Day Smoker -- 1.00 packs/day for 49 years    Types: Cigarettes  . Smokeless tobacco: Current User    Types: Chew  . Alcohol Use: 1.5 oz/week     Comment: 08/06/2014 "I'm in facilities where you can't drink"    Review of Systems  Constitutional: Negative for fever.  Eyes: Positive for visual disturbance.  Respiratory: Positive for shortness of breath.   Cardiovascular: Positive for chest pain.  Neurological: Negative for syncope.      Allergies  Review of patient's allergies indicates no known allergies.  Home Medications   Prior to Admission medications   Medication Sig Start Date End Date Taking? Authorizing Provider  acetaminophen-codeine (TYLENOL #3) 300-30 MG per tablet Take 1 tablet by mouth every 8 (eight) hours as needed. Patient  not taking: Reported on 08/22/2014 08/13/14   Quentin Angst, MD  aspirin EC 81 MG EC tablet Take 1 tablet (81 mg total) by mouth daily. Patient not taking: Reported on 08/22/2014 08/09/14   Stephani Police, PA-C  atorvastatin (LIPITOR) 80 MG tablet Take 1 tablet (80 mg total) by mouth daily. 08/09/14   Tora Kindred York, PA-C  carvedilol (COREG) 3.125 MG tablet Take 1 tablet (3.125 mg total) by mouth 2 (two) times daily with a meal. 08/09/14   Stephani Police, PA-C  ciprofloxacin (CIPRO) 500 MG tablet Take 1 tablet (500 mg total) by mouth 2 (two) times daily. Patient not taking: Reported on 08/22/2014 08/09/14   Stephani Police, PA-C  cyclobenzaprine (FLEXERIL) 10 MG tablet Take 1 tablet (10 mg total) by mouth 3 (three) times daily. 08/09/14    Tora Kindred York, PA-C  cyclobenzaprine (FLEXERIL) 10 MG tablet Take 1 tablet (10 mg total) by mouth 3 (three) times daily as needed for muscle spasms. 08/27/14   Dione Booze, MD  DULoxetine (CYMBALTA) 60 MG capsule Take 1 capsule (60 mg total) by mouth daily. For depression/pain control 08/09/14   Stephani Police, PA-C  fentaNYL (DURAGESIC - DOSED MCG/HR) 25 MCG/HR patch Place 1 patch (25 mcg total) onto the skin every 3 (three) days. 08/09/14   Tora Kindred York, PA-C  lisinopril (PRINIVIL,ZESTRIL) 2.5 MG tablet Take 2.5 mg by mouth daily.    Historical Provider, MD  Multiple Vitamin (MULTIVITAMIN WITH MINERALS) TABS tablet Take 1 tablet by mouth daily. Patient not taking: Reported on 08/22/2014 08/09/14   Stephani Police, PA-C  nicotine (NICODERM CQ - DOSED IN MG/24 HOURS) 21 mg/24hr patch Place 1 patch (21 mg total) onto the skin at bedtime. Patient not taking: Reported on 08/13/2014 08/09/14   Tora Kindred York, PA-C  OLANZapine (ZYPREXA) 10 MG tablet Take 1 tablet (10 mg total) by mouth at bedtime. 08/09/14   Stephani Police, PA-C  oxyCODONE-acetaminophen (PERCOCET) 10-325 MG per tablet Take 1 tablet by mouth every 4 (four) hours as needed for pain. 08/27/14   Dione Booze, MD  oxyCODONE-acetaminophen (PERCOCET) 5-325 MG per tablet Take 1 tablet by mouth every 4 (four) hours as needed. Patient taking differently: Take 1 tablet by mouth every 4 (four) hours as needed for moderate pain.  08/09/14   Tora Kindred York, PA-C  ranolazine (RANEXA) 500 MG 12 hr tablet Take 1 tablet (500 mg total) by mouth daily. 08/09/14   Marianne L York, PA-C   BP 107/68 mmHg  Pulse 100  Resp 19  SpO2 99% Physical Exam  Constitutional: He is oriented to person, place, and time. He appears well-developed and well-nourished.  HENT:  Head: Normocephalic.  Eyes: Conjunctivae are normal.  Neck: Normal range of motion. Neck supple.  Cardiovascular: Normal rate, regular rhythm and normal heart sounds.  Exam reveals no friction  rub.   No murmur heard. Pulmonary/Chest: Effort normal and breath sounds normal. No respiratory distress. He has no wheezes. He has no rales.  Abdominal: Soft. Bowel sounds are normal. He exhibits no distension. There is no tenderness. There is no rebound and no guarding.  Musculoskeletal: Normal range of motion. He exhibits no edema (lower extremity).  Neurological: He is alert and oriented to person, place, and time.  Skin: Skin is warm and dry.  Psychiatric: He has a normal mood and affect. His behavior is normal.  Nursing note and vitals reviewed.   ED Course  Procedures (including critical care time) DIAGNOSTIC  STUDIES: Oxygen Saturation is 98% on room air, normal by my interpretation.    COORDINATION OF CARE: 11:33 PM Discussed treatment plan with patient at beside, the patient agrees with the plan and has no further questions at this time.   Labs Review Labs Reviewed  CBC - Abnormal; Notable for the following:    Hemoglobin 12.8 (*)    All other components within normal limits  BASIC METABOLIC PANEL - Abnormal; Notable for the following:    Glucose, Bld 116 (*)    All other components within normal limits  I-STAT TROPOININ, ED    Imaging Review Dg Chest 2 View  09/01/2014   CLINICAL DATA:  Acute onset of generalized chest pain and heaviness. Shortness of breath. Initial encounter.  EXAM: CHEST  2 VIEW  COMPARISON:  Chest radiograph performed 08/06/2014  FINDINGS: The lungs are well-aerated. Mild left perihilar airspace opacity could reflect a mild infectious process. Mild vascular congestion is seen. Mild right basilar atelectasis is noted. There is no evidence of pleural effusion or pneumothorax.  The heart is normal in size; the mediastinal contour is within normal limits. No acute osseous abnormalities are seen.  IMPRESSION: Mild left perihilar airspace opacity could reflect a mild infectious process. Mild vascular congestion seen. Mild right basilar atelectasis noted.    Electronically Signed   By: Roanna RaiderJeffery  Chang M.D.   On: 09/01/2014 00:25     EKG Interpretation   Date/Time:  Saturday August 31 2014 22:31:17 EST Ventricular Rate:  112 PR Interval:  181 QRS Duration: 113 QT Interval:  371 QTC Calculation: 506 R Axis:   -65 Text Interpretation:  Sinus tachycardia Multiple ventricular premature  complexes Borderline IVCD with LAD Inferior infarct, old Anterior infarct,  old Lateral leads are also involved artifact limits interpretation.  otherwise similar to Aug 06 2014 Confirmed by Criss AlvineGOLDSTON  MD, Holland Kotter [phone]([id number]) on  08/31/2014 11:03:54 PM      MDM   Final diagnoses:  Chest pain  Near syncope    11:49 PM D/w Dr. Herbie BaltimoreHarding, who reviewed EKG and agrees it's concerning but based [facility] and with patient basically pain free now, will hold on emergent cath but may need one this admission  Cardiology has seen, recommend medicine admission given his multiple other issues. Do not feel this is STEMI, and chest pain symptoms are resolved. Official cardiology consult delayed due to several other consults, when they evaluated they recommend medicine admission.  I personally performed the services described in this documentation, which was scribed in my presence. The recorded information has been reviewed and is accurate.      Audree CamelScott T Josiephine Simao, MD 09/01/14 [phone]

## 2014-08-31 NOTE — ED Notes (Signed)
Pt monitored by pulse ox, bp cuff, and 12-lead. 

## 2014-08-31 NOTE — ED Notes (Signed)
Patient transported to x-ray. ?

## 2014-09-01 DIAGNOSIS — R079 Chest pain, unspecified: Secondary | ICD-10-CM

## 2014-09-01 DIAGNOSIS — F99 Mental disorder, not otherwise specified: Secondary | ICD-10-CM

## 2014-09-01 DIAGNOSIS — I5042 Chronic combined systolic (congestive) and diastolic (congestive) heart failure: Secondary | ICD-10-CM

## 2014-09-01 DIAGNOSIS — I10 Essential (primary) hypertension: Secondary | ICD-10-CM | POA: Diagnosis present

## 2014-09-01 DIAGNOSIS — E669 Obesity, unspecified: Secondary | ICD-10-CM | POA: Diagnosis present

## 2014-09-01 DIAGNOSIS — G8220 Paraplegia, unspecified: Secondary | ICD-10-CM | POA: Diagnosis present

## 2014-09-01 DIAGNOSIS — I472 Ventricular tachycardia, unspecified: Secondary | ICD-10-CM

## 2014-09-01 DIAGNOSIS — Z5900 Homelessness unspecified: Secondary | ICD-10-CM

## 2014-09-01 DIAGNOSIS — R072 Precordial pain: Secondary | ICD-10-CM

## 2014-09-01 DIAGNOSIS — E785 Hyperlipidemia, unspecified: Secondary | ICD-10-CM | POA: Diagnosis present

## 2014-09-01 DIAGNOSIS — E039 Hypothyroidism, unspecified: Secondary | ICD-10-CM | POA: Diagnosis present

## 2014-09-01 DIAGNOSIS — I2510 Atherosclerotic heart disease of native coronary artery without angina pectoris: Secondary | ICD-10-CM | POA: Diagnosis present

## 2014-09-01 LAB — BASIC METABOLIC PANEL
Anion gap: 10 (ref 5–15)
Anion gap: 8 (ref 5–15)
BUN: 18 mg/dL (ref 6–23)
BUN: 11 mg/dL (ref 6–23)
CHLORIDE: 107 meq/L (ref 96–112)
CHLORIDE: 109 meq/L (ref 96–112)
CO2: 22 mmol/L (ref 19–32)
CO2: 24 mmol/L (ref 19–32)
Calcium: 8.8 mg/dL (ref 8.4–10.5)
Calcium: 8.7 mg/dL (ref 8.4–10.5)
Creatinine, Ser: 0.83 mg/dL (ref 0.50–1.35)
Creatinine, Ser: 0.61 mg/dL (ref 0.50–1.35)
GFR calc Af Amer: >90 mL/min (ref >90)
GFR calc non Af Amer: >90 mL/min (ref >90)
GLUCOSE: 116 mg/dL — AB (ref 70–99)
GLUCOSE: 97 mg/dL (ref 70–99)
POTASSIUM: 4.3 mmol/L (ref 3.5–5.1)
Potassium: 3.5 mmol/L (ref 3.5–5.1)
Sodium: 139 mmol/L (ref 135–145)
Sodium: 141 mmol/L (ref 135–145)

## 2014-09-01 LAB — CBC
HCT: 43 % (ref 39.0–52.0)
Hemoglobin: 14.2 g/dL (ref 13.0–17.0)
MCH: 28.2 pg (ref 26.0–34.0)
MCHC: 33 g/dL (ref 30.0–36.0)
MCV: 85.3 fL (ref 78.0–100.0)
Platelets: 321 10*3/uL (ref 150–400)
RBC: 5.04 MIL/uL (ref 4.22–5.81)
RDW: 15 % (ref 11.5–15.5)
WBC: 9.3 10*3/uL (ref 4.0–10.5)

## 2014-09-01 LAB — TROPONIN I
TROPONIN I: 0.04 ng/mL — AB (ref <0.031)
Troponin I: 0.04 ng/mL — ABNORMAL HIGH (ref <0.031)
Troponin I: 0.05 ng/mL — ABNORMAL HIGH (ref <0.031)

## 2014-09-01 LAB — CREATININE, SERUM: Creatinine, Ser: 0.75 mg/dL (ref 0.50–1.35)

## 2014-09-01 LAB — T4, FREE: FREE T4: 0.93 ng/dL (ref 0.80–1.80)

## 2014-09-01 LAB — MRSA PCR SCREENING: MRSA by PCR: NEGATIVE

## 2014-09-01 LAB — MAGNESIUM: Magnesium: 2 mg/dL (ref 1.5–2.5)

## 2014-09-01 LAB — TSH: TSH: 3.578 u[IU]/mL (ref 0.350–4.500)

## 2014-09-01 MED ORDER — CETYLPYRIDINIUM CHLORIDE 0.05 % MT LIQD
7.0000 mL | Freq: Two times a day (BID) | OROMUCOSAL | Status: DC
  Administered 2014-09-01 – 2014-09-05 (×9): 7 mL via OROMUCOSAL

## 2014-09-01 MED ORDER — SODIUM CHLORIDE 0.9 % IV SOLN
1.0000 mL/kg/h | INTRAVENOUS | Status: DC
  Administered 2014-09-02: 1 mL/kg/h via INTRAVENOUS

## 2014-09-01 MED ORDER — ENOXAPARIN SODIUM 40 MG/0.4ML ~~LOC~~ SOLN
40.0000 mg | SUBCUTANEOUS | Status: DC
  Administered 2014-09-01 – 2014-09-05 (×5): 40 mg via SUBCUTANEOUS
  Filled 2014-09-01 (×5): qty 0.4

## 2014-09-01 MED ORDER — AMIODARONE HCL IN DEXTROSE 360-4.14 MG/200ML-% IV SOLN
INTRAVENOUS | Status: AC
  Administered 2014-09-01: 33.3 mL
  Filled 2014-09-01: qty 200

## 2014-09-01 MED ORDER — ATORVASTATIN CALCIUM 80 MG PO TABS
80.0000 mg | ORAL_TABLET | Freq: Every day | ORAL | Status: DC
  Administered 2014-09-01 – 2014-09-06 (×6): 80 mg via ORAL
  Filled 2014-09-01 (×7): qty 1

## 2014-09-01 MED ORDER — ASPIRIN EC 81 MG PO TBEC
81.0000 mg | DELAYED_RELEASE_TABLET | Freq: Every day | ORAL | Status: DC
  Administered 2014-09-01 – 2014-09-02 (×2): 81 mg via ORAL
  Filled 2014-09-01 (×2): qty 1

## 2014-09-01 MED ORDER — BACITRACIN-NEOMYCIN-POLYMYXIN OINTMENT TUBE
TOPICAL_OINTMENT | Freq: Three times a day (TID) | CUTANEOUS | Status: DC
  Administered 2014-09-01 – 2014-09-03 (×4): via TOPICAL
  Administered 2014-09-03: 1 via TOPICAL
  Administered 2014-09-03 – 2014-09-06 (×6): via TOPICAL
  Filled 2014-09-01 (×5): qty 15

## 2014-09-01 MED ORDER — LIDOCAINE HCL (CARDIAC) 20 MG/ML IV SOLN
INTRAVENOUS | Status: AC
  Administered 2014-09-01: 100 mg
  Filled 2014-09-01: qty 5

## 2014-09-01 MED ORDER — LIDOCAINE HCL (CARDIAC) 20 MG/ML IV SOLN
INTRAVENOUS | Status: AC
  Administered 2014-09-01: 20 mg
  Filled 2014-09-01: qty 5

## 2014-09-01 MED ORDER — AMIODARONE HCL IN DEXTROSE 360-4.14 MG/200ML-% IV SOLN
30.0000 mg/h | INTRAVENOUS | Status: DC
  Administered 2014-09-01 – 2014-09-03 (×5): 30 mg/h via INTRAVENOUS
  Filled 2014-09-01 (×13): qty 200

## 2014-09-01 MED ORDER — OXYCODONE HCL 5 MG PO TABS
10.0000 mg | ORAL_TABLET | ORAL | Status: DC | PRN
  Administered 2014-09-02 – 2014-09-03 (×6): 10 mg via ORAL
  Filled 2014-09-01 (×6): qty 2

## 2014-09-01 MED ORDER — AMIODARONE LOAD VIA INFUSION
150.0000 mg | Freq: Once | INTRAVENOUS | Status: AC
Start: 2014-09-01 — End: 2014-09-01
  Administered 2014-09-01: 150 mg via INTRAVENOUS
  Filled 2014-09-01: qty 83.34

## 2014-09-01 MED ORDER — DULOXETINE HCL 60 MG PO CPEP
60.0000 mg | ORAL_CAPSULE | Freq: Every day | ORAL | Status: DC
  Administered 2014-09-01 – 2014-09-06 (×6): 60 mg via ORAL
  Filled 2014-09-01 (×7): qty 1

## 2014-09-01 MED ORDER — FENTANYL 25 MCG/HR TD PT72
25.0000 ug | MEDICATED_PATCH | TRANSDERMAL | Status: DC
  Administered 2014-09-01 – 2014-09-04 (×2): 25 ug via TRANSDERMAL
  Filled 2014-09-01 (×2): qty 1

## 2014-09-01 MED ORDER — GI COCKTAIL ~~LOC~~: 30.0000 mL | Freq: Four times a day (QID) | ORAL | Status: DC | PRN

## 2014-09-01 MED ORDER — OXYCODONE HCL 5 MG PO TABS
5.0000 mg | ORAL_TABLET | Freq: Four times a day (QID) | ORAL | Status: DC | PRN
  Administered 2014-09-01 (×2): 5 mg via ORAL
  Filled 2014-09-01 (×2): qty 1

## 2014-09-01 MED ORDER — AMIODARONE HCL IN DEXTROSE 360-4.14 MG/200ML-% IV SOLN
60.0000 mg/h | INTRAVENOUS | Status: AC
  Administered 2014-09-01: 60 mg/h via INTRAVENOUS

## 2014-09-01 MED ORDER — SODIUM CHLORIDE 0.9 % IV SOLN: 250.0000 mL | INTRAVENOUS | Status: DC | PRN

## 2014-09-01 MED ORDER — OXYCODONE-ACETAMINOPHEN 5-325 MG PO TABS
2.0000 | ORAL_TABLET | Freq: Once | ORAL | Status: AC
Start: 2014-09-01 — End: 2014-09-01
  Administered 2014-09-01: 2 via ORAL
  Filled 2014-09-01: qty 2

## 2014-09-01 MED ORDER — NICOTINE 21 MG/24HR TD PT24
21.0000 mg | MEDICATED_PATCH | Freq: Every day | TRANSDERMAL | Status: DC
  Administered 2014-09-01 – 2014-09-02 (×2): 21 mg via TRANSDERMAL
  Filled 2014-09-01 (×3): qty 1

## 2014-09-01 MED ORDER — SODIUM CHLORIDE 0.9 % IJ SOLN: 3.0000 mL | INTRAMUSCULAR | Status: DC | PRN

## 2014-09-01 MED ORDER — LISINOPRIL 2.5 MG PO TABS
2.5000 mg | ORAL_TABLET | Freq: Every day | ORAL | Status: DC
  Administered 2014-09-01 – 2014-09-06 (×6): 2.5 mg via ORAL
  Filled 2014-09-01 (×6): qty 1

## 2014-09-01 MED ORDER — RANOLAZINE ER 500 MG PO TB12
500.0000 mg | ORAL_TABLET | Freq: Every day | ORAL | Status: DC
  Administered 2014-09-02 – 2014-09-06 (×5): 500 mg via ORAL
  Filled 2014-09-01 (×5): qty 1

## 2014-09-01 MED ORDER — LIDOCAINE HCL (CARDIAC) 20 MG/ML IV SOLN
1.0000 mg/kg | Freq: Once | INTRAVENOUS | Status: AC
  Administered 2014-09-01: 120 mg via INTRAVENOUS

## 2014-09-01 MED ORDER — SODIUM CHLORIDE 0.9 % IJ SOLN
3.0000 mL | Freq: Two times a day (BID) | INTRAMUSCULAR | Status: DC
  Administered 2014-09-01 – 2014-09-02 (×2): 3 mL via INTRAVENOUS

## 2014-09-01 MED ORDER — GABAPENTIN 100 MG PO CAPS
100.0000 mg | ORAL_CAPSULE | Freq: Three times a day (TID) | ORAL | Status: DC
  Administered 2014-09-01 – 2014-09-06 (×16): 100 mg via ORAL
  Filled 2014-09-01 (×18): qty 1

## 2014-09-01 MED ORDER — ONDANSETRON HCL 4 MG/2ML IJ SOLN: 4.0000 mg | Freq: Four times a day (QID) | INTRAMUSCULAR | Status: DC | PRN

## 2014-09-01 MED ORDER — LIDOCAINE IN D5W 4-5 MG/ML-% IV SOLN
2.0000 mg/min | INTRAVENOUS | Status: DC
  Administered 2014-09-01 – 2014-09-02 (×3): 2 mg/min via INTRAVENOUS
  Filled 2014-09-01 (×3): qty 500

## 2014-09-01 MED ORDER — ASPIRIN 81 MG PO CHEW
81.0000 mg | CHEWABLE_TABLET | ORAL | Status: AC
  Administered 2014-09-02: 81 mg via ORAL
  Filled 2014-09-01: qty 1

## 2014-09-01 MED ORDER — ACETAMINOPHEN 325 MG PO TABS
650.0000 mg | ORAL_TABLET | ORAL | Status: DC | PRN
  Administered 2014-09-01 – 2014-09-02 (×2): 650 mg via ORAL
  Filled 2014-09-01 (×2): qty 2

## 2014-09-01 MED ORDER — CYCLOBENZAPRINE HCL 10 MG PO TABS
10.0000 mg | ORAL_TABLET | Freq: Three times a day (TID) | ORAL | Status: DC
  Administered 2014-09-01: 10 mg via ORAL
  Filled 2014-09-01: qty 1

## 2014-09-01 MED ORDER — OLANZAPINE 10 MG PO TABS
10.0000 mg | ORAL_TABLET | Freq: Every day | ORAL | Status: DC
  Administered 2014-09-01 – 2014-09-05 (×5): 10 mg via ORAL
  Filled 2014-09-01 (×9): qty 1

## 2014-09-01 MED ORDER — AMIODARONE IV BOLUS ONLY 150 MG/100ML
150.0000 mg | Freq: Once | INTRAVENOUS | Status: AC
  Administered 2014-09-01: 150 mg via INTRAVENOUS

## 2014-09-01 MED ORDER — CYCLOBENZAPRINE HCL 10 MG PO TABS
10.0000 mg | ORAL_TABLET | Freq: Once | ORAL | Status: AC
  Administered 2014-09-01: 10 mg via ORAL
  Filled 2014-09-01: qty 1

## 2014-09-01 MED ORDER — TIZANIDINE HCL 2 MG PO TABS
2.0000 mg | ORAL_TABLET | Freq: Three times a day (TID) | ORAL | Status: DC | PRN
  Administered 2014-09-01 – 2014-09-05 (×7): 2 mg via ORAL
  Filled 2014-09-01 (×11): qty 1

## 2014-09-01 NOTE — Consult Note (Signed)
 Reason for Consult:syncope and NSVT  Referring Physician: Dr. Tilley  Jeffrey [name] is an 58 y.o. male.   HPI: the patient is a 58 yo man with an extensive past medical history as noted below. He sustained an anterior MI 6 weeks ago and was treated at [facility]. We do not have all of the records but sounds like he had an occluded LAD which was not intervened upon. His EF was 30-35%. He was in his usual state of health when he presented last night with an episode where he stated it felt like he was about to have another heart attack or worse. On tele he has had NS PMVT which is particularly concerning and risky for sudden death. He denies frank syncope. He stated that his episode last night was notable for almost passing out. No anginal symptoms.  PMH: Past Medical History  Diagnosis Date  . Necrotizing fasciitis   . Hypertension   . Chronic pain   . Parkinson disease   . Mental disorder   . Depression   . High cholesterol   . Myocardial infarction acute 07/16/2014  . Anginal pain   . Hypothyroidism   . Hepatitis C   . Headache     "weekly" (08/06/2014)  . DDD (degenerative disc disease)   . Degenerative disc disease   . Degenerative disc disease   . DDD (degenerative disc disease)   . DDD (degenerative disc disease)   . DDD (degenerative disc disease), lumbar   . Arthritis     "toes, knees, hips, back" (08/06/2014)  . Chronic lower back pain   . Anxiety   . Alcoholism     PSHX: Past Surgical History  Procedure Laterality Date  . Hip surgery Left 04/2009    Necrotizing Fasciitis  . Appendectomy  ~ 2008  . Ankle surgery Left 1977    "tendon repair"  . Incision and drainage of wound N/A 11/26/2012    Procedure: IRRIGATION AND DEBRIDEMENT WOUND;  Surgeon: Matthew Wakefield, MD;  Location: [facility] OR;  Service: General;  Laterality: N/A;  . Wound exploration N/A 11/26/2012    Procedure: WOUND EXPLORATION;  Surgeon: Matthew Wakefield, MD;  Location: [facility] OR;  Service: General;   Laterality: N/A;    FAMHX:History reviewed. No pertinent family history. No premature CAD  Social History:  reports that he has been smoking Cigarettes.  He has a 49 pack-year smoking history. His smokeless tobacco use includes Chew. He reports that he drinks about 1.5 oz of alcohol per week. He reports that he uses illicit drugs (Marijuana, Cocaine, and Other-see comments).  Allergies: No Known Allergies  Medications: reviewed  Dg Chest 2 View  09/01/2014   CLINICAL DATA:  Acute onset of generalized chest pain and heaviness. Shortness of breath. Initial encounter.  EXAM: CHEST  2 VIEW  COMPARISON:  Chest radiograph performed 08/06/2014  FINDINGS: The lungs are well-aerated. Mild left perihilar airspace opacity could reflect a mild infectious process. Mild vascular congestion is seen. Mild right basilar atelectasis is noted. There is no evidence of pleural effusion or pneumothorax.  The heart is normal in size; the mediastinal contour is within normal limits. No acute osseous abnormalities are seen.  IMPRESSION: Mild left perihilar airspace opacity could reflect a mild infectious process. Mild vascular congestion seen. Mild right basilar atelectasis noted.   Electronically Signed   By: Jeffery  Chang M.D.   On: 09/01/2014 00:25    ROS  As stated in the HPI and negative for all other systems.    Physical Exam  Vitals:Blood pressure 110/78, pulse 87, temperature 97.9 F (36.6 C), temperature source Oral, resp. rate 18, height 6\' 4"  (1.93 m), weight 260 lb (117.935 kg), SpO2 98 %.  anxious appearing NAD HEENT: Unremarkable Neck:  No JVD, no thyromegally Lymphatics:  No adenopathy Back:  No CVA tenderness Lungs:  Clear with no wheezes HEART:  Regular rate rhythm, no murmurs, no rubs, no clicks Abd:  soft, positive bowel sounds, no organomegally, no rebound, no guarding Ext:  2 plus pulses, no edema, no cyanosis, no clubbing, left leg with old scar on lateral aspect of lower leg and a large  scar with loss of tissue over left buttock Skin:  No rashes no nodules Neuro:  CN II through XII intact, motor grossly intact  ECG - nsr  Tele - nsr with NSVT Assessment/Plan:  1. Near syncope 2. Non-sustained PMVT 3. ICM, s/p MI 6 weeks ago 4. H/o necrotizing fascitis Rec: will plan left heart catherization. Additional recs will depend on result of heart cath. I suspect he will need ICD. Prognosis is guarded.  Sharlot GowdaGregg TaylorMD 09/01/2014, 11:48 AM   Addendum  Called because patient in VT at 250/min. He was awake but very symptomatic. VT was polymorphic. As I was placed pads to shock, he broke. Will start IV amio, and transfer to unit. He has not had evidence of an acute MI based on Troponin so I do not think we need to urgently intervene. He VT lasted overt a minute and was sustained.  [name],M.D.

## 2014-09-01 NOTE — ED Notes (Signed)
Pt resting quietly at the time. Vital signs stable. Lab at bedside. No signs of acute distress noted at present. Pt to be admitted.

## 2014-09-01 NOTE — ED Notes (Signed)
Attempted to call report x 1  

## 2014-09-01 NOTE — Plan of Care (Signed)
Problem: Phase I Progression Outcomes Goal: Arrhythmia controlled or corrected Outcome: Progressing Patient on amio and lido

## 2014-09-01 NOTE — ED Notes (Signed)
Pt c/o pain in "knees, back, all over"

## 2014-09-01 NOTE — H&P (Signed)
History and Physical  Jeffrey [name] ZOX:[id number] DOB: December 16, 1955 DOA: 08/31/2014  Referring physician: Teodoro SprayScott Golden, ER physician PCP: Jeanann LewandowskyJEGEDE, OLUGBEMIGA, MD   Chief Complaint: Chest pain  HPI: Jeffrey [name] is a 58 y.o. male  With past medical history of depression, hypertension, homelessness and chronic pain and recent discharge last month [facility] where it looked as if he had multivessel disease that was going to be treated medically who came to the emergency room complaining of severe chest pain. Patient has had a few ER visits in the last few weeks complaining of pain in different areas. He stated that he is staying at a homeless shelter and the night prior experienced a very sudden onset light headed spell that led to some substernal left-sided chest pain. He came into the emergency room and lab work was unrevealing and EKG noted some ST elevations in lead V3, but otherwise unrevealing. Hospitalists were called for further evaluation and admission. Patient sent to the floor.  Addendum: Following arrival, patient initially somnolent when he was examined. Less than an hour later, he was more awake and complaining of spasms and his chronic lower extremity pain. He had a very brief run of ventricular tachycardia about 15 beats. Proximally one hour later, patient suddenly went into sustained ventricular tachycardia. Cardiology who happened to be on the floor called a code. Prior to patient being defibrillated, he converted back to sinus rhythm on his own. Patient started on amiodarone drip and transferred to ICU with plans for left heart cath tomorrow   Review of Systems:  Patient seen on floor prior to events described above. Pt complains of fatigue, some chest pain and lower extremity pain.  Pt denies any shortness of breath, headache, vision changes, wheeze, cough, abdominal pain, hematuria, dysuria, constipation, diarrhea, focal extremity weakness. Denies any nausea  vomiting.  Review of systems are otherwise negative  Past Medical History  Diagnosis Date  . Necrotizing fasciitis   . Hypertension   . Chronic pain   . Parkinson disease   . Mental disorder   . Depression   . High cholesterol   . Myocardial infarction acute 07/16/2014  . Anginal pain   . Hypothyroidism   . Hepatitis C   . Headache     "weekly" (08/06/2014)  . DDD (degenerative disc disease)   . Degenerative disc disease   . Degenerative disc disease   . DDD (degenerative disc disease)   . DDD (degenerative disc disease)   . DDD (degenerative disc disease), lumbar   . Arthritis     "toes, knees, hips, back" (08/06/2014)  . Chronic lower back pain   . Anxiety   . Alcoholism    Past Surgical History  Procedure Laterality Date  . Hip surgery Left 04/2009    Necrotizing Fasciitis  . Appendectomy  ~ 2008  . Ankle surgery Left 1977    "tendon repair"  . Incision and drainage of wound N/A 11/26/2012    Procedure: IRRIGATION AND DEBRIDEMENT WOUND;  Surgeon: Emelia LoronMatthew Wakefield, MD;  [facility];  Service: General;  Laterality: N/A;  . Wound exploration N/A 11/26/2012    Procedure: WOUND EXPLORATION;  Surgeon: Emelia LoronMatthew Wakefield, MD;  [facility];  Service: General;  Laterality: N/A;   Social History:  reports that he has been smoking Cigarettes.  He has a 49 pack-year smoking history. His smokeless tobacco use includes Chew. He reports that he drinks about 1.5 oz of alcohol per week. He reports that he uses illicit  drugs (Marijuana, Cocaine, and Other-see comments). Patient lives at at a homeless shelter & is able to participate in activities of daily living with use of a wheelchair  No Known Allergies  Family history: Patient states he doesn't know what medical problems run in his family  Prior to Admission medications   Medication Sig Start Date End Date Taking? Authorizing Provider  fentaNYL (DURAGESIC - DOSED MCG/HR) 25 MCG/HR patch Place 1 patch (25 mcg total) onto the  skin every 3 (three) days. 08/09/14  Yes Tora KindredMarianne L York, PA-C  ranolazine (RANEXA) 500 MG 12 hr tablet Take 1 tablet (500 mg total) by mouth daily. 08/09/14  Yes Marianne L York, PA-C  acetaminophen-codeine (TYLENOL #3) 300-30 MG per tablet Take 1 tablet by mouth every 8 (eight) hours as needed. Patient not taking: Reported on 08/22/2014 08/13/14   Quentin Angstlugbemiga E Jegede, MD  aspirin EC 81 MG EC tablet Take 1 tablet (81 mg total) by mouth daily. Patient not taking: Reported on 08/22/2014 08/09/14   Stephani PoliceMarianne L York, PA-C  atorvastatin (LIPITOR) 80 MG tablet Take 1 tablet (80 mg total) by mouth daily. Patient not taking: Reported on 09/01/2014 08/09/14   Stephani PoliceMarianne L York, PA-C  ciprofloxacin (CIPRO) 500 MG tablet Take 1 tablet (500 mg total) by mouth 2 (two) times daily. Patient not taking: Reported on 08/22/2014 08/09/14   Stephani PoliceMarianne L York, PA-C  cyclobenzaprine (FLEXERIL) 10 MG tablet Take 1 tablet (10 mg total) by mouth 3 (three) times daily. Patient not taking: Reported on 09/01/2014 08/09/14   Stephani PoliceMarianne L York, PA-C  DULoxetine (CYMBALTA) 60 MG capsule Take 1 capsule (60 mg total) by mouth daily. For depression/pain control Patient not taking: Reported on 09/01/2014 08/09/14   Stephani PoliceMarianne L York, PA-C  lisinopril (PRINIVIL,ZESTRIL) 2.5 MG tablet Take 2.5 mg by mouth daily.    Historical Provider, MD  Multiple Vitamin (MULTIVITAMIN WITH MINERALS) TABS tablet Take 1 tablet by mouth daily. Patient not taking: Reported on 08/22/2014 08/09/14   Stephani PoliceMarianne L York, PA-C  nicotine (NICODERM CQ - DOSED IN MG/24 HOURS) 21 mg/24hr patch Place 1 patch (21 mg total) onto the skin at bedtime. Patient not taking: Reported on 08/13/2014 08/09/14   Tora KindredMarianne L York, PA-C  OLANZapine (ZYPREXA) 10 MG tablet Take 1 tablet (10 mg total) by mouth at bedtime. Patient not taking: Reported on 09/01/2014 08/09/14   Stephani PoliceMarianne L York, PA-C  oxyCODONE-acetaminophen (PERCOCET) 5-325 MG per tablet Take 1 tablet by mouth every 4 (four) hours as  needed. Patient not taking: Reported on 09/01/2014 08/09/14   Stephani PoliceMarianne L York, PA-C    Physical Exam: BP 118/96 mmHg  Pulse 89  Temp(Src) 98 F (36.7 C) (Oral)  Resp 13  Ht 6\' 4"  (1.93 m)  Wt 120 kg (264 lb 8.8 oz)  BMI 32.22 kg/m2  SpO2 100%  General:  Alert and oriented 3, no acute distress, somewhat somnolent Eyes: Sclera nonicteric, extraocular movements are intact ENT: Normocephalic, atraumatic, mucous membranes are slightly dry Neck: No JVD Cardiovascular: Regular rate and rhythm, S1-S2, 2/6 systolic ejection murmur Respiratory: Clear to auscultation bilaterally Abdomen: Soft, nontender, nondistended, positive bowel sounds Skin: Patient has a variety of wounds and ulcers on his lower extremities that are for the most part healing to some degree Musculoskeletal: No clubbing or cyanosis, trace pitting edema Psychiatric: Patient is somewhat somnolent, but otherwise no evidence of acute psychoses Neurologic: No obvious deficits           Labs on Admission:  Basic Metabolic Panel:  Recent Labs Lab 08/31/14 2321 09/01/14 1227  NA 139  --   K 3.5  --   CL 107  --   CO2 22  --   GLUCOSE 116*  --   BUN 18  --   CREATININE 0.83 0.75  CALCIUM 8.8  --    Liver Function Tests: No results for input(s): AST, ALT, ALKPHOS, BILITOT, PROT, ALBUMIN in the last 168 hours. No results for input(s): LIPASE, AMYLASE in the last 168 hours. No results for input(s): AMMONIA in the last 168 hours. CBC:  Recent Labs Lab 08/31/14 2321 09/01/14 1227  WBC 8.9 9.3  HGB 12.8* 14.2  HCT 39.3 43.0  MCV 85.4 85.3  PLT 275 321   Cardiac Enzymes:  Recent Labs Lab 09/01/14 0723 09/01/14 1227  TROPONINI 0.04* 0.04*    BNP (last 3 results) No results for input(s): PROBNP in the last 8760 hours. CBG: No results for input(s): GLUCAP in the last 168 hours.  Radiological Exams on Admission: Dg Chest 2 View  09/01/2014   CLINICAL DATA:  Acute onset of generalized chest pain and  heaviness. Shortness of breath. Initial encounter.  EXAM: CHEST  2 VIEW  COMPARISON:  Chest radiograph performed 08/06/2014  FINDINGS: The lungs are well-aerated. Mild left perihilar airspace opacity could reflect a mild infectious process. Mild vascular congestion is seen. Mild right basilar atelectasis is noted. There is no evidence of pleural effusion or pneumothorax.  The heart is normal in size; the mediastinal contour is within normal limits. No acute osseous abnormalities are seen.  IMPRESSION: Mild left perihilar airspace opacity could reflect a mild infectious process. Mild vascular congestion seen. Mild right basilar atelectasis noted.   Electronically Signed   By: Roanna Raider M.D.   On: 09/01/2014 00:25    EKG: Independently reviewed. Incomplete right bundle branch block, left anterior fascicular block and ST elevations noted in V3  Assessment/Plan Present on Admission:  . Chest pain: Cycle enzymes, asked cardiology to see, repeat EKG  . Essential hypertension: Continue beta blocker, ACE inhibitor  . Hyperlipidemia . Hypothyroidism: TSH normal. Patient with reported history although not on Synthroid. Likely mistaken diagnosis and will discontinue this.  . CAD, multiple vessel . Chronic combined systolic and diastolic congestive heart failure: Awaiting BNP  . Chronic paraplegia: Patient able to move his lower from 80s to some degree.  . Obesity (BMI 30-39.9): Patient is criteria BMI greater than 30  Ventricular tachycardia: Updated. Given run, patient started on IV amiodarone and plans for left heart cath. Patient transferred to ICU.  Mortality risk increased.  Consultants: Cardiology  Code Status: Full code  Family Communication: No family   Disposition Plan: Continue in ICU  Time spent: 45 minutes  Hollice Espy [facility] Pager [phone]

## 2014-09-01 NOTE — Progress Notes (Signed)
Around 1205p, nurse noted on tele screen pt HR 263. MD was notified, crash cart was obtained. Once in the room Pt HR broke back to Sinus Rhythm. 150 mg IV bolus ordered, and pads were placed. MD was at bedside, amiodarone infusion started, IV team started a new IV. Pt transferred to 2 Heart. Cecille Rubinhompson,Gaynelle Pastrana V, RN

## 2014-09-01 NOTE — Consult Note (Signed)
Referring Physician: No referring provider defined for this encounter. Primary Physician: No PCP Per Patient Primary Cardiologist: unknown Reason for Consultation: chest pain, EKG changes, pre-syncope  HPI: This is 58 year old Caucasian male with past medical history of anteroapical myocardial infarction in November 2015, according to [facility] and his heart catheterization at that time showed occluded LAD with faint collaterals. Patient is a resident of homeless shelter, has multiple psychiatric issues as well as chronic pain issues (scheduled to see pain clinic). Last time patient was admitted [facility] on 08/06/2014 when he presented with suicidal ideation and suicide attempt via overdose of his medications. At that time he was noted to have elevated troponin up to 1.5. However after review of his outside records and overall story patient's care  was expeditiously transferred to the hospitalist group and psychiatry. Patient apparently presented after he had an episode of chest pain and presyncope. Patient reported that he went to the bathroom to urinate and when he was coming back upon transferring from the wheelchair to the back she experienced severe central chest pain that he was not able to describe however he stated that it lasted for 30 seconds and was associated with sensation that she is about to faint, patient reported seeing flashes of light. He denied palpitations however I need to mention that she is probably not the best historian and he told the emergency room physician that his chest pain lasted for about an hour. Prior to this episode of chest pain patient was at his baseline state of health  Pt denied lower extremity edema, PND or orthopnea, frequent or prolonged palpitations, fever, chills, dysuria, diarrhea, constipation, bleeding  Review of Systems:  12 systems were reviewed nad were negative except mentioned in the HPI      Past Medical  History  Diagnosis Date  . Necrotizing fasciitis   . Hypertension   . Chronic pain   . Parkinson disease   . Mental disorder   . Depression   . High cholesterol   . Myocardial infarction acute 07/16/2014  . Anginal pain   . Hypothyroidism   . Hepatitis C   . Headache     "weekly" (08/06/2014)  . DDD (degenerative disc disease)   . Degenerative disc disease   . Degenerative disc disease   . DDD (degenerative disc disease)   . DDD (degenerative disc disease)   . DDD (degenerative disc disease), lumbar   . Arthritis     "toes, knees, hips, back" (08/06/2014)  . Chronic lower back pain   . Anxiety   . Alcoholism    Past Surgical History  Procedure Laterality Date  . Hip surgery Left 04/2009    Necrotizing Fasciitis  . Appendectomy  ~ 2008  . Ankle surgery Left 1977    "tendon repair"  . Incision and drainage of wound N/A 11/26/2012    Procedure: IRRIGATION AND DEBRIDEMENT WOUND;  Surgeon: Emelia LoronMatthew Wakefield, MD;  [facility];  Service: General;  Laterality: N/A;  . Wound exploration N/A 11/26/2012    Procedure: WOUND EXPLORATION;  Surgeon: Emelia LoronMatthew Wakefield, MD;  [facility];  Service: General;  Laterality: N/A;     Current Medications:  Infusions:    (Not in a hospital admission)   No Known Allergies  History   Social History  . Marital Status: Single    Spouse Name: N/A    Number of Children: N/A  . Years of Education: N/A   Occupational History  .  Not on file.   Social History Main Topics  . Smoking status: Current Some Day Smoker -- 1.00 packs/day for 49 years    Types: Cigarettes  . Smokeless tobacco: Current User    Types: Chew  . Alcohol Use: 1.5 oz/week     Comment: 08/06/2014 "I'm in facilities where you can't drink"  . Drug Use: Yes    Special: Marijuana, Cocaine, Other-see comments     Comment: 08/06/2014 "never was a big user of any of it; right place at wrong time I'll use; I don't seek drugs"  . Sexual Activity:    Partners: Female    Other Topics Concern  . Not on file   Social History Narrative   ** Merged History Encounter **        History reviewed. No pertinent family history. No family status information on file.    PHYSICAL EXAM: Filed Vitals:   09/01/14 0630  BP: 104/70  Pulse: 85  Resp: 11     Intake/Output Summary (Last 24 hours) at 09/01/14 0708 Last data filed at 09/01/14 0220  Gross per 24 hour  Intake    250 ml  Output      0 ml  Net    250 ml    General:  Well appearing. No respiratory difficulty HEENT: normal Neck: supple. no JVD. Carotids 2+ bilat; no bruits. No lymphadenopathy or thryomegaly appreciated. Cor:  Regular rate & rhythm. No rubs, gallops or murmurs. Lungs: clear Abdomen: soft, nontender, nondistended. No hepatosplenomegaly. No bruits or masses. Good bowel sounds. Extremities: no cyanosis, clubbing, rash, edema Neuro: alert & oriented x 3, cranial nerves grossly intact. moves all 4 extremities w/o difficulty. Affect pleasant.  Results for orders placed or performed during the hospital encounter of 08/31/14 (from the past 24 hour(s))  CBC     Status: Abnormal   Collection Time: 08/31/14 11:21 PM  Result Value Ref Range   WBC 8.9 4.0 - 10.5 K/uL   RBC 4.60 4.22 - 5.81 MIL/uL   Hemoglobin 12.8 (L) 13.0 - 17.0 g/dL   HCT 16.1 09.6 - 04.5 %   MCV 85.4 78.0 - 100.0 fL   MCH 27.8 26.0 - 34.0 pg   MCHC 32.6 30.0 - 36.0 g/dL   RDW 40.9 81.1 - 91.4 %   Platelets 275 150 - 400 K/uL  Basic metabolic panel     Status: Abnormal   Collection Time: 08/31/14 11:21 PM  Result Value Ref Range   Sodium 139 135 - 145 mmol/L   Potassium 3.5 3.5 - 5.1 mmol/L   Chloride 107 96 - 112 mEq/L   CO2 22 19 - 32 mmol/L   Glucose, Bld 116 (H) 70 - 99 mg/dL   BUN 18 6 - 23 mg/dL   Creatinine, Ser 7.82 0.50 - 1.35 mg/dL   Calcium 8.8 8.4 - 95.6 mg/dL   GFR calc non Af Amer >90 >90 mL/min   GFR calc Af Amer >90 >90 mL/min   Anion gap 10 5 - 15  I-stat troponin, ED (not at [facility])      Status: None   Collection Time: 08/31/14 11:33 PM  Result Value Ref Range   Troponin i, poc 0.04 0.00 - 0.08 ng/mL   Comment 3           Radiology:  Dg Chest 2 View  09/01/2014   CLINICAL DATA:  Acute onset of generalized chest pain and heaviness. Shortness of breath. Initial encounter.  EXAM: CHEST  2 VIEW  COMPARISON:  Chest radiograph performed 08/06/2014  FINDINGS: The lungs are well-aerated. Mild left perihilar airspace opacity could reflect a mild infectious process. Mild vascular congestion is seen. Mild right basilar atelectasis is noted. There is no evidence of pleural effusion or pneumothorax.  The heart is normal in size; the mediastinal contour is within normal limits. No acute osseous abnormalities are seen.  IMPRESSION: Mild left perihilar airspace opacity could reflect a mild infectious process. Mild vascular congestion seen. Mild right basilar atelectasis noted.   Electronically Signed   By: Roanna RaiderJeffery  Chang M.D.   On: 09/01/2014 00:25    ECG: Sinus Rhythm 99 bpm, LAD, 2 mm ST elevation in V3, 0.5-1 mm ST elevation in V4. QS in V1-V4; TWI in V2-V3 - this is however not significantly changed from prior EKGs  ECHO (prior report reviewed). My own bedside ECHOcardiogram showed: severe LV dysfunction with LVEF of 30%; anteroapical DYSKINESIS, IVC is normal size and collapses with respiration  ASSESSMENT: Chest pain Presyncope CAD, hx of anteroapical MI Depression/hx of suicidal ideation Homeless Chronic Pain Hep C  Discussion and recommendations: Patient presented with an episode of atypical chest pain that lasted for 30 seconds. 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 His episode of presyncope could have been vasovagal secondary to pain however given history of occluded LAD and severe LV dysfunction it would be reasonable to observe him on telemetry to rule out significant arrhythmias with this being said he did  not have any reported arrhythmias on telemetry during his ER stay.  I would recommend obtaining an second set of troponin, clarify patient's home medications. He appears to be euvolemic on physical and ultrasound examination therefore continuation of his chronic medication is reasonable. May consider up titration of his heart failure meds. He does not appear to need additional diuresis. Given multiple psychosocial issues and chronic pain I would ask hospitalist service to admit the patient with cardiology service as a consultants, would not expect prolonged hospital stay for the patient  Nolon NationsLISHMANOV, Alegria Dominique, MD 09/01/2014 7:08 AM

## 2014-09-01 NOTE — ED Notes (Addendum)
Patient having spasms of pain, requesting muscle relaxants.  MD notified, new orders per Dr Criss AlvineGoldston.

## 2014-09-01 NOTE — Progress Notes (Signed)
Pt had severe muscle spasms, 15 beat run of vtach to follow. MD notified. Cecille Rubinhompson,Terry Bolotin V, RN

## 2014-09-01 NOTE — Progress Notes (Signed)
Chaplain responded to Code Blue...resolved before she arrived.  Will follow after pt. Moves to 2H.  Rev. Audie BoxJan Hill [phone]

## 2014-09-02 ENCOUNTER — Encounter (HOSPITAL_COMMUNITY): Payer: Self-pay | Admitting: *Deleted

## 2014-09-02 ENCOUNTER — Encounter (HOSPITAL_COMMUNITY)
Admission: EM | Disposition: A | Discharge status: Home / Self Care | Payer: Self-pay | Source: Home / Self Care | Attending: Cardiovascular Disease

## 2014-09-02 DIAGNOSIS — I472 Ventricular tachycardia, unspecified: Secondary | ICD-10-CM

## 2014-09-02 DIAGNOSIS — F1290 Cannabis use, unspecified, uncomplicated: Secondary | ICD-10-CM | POA: Diagnosis present

## 2014-09-02 DIAGNOSIS — F17220 Nicotine dependence, chewing tobacco, uncomplicated: Secondary | ICD-10-CM | POA: Diagnosis present

## 2014-09-02 DIAGNOSIS — G8929 Other chronic pain: Secondary | ICD-10-CM | POA: Diagnosis present

## 2014-09-02 DIAGNOSIS — G20 Parkinson's disease: Secondary | ICD-10-CM | POA: Diagnosis present

## 2014-09-02 DIAGNOSIS — E785 Hyperlipidemia, unspecified: Secondary | ICD-10-CM | POA: Diagnosis present

## 2014-09-02 DIAGNOSIS — I5042 Chronic combined systolic (congestive) and diastolic (congestive) heart failure: Secondary | ICD-10-CM | POA: Diagnosis present

## 2014-09-02 DIAGNOSIS — Z993 Dependence on wheelchair: Secondary | ICD-10-CM | POA: Diagnosis not present

## 2014-09-02 DIAGNOSIS — I10 Essential (primary) hypertension: Secondary | ICD-10-CM | POA: Diagnosis present

## 2014-09-02 DIAGNOSIS — Z6832 Body mass index (BMI) 32.0-32.9, adult: Secondary | ICD-10-CM | POA: Diagnosis not present

## 2014-09-02 DIAGNOSIS — R079 Chest pain, unspecified: Secondary | ICD-10-CM | POA: Diagnosis present

## 2014-09-02 DIAGNOSIS — Z590 Homelessness: Secondary | ICD-10-CM | POA: Diagnosis not present

## 2014-09-02 DIAGNOSIS — I2510 Atherosclerotic heart disease of native coronary artery without angina pectoris: Secondary | ICD-10-CM | POA: Diagnosis present

## 2014-09-02 DIAGNOSIS — G8220 Paraplegia, unspecified: Secondary | ICD-10-CM | POA: Diagnosis present

## 2014-09-02 DIAGNOSIS — I255 Ischemic cardiomyopathy: Secondary | ICD-10-CM | POA: Diagnosis present

## 2014-09-02 DIAGNOSIS — E669 Obesity, unspecified: Secondary | ICD-10-CM | POA: Diagnosis present

## 2014-09-02 DIAGNOSIS — F1490 Cocaine use, unspecified, uncomplicated: Secondary | ICD-10-CM | POA: Diagnosis present

## 2014-09-02 DIAGNOSIS — F17210 Nicotine dependence, cigarettes, uncomplicated: Secondary | ICD-10-CM | POA: Diagnosis present

## 2014-09-02 DIAGNOSIS — E039 Hypothyroidism, unspecified: Secondary | ICD-10-CM | POA: Diagnosis present

## 2014-09-02 DIAGNOSIS — M5136 Other intervertebral disc degeneration, lumbar region: Secondary | ICD-10-CM | POA: Diagnosis present

## 2014-09-02 DIAGNOSIS — I252 Old myocardial infarction: Secondary | ICD-10-CM | POA: Diagnosis not present

## 2014-09-02 DIAGNOSIS — F319 Bipolar disorder, unspecified: Secondary | ICD-10-CM | POA: Diagnosis present

## 2014-09-02 DIAGNOSIS — Z7982 Long term (current) use of aspirin: Secondary | ICD-10-CM | POA: Diagnosis not present

## 2014-09-02 DIAGNOSIS — M545 Low back pain: Secondary | ICD-10-CM | POA: Diagnosis present

## 2014-09-02 DIAGNOSIS — F419 Anxiety disorder, unspecified: Secondary | ICD-10-CM | POA: Diagnosis present

## 2014-09-02 HISTORY — PX: LEFT HEART CATHETERIZATION WITH CORONARY ANGIOGRAM: SHX5451

## 2014-09-02 LAB — CBC
HCT: 39.5 % (ref 39.0–52.0)
Hemoglobin: 12.8 g/dL — ABNORMAL LOW (ref 13.0–17.0)
MCH: 27.7 pg (ref 26.0–34.0)
MCHC: 32.4 g/dL (ref 30.0–36.0)
MCV: 85.5 fL (ref 78.0–100.0)
PLATELETS: 267 10*3/uL (ref 150–400)
RBC: 4.62 MIL/uL (ref 4.22–5.81)
RDW: 15 % (ref 11.5–15.5)
WBC: 8.1 10*3/uL (ref 4.0–10.5)

## 2014-09-02 LAB — APTT: aPTT: 35 seconds (ref 24–37)

## 2014-09-02 LAB — COMPREHENSIVE METABOLIC PANEL
ALT: 11 U/L (ref 0–53)
AST: 16 U/L (ref 0–37)
Albumin: 3.1 g/dL — ABNORMAL LOW (ref 3.5–5.2)
Alkaline Phosphatase: 60 U/L (ref 39–117)
Anion gap: 11 (ref 5–15)
BUN: 15 mg/dL (ref 6–23)
CO2: 21 mmol/L (ref 19–32)
Calcium: 8.3 mg/dL — ABNORMAL LOW (ref 8.4–10.5)
Chloride: 107 mEq/L (ref 96–112)
Creatinine, Ser: 0.74 mg/dL (ref 0.50–1.35)
GFR calc Af Amer: >90 mL/min (ref >90)
Glucose, Bld: 92 mg/dL (ref 70–99)
Potassium: 3.9 mmol/L (ref 3.5–5.1)
SODIUM: 139 mmol/L (ref 135–145)
Total Bilirubin: 0.3 mg/dL (ref 0.3–1.2)
Total Protein: 6.1 g/dL (ref 6.0–8.3)

## 2014-09-02 LAB — PROTIME-INR
INR: 1.09 (ref 0.00–1.49)
Prothrombin Time: 14.2 seconds (ref 11.6–15.2)

## 2014-09-02 LAB — TROPONIN I: Troponin I: 0.04 ng/mL — ABNORMAL HIGH (ref <0.031)

## 2014-09-02 SURGERY — LEFT HEART CATHETERIZATION WITH CORONARY ANGIOGRAM
Anesthesia: LOCAL

## 2014-09-02 MED ORDER — VERAPAMIL HCL 2.5 MG/ML IV SOLN
INTRAVENOUS | Status: AC
  Filled 2014-09-02: qty 2

## 2014-09-02 MED ORDER — HEPARIN (PORCINE) IN NACL 2-0.9 UNIT/ML-% IJ SOLN
INTRAMUSCULAR | Status: AC
  Filled 2014-09-02: qty 1000

## 2014-09-02 MED ORDER — FENTANYL CITRATE 0.05 MG/ML IJ SOLN
INTRAMUSCULAR | Status: AC
  Filled 2014-09-02: qty 2

## 2014-09-02 MED ORDER — HYDROMORPHONE HCL 1 MG/ML IJ SOLN
0.5000 mg | Freq: Once | INTRAMUSCULAR | Status: AC
  Administered 2014-09-02: 0.5 mg via INTRAVENOUS
  Filled 2014-09-02: qty 1

## 2014-09-02 MED ORDER — SODIUM CHLORIDE 0.9 % IV SOLN: INTRAVENOUS | Status: AC

## 2014-09-02 MED ORDER — MIDAZOLAM HCL 2 MG/2ML IJ SOLN
INTRAMUSCULAR | Status: AC
  Filled 2014-09-02: qty 2

## 2014-09-02 MED ORDER — HEPARIN SODIUM (PORCINE) 1000 UNIT/ML IJ SOLN
INTRAMUSCULAR | Status: AC
  Filled 2014-09-02: qty 1

## 2014-09-02 MED ORDER — ACETAMINOPHEN 325 MG PO TABS
650.0000 mg | ORAL_TABLET | ORAL | Status: DC | PRN
  Administered 2014-09-02 – 2014-09-04 (×3): 650 mg via ORAL
  Filled 2014-09-02 (×3): qty 2

## 2014-09-02 MED ORDER — LIDOCAINE HCL (PF) 1 % IJ SOLN
INTRAMUSCULAR | Status: AC
  Filled 2014-09-02: qty 30

## 2014-09-02 MED ORDER — NITROGLYCERIN 1 MG/10 ML FOR IR/CATH LAB
INTRA_ARTERIAL | Status: AC
  Filled 2014-09-02: qty 10

## 2014-09-02 MED ORDER — NICOTINE 21 MG/24HR TD PT24
21.0000 mg | MEDICATED_PATCH | Freq: Every day | TRANSDERMAL | Status: DC
  Administered 2014-09-03 – 2014-09-05 (×3): 21 mg via TRANSDERMAL
  Filled 2014-09-02 (×3): qty 1

## 2014-09-02 MED ORDER — ASPIRIN 81 MG PO CHEW
81.0000 mg | CHEWABLE_TABLET | Freq: Every day | ORAL | Status: DC
  Administered 2014-09-03 – 2014-09-06 (×4): 81 mg via ORAL
  Filled 2014-09-02 (×4): qty 1

## 2014-09-02 MED ORDER — ONDANSETRON HCL 4 MG/2ML IJ SOLN
4.0000 mg | Freq: Four times a day (QID) | INTRAMUSCULAR | Status: DC | PRN
  Administered 2014-09-02: 4 mg via INTRAVENOUS
  Filled 2014-09-02: qty 2

## 2014-09-02 MED FILL — Medication: Qty: 1 | Status: AC

## 2014-09-02 NOTE — Progress Notes (Signed)
Patient having frequent nonsustained runs of SVT and VT with HR increasing to 110-120's. Pt is asleep on right side. Awakens easily and denies symptoms. BP 99/56. Dr. Allena [facility]Patel [facility] and [facility] answering service x1.

## 2014-09-02 NOTE — H&P (View-Only) (Signed)
Reason for Consult:syncope and NSVT  Referring Physician: Dr. Bradd Canaryilley  Jeffrey [name] is an 58 y.o. male.   HPI: the patient is a 58 yo man with an extensive past medical history as noted below. He sustained an anterior MI 6 weeks ago and [facility] We do not have all of the records but sounds like he had an occluded LAD which was not intervened upon. His EF was 30-35%. He was in his usual state of health when he presented last night with an episode where he stated it felt like he was about to have another heart attack or worse. On tele he has had NS PMVT which is particularly concerning and risky for sudden death. He denies frank syncope. He stated that his episode last night was notable for almost passing out. No anginal symptoms.  PMH: Past Medical History  Diagnosis Date  . Necrotizing fasciitis   . Hypertension   . Chronic pain   . Parkinson disease   . Mental disorder   . Depression   . High cholesterol   . Myocardial infarction acute 07/16/2014  . Anginal pain   . Hypothyroidism   . Hepatitis C   . Headache     "weekly" (08/06/2014)  . DDD (degenerative disc disease)   . Degenerative disc disease   . Degenerative disc disease   . DDD (degenerative disc disease)   . DDD (degenerative disc disease)   . DDD (degenerative disc disease), lumbar   . Arthritis     "toes, knees, hips, back" (08/06/2014)  . Chronic lower back pain   . Anxiety   . Alcoholism     PSHX: Past Surgical History  Procedure Laterality Date  . Hip surgery Left 04/2009    Necrotizing Fasciitis  . Appendectomy  ~ 2008  . Ankle surgery Left 1977    "tendon repair"  . Incision and drainage of wound N/A 11/26/2012    Procedure: IRRIGATION AND DEBRIDEMENT WOUND;  Surgeon: Emelia LoronMatthew Wakefield, MD;  [facility];  Service: General;  Laterality: N/A;  . Wound exploration N/A 11/26/2012    Procedure: WOUND EXPLORATION;  Surgeon: Emelia LoronMatthew Wakefield, MD;  [facility];  Service: General;   Laterality: N/A;    FAMHX:History reviewed. No pertinent family history. No premature CAD  Social History:  reports that he has been smoking Cigarettes.  He has a 49 pack-year smoking history. His smokeless tobacco use includes Chew. He reports that he drinks about 1.5 oz of alcohol per week. He reports that he uses illicit drugs (Marijuana, Cocaine, and Other-see comments).  Allergies: No Known Allergies  Medications: reviewed  Dg Chest 2 View  09/01/2014   CLINICAL DATA:  Acute onset of generalized chest pain and heaviness. Shortness of breath. Initial encounter.  EXAM: CHEST  2 VIEW  COMPARISON:  Chest radiograph performed 08/06/2014  FINDINGS: The lungs are well-aerated. Mild left perihilar airspace opacity could reflect a mild infectious process. Mild vascular congestion is seen. Mild right basilar atelectasis is noted. There is no evidence of pleural effusion or pneumothorax.  The heart is normal in size; the mediastinal contour is within normal limits. No acute osseous abnormalities are seen.  IMPRESSION: Mild left perihilar airspace opacity could reflect a mild infectious process. Mild vascular congestion seen. Mild right basilar atelectasis noted.   Electronically Signed   By: Roanna RaiderJeffery  Chang M.D.   On: 09/01/2014 00:25    ROS  As stated in the HPI and negative for all other systems.  Physical Exam  Vitals:Blood pressure 110/78, pulse 87, temperature 97.9 F (36.6 C), temperature source Oral, resp. rate 18, height 6\' 4"  (1.93 m), weight 260 lb (117.935 kg), SpO2 98 %.  anxious appearing NAD HEENT: Unremarkable Neck:  No JVD, no thyromegally Lymphatics:  No adenopathy Back:  No CVA tenderness Lungs:  Clear with no wheezes HEART:  Regular rate rhythm, no murmurs, no rubs, no clicks Abd:  soft, positive bowel sounds, no organomegally, no rebound, no guarding Ext:  2 plus pulses, no edema, no cyanosis, no clubbing, left leg with old scar on lateral aspect of lower leg and a large  scar with loss of tissue over left buttock Skin:  No rashes no nodules Neuro:  CN II through XII intact, motor grossly intact  ECG - nsr  Tele - nsr with NSVT Assessment/Plan:  1. Near syncope 2. Non-sustained PMVT 3. ICM, s/p MI 6 weeks ago 4. H/o necrotizing fascitis Rec: will plan left heart catherization. Additional recs will depend on result of heart cath. I suspect he will need ICD. Prognosis is guarded.  Sharlot GowdaGregg TaylorMD 09/01/2014, 11:48 AM   Addendum  Called because patient in VT at 250/min. He was awake but very symptomatic. VT was polymorphic. As I was placed pads to shock, he broke. Will start IV amio, and transfer to unit. He has not had evidence of an acute MI based on Troponin so I do not think we need to urgently intervene. He VT lasted overt a minute and was sustained.  Leonia ReevesGregg Taylor,M.D.

## 2014-09-02 NOTE — Interval H&P Note (Signed)
Cath Lab Visit (complete for each Cath Lab visit)  Clinical Evaluation Leading to the Procedure:   ACS: Yes.    Non-ACS:    Anginal Classification: CCS IV  Anti-ischemic medical therapy: Minimal Therapy (1 class of medications)  Non-Invasive Test Results: No non-invasive testing performed  Prior CABG: No previous CABG      History and Physical Interval Note:  09/02/2014 1:31 PM  Jeffrey [name]  has presented today for surgery, with the diagnosis of cp  The various methods of treatment have been discussed with the patient and family. After consideration of risks, benefits and other options for treatment, the patient has consented to  Procedure(s): LEFT HEART CATHETERIZATION WITH CORONARY ANGIOGRAM (N/A) as a surgical intervention .  The patient's history has been reviewed, patient examined, no change in status, stable for surgery.  I have reviewed the patient's chart and labs.  Questions were answered to the patient's satisfaction.     [name],Jeffrey [name]

## 2014-09-02 NOTE — CV Procedure (Signed)
Jeffrey [name] is a 58 y.o. male    [id number] LOCATION:  FACILITY: [facility]  PHYSICIAN: Nanetta BattyJonathan Jacayla Nordell, M.D. 1955-12-11   DATE OF PROCEDURE:  09/02/2014  DATE OF DISCHARGE:     CARDIAC CATHETERIZATION     History obtained from chart review.Mr. [name] is a 58 year old moderately overweight Caucasian male with multiple medical problems who was admitted on 08/31/14 with chest pain and polymorphic ventricular tachycardia. He apparently suffered an anterior wall myocardial infarctions several loose ago and was catheter[facility]. No intervention was apparently performed and he was discharged on medical therapy. He had polymorphic ventricular tachycardia on telemetry and recurrent chest pain. His ejection fraction by 2-D echo was in the 30% range. He was evaluated by Dr. Ladona Ridgelaylor, T physiology, who suggested repeat cardiac catheterization to determine whether or not his arrhythmia was "scar tachycardia or ischemically mediated.   PROCEDURE DESCRIPTION:   The patient was brought to the second floor [facility] cath lab in the postabsorptive state. He was premedicated with Valium 5 mg by mouth, IV Versed and fentanyl.Marland Kitchen. His right wristwas prepped and shaved in usual sterile fashion. Xylocaine 1% was used for local anesthesia. A 6 French sheath was inserted into the right radial artery using standard Seldinger technique. The patient received 3000 units  of heparin  intravenously.  A 5 JamaicaFrench T I G catheter and pigtail catheters were used for selective coronary angiography and left ventriculography respectively. Visipaque dye was used for the entirety of the case. Retrograde aortic, left ventricular and pullback pressures were recorded.    HEMODYNAMICS:    AO SYSTOLIC/AO DIASTOLIC: 113/85   LV SYSTOLIC/LV DIASTOLIC: 116/37  ANGIOGRAPHIC RESULTS:   1. Left main; normal  2. LAD; occluded at its origin 3. Left circumflex; dominant with an 80% ostial first obtuse marginal  branch stenosis and 40-50% proximal second obtuse marginal branch stenosis.  4. Right coronary artery; nondominant 5. Left ventriculography; RAO left ventriculogram was performed using  25 mL of Visipaque dye at 12 mL/second. The overall LVEF estimated  10. 15 %  With severe global hypokinesia and anteroapical akinesia.  IMPRESSION:[name] and reactive has an occluded ostial LAD, left dominant system with an EF of 10-15% and a dense anterior wall motion of the Mallory.. I believe his ventricular tachycardia is "scar tachycardia" and doubt that it is ischemically mediated. He would benefit from having a "viability" study done via cardiac MR. The sheath was removed and a TR band was placed on the right wrist which is patent hemostasis. The patient left the lab in stable condition.  [name],Joanette Silveria J. MD, [facility] 09/02/2014 2:26 PM

## 2014-09-02 NOTE — Progress Notes (Addendum)
SUBJECTIVE: The patient states he has some moderate shortness of breath this morning.  No chest pain.   Pending cardiac catheterization later this morning.   Patient has very poor social support and is currently homeless. He is followed by psych with previous behavioral health admissions for suicidal ideations and bipolar disorder.   CURRENT MEDICATIONS: . antiseptic oral rinse  7 mL Mouth Rinse BID  . aspirin EC  81 mg Oral Daily  . atorvastatin  80 mg Oral Daily  . DULoxetine  60 mg Oral Daily  . enoxaparin (LOVENOX) injection  40 mg Subcutaneous Q24H  . fentaNYL  25 mcg Transdermal Q72H  . gabapentin  100 mg Oral TID  . lisinopril  2.5 mg Oral Daily  . neomycin-bacitracin-polymyxin   Topical TID  . nicotine  21 mg Transdermal QHS  . OLANZapine  10 mg Oral QHS  . ranolazine  500 mg Oral Daily  . sodium chloride  3 mL Intravenous Q12H   . sodium chloride 1 mL/kg/hr (09/02/14 0435)  . amiodarone 30 mg/hr (09/01/14 2232)  . lidocaine 2 mg/min (09/02/14 0559)    OBJECTIVE: Physical Exam: Filed Vitals:   09/02/14 0300 09/02/14 0400 09/02/14 0500 09/02/14 0600  BP: 106/80 112/85 109/90 121/90  Pulse: 78 78 73 84  Temp:      TempSrc:      Resp: 13 12 13 23   Height:      Weight:      SpO2: 98% 99% 96% 98%    Intake/Output Summary (Last 24 hours) at 09/02/14 0853 Last data filed at 09/02/14 0600  Gross per 24 hour  Intake 1872.72 ml  Output   1825 ml  Net  47.72 ml    Telemetry reveals sinus rhythm with PVC's, runs of NSVT  Physical Exam: Diskempt, chronically ill appearing NAD HEENT: Unremarkable,[place], AT Neck:  6 JVD, no thyromegally Back:  No CVA tenderness Lungs:  Clear with no wheezes, rales, or rhonchi HEART:  Regular rate rhythm, no murmurs, no rubs, no clicks Abd:  soft, positive bowel sounds, no organomegally, no rebound, no guarding Ext:  2 plus pulses, no edema, no cyanosis, no clubbing, left leg contracted with buttock scab Skin:  No rashes no  nodules, multiple sores Neuro:  CN II through XII intact, motor grossly intact   LABS: Basic Metabolic Panel:  Recent Labs  16/06/9611/27/15 1521 09/02/14 0148  NA 141 139  K 4.3 3.9  CL 109 107  CO2 24 21  GLUCOSE 97 92  BUN 11 15  CREATININE 0.61 0.74  CALCIUM 8.7 8.3*  MG 2.0  --    Liver Function Tests:  Recent Labs  09/02/14 0148  AST 16  ALT 11  ALKPHOS 60  BILITOT 0.3  PROT 6.1  ALBUMIN 3.1*   CBC:  Recent Labs  09/01/14 1227 09/02/14 0148  WBC 9.3 8.1  HGB 14.2 12.8*  HCT 43.0 39.5  MCV 85.3 85.5  PLT 321 267   Cardiac Enzymes:  Recent Labs  09/01/14 1227 09/01/14 2000 09/02/14 0148  TROPONINI 0.04* 0.05* 0.04*   Thyroid Function Tests:  Recent Labs  09/01/14 1135  TSH 3.578    RADIOLOGY: Dg Chest 1 View 08/06/2014   CLINICAL DATA:  Shortness of breath and chest pain. Initial encounter.  EXAM: CHEST - 1 VIEW  COMPARISON:  03/15/2013  FINDINGS: The cardiomediastinal silhouette is unremarkable.  This is a low volume film.  There is no evidence of focal airspace disease, pulmonary edema, suspicious pulmonary  nodule/mass, pleural effusion, or pneumothorax. No acute bony abnormalities are identified.  IMPRESSION: No active disease.   Electronically Signed   By: Laveda AbbeJeff  Hu M.D.   On: 08/06/2014 01:29   Dg Chest 2 View 09/01/2014   CLINICAL DATA:  Acute onset of generalized chest pain and heaviness. Shortness of breath. Initial encounter.  EXAM: CHEST  2 VIEW  COMPARISON:  Chest radiograph performed 08/06/2014  FINDINGS: The lungs are well-aerated. Mild left perihilar airspace opacity could reflect a mild infectious process. Mild vascular congestion is seen. Mild right basilar atelectasis is noted. There is no evidence of pleural effusion or pneumothorax.  The heart is normal in size; the mediastinal contour is within normal limits. No acute osseous abnormalities are seen.  IMPRESSION: Mild left perihilar airspace opacity could reflect a mild infectious process.  Mild vascular congestion seen. Mild right basilar atelectasis noted.   Electronically Signed   By: Roanna RaiderJeffery  Chang M.D.   On: 09/01/2014 00:25    ASSESSMENT AND PLAN:  Principal Problem:   VT (ventricular tachycardia) Active Problems:   Chest pain   Essential hypertension   Mental disorder   Hyperlipidemia   Hypothyroidism   CAD, multiple vessel   Chronic combined systolic and diastolic congestive heart failure   Homeless single person   Chronic paraplegia   Obesity (BMI 30-39.9)  Await results of left heart catheterization. Additional rec's to follow. Continue IV amio and lidocaine.   Leonia ReevesGregg Taylor,M.D.

## 2014-09-03 MED ORDER — CARVEDILOL 3.125 MG PO TABS
3.1250 mg | ORAL_TABLET | Freq: Two times a day (BID) | ORAL | Status: DC
  Administered 2014-09-03 – 2014-09-06 (×7): 3.125 mg via ORAL
  Filled 2014-09-03 (×9): qty 1

## 2014-09-03 MED ORDER — OXYCODONE HCL 5 MG PO TABS
15.0000 mg | ORAL_TABLET | ORAL | Status: DC | PRN
Start: 2014-09-03 — End: 2014-09-06
  Administered 2014-09-03 – 2014-09-06 (×16): 15 mg via ORAL
  Filled 2014-09-03 (×17): qty 3

## 2014-09-03 NOTE — Progress Notes (Signed)
No return call from on call MD. Patient no longer having runs of VT or SVT at this time. Will continue to monitor.

## 2014-09-03 NOTE — Progress Notes (Signed)
Patient ID: Jeffrey [name], male   DOB: 06-May-1956, 58 y.o.   MRN: [id number]    Patient Name: Jeffrey [name] Date of Encounter: 09/03/2014     Principal Problem:   VT (ventricular tachycardia) Active Problems:   Chest pain   Essential hypertension   Mental disorder   Hyperlipidemia   Hypothyroidism   CAD, multiple vessel   Chronic combined systolic and diastolic congestive heart failure   Homeless single person   Chronic paraplegia   Obesity (BMI 30-39.9)   Ventricular tachycardia    SUBJECTIVE  No c/p or sob. Anxious  CURRENT MEDS . antiseptic oral rinse  7 mL Mouth Rinse BID  . aspirin  81 mg Oral Daily  . atorvastatin  80 mg Oral Daily  . DULoxetine  60 mg Oral Daily  . enoxaparin (LOVENOX) injection  40 mg Subcutaneous Q24H  . fentaNYL  25 mcg Transdermal Q72H  . gabapentin  100 mg Oral TID  . lisinopril  2.5 mg Oral Daily  . neomycin-bacitracin-polymyxin   Topical TID  . nicotine  21 mg Transdermal QHS  . OLANZapine  10 mg Oral QHS  . ranolazine  500 mg Oral Daily    OBJECTIVE  Filed Vitals:   09/03/14 0440 09/03/14 0500 09/03/14 0600 09/03/14 0700  BP: 110/65 108/69 89/55 95/74   Pulse: 87 85 76 76  Temp:      TempSrc:      Resp: 15 20 15 13   Height:      Weight:      SpO2: 99% 98% 97% 96%    Intake/Output Summary (Last 24 hours) at 09/03/14 0829 Last data filed at 09/03/14 0700  Gross per 24 hour  Intake 2561.6 ml  Output   2725 ml  Net -163.4 ml   Filed Weights   09/01/14 0948 09/01/14 1300 09/01/14 1708  Weight: 260 lb (117.935 kg) 264 lb 8.8 oz (120 kg) 264 lb 8.8 oz (120 kg)    PHYSICAL EXAM  General: Pleasant, NAD. Neuro: Alert and oriented X 3. Moves all extremities spontaneously. Psych: Normal affect. Anxious. HEENT:  Normal  Neck: Supple without bruits or JVD. Lungs:  Resp regular and unlabored, CTA. Heart: RRR no s3, s4, or murmurs. Abdomen: Soft, non-tender, non-distended, BS + x 4.  Extremities: No clubbing, cyanosis  or edema. DP/PT/Radials 2+ and equal bilaterally.  Accessory Clinical Findings  CBC  Recent Labs  09/01/14 1227 09/02/14 0148  WBC 9.3 8.1  HGB 14.2 12.8*  HCT 43.0 39.5  MCV 85.3 85.5  PLT 321 267   Basic Metabolic Panel  Recent Labs  09/01/14 1521 09/02/14 0148  NA 141 139  K 4.3 3.9  CL 109 107  CO2 24 21  GLUCOSE 97 92  BUN 11 15  CREATININE 0.61 0.74  CALCIUM 8.7 8.3*  MG 2.0  --    Liver Function Tests  Recent Labs  09/02/14 0148  AST 16  ALT 11  ALKPHOS 60  BILITOT 0.3  PROT 6.1  ALBUMIN 3.1*   No results for input(s): LIPASE, AMYLASE in the last 72 hours. Cardiac Enzymes  Recent Labs  09/01/14 1227 09/01/14 2000 09/02/14 0148  TROPONINI 0.04* 0.05* 0.04*   BNP Invalid input(s): POCBNP D-Dimer No results for input(s): DDIMER in the last 72 hours. Hemoglobin A1C No results for input(s): HGBA1C in the last 72 hours. Fasting Lipid Panel No results for input(s): CHOL, HDL, LDLCALC, TRIG, CHOLHDL, LDLDIRECT in the last 72 hours. Thyroid Function Tests  Recent Labs  09/01/14 1135  TSH 3.578    TELE  nsr  Radiology/Studies  Dg Chest 1 View  08/06/2014   CLINICAL DATA:  Shortness of breath and chest pain. Initial encounter.  EXAM: CHEST - 1 VIEW  COMPARISON:  03/15/2013  FINDINGS: The cardiomediastinal silhouette is unremarkable.  This is a low volume film.  There is no evidence of focal airspace disease, pulmonary edema, suspicious pulmonary nodule/mass, pleural effusion, or pneumothorax. No acute bony abnormalities are identified.  IMPRESSION: No active disease.   Electronically Signed   By: Laveda AbbeJeff  Hu M.D.   On: 08/06/2014 01:29   Dg Chest 2 View  09/01/2014   CLINICAL DATA:  Acute onset of generalized chest pain and heaviness. Shortness of breath. Initial encounter.  EXAM: CHEST  2 VIEW  COMPARISON:  Chest radiograph performed 08/06/2014  FINDINGS: The lungs are well-aerated. Mild left perihilar airspace opacity could reflect a mild  infectious process. Mild vascular congestion is seen. Mild right basilar atelectasis is noted. There is no evidence of pleural effusion or pneumothorax.  The heart is normal in size; the mediastinal contour is within normal limits. No acute osseous abnormalities are seen.  IMPRESSION: Mild left perihilar airspace opacity could reflect a mild infectious process. Mild vascular congestion seen. Mild right basilar atelectasis noted.   Electronically Signed   By: Roanna RaiderJeffery  Chang M.D.   On: 09/01/2014 00:25   Cath results reviewed   ASSESSMENT AND PLAN  1. VT with syncope 2. Ischemic cardiomyopathy, EF 10% 3. Multiple contractures 4. Multiple scabs/sores 5. Chronic pain Rec: I discussed the treatment options with the patient. The patient will never be a good surgical candidate. He will not be able to get into the MRI scanner to assess for viability because of his severe contractures. He will never be a transplant candidate because of his psychiatric problems as he has been ejected from multiple assisted living centers. He has been stable over the past 36 hours from an arrhythmia perspective. I will stop his lidocaine, continue IV amio today and switch to oral tomorrow if stable. Will advance pain meds. Prognosis is poor.  Gregg Taylor,M.D.  09/03/2014 8:29 AM

## 2014-09-03 NOTE — Progress Notes (Signed)
Pt living in homeless shelter; patient claimed that he was previously living in a facility and this facility was receiving his SSI checks while he was staying there. He ended up checking his self out of this facility, but they were still receiving his checks. Patient called the state to have his checks stopped and sent to him instead. PT claimed that he cannot receive his checks until he deemed "mentally competent" to care for himself. Patient would like help with this issue. Consult placed to case management to see if they can provide any assistance or recommendation to the patient on this issue. Thanks, Stanton Kidneyeal   Tiphani Mells, Lily PeerEAL R, RN

## 2014-09-03 NOTE — Progress Notes (Signed)
Dr. Patty SermonsBrackbill notified of patients arrhythmia in early part of the shift. No new orders needed at this time. Will continue to monitor.

## 2014-09-04 MED ORDER — AMIODARONE HCL 200 MG PO TABS
400.0000 mg | ORAL_TABLET | Freq: Every day | ORAL | Status: DC
  Administered 2014-09-04 – 2014-09-06 (×3): 400 mg via ORAL
  Filled 2014-09-04 (×5): qty 2

## 2014-09-04 MED ORDER — MEXILETINE HCL 200 MG PO CAPS
200.0000 mg | ORAL_CAPSULE | Freq: Two times a day (BID) | ORAL | Status: DC
  Administered 2014-09-04 – 2014-09-06 (×5): 200 mg via ORAL
  Filled 2014-09-04 (×6): qty 1

## 2014-09-04 MED ORDER — AMIODARONE HCL 200 MG PO TABS
200.0000 mg | ORAL_TABLET | Freq: Every day | ORAL | Status: DC
Start: 2014-09-04 — End: 2014-09-04

## 2014-09-04 NOTE — Care Management Note (Addendum)
    Page 1 of 1   09/05/2014     12:27:55 PM CARE MANAGEMENT NOTE 09/05/2014  Patient:  Jeffrey [name],Jeffrey [name]   Account Number:  [id number]  Date Initiated:  09/04/2014  Documentation initiated by:  Junius CreamerWELL,DEBBIE  Subjective/Objective Assessment:   adm w vent tach     Action/Plan:   in homeless shelter per chart   Anticipated DC Date:     Anticipated DC Plan:  HOME/SELF CARE  In-house referral  Clinical Social Worker         Choice offered to / List presented to:             Status of service:  Completed, signed off Medicare Important Message given?  NO (If response is "NO", the following Medicare IM given date fields will be blank) Date Medicare IM given:   Medicare IM given by:   Date Additional Medicare IM given:   Additional Medicare IM given by:    Discharge Disposition:  HOME/SELF CARE  Per UR Regulation:  Reviewed for med. necessity/level of care/duration of stay  If discussed at Long Length of Stay Meetings, dates discussed:   09/05/2014    Comments:  Contact # [phone] for pt.  CM discussed with pt disposition needs. Plan for d/c today and pt will use [employer] [facility] to go to [facility] on [address] to get meds. Pt has Medicaid cost will be $3.00 for medication. Pt has [facility] at [facility]. [facility] with [facility]. CM did contact [facility] and they will assist with outpatient Resource Services as well via CM. [facility]. No further needs from CM at this time.

## 2014-09-04 NOTE — Progress Notes (Signed)
Patient ID: Jeffrey [name] Jeffrey [name], male   DOB: 1956/06/04, 58 y.o.   MRN: [id number]    Patient Name: Jeffrey [name] Jeffrey [name] Date of Encounter: 09/04/2014     Principal Problem:   VT (ventricular tachycardia) Active Problems:   Chest pain   Essential hypertension   Mental disorder   Hyperlipidemia   Hypothyroidism   CAD, multiple vessel   Chronic combined systolic and diastolic congestive heart failure   Homeless single person   Chronic paraplegia   Obesity (BMI 30-39.9)   Ventricular tachycardia    SUBJECTIVE  Still anxious but no chest pain or sob.  CURRENT MEDS . amiodarone  400 mg Oral Daily  . antiseptic oral rinse  7 mL Mouth Rinse BID  . aspirin  81 mg Oral Daily  . atorvastatin  80 mg Oral Daily  . carvedilol  3.125 mg Oral BID WC  . DULoxetine  60 mg Oral Daily  . enoxaparin (LOVENOX) injection  40 mg Subcutaneous Q24H  . fentaNYL  25 mcg Transdermal Q72H  . gabapentin  100 mg Oral TID  . lisinopril  2.5 mg Oral Daily  . mexiletine  200 mg Oral Q12H  . neomycin-bacitracin-polymyxin   Topical TID  . nicotine  21 mg Transdermal QHS  . OLANZapine  10 mg Oral QHS  . ranolazine  500 mg Oral Daily    OBJECTIVE  Filed Vitals:   09/04/14 0100 09/04/14 0415 09/04/14 0745 09/04/14 0750  BP: 100/75 102/66 74/42 118/80  Pulse: 72 73 74   Temp: 97.5 F (36.4 C) 97.3 F (36.3 C) 97.7 F (36.5 C)   TempSrc: Oral Oral Oral   Resp: 12 17 14    Height:      Weight:      SpO2: 99% 100% 99%     Intake/Output Summary (Last 24 hours) at 09/04/14 0817 Last data filed at 09/04/14 0700  Gross per 24 hour  Intake 1934.1 ml  Output   2400 ml  Net -465.9 ml   Filed Weights   09/01/14 0948 09/01/14 1300 09/01/14 1708  Weight: 260 lb (117.935 kg) 264 lb 8.8 oz (120 kg) 264 lb 8.8 oz (120 kg)    PHYSICAL EXAM  General: Pleasant, NAD. Neuro: Alert and oriented X 3. Moves all extremities spontaneously. Psych: Normal affect. HEENT:  Normal  Neck: Supple without bruits or  JVD. Lungs:  Resp regular and unlabored, CTA. Heart: RRR no s3, s4, or murmurs. Abdomen: Soft, non-tender, non-distended, BS + x 4.  Extremities: No clubbing, cyanosis or edema. DP/PT/Radials 2+ and equal bilaterally. Contracted   Accessory Clinical Findings  CBC  Recent Labs  09/01/14 1227 09/02/14 0148  WBC 9.3 8.1  HGB 14.2 12.8*  HCT 43.0 39.5  MCV 85.3 85.5  PLT 321 267   Basic Metabolic Panel  Recent Labs  09/01/14 1521 09/02/14 0148  NA 141 139  K 4.3 3.9  CL 109 107  CO2 24 21  GLUCOSE 97 92  BUN 11 15  CREATININE 0.61 0.74  CALCIUM 8.7 8.3*  MG 2.0  --    Liver Function Tests  Recent Labs  09/02/14 0148  AST 16  ALT 11  ALKPHOS 60  BILITOT 0.3  PROT 6.1  ALBUMIN 3.1*   No results for input([name]): LIPASE, AMYLASE in the last 72 hours. Cardiac Enzymes  Recent Labs  09/01/14 1227 09/01/14 2000 09/02/14 0148  TROPONINI 0.04* 0.05* 0.04*   BNP Invalid input([name]): POCBNP D-Dimer No results for input([name]): DDIMER in the last  72 hours. Hemoglobin A1C No results for input([name]): HGBA1C in the last 72 hours. Fasting Lipid Panel No results for input([name]): CHOL, HDL, LDLCALC, TRIG, CHOLHDL, LDLDIRECT in the last 72 hours. Thyroid Function Tests  Recent Labs  09/01/14 1135  TSH 3.578    TELE  nsr with rare PVC'[name].  Radiology/Studies  Dg Chest 1 View  08/06/2014   CLINICAL DATA:  Shortness of breath and chest pain. Initial encounter.  EXAM: CHEST - 1 VIEW  COMPARISON:  03/15/2013  FINDINGS: The cardiomediastinal silhouette is unremarkable.  This is a low volume film.  There is no evidence of focal airspace disease, pulmonary edema, suspicious pulmonary nodule/mass, pleural effusion, or pneumothorax. No acute bony abnormalities are identified.  IMPRESSION: No active disease.   Electronically Signed   By: Laveda AbbeJeff  Hu M.D.   On: 08/06/2014 01:29   Dg Chest 2 View  09/01/2014   CLINICAL DATA:  Acute onset of generalized chest pain and heaviness. Shortness  of breath. Initial encounter.  EXAM: CHEST  2 VIEW  COMPARISON:  Chest radiograph performed 08/06/2014  FINDINGS: The lungs are well-aerated. Mild left perihilar airspace opacity could reflect a mild infectious process. Mild vascular congestion is seen. Mild right basilar atelectasis is noted. There is no evidence of pleural effusion or pneumothorax.  The heart is normal in size; the mediastinal contour is within normal limits. No acute osseous abnormalities are seen.  IMPRESSION: Mild left perihilar airspace opacity could reflect a mild infectious process. Mild vascular congestion seen. Mild right basilar atelectasis noted.   Electronically Signed   By: Roanna RaiderJeffery  Chang M.D.   On: 09/01/2014 00:25    ASSESSMENT AND PLAN  1. VT with syncope 2. End-stage ischemic cardiomyopathy 3. Chronic systolic heart failure, EF <10% 4. Multiple contractures Rec: will transition IV amio to oral and start Mexitil. Will transfer to tele. Anticipate keeping the patient another 48 hours in the hospital. He will need work on a place to stay. He is capable of handling his affairs.  Jeffrey [name],M.D.  09/04/2014 8:17 AM

## 2014-09-05 MED ORDER — OXYCODONE HCL 5 MG PO TABS
15.0000 mg | ORAL_TABLET | Freq: Once | ORAL | Status: AC
  Administered 2014-09-05: 15 mg via ORAL

## 2014-09-05 NOTE — Progress Notes (Signed)
Patient ID: Jeffrey [name], male   DOB: 08-27-1956, 58 y.o.   MRN: [id number]    Patient Name: Jeffrey [name] Date of Encounter: 09/05/2014     Principal Problem:   VT (ventricular tachycardia) Active Problems:   Chest pain   Essential hypertension   Mental disorder   Hyperlipidemia   Hypothyroidism   CAD, multiple vessel   Chronic combined systolic and diastolic congestive heart failure   Homeless single person   Chronic paraplegia   Obesity (BMI 30-39.9)   Ventricular tachycardia    SUBJECTIVE  Still anxious but no chest pain or sob.  CURRENT MEDS . amiodarone  400 mg Oral Daily  . antiseptic oral rinse  7 mL Mouth Rinse BID  . aspirin  81 mg Oral Daily  . atorvastatin  80 mg Oral Daily  . carvedilol  3.125 mg Oral BID WC  . DULoxetine  60 mg Oral Daily  . enoxaparin (LOVENOX) injection  40 mg Subcutaneous Q24H  . fentaNYL  25 mcg Transdermal Q72H  . gabapentin  100 mg Oral TID  . lisinopril  2.5 mg Oral Daily  . mexiletine  200 mg Oral Q12H  . neomycin-bacitracin-polymyxin   Topical TID  . nicotine  21 mg Transdermal QHS  . OLANZapine  10 mg Oral QHS  . ranolazine  500 mg Oral Daily    OBJECTIVE  Filed Vitals:   09/05/14 0400 09/05/14 0805 09/05/14 0848 09/05/14 1047  BP: 114/80 119/77  120/86  Pulse: 78 80 84   Temp: 97.9 F (36.6 C) 98 F (36.7 C)    TempSrc: Oral Oral    Resp: 16 14    Height:      Weight: 251 lb (113.853 kg)     SpO2: 98% 95%      Intake/Output Summary (Last 24 hours) at 09/05/14 1243 Last data filed at 09/05/14 1126  Gross per 24 hour  Intake    880 ml  Output   3900 ml  Net  -3020 ml   Filed Weights   09/01/14 1300 09/01/14 1708 09/05/14 0400  Weight: 264 lb 8.8 oz (120 kg) 264 lb 8.8 oz (120 kg) 251 lb (113.853 kg)    PHYSICAL EXAM  General: Pleasant, NAD. Neuro: Alert and oriented X 3. Moves all extremities spontaneously. Psych: anxious HEENT:  Normal  Neck: Supple without bruits or JVD. Lungs:  Resp  regular and unlabored, CTA. Heart: RRR no s3, s4, or murmurs. Abdomen: Soft, non-tender, non-distended, BS + x 4.  Extremities: No clubbing, cyanosis or edema. DP/PT/Radials 2+ and equal bilaterally. Contracted   Accessory Clinical Findings  CBC No results for input(s): WBC, NEUTROABS, HGB, HCT, MCV, PLT in the last 72 hours. Basic Metabolic Panel No results for input(s): NA, K, CL, CO2, GLUCOSE, BUN, CREATININE, CALCIUM, MG, PHOS in the last 72 hours. Liver Function Tests No results for input(s): AST, ALT, ALKPHOS, BILITOT, PROT, ALBUMIN in the last 72 hours. No results for input(s): LIPASE, AMYLASE in the last 72 hours. Cardiac Enzymes No results for input(s): CKTOTAL, CKMB, CKMBINDEX, TROPONINI in the last 72 hours. BNP Invalid input(s): POCBNP D-Dimer No results for input(s): DDIMER in the last 72 hours. Hemoglobin A1C No results for input(s): HGBA1C in the last 72 hours. Fasting Lipid Panel No results for input(s): CHOL, HDL, LDLCALC, TRIG, CHOLHDL, LDLDIRECT in the last 72 hours. Thyroid Function Tests No results for input(s): TSH, T4TOTAL, T3FREE, THYROIDAB in the last 72 hours.  Invalid input(s): FREET3  TELE  nsr  with rare PVC's.  Radiology/Studies  Dg Chest 2 View  09/01/2014   CLINICAL DATA:  Acute onset of generalized chest pain and heaviness. Shortness of breath. Initial encounter.  EXAM: CHEST  2 VIEW  COMPARISON:  Chest radiograph performed 08/06/2014  FINDINGS: The lungs are well-aerated. Mild left perihilar airspace opacity could reflect a mild infectious process. Mild vascular congestion is seen. Mild right basilar atelectasis is noted. There is no evidence of pleural effusion or pneumothorax.  The heart is normal in size; the mediastinal contour is within normal limits. No acute osseous abnormalities are seen.  IMPRESSION: Mild left perihilar airspace opacity could reflect a mild infectious process. Mild vascular congestion seen. Mild right basilar atelectasis  noted.   Electronically Signed   By: Roanna RaiderJeffery  Chang M.D.   On: 09/01/2014 00:25    ASSESSMENT AND PLAN  1. VT with syncope 2. End-stage ischemic cardiomyopathy 3. Chronic systolic heart failure, EF <10% 4. Multiple contractures Rec: on oral amiodarone and start Mexitil. Will transfer to tele. Anticipate keeping the patient another 24 hours in the hospital. He will need work on a place to stay, his prescriptions. He is capable of handling his affairs.  Donnia Poplaski,M.D.  09/05/2014 12:43 PM

## 2014-09-05 NOTE — Progress Notes (Signed)
CSW Proofreader(Clinical Social Worker) spoke with pt who [facility]. Pt informed CS[facility]. Pt does not have his wheelchair at the hospital. CSW inquired about pt transportation. Pt informed CSW that in the past CSW has attempted to arrange non-emergent ambulance but ambulance company denies transport. In this case, pt is able to take a taxi to shelter. Pt able to transfer into and out of wheelchair on his own. [facility] able to [facility] his wheelchair on arrival. Pt confident that transportation will not be an issue if above course is followed. CSW to assist when pt is medically stable for discharge.  Heman Que, LCSWA [phone]

## 2014-09-06 MED ORDER — AMIODARONE HCL 400 MG PO TABS: 400.0000 mg | ORAL_TABLET | Freq: Every day | ORAL | Status: AC

## 2014-09-06 MED ORDER — OXYCODONE-ACETAMINOPHEN 5-325 MG PO TABS
1.0000 | ORAL_TABLET | Freq: Three times a day (TID) | ORAL | Status: DC | PRN
  Administered 2014-09-06: 1 via ORAL
  Filled 2014-09-06: qty 1

## 2014-09-06 MED ORDER — MEXILETINE HCL 200 MG PO CAPS: 200.0000 mg | ORAL_CAPSULE | Freq: Two times a day (BID) | ORAL | Status: AC

## 2014-09-06 MED ORDER — FENTANYL 25 MCG/HR TD PT72: 25.0000 ug | MEDICATED_PATCH | TRANSDERMAL | Status: DC

## 2014-09-06 MED ORDER — GABAPENTIN 100 MG PO CAPS: 100.0000 mg | ORAL_CAPSULE | Freq: Three times a day (TID) | ORAL | Status: DC

## 2014-09-06 MED ORDER — CARVEDILOL 3.125 MG PO TABS: 3.1250 mg | ORAL_TABLET | Freq: Two times a day (BID) | ORAL | Status: DC

## 2014-09-06 NOTE — Discharge Summary (Signed)
Physician Discharge Summary     Cardiologist: Ladona Ridgel Patient ID: CHRISTOPHERE HILLHOUSE MRN: [id number] DOB/AGE: [birth year] 59 y.o.  Admit date: 08/31/2014 Discharge date: 09/06/2014  Admission Diagnoses:  VT  Discharge Diagnoses:  Principal Problem:   VT (ventricular tachycardia) Active Problems:   Chest pain   Essential hypertension   Mental disorder   Hyperlipidemia   Hypothyroidism   CAD, multiple vessel   Chronic combined systolic and diastolic congestive heart failure   Homeless single person   Chronic paraplegia   Obesity (BMI 30-39.9)   Ventricular tachycardia   Discharged Condition: stable  Hospital Course:   With past medical history of depression, hypertension, homelessness and chronic pain and recent discharge last month from [facility] where it looked as if he had multivessel disease that was going to be treated medically who came to the emergency room complaining of severe chest pain. Patient has had a few ER visits in the last few weeks complaining of pain in different areas. He stated that he is staying at a homeless shelter and the night prior experienced a very sudden onset light headed spell that led to some substernal left-sided chest pain. He came into the emergency room and lab work was unrevealing and EKG noted some ST elevations in lead V3, but otherwise unrevealing. Hospitalists were called for further evaluation and admission. Patient sent to the floor.   Addendum: Following arrival, patient initially somnolent when he was examined. Less than an hour later, he was more awake and complaining of spasms and his chronic lower extremity pain. He had a very brief run of ventricular tachycardia about 15 beats. Proximally one hour later, patient suddenly went into sustained ventricular tachycardia. Cardiology who happened to be on the floor called a code. Prior to patient being defibrillated, he converted back to sinus rhythm on his own. Patient started on  amiodarone drip and transferred to ICU.  Patient was also started on lidocaine which was later changed to oral Mexitil.  He underwent left heart catheterization which revealed a dominant left circumflex with 80% ostial first obtuse marginal branch stenosis and 40-50% proximal second obtuse marginal branch stenosis. EF was 10-15%.  Per Dr. Ladona Ridgel, The patient will never be a good surgical candidate. He will not be able to get into the MRI scanner to assess for viability because of his severe contractures. He will never be a transplant candidate because of his psychiatric problems as he has been ejected from multiple assisted living centers.  Social work was consulted to help living situation.  Case management was also consulted.  Patient be continued on oral amiodarone and Mexitil.  The patient was seen by Dr. Ladona Ridgel who felt he was stable for DC home. Follow-up will be arranged for one month.   Consults: Electrophysiology, social work, his management  Significant Diagnostic Studies:   CARDIAC CATHETERIZATION     History obtained from chart review.Mr. Stillinger is a 59 year old moderately overweight Caucasian male with multiple medical problems who was admitted on 08/31/14 with chest pain and polymorphic ventricular tachycardia. 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 No intervention was apparently performed and he was discharged on medical therapy. He had polymorphic ventricular tachycardia on telemetry and recurrent chest pain. His ejection fraction by 2-D echo was in the 30% range. He was evaluated by Dr. Ladona Ridgel, T physiology, who suggested repeat cardiac catheterization to determine whether or not his arrhythmia was "scar tachycardia or ischemically mediated.  PROCEDURE DESCRIPTION:   The patient was brought to the second floor  Cardiac cath lab in the postabsorptive state. He was premedicated with Valium  5 mg by mouth, IV Versed and fentanyl.Marland Kitchen His right wristwas prepped and shaved in usual sterile fashion. Xylocaine 1% was used for local anesthesia. A 6 French sheath was inserted into the right radial artery using standard Seldinger technique. The patient received 3000 units of heparin intravenously. A 5 [place] T I G catheter and pigtail catheters were used for selective coronary angiography and left ventriculography respectively. Visipaque dye was used for the entirety of the case. Retrograde aortic, left ventricular and pullback pressures were recorded.    HEMODYNAMICS:   AO SYSTOLIC/AO DIASTOLIC: 113/85  LV SYSTOLIC/LV DIASTOLIC: 116/37  ANGIOGRAPHIC RESULTS:   1. Left main; normal  2. LAD; occluded at its origin 3. Left circumflex; dominant with an 80% ostial first obtuse marginal branch stenosis and 40-50% proximal second obtuse marginal branch stenosis.  4. Right coronary artery; nondominant 5. Left ventriculography; RAO left ventriculogram was performed using  25 mL of Visipaque dye at 12 mL/second. The overall LVEF estimated  10. 15 % With severe global hypokinesia and anteroapical akinesia.  IMPRESSION:Mr. Skeet and reactive has an occluded ostial LAD, left dominant system with an EF of 10-15% and a dense anterior wall motion of the Mallory.. I believe his ventricular tachycardia is "scar tachycardia" and doubt that it is ischemically mediated. He would benefit from having a "viability" study done via cardiac MR. The sheath was removed and a TR band was placed on the right wrist which is patent hemostasis. The patient left the lab in stable condition.  Runell Gess MD, [facility] 09/02/2014  Treatments: See above  Discharge Exam: Blood pressure 97/62, pulse 64, temperature 97.5 F (36.4 C), temperature source Oral, resp. rate 19, height  (1.93 m), weight 250 lb 9 oz (113.655 kg), SpO2 100 %.   Disposition: 01-Home or Self Care      Discharge  Instructions    Diet - low sodium heart healthy    Complete by:  As directed      Increase activity slowly    Complete by:  As directed             Medication List    STOP taking these medications        ciprofloxacin 500 MG tablet  Commonly known as:  CIPRO     cyclobenzaprine 10 MG tablet  Commonly known as:  FLEXERIL     oxyCODONE-acetaminophen 5-325 MG per tablet  Commonly known as:  PERCOCET      TAKE these medications        acetaminophen-codeine 300-30 MG per tablet  Commonly known as:  TYLENOL #3  Take 1 tablet by mouth every 8 (eight) hours as needed.     amiodarone 400 MG tablet  Commonly known as:  PACERONE  Take 1 tablet (400 mg total) by mouth daily.     aspirin 81 MG EC tablet  Take 1 tablet (81 mg total) by mouth daily.     atorvastatin 80 MG tablet  Commonly known as:  LIPITOR  Take 1 tablet (80 mg total) by mouth daily.     carvedilol 3.125 MG tablet  Commonly known as:  COREG  Take 1 tablet (3.125 mg total) by mouth 2 (two) times daily with a meal.     DULoxetine 60 MG capsule  Commonly known as:  CYMBALTA  Take 1 capsule (60 mg  total) by mouth daily. For depression/pain control     fentaNYL 25 MCG/HR patch  Commonly known as:  DURAGESIC - dosed mcg/hr  Place 1 patch (25 mcg total) onto the skin every 3 (three) days.     gabapentin 100 MG capsule  Commonly known as:  NEURONTIN  Take 1 capsule (100 mg total) by mouth 3 (three) times daily.     lisinopril 2.5 MG tablet  Commonly known as:  PRINIVIL,ZESTRIL  Take 2.5 mg by mouth daily.     mexiletine 200 MG capsule  Commonly known as:  MEXITIL  Take 1 capsule (200 mg total) by mouth every 12 (twelve) hours.     multivitamin with minerals Tabs tablet  Take 1 tablet by mouth daily.     nicotine 21 mg/24hr patch  Commonly known as:  NICODERM CQ - dosed in mg/24 hours  Place 1 patch (21 mg total) onto the skin at bedtime.     OLANZapine 10 MG tablet  Commonly known as:  ZYPREXA  Take  1 tablet (10 mg total) by mouth at bedtime.     ranolazine 500 MG 12 hr tablet  Commonly known as:  RANEXA  Take 1 tablet (500 mg total) by mouth daily.       Follow-up Information    Follow up with [facility] On 09/10/2014.   Why:  @ 9:00 am for Hospital follow up appointment   Contact information:   [address] [place] [address] [phone]      Follow up with Lewayne Bunting, MD.   Specialty:  Cardiology   Why:  Office will call you with follow-up appointment   Contact information:   [address] [place] [address] [phone]      Greater than 30 minutes was spent completing the patient's discharge.   SignedWilburt Finlay, PAC 09/06/2014, 9:23 AM  EP Attending  Patient seen and examined. Agree with above.   Leonia Reeves.D.

## 2014-09-06 NOTE — Progress Notes (Signed)
Patient ID: KNIGHT OELKERS, male   DOB: 1956/01/06, 59 y.o.   MRN: [id number]     Patient Name: Jeffrey [name] Date of Encounter: 09/06/2014     Principal Problem:   VT (ventricular tachycardia) Active Problems:   Chest pain   Essential hypertension   Mental disorder   Hyperlipidemia   Hypothyroidism   CAD, multiple vessel   Chronic combined systolic and diastolic congestive heart failure   Homeless single person   Chronic paraplegia   Obesity (BMI 30-39.9)   Ventricular tachycardia    SUBJECTIVE  Patient sleeping soundly and does not awake to verbal or gentle stimulation. Received pain meds.  CURRENT MEDS . amiodarone  400 mg Oral Daily  . antiseptic oral rinse  7 mL Mouth Rinse BID  . aspirin  81 mg Oral Daily  . atorvastatin  80 mg Oral Daily  . carvedilol  3.125 mg Oral BID WC  . DULoxetine  60 mg Oral Daily  . enoxaparin (LOVENOX) injection  40 mg Subcutaneous Q24H  . fentaNYL  25 mcg Transdermal Q72H  . gabapentin  100 mg Oral TID  . lisinopril  2.5 mg Oral Daily  . mexiletine  200 mg Oral Q12H  . neomycin-bacitracin-polymyxin   Topical TID  . nicotine  21 mg Transdermal QHS  . OLANZapine  10 mg Oral QHS  . ranolazine  500 mg Oral Daily    OBJECTIVE  Filed Vitals:   09/05/14 1047 09/05/14 1934 09/06/14 0000 09/06/14 0400  BP: 120/86 109/76 115/89 109/73  Pulse:  79 78 79  Temp:  97.5 F (36.4 C) 97.8 F (36.6 C) 97.6 F (36.4 C)  TempSrc:  Oral Oral Oral  Resp:  Height:      Weight:    250 lb 9 oz (113.655 kg)  SpO2:  98% 99% 98%    Intake/Output Summary (Last 24 hours) at 09/06/14 0730 Last data filed at 09/06/14 0451  Gross per 24 hour  Intake    600 ml  Output   3750 ml  Net  -3150 ml   Filed Weights   09/01/14 1708 09/05/14 0400 09/06/14 0400  Weight: 264 lb 8.8 oz (120 kg) 251 lb (113.853 kg) 250 lb 9 oz (113.655 kg)    PHYSICAL EXAM  General: Pleasant, middle aged man, NAD. Neuro: Alert and oriented X 3. Moves all  extremities spontaneously. Psych: anxious HEENT:  Normal  Neck: Supple without bruits or JVD. Lungs:  Resp regular and unlabored, CTA. Heart: RRR no s3, s4, or murmurs. Abdomen: Soft, non-tender, non-distended, BS + x 4.  Extremities: No clubbing, cyanosis or edema. DP/PT/Radials 2+ and equal bilaterally. Contracted   Accessory Clinical Findings  CBC No results for input(s): WBC, NEUTROABS, HGB, HCT, MCV, PLT in the last 72 hours. Basic Metabolic Panel No results for input(s): NA, K, CL, CO2, GLUCOSE, BUN, CREATININE, CALCIUM, MG, PHOS in the last 72 hours. Liver Function Tests No results for input(s): AST, ALT, ALKPHOS, BILITOT, PROT, ALBUMIN in the last 72 hours. No results for input(s): LIPASE, AMYLASE in the last 72 hours. Cardiac Enzymes No results for input(s): CKTOTAL, CKMB, CKMBINDEX, TROPONINI in the last 72 hours. BNP Invalid input(s): POCBNP D-Dimer No results for input(s): DDIMER in the last 72 hours. Hemoglobin A1C No results for input(s): HGBA1C in the last 72 hours. Fasting Lipid Panel No results for input(s): CHOL, HDL, LDLCALC, TRIG, CHOLHDL, LDLDIRECT in the last 72 hours. Thyroid Function Tests No results for input(s): TSH,  T4TOTAL, T3FREE, THYROIDAB in the last 72 hours.  Invalid input(s): FREET3  TELE  nsr with rare PVC's.  Radiology/Studies  Dg Chest 2 View  09/01/2014   CLINICAL DATA:  Acute onset of generalized chest pain and heaviness. Shortness of breath. Initial encounter.  EXAM: CHEST  2 VIEW  COMPARISON:  Chest radiograph performed 08/06/2014  FINDINGS: The lungs are well-aerated. Mild left perihilar airspace opacity could reflect a mild infectious process. Mild vascular congestion is seen. Mild right basilar atelectasis is noted. There is no evidence of pleural effusion or pneumothorax.  The heart is normal in size; the mediastinal contour is within normal limits. No acute osseous abnormalities are seen.  IMPRESSION: Mild left perihilar airspace  opacity could reflect a mild infectious process. Mild vascular congestion seen. Mild right basilar atelectasis noted.   Electronically Signed   By: Roanna Raider M.D.   On: 09/01/2014 00:25    ASSESSMENT AND PLAN  1. VT with syncope 2. End-stage ischemic cardiomyopathy 3. Chronic systolic heart failure, EF <10% 4. Multiple contractures Rec: on oral amiodarone and continue Mexitil. He is ready for discharge. He will go home on current meds. I would like to see him back in a month.He is capable of handling his affairs.  Gregg Taylor,M.D.  09/06/2014 7:30 AM
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
## 2014-09-13 ENCOUNTER — Emergency Department (HOSPITAL_COMMUNITY)
Admission: EM | Admit: 2014-09-13 | Discharge: 2014-09-14 | Disposition: A | Discharge status: Home / Self Care | Payer: Medicaid Other | Source: Home / Self Care | Attending: Emergency Medicine | Admitting: Emergency Medicine

## 2014-09-13 ENCOUNTER — Encounter (HOSPITAL_COMMUNITY): Payer: Self-pay | Admitting: Emergency Medicine

## 2014-09-13 DIAGNOSIS — Z8619 Personal history of other infectious and parasitic diseases: Secondary | ICD-10-CM | POA: Insufficient documentation

## 2014-09-13 DIAGNOSIS — M545 Low back pain: Secondary | ICD-10-CM | POA: Insufficient documentation

## 2014-09-13 DIAGNOSIS — M79604 Pain in right leg: Secondary | ICD-10-CM | POA: Insufficient documentation

## 2014-09-13 DIAGNOSIS — G20 Parkinson's disease: Secondary | ICD-10-CM | POA: Insufficient documentation

## 2014-09-13 DIAGNOSIS — E780 Pure hypercholesterolemia: Secondary | ICD-10-CM | POA: Diagnosis not present

## 2014-09-13 DIAGNOSIS — Z7982 Long term (current) use of aspirin: Secondary | ICD-10-CM | POA: Diagnosis not present

## 2014-09-13 DIAGNOSIS — I10 Essential (primary) hypertension: Secondary | ICD-10-CM | POA: Insufficient documentation

## 2014-09-13 DIAGNOSIS — Z79899 Other long term (current) drug therapy: Secondary | ICD-10-CM | POA: Diagnosis not present

## 2014-09-13 DIAGNOSIS — F419 Anxiety disorder, unspecified: Secondary | ICD-10-CM | POA: Insufficient documentation

## 2014-09-13 DIAGNOSIS — M79605 Pain in left leg: Secondary | ICD-10-CM | POA: Insufficient documentation

## 2014-09-13 DIAGNOSIS — M1990 Unspecified osteoarthritis, unspecified site: Secondary | ICD-10-CM | POA: Insufficient documentation

## 2014-09-13 DIAGNOSIS — Z720 Tobacco use: Secondary | ICD-10-CM | POA: Insufficient documentation

## 2014-09-13 DIAGNOSIS — E785 Hyperlipidemia, unspecified: Secondary | ICD-10-CM | POA: Insufficient documentation

## 2014-09-13 DIAGNOSIS — I2510 Atherosclerotic heart disease of native coronary artery without angina pectoris: Secondary | ICD-10-CM | POA: Insufficient documentation

## 2014-09-13 DIAGNOSIS — Z9889 Other specified postprocedural states: Secondary | ICD-10-CM | POA: Insufficient documentation

## 2014-09-13 DIAGNOSIS — R52 Pain, unspecified: Secondary | ICD-10-CM | POA: Diagnosis present

## 2014-09-13 DIAGNOSIS — F329 Major depressive disorder, single episode, unspecified: Secondary | ICD-10-CM

## 2014-09-13 DIAGNOSIS — G8929 Other chronic pain: Secondary | ICD-10-CM | POA: Insufficient documentation

## 2014-09-13 MED ORDER — CYCLOBENZAPRINE HCL 10 MG PO TABS
5.0000 mg | ORAL_TABLET | Freq: Once | ORAL | Status: AC
  Administered 2014-09-13: 5 mg via ORAL
  Filled 2014-09-13: qty 1

## 2014-09-13 MED ORDER — IBUPROFEN 600 MG PO TABS: 600.0000 mg | ORAL_TABLET | Freq: Three times a day (TID) | ORAL | Status: DC | PRN

## 2014-09-13 MED ORDER — OXYCODONE-ACETAMINOPHEN 5-325 MG PO TABS
1.0000 | ORAL_TABLET | Freq: Once | ORAL | Status: AC
  Administered 2014-09-13: 1 via ORAL
  Filled 2014-09-13: qty 1

## 2014-09-13 MED ORDER — CYCLOBENZAPRINE HCL 10 MG PO TABS: 10.0000 mg | ORAL_TABLET | Freq: Three times a day (TID) | ORAL | Status: DC | PRN

## 2014-09-13 MED ORDER — IBUPROFEN 800 MG PO TABS
800.0000 mg | ORAL_TABLET | Freq: Once | ORAL | Status: AC
  Administered 2014-09-13: 800 mg via ORAL
  Filled 2014-09-13: qty 1

## 2014-09-13 NOTE — Discharge Instructions (Signed)
Chronic Pain Discharge Instructions  °Emergency care providers appreciate that many patients coming to us are in severe pain and we wish to address their pain in the safest, most responsible manner.  It is important to recognize however, that the proper treatment of chronic pain differs from that of the pain of injuries and acute illnesses.  Our goal is to provide quality, safe, personalized care and we thank you for giving us the opportunity to serve you. °The use of narcotics and related agents for chronic pain syndromes may lead to additional physical and psychological problems.  Nearly as many people die from prescription narcotics each year as die from car crashes.  Additionally, this risk is increased if such prescriptions are obtained from a variety of sources.  Therefore, only your primary care physician or a pain management specialist is able to safely treat such syndromes with narcotic medications long-term.   ° °Documentation revealing such prescriptions have been sought from multiple sources may prohibit us from providing a refill or different narcotic medication.  Your name may be checked first through the [place] Controlled Substances Reporting System.  This database is a record of controlled substance medication prescriptions that the patient has received.  This has been established by [employer] in an effort to eliminate the dangerous, and often life threatening, practice of obtaining multiple prescriptions from different medical providers.  ° °If you have a chronic pain syndrome (i.e. chronic headaches, recurrent back or neck pain, dental pain, abdominal or pelvis pain without a specific diagnosis, or neuropathic pain such as fibromyalgia) or recurrent visits for the same condition without an acute diagnosis, you may be treated with non-narcotics and other non-addictive medicines.  Allergic reactions or negative side effects that may be reported by a patient to such medications will not  typically lead to the use of a narcotic analgesic or other controlled substance as an alternative. °  °Patients managing chronic pain with a personal physician should have provisions in place for breakthrough pain.  If you are in crisis, you should call your physician.  If your physician directs you to the emergency department, please have the doctor call and speak to our attending physician concerning your care. °  °When patients come to the Emergency Department (ED) with acute medical conditions in which the Emergency Department physician feels appropriate to prescribe narcotic or sedating pain medication, the physician will prescribe these in very limited quantities.  The amount of these medications will last only until you can see your primary care physician in his/her office.  Any patient who returns to the ED seeking refills should expect only non-narcotic pain medications.  ° °In the event of an acute medical condition exists and the emergency physician feels it is necessary that the patient be given a narcotic or sedating medication -  a responsible adult driver should be present in the room prior to the medication being given by the nurse. °  °Prescriptions for narcotic or sedating medications that have been lost, stolen or expired will not be refilled in the Emergency Department.   ° °Patients who have chronic pain may receive non-narcotic prescriptions until seen by their primary care physician.  It is every patient’s personal responsibility to maintain active prescriptions with his or her primary care physician or specialist. °
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
## 2014-09-14 ENCOUNTER — Encounter (HOSPITAL_COMMUNITY): Payer: Self-pay | Admitting: *Deleted

## 2014-09-14 ENCOUNTER — Emergency Department (HOSPITAL_COMMUNITY)
Admission: EM | Admit: 2014-09-14 | Discharge: 2014-09-14 | Disposition: A | Discharge status: Home / Self Care | Payer: Medicaid Other | Attending: Emergency Medicine | Admitting: Emergency Medicine

## 2014-09-14 DIAGNOSIS — M79604 Pain in right leg: Secondary | ICD-10-CM

## 2014-09-14 DIAGNOSIS — M79605 Pain in left leg: Secondary | ICD-10-CM

## 2014-09-14 MED ORDER — MECLIZINE HCL 50 MG PO TABS: 50.0000 mg | ORAL_TABLET | Freq: Two times a day (BID) | ORAL | Status: DC | PRN

## 2014-09-14 MED ORDER — KETOROLAC TROMETHAMINE 60 MG/2ML IM SOLN
60.0000 mg | Freq: Once | INTRAMUSCULAR | Status: AC
  Administered 2014-09-14: 60 mg via INTRAMUSCULAR
  Filled 2014-09-14: qty 2

## 2014-09-14 MED ORDER — CYCLOBENZAPRINE HCL 10 MG PO TABS
10.0000 mg | ORAL_TABLET | Freq: Once | ORAL | Status: AC
  Administered 2014-09-14: 10 mg via ORAL
  Filled 2014-09-14: qty 1

## 2014-09-14 MED ORDER — MECLIZINE HCL 25 MG PO TABS
25.0000 mg | ORAL_TABLET | Freq: Once | ORAL | Status: AC
  Administered 2014-09-14: 25 mg via ORAL
  Filled 2014-09-14: qty 1

## 2014-09-14 MED ORDER — NAPROXEN 500 MG PO TABS: 500.0000 mg | ORAL_TABLET | Freq: Two times a day (BID) | ORAL | Status: DC

## 2014-09-14 MED ORDER — CYCLOBENZAPRINE HCL 10 MG PO TABS: 10.0000 mg | ORAL_TABLET | Freq: Two times a day (BID) | ORAL | Status: DC | PRN

## 2014-09-14 NOTE — ED Notes (Signed)
[facility] states they do not take patients back to homeless shelters. [facility] states this is the last time they will transport this patient to a homeless shelter.
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
## 2014-09-14 NOTE — Discharge Instructions (Signed)
Please follow the directions provided.  Be sure to follow-up with Dr. Hyman HopesJegede regarding your pain for further management.  Take the ibuprofen and flexeril you have been prescribed as directed to help with this pain.  You may take the Meclizine twice a day for the dizziness you feel when you close your eyes.  Don't hesitate to return for any new, worsening or concerning symptoms.     SEEK IMMEDIATE MEDICAL CARE IF:  You have pain that is getting worse and is not relieved by medications.  You develop chest pain that is associated with shortness or breath, sweating, feeling sick to your stomach (nauseous), or throw up (vomit).  Your pain becomes localized to the abdomen.  You develop any new symptoms that seem different or that concern you.

## 2014-09-14 NOTE — ED Notes (Signed)
[facility] Expected date:  Expected time:  Means of arrival:  Comments: 8M pain allover, WC bound

## 2014-10-08 ENCOUNTER — Encounter: Payer: Self-pay | Admitting: Internal Medicine

## 2014-10-12 ENCOUNTER — Emergency Department (HOSPITAL_COMMUNITY)
Admission: EM | Admit: 2014-10-12 | Discharge: 2014-10-12 | Disposition: A | Discharge status: Home / Self Care | Payer: Medicaid Other | Attending: Emergency Medicine | Admitting: Emergency Medicine

## 2014-10-12 ENCOUNTER — Emergency Department (HOSPITAL_COMMUNITY): Payer: Medicaid Other

## 2014-10-12 ENCOUNTER — Encounter (HOSPITAL_COMMUNITY): Payer: Self-pay | Admitting: Family Medicine

## 2014-10-12 DIAGNOSIS — F329 Major depressive disorder, single episode, unspecified: Secondary | ICD-10-CM | POA: Diagnosis not present

## 2014-10-12 DIAGNOSIS — M1990 Unspecified osteoarthritis, unspecified site: Secondary | ICD-10-CM | POA: Diagnosis not present

## 2014-10-12 DIAGNOSIS — R0602 Shortness of breath: Secondary | ICD-10-CM | POA: Diagnosis not present

## 2014-10-12 DIAGNOSIS — G20 Parkinson's disease: Secondary | ICD-10-CM | POA: Diagnosis not present

## 2014-10-12 DIAGNOSIS — F1110 Opioid abuse, uncomplicated: Secondary | ICD-10-CM | POA: Insufficient documentation

## 2014-10-12 DIAGNOSIS — E780 Pure hypercholesterolemia: Secondary | ICD-10-CM | POA: Insufficient documentation

## 2014-10-12 DIAGNOSIS — Z791 Long term (current) use of non-steroidal anti-inflammatories (NSAID): Secondary | ICD-10-CM | POA: Insufficient documentation

## 2014-10-12 DIAGNOSIS — Z7982 Long term (current) use of aspirin: Secondary | ICD-10-CM | POA: Insufficient documentation

## 2014-10-12 DIAGNOSIS — I2510 Atherosclerotic heart disease of native coronary artery without angina pectoris: Secondary | ICD-10-CM | POA: Diagnosis not present

## 2014-10-12 DIAGNOSIS — Z79899 Other long term (current) drug therapy: Secondary | ICD-10-CM | POA: Insufficient documentation

## 2014-10-12 DIAGNOSIS — R0789 Other chest pain: Secondary | ICD-10-CM | POA: Diagnosis not present

## 2014-10-12 DIAGNOSIS — Z720 Tobacco use: Secondary | ICD-10-CM | POA: Insufficient documentation

## 2014-10-12 DIAGNOSIS — R079 Chest pain, unspecified: Secondary | ICD-10-CM

## 2014-10-12 DIAGNOSIS — Z8619 Personal history of other infectious and parasitic diseases: Secondary | ICD-10-CM | POA: Insufficient documentation

## 2014-10-12 DIAGNOSIS — I10 Essential (primary) hypertension: Secondary | ICD-10-CM | POA: Insufficient documentation

## 2014-10-12 DIAGNOSIS — F1410 Cocaine abuse, uncomplicated: Secondary | ICD-10-CM | POA: Diagnosis not present

## 2014-10-12 DIAGNOSIS — G8929 Other chronic pain: Secondary | ICD-10-CM | POA: Diagnosis not present

## 2014-10-12 DIAGNOSIS — F419 Anxiety disorder, unspecified: Secondary | ICD-10-CM | POA: Insufficient documentation

## 2014-10-12 LAB — BASIC METABOLIC PANEL
Anion gap: 9 (ref 5–15)
BUN: 9 mg/dL (ref 6–23)
CALCIUM: 9.3 mg/dL (ref 8.4–10.5)
CO2: 25 mmol/L (ref 19–32)
CREATININE: 0.97 mg/dL (ref 0.50–1.35)
Chloride: 107 mmol/L (ref 96–112)
GFR calc Af Amer: >90 mL/min (ref >90)
Glucose, Bld: 46 mg/dL — ABNORMAL LOW (ref 70–99)
POTASSIUM: 3.9 mmol/L (ref 3.5–5.1)
Sodium: 141 mmol/L (ref 135–145)

## 2014-10-12 LAB — RAPID URINE DRUG SCREEN, HOSP PERFORMED
Amphetamines: NOT DETECTED
BENZODIAZEPINES: NOT DETECTED
Barbiturates: NOT DETECTED
Cocaine: POSITIVE — AB
OPIATES: POSITIVE — AB
Tetrahydrocannabinol: NOT DETECTED

## 2014-10-12 LAB — CBC WITH DIFFERENTIAL/PLATELET
Basophils Absolute: 0 10*3/uL (ref 0.0–0.1)
Basophils Relative: 0 % (ref 0–1)
Eosinophils Absolute: 0.1 10*3/uL (ref 0.0–0.7)
Eosinophils Relative: 1 % (ref 0–5)
HEMATOCRIT: 46.3 % (ref 39.0–52.0)
Hemoglobin: 15.8 g/dL (ref 13.0–17.0)
Lymphocytes Relative: 24 % (ref 12–46)
Lymphs Abs: 2.2 10*3/uL (ref 0.7–4.0)
MCH: 28.8 pg (ref 26.0–34.0)
MCHC: 34.1 g/dL (ref 30.0–36.0)
MCV: 84.5 fL (ref 78.0–100.0)
Monocytes Absolute: 0.8 10*3/uL (ref 0.1–1.0)
Monocytes Relative: 9 % (ref 3–12)
NEUTROS ABS: 6.2 10*3/uL (ref 1.7–7.7)
Neutrophils Relative %: 66 % (ref 43–77)
PLATELETS: 298 10*3/uL (ref 150–400)
RBC: 5.48 MIL/uL (ref 4.22–5.81)
RDW: 15.3 % (ref 11.5–15.5)
WBC: 9.4 10*3/uL (ref 4.0–10.5)

## 2014-10-12 LAB — I-STAT TROPONIN, ED
Troponin i, poc: 0.03 ng/mL (ref 0.00–0.08)
Troponin i, poc: 0.03 ng/mL (ref 0.00–0.08)

## 2014-10-12 LAB — BRAIN NATRIURETIC PEPTIDE: B Natriuretic Peptide: 887.7 pg/mL — ABNORMAL HIGH (ref 0.0–100.0)

## 2014-10-12 MED ORDER — MORPHINE SULFATE 4 MG/ML IJ SOLN
4.0000 mg | Freq: Once | INTRAMUSCULAR | Status: AC
  Administered 2014-10-12: 4 mg via INTRAVENOUS
  Filled 2014-10-12: qty 1

## 2014-10-12 MED ORDER — CYCLOBENZAPRINE HCL 10 MG PO TABS
10.0000 mg | ORAL_TABLET | Freq: Once | ORAL | Status: AC
  Administered 2014-10-12: 10 mg via ORAL
  Filled 2014-10-12: qty 1

## 2014-10-12 MED ORDER — OXYCODONE-ACETAMINOPHEN 5-325 MG PO TABS
1.0000 | ORAL_TABLET | Freq: Once | ORAL | Status: AC
  Administered 2014-10-12: 1 via ORAL
  Filled 2014-10-12: qty 1
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
## 2014-10-12 NOTE — ED Provider Notes (Signed)
3:24 PM Patient signed out to me by Sharilyn SitesLisa Sanders, PA-C. Patient pending delta troponin at 4:30pm. If negative, patient can be discharged per Dr. Sherlie BanMcClain with Cardiology.   5:30 PM Troponin is negative and patient can be discharged without further evaluation.   Results for orders placed or performed during the hospital encounter of 10/12/14  CBC with Differential  Result Value Ref Range   WBC 9.4 4.0 - 10.5 K/uL   RBC 5.48 4.22 - 5.81 MIL/uL   Hemoglobin 15.8 13.0 - 17.0 g/dL   HCT 16.146.3 09.639.0 - 04.552.0 %   MCV 84.5 78.0 - 100.0 fL   MCH 28.8 26.0 - 34.0 pg   MCHC 34.1 30.0 - 36.0 g/dL   RDW 40.915.3 81.111.5 - 91.415.5 %   Platelets 298 150 - 400 K/uL   Neutrophils Relative % 66 43 - 77 %   Neutro Abs 6.2 1.7 - 7.7 K/uL   Lymphocytes Relative 24 12 - 46 %   Lymphs Abs 2.2 0.7 - 4.0 K/uL   Monocytes Relative 9 3 - 12 %   Monocytes Absolute 0.8 0.1 - 1.0 K/uL   Eosinophils Relative 1 0 - 5 %   Eosinophils Absolute 0.1 0.0 - 0.7 K/uL   Basophils Relative 0 0 - 1 %   Basophils Absolute 0.0 0.0 - 0.1 K/uL  Basic metabolic panel  Result Value Ref Range   Sodium 141 135 - 145 mmol/L   Potassium 3.9 3.5 - 5.1 mmol/L   Chloride 107 96 - 112 mmol/L   CO2 25 19 - 32 mmol/L   Glucose, Bld 46 (L) 70 - 99 mg/dL   BUN 9 6 - 23 mg/dL   Creatinine, Ser 7.820.97 0.50 - 1.35 mg/dL   Calcium 9.3 8.4 - 95.610.5 mg/dL   GFR calc non Af Amer >90 >90 mL/min   GFR calc Af Amer >90 >90 mL/min   Anion gap 9 5 - 15  Brain natriuretic peptide  Result Value Ref Range   B Natriuretic Peptide 887.7 (H) 0.0 - 100.0 pg/mL  Urine rapid drug screen (hosp performed)  Result Value Ref Range   Opiates POSITIVE (A) NONE DETECTED   Cocaine POSITIVE (A) NONE DETECTED   Benzodiazepines NONE DETECTED NONE DETECTED   Amphetamines NONE DETECTED NONE DETECTED   Tetrahydrocannabinol NONE DETECTED NONE DETECTED   Barbiturates NONE DETECTED NONE DETECTED  I-stat troponin, ED  Result Value Ref Range   Troponin i, poc 0.03 0.00 - 0.08  ng/mL   Comment 3          I-stat troponin, ED  Result Value Ref Range   Troponin i, poc 0.03 0.00 - 0.08 ng/mL   Comment 3           Dg Chest 2 View  10/12/2014   CLINICAL DATA:  Severe chest pain with weakness and shortness of breath.  EXAM: CHEST  2 VIEW  COMPARISON:  08/31/2014.  FINDINGS: Patient is slightly rotated. View is apical lordotic. Trachea is grossly midline. Heart size stable. Minimal linear scarring at the right lung base. Lungs are otherwise clear. No pleural fluid.  IMPRESSION: No acute findings.   Electronically Signed   By: Leanna BattlesMelinda  Blietz M.D.   On: 10/12/2014 13:33      Emilia BeckKaitlyn Laketia Vicknair, PA-C 10/12/14 1733  Arby BarretteMarcy Pfeiffer, MD 10/13/14 [phone]

## 2014-10-12 NOTE — ED Notes (Signed)
Pt has returned from being out of the department; pt placed back on monitor, continuous pulse oximetry and blood pressure cuff 

## 2014-10-12 NOTE — ED Notes (Signed)
[facility] from [facility] with c/o central chest pain that is non-radiating x2 days. Pt is a spastic paraplegic and has chronic bag and BLE spasms/pain.  Pt is A&O4 and in NAD.

## 2014-10-12 NOTE — ED Notes (Signed)
Pt transported to Xray. 

## 2014-10-12 NOTE — ED Notes (Signed)
Patient comfortable with discharge and follow up instructions. No prescriptions. Pt requested Cab voucher and was given one.  Pt wheeled to waiting area and information regarding patient's ability to transfer to Cab, etc. Given to [employer] RN at [facility].

## 2014-10-12 NOTE — Discharge Instructions (Signed)
Follow up with your doctor for further evaluation. Refer to attached documents for more information.  °

## 2014-10-12 NOTE — ED Notes (Signed)
Pt undressed, in gown, on monitor, continuous pulse oximetry and blood pressure cuff 

## 2014-10-12 NOTE — ED Notes (Signed)
Pt reports last cocaine use was 1 week ago.

## 2014-10-21 ENCOUNTER — Encounter (HOSPITAL_COMMUNITY): Payer: Self-pay | Admitting: Emergency Medicine

## 2014-10-21 ENCOUNTER — Emergency Department (HOSPITAL_COMMUNITY)
Admission: EM | Admit: 2014-10-21 | Discharge: 2014-10-22 | Disposition: A | Discharge status: Home / Self Care | Payer: Medicaid Other | Attending: Emergency Medicine | Admitting: Emergency Medicine

## 2014-10-21 DIAGNOSIS — Z720 Tobacco use: Secondary | ICD-10-CM | POA: Diagnosis not present

## 2014-10-21 DIAGNOSIS — F329 Major depressive disorder, single episode, unspecified: Secondary | ICD-10-CM | POA: Diagnosis not present

## 2014-10-21 DIAGNOSIS — R079 Chest pain, unspecified: Secondary | ICD-10-CM | POA: Insufficient documentation

## 2014-10-21 DIAGNOSIS — G20 Parkinson's disease: Secondary | ICD-10-CM | POA: Diagnosis not present

## 2014-10-21 DIAGNOSIS — M79605 Pain in left leg: Secondary | ICD-10-CM | POA: Insufficient documentation

## 2014-10-21 DIAGNOSIS — Z8619 Personal history of other infectious and parasitic diseases: Secondary | ICD-10-CM | POA: Diagnosis not present

## 2014-10-21 DIAGNOSIS — I10 Essential (primary) hypertension: Secondary | ICD-10-CM | POA: Diagnosis not present

## 2014-10-21 DIAGNOSIS — M79602 Pain in left arm: Secondary | ICD-10-CM

## 2014-10-21 DIAGNOSIS — F419 Anxiety disorder, unspecified: Secondary | ICD-10-CM | POA: Insufficient documentation

## 2014-10-21 DIAGNOSIS — M1990 Unspecified osteoarthritis, unspecified site: Secondary | ICD-10-CM | POA: Diagnosis not present

## 2014-10-21 DIAGNOSIS — M79601 Pain in right arm: Secondary | ICD-10-CM

## 2014-10-21 DIAGNOSIS — M549 Dorsalgia, unspecified: Secondary | ICD-10-CM | POA: Diagnosis not present

## 2014-10-21 DIAGNOSIS — Z79899 Other long term (current) drug therapy: Secondary | ICD-10-CM | POA: Diagnosis not present

## 2014-10-21 DIAGNOSIS — I2510 Atherosclerotic heart disease of native coronary artery without angina pectoris: Secondary | ICD-10-CM | POA: Diagnosis not present

## 2014-10-21 DIAGNOSIS — Z791 Long term (current) use of non-steroidal anti-inflammatories (NSAID): Secondary | ICD-10-CM | POA: Insufficient documentation

## 2014-10-21 DIAGNOSIS — M79604 Pain in right leg: Secondary | ICD-10-CM | POA: Insufficient documentation

## 2014-10-21 DIAGNOSIS — Z7982 Long term (current) use of aspirin: Secondary | ICD-10-CM | POA: Diagnosis not present

## 2014-10-21 DIAGNOSIS — G8929 Other chronic pain: Secondary | ICD-10-CM | POA: Insufficient documentation

## 2014-10-21 MED ORDER — CYCLOBENZAPRINE HCL 10 MG PO TABS
10.0000 mg | ORAL_TABLET | Freq: Once | ORAL | Status: AC
  Administered 2014-10-22: 10 mg via ORAL
  Filled 2014-10-21: qty 1
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
## 2014-10-22 MED ORDER — CYCLOBENZAPRINE HCL 5 MG PO TABS: 5.0000 mg | ORAL_TABLET | Freq: Three times a day (TID) | ORAL | Status: DC | PRN

## 2014-10-22 NOTE — ED Notes (Signed)
[facility] with c/o chronic legs cramps, worse tonight, states run out of unprescribed oxycodone. Pt alerts and oriented x4 at this time, airway intact.

## 2014-10-22 NOTE — ED Notes (Signed)
[facility] unable to bring pt back to shelter, pt plan to give taxi voucher.

## 2014-10-22 NOTE — ED Notes (Signed)
Taxi voucher given 

## 2014-10-22 NOTE — ED Notes (Signed)
Pt made aware to return if symptoms worsen or if any life threatening symptoms occur.   

## 2014-10-26 ENCOUNTER — Encounter (HOSPITAL_COMMUNITY): Payer: Self-pay | Admitting: Emergency Medicine

## 2014-10-26 ENCOUNTER — Emergency Department (HOSPITAL_COMMUNITY)
Admission: EM | Admit: 2014-10-26 | Discharge: 2014-10-27 | Disposition: A | Discharge status: Home / Self Care | Payer: Medicaid Other | Attending: Emergency Medicine | Admitting: Emergency Medicine

## 2014-10-26 DIAGNOSIS — Z79899 Other long term (current) drug therapy: Secondary | ICD-10-CM | POA: Insufficient documentation

## 2014-10-26 DIAGNOSIS — R079 Chest pain, unspecified: Secondary | ICD-10-CM | POA: Diagnosis not present

## 2014-10-26 DIAGNOSIS — G8929 Other chronic pain: Secondary | ICD-10-CM

## 2014-10-26 DIAGNOSIS — G20 Parkinson's disease: Secondary | ICD-10-CM | POA: Diagnosis not present

## 2014-10-26 DIAGNOSIS — Z7982 Long term (current) use of aspirin: Secondary | ICD-10-CM | POA: Insufficient documentation

## 2014-10-26 DIAGNOSIS — F329 Major depressive disorder, single episode, unspecified: Secondary | ICD-10-CM | POA: Insufficient documentation

## 2014-10-26 DIAGNOSIS — Z8619 Personal history of other infectious and parasitic diseases: Secondary | ICD-10-CM | POA: Insufficient documentation

## 2014-10-26 DIAGNOSIS — I2510 Atherosclerotic heart disease of native coronary artery without angina pectoris: Secondary | ICD-10-CM | POA: Insufficient documentation

## 2014-10-26 DIAGNOSIS — Z9889 Other specified postprocedural states: Secondary | ICD-10-CM | POA: Diagnosis not present

## 2014-10-26 DIAGNOSIS — M545 Low back pain: Secondary | ICD-10-CM | POA: Diagnosis not present

## 2014-10-26 DIAGNOSIS — G8389 Other specified paralytic syndromes: Secondary | ICD-10-CM | POA: Diagnosis not present

## 2014-10-26 DIAGNOSIS — I10 Essential (primary) hypertension: Secondary | ICD-10-CM | POA: Insufficient documentation

## 2014-10-26 DIAGNOSIS — F419 Anxiety disorder, unspecified: Secondary | ICD-10-CM | POA: Diagnosis not present

## 2014-10-26 DIAGNOSIS — Z8639 Personal history of other endocrine, nutritional and metabolic disease: Secondary | ICD-10-CM | POA: Insufficient documentation

## 2014-10-26 DIAGNOSIS — Z720 Tobacco use: Secondary | ICD-10-CM | POA: Diagnosis not present

## 2014-10-26 DIAGNOSIS — M79606 Pain in leg, unspecified: Secondary | ICD-10-CM

## 2014-10-26 DIAGNOSIS — E780 Pure hypercholesterolemia: Secondary | ICD-10-CM | POA: Diagnosis not present

## 2014-10-26 DIAGNOSIS — M79605 Pain in left leg: Secondary | ICD-10-CM | POA: Diagnosis not present

## 2014-10-26 DIAGNOSIS — M79604 Pain in right leg: Secondary | ICD-10-CM | POA: Diagnosis not present

## 2014-10-26 DIAGNOSIS — Z791 Long term (current) use of non-steroidal anti-inflammatories (NSAID): Secondary | ICD-10-CM | POA: Insufficient documentation

## 2014-10-26 DIAGNOSIS — M1990 Unspecified osteoarthritis, unspecified site: Secondary | ICD-10-CM | POA: Insufficient documentation

## 2014-10-26 LAB — I-STAT CHEM 8, ED
BUN: 23 mg/dL (ref 6–23)
CALCIUM ION: 1.11 mmol/L — AB (ref 1.12–1.23)
CREATININE: 1 mg/dL (ref 0.50–1.35)
Chloride: 109 mmol/L (ref 96–112)
GLUCOSE: 98 mg/dL (ref 70–99)
HCT: 45 % (ref 39.0–52.0)
HEMOGLOBIN: 15.3 g/dL (ref 13.0–17.0)
Potassium: 4.2 mmol/L (ref 3.5–5.1)
Sodium: 141 mmol/L (ref 135–145)
TCO2: 16 mmol/L (ref 0–100)

## 2014-10-26 LAB — I-STAT TROPONIN, ED: Troponin i, poc: 0.04 ng/mL (ref 0.00–0.08)

## 2014-10-26 LAB — CBC WITH DIFFERENTIAL/PLATELET
Basophils Absolute: 0.1 K/uL (ref 0.0–0.1)
Basophils Relative: 1 % (ref 0–1)
Eosinophils Absolute: 0.2 K/uL (ref 0.0–0.7)
Eosinophils Relative: 2 % (ref 0–5)
HCT: 41.3 % (ref 39.0–52.0)
Hemoglobin: 14 g/dL (ref 13.0–17.0)
Lymphocytes Relative: 31 % (ref 12–46)
Lymphs Abs: 2.6 K/uL (ref 0.7–4.0)
MCH: 29.1 pg (ref 26.0–34.0)
MCHC: 33.9 g/dL (ref 30.0–36.0)
MCV: 85.9 fL (ref 78.0–100.0)
Monocytes Absolute: 0.5 K/uL (ref 0.1–1.0)
Monocytes Relative: 6 % (ref 3–12)
Neutro Abs: 5 K/uL (ref 1.7–7.7)
Neutrophils Relative %: 60 % (ref 43–77)
Platelets: 249 K/uL (ref 150–400)
RBC: 4.81 MIL/uL (ref 4.22–5.81)
RDW: 15.9 % — ABNORMAL HIGH (ref 11.5–15.5)
WBC: 8.4 K/uL (ref 4.0–10.5)

## 2014-10-26 MED ORDER — HYDROMORPHONE HCL 1 MG/ML IJ SOLN
1.0000 mg | Freq: Once | INTRAMUSCULAR | Status: AC
  Administered 2014-10-26: 1 mg via INTRAVENOUS
  Filled 2014-10-26: qty 1

## 2014-10-26 MED ORDER — NITROGLYCERIN 0.4 MG SL SUBL
0.4000 mg | SUBLINGUAL_TABLET | SUBLINGUAL | Status: DC | PRN
Start: 2014-10-26 — End: 2014-10-27
  Administered 2014-10-26: 0.4 mg via SUBLINGUAL
  Filled 2014-10-26: qty 1

## 2014-10-26 NOTE — ED Notes (Signed)
Dr. Ward at bedside.

## 2014-10-26 NOTE — ED Notes (Signed)
Patient transported to X-ray 
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
## 2014-10-26 NOTE — ED Notes (Signed)
Pt states he started having left sided chest this am around 0800. Denies n/v. Ems gave 325 mg Asprin and 1 SL nitro tablet. With no relief.

## 2014-10-27 ENCOUNTER — Emergency Department (HOSPITAL_COMMUNITY): Payer: Medicaid Other

## 2014-10-27 ENCOUNTER — Emergency Department (HOSPITAL_COMMUNITY)
Admission: EM | Admit: 2014-10-27 | Discharge: 2014-10-27 | Disposition: A | Discharge status: Home / Self Care | Payer: Medicaid Other | Source: Home / Self Care | Attending: Emergency Medicine | Admitting: Emergency Medicine

## 2014-10-27 ENCOUNTER — Encounter (HOSPITAL_COMMUNITY): Payer: Self-pay | Admitting: Emergency Medicine

## 2014-10-27 DIAGNOSIS — Z720 Tobacco use: Secondary | ICD-10-CM | POA: Insufficient documentation

## 2014-10-27 DIAGNOSIS — G8929 Other chronic pain: Secondary | ICD-10-CM | POA: Insufficient documentation

## 2014-10-27 DIAGNOSIS — M549 Dorsalgia, unspecified: Principal | ICD-10-CM

## 2014-10-27 DIAGNOSIS — Z8639 Personal history of other endocrine, nutritional and metabolic disease: Secondary | ICD-10-CM

## 2014-10-27 DIAGNOSIS — Z79899 Other long term (current) drug therapy: Secondary | ICD-10-CM

## 2014-10-27 DIAGNOSIS — M545 Low back pain: Secondary | ICD-10-CM | POA: Insufficient documentation

## 2014-10-27 DIAGNOSIS — Z8619 Personal history of other infectious and parasitic diseases: Secondary | ICD-10-CM

## 2014-10-27 DIAGNOSIS — F419 Anxiety disorder, unspecified: Secondary | ICD-10-CM

## 2014-10-27 DIAGNOSIS — Z7982 Long term (current) use of aspirin: Secondary | ICD-10-CM | POA: Insufficient documentation

## 2014-10-27 DIAGNOSIS — M1990 Unspecified osteoarthritis, unspecified site: Secondary | ICD-10-CM | POA: Insufficient documentation

## 2014-10-27 DIAGNOSIS — G20 Parkinson's disease: Secondary | ICD-10-CM | POA: Insufficient documentation

## 2014-10-27 DIAGNOSIS — F329 Major depressive disorder, single episode, unspecified: Secondary | ICD-10-CM

## 2014-10-27 DIAGNOSIS — Z791 Long term (current) use of non-steroidal anti-inflammatories (NSAID): Secondary | ICD-10-CM | POA: Insufficient documentation

## 2014-10-27 DIAGNOSIS — I2510 Atherosclerotic heart disease of native coronary artery without angina pectoris: Secondary | ICD-10-CM | POA: Insufficient documentation

## 2014-10-27 LAB — MAGNESIUM: Magnesium: 2.1 mg/dL (ref 1.5–2.5)

## 2014-10-27 LAB — BASIC METABOLIC PANEL
Anion gap: 11 (ref 5–15)
BUN: 19 mg/dL (ref 6–23)
CHLORIDE: 109 mmol/L (ref 96–112)
CO2: 18 mmol/L — ABNORMAL LOW (ref 19–32)
Calcium: 8.6 mg/dL (ref 8.4–10.5)
Creatinine, Ser: 0.96 mg/dL (ref 0.50–1.35)
GFR calc Af Amer: >90 mL/min (ref >90)
GLUCOSE: 98 mg/dL (ref 70–99)
POTASSIUM: 4.2 mmol/L (ref 3.5–5.1)
Sodium: 138 mmol/L (ref 135–145)

## 2014-10-27 LAB — RAPID URINE DRUG SCREEN, HOSP PERFORMED
AMPHETAMINES: NOT DETECTED
BARBITURATES: NOT DETECTED
Benzodiazepines: POSITIVE — AB
Cocaine: NOT DETECTED
Opiates: POSITIVE — AB
TETRAHYDROCANNABINOL: POSITIVE — AB

## 2014-10-27 LAB — ETHANOL: Alcohol, Ethyl (B): <5 mg/dL (ref 0–9)

## 2014-10-27 LAB — I-STAT TROPONIN, ED: Troponin i, poc: 0.05 ng/mL (ref 0.00–0.08)

## 2014-10-27 LAB — BRAIN NATRIURETIC PEPTIDE: B Natriuretic Peptide: 635.9 pg/mL — ABNORMAL HIGH (ref 0.0–100.0)

## 2014-10-27 LAB — SALICYLATE LEVEL

## 2014-10-27 MED ORDER — HYDROMORPHONE HCL 1 MG/ML IJ SOLN
1.0000 mg | Freq: Once | INTRAMUSCULAR | Status: AC
  Administered 2014-10-27: 1 mg via INTRAVENOUS
  Filled 2014-10-27: qty 1

## 2014-10-27 MED ORDER — METHOCARBAMOL 500 MG PO TABS: 500.0000 mg | ORAL_TABLET | Freq: Three times a day (TID) | ORAL | Status: AC | PRN

## 2014-10-27 MED ORDER — DIAZEPAM 5 MG PO TABS
5.0000 mg | ORAL_TABLET | Freq: Once | ORAL | Status: AC
  Administered 2014-10-27: 5 mg via ORAL
  Filled 2014-10-27: qty 1

## 2014-10-27 MED ORDER — DIAZEPAM 5 MG/ML IJ SOLN
2.5000 mg | Freq: Once | INTRAMUSCULAR | Status: AC
  Administered 2014-10-27: 2.5 mg via INTRAVENOUS
  Filled 2014-10-27: qty 2

## 2014-10-27 MED ORDER — RANOLAZINE ER 500 MG PO TB12: 500.0000 mg | ORAL_TABLET | Freq: Two times a day (BID) | ORAL | Status: AC

## 2014-10-27 MED ORDER — HYDROMORPHONE HCL 1 MG/ML IJ SOLN
1.0000 mg | Freq: Once | INTRAMUSCULAR | Status: AC
  Administered 2014-10-27: 1 mg via INTRAMUSCULAR
  Filled 2014-10-27: qty 1

## 2014-10-27 NOTE — ED Notes (Signed)
Bed: WA07 Expected date: 10/27/14 Expected time: 7:06 PM Means of arrival: Ambulance Comments: Chronic leg and back pain

## 2014-10-27 NOTE — Discharge Instructions (Signed)
Back Pain, Adult Low back pain is very common. About 1 in 5 people have back pain.The cause of low back pain is rarely dangerous. The pain often gets better over time.About half of people with a sudden onset of back pain feel better in just 2 weeks. About 8 in 10 people feel better by 6 weeks.  CAUSES Some common causes of back pain include:  Strain of the muscles or ligaments supporting the spine.  Wear and tear (degeneration) of the spinal discs.  Arthritis.  Direct injury to the back. DIAGNOSIS Most of the time, the direct cause of low back pain is not known.However, back pain can be treated effectively even when the exact cause of the pain is unknown.Answering your caregiver's questions about your overall health and symptoms is one of the most accurate ways to make sure the cause of your pain is not dangerous. If your caregiver needs more information, he or she may order lab work or imaging tests (X-rays or MRIs).However, even if imaging tests show changes in your back, this usually does not require surgery. HOME CARE INSTRUCTIONS For many people, back pain returns.Since low back pain is rarely dangerous, it is often a condition that people can learn to manageon their own.   Remain active. It is stressful on the back to sit or stand in one place. Do not sit, drive, or stand in one place for more than 30 minutes at a time. Take short walks on level surfaces as soon as pain allows.Try to increase the length of time you walk each day.  Do not stay in bed.Resting more than 1 or 2 days can delay your recovery.  Do not avoid exercise or work.Your body is made to move.It is not dangerous to be active, even though your back may hurt.Your back will likely heal faster if you return to being active before your pain is gone.  Pay attention to your body when you bend and lift. Many people have less discomfortwhen lifting if they bend their knees, keep the load close to their bodies,and  avoid twisting. Often, the most comfortable positions are those that put less stress on your recovering back.  Find a comfortable position to sleep. Use a firm mattress and lie on your side with your knees slightly bent. If you lie on your back, put a pillow under your knees.  Only take over-the-counter or prescription medicines as directed by your caregiver. Over-the-counter medicines to reduce pain and inflammation are often the most helpful.Your caregiver may prescribe muscle relaxant drugs.These medicines help dull your pain so you can more quickly return to your normal activities and healthy exercise.  Put ice on the injured area.  Put ice in a plastic bag.  Place a towel between your skin and the bag.  Leave the ice on for 15-20 minutes, 03-04 times a day for the first 2 to 3 days. After that, ice and heat may be alternated to reduce pain and spasms.  Ask your caregiver about trying back exercises and gentle massage. This may be of some benefit.  Avoid feeling anxious or stressed.Stress increases muscle tension and can worsen back pain.It is important to recognize when you are anxious or stressed and learn ways to manage it.Exercise is a great option. SEEK MEDICAL CARE IF:  You have pain that is not relieved with rest or medicine.  You have pain that does not improve in 1 week.  You have new symptoms.  You are generally not feeling well. SEEK   IMMEDIATE MEDICAL CARE IF:   You have pain that radiates from your back into your legs.  You develop new bowel or bladder control problems.  You have unusual weakness or numbness in your arms or legs.  You develop nausea or vomiting.  You develop abdominal pain.  You feel faint. Document Released: 08/23/2005 Document Revised: 02/22/2012 Document Reviewed: 12/25/2013 [employer] Patient Information 2015 [employer]. This information is not intended to replace advice given to you by your health care provider. Make sure you  discuss any questions you have with your health care provider.  

## 2014-10-27 NOTE — ED Notes (Addendum)
Pt given OJ and cab voucher

## 2014-10-27 NOTE — Discharge Instructions (Signed)
You were seen in the emergency department for chest pain and chronic lower extremity pain. We cannot discharge her home with narcotics for your chronic pain. You need to follow up with your pain management specialist in your PCP for this. I am discharging you with a prescription for muscle relaxers called Robaxin. Your cardiac labs have been unremarkable and we have discussed your care with the cardiologist on-call. They recommended increasing your Ranexa from 500 mg once daily to 500 mg twice daily. Please call your cardiologist to schedule an appointment in the next 1-2 weeks.   Chest Pain (Nonspecific) It is often hard to give a specific diagnosis for the cause of chest pain. There is always a chance that your pain could be related to something serious, such as a heart attack or a blood clot in the lungs. You need to follow up with your health care provider for further evaluation. CAUSES   Heartburn.  Pneumonia or bronchitis.  Anxiety or stress.  Inflammation around your heart (pericarditis) or lung (pleuritis or pleurisy).  A blood clot in the lung.  A collapsed lung (pneumothorax). It can develop suddenly on its own (spontaneous pneumothorax) or from trauma to the chest.  Shingles infection (herpes zoster virus). The chest wall is composed of bones, muscles, and cartilage. Any of these can be the source of the pain.  The bones can be bruised by injury.  The muscles or cartilage can be strained by coughing or overwork.  The cartilage can be affected by inflammation and become sore (costochondritis). DIAGNOSIS  Lab tests or other studies may be needed to find the cause of your pain. Your health care provider may have you take a test called an ambulatory electrocardiogram (ECG). An ECG records your heartbeat patterns over a 24-hour period. You may also have other tests, such as:  Transthoracic echocardiogram (TTE). During echocardiography, sound waves are used to evaluate how blood  flows through your heart.  Transesophageal echocardiogram (TEE).  Cardiac monitoring. This allows your health care provider to monitor your heart rate and rhythm in real time.  Holter monitor. This is a portable device that records your heartbeat and can help diagnose heart arrhythmias. It allows your health care provider to track your heart activity for several days, if needed.  Stress tests by exercise or by giving medicine that makes the heart beat faster. TREATMENT   Treatment depends on what may be causing your chest pain. Treatment may include:  Acid blockers for heartburn.  Anti-inflammatory medicine.  Pain medicine for inflammatory conditions.  Antibiotics if an infection is present.  You may be advised to change lifestyle habits. This includes stopping smoking and avoiding alcohol, caffeine, and chocolate.  You may be advised to keep your head raised (elevated) when sleeping. This reduces the chance of acid going backward from your stomach into your esophagus. Most of the time, nonspecific chest pain will improve within 2-3 days with rest and mild pain medicine.  HOME CARE INSTRUCTIONS   If antibiotics were prescribed, take them as directed. Finish them even if you start to feel better.  For the next few days, avoid physical activities that bring on chest pain. Continue physical activities as directed.  Do not use any tobacco products, including cigarettes, chewing tobacco, or electronic cigarettes.  Avoid drinking alcohol.  Only take medicine as directed by your health care provider.  Follow your health care provider's suggestions for further testing if your chest pain does not go away.  Keep any follow-up appointments  you made. If you do not go to an appointment, you could develop lasting (chronic) problems with pain. If there is any problem keeping an appointment, call to reschedule. SEEK MEDICAL CARE IF:   Your chest pain does not go away, even after  treatment.  You have a rash with blisters on your chest.  You have a fever. SEEK IMMEDIATE MEDICAL CARE IF:   You have increased chest pain or pain that spreads to your arm, neck, jaw, back, or abdomen.  You have shortness of breath.  You have an increasing cough, or you cough up blood.  You have severe back or abdominal pain.  You feel nauseous or vomit.  You have severe weakness.  You faint.  You have chills. This is an emergency. Do not wait to see if the pain will go away. Get medical help at once. Call your local emergency services (911 in [place] Do not drive yourself to the hospital. MAKE SURE YOU:   Understand these instructions.  Will watch your condition.  Will get help right away if you are not doing well or get worse. Document Released: 06/02/2005 Document Revised: 08/28/2013 Document Reviewed: 03/28/2008 [facility] Patient Information 2015 [place], [place]. This information is not intended to replace advice given to you by your health care provider. Make sure you discuss any questions you have with your health care provider.  Chronic Pain Chronic pain can be defined as pain that is off and on and lasts for 3-6 months or longer. Many things cause chronic pain, which can make it difficult to make a diagnosis. There are many treatment options available for chronic pain. However, finding a treatment that works well for you may require trying various approaches until the right one is found. Many people benefit from a combination of two or more types of treatment to control their pain. SYMPTOMS  Chronic pain can occur anywhere in the body and can range from mild to very severe. Some types of chronic pain include:  Headache.  Low back pain.  Cancer pain.  Arthritis pain.  Neurogenic pain. This is pain resulting from damage to nerves. People with chronic pain may also have other symptoms such as:  Depression.  Anger.  Insomnia.  Anxiety. DIAGNOSIS  Your  health care provider will help diagnose your condition over time. In many cases, the initial focus will be on excluding possible conditions that could be causing the pain. Depending on your symptoms, your health care provider may order tests to diagnose your condition. Some of these tests may include:   Blood tests.   CT scan.   MRI.   X-rays.   Ultrasounds.   Nerve conduction studies.  You may need to see a specialist.  TREATMENT  Finding treatment that works well may take time. You may be referred to a pain specialist. He or she may prescribe medicine or therapies, such as:   Mindful meditation or yoga.  Shots (injections) of numbing or pain-relieving medicines into the spine or area of pain.  Local electrical stimulation.  Acupuncture.   Massage therapy.   Aroma, color, light, or sound therapy.   Biofeedback.   Working with a physical therapist to keep from getting stiff.   Regular, gentle exercise.   Cognitive or behavioral therapy.   Group support.  Sometimes, surgery may be recommended.  HOME CARE INSTRUCTIONS   Take all medicines as directed by your health care provider.   Lessen stress in your life by relaxing and doing things such as  listening to calming music.   Exercise or be active as directed by your health care provider.   Eat a healthy diet and include things such as vegetables, fruits, fish, and lean meats in your diet.   Keep all follow-up appointments with your health care provider.   Attend a support group with others suffering from chronic pain. SEEK MEDICAL CARE IF:   Your pain gets worse.   You develop a new pain that was not there before.   You cannot tolerate medicines given to you by your health care provider.   You have new symptoms since your last visit with your health care provider.  SEEK IMMEDIATE MEDICAL CARE IF:   You feel weak.   You have decreased sensation or numbness.   You lose control of  bowel or bladder function.   Your pain suddenly gets much worse.   You develop shaking.  You develop chills.  You develop confusion.  You develop chest pain.  You develop shortness of breath.  MAKE SURE YOU:  Understand these instructions.  Will watch your condition.  Will get help right away if you are not doing well or get worse. Document Released: 05/15/2002 Document Revised: 04/25/2013 Document Reviewed: 02/16/2013 [facility] Patient Information 2015 [place]. This information is not intended to replace advice given to you by your health care provider. Make sure you discuss any questions you have with your health care provider.

## 2014-10-27 NOTE — ED Notes (Signed)
Pt arrived via EMS with c/o lower back and rt leg pain. Pt denies trauma/injury. (+)PMS, CRT brisk, and no deformity/bruising noted. Pt has resolved area to lt buttock from necrotizing fasciitis.
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
## 2014-10-28 ENCOUNTER — Encounter: Payer: Self-pay | Admitting: Internal Medicine

## 2014-10-28 ENCOUNTER — Other Ambulatory Visit: Payer: Self-pay | Admitting: Emergency Medicine

## 2014-10-28 MED ORDER — ATORVASTATIN CALCIUM 80 MG PO TABS: 80.0000 mg | ORAL_TABLET | Freq: Every day | ORAL | Status: DC

## 2014-10-28 MED ORDER — CYCLOBENZAPRINE HCL 5 MG PO TABS: 5.0000 mg | ORAL_TABLET | Freq: Three times a day (TID) | ORAL | Status: DC | PRN

## 2014-10-28 MED ORDER — ACETAMINOPHEN-CODEINE #3 300-30 MG PO TABS: 1.0000 | ORAL_TABLET | ORAL | Status: DC | PRN

## 2014-11-02 ENCOUNTER — Emergency Department (HOSPITAL_COMMUNITY): Payer: Medicaid Other

## 2014-11-02 ENCOUNTER — Inpatient Hospital Stay (HOSPITAL_COMMUNITY)
Admission: EM | Admit: 2014-11-02 | Discharge: 2014-11-05 | DRG: 918 | Disposition: A | Discharge status: Home / Self Care | Payer: Medicaid Other | Attending: Infectious Disease | Admitting: Infectious Disease

## 2014-11-02 ENCOUNTER — Encounter (HOSPITAL_COMMUNITY): Payer: Self-pay | Admitting: Cardiology

## 2014-11-02 DIAGNOSIS — F1120 Opioid dependence, uncomplicated: Secondary | ICD-10-CM | POA: Diagnosis present

## 2014-11-02 DIAGNOSIS — Z79899 Other long term (current) drug therapy: Secondary | ICD-10-CM | POA: Diagnosis not present

## 2014-11-02 DIAGNOSIS — G8310 Monoplegia of lower limb affecting unspecified side: Secondary | ICD-10-CM | POA: Diagnosis present

## 2014-11-02 DIAGNOSIS — T50902A Poisoning by unspecified drugs, medicaments and biological substances, intentional self-harm, initial encounter: Secondary | ICD-10-CM | POA: Diagnosis present

## 2014-11-02 DIAGNOSIS — Z7982 Long term (current) use of aspirin: Secondary | ICD-10-CM

## 2014-11-02 DIAGNOSIS — I2510 Atherosclerotic heart disease of native coronary artery without angina pectoris: Secondary | ICD-10-CM | POA: Diagnosis present

## 2014-11-02 DIAGNOSIS — I255 Ischemic cardiomyopathy: Secondary | ICD-10-CM | POA: Diagnosis present

## 2014-11-02 DIAGNOSIS — G8220 Paraplegia, unspecified: Secondary | ICD-10-CM | POA: Diagnosis present

## 2014-11-02 DIAGNOSIS — G894 Chronic pain syndrome: Secondary | ICD-10-CM | POA: Diagnosis present

## 2014-11-02 DIAGNOSIS — I10 Essential (primary) hypertension: Secondary | ICD-10-CM | POA: Diagnosis present

## 2014-11-02 DIAGNOSIS — T50992A Poisoning by other drugs, medicaments and biological substances, intentional self-harm, initial encounter: Secondary | ICD-10-CM | POA: Diagnosis not present

## 2014-11-02 DIAGNOSIS — W06XXXA Fall from bed, initial encounter: Secondary | ICD-10-CM | POA: Diagnosis present

## 2014-11-02 DIAGNOSIS — Z5900 Homelessness unspecified: Secondary | ICD-10-CM

## 2014-11-02 DIAGNOSIS — F1910 Other psychoactive substance abuse, uncomplicated: Secondary | ICD-10-CM | POA: Diagnosis present

## 2014-11-02 DIAGNOSIS — I5042 Chronic combined systolic (congestive) and diastolic (congestive) heart failure: Secondary | ICD-10-CM | POA: Diagnosis present

## 2014-11-02 DIAGNOSIS — K7030 Alcoholic cirrhosis of liver without ascites: Secondary | ICD-10-CM | POA: Diagnosis present

## 2014-11-02 DIAGNOSIS — F17210 Nicotine dependence, cigarettes, uncomplicated: Secondary | ICD-10-CM | POA: Diagnosis present

## 2014-11-02 DIAGNOSIS — B1920 Unspecified viral hepatitis C without hepatic coma: Secondary | ICD-10-CM | POA: Diagnosis present

## 2014-11-02 DIAGNOSIS — Z993 Dependence on wheelchair: Secondary | ICD-10-CM | POA: Diagnosis not present

## 2014-11-02 DIAGNOSIS — F319 Bipolar disorder, unspecified: Secondary | ICD-10-CM | POA: Diagnosis present

## 2014-11-02 DIAGNOSIS — IMO0002 Reserved for concepts with insufficient information to code with codable children: Secondary | ICD-10-CM | POA: Diagnosis present

## 2014-11-02 DIAGNOSIS — R45851 Suicidal ideations: Secondary | ICD-10-CM | POA: Diagnosis present

## 2014-11-02 DIAGNOSIS — M25552 Pain in left hip: Secondary | ICD-10-CM | POA: Diagnosis present

## 2014-11-02 DIAGNOSIS — F1010 Alcohol abuse, uncomplicated: Secondary | ICD-10-CM | POA: Diagnosis present

## 2014-11-02 DIAGNOSIS — W19XXXA Unspecified fall, initial encounter: Secondary | ICD-10-CM

## 2014-11-02 LAB — COMPREHENSIVE METABOLIC PANEL
ALBUMIN: 3.8 g/dL (ref 3.5–5.2)
ALT: 9 U/L (ref 0–53)
AST: 17 U/L (ref 0–37)
Alkaline Phosphatase: 75 U/L (ref 39–117)
Anion gap: 9 (ref 5–15)
BILIRUBIN TOTAL: 0.4 mg/dL (ref 0.3–1.2)
BUN: 19 mg/dL (ref 6–23)
CHLORIDE: 108 mmol/L (ref 96–112)
CO2: 23 mmol/L (ref 19–32)
Calcium: 9 mg/dL (ref 8.4–10.5)
Creatinine, Ser: 0.88 mg/dL (ref 0.50–1.35)
GFR calc Af Amer: >90 mL/min (ref >90)
GFR calc non Af Amer: >90 mL/min (ref >90)
Glucose, Bld: 88 mg/dL (ref 70–99)
Potassium: 3.8 mmol/L (ref 3.5–5.1)
Sodium: 140 mmol/L (ref 135–145)
TOTAL PROTEIN: 7.2 g/dL (ref 6.0–8.3)

## 2014-11-02 LAB — I-STAT CHEM 8, ED
BUN: 22 mg/dL (ref 6–23)
Calcium, Ion: 1.15 mmol/L (ref 1.12–1.23)
Chloride: 107 mmol/L (ref 96–112)
Creatinine, Ser: 1 mg/dL (ref 0.50–1.35)
GLUCOSE: 85 mg/dL (ref 70–99)
HCT: 50 % (ref 39.0–52.0)
Hemoglobin: 17 g/dL (ref 13.0–17.0)
Potassium: 3.9 mmol/L (ref 3.5–5.1)
Sodium: 143 mmol/L (ref 135–145)
TCO2: 18 mmol/L (ref 0–100)

## 2014-11-02 LAB — CBC WITH DIFFERENTIAL/PLATELET
Basophils Absolute: 0.1 10*3/uL (ref 0.0–0.1)
Basophils Relative: 1 % (ref 0–1)
EOS PCT: 2 % (ref 0–5)
Eosinophils Absolute: 0.2 10*3/uL (ref 0.0–0.7)
HCT: 44.3 % (ref 39.0–52.0)
Hemoglobin: 14.9 g/dL (ref 13.0–17.0)
LYMPHS ABS: 3.3 10*3/uL (ref 0.7–4.0)
Lymphocytes Relative: 36 % (ref 12–46)
MCH: 29.2 pg (ref 26.0–34.0)
MCHC: 33.6 g/dL (ref 30.0–36.0)
MCV: 86.7 fL (ref 78.0–100.0)
Monocytes Absolute: 0.7 10*3/uL (ref 0.1–1.0)
Monocytes Relative: 8 % (ref 3–12)
NEUTROS PCT: 54 % (ref 43–77)
Neutro Abs: 5 10*3/uL (ref 1.7–7.7)
PLATELETS: 325 10*3/uL (ref 150–400)
RBC: 5.11 MIL/uL (ref 4.22–5.81)
RDW: 16 % — ABNORMAL HIGH (ref 11.5–15.5)
WBC: 9.2 10*3/uL (ref 4.0–10.5)

## 2014-11-02 LAB — PROTIME-INR
INR: 1.03 (ref 0.00–1.49)
Prothrombin Time: 13.6 seconds (ref 11.6–15.2)

## 2014-11-02 LAB — ACETAMINOPHEN LEVEL

## 2014-11-02 LAB — SALICYLATE LEVEL

## 2014-11-02 MED ORDER — SODIUM CHLORIDE 0.9 % IV BOLUS (SEPSIS)
1000.0000 mL | Freq: Once | INTRAVENOUS | Status: AC
  Administered 2014-11-02: 1000 mL via INTRAVENOUS

## 2014-11-02 MED ORDER — NICOTINE 21 MG/24HR TD PT24
21.0000 mg | MEDICATED_PATCH | Freq: Every day | TRANSDERMAL | Status: DC
  Administered 2014-11-03 – 2014-11-05 (×4): 21 mg via TRANSDERMAL
  Filled 2014-11-02 (×5): qty 1

## 2014-11-02 MED ORDER — ACTIDOSE WITH SORBITOL 50 GM/240ML PO LIQD
50.0000 g | Freq: Once | ORAL | Status: AC
  Administered 2014-11-02: 50 g via ORAL
  Filled 2014-11-02: qty 240
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
## 2014-11-02 NOTE — ED Notes (Signed)
Pt to department via EMS- pt reports that he is paraplegic but able to move legs. Pt reports he fell today from the bed when he was trying to stand. Reports left hip pain, no deformity noted. Bp-130 pal Hr-72

## 2014-11-02 NOTE — ED Notes (Signed)
MetallurgistCharge tech called staffing for a sitter, Naresh Althaus called security to wand pt.

## 2014-11-03 ENCOUNTER — Inpatient Hospital Stay (HOSPITAL_COMMUNITY): Payer: Medicaid Other

## 2014-11-03 ENCOUNTER — Other Ambulatory Visit (HOSPITAL_COMMUNITY): Payer: Self-pay

## 2014-11-03 DIAGNOSIS — I10 Essential (primary) hypertension: Secondary | ICD-10-CM

## 2014-11-03 DIAGNOSIS — K7460 Unspecified cirrhosis of liver: Secondary | ICD-10-CM

## 2014-11-03 DIAGNOSIS — F1910 Other psychoactive substance abuse, uncomplicated: Secondary | ICD-10-CM

## 2014-11-03 DIAGNOSIS — I5042 Chronic combined systolic (congestive) and diastolic (congestive) heart failure: Secondary | ICD-10-CM

## 2014-11-03 DIAGNOSIS — W19XXXA Unspecified fall, initial encounter: Secondary | ICD-10-CM | POA: Diagnosis present

## 2014-11-03 DIAGNOSIS — I2510 Atherosclerotic heart disease of native coronary artery without angina pectoris: Secondary | ICD-10-CM

## 2014-11-03 DIAGNOSIS — T50902A Poisoning by unspecified drugs, medicaments and biological substances, intentional self-harm, initial encounter: Secondary | ICD-10-CM

## 2014-11-03 DIAGNOSIS — M25552 Pain in left hip: Secondary | ICD-10-CM

## 2014-11-03 DIAGNOSIS — T1491 Suicide attempt: Secondary | ICD-10-CM

## 2014-11-03 DIAGNOSIS — G8220 Paraplegia, unspecified: Secondary | ICD-10-CM

## 2014-11-03 LAB — COMPREHENSIVE METABOLIC PANEL
ALBUMIN: 3.7 g/dL (ref 3.5–5.2)
ALBUMIN: 3.6 g/dL (ref 3.5–5.2)
ALT: 10 U/L (ref 0–53)
ALT: 10 U/L (ref 0–53)
ALT: 9 U/L (ref 0–53)
ANION GAP: 7 (ref 5–15)
ANION GAP: 10 (ref 5–15)
AST: 18 U/L (ref 0–37)
AST: 15 U/L (ref 0–37)
AST: 18 U/L (ref 0–37)
Albumin: 3.6 g/dL (ref 3.5–5.2)
Alkaline Phosphatase: 72 U/L (ref 39–117)
Alkaline Phosphatase: 69 U/L (ref 39–117)
Alkaline Phosphatase: 73 U/L (ref 39–117)
Anion gap: 9 (ref 5–15)
BILIRUBIN TOTAL: 0.8 mg/dL (ref 0.3–1.2)
BILIRUBIN TOTAL: 0.6 mg/dL (ref 0.3–1.2)
BUN: 19 mg/dL (ref 6–23)
BUN: 16 mg/dL (ref 6–23)
BUN: 17 mg/dL (ref 6–23)
CALCIUM: 8.7 mg/dL (ref 8.4–10.5)
CO2: 18 mmol/L — ABNORMAL LOW (ref 19–32)
CO2: 19 mmol/L (ref 19–32)
CO2: 18 mmol/L — AB (ref 19–32)
CREATININE: 0.92 mg/dL (ref 0.50–1.35)
Calcium: 8.5 mg/dL (ref 8.4–10.5)
Calcium: 8.5 mg/dL (ref 8.4–10.5)
Chloride: 113 mmol/L — ABNORMAL HIGH (ref 96–112)
Chloride: 111 mmol/L (ref 96–112)
Chloride: 110 mmol/L (ref 96–112)
Creatinine, Ser: 0.77 mg/dL (ref 0.50–1.35)
Creatinine, Ser: 0.73 mg/dL (ref 0.50–1.35)
GFR calc Af Amer: >90 mL/min (ref >90)
GFR calc Af Amer: >90 mL/min (ref >90)
GFR calc Af Amer: >90 mL/min (ref >90)
GFR calc non Af Amer: >90 mL/min (ref >90)
GFR calc non Af Amer: >90 mL/min (ref >90)
GLUCOSE: 159 mg/dL — AB (ref 70–99)
Glucose, Bld: 95 mg/dL (ref 70–99)
Glucose, Bld: 92 mg/dL (ref 70–99)
POTASSIUM: 4.8 mmol/L (ref 3.5–5.1)
POTASSIUM: 3.7 mmol/L (ref 3.5–5.1)
Potassium: 4.2 mmol/L (ref 3.5–5.1)
SODIUM: 138 mmol/L (ref 135–145)
SODIUM: 139 mmol/L (ref 135–145)
Sodium: 138 mmol/L (ref 135–145)
TOTAL PROTEIN: 6.6 g/dL (ref 6.0–8.3)
TOTAL PROTEIN: 6.5 g/dL (ref 6.0–8.3)
Total Bilirubin: 0.6 mg/dL (ref 0.3–1.2)
Total Protein: 6.7 g/dL (ref 6.0–8.3)

## 2014-11-03 LAB — URINALYSIS, ROUTINE W REFLEX MICROSCOPIC
BILIRUBIN URINE: NEGATIVE
GLUCOSE, UA: NEGATIVE mg/dL
HGB URINE DIPSTICK: NEGATIVE
Ketones, ur: NEGATIVE mg/dL
Leukocytes, UA: NEGATIVE
Nitrite: NEGATIVE
PH: 6 (ref 5.0–8.0)
PROTEIN: 30 mg/dL — AB
Specific Gravity, Urine: 1.019 (ref 1.005–1.030)
Urobilinogen, UA: 0.2 mg/dL (ref 0.0–1.0)

## 2014-11-03 LAB — CBC
HEMATOCRIT: 45.1 % (ref 39.0–52.0)
Hemoglobin: 15.2 g/dL (ref 13.0–17.0)
MCH: 29.5 pg (ref 26.0–34.0)
MCHC: 33.7 g/dL (ref 30.0–36.0)
MCV: 87.4 fL (ref 78.0–100.0)
PLATELETS: 301 10*3/uL (ref 150–400)
RBC: 5.16 MIL/uL (ref 4.22–5.81)
RDW: 16.1 % — AB (ref 11.5–15.5)
WBC: 8.4 10*3/uL (ref 4.0–10.5)

## 2014-11-03 LAB — CBG MONITORING, ED
GLUCOSE-CAPILLARY: 81 mg/dL (ref 70–99)
GLUCOSE-CAPILLARY: 91 mg/dL (ref 70–99)
GLUCOSE-CAPILLARY: 86 mg/dL (ref 70–99)
Glucose-Capillary: 93 mg/dL (ref 70–99)

## 2014-11-03 LAB — TROPONIN I
Troponin I: 0.05 ng/mL — ABNORMAL HIGH (ref <0.031)
Troponin I: 0.05 ng/mL — ABNORMAL HIGH (ref <0.031)
Troponin I: 0.03 ng/mL (ref <0.031)

## 2014-11-03 LAB — GLUCOSE, CAPILLARY
GLUCOSE-CAPILLARY: 121 mg/dL — AB (ref 70–99)
Glucose-Capillary: 111 mg/dL — ABNORMAL HIGH (ref 70–99)

## 2014-11-03 LAB — URINE MICROSCOPIC-ADD ON

## 2014-11-03 LAB — RAPID URINE DRUG SCREEN, HOSP PERFORMED
AMPHETAMINES: NOT DETECTED
Barbiturates: NOT DETECTED
Benzodiazepines: POSITIVE — AB
Cocaine: NOT DETECTED
OPIATES: NOT DETECTED
Tetrahydrocannabinol: POSITIVE — AB

## 2014-11-03 LAB — MRSA PCR SCREENING: MRSA by PCR: POSITIVE — AB

## 2014-11-03 MED ORDER — LORAZEPAM 2 MG/ML IJ SOLN
1.0000 mg | Freq: Four times a day (QID) | INTRAMUSCULAR | Status: DC | PRN
  Administered 2014-11-03: 1 mg via INTRAVENOUS
  Filled 2014-11-03: qty 1

## 2014-11-03 MED ORDER — FENTANYL 25 MCG/HR TD PT72
25.0000 ug | MEDICATED_PATCH | TRANSDERMAL | Status: DC
  Administered 2014-11-03: 25 ug via TRANSDERMAL
  Filled 2014-11-03: qty 1

## 2014-11-03 MED ORDER — ATORVASTATIN CALCIUM 80 MG PO TABS
80.0000 mg | ORAL_TABLET | Freq: Every day | ORAL | Status: DC
  Administered 2014-11-03 – 2014-11-05 (×3): 80 mg via ORAL
  Filled 2014-11-03 (×4): qty 1

## 2014-11-03 MED ORDER — THIAMINE HCL 100 MG/ML IJ SOLN
100.0000 mg | Freq: Every day | INTRAMUSCULAR | Status: DC
  Filled 2014-11-03: qty 1

## 2014-11-03 MED ORDER — OXYCODONE-ACETAMINOPHEN 5-325 MG PO TABS
2.0000 | ORAL_TABLET | Freq: Four times a day (QID) | ORAL | Status: DC | PRN
  Administered 2014-11-03: 2 via ORAL
  Filled 2014-11-03: qty 2

## 2014-11-03 MED ORDER — ACETAMINOPHEN 500 MG PO TABS
500.0000 mg | ORAL_TABLET | Freq: Three times a day (TID) | ORAL | Status: DC | PRN
  Administered 2014-11-03: 500 mg via ORAL
  Filled 2014-11-03: qty 1

## 2014-11-03 MED ORDER — LORAZEPAM 1 MG PO TABS
1.0000 mg | ORAL_TABLET | Freq: Four times a day (QID) | ORAL | Status: DC | PRN
  Administered 2014-11-03 – 2014-11-05 (×3): 1 mg via ORAL
  Filled 2014-11-03 (×3): qty 1

## 2014-11-03 MED ORDER — CYCLOBENZAPRINE HCL 5 MG PO TABS
5.0000 mg | ORAL_TABLET | Freq: Once | ORAL | Status: AC
  Administered 2014-11-03: 5 mg via ORAL
  Filled 2014-11-03: qty 1

## 2014-11-03 MED ORDER — OXYCODONE-ACETAMINOPHEN 5-325 MG PO TABS
1.0000 | ORAL_TABLET | Freq: Four times a day (QID) | ORAL | Status: DC | PRN
Start: 2014-11-03 — End: 2014-11-03
  Administered 2014-11-03: 1 via ORAL
  Filled 2014-11-03: qty 1

## 2014-11-03 MED ORDER — DICLOFENAC SODIUM 1 % TD GEL
2.0000 g | Freq: Four times a day (QID) | TRANSDERMAL | Status: DC | PRN
  Administered 2014-11-04 – 2014-11-05 (×3): 2 g via TOPICAL
  Filled 2014-11-03 (×2): qty 100

## 2014-11-03 MED ORDER — FOLIC ACID 1 MG PO TABS
1.0000 mg | ORAL_TABLET | Freq: Every day | ORAL | Status: DC
  Administered 2014-11-03 – 2014-11-05 (×3): 1 mg via ORAL
  Filled 2014-11-03 (×3): qty 1

## 2014-11-03 MED ORDER — M.V.I. ADULT IV INJ
INJECTION | Freq: Once | INTRAVENOUS | Status: AC
  Administered 2014-11-03: 02:00:00 via INTRAVENOUS
  Filled 2014-11-03: qty 1000

## 2014-11-03 MED ORDER — ADULT MULTIVITAMIN W/MINERALS CH
1.0000 | ORAL_TABLET | Freq: Every day | ORAL | Status: DC
  Administered 2014-11-03 – 2014-11-05 (×3): 1 via ORAL
  Filled 2014-11-03 (×3): qty 1

## 2014-11-03 MED ORDER — SODIUM CHLORIDE 0.9 % IJ SOLN
3.0000 mL | Freq: Two times a day (BID) | INTRAMUSCULAR | Status: DC
  Administered 2014-11-03 – 2014-11-05 (×5): 3 mL via INTRAVENOUS

## 2014-11-03 MED ORDER — ASPIRIN EC 81 MG PO TBEC
81.0000 mg | DELAYED_RELEASE_TABLET | Freq: Every day | ORAL | Status: DC
  Administered 2014-11-03 – 2014-11-05 (×3): 81 mg via ORAL
  Filled 2014-11-03 (×3): qty 1

## 2014-11-03 MED ORDER — HEPARIN SODIUM (PORCINE) 5000 UNIT/ML IJ SOLN
5000.0000 [IU] | Freq: Three times a day (TID) | INTRAMUSCULAR | Status: DC
  Administered 2014-11-03 – 2014-11-05 (×7): 5000 [IU] via SUBCUTANEOUS
  Filled 2014-11-03 (×10): qty 1

## 2014-11-03 MED ORDER — VITAMIN B-1 100 MG PO TABS
100.0000 mg | ORAL_TABLET | Freq: Every day | ORAL | Status: DC
  Administered 2014-11-03 – 2014-11-05 (×3): 100 mg via ORAL
  Filled 2014-11-03 (×3): qty 1

## 2014-11-03 MED ORDER — OXYCODONE HCL 5 MG PO TABS
10.0000 mg | ORAL_TABLET | ORAL | Status: DC | PRN
  Administered 2014-11-03 – 2014-11-05 (×8): 10 mg via ORAL
  Filled 2014-11-03 (×9): qty 2

## 2014-11-03 NOTE — ED Notes (Signed)
Talked with Poison Control about patient status, no change at this time. Vital signs stable.
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
## 2014-11-03 NOTE — ED Notes (Signed)
CBG 86. 

## 2014-11-03 NOTE — Progress Notes (Signed)
Went to see patient after RN called stating he is in pain. The percocet 2tablets q6hr prn is not lasting long enough. Patient states pain is chronic on both knees, legs, and back. He states taking 25 fentanyl patch, 10mg  oxycontin TID for maintenance, 10 percocet 3 tabs on a daily basis. I could not verify this. He also states taking robaxin BID. He is not able to get comfortable because of the pain.   Exam: General: appears uncomfortable lying on his side on the bed. Appears calm otherwise. Lungs: ctab Heart: rrr, no m/r/g Ext: has ttp on both knees and legs. Filed Vitals:   11/03/14 1936  BP: 129/70  Pulse: 77  Temp: 98.1 F (36.7 C)  Resp: 16     A/p: Chronic pain - I cannot verify his home dose. He also came in with suicide attempt so will have to be careful with pain meds. - I will change percocet 2 tabs to oxycodone IR 10mg  q4 hrs PRN for now. - vitals stable.

## 2014-11-03 NOTE — Progress Notes (Signed)
ED CM noted patient to have had 12 ED visits with 3 admissions. [facility], Patient presented to ED c/o of pain, while in waiting room ingested handful of pills witnessed by triage nurse.Patient is for admission.  Patient is homeless, will place SW consult. Patient's PCP is [facility] will notify CM for f[facility].

## 2014-11-03 NOTE — ED Notes (Signed)
CBG-91. Notified RN 

## 2014-11-03 NOTE — ED Notes (Signed)
Informed admitting MD about pts second trop. No changes in care plan

## 2014-11-03 NOTE — ED Notes (Signed)
Pt having continual black/charcoal diarrhea, flexseal tube inserted, draining liquid black stool.

## 2014-11-03 NOTE — ED Notes (Signed)
Pt CBG result was 93. Informed RN.

## 2014-11-03 NOTE — ED Notes (Signed)
Pt moans whenever moving, rectal tube in place.

## 2014-11-03 NOTE — ED Notes (Signed)
Ordered Diet Tray 

## 2014-11-04 ENCOUNTER — Encounter (HOSPITAL_COMMUNITY): Payer: Self-pay | Admitting: Cardiology

## 2014-11-04 DIAGNOSIS — F17210 Nicotine dependence, cigarettes, uncomplicated: Secondary | ICD-10-CM

## 2014-11-04 DIAGNOSIS — K7030 Alcoholic cirrhosis of liver without ascites: Secondary | ICD-10-CM

## 2014-11-04 DIAGNOSIS — Z590 Homelessness: Secondary | ICD-10-CM

## 2014-11-04 DIAGNOSIS — T6592XA Toxic effect of unspecified substance, intentional self-harm, initial encounter: Secondary | ICD-10-CM

## 2014-11-04 DIAGNOSIS — F1010 Alcohol abuse, uncomplicated: Secondary | ICD-10-CM

## 2014-11-04 LAB — COMPREHENSIVE METABOLIC PANEL
ALBUMIN: 3.4 g/dL — AB (ref 3.5–5.2)
ALK PHOS: 70 U/L (ref 39–117)
ALT: 9 U/L (ref 0–53)
ALT: 9 U/L (ref 0–53)
AST: 15 U/L (ref 0–37)
AST: 15 U/L (ref 0–37)
Albumin: 3.5 g/dL (ref 3.5–5.2)
Alkaline Phosphatase: 66 U/L (ref 39–117)
Anion gap: 7 (ref 5–15)
Anion gap: 12 (ref 5–15)
BILIRUBIN TOTAL: 0.5 mg/dL (ref 0.3–1.2)
BUN: 15 mg/dL (ref 6–23)
BUN: 14 mg/dL (ref 6–23)
CALCIUM: 9.2 mg/dL (ref 8.4–10.5)
CHLORIDE: 109 mmol/L (ref 96–112)
CO2: 22 mmol/L (ref 19–32)
CO2: 18 mmol/L — AB (ref 19–32)
CREATININE: 0.78 mg/dL (ref 0.50–1.35)
Calcium: 8.7 mg/dL (ref 8.4–10.5)
Chloride: 109 mmol/L (ref 96–112)
Creatinine, Ser: 0.82 mg/dL (ref 0.50–1.35)
GFR calc Af Amer: >90 mL/min (ref >90)
GFR calc Af Amer: >90 mL/min (ref >90)
GFR calc non Af Amer: >90 mL/min (ref >90)
Glucose, Bld: 93 mg/dL (ref 70–99)
Glucose, Bld: 97 mg/dL (ref 70–99)
POTASSIUM: 3.8 mmol/L (ref 3.5–5.1)
Potassium: 3.9 mmol/L (ref 3.5–5.1)
Sodium: 138 mmol/L (ref 135–145)
Sodium: 139 mmol/L (ref 135–145)
TOTAL PROTEIN: 6.4 g/dL (ref 6.0–8.3)
Total Bilirubin: 0.3 mg/dL (ref 0.3–1.2)
Total Protein: 6.5 g/dL (ref 6.0–8.3)

## 2014-11-04 LAB — GLUCOSE, CAPILLARY
GLUCOSE-CAPILLARY: 111 mg/dL — AB (ref 70–99)
GLUCOSE-CAPILLARY: 103 mg/dL — AB (ref 70–99)
Glucose-Capillary: 103 mg/dL — ABNORMAL HIGH (ref 70–99)
Glucose-Capillary: 100 mg/dL — ABNORMAL HIGH (ref 70–99)
Glucose-Capillary: 89 mg/dL (ref 70–99)
Glucose-Capillary: 86 mg/dL (ref 70–99)

## 2014-11-04 LAB — LACTIC ACID, PLASMA: Lactic Acid, Venous: 0.8 mmol/L (ref 0.5–2.0)

## 2014-11-04 MED ORDER — CHLORHEXIDINE GLUCONATE CLOTH 2 % EX PADS
6.0000 | MEDICATED_PAD | Freq: Every day | CUTANEOUS | Status: DC
  Administered 2014-11-04 – 2014-11-05 (×2): 6 via TOPICAL

## 2014-11-04 MED ORDER — METHOCARBAMOL 500 MG PO TABS
500.0000 mg | ORAL_TABLET | Freq: Three times a day (TID) | ORAL | Status: DC | PRN
  Administered 2014-11-04 – 2014-11-05 (×3): 500 mg via ORAL
  Filled 2014-11-04 (×5): qty 1

## 2014-11-04 MED ORDER — MUPIROCIN 2 % EX OINT
1.0000 "application " | TOPICAL_OINTMENT | Freq: Two times a day (BID) | CUTANEOUS | Status: DC
  Administered 2014-11-04 – 2014-11-05 (×3): 1 via NASAL
  Filled 2014-11-04: qty 22

## 2014-11-04 MED ORDER — MUPIROCIN CALCIUM 2 % EX CREA
TOPICAL_CREAM | Freq: Every day | CUTANEOUS | Status: DC
  Filled 2014-11-04: qty 15

## 2014-11-04 NOTE — Clinical Social Work Note (Signed)
CSW Consult Acknowledged:   CSW received a consult for homeless. The [facility] Per [facility] policy the pt can hold a bed if a staff member contact the [facility] daily to inform them that the pt is still receiving treatment.     CSW [facility] the [facility] at [phone] to informed them that the pt is still inpatient status.     Mauria Asquith, MSW, LCSWA [phone]
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
## 2014-11-04 NOTE — Consult Note (Addendum)
WOC wound consult note Reason for Consult: Consult requested for left hip wound. Wound type: Chronic wound which pt states will heal, then re-occur over site where he had hardware removed several years ago.   Pressure Ulcer POA: This is NOT a pressure ulcer; it is located over previous surgical incision. Measurement:2.5X2.5X.1cm, no depth when probed with swab. Wound bed: Dried scabbed area of partial thickness wound Drainage (amount, consistency, odor) Small amt dried red drainage in patchy area Periwound:v Deep crevice where bone rubs against skin when he is out of bed, according to patient. Dressing procedure/placement/frequency: Bactroban to promote moist healing.  Foam dressing to protect from further injury. Please re-consult if further assistance is needed.  Thank-you,  Cammie Mcgeeawn Kahlen Morais MSN, RN, CWOCN, ClarkWCN-AP, CNS [phone]

## 2014-11-04 NOTE — Progress Notes (Signed)
Utilization Review Completed.Zayneb Baucum T2/29/2016  
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
## 2014-11-05 LAB — CBC WITH DIFFERENTIAL/PLATELET
BASOS ABS: 0.1 10*3/uL (ref 0.0–0.1)
Basophils Relative: 1 % (ref 0–1)
EOS PCT: 2 % (ref 0–5)
Eosinophils Absolute: 0.2 10*3/uL (ref 0.0–0.7)
HCT: 44 % (ref 39.0–52.0)
Hemoglobin: 14.5 g/dL (ref 13.0–17.0)
Lymphocytes Relative: 35 % (ref 12–46)
Lymphs Abs: 2.6 10*3/uL (ref 0.7–4.0)
MCH: 28.9 pg (ref 26.0–34.0)
MCHC: 33 g/dL (ref 30.0–36.0)
MCV: 87.6 fL (ref 78.0–100.0)
Monocytes Absolute: 0.5 10*3/uL (ref 0.1–1.0)
Monocytes Relative: 7 % (ref 3–12)
Neutro Abs: 4.1 10*3/uL (ref 1.7–7.7)
Neutrophils Relative %: 55 % (ref 43–77)
PLATELETS: 249 10*3/uL (ref 150–400)
RBC: 5.02 MIL/uL (ref 4.22–5.81)
RDW: 15.8 % — AB (ref 11.5–15.5)
WBC: 7.4 10*3/uL (ref 4.0–10.5)

## 2014-11-05 LAB — COMPREHENSIVE METABOLIC PANEL
ALT: 10 U/L (ref 0–53)
ANION GAP: 11 (ref 5–15)
AST: 17 U/L (ref 0–37)
Albumin: 3.6 g/dL (ref 3.5–5.2)
Alkaline Phosphatase: 70 U/L (ref 39–117)
BILIRUBIN TOTAL: 0.6 mg/dL (ref 0.3–1.2)
BUN: 16 mg/dL (ref 6–23)
CALCIUM: 8.9 mg/dL (ref 8.4–10.5)
CHLORIDE: 104 mmol/L (ref 96–112)
CO2: 23 mmol/L (ref 19–32)
CREATININE: 0.84 mg/dL (ref 0.50–1.35)
GFR calc non Af Amer: >90 mL/min (ref >90)
Glucose, Bld: 94 mg/dL (ref 70–99)
Potassium: 4 mmol/L (ref 3.5–5.1)
SODIUM: 138 mmol/L (ref 135–145)
Total Protein: 6.4 g/dL (ref 6.0–8.3)

## 2014-11-05 LAB — GLUCOSE, CAPILLARY: GLUCOSE-CAPILLARY: 80 mg/dL (ref 70–99)

## 2014-11-05 MED ORDER — OXYCODONE HCL 5 MG PO TABS
10.0000 mg | ORAL_TABLET | ORAL | Status: DC | PRN
  Administered 2014-11-05 (×3): 10 mg via ORAL
  Filled 2014-11-05 (×2): qty 2

## 2014-11-05 MED ORDER — OXYCODONE HCL 10 MG PO TABS: 10.0000 mg | ORAL_TABLET | ORAL | Status: DC | PRN

## 2014-11-05 MED ORDER — FENTANYL 25 MCG/HR TD PT72
25.0000 ug | MEDICATED_PATCH | TRANSDERMAL | Status: DC
Start: 2014-11-05 — End: 2014-12-04

## 2014-11-05 MED ORDER — DULOXETINE HCL 60 MG PO CPEP
60.0000 mg | ORAL_CAPSULE | Freq: Every day | ORAL | Status: DC
Start: 2014-11-05 — End: 2014-11-05
  Administered 2014-11-05: 60 mg via ORAL
  Filled 2014-11-05: qty 1
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
## 2014-11-05 NOTE — Progress Notes (Signed)
CARE MANAGEMENT NOTE 11/05/2014  Patient:  Jeffrey [name],Jeffrey [name]   Account Number:  [id number]  Date Initiated:  11/05/2014  Documentation initiated by:  Donn PieriniWEBSTER,Lorra Freeman  Subjective/Objective Assessment:   Pt admitted with attempted suicide     Action/Plan:   [facility]   Anticipated DC Date:  11/05/2014   Anticipated DC Plan:  HOME/SELF CARE      DC Planning Services  CM consult  Indigent Health Clinic      Choice offered to / List presented to:             Status of service:  Completed, signed off Medicare Important Message given?  NO (If response is "NO", the following Medicare IM given date fields will be blank) Date Medicare IM given:   Medicare IM given by:   Date Additional Medicare IM given:   Additional Medicare IM given by:    Discharge Disposition:  HOME/SELF CARE  Per UR Regulation:  Reviewed for med. necessity/level of care/duration of stay  If discussed at Long Length of Stay Meetings, dates discussed:    Comments:  11/05/14- 1330- Donn PieriniKristi Olivea Sonnen RN, BSN [phone] Received call from MD regarding PCP- [facility] Dr. Hyman HopesJegede- call made to pt'[name] room- per pt he has an appointment coming up on Mar. 8- encouraged pt to keep that appointment with PCP. no further CM needs noted.

## 2014-11-05 NOTE — Progress Notes (Addendum)
Pt c/o of sharp and aching pain 8/10 in hip, back, and lower legs. Pt not due for more pain medicine until 0300. MD notified. No new orders received.  11/05/14 0140 Applied Voltaren gel. Pt is now agitated and yelling at this RN. He is now having suicidal ideations stating, "I'm going to blow my brains out" and "when I get out of here, I'm going to take 70 percocet and see if you all care, then". Pt has removed EKG leads and refuses to have them replaced. Additional environmental check performed. Monitor is on stand-by mode. MD notified and says he will be up here shortly. CMT notified of why monitor is on stand-by mode.  11/05/14 0200 MD at bedside. Frequency of 10mg  oxycodone PO increased to Q3H. Dose given and pt hooked back up to monitor. Will continue to monitor.

## 2014-11-05 NOTE — Progress Notes (Signed)
Jeffrey Bonitoean S Fierro to be D/C'd Home ([facility]) per MD order.  Discussed with the patient and all questions fully answered.    Medication List    STOP taking these medications        acetaminophen-codeine 300-30 MG per tablet  Commonly known as:  TYLENOL #3     oxyCODONE-acetaminophen 10-325 MG per tablet  Commonly known as:  PERCOCET      TAKE these medications        acetaminophen 500 MG tablet  Commonly known as:  TYLENOL  Take 1,000 mg by mouth every 6 (six) hours as needed.     amiodarone 400 MG tablet  Commonly known as:  PACERONE  Take 1 tablet (400 mg total) by mouth daily.     aspirin 81 MG EC tablet  Take 1 tablet (81 mg total) by mouth daily.     atorvastatin 80 MG tablet  Commonly known as:  LIPITOR  Take 1 tablet (80 mg total) by mouth daily.     BC HEADACHE PO  Take 1 packet by mouth 2 (two) times daily as needed (headache).     carvedilol 3.125 MG tablet  Commonly known as:  COREG  Take 1 tablet (3.125 mg total) by mouth 2 (two) times daily with a meal.     cyclobenzaprine 5 MG tablet  Commonly known as:  FLEXERIL  Take 1 tablet (5 mg total) by mouth 3 (three) times daily as needed for muscle spasms.     DULoxetine 60 MG capsule  Commonly known as:  CYMBALTA  Take 1 capsule (60 mg total) by mouth daily. For depression/pain control     fentaNYL 25 MCG/HR patch  Commonly known as:  DURAGESIC - dosed mcg/hr  Place 1 patch (25 mcg total) onto the skin every 3 (three) days.     gabapentin 100 MG capsule  Commonly known as:  NEURONTIN  Take 1 capsule (100 mg total) by mouth 3 (three) times daily.     methocarbamol 500 MG tablet  Commonly known as:  ROBAXIN  Take 1 tablet (500 mg total) by mouth every 8 (eight) hours as needed for muscle spasms.     mexiletine 200 MG capsule  Commonly known as:  MEXITIL  Take 1 capsule (200 mg total) by mouth every 12 (twelve) hours.     OLANZapine 10 MG tablet  Commonly known as:  ZYPREXA   Take 1 tablet (10 mg total) by mouth at bedtime.     Oxycodone HCl 10 MG Tabs  Take 1 tablet (10 mg total) by mouth every 4 (four) hours as needed for moderate pain or severe pain.     ranolazine 500 MG 12 hr tablet  Commonly known as:  RANEXA  Take 1 tablet (500 mg total) by mouth 2 (two) times daily.        VVS, Skin clean, dry and intact without evidence of skin break down, no evidence of skin tears noted. IV catheter discontinued intact. Site without signs and symptoms of complications. Dressing and pressure applied.  An After Visit Summary was printed and given to the patient.  D/c education completed with patient/family including follow up instructions, medication list, d/c activities limitations if indicated, with other d/c instructions as indicated by MD - patient able to verbalize understanding, all questions fully answered.   Patient instructed to return to ED, call 911, or call MD for any changes in condition.   Patient escorted via WC, and [facility] with [facility] voucher.  [facility]do [facility] 11/05/2014 1:27 PM
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
## 2014-11-05 NOTE — Clinical Social Work Note (Addendum)
CSW spoke with Debbe BalesBrook, Tax inspectorintern at [facility] at [phone] to informed them that the pt is still inpatient status.Brook reported that she will informed the rest of the stuff.    Addendum: CSW arranged transport for the [facility] to [facility]. CSW spoke with Sunny SchleinFelicia to inform her that the pt was discharged from the hospital.   Gwynne EdingerDysheka Darivs Lunden, MSW, [place]CSWA [phone]

## 2014-11-05 NOTE — Discharge Instructions (Signed)
We have given you a 2 week supply of pain medications to get you through until your next appointment with [facility]. You can use place a Fentanyl patch every 3 days and you can take oxycodone  every 4 hours as needed for pain. Stop taking tylenol 3 while you are on those pain medications.   Oxycodone tablets or capsules What is this medicine? OXYCODONE (ox i KOE done) is a pain reliever. It is used to treat moderate to severe pain. This medicine may be used for other purposes; ask your health care provider or pharmacist if you have questions. COMMON BRAND NAME(S): Dazidox, Endocodone, OXECTA, OxyIR, Percolone, Roxicodone What should I tell my health care provider before I take this medicine? They need to know if you have any of these conditions: -Addison's disease -brain tumor -drug abuse or addiction -head injury -heart disease -if you frequently drink alcohol containing drinks -kidney disease or problems going to the bathroom -liver disease -lung disease, asthma, or breathing problems -mental problems -an unusual or allergic reaction to oxycodone, codeine, hydrocodone, morphine, other medicines, foods, dyes, or preservatives -pregnant or trying to get pregnant -breast-feeding How should I use this medicine? Take this medicine by mouth with a glass of water. Follow the directions on the prescription label. You can take it with or without food. If it upsets your stomach, take it with food. Take your medicine at regular intervals. Do not take it more often than directed. Do not stop taking except on your doctor's advice. Some brands of this medicine, like Oxecta, have special instructions. Ask your doctor or pharmacist if these directions are for you: Do not cut, crush or chew this medicine. Swallow only one tablet at a time. Do not wet, soak, or lick the tablet before you take it. Talk to your pediatrician regarding the use of this medicine in children. Special care  may be needed. Overdosage: If you think you have taken too much of this medicine contact a poison control center or emergency room at once. NOTE: This medicine is only for you. Do not share this medicine with others. What if I miss a dose? If you miss a dose, take it as soon as you can. If it is almost time for your next dose, take only that dose. Do not take double or extra doses. What may interact with this medicine? -alcohol -antihistamines -certain medicines used for nausea like chlorpromazine, droperidol -erythromycin -ketoconazole -medicines for depression, anxiety, or psychotic disturbances -medicines for sleep -muscle relaxants -naloxone -naltrexone -narcotic medicines (opiates) for pain -nilotinib -phenobarbital -phenytoin -rifampin -ritonavir -voriconazole This list may not describe all possible interactions. Give your health care provider a list of all the medicines, herbs, non-prescription drugs, or dietary supplements you use. Also tell them if you smoke, drink alcohol, or use illegal drugs. Some items may interact with your medicine. What should I watch for while using this medicine? Tell your doctor or health care professional if your pain does not go away, if it gets worse, or if you have new or a different type of pain. You may develop tolerance to the medicine. Tolerance means that you will need a higher dose of the medicine for pain relief. Tolerance is normal and is expected if you take this medicine for a long time. Do not suddenly stop taking your medicine because you may develop a severe reaction. Your body becomes used to the medicine. This does NOT mean you are addicted. Addiction is a behavior related to  getting and using a drug for a non-medical reason. If you have pain, you have a medical reason to take pain medicine. Your doctor will tell you how much medicine to take. If your doctor wants you to stop the medicine, the dose will be slowly lowered over time to  avoid any side effects. You may get drowsy or dizzy when you first start taking this medicine or change doses. Do not drive, use machinery, or do anything that may be dangerous until you know how the medicine affects you. Stand or sit up slowly. There are different types of narcotic medicines (opiates) for pain. If you take more than one type at the same time, you may have more side effects. Give your health care provider a list of all medicines you use. Your doctor will tell you how much medicine to take. Do not take more medicine than directed. Call emergency for help if you have problems breathing. This medicine will cause constipation. Try to have a bowel movement at least every 2 to 3 days. If you do not have a bowel movement for 3 days, call your doctor or health care professional. Your mouth may get dry. Drinking water, chewing sugarless gum, or sucking on hard candy may help. See your dentist every 6 months. What side effects may I notice from receiving this medicine? Side effects that you should report to your doctor or health care professional as soon as possible: -allergic reactions like skin rash, itching or hives, swelling of the face, lips, or tongue -breathing problems -confusion -feeling faint or lightheaded, falls -trouble passing urine or change in the amount of urine -unusually weak or tired Side effects that usually do not require medical attention (report to your doctor or health care professional if they continue or are bothersome): -constipation -dry mouth -itching -nausea, vomiting -upset stomach This list may not describe all possible side effects. Call your doctor for medical advice about side effects. You may report side effects to FDA at 1-800-FDA-1088. Where should I keep my medicine? Keep out of the reach of children. This medicine can be abused. Keep your medicine in a safe place to protect it from theft. Do not share this medicine with anyone. Selling or giving  away this medicine is dangerous and against the law. Store at room temperature between 15 and 30 degrees C (59 and 86 degrees F). Protect from light. Keep container tightly closed. This medicine may cause accidental overdose and death if it is taken by other adults, children, or pets. Flush any unused medicine down the toilet to reduce the chance of harm. Do not use the medicine after the expiration date. NOTE: This sheet is a summary. It may not cover all possible information. If you have questions about this medicine, talk to your doctor, pharmacist, or health care provider.  2015, Elsevier/Gold Standard. (2013-05-03 13:43:33)   fentanyl skin patch What is this medicine? FENTANYL (FEN ta nil) is a pain reliever. It is used to treat persistent, moderate to severe chronic pain. It is used only by people who have been taking an opioid or narcotic pain medicine for more than one week. This medicine may be used for other purposes; ask your health care provider or pharmacist if you have questions. COMMON BRAND NAME(S): Duragesic What should I tell my health care provider before I take this medicine? They need to know if you have any of these conditions: -brain tumor -Crohn's disease, inflammatory bowel disease, or ulcerative colitis -drug abuse or addiction -head  injury -heart disease -if you frequently drink alcohol containing drinks -kidney disease or problems going to the bathroom -liver disease -lung disease, asthma, or breathing problems -mental problems -skin problems -taken isocarboxazid, phenelzine, tranylcypromine, or selegiline in the past 2 weeks -an allergic or unusual reaction to fentanyl, meperidine, other medicines, foods, dyes, or preservatives -pregnant or trying to get pregnant -breast-feeding How should I use this medicine? Apply the patch to your skin. Do not cut or damage the patch. A cut or damaged patch can be very dangerous because you may get too much medicine.  Select a clean, dry area of skin above your waist on your front or back. The upper back is a good spot to put the patch on children or people who are confused because it will be hard for them to remove the patch. Do not apply the patch to oily, broken, burned, cut, or irritated skin. Use only water to clean the area. Do not use soap or alcohol to clean the skin because this can increase the effects of the medicine. If the area is hairy, clip the hair with scissors, but do not shave. Take the patch out of its wrapper, and take off the protective strip over the sticky part. Do not use a patch if the packaging or backing is damaged. Do not touch the sticky part with your fingers. Press the sticky surface to the skin using the palm of your hand. Press the patch to the skin for 30 seconds. Wash your hands at once with soap and water. Keep patches far away from children. Do not let children see you apply the patch and do not apply it where children can see it. Do not call the patch a sticker, tattoo, or bandage, as this could encourage the child to mimic your actions. Used patches still contain medicine. Children or pets can have serious side effects or die from putting used patches in their mouth or on their bodies. Take off the old patch before putting on a new patch. Apply each new patch to a different area of skin. If a patch comes off or causes irritation, remove it and apply a new patch to different site. If the edges of the patch start to loosen, first apply first aid tape to the edges of the patch. If problems with the patch not sticking continue, cover the patch with a see-through adhesive dressing (like Bioclusive or Tegaderm). Never cover the patch with any other bandage or tape. To get rid of used patches, fold the patch in half with the sticky sides together. Then, flush it down the toilet. Do not discard the patch in the garbage. Pets and children can be harmed if they find used or lost patches. Replace  the patch every 3 days or as directed by your doctor or health care professional. Follow the directions on the prescription label. Do not take more medicine than you are told to take. A special MedGuide will be given to you by the pharmacist with each prescription and refill. Be sure to read this information carefully each time. Talk to your pediatrician regarding the use of this medicine in children. While this drug may be prescribed for children as young as 75 years old for selected conditions, precautions do apply. If someone accidentally uses a fentanyl patch and is not awake and alert, immediately call 911 for help. If the person is awake and alert, call a doctor, health care professional, or the [employer]. Overdosage: If you think you  have taken too much of this medicine contact a poison control center or emergency room at once. NOTE: This medicine is only for you. Do not share this medicine with others. What if I miss a dose? If you forget to replace your patch, take off the old patch and put on a new patch as soon as you can. Do not apply an extra patch to your skin. Do not wear more than one patch at the same time unless told to do so by your doctor or health care professional. What may interact with this medicine? -alcohol -antihistamines -clarithromycin -diltiazem -erythromycin -grapefruit juice -herbal products that contain St. John's wort -itraconazole -ketoconazole -medicines for depression, anxiety, or psychotic disturbances -medicines for sleep -muscle relaxants -naltrexone -narcotic medicines (opiates) for pain -nelfinavir -nicardipine -phenobarbital, phenytoin, or fosphenytoin -rifampin -ritonavir -troleandomycin -verapamil This list may not describe all possible interactions. Give your health care provider a list of all the medicines, herbs, non-prescription drugs, or dietary supplements you use. Also tell them if you smoke, drink alcohol, or use illegal  drugs. Some items may interact with your medicine. What should I watch for while using this medicine? Other pain medicine may be needed the first day you use the patch because the patch can take some time to start working. Tell your doctor or health care professional if your pain does not go away, if it gets worse, or if you have new or a different type of pain. You may develop tolerance to the medicine. Tolerance means that you will need a higher dose of the medicine for pain relief. Tolerance is normal and is expected if you take the medicine for a long time. Have a family member watch you for side effects during the first day you wear a patch or if your dose changes. Do not suddenly stop taking your medicine because you may develop a severe reaction. Your body becomes used to the medicine. This does NOT mean you are addicted. Addiction is a behavior related to getting and using a drug for a non-medical reason. If you have pain, you have a medical reason to take pain medicine. Your doctor will tell you how much medicine to take. If your doctor wants you to stop the medicine, the dose will be slowly lowered over time to avoid any side effects. You may get drowsy or dizzy when you first start taking the medicine or change doses. Do not drive, use machinery, or do anything that may be dangerous until you know how the medicine affects you. Stand or sit up slowly. There are different types of narcotic medicines (opiates) for pain. If you take more than one type at the same time, you may have more side effects. Give your health care provider a list of all medicines you use. Your doctor will tell you how much medicine to take. Do not take more medicine than directed. Call emergency for help if you have problems breathing. The medicine will cause constipation. Try to have a bowel movement at least every 2 to 3 days. If you do not have a bowel movement for 3 days, call your doctor or health care professional. Your  mouth may get dry. Drinking water, chewing sugarless gum, or sucking on hard candy may help. See your dentist every 6 months. Heat can increase the amount of medicine released from the patch. Do not get the patch hot by using heating pads, heated water beds, electric blankets, and heat lamps. You can bathe or swim while using the  patch. But, do not use a sauna or hot tub. Tell you doctor or health care professional if you get a fever. If gel leaks from the patch, wash your skin well with water only. Do not use soap or cleansers containing alcohol. Remove the patch from your skin and discard as instructed above. Medicine from a partly attached or leaking patch can transfer to a child or pet when they are being held. Exposure of children or pets to the patch can cause serious side effects and even death. If you are going to have a magnetic resonance imaging (MRI) procedure, tell your MRI technician if you have this patch on your body. It must be removed before a MRI. What side effects may I notice from receiving this medicine? Side effects that you should report to your doctor or health care professional as soon as possible: -allergic reactions like skin rash, itching or hives, swelling of the face, lips, or tongue -breathing problems -changes in vision -confusion -feeling faint, lightheaded -fever, flu-like symptoms -hallucination -high or low blood pressure -irregular heartbeat -problems with balance, talking, walking -trouble passing urine or change in the amount of urine -unusual bleeding or bruising -unusually weak or tired Side effects that usually do not require medical attention (report to your doctor or health care professional if they continue or are bothersome): -constipation -dry mouth -itching where the patch was applied -loss of appetite -nausea, vomiting -sweating This list may not describe all possible side effects. Call your doctor for medical advice about side effects. You  may report side effects to FDA at 1-800-FDA-1088. Where should I keep my medicine? Keep out of the reach of children. This medicine can be abused. Keep your medicine in a safe place to protect it from theft. Do not share this medicine with anyone. Selling or giving away this medicine is dangerous and against the law. Store below 77 degrees F (25 degrees C). Do not store the patches out of their wrappers. This medicine may cause accidental overdose and death if it is taken by other adults, children, or pets. Flush any unused medicine down the toilet as instructed above to reduce the chance of harm. Do not use the medicine after the expiration date. NOTE: This sheet is a summary. It may not cover all possible information. If you have questions about this medicine, talk to your doctor, pharmacist, or health care provider.  2015, Elsevier/Gold Standard. (2013-03-21 17:25:22)

## 2014-11-05 NOTE — Progress Notes (Signed)
Went to see patient because of his uncontrolled leg/back pain. He was already on oxy IR 10mg  q4hr + robaxin q8hr + fentanyl patch 25. He still continues to have pain. Patient was being verbally abusive to the staff earlier for not giving him more pain meds. When I arrived patient was frustrated and crying out of frustration from pain. He states when he goes home if he is in pain he may just buy the pain meds illegally since no one is giving him his pain meds. He threatened to hurt himself earlier to RN if we don't control his pain. I explained to him that we are trying to work with him but we have to be careful about pain meds. He earlier disconnected all monitors. Patient agreed to go back on monitor.   Exam:  General: in pain, frustrated Lungs: CTAB Ext: has pain on both legs, contracted, paraplegic.  Vitals reviewed.   A/p:  Chronic leg/back pain - will increase frequency of Oxy IR 10 mg to q3hr. Have to be careful about pain control and patient's safety as he has hx of polysubstance abuse. - will ask day team to come up with a good pain regimen for him, knowing it will be difficult.

## 2014-11-06 LAB — HIV ANTIBODY (ROUTINE TESTING W REFLEX): HIV SCREEN 4TH GENERATION: NONREACTIVE

## 2014-11-12 ENCOUNTER — Ambulatory Visit: Payer: Self-pay | Admitting: Internal Medicine

## 2014-11-13 ENCOUNTER — Encounter (HOSPITAL_COMMUNITY): Payer: Self-pay | Admitting: Emergency Medicine

## 2014-11-13 ENCOUNTER — Emergency Department (HOSPITAL_COMMUNITY)
Admission: EM | Admit: 2014-11-13 | Discharge: 2014-11-14 | Disposition: A | Discharge status: Home / Self Care | Payer: Medicaid Other | Attending: Emergency Medicine | Admitting: Emergency Medicine

## 2014-11-13 DIAGNOSIS — Z8619 Personal history of other infectious and parasitic diseases: Secondary | ICD-10-CM | POA: Insufficient documentation

## 2014-11-13 DIAGNOSIS — F329 Major depressive disorder, single episode, unspecified: Secondary | ICD-10-CM | POA: Insufficient documentation

## 2014-11-13 DIAGNOSIS — M545 Low back pain: Secondary | ICD-10-CM | POA: Insufficient documentation

## 2014-11-13 DIAGNOSIS — I2510 Atherosclerotic heart disease of native coronary artery without angina pectoris: Secondary | ICD-10-CM | POA: Diagnosis not present

## 2014-11-13 DIAGNOSIS — G8929 Other chronic pain: Secondary | ICD-10-CM | POA: Diagnosis not present

## 2014-11-13 DIAGNOSIS — I10 Essential (primary) hypertension: Secondary | ICD-10-CM | POA: Insufficient documentation

## 2014-11-13 DIAGNOSIS — Z9889 Other specified postprocedural states: Secondary | ICD-10-CM | POA: Insufficient documentation

## 2014-11-13 DIAGNOSIS — Z720 Tobacco use: Secondary | ICD-10-CM | POA: Insufficient documentation

## 2014-11-13 DIAGNOSIS — E669 Obesity, unspecified: Secondary | ICD-10-CM | POA: Insufficient documentation

## 2014-11-13 DIAGNOSIS — Z79899 Other long term (current) drug therapy: Secondary | ICD-10-CM | POA: Insufficient documentation

## 2014-11-13 DIAGNOSIS — G20 Parkinson's disease: Secondary | ICD-10-CM | POA: Diagnosis not present

## 2014-11-13 DIAGNOSIS — F419 Anxiety disorder, unspecified: Secondary | ICD-10-CM | POA: Diagnosis not present

## 2014-11-13 DIAGNOSIS — Z7982 Long term (current) use of aspirin: Secondary | ICD-10-CM | POA: Insufficient documentation

## 2014-11-13 DIAGNOSIS — E780 Pure hypercholesterolemia: Secondary | ICD-10-CM | POA: Diagnosis not present

## 2014-11-13 DIAGNOSIS — M1990 Unspecified osteoarthritis, unspecified site: Secondary | ICD-10-CM | POA: Diagnosis not present

## 2014-11-13 MED ORDER — HYDROMORPHONE HCL 1 MG/ML IJ SOLN
1.0000 mg | Freq: Once | INTRAMUSCULAR | Status: AC
  Administered 2014-11-13: 1 mg via INTRAMUSCULAR
  Filled 2014-11-13: qty 1

## 2014-11-13 NOTE — Discharge Instructions (Signed)
The emergency department is NOT appropriate for the management of chronic pain!!  You must follow-up with your primary care doctor to obtain any additional prescriptions. [facility] tomorrow, or any day during the week as it is open from 9am until 5pm. If you require transport, call [phone] or [phone] to coordinate someone to bring you to your appointment. Recommend that you call your doctor to make an appointment as soon as possible. Also recommend that you discuss referral to a pain management clinic with your primary doctor.  Chronic Back Pain  When back pain lasts longer than 3 months, it is called chronic back pain.People with chronic back pain often go through certain periods that are more intense (flare-ups).  CAUSES Chronic back pain can be caused by wear and tear (degeneration) on different structures in your back. These structures include:  The bones of your spine (vertebrae) and the joints surrounding your spinal cord and nerve roots (facets).  The strong, fibrous tissues that connect your vertebrae (ligaments). Degeneration of these structures may result in pressure on your nerves. This can lead to constant pain. HOME CARE INSTRUCTIONS  Avoid bending, heavy lifting, prolonged sitting, and activities which make the problem worse.  Take brief periods of rest throughout the day to reduce your pain. Lying down or standing usually is better than sitting while you are resting.  Take over-the-counter or prescription medicines only as directed by your caregiver. SEEK IMMEDIATE MEDICAL CARE IF:   You have weakness or numbness in one of your legs or feet.  You have trouble controlling your bladder or bowels.  You have nausea, vomiting, abdominal pain, shortness of breath, or fainting. Document Released: 09/30/2004 Document Revised: 11/15/2011 Document Reviewed: 08/07/2011 [facility] Patient Information 2015 [place] [place]. This  information is not intended to replace advice given to you by your health care provider. Make sure you discuss any questions you have with your health care provider.  Chronic Pain Chronic pain can be defined as pain that is off and on and lasts for 3-6 months or longer. Many things cause chronic pain, which can make it difficult to make a diagnosis. There are many treatment options available for chronic pain. However, finding a treatment that works well for you may require trying various approaches until the right one is found. Many people benefit from a combination of two or more types of treatment to control their pain. SYMPTOMS  Chronic pain can occur anywhere in the body and can range from mild to very severe. Some types of chronic pain include:  Headache.  Low back pain.  Cancer pain.  Arthritis pain.  Neurogenic pain. This is pain resulting from damage to nerves. People with chronic pain may also have other symptoms such as:  Depression.  Anger.  Insomnia.  Anxiety. DIAGNOSIS  Your health care provider will help diagnose your condition over time. In many cases, the initial focus will be on excluding possible conditions that could be causing the pain. Depending on your symptoms, your health care provider may order tests to diagnose your condition. Some of these tests may include:   Blood tests.   CT scan.   MRI.   X-rays.   Ultrasounds.   Nerve conduction studies.  You may need to see a specialist.  TREATMENT  Finding treatment that works well may take time. You may be referred to a pain specialist. He or she may prescribe medicine or therapies, such as:  Mindful meditation or yoga.  Shots (injections) of numbing or pain-relieving medicines into the spine or area of pain.  Local electrical stimulation.  Acupuncture.   Massage therapy.   Aroma, color, light, or sound therapy.   Biofeedback.   Working with a physical therapist to keep from  getting stiff.   Regular, gentle exercise.   Cognitive or behavioral therapy.   Group support.  Sometimes, surgery may be recommended.  HOME CARE INSTRUCTIONS   Take all medicines as directed by your health care provider.   Lessen stress in your life by relaxing and doing things such as listening to calming music.   Exercise or be active as directed by your health care provider.   Eat a healthy diet and include things such as vegetables, fruits, fish, and lean meats in your diet.   Keep all follow-up appointments with your health care provider.   Attend a support group with others suffering from chronic pain. SEEK MEDICAL CARE IF:   Your pain gets worse.   You develop a new pain that was not there before.   You cannot tolerate medicines given to you by your health care provider.   You have new symptoms since your last visit with your health care provider.  SEEK IMMEDIATE MEDICAL CARE IF:   You feel weak.   You have decreased sensation or numbness.   You lose control of bowel or bladder function.   Your pain suddenly gets much worse.   You develop shaking.  You develop chills.  You develop confusion.  You develop chest pain.  You develop shortness of breath.  MAKE SURE YOU:  Understand these instructions.  Will watch your condition.  Will get help right away if you are not doing well or get worse. Document Released: 05/15/2002 Document Revised: 04/25/2013 Document Reviewed: 02/16/2013 [facility] Patient Information 2015 [place], [place]. This information is not intended to replace advice given to you by your health care provider. Make sure you discuss any questions you have with your health care provider.
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
## 2014-11-13 NOTE — ED Notes (Addendum)
Pt states that he is out of his medications because he missed his doctors appointments for various reasons and is having back pain because his electric wheelchair is broken and he is using a manual one. Alert and oriented. Emotional at this time.

## 2014-11-13 NOTE — Progress Notes (Signed)
EDCM spoke to patient at bedside.  Patient confirms [facility] is [facility]  [facility]  [facility]  Patient reports, "I had an appointment today but they called me and cancelled it because they said they scheduled me on a day my doctor was off."  Patient confirms he has Medicaid insurance.  Patient confirms his electric wheelchair is in the shop and has received Medicaid approval to fix both engines, "And they gave me this other wheelchair and it's killing me.  They say it will be two weeks before my chair is fixed."  [facility] offered support reminding patient that this is short term.  Patient appears anxious.  [facility] asked patient if he was receiving any outpatient assistance for his depression, bipolar anxiety and if he would like any information about [facility]?  Patient responded, "No, I need to get my pain fixed before they pick my brain.  If my pain was fixed my brain would be a lot better."  [facility] offered patient support and strongly [facility], informed patient of walk in times.  Patient verbalized understanding.  [facility] provided [facility] with [facility] consulted EDSW.  Discussed patient with EDPA.  No further EDCM needs at this time.

## 2014-11-14 ENCOUNTER — Encounter (HOSPITAL_COMMUNITY): Payer: Self-pay | Admitting: General Practice

## 2014-11-14 ENCOUNTER — Emergency Department (HOSPITAL_COMMUNITY): Payer: Medicaid Other

## 2014-11-14 ENCOUNTER — Emergency Department (HOSPITAL_COMMUNITY)
Admission: EM | Admit: 2014-11-14 | Discharge: 2014-11-14 | Disposition: A | Discharge status: Home / Self Care | Payer: Medicaid Other | Attending: Emergency Medicine | Admitting: Emergency Medicine

## 2014-11-14 DIAGNOSIS — I10 Essential (primary) hypertension: Secondary | ICD-10-CM | POA: Insufficient documentation

## 2014-11-14 DIAGNOSIS — I2510 Atherosclerotic heart disease of native coronary artery without angina pectoris: Secondary | ICD-10-CM | POA: Diagnosis not present

## 2014-11-14 DIAGNOSIS — F329 Major depressive disorder, single episode, unspecified: Secondary | ICD-10-CM | POA: Insufficient documentation

## 2014-11-14 DIAGNOSIS — M1990 Unspecified osteoarthritis, unspecified site: Secondary | ICD-10-CM | POA: Insufficient documentation

## 2014-11-14 DIAGNOSIS — Z79899 Other long term (current) drug therapy: Secondary | ICD-10-CM | POA: Diagnosis not present

## 2014-11-14 DIAGNOSIS — F419 Anxiety disorder, unspecified: Secondary | ICD-10-CM | POA: Diagnosis not present

## 2014-11-14 DIAGNOSIS — M545 Low back pain: Secondary | ICD-10-CM | POA: Insufficient documentation

## 2014-11-14 DIAGNOSIS — G20 Parkinson's disease: Secondary | ICD-10-CM | POA: Insufficient documentation

## 2014-11-14 DIAGNOSIS — R52 Pain, unspecified: Secondary | ICD-10-CM

## 2014-11-14 DIAGNOSIS — G8929 Other chronic pain: Secondary | ICD-10-CM | POA: Insufficient documentation

## 2014-11-14 DIAGNOSIS — Z8619 Personal history of other infectious and parasitic diseases: Secondary | ICD-10-CM | POA: Insufficient documentation

## 2014-11-14 DIAGNOSIS — E780 Pure hypercholesterolemia: Secondary | ICD-10-CM | POA: Diagnosis not present

## 2014-11-14 DIAGNOSIS — Z7982 Long term (current) use of aspirin: Secondary | ICD-10-CM | POA: Diagnosis not present

## 2014-11-14 DIAGNOSIS — Z720 Tobacco use: Secondary | ICD-10-CM | POA: Diagnosis not present

## 2014-11-14 DIAGNOSIS — N390 Urinary tract infection, site not specified: Secondary | ICD-10-CM | POA: Insufficient documentation

## 2014-11-14 LAB — URINE MICROSCOPIC-ADD ON

## 2014-11-14 LAB — I-STAT CHEM 8, ED
BUN: 11 mg/dL (ref 6–23)
CALCIUM ION: 1.05 mmol/L — AB (ref 1.12–1.23)
CREATININE: 0.8 mg/dL (ref 0.50–1.35)
Chloride: 106 mmol/L (ref 96–112)
Glucose, Bld: 93 mg/dL (ref 70–99)
HCT: 50 % (ref 39.0–52.0)
HEMOGLOBIN: 17 g/dL (ref 13.0–17.0)
POTASSIUM: 4 mmol/L (ref 3.5–5.1)
SODIUM: 139 mmol/L (ref 135–145)
TCO2: 19 mmol/L (ref 0–100)

## 2014-11-14 LAB — URINALYSIS, ROUTINE W REFLEX MICROSCOPIC
BILIRUBIN URINE: NEGATIVE
GLUCOSE, UA: NEGATIVE mg/dL
Ketones, ur: NEGATIVE mg/dL
NITRITE: POSITIVE — AB
PH: 6 (ref 5.0–8.0)
PROTEIN: 100 mg/dL — AB
Specific Gravity, Urine: 1.011 (ref 1.005–1.030)
Urobilinogen, UA: 0.2 mg/dL (ref 0.0–1.0)

## 2014-11-14 MED ORDER — OXYCODONE-ACETAMINOPHEN 5-325 MG PO TABS
2.0000 | ORAL_TABLET | Freq: Once | ORAL | Status: AC
  Administered 2014-11-14: 2 via ORAL
  Filled 2014-11-14: qty 2

## 2014-11-14 MED ORDER — TRAMADOL HCL 50 MG PO TABS: 50.0000 mg | ORAL_TABLET | Freq: Four times a day (QID) | ORAL | Status: DC | PRN

## 2014-11-14 MED ORDER — ONDANSETRON 4 MG PO TBDP
4.0000 mg | ORAL_TABLET | Freq: Once | ORAL | Status: AC
  Administered 2014-11-14: 4 mg via ORAL
  Filled 2014-11-14: qty 1

## 2014-11-14 MED ORDER — HYDROMORPHONE HCL 1 MG/ML IJ SOLN
1.0000 mg | Freq: Once | INTRAMUSCULAR | Status: AC
  Administered 2014-11-14: 1 mg via INTRAMUSCULAR
  Filled 2014-11-14: qty 1

## 2014-11-14 MED ORDER — CIPROFLOXACIN HCL 500 MG PO TABS: 500.0000 mg | ORAL_TABLET | Freq: Two times a day (BID) | ORAL | Status: DC

## 2014-11-14 MED ORDER — DEXTROSE 5 % IV SOLN
1.0000 g | Freq: Once | INTRAVENOUS | Status: AC
  Administered 2014-11-14: 1 g via INTRAVENOUS
  Filled 2014-11-14: qty 10

## 2014-11-14 NOTE — Discharge Instructions (Signed)

## 2014-11-14 NOTE — Progress Notes (Signed)
CSW consulted with pt at bedside. Pt states that [facility]. Pt states that he does not have transportation home.   CSW consulted with nurse who states that [facility] will be able to pick the pt up upon discharge and [facility]  Trish MageBrittney Maveryck Bahri, LCSWA [phone] ED CSW 11/14/2014 12:25 AM

## 2014-11-14 NOTE — ED Notes (Signed)
Patient transported to X-ray 

## 2014-11-14 NOTE — ED Notes (Addendum)
Pt brought in via [facility] complaining of chronic back and bilateral leg cramping. Pt rates pain a 9/10. Pt is A/O. Pt is generally in a power wheelchair, the motor on the power chair went out and pt has been using a manual wheelchair. Per patient he was diagnosed for a spastic paraplegic in March of 2014.Pt has not been able to take medications for the past 2 months. EMS V/S B/P 121/80, SPO2 98%, and HR 90.

## 2014-11-14 NOTE — ED Notes (Signed)
Pt. Yelling out upon being moved to A5 that he is having CP. Pt. Placed on 12 lead monitor, BP, and continuous pulse ox.
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
## 2014-11-15 ENCOUNTER — Emergency Department (HOSPITAL_COMMUNITY)
Admission: EM | Admit: 2014-11-15 | Discharge: 2014-11-17 | Disposition: A | Discharge status: Home / Self Care | Payer: Medicaid Other | Attending: Emergency Medicine | Admitting: Emergency Medicine

## 2014-11-15 ENCOUNTER — Emergency Department (HOSPITAL_COMMUNITY)
Admission: EM | Admit: 2014-11-15 | Discharge: 2014-11-15 | Disposition: A | Discharge status: Home / Self Care | Payer: Medicaid Other | Attending: General Surgery | Admitting: General Surgery

## 2014-11-15 ENCOUNTER — Emergency Department (HOSPITAL_COMMUNITY): Payer: Medicaid Other

## 2014-11-15 ENCOUNTER — Other Ambulatory Visit: Payer: Self-pay | Admitting: Emergency Medicine

## 2014-11-15 ENCOUNTER — Encounter (HOSPITAL_COMMUNITY): Payer: Self-pay | Admitting: Emergency Medicine

## 2014-11-15 DIAGNOSIS — I2510 Atherosclerotic heart disease of native coronary artery without angina pectoris: Secondary | ICD-10-CM | POA: Diagnosis not present

## 2014-11-15 DIAGNOSIS — G20 Parkinson's disease: Secondary | ICD-10-CM | POA: Insufficient documentation

## 2014-11-15 DIAGNOSIS — T50901A Poisoning by unspecified drugs, medicaments and biological substances, accidental (unintentional), initial encounter: Secondary | ICD-10-CM

## 2014-11-15 DIAGNOSIS — I10 Essential (primary) hypertension: Secondary | ICD-10-CM | POA: Insufficient documentation

## 2014-11-15 DIAGNOSIS — F419 Anxiety disorder, unspecified: Secondary | ICD-10-CM | POA: Diagnosis not present

## 2014-11-15 DIAGNOSIS — Z792 Long term (current) use of antibiotics: Secondary | ICD-10-CM | POA: Diagnosis not present

## 2014-11-15 DIAGNOSIS — M1990 Unspecified osteoarthritis, unspecified site: Secondary | ICD-10-CM | POA: Diagnosis not present

## 2014-11-15 DIAGNOSIS — Z7982 Long term (current) use of aspirin: Secondary | ICD-10-CM | POA: Insufficient documentation

## 2014-11-15 DIAGNOSIS — R079 Chest pain, unspecified: Secondary | ICD-10-CM | POA: Diagnosis not present

## 2014-11-15 DIAGNOSIS — E780 Pure hypercholesterolemia: Secondary | ICD-10-CM | POA: Diagnosis not present

## 2014-11-15 DIAGNOSIS — F329 Major depressive disorder, single episode, unspecified: Secondary | ICD-10-CM | POA: Insufficient documentation

## 2014-11-15 DIAGNOSIS — G8929 Other chronic pain: Secondary | ICD-10-CM | POA: Insufficient documentation

## 2014-11-15 DIAGNOSIS — Z720 Tobacco use: Secondary | ICD-10-CM | POA: Insufficient documentation

## 2014-11-15 LAB — BASIC METABOLIC PANEL
Anion gap: 14 (ref 5–15)
BUN: 15 mg/dL (ref 6–23)
CHLORIDE: 105 mmol/L (ref 96–112)
CO2: 20 mmol/L (ref 19–32)
Calcium: 9.2 mg/dL (ref 8.4–10.5)
Creatinine, Ser: 0.85 mg/dL (ref 0.50–1.35)
GFR calc non Af Amer: >90 mL/min (ref >90)
GLUCOSE: 91 mg/dL (ref 70–99)
Potassium: 3.7 mmol/L (ref 3.5–5.1)
Sodium: 139 mmol/L (ref 135–145)

## 2014-11-15 LAB — CBC
HCT: 41.2 % (ref 39.0–52.0)
Hemoglobin: 14 g/dL (ref 13.0–17.0)
MCH: 29.4 pg (ref 26.0–34.0)
MCHC: 34 g/dL (ref 30.0–36.0)
MCV: 86.4 fL (ref 78.0–100.0)
Platelets: 230 10*3/uL (ref 150–400)
RBC: 4.77 MIL/uL (ref 4.22–5.81)
RDW: 15.1 % (ref 11.5–15.5)
WBC: 7 10*3/uL (ref 4.0–10.5)

## 2014-11-15 LAB — I-STAT TROPONIN, ED: Troponin i, poc: 0 ng/mL (ref 0.00–0.08)

## 2014-11-15 MED ORDER — ATORVASTATIN CALCIUM 80 MG PO TABS: 80.0000 mg | ORAL_TABLET | Freq: Every day | ORAL | Status: AC

## 2014-11-15 MED ORDER — CARVEDILOL 3.125 MG PO TABS: 3.1250 mg | ORAL_TABLET | Freq: Two times a day (BID) | ORAL | Status: AC

## 2014-11-15 MED ORDER — CYCLOBENZAPRINE HCL 5 MG PO TABS: 5.0000 mg | ORAL_TABLET | Freq: Three times a day (TID) | ORAL | Status: AC | PRN

## 2014-11-15 MED ORDER — GABAPENTIN 100 MG PO CAPS: 100.0000 mg | ORAL_CAPSULE | Freq: Three times a day (TID) | ORAL | Status: AC
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
## 2014-11-15 NOTE — ED Notes (Signed)
Per EMS: [facility] from [facility] with c/o substernal cp that started 1 hour ago, denies any radiation or sob. Pt states hx of MI in past, given 324 mg of aspirin and 2 nitro with minimal relief. NAD noted upon arrival.

## 2014-11-16 DIAGNOSIS — T50901A Poisoning by unspecified drugs, medicaments and biological substances, accidental (unintentional), initial encounter: Secondary | ICD-10-CM

## 2014-11-16 LAB — BASIC METABOLIC PANEL
ANION GAP: 9 (ref 5–15)
BUN: 16 mg/dL (ref 6–23)
CO2: 25 mmol/L (ref 19–32)
CREATININE: 0.92 mg/dL (ref 0.50–1.35)
Calcium: 9.4 mg/dL (ref 8.4–10.5)
Chloride: 107 mmol/L (ref 96–112)
GFR calc non Af Amer: >90 mL/min (ref >90)
Glucose, Bld: 85 mg/dL (ref 70–99)
POTASSIUM: 4.5 mmol/L (ref 3.5–5.1)
SODIUM: 141 mmol/L (ref 135–145)

## 2014-11-16 LAB — HEPATIC FUNCTION PANEL
ALBUMIN: 3.3 g/dL — AB (ref 3.5–5.2)
ALT: 9 U/L (ref 0–53)
AST: 15 U/L (ref 0–37)
Alkaline Phosphatase: 78 U/L (ref 39–117)
BILIRUBIN DIRECT: 0.1 mg/dL (ref 0.0–0.5)
BILIRUBIN INDIRECT: 0.3 mg/dL (ref 0.3–0.9)
Total Bilirubin: 0.4 mg/dL (ref 0.3–1.2)
Total Protein: 6.2 g/dL (ref 6.0–8.3)

## 2014-11-16 LAB — I-STAT TROPONIN, ED: Troponin i, poc: 0.03 ng/mL (ref 0.00–0.08)

## 2014-11-16 LAB — RAPID URINE DRUG SCREEN, HOSP PERFORMED
AMPHETAMINES: NOT DETECTED
BARBITURATES: NOT DETECTED
Benzodiazepines: POSITIVE — AB
Cocaine: NOT DETECTED
Opiates: NOT DETECTED
TETRAHYDROCANNABINOL: NOT DETECTED

## 2014-11-16 LAB — SALICYLATE LEVEL: Salicylate Lvl: <4 mg/dL (ref 2.8–20.0)

## 2014-11-16 LAB — ACETAMINOPHEN LEVEL: Acetaminophen (Tylenol), Serum: <10 ug/mL — ABNORMAL LOW (ref 10–30)

## 2014-11-16 LAB — ETHANOL: ALCOHOL ETHYL (B): 40 mg/dL — AB (ref 0–9)

## 2014-11-16 MED ORDER — LORAZEPAM 1 MG PO TABS: 0.0000 mg | ORAL_TABLET | Freq: Four times a day (QID) | ORAL | Status: DC

## 2014-11-16 MED ORDER — LORAZEPAM 1 MG PO TABS: 0.0000 mg | ORAL_TABLET | Freq: Two times a day (BID) | ORAL | Status: DC

## 2014-11-16 MED ORDER — CARVEDILOL 3.125 MG PO TABS
3.1250 mg | ORAL_TABLET | Freq: Two times a day (BID) | ORAL | Status: DC
  Administered 2014-11-16 (×2): 3.125 mg via ORAL
  Filled 2014-11-16 (×5): qty 1

## 2014-11-16 MED ORDER — ATORVASTATIN CALCIUM 80 MG PO TABS
80.0000 mg | ORAL_TABLET | Freq: Every day | ORAL | Status: DC
  Administered 2014-11-16 – 2014-11-17 (×2): 80 mg via ORAL
  Filled 2014-11-16 (×2): qty 1

## 2014-11-16 MED ORDER — LORAZEPAM 2 MG/ML IJ SOLN
2.0000 mg | Freq: Once | INTRAMUSCULAR | Status: AC
  Administered 2014-11-16: 2 mg via INTRAVENOUS
  Filled 2014-11-16: qty 1

## 2014-11-16 MED ORDER — DIAZEPAM 5 MG/ML IJ SOLN: 5.0000 mg | Freq: Once | INTRAMUSCULAR | Status: DC

## 2014-11-16 MED ORDER — ONDANSETRON HCL 4 MG/2ML IJ SOLN
4.0000 mg | Freq: Once | INTRAMUSCULAR | Status: AC
  Administered 2014-11-16: 4 mg via INTRAVENOUS
  Filled 2014-11-16: qty 2

## 2014-11-16 MED ORDER — NICOTINE 21 MG/24HR TD PT24
21.0000 mg | MEDICATED_PATCH | Freq: Every day | TRANSDERMAL | Status: DC
  Administered 2014-11-16 – 2014-11-17 (×2): 21 mg via TRANSDERMAL
  Filled 2014-11-16 (×3): qty 1

## 2014-11-16 MED ORDER — GABAPENTIN 100 MG PO CAPS
100.0000 mg | ORAL_CAPSULE | Freq: Three times a day (TID) | ORAL | Status: DC
  Administered 2014-11-16 – 2014-11-17 (×5): 100 mg via ORAL
  Filled 2014-11-16 (×6): qty 1

## 2014-11-16 MED ORDER — OLANZAPINE 5 MG PO TABS
10.0000 mg | ORAL_TABLET | Freq: Every day | ORAL | Status: DC
  Administered 2014-11-16: 10 mg via ORAL
  Filled 2014-11-16: qty 2

## 2014-11-16 MED ORDER — NICOTINE 7 MG/24HR TD PT24
7.0000 mg | MEDICATED_PATCH | Freq: Every day | TRANSDERMAL | Status: DC
  Administered 2014-11-16: 7 mg via TRANSDERMAL
  Filled 2014-11-16: qty 1

## 2014-11-16 MED ORDER — VITAMIN B-1 100 MG PO TABS
100.0000 mg | ORAL_TABLET | Freq: Every day | ORAL | Status: DC
  Administered 2014-11-16: 100 mg via ORAL
  Filled 2014-11-16: qty 1

## 2014-11-16 MED ORDER — ACETAMINOPHEN 325 MG PO TABS
650.0000 mg | ORAL_TABLET | ORAL | Status: DC | PRN
  Administered 2014-11-16 (×2): 650 mg via ORAL
  Filled 2014-11-16 (×2): qty 2

## 2014-11-16 MED ORDER — THIAMINE HCL 100 MG/ML IJ SOLN: 100.0000 mg | Freq: Every day | INTRAMUSCULAR | Status: DC

## 2014-11-16 MED ORDER — METHOCARBAMOL 500 MG PO TABS
500.0000 mg | ORAL_TABLET | Freq: Three times a day (TID) | ORAL | Status: DC | PRN
  Administered 2014-11-16 (×2): 500 mg via ORAL
  Filled 2014-11-16 (×3): qty 1

## 2014-11-16 MED ORDER — NICOTINE 21 MG/24HR TD PT24: 21.0000 mg | MEDICATED_PATCH | Freq: Every day | TRANSDERMAL | Status: DC

## 2014-11-16 MED ORDER — DIAZEPAM 5 MG/ML IJ SOLN
5.0000 mg | Freq: Once | INTRAMUSCULAR | Status: AC
  Administered 2014-11-16: 5 mg via INTRAMUSCULAR
  Filled 2014-11-16: qty 2

## 2014-11-16 MED ORDER — RANOLAZINE ER 500 MG PO TB12
500.0000 mg | ORAL_TABLET | Freq: Two times a day (BID) | ORAL | Status: DC
  Administered 2014-11-16 – 2014-11-17 (×3): 500 mg via ORAL
  Filled 2014-11-16 (×4): qty 1

## 2014-11-16 MED ORDER — CHARCOAL ACTIVATED PO LIQD
50.0000 g | Freq: Once | ORAL | Status: DC
  Filled 2014-11-16: qty 240

## 2014-11-16 MED ORDER — HALOPERIDOL LACTATE 5 MG/ML IJ SOLN
5.0000 mg | Freq: Once | INTRAMUSCULAR | Status: AC
  Administered 2014-11-16: 5 mg via INTRAVENOUS
  Filled 2014-11-16: qty 1

## 2014-11-16 MED ORDER — IBUPROFEN 400 MG PO TABS
600.0000 mg | ORAL_TABLET | Freq: Three times a day (TID) | ORAL | Status: DC | PRN
  Administered 2014-11-16 (×3): 600 mg via ORAL
  Filled 2014-11-16 (×6): qty 1

## 2014-11-16 MED ORDER — DIPHENHYDRAMINE HCL 50 MG/ML IJ SOLN
50.0000 mg | Freq: Once | INTRAMUSCULAR | Status: AC
  Administered 2014-11-16: 50 mg via INTRAVENOUS
  Filled 2014-11-16: qty 1
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
## 2014-11-16 NOTE — ED Notes (Addendum)
Patient yelling out again and cursing stating his legs are hurting. Offered tylenol but patient refuses. Dr Denton Lanksteinl is aware of patient and will see him.

## 2014-11-16 NOTE — ED Notes (Signed)
Patient on the call bell requesting pain medication. Patient offered tylenol but he sayes this wont work. Dr Denton Lanksteinl made aware of pt complaint and will address patient.

## 2014-11-16 NOTE — BH Assessment (Signed)
Received notification of TTS consult request. Spoke to Dr. Jolaine Clickavid Yow who said Pt is homeless and presents with pain issues. He became upset when he was not given pain medication and took a small amount of medication stating he was now suicidal. Tele-assessment will be initiated.  Harlin RainFord Ellis Ria CommentWarrick Jr, LPC, [facility] Triage Specialist [phone]

## 2014-11-16 NOTE — ED Provider Notes (Signed)
Pt resting, appears comfortable.  [facility] - awaiting disposition/placement.  Discussed w pt concern for narcotic/medication abuse and overuse, and tx/rehab - as pt appears very comfortable, no apparent acute pain or discomfort, and recent hx/narcotic abuse, do not feel additional narcotic tx is either currently warranted, or in patients best interest.    Cathren LaineKevin Lillie Portner, MD 11/16/14 1251

## 2014-11-16 NOTE — ED Notes (Signed)
Called to pt room. He states he wants something for pain because his legs are cramping. He states his legs cramp "all the time". Patient informed that he has tylenol and ibuprofen ordered for pain. "that aint going to work for me". md made aware of pt complaint. Will offer a muscle relaxer. Patient heard yelling and cursiing.

## 2014-11-16 NOTE — Progress Notes (Signed)
CSW follow up on placement attempts made by previous shift and made new referrals.  [facility]: [facility]: Tiana LoftKathy  [facility]: Brandt Looseniane  [facility]: Nat MathMirana reports to review today  [facility]: [facility]   [facility]: Dorathy DaftKayla [place] [facility]: [place] [facility]: Wandra Mannanameka reported did not have referral but did not have beds today [facility]: SCA[employer] CorporationBob  [facility]  [place]  [facility]    CSW will monitor.  Adelene AmasEdith Gustabo Gordillo, LCSW Disposition Social Worker [phone]

## 2014-11-16 NOTE — ED Notes (Signed)
Sitter walked out of pt. Room with no one to cover her. RN went and sat with pt. Until a sitter could be found

## 2014-11-16 NOTE — ED Notes (Signed)
Patient yelling out and cursing. He has punched the garage door. He continues to refuse non narcotic medications, heat packs,  Ice packs etc.

## 2014-11-16 NOTE — ED Notes (Addendum)
Pt states that he took a bottle of ciprofloaxicin and 4 tramadol, which he had at the bedside. When asked why he took all of these pills, pt states "I'm tired of all of this". Silverio LayYao, MD, Foye ClockKristina, Charge RN and staffing office notified. Pt being changed into red scrubs, and belongings taken out of room.

## 2014-11-16 NOTE — ED Notes (Signed)
Pt states continues to be unable to void. Last time was at approx 0740 this am States has been attempting to use urinal w/no results. States has UTI and took all of Cipro at one time last evening. Bladder scan showed 52ml urine. RN encouraged pt to drink more water. Voiced understanding.

## 2014-11-16 NOTE — ED Notes (Signed)
Patient calling out repeatedly for nicotine.  Explained to patient that the nicotine patch comes from main pharmacy and we will give to him as soon as medication is available.

## 2014-11-16 NOTE — BH Assessment (Signed)
Jeffrey [name], AC at [facility], confirms adult unit is currently at capacity. Contacted the following facilities for placement:  INFORMATION HAS ALREADY BEEN FAXED, AWAITING RESPONSE: [facility], per Millie [facility], per Linda  AT CAPACITY: [facility], per Renita [facility], per Jennifer (No beds until Monday) [facility], per [facility], per Elva [facility], per [facility], per Prisc[facility], per Nancy [facility], per Colleen (No beds until Monday) [facility], per Amy [facility], per Barbara [name] [place], per Melanie (No beds until Monday) [facility], per Malika [place], per [facility], per James [place] [place], per Ron [facility], per Christina [facility], per Barbara [facility], per Rose [name], per Jonah   Kleigh Hoelzer Ellis Bobbie Valletta Jr, LPC, [facility] Triage Specialist [phone] 
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
## 2014-11-16 NOTE — ED Notes (Signed)
Telepsych being performed. 

## 2014-11-16 NOTE — ED Notes (Addendum)
Dr Denton Lanksteinl in to speak to patient about his pain management, discussed with patient that he would not be given narcotics but could have non narcotic medications.

## 2014-11-16 NOTE — ED Notes (Signed)
Pt called out asking for pain medication for his stomach. This RN explained to the pt that he had taken too much medication already, and would not be able to receive any pain medication. This RN explained to the pt that any more pain medication would be harmful to him. Pt states "Do me a favor" in regards to letting his organs fail. Silverio LayYao, MD notified, orders to follow.

## 2014-11-16 NOTE — ED Notes (Signed)
Pt is restless and will not lay still in bed. This RN reattached some of the pt's EKG leads and readjusted the pt's BP cuff.
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
## 2014-11-16 NOTE — ED Notes (Signed)
This RN spoke with Nonie Hoyerenise Mills at [facility]. She recommends giving the pt charcoal to absorb the cipro, which can cause renal failure. She also recommends getting an EKG, checking electrolytes, and the pt needs another acetaminophen level checked four hours after ingestion. She states to watch the pt for nausea and vomiting.

## 2014-11-17 DIAGNOSIS — T404X2A Poisoning by other synthetic narcotics, intentional self-harm, initial encounter: Secondary | ICD-10-CM

## 2014-11-17 DIAGNOSIS — T1491 Suicide attempt: Secondary | ICD-10-CM

## 2014-11-17 DIAGNOSIS — T368X2A Poisoning by other systemic antibiotics, intentional self-harm, initial encounter: Secondary | ICD-10-CM

## 2014-11-17 LAB — URINE CULTURE: Colony Count: >=100000

## 2014-11-17 NOTE — Discharge Instructions (Signed)
Please call your doctor for a followup appointment within 24-48 hours. When you talk to your doctor please let them know that you were seen in the emergency department and have them acquire all of your records so that they can discuss the findings with you and formulate a treatment plan to fully care for your new and ongoing problems. ° °

## 2014-11-17 NOTE — ED Notes (Addendum)
NO SIGNS OR SYMPTOMS OF SELF-INJURIOUS BEHAVIOR NOTED W/PT. PT HAS DISPLAYED CALM, COOPERATIVE DEMEANOR. PT DOES NOT APPEAR TO BE RESPONDING TO INTERNAL STIMULI.

## 2014-11-17 NOTE — Progress Notes (Signed)
CSW met with this 59 y/o, single, Caucasian, male that presents in hospital garb, patient took intentional OD in waiting room of ED.  Patient now states he is feeling better, states he has a Risk manager that is working on finding him permanent housing.  Patient states his power wheelchair stopped working and he has been approved for another, "I am just waiting on the process to work, in the meantime I have a manual wheelchair and it puts more strain on my back causing me to hurt more."  Patient states he is now safe for discharge and ready to go back to the [employer]. Patient denies any current S/I or H/I. Patient will need taxi ride to get to shelter as he cannot ride the bus on his own.  Patient states there will be staff at the [facility] that can assist him in getting out of the cab and to his wheelchair.  Patient also requesting pain clinic referral and bus pass to get to appointment.  CSW available as needed.  [facility] Starlee Corralejo Richardo Priest ED CSW [phone]

## 2014-11-17 NOTE — ED Notes (Signed)
[facility] of "No Harm Contract" [facility].

## 2014-11-17 NOTE — ED Provider Notes (Signed)
Pt has stable chronic pain - [facility] has requested d/c  Eber HongBrian Beaumont Austad, MD 11/17/14 1624

## 2014-11-17 NOTE — ED Notes (Signed)
CONRAD, NP, [facility], ON PHONE W/PT.
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
## 2014-11-19 ENCOUNTER — Telehealth (HOSPITAL_BASED_OUTPATIENT_CLINIC_OR_DEPARTMENT_OTHER): Payer: Self-pay | Admitting: Emergency Medicine

## 2014-11-19 NOTE — Telephone Encounter (Signed)
Post ED Visit - Positive Culture Follow-up  Culture report reviewed by antimicrobial stewardship pharmacist: []  Wes Dulaney, Pharm.D., BCPS [x]  Celedonio MiyamotoJeremy Frens, Pharm.D., BCPS []  Georgina PillionElizabeth Martin, Pharm.D., BCPS []  [place]h Pham, [place]Pharm.D., BCPS, AAHIVP []  Estella HuskMichelle Turner, Pharm.D., BCPS, AAHIVP []  Elder CyphersLorie Poole, [address]Pharm.D., BCPS  Positive urine culture Klebsiella Treated with ciprofloxacin, organism sensitive to the same and no further patient follow-up is required at this time.  Berle MullMiller, Nayelis Bonito 11/19/2014, 12:21 PM

## 2014-11-22 ENCOUNTER — Emergency Department (HOSPITAL_COMMUNITY)
Admission: EM | Admit: 2014-11-22 | Discharge: 2014-11-22 | Disposition: A | Discharge status: Home / Self Care | Payer: Medicaid Other | Attending: Emergency Medicine | Admitting: Emergency Medicine

## 2014-11-22 ENCOUNTER — Encounter (HOSPITAL_COMMUNITY): Payer: Self-pay | Admitting: *Deleted

## 2014-11-22 DIAGNOSIS — E780 Pure hypercholesterolemia: Secondary | ICD-10-CM | POA: Diagnosis not present

## 2014-11-22 DIAGNOSIS — Z720 Tobacco use: Secondary | ICD-10-CM | POA: Insufficient documentation

## 2014-11-22 DIAGNOSIS — Z792 Long term (current) use of antibiotics: Secondary | ICD-10-CM | POA: Insufficient documentation

## 2014-11-22 DIAGNOSIS — Z8619 Personal history of other infectious and parasitic diseases: Secondary | ICD-10-CM | POA: Diagnosis not present

## 2014-11-22 DIAGNOSIS — L559 Sunburn, unspecified: Secondary | ICD-10-CM | POA: Insufficient documentation

## 2014-11-22 DIAGNOSIS — G8929 Other chronic pain: Secondary | ICD-10-CM | POA: Insufficient documentation

## 2014-11-22 DIAGNOSIS — Z79899 Other long term (current) drug therapy: Secondary | ICD-10-CM | POA: Insufficient documentation

## 2014-11-22 DIAGNOSIS — G20 Parkinson's disease: Secondary | ICD-10-CM | POA: Diagnosis not present

## 2014-11-22 DIAGNOSIS — F329 Major depressive disorder, single episode, unspecified: Secondary | ICD-10-CM | POA: Insufficient documentation

## 2014-11-22 DIAGNOSIS — Z7982 Long term (current) use of aspirin: Secondary | ICD-10-CM | POA: Insufficient documentation

## 2014-11-22 DIAGNOSIS — F419 Anxiety disorder, unspecified: Secondary | ICD-10-CM | POA: Diagnosis not present

## 2014-11-22 DIAGNOSIS — Z993 Dependence on wheelchair: Secondary | ICD-10-CM | POA: Insufficient documentation

## 2014-11-22 DIAGNOSIS — I2510 Atherosclerotic heart disease of native coronary artery without angina pectoris: Secondary | ICD-10-CM | POA: Insufficient documentation

## 2014-11-22 DIAGNOSIS — I10 Essential (primary) hypertension: Secondary | ICD-10-CM | POA: Insufficient documentation

## 2014-11-22 DIAGNOSIS — M1990 Unspecified osteoarthritis, unspecified site: Secondary | ICD-10-CM | POA: Diagnosis not present

## 2014-11-22 DIAGNOSIS — M549 Dorsalgia, unspecified: Secondary | ICD-10-CM | POA: Diagnosis present

## 2014-11-22 MED ORDER — OXYCODONE-ACETAMINOPHEN 5-325 MG PO TABS
2.0000 | ORAL_TABLET | Freq: Once | ORAL | Status: AC
  Administered 2014-11-22: 2 via ORAL
  Filled 2014-11-22: qty 2

## 2014-11-22 MED ORDER — OXYCODONE HCL 5 MG PO TABS: 5.0000 mg | ORAL_TABLET | Freq: Four times a day (QID) | ORAL | Status: DC | PRN

## 2014-11-22 MED ORDER — ALOE VESTA 2-N-1 PROTECTIVE EX OINT: TOPICAL_OINTMENT | Freq: Two times a day (BID) | CUTANEOUS | Status: AC | PRN

## 2014-11-22 NOTE — Discharge Instructions (Signed)
Chronic Pain Discharge Instructions  Emergency care providers appreciate that many patients coming to us are in severe pain and we wish to address their pain in the safest, most responsible manner.  It is important to recognize however, that the proper treatment of chronic pain differs from that of the pain of injuries and acute illnesses.  Our goal is to provide quality, safe, personalized care and we thank you for giving us the opportunity to serve you. The use of narcotics and related agents for chronic pain syndromes may lead to additional physical and psychological problems.  Nearly as many people die from prescription narcotics each year as die from car crashes.  Additionally, this risk is increased if such prescriptions are obtained from a variety of sources.  Therefore, only your primary care physician or a pain management specialist is able to safely treat such syndromes with narcotic medications long-term.    Documentation revealing such prescriptions have been sought from multiple sources may prohibit us from providing a refill or different narcotic medication.  Your name may be checked first through the [facility] Controlled Substances Reporting System.  This database is a record of controlled substance medication prescriptions that the patient has received.  This has been established by  [facility] in an effort to eliminate the dangerous, and often life threatening, practice of obtaining multiple prescriptions from different medical providers.   If you have a chronic pain syndrome (i.e. chronic headaches, recurrent back or neck pain, dental pain, abdominal or pelvis pain without a specific diagnosis, or neuropathic pain such as fibromyalgia) or recurrent visits for the same condition without an acute diagnosis, you may be treated with non-narcotics and other non-addictive medicines.  Allergic reactions or negative side effects that may be reported by a patient to such medications will not  typically lead to the use of a narcotic analgesic or other controlled substance as an alternative.   Patients managing chronic pain with a personal physician should have provisions in place for breakthrough pain.  If you are in crisis, you should call your physician.  If your physician directs you to the emergency department, please have the doctor call and speak to our attending physician concerning your care.   When patients come to the Emergency Department (ED) with acute medical conditions in which the Emergency Department physician feels appropriate to prescribe narcotic or sedating pain medication, the physician will prescribe these in very limited quantities.  The amount of these medications will last only until you can see your primary care physician in his/her office.  Any patient who returns to the ED seeking refills should expect only non-narcotic pain medications.   In the event of an acute medical condition exists and the emergency physician feels it is necessary that the patient be given a narcotic or sedating medication -  a responsible adult driver should be present in the room prior to the medication being given by the nurse.   Prescriptions for narcotic or sedating medications that have been lost, stolen or expired will not be refilled in the Emergency Department.    Patients who have chronic pain may receive non-narcotic prescriptions until seen by their primary care physician.  It is every patients personal responsibility to maintain active prescriptions with his or her primary care physician or specialist. Sunburn Sunburn is damage to the skin caused by overexposure to ultraviolet (UV) rays. People with light skin or a fair complexion may be more susceptible to sunburn. Repeated sun exposure causes early skin aging  such as wrinkles and sun spots. It also increases the risk of skin cancer. CAUSES A sunburn is caused by getting too much UV radiation from the sun. SYMPTOMS  Red or  pink skin.  Soreness and swelling.  Pain.  Blisters.  Peeling skin.  Headache, fever, and fatigue if sunburn covers a large area. TREATMENT  Your caregiver may tell you to take certain medicines to lessen inflammation.  Your caregiver may have you use hydrocortisone cream or spray to help with itching and inflammation.  Your caregiver may prescribe an antibiotic cream to use on blisters. HOME CARE INSTRUCTIONS   Avoid further exposure to the sun.  Cool baths and cool compresses may be helpful if used several times per day. Do not apply ice, since this may result in more damage to the skin.  Only take over-the-counter or prescription medicines for pain, discomfort, or fever as directed by your caregiver.  Use aloe or other over-the-counter sunburn creams or gels on your skin. Do not apply these creams or gels on blisters.  Drink enough fluids to keep your urine clear or pale yellow.  Do not break blisters. If blisters break, your caregiver may recommend an antibiotic cream to apply to the affected area. PREVENTION   Try to avoid the sun between 10:00 a.m. and 4:00 p.m. when it is the strongest.  Apply sunscreen at least 30 minutes before exposure to the sun.  Always wear protective hats, clothing, and sunglasses with UV protection.  Avoid medicines, herbs, and foods that increase your sensitivity to sunlight.  Avoid tanning beds. SEEK IMMEDIATE MEDICAL CARE IF:   You have a fever.  Your pain is uncontrolled with medicine.  You start to vomit or have diarrhea.  You feel faint or develop a headache with confusion.  You develop severe blistering.  You have a pus-like (purulent) discharge coming from the blisters.  Your burn becomes more painful and swollen. MAKE SURE YOU:  Understand these instructions.  Will watch your condition.  Will get help right away if you are not doing well or get worse. Document Released: 06/02/2005 Document Revised: 12/18/2012  Document Reviewed: 02/14/2011 [facility] Patient Information 2015 [place], [place]. This information is not intended to replace advice given to you by your health care provider. Make sure you discuss any questions you have with your health care provider.
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
## 2014-11-22 NOTE — ED Notes (Signed)
Pt in stating he has chronic back pain that has been worse recently due to having to push himself around in his wheelchair, states his electric chair is broken, no distress noted, [facility] appointment today at [facility]

## 2014-11-22 NOTE — ED Notes (Signed)
Pt with a hx of chronic back pain.  Pt paraplegic.  Pt sts he is used to having a chair with more padding that broke.  He has been using a replacement in the meantime while waiting on his new chair to arrive.  Pt sts chair is very uncomfortable and working his arms to push himself in another automatic chair is causing his back pain to increase.

## 2014-11-24 ENCOUNTER — Emergency Department (HOSPITAL_COMMUNITY)
Admission: EM | Admit: 2014-11-24 | Discharge: 2014-11-24 | Disposition: A | Discharge status: Home / Self Care | Payer: Medicaid Other | Attending: Emergency Medicine | Admitting: Emergency Medicine

## 2014-11-24 ENCOUNTER — Encounter (HOSPITAL_COMMUNITY): Payer: Self-pay | Admitting: *Deleted

## 2014-11-24 DIAGNOSIS — G8929 Other chronic pain: Secondary | ICD-10-CM | POA: Insufficient documentation

## 2014-11-24 DIAGNOSIS — M159 Polyosteoarthritis, unspecified: Secondary | ICD-10-CM | POA: Diagnosis not present

## 2014-11-24 DIAGNOSIS — F319 Bipolar disorder, unspecified: Secondary | ICD-10-CM | POA: Diagnosis not present

## 2014-11-24 DIAGNOSIS — Z7982 Long term (current) use of aspirin: Secondary | ICD-10-CM | POA: Diagnosis not present

## 2014-11-24 DIAGNOSIS — M545 Low back pain, unspecified: Secondary | ICD-10-CM

## 2014-11-24 DIAGNOSIS — Z8619 Personal history of other infectious and parasitic diseases: Secondary | ICD-10-CM | POA: Diagnosis not present

## 2014-11-24 DIAGNOSIS — E780 Pure hypercholesterolemia: Secondary | ICD-10-CM | POA: Insufficient documentation

## 2014-11-24 DIAGNOSIS — Z79899 Other long term (current) drug therapy: Secondary | ICD-10-CM | POA: Diagnosis not present

## 2014-11-24 DIAGNOSIS — I10 Essential (primary) hypertension: Secondary | ICD-10-CM | POA: Diagnosis not present

## 2014-11-24 DIAGNOSIS — Z8669 Personal history of other diseases of the nervous system and sense organs: Secondary | ICD-10-CM | POA: Insufficient documentation

## 2014-11-24 DIAGNOSIS — Z720 Tobacco use: Secondary | ICD-10-CM | POA: Insufficient documentation

## 2014-11-24 DIAGNOSIS — I2510 Atherosclerotic heart disease of native coronary artery without angina pectoris: Secondary | ICD-10-CM | POA: Insufficient documentation

## 2014-11-24 DIAGNOSIS — F419 Anxiety disorder, unspecified: Secondary | ICD-10-CM | POA: Insufficient documentation

## 2014-11-24 DIAGNOSIS — Z792 Long term (current) use of antibiotics: Secondary | ICD-10-CM | POA: Insufficient documentation

## 2014-11-24 DIAGNOSIS — G20 Parkinson's disease: Secondary | ICD-10-CM | POA: Insufficient documentation

## 2014-11-24 MED ORDER — HYDROMORPHONE HCL 1 MG/ML IJ SOLN
1.0000 mg | Freq: Once | INTRAMUSCULAR | Status: AC
  Administered 2014-11-24: 1 mg via INTRAMUSCULAR
  Filled 2014-11-24: qty 1
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
## 2014-11-24 NOTE — ED Notes (Signed)
[facility] for [facility]

## 2014-11-24 NOTE — Discharge Instructions (Signed)

## 2014-11-25 ENCOUNTER — Other Ambulatory Visit: Payer: Self-pay | Admitting: Internal Medicine

## 2014-11-25 ENCOUNTER — Other Ambulatory Visit: Payer: Self-pay | Admitting: Emergency Medicine

## 2014-11-25 MED ORDER — TRAMADOL HCL 50 MG PO TABS: 50.0000 mg | ORAL_TABLET | Freq: Four times a day (QID) | ORAL | Status: AC | PRN

## 2014-11-25 NOTE — Progress Notes (Signed)
ED CM contacted [facility] regarding follow-up on repair of patient's powered w/c, [facility] will contact patient regarding w/c

## 2014-11-26 ENCOUNTER — Telehealth: Payer: Self-pay | Admitting: Internal Medicine

## 2014-11-26 NOTE — Telephone Encounter (Signed)
Pt is concerned that his appt on the 29th to be evaluated for the RL-12 will be too late because the home where he stays needs to receive this before the 31st of this month, if not he will be kicked out. Please follow up with pt to reassure this will get done or if you feel necessary that we try to schedule him in for an earlier appt. If possible.

## 2014-11-27 ENCOUNTER — Telehealth: Payer: Self-pay | Admitting: Emergency Medicine

## 2014-11-27 NOTE — Telephone Encounter (Signed)
I called [employer] in [place] they advised that the patients forms were faxed and signed for the wheel chair repairs.

## 2014-11-27 NOTE — Telephone Encounter (Signed)
I needed info about the patients power wheel chair repair / what company and their phone number. Staff at [employer] stated they would call back.

## 2014-11-29 ENCOUNTER — Encounter (HOSPITAL_COMMUNITY): Payer: Self-pay | Admitting: Emergency Medicine

## 2014-11-29 ENCOUNTER — Emergency Department (HOSPITAL_COMMUNITY)
Admission: EM | Admit: 2014-11-29 | Discharge: 2014-12-05 | Disposition: A | Discharge status: Nursing Home (Includes ICF) | Payer: Medicaid Other | Attending: Emergency Medicine | Admitting: Emergency Medicine

## 2014-11-29 DIAGNOSIS — E780 Pure hypercholesterolemia: Secondary | ICD-10-CM | POA: Diagnosis not present

## 2014-11-29 DIAGNOSIS — I10 Essential (primary) hypertension: Secondary | ICD-10-CM | POA: Insufficient documentation

## 2014-11-29 DIAGNOSIS — Z720 Tobacco use: Secondary | ICD-10-CM | POA: Insufficient documentation

## 2014-11-29 DIAGNOSIS — F1310 Sedative, hypnotic or anxiolytic abuse, uncomplicated: Secondary | ICD-10-CM | POA: Insufficient documentation

## 2014-11-29 DIAGNOSIS — Z7982 Long term (current) use of aspirin: Secondary | ICD-10-CM | POA: Diagnosis not present

## 2014-11-29 DIAGNOSIS — F332 Major depressive disorder, recurrent severe without psychotic features: Secondary | ICD-10-CM | POA: Diagnosis not present

## 2014-11-29 DIAGNOSIS — F322 Major depressive disorder, single episode, severe without psychotic features: Secondary | ICD-10-CM | POA: Diagnosis not present

## 2014-11-29 DIAGNOSIS — R45851 Suicidal ideations: Secondary | ICD-10-CM

## 2014-11-29 DIAGNOSIS — Z8619 Personal history of other infectious and parasitic diseases: Secondary | ICD-10-CM | POA: Insufficient documentation

## 2014-11-29 DIAGNOSIS — I2510 Atherosclerotic heart disease of native coronary artery without angina pectoris: Secondary | ICD-10-CM | POA: Insufficient documentation

## 2014-11-29 DIAGNOSIS — F1210 Cannabis abuse, uncomplicated: Secondary | ICD-10-CM | POA: Diagnosis not present

## 2014-11-29 DIAGNOSIS — Z792 Long term (current) use of antibiotics: Secondary | ICD-10-CM | POA: Insufficient documentation

## 2014-11-29 DIAGNOSIS — G8929 Other chronic pain: Secondary | ICD-10-CM | POA: Insufficient documentation

## 2014-11-29 DIAGNOSIS — M1990 Unspecified osteoarthritis, unspecified site: Secondary | ICD-10-CM | POA: Insufficient documentation

## 2014-11-29 DIAGNOSIS — F319 Bipolar disorder, unspecified: Secondary | ICD-10-CM | POA: Diagnosis not present

## 2014-11-29 DIAGNOSIS — M545 Low back pain: Secondary | ICD-10-CM | POA: Insufficient documentation

## 2014-11-29 DIAGNOSIS — F329 Major depressive disorder, single episode, unspecified: Secondary | ICD-10-CM

## 2014-11-29 DIAGNOSIS — G20 Parkinson's disease: Secondary | ICD-10-CM | POA: Insufficient documentation

## 2014-11-29 DIAGNOSIS — Z79899 Other long term (current) drug therapy: Secondary | ICD-10-CM | POA: Insufficient documentation

## 2014-11-29 DIAGNOSIS — F419 Anxiety disorder, unspecified: Secondary | ICD-10-CM | POA: Insufficient documentation

## 2014-11-29 DIAGNOSIS — F1110 Opioid abuse, uncomplicated: Secondary | ICD-10-CM | POA: Diagnosis not present

## 2014-11-29 DIAGNOSIS — F32A Depression, unspecified: Secondary | ICD-10-CM

## 2014-11-29 LAB — COMPREHENSIVE METABOLIC PANEL
ALK PHOS: 57 U/L (ref 39–117)
ALT: 13 U/L (ref 0–53)
ANION GAP: 10 (ref 5–15)
AST: 26 U/L (ref 0–37)
Albumin: 4 g/dL (ref 3.5–5.2)
BUN: 21 mg/dL (ref 6–23)
CO2: 20 mmol/L (ref 19–32)
Calcium: 8.5 mg/dL (ref 8.4–10.5)
Chloride: 105 mmol/L (ref 96–112)
Creatinine, Ser: 1.15 mg/dL (ref 0.50–1.35)
GFR calc non Af Amer: 69 mL/min — ABNORMAL LOW (ref >90)
GFR, EST AFRICAN AMERICAN: 80 mL/min — AB (ref >90)
Glucose, Bld: 92 mg/dL (ref 70–99)
Potassium: 3.8 mmol/L (ref 3.5–5.1)
Sodium: 135 mmol/L (ref 135–145)
TOTAL PROTEIN: 7.4 g/dL (ref 6.0–8.3)
Total Bilirubin: 0.6 mg/dL (ref 0.3–1.2)

## 2014-11-29 LAB — CBC WITH DIFFERENTIAL/PLATELET
BASOS ABS: 0 10*3/uL (ref 0.0–0.1)
BASOS PCT: 1 % (ref 0–1)
EOS ABS: 0.1 10*3/uL (ref 0.0–0.7)
EOS PCT: 1 % (ref 0–5)
HEMATOCRIT: 41.3 % (ref 39.0–52.0)
Hemoglobin: 13.9 g/dL (ref 13.0–17.0)
Lymphocytes Relative: 37 % (ref 12–46)
Lymphs Abs: 1.8 10*3/uL (ref 0.7–4.0)
MCH: 29.1 pg (ref 26.0–34.0)
MCHC: 33.7 g/dL (ref 30.0–36.0)
MCV: 86.6 fL (ref 78.0–100.0)
MONO ABS: 0.4 10*3/uL (ref 0.1–1.0)
Monocytes Relative: 8 % (ref 3–12)
Neutro Abs: 2.7 10*3/uL (ref 1.7–7.7)
Neutrophils Relative %: 53 % (ref 43–77)
Platelets: 192 10*3/uL (ref 150–400)
RBC: 4.77 MIL/uL (ref 4.22–5.81)
RDW: 14.7 % (ref 11.5–15.5)
WBC: 5 10*3/uL (ref 4.0–10.5)

## 2014-11-29 LAB — SALICYLATE LEVEL

## 2014-11-29 LAB — ETHANOL: Alcohol, Ethyl (B): 7 mg/dL (ref 0–9)

## 2014-11-29 LAB — ACETAMINOPHEN LEVEL: Acetaminophen (Tylenol), Serum: <10 ug/mL — ABNORMAL LOW (ref 10–30)

## 2014-11-29 MED ORDER — ONDANSETRON HCL 4 MG PO TABS: 4.0000 mg | ORAL_TABLET | Freq: Three times a day (TID) | ORAL | Status: DC | PRN

## 2014-11-29 MED ORDER — NICOTINE 21 MG/24HR TD PT24
21.0000 mg | MEDICATED_PATCH | Freq: Every day | TRANSDERMAL | Status: DC
  Administered 2014-11-30 – 2014-12-05 (×6): 21 mg via TRANSDERMAL
  Filled 2014-11-29 (×6): qty 1
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
## 2014-11-29 NOTE — Progress Notes (Signed)
EDCM received phone call from EDPA requesting social work consult for patient for placement issues. EDCM spoke to EDSW who will see patient.

## 2014-11-29 NOTE — ED Notes (Signed)
Bed: WTR9 Expected date:  Expected time:  Means of arrival:  Comments: EMS 59 yo male with back and buttock pain

## 2014-11-29 NOTE — ED Notes (Signed)
Per EMS[facility], pt c/o back and buttocks pain since earlier today. [facility] and told him he could sleep in lobby in wheel chair. Pt got angry and called EMS. Pt A&Ox4. Pt stepped from wheelchair to stretcher, and stretcher to exam chair.

## 2014-11-29 NOTE — BH Assessment (Signed)
Reviewed ED notes prior to initiating assessment. Pt was assessed on 11-16-14 and inpt recommended at that time. Pt was medically admitted in Feb 2016 due to suicide attempt via drug overdose.   Today pt presented to ED with complaints of back and buttocks pain. He then noted he needed a place to stay and was [facility]. Pt reported if he left ED he would commit suicide. Of note pt is wheel chair bound and has spastic paraplegia.  Assessment to commence shortly.    Clista BernhardtNancy Keldrick Pomplun, [facility] Triage Specialist 11/29/2014 10:44 PM
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
## 2014-11-30 DIAGNOSIS — R45851 Suicidal ideations: Secondary | ICD-10-CM | POA: Insufficient documentation

## 2014-11-30 DIAGNOSIS — F322 Major depressive disorder, single episode, severe without psychotic features: Secondary | ICD-10-CM | POA: Diagnosis present

## 2014-11-30 DIAGNOSIS — F332 Major depressive disorder, recurrent severe without psychotic features: Secondary | ICD-10-CM | POA: Diagnosis not present

## 2014-11-30 LAB — RAPID URINE DRUG SCREEN, HOSP PERFORMED
AMPHETAMINES: NOT DETECTED
Barbiturates: NOT DETECTED
Benzodiazepines: POSITIVE — AB
Cocaine: NOT DETECTED
OPIATES: POSITIVE — AB
Tetrahydrocannabinol: POSITIVE — AB

## 2014-11-30 MED ORDER — ALOE VESTA 2-N-1 PROTECTIVE EX OINT: TOPICAL_OINTMENT | Freq: Two times a day (BID) | CUTANEOUS | Status: DC | PRN

## 2014-11-30 MED ORDER — METHOCARBAMOL 500 MG PO TABS
500.0000 mg | ORAL_TABLET | Freq: Three times a day (TID) | ORAL | Status: DC | PRN
  Administered 2014-11-30 – 2014-12-05 (×9): 500 mg via ORAL
  Filled 2014-11-30 (×9): qty 1

## 2014-11-30 MED ORDER — GABAPENTIN 100 MG PO CAPS
100.0000 mg | ORAL_CAPSULE | Freq: Three times a day (TID) | ORAL | Status: DC
  Administered 2014-11-30: 100 mg via ORAL
  Filled 2014-11-30: qty 1

## 2014-11-30 MED ORDER — DULOXETINE HCL 60 MG PO CPEP
60.0000 mg | ORAL_CAPSULE | Freq: Every day | ORAL | Status: DC
  Administered 2014-11-30 – 2014-12-05 (×6): 60 mg via ORAL
  Filled 2014-11-30 (×6): qty 1

## 2014-11-30 MED ORDER — HYDROCERIN EX CREA
TOPICAL_CREAM | Freq: Two times a day (BID) | CUTANEOUS | Status: DC | PRN
  Filled 2014-11-30: qty 113

## 2014-11-30 MED ORDER — ASPIRIN EC 81 MG PO TBEC
81.0000 mg | DELAYED_RELEASE_TABLET | Freq: Every day | ORAL | Status: DC
  Administered 2014-11-30 – 2014-12-05 (×6): 81 mg via ORAL
  Filled 2014-11-30 (×6): qty 1

## 2014-11-30 MED ORDER — METHOCARBAMOL 500 MG PO TABS
500.0000 mg | ORAL_TABLET | Freq: Three times a day (TID) | ORAL | Status: DC | PRN
  Filled 2014-11-30: qty 1

## 2014-11-30 MED ORDER — GABAPENTIN 100 MG PO CAPS
200.0000 mg | ORAL_CAPSULE | Freq: Three times a day (TID) | ORAL | Status: DC
  Administered 2014-11-30 – 2014-12-05 (×15): 200 mg via ORAL
  Filled 2014-11-30 (×15): qty 2

## 2014-11-30 MED ORDER — CYCLOBENZAPRINE HCL 10 MG PO TABS
5.0000 mg | ORAL_TABLET | Freq: Three times a day (TID) | ORAL | Status: DC | PRN
  Administered 2014-11-30 – 2014-12-05 (×7): 5 mg via ORAL
  Filled 2014-11-30 (×7): qty 1

## 2014-11-30 MED ORDER — RANOLAZINE ER 500 MG PO TB12
500.0000 mg | ORAL_TABLET | Freq: Two times a day (BID) | ORAL | Status: DC
  Administered 2014-11-30 – 2014-12-05 (×11): 500 mg via ORAL
  Filled 2014-11-30 (×13): qty 1

## 2014-11-30 MED ORDER — FENTANYL 25 MCG/HR TD PT72
25.0000 ug | MEDICATED_PATCH | TRANSDERMAL | Status: DC
  Administered 2014-11-30 – 2014-12-03 (×2): 25 ug via TRANSDERMAL
  Filled 2014-11-30 (×2): qty 1

## 2014-11-30 MED ORDER — CARVEDILOL 3.125 MG PO TABS
3.1250 mg | ORAL_TABLET | Freq: Two times a day (BID) | ORAL | Status: DC
  Administered 2014-11-30 – 2014-12-04 (×9): 3.125 mg via ORAL
  Filled 2014-11-30 (×14): qty 1

## 2014-11-30 MED ORDER — OLANZAPINE 10 MG PO TABS
10.0000 mg | ORAL_TABLET | Freq: Every day | ORAL | Status: DC
  Administered 2014-11-30 – 2014-12-04 (×5): 10 mg via ORAL
  Filled 2014-11-30 (×5): qty 1

## 2014-11-30 MED ORDER — ATORVASTATIN CALCIUM 80 MG PO TABS
80.0000 mg | ORAL_TABLET | Freq: Every day | ORAL | Status: DC
  Administered 2014-11-30 – 2014-12-05 (×6): 80 mg via ORAL
  Filled 2014-11-30 (×6): qty 1

## 2014-11-30 MED ORDER — MEXILETINE HCL 200 MG PO CAPS
200.0000 mg | ORAL_CAPSULE | Freq: Two times a day (BID) | ORAL | Status: DC
  Administered 2014-11-30 – 2014-12-05 (×11): 200 mg via ORAL
  Filled 2014-11-30 (×13): qty 1

## 2014-11-30 MED ORDER — AMIODARONE HCL 200 MG PO TABS
400.0000 mg | ORAL_TABLET | Freq: Every day | ORAL | Status: DC
  Administered 2014-11-30 – 2014-12-05 (×6): 400 mg via ORAL
  Filled 2014-11-30 (×6): qty 2

## 2014-11-30 MED ORDER — TRAMADOL HCL 50 MG PO TABS: 50.0000 mg | ORAL_TABLET | Freq: Four times a day (QID) | ORAL | Status: DC | PRN
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
## 2014-11-30 NOTE — BH Assessment (Addendum)
Pt was referred to [facility], [facility], [facility], [facility], [facility], and [facility].  Rollen SoxMary Nyasha Rahilly, MA, LPCA, LCASA Therapeutic Triage Specialist [facility]

## 2014-11-30 NOTE — BH Assessment (Signed)
TTS seeking placement as inpt is recommended. Per [name], [name] not [facility] for [name] due to his mobility concerns. TTS seeking placement: [name], [name], [facility], [name]   [name], [place] Triage Specialist 11/30/2014 12:06 AM

## 2014-11-30 NOTE — ED Notes (Signed)
3 belonging bags placed in locker number 30.

## 2014-12-01 DIAGNOSIS — F332 Major depressive disorder, recurrent severe without psychotic features: Secondary | ICD-10-CM | POA: Insufficient documentation

## 2014-12-01 MED ORDER — OXYCODONE HCL 5 MG PO TABS
10.0000 mg | ORAL_TABLET | ORAL | Status: DC | PRN
  Administered 2014-12-01 – 2014-12-05 (×15): 10 mg via ORAL
  Filled 2014-12-01 (×15): qty 2

## 2014-12-01 MED ORDER — IBUPROFEN 200 MG PO TABS
600.0000 mg | ORAL_TABLET | Freq: Three times a day (TID) | ORAL | Status: DC | PRN
  Administered 2014-12-01 – 2014-12-03 (×3): 600 mg via ORAL
  Filled 2014-12-01 (×3): qty 3

## 2014-12-01 NOTE — ED Notes (Signed)
RN notified MD of c/o pain; Dr. Ethelda ChickJacubowitz in to assess patient's pain.

## 2014-12-01 NOTE — ED Provider Notes (Signed)
Complains of low back pain and bilateral knee pain which she has chronically and takes oxycodone regularly. Patient is alert Glasgow Coma Score 15 appears mildly uncomfortable. Oxycodone ordered  Doug SouSam Lofton [facility], MD 12/01/14 2205

## 2014-12-01 NOTE — ED Notes (Signed)
Patient resting in bed; no s/s of distress noted at this time. Sitter at bedside for safety. Pt states "I wish you could find a doctor out there that would give me some Percocets so I could sleep". Pt informed that he has a muscle relaxer to take at [facility]. Pt states that he would like to have them.
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
## 2014-12-01 NOTE — ED Notes (Signed)
Patient reports chronic pain in lower back and right knee.  He rates his pain as a 7.

## 2014-12-02 DIAGNOSIS — F322 Major depressive disorder, single episode, severe without psychotic features: Secondary | ICD-10-CM | POA: Diagnosis not present

## 2014-12-02 NOTE — ED Notes (Signed)
Social worker Baxter HireKristen made aware of communication with [facility] and will follow up.

## 2014-12-02 NOTE — BH Assessment (Addendum)
[facility] Assessment Progress Note  The following facilities have been contacted in an effort to place this patient, with results as noted:  Beds available, information faxed, decision pending:  [facility]  Ineligible due to adult Medicaid:  [facility]  [facility]:  [facility]  Teigen Bellin, [place] Triage Specialist 12/02/2014 @ 16:10

## 2014-12-02 NOTE — Progress Notes (Signed)
Pt faxed out to state wide assisted living facilities.  [facility] [address] however age may make patient inelligible for placement there.   [facility] due to previous behaviors.   Olga CoasterKristen Roshun Klingensmith, LCSW  Clinical Social Work  [facility] [phone]

## 2014-12-02 NOTE — Progress Notes (Signed)
CSW faxed FL2 information to the following facilities seeking placement:  [facility]/[place] [phone]  [facility]  Also, Chanetta MarshallJimmy who is the Production designer, theatre/television/filmmanager of [facility] states that he has beds available and may be interested in offering the pt a bed depending on the pt interview and medicaid status, and TB results. Chanetta MarshallJimmy states that he is interested in setting up an interview time to meet with pt tomorrow or Wednesday.  Ms.Taylor of [facility] reached out to CSW and states that she is also reviewing the pt's FL2 and would be interested in setting up an interview with pt. However, she states that she has to find someone who will be able to ride with [facility] before she can confirm and interview time. Also, she informed CSW that her sister Randa EvensJoanne will be a great contact.  Jimmy/ [phone] [facility]  Ms.Taylor/ [phone] [facility] [phone]/[phone]    [phone]

## 2014-12-02 NOTE — Progress Notes (Signed)
[facility] [place] - last appt 11/27/14 per EPIC

## 2014-12-02 NOTE — ED Notes (Signed)
Micheal Pearson from [facility] called in reference to patient's status at their facility. The patient does not meet criteria to remain at their facility and if he is discharged without arrangements for a new facility he will nee a FL2. Hart RobinsonsMicheal Pearson can be reached at [phone].
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
## 2014-12-03 ENCOUNTER — Ambulatory Visit: Payer: Self-pay | Admitting: Internal Medicine

## 2014-12-03 DIAGNOSIS — F332 Major depressive disorder, recurrent severe without psychotic features: Secondary | ICD-10-CM | POA: Diagnosis not present

## 2014-12-03 MED ORDER — TUBERCULIN PPD 5 UNIT/0.1ML ID SOLN
5.0000 [IU] | Freq: Once | INTRADERMAL | Status: AC
Start: 2014-12-03 — End: 2014-12-05
  Administered 2014-12-03: 5 [IU] via INTRADERMAL
  Filled 2014-12-03: qty 0.1

## 2014-12-03 NOTE — ED Notes (Signed)
MD at bedside. 

## 2014-12-03 NOTE — ED Notes (Signed)
Gave report, pt moving to ED Margo AyeHall B, sitter at bedside

## 2014-12-03 NOTE — Progress Notes (Addendum)
Pt recommend for inpatient psych treatment, however pt is believed to be SI regarding housing. Pt has been chronically homeless, is wheelchiar bound, and has become to have more difficulty with completing his own ADL's. Local homeless shelter is stating that patient is no longer appropriate to be in shelter due to increased need of assistance with adls. Pt shares that he is open to   CSW [facility] from [facility], Janet Rogers [phone] requesting to complete assessment with patient in the hospital. CSW awaiting return call regarding nurse to assess and time.   CSW received call from [place] [facility] following up with assessment. Facility requesting pasarr number, and tb skin test. Per Chanetta MarshallJimmy a completed skin test to be completed before admission. Facility is requesting to discuss with pt over the phone. Facility has an opening in a shared room. Facility can provide mental health treatment in ALF and access to outpatient provider. [phone]   [facility]. CSW left message for Judene CompanionJoanne Tyalor [phone].   CSw left message for [facility] [phone] who showed interest inpatient.   Olga CoasterKristen Daphney Hopke, LCSW  Clinical Social Work  [facility] [phone]    CSW [name], Ethelene Brownsnthony [phone]. Per Ethelene BrownsAnthony, he would like to assess patient in person on Thursday if possible. CSW and Ethelene Brownsnthony to touch base tomorrow regarding asessment time.   Olga CoasterKristen Gavriel Holzhauer, LCSW  Clinical Social Work  [facility] [phone]

## 2014-12-03 NOTE — Progress Notes (Signed)
Jeffrey [name] and 2 staff members of [facility] presented to hospital to meet with CSW. They did not speak with pt in [facility].  Jeffrey [name] states that due to the pt being in a power chair and unable to walk she will not be able to accept the pt.  Trish MageBrittney Benny Henrie, LCSWA [phone] ED CSW 12/03/2014 8:54 PM

## 2014-12-03 NOTE — Progress Notes (Signed)
CSW spoke with Chanetta MarshallJimmy of [facility]. He called requesting updates regarding the patients TB skin test results. Also, he asked CSW if she could call the [name] and inquire if the pt's power chair is still there.  CSW consulted with nurse who states that the pt's TB skin test should be read by Thursday.   CSW reached back out to [place] and informed him that the expected time for the pt's results from his TB skin test will be Thursday. Also, she informed him of the conversation with representative from [facility].  [facility] representative informed CSW that the the pt's chair is still currently at the facility. However, she states that the pt chair is currently not working.   Trish MageBrittney Keyia Moretto, LCSWA [phone] ED CSW 12/03/2014 5:10 PM
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
## 2014-12-03 NOTE — ED Notes (Signed)
Bed: [facility] Expected date:  Expected time:  Means of arrival:  Comments: TCU 

## 2014-12-04 MED ORDER — FENTANYL 25 MCG/HR TD PT72: 25.0000 ug | MEDICATED_PATCH | TRANSDERMAL | Status: AC

## 2014-12-04 MED ORDER — OXYCODONE HCL 5 MG PO TABS: 5.0000 mg | ORAL_TABLET | Freq: Four times a day (QID) | ORAL | Status: AC | PRN
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
## 2014-12-04 NOTE — ED Notes (Addendum)
Pt transferred to room 26. Pt is alert and oriented. Breathing WNL. Does not appear to be in respiratory distress. VSS. Sitter at bedside.

## 2014-12-04 NOTE — ED Notes (Signed)
Bed: WA26 Expected date:  Expected time:  Means of arrival:  Comments: 
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
## 2014-12-04 NOTE — Progress Notes (Addendum)
[facility] ED CM spoke with Fayrene FearingJames and Baxter HireKristen of [facility] about pt need for manual wheelchair if possible This pt has a motorized wheelchair CM not sure if motorized w/c paid for by Medicaid or when obtained.  Pt scheduled for [facility] to [facility] [address] [place], [place] [address]. Pending Check by [facility] staff to see if possible chance pt able to get another  1458 Updated [facility] ED SW  1459 called Shanda BumpsJessica [phone] and left a voice message inquiring if a [facility] staff was assisting pt in getting a new motorized w/c if so to call [facility] ED CM or SW to assist with pt d/c to facility Pending return call  1501 updated ed sw 1510 Cm received a call back from LillieKristen of [facility] to confirm this pt last medicaid purchased light wt w/c was received in 2014. [facility] 2[facility]3 [facility] therefore is not eligible to receive another w/c until after 2017+ ED SW updated

## 2014-12-04 NOTE — Progress Notes (Signed)
CSW faxed completed fl2 with medications, rx for controlled, to [facility] to DennisJimmy to [phone]. RN CM calling to determine if patient eligible for a new wheelchair as he shares taht current manual wheelchair is too small. [facility] manual [facility]. Pt new ALF to pick up patient and [facility] tomorrow. Anticipated time of arrival is at 11am. ALF is requesting pt tb skin test to be read tomorrow morning prior to their drive to pick up patient.   Olga CoasterKristen Elhadj Girton, LCSW  Clinical Social Work  [facility] [phone]

## 2014-12-04 NOTE — Progress Notes (Signed)
Jeffrey [name] reached out to CSW and requested updated medication list.   CSW faxed the requested document to Jeffrey/Owner.  [name], LCSWA [phone] ED CSW 12/04/2014 11:12 PM

## 2014-12-05 NOTE — Progress Notes (Addendum)
Pt accepted to [facility] [place]. Jimmy Medical laboratory scientific officeropeartions manager, picked up patietn and provided transport. Pt and alf retrieving [facility]. Pt left with additonal wheelchair provided by CSW and CM. Patient denies SI/HI/AH/VH. Patient fl2, rx, pasarr, and AVS given to ALF. No further Clinical Social Work needs, signing off.      Olga CoasterKristen Peniel Hass, LCSW  Clinical Social Work  [facility] [phone]

## 2014-12-05 NOTE — Progress Notes (Addendum)
[facility] ED CM able to find and obtain pt a wider w/c to used [place]. [facility] [address], [place], [place] [address] Pt pleased with w/c. Identifying information on w/c states [facility] on back of backrest cover, Tracer IV on side bars and Invacare IV pole attachment and on side bars
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
## 2014-12-05 NOTE — ED Notes (Signed)
TB skin test read and it is negative with 0 mm induration

## 2016-12-13 IMAGING — DX DG CHEST 2V
4 series · 4 of 4 positions shown · non-contrast
Comparison: 08/31/2014.

CLINICAL DATA: Severe chest pain with weakness and shortness of
breath.

EXAM:
CHEST  2 VIEW

[chest lat (1 of 2)]
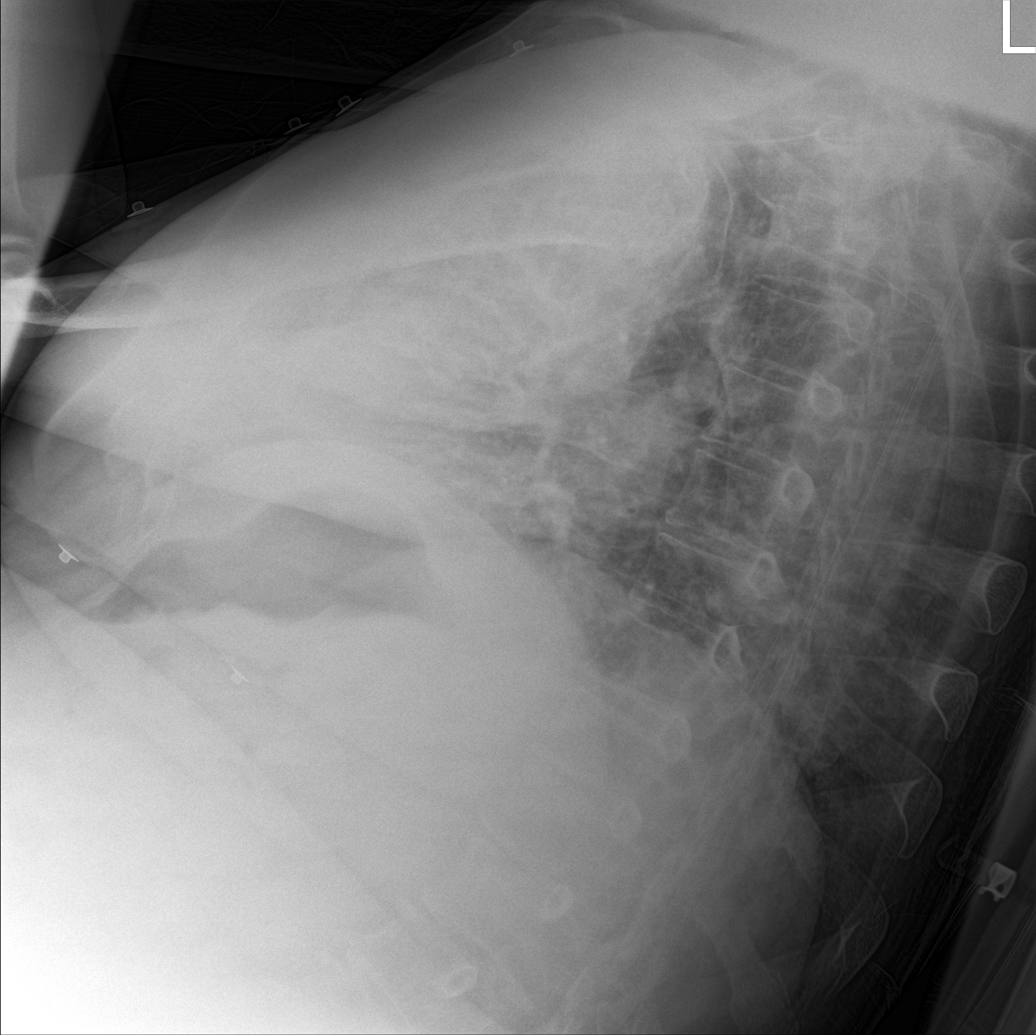

[chest ap (1 of 2)]
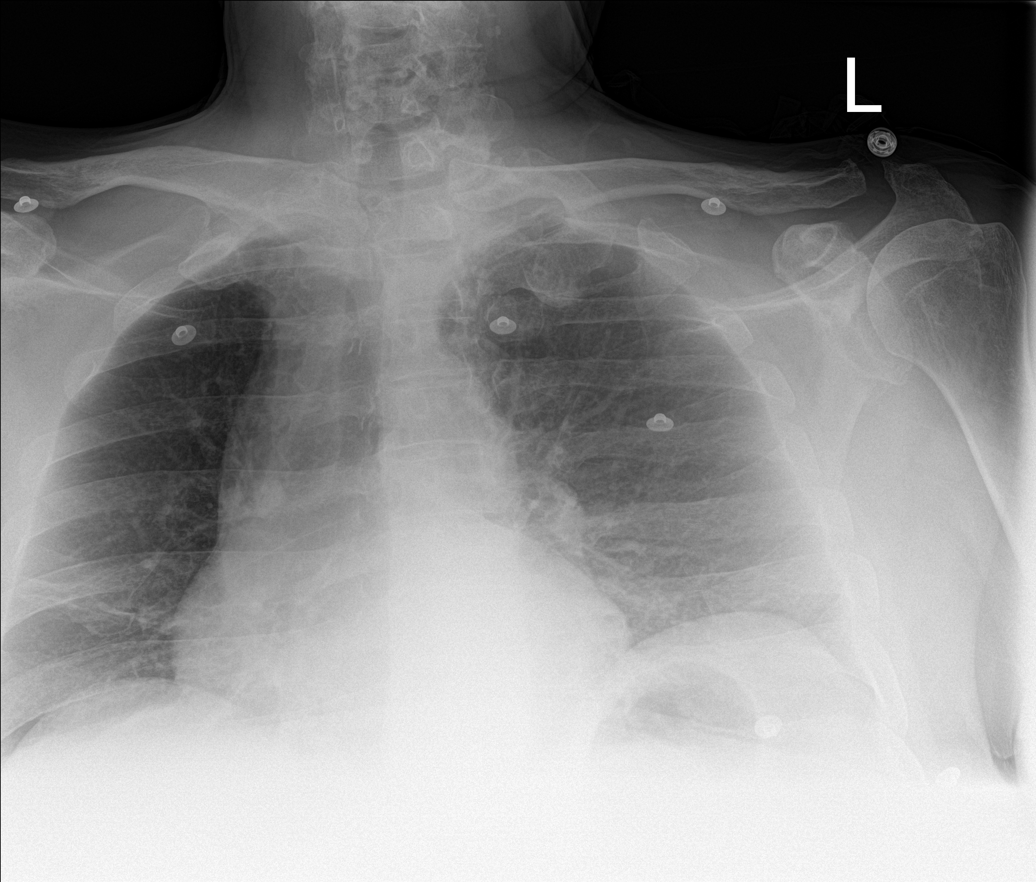

[chest ap (2 of 2)]
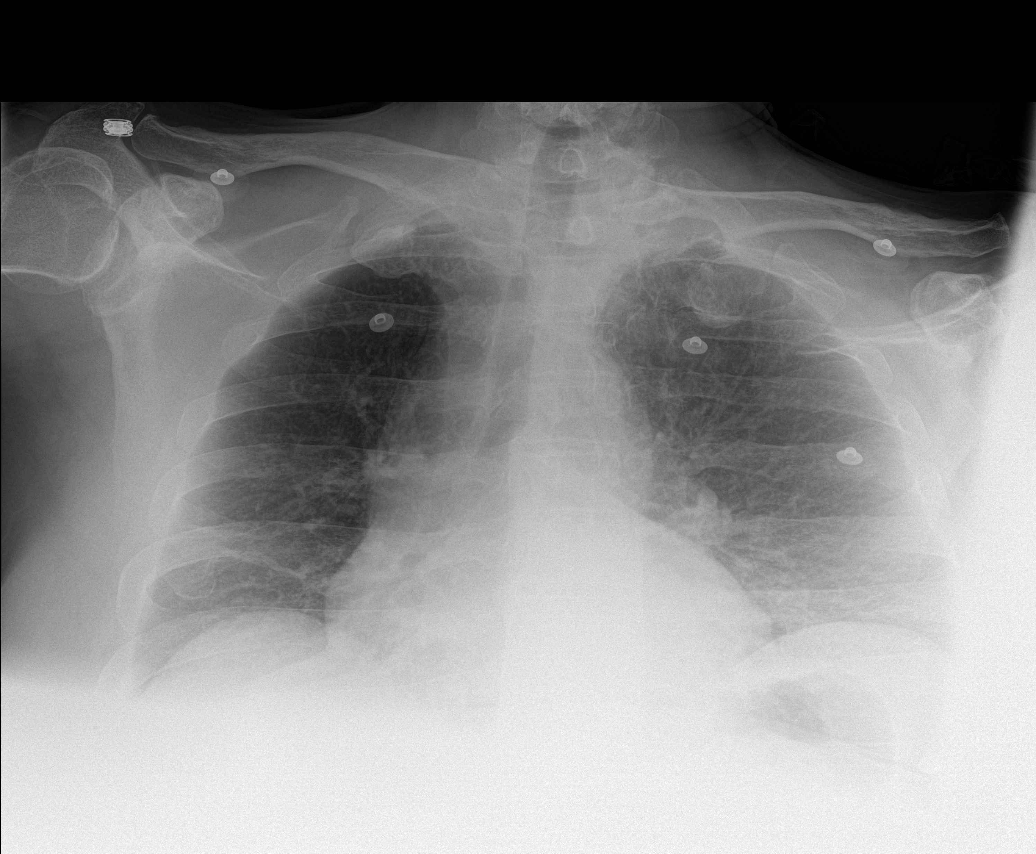

[chest lat (2 of 2)]
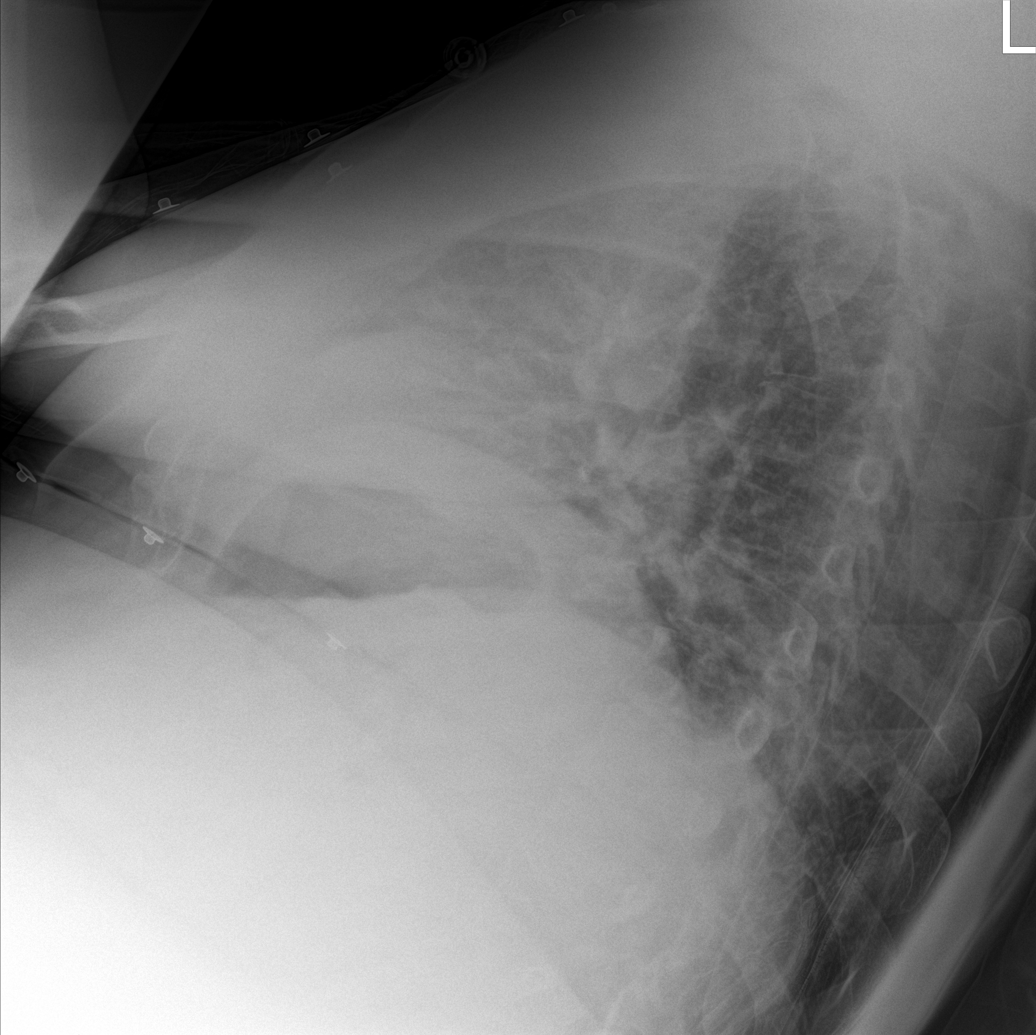

[4 of 4 positions shown; findings below may reference images not displayed]

FINDINGS: Patient is slightly rotated. View is apical lordotic. Trachea is
grossly midline. Heart size stable. Minimal linear scarring at the
right lung base. Lungs are otherwise clear. No pleural fluid.
IMPRESSION: No acute findings.

## 2016-12-27 IMAGING — DX DG CHEST 2V
3 series · 3 of 3 positions shown · non-contrast
Comparison: 10/12/2014

CLINICAL DATA: Pt states he started having left sided chest this am
around 3233. Denies n/v. HTN, smoker, hx CAD, hep C.

EXAM:
CHEST  2 VIEW

[chest pa]
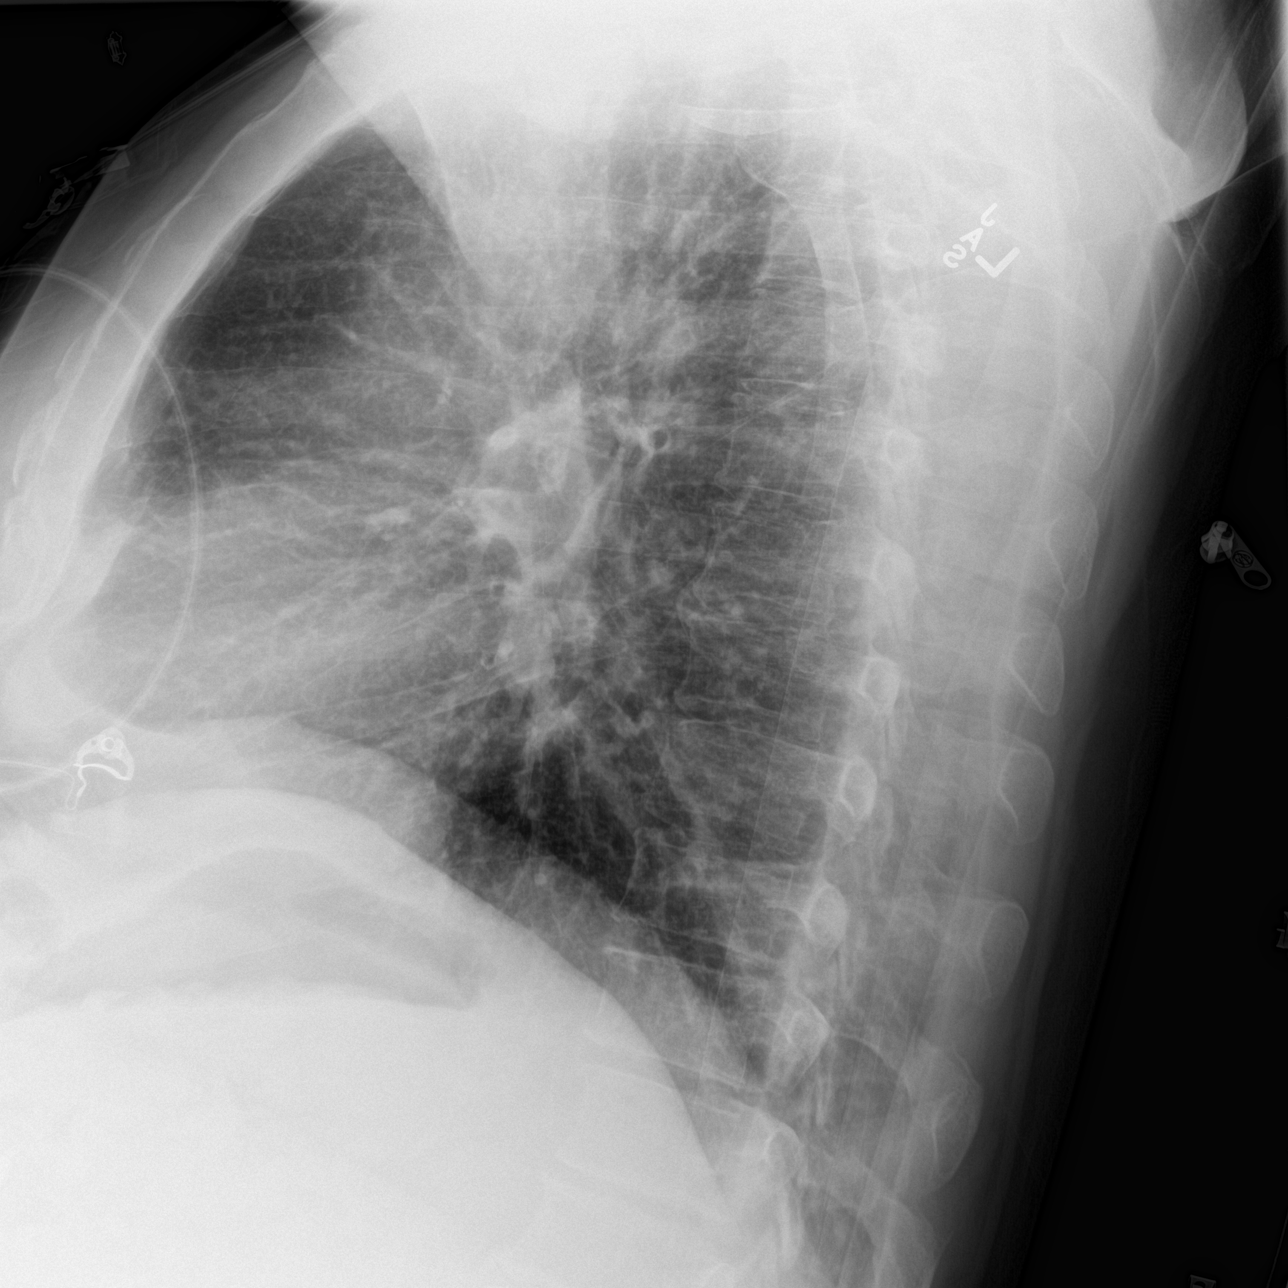

[chest ap (1 of 2)]
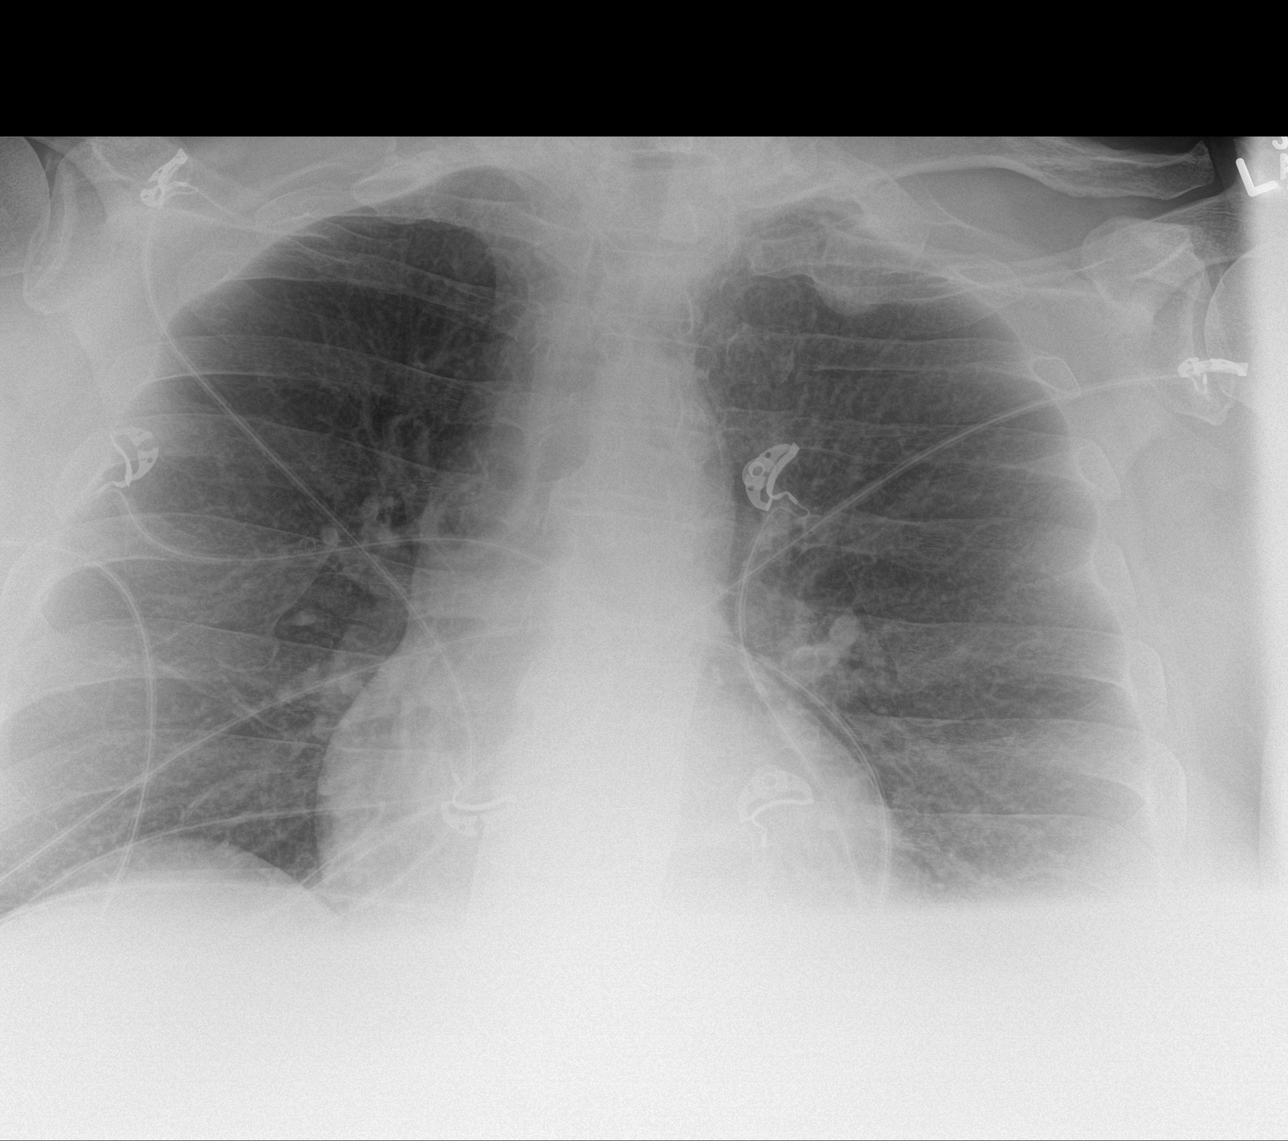

[chest ap (2 of 2)]
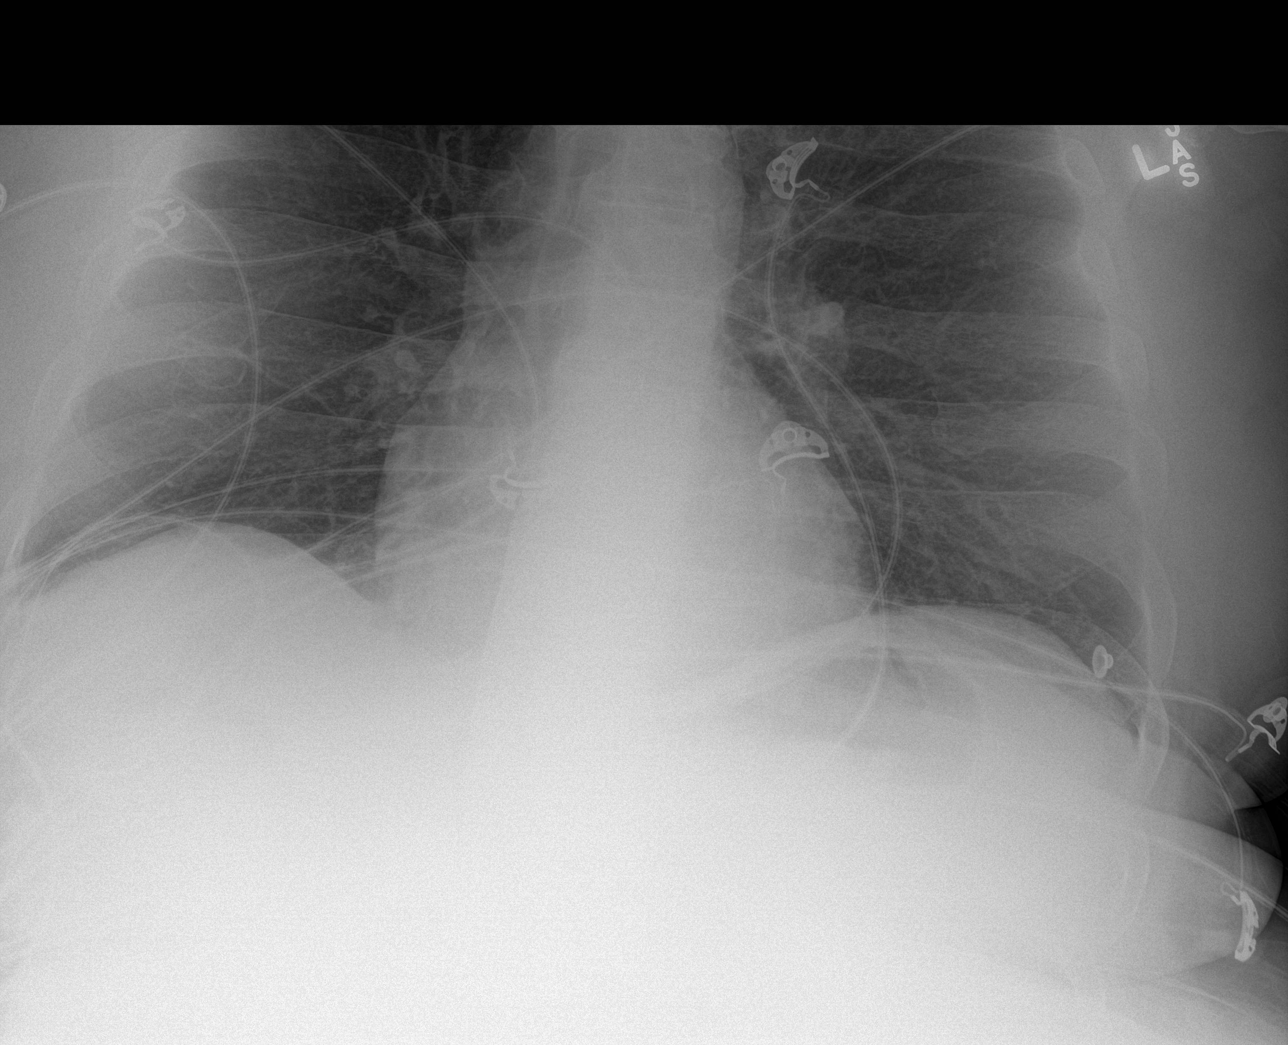

[3 of 3 positions shown; findings below may reference images not displayed]

FINDINGS: Cardiac silhouette is normal in size. No mediastinal or hilar masses
or evidence of adenopathy.

Lungs are clear.  No pleural effusion or pneumothorax.

Bony thorax is intact.
IMPRESSION: No acute cardiopulmonary disease.

## 2019-11-05 DEATH — deceased
